# Patient Record
Sex: Female | Born: 1945 | Race: White | Hispanic: No | Smoking: Former smoker
Health system: Southern US, Community
[De-identification: ages and names within clinical notes are randomized; demographics above are authoritative.]

## PROBLEM LIST (undated history)

## (undated) DIAGNOSIS — Z780 Asymptomatic menopausal state: Secondary | ICD-10-CM

## (undated) DIAGNOSIS — R32 Unspecified urinary incontinence: Secondary | ICD-10-CM

## (undated) DIAGNOSIS — E039 Hypothyroidism, unspecified: Secondary | ICD-10-CM

## (undated) DIAGNOSIS — G459 Transient cerebral ischemic attack, unspecified: Secondary | ICD-10-CM

## (undated) DIAGNOSIS — F329 Major depressive disorder, single episode, unspecified: Secondary | ICD-10-CM

## (undated) DIAGNOSIS — I10 Essential (primary) hypertension: Secondary | ICD-10-CM

## (undated) DIAGNOSIS — E871 Hypo-osmolality and hyponatremia: Secondary | ICD-10-CM

## (undated) DIAGNOSIS — F419 Anxiety disorder, unspecified: Secondary | ICD-10-CM

## (undated) DIAGNOSIS — M1990 Unspecified osteoarthritis, unspecified site: Secondary | ICD-10-CM

## (undated) DIAGNOSIS — J189 Pneumonia, unspecified organism: Secondary | ICD-10-CM

## (undated) DIAGNOSIS — I951 Orthostatic hypotension: Secondary | ICD-10-CM

## (undated) DIAGNOSIS — N182 Chronic kidney disease, stage 2 (mild): Secondary | ICD-10-CM

## (undated) DIAGNOSIS — I639 Cerebral infarction, unspecified: Secondary | ICD-10-CM

## (undated) DIAGNOSIS — E538 Deficiency of other specified B group vitamins: Secondary | ICD-10-CM

## (undated) DIAGNOSIS — E785 Hyperlipidemia, unspecified: Secondary | ICD-10-CM

## (undated) DIAGNOSIS — F1020 Alcohol dependence, uncomplicated: Secondary | ICD-10-CM

## (undated) DIAGNOSIS — F32A Depression, unspecified: Secondary | ICD-10-CM

## (undated) DIAGNOSIS — R0602 Shortness of breath: Secondary | ICD-10-CM

## (undated) DIAGNOSIS — J449 Chronic obstructive pulmonary disease, unspecified: Secondary | ICD-10-CM

## (undated) DIAGNOSIS — I6529 Occlusion and stenosis of unspecified carotid artery: Secondary | ICD-10-CM

## (undated) DIAGNOSIS — K56609 Unspecified intestinal obstruction, unspecified as to partial versus complete obstruction: Secondary | ICD-10-CM

## (undated) DIAGNOSIS — R51 Headache: Secondary | ICD-10-CM

## (undated) DIAGNOSIS — D649 Anemia, unspecified: Secondary | ICD-10-CM

## (undated) HISTORY — PX: TONSILLECTOMY: SUR1361

## (undated) HISTORY — DX: Hypothyroidism, unspecified: E03.9

## (undated) HISTORY — DX: Unspecified osteoarthritis, unspecified site: M19.90

## (undated) HISTORY — DX: Essential (primary) hypertension: I10

## (undated) HISTORY — DX: Asymptomatic menopausal state: Z78.0

## (undated) HISTORY — DX: Transient cerebral ischemic attack, unspecified: G45.9

## (undated) HISTORY — PX: APPENDECTOMY: SHX54

## (undated) HISTORY — DX: Hyperlipidemia, unspecified: E78.5

## (undated) HISTORY — DX: Deficiency of other specified B group vitamins: E53.8

## (undated) HISTORY — DX: Cerebral infarction, unspecified: I63.9

---

## 1999-07-19 ENCOUNTER — Other Ambulatory Visit: Admission: RE | Admit: 1999-07-19 | Discharge: 1999-07-19 | Payer: Self-pay

## 2000-07-24 ENCOUNTER — Other Ambulatory Visit: Admission: RE | Admit: 2000-07-24 | Discharge: 2000-07-24 | Payer: Self-pay | Admitting: Family Medicine

## 2001-07-30 ENCOUNTER — Other Ambulatory Visit: Admission: RE | Admit: 2001-07-30 | Discharge: 2001-07-30 | Payer: Self-pay | Admitting: Family Medicine

## 2002-09-23 ENCOUNTER — Other Ambulatory Visit: Admission: RE | Admit: 2002-09-23 | Discharge: 2002-09-23 | Payer: Self-pay | Admitting: Family Medicine

## 2003-09-18 ENCOUNTER — Ambulatory Visit (HOSPITAL_COMMUNITY): Admission: RE | Admit: 2003-09-18 | Discharge: 2003-09-18 | Payer: Self-pay | Admitting: Neurology

## 2003-09-18 ENCOUNTER — Encounter: Payer: Self-pay | Admitting: Neurology

## 2003-09-29 ENCOUNTER — Other Ambulatory Visit: Admission: RE | Admit: 2003-09-29 | Discharge: 2003-09-29 | Payer: Self-pay | Admitting: Family Medicine

## 2004-10-04 ENCOUNTER — Other Ambulatory Visit: Admission: RE | Admit: 2004-10-04 | Discharge: 2004-10-04 | Payer: Self-pay | Admitting: Family Medicine

## 2005-10-17 ENCOUNTER — Other Ambulatory Visit: Admission: RE | Admit: 2005-10-17 | Discharge: 2005-10-17 | Payer: Self-pay | Admitting: Family Medicine

## 2006-12-21 ENCOUNTER — Other Ambulatory Visit: Admission: RE | Admit: 2006-12-21 | Discharge: 2006-12-21 | Payer: Self-pay | Admitting: Family Medicine

## 2007-04-12 ENCOUNTER — Other Ambulatory Visit: Admission: RE | Admit: 2007-04-12 | Discharge: 2007-04-12 | Payer: Self-pay | Admitting: Family Medicine

## 2008-12-11 DIAGNOSIS — I639 Cerebral infarction, unspecified: Secondary | ICD-10-CM

## 2008-12-11 HISTORY — DX: Cerebral infarction, unspecified: I63.9

## 2009-02-02 ENCOUNTER — Ambulatory Visit (HOSPITAL_COMMUNITY): Admission: RE | Admit: 2009-02-02 | Discharge: 2009-02-02 | Payer: Self-pay | Admitting: Family Medicine

## 2009-03-04 ENCOUNTER — Encounter (INDEPENDENT_AMBULATORY_CARE_PROVIDER_SITE_OTHER): Payer: Self-pay | Admitting: Neurology

## 2009-03-04 ENCOUNTER — Ambulatory Visit: Payer: Self-pay

## 2011-04-21 ENCOUNTER — Encounter: Payer: Self-pay | Admitting: Family Medicine

## 2012-01-08 ENCOUNTER — Other Ambulatory Visit: Payer: Self-pay | Admitting: Family Medicine

## 2012-01-08 ENCOUNTER — Inpatient Hospital Stay (HOSPITAL_COMMUNITY)
Admission: EM | Admit: 2012-01-08 | Discharge: 2012-01-17 | DRG: 316 | Disposition: A | Discharge status: Home / Self Care | Payer: BC Managed Care – PPO | Source: Ambulatory Visit | Attending: Internal Medicine | Admitting: Internal Medicine

## 2012-01-08 ENCOUNTER — Encounter (HOSPITAL_COMMUNITY): Payer: Self-pay | Admitting: *Deleted

## 2012-01-08 DIAGNOSIS — I10 Essential (primary) hypertension: Secondary | ICD-10-CM | POA: Diagnosis present

## 2012-01-08 DIAGNOSIS — N179 Acute kidney failure, unspecified: Principal | ICD-10-CM | POA: Diagnosis present

## 2012-01-08 DIAGNOSIS — E872 Acidosis, unspecified: Secondary | ICD-10-CM | POA: Diagnosis present

## 2012-01-08 DIAGNOSIS — G459 Transient cerebral ischemic attack, unspecified: Secondary | ICD-10-CM | POA: Diagnosis not present

## 2012-01-08 DIAGNOSIS — F1010 Alcohol abuse, uncomplicated: Secondary | ICD-10-CM | POA: Diagnosis present

## 2012-01-08 DIAGNOSIS — Z23 Encounter for immunization: Secondary | ICD-10-CM

## 2012-01-08 DIAGNOSIS — Z9089 Acquired absence of other organs: Secondary | ICD-10-CM

## 2012-01-08 DIAGNOSIS — F1020 Alcohol dependence, uncomplicated: Secondary | ICD-10-CM | POA: Diagnosis present

## 2012-01-08 DIAGNOSIS — Z888 Allergy status to other drugs, medicaments and biological substances status: Secondary | ICD-10-CM

## 2012-01-08 DIAGNOSIS — R110 Nausea: Secondary | ICD-10-CM | POA: Diagnosis present

## 2012-01-08 DIAGNOSIS — D649 Anemia, unspecified: Secondary | ICD-10-CM | POA: Diagnosis present

## 2012-01-08 DIAGNOSIS — R911 Solitary pulmonary nodule: Secondary | ICD-10-CM

## 2012-01-08 DIAGNOSIS — E538 Deficiency of other specified B group vitamins: Secondary | ICD-10-CM | POA: Diagnosis present

## 2012-01-08 DIAGNOSIS — E039 Hypothyroidism, unspecified: Secondary | ICD-10-CM | POA: Diagnosis present

## 2012-01-08 DIAGNOSIS — E785 Hyperlipidemia, unspecified: Secondary | ICD-10-CM | POA: Diagnosis present

## 2012-01-08 DIAGNOSIS — N19 Unspecified kidney failure: Secondary | ICD-10-CM

## 2012-01-08 DIAGNOSIS — E861 Hypovolemia: Secondary | ICD-10-CM | POA: Diagnosis present

## 2012-01-08 DIAGNOSIS — Z87891 Personal history of nicotine dependence: Secondary | ICD-10-CM

## 2012-01-08 DIAGNOSIS — J22 Unspecified acute lower respiratory infection: Secondary | ICD-10-CM

## 2012-01-08 DIAGNOSIS — E871 Hypo-osmolality and hyponatremia: Secondary | ICD-10-CM | POA: Diagnosis present

## 2012-01-08 DIAGNOSIS — Z8673 Personal history of transient ischemic attack (TIA), and cerebral infarction without residual deficits: Secondary | ICD-10-CM

## 2012-01-08 HISTORY — DX: Depression, unspecified: F32.A

## 2012-01-08 HISTORY — DX: Anxiety disorder, unspecified: F41.9

## 2012-01-08 HISTORY — DX: Headache: R51

## 2012-01-08 HISTORY — DX: Major depressive disorder, single episode, unspecified: F32.9

## 2012-01-08 HISTORY — DX: Shortness of breath: R06.02

## 2012-01-08 HISTORY — DX: Cerebral infarction, unspecified: I63.9

## 2012-01-08 HISTORY — DX: Pneumonia, unspecified organism: J18.9

## 2012-01-08 HISTORY — DX: Anemia, unspecified: D64.9

## 2012-01-08 NOTE — ED Provider Notes (Signed)
History     CSN: [id number]  Arrival date & time 01/08/12  2234   First MD Initiated Contact with Patient 01/08/12 2330      Chief Complaint  Patient presents with  . Pneumonia    (Consider location/radiation/quality/duration/timing/severity/associated sxs/prior treatment)  Patient is a 66 y.o. female presenting with pneumonia. The history is provided by the patient.  Pneumonia  Patient was diagnosed with pneumonia 1 week ago today and started on Levaquin, Hycodan, Albuterol inhaler, and Symbicort. She states that the cough and wheezing has improved since. She is complaining tonight of generalized body aches all over, headache, and insomnia. She denies any nausea, vomiting, and abdominal pain now. She has had some vomiting and nausea at home earlier today. She is unable to take the Hycodan due to nausea and vomiting. Her son is worried that she could be dehydrated due to poor PO intake. She denies fever, sore throat, urinary symptoms, rash, and shortness of breath. She is a former smoker 25+ pack years. She has never had pneumonia before now. She reports alcohol use of wine and liquor daily. She quit drinking 1 week ago when she was diagnosed with pneumonia mostly because she wasn't able to keep anything down. She reports having 4-8 drinks daily, usually vodka and ginger ale.    Past Medical History  Diagnosis Date  . Hypertension   . Menopause   . Hypothyroid   . Mini stroke   . Hyperlipidemia   . B12 deficiency     Past Surgical History  Procedure Date  . Appendectomy   . Tonsillectomy     History reviewed. No pertinent family history.  History  Substance Use Topics  . Smoking status: Not on file  . Smokeless tobacco: Not on file  . Alcohol Use: Not on file    OB History    Grav Para Term Preterm Abortions TAB SAB Ect Mult Living                  Review of Systems All pertinent positives and negatives reviewed in the history of present illness  Allergies    Norvasc and Procardia  Home Medications   Current Outpatient Rx  Name Route Sig Dispense Refill  . ACETAMINOPHEN 325 MG PO TABS Oral Take 325 mg by mouth every 6 (six) hours as needed. For pain    . ALBUTEROL SULFATE HFA 108 (90 BASE) MCG/ACT IN AERS Inhalation Inhale 2 puffs into the lungs 2 (two) times daily as needed. For wheezing    . BUDESONIDE-FORMOTEROL FUMARATE 80-4.5 MCG/ACT IN AERO Inhalation Inhale 1 puff into the lungs daily.    . DULOXETINE HCL 60 MG PO CPEP Oral Take 60 mg by mouth daily.     Marland Kitchen EST ESTROGENS-METHYLTEST 0.625-1.25 MG PO TABS Oral Take 1 tablet by mouth daily.    Marland Kitchen HYDROCODONE-HOMATROPINE 5-1.5 MG/5ML PO SYRP Oral Take 5 mLs by mouth every 6 (six) hours as needed. For cough    . LEVOFLOXACIN 750 MG PO TABS Oral Take 750 mg by mouth daily.    Marland Kitchen LISINOPRIL 40 MG PO TABS Oral Take 40 mg by mouth daily.     Marland Kitchen PROBIOTIC PO Oral Take 1 capsule by mouth 2 (two) times daily.    Marland Kitchen ROSUVASTATIN CALCIUM 20 MG PO TABS Oral Take 20 mg by mouth daily.      BP 146/87  Pulse 94  Temp(Src) 97.8 F (36.6 C) (Oral)  Resp 20  SpO2 100%  Physical Exam  Constitutional:  She is oriented to person, place, and time. She appears well-developed and well-nourished. No distress.  HENT:  Head: Normocephalic and atraumatic.  Eyes: EOM are normal. Pupils are equal, round, and reactive to light.  Neck: Normal range of motion. Neck supple.  Cardiovascular: Normal rate, regular rhythm, normal heart sounds and intact distal pulses.   Pulmonary/Chest: Effort normal and breath sounds normal. No respiratory distress. She has no wheezes.  Abdominal: Soft. Bowel sounds are normal. There is no tenderness.  Genitourinary: Rectum normal. Rectal exam shows no mass and no tenderness. Guaiac negative stool.  Musculoskeletal: Normal range of motion.  Neurological: She is alert and oriented to person, place, and time. She displays tremor.       Fine tremor present  Skin: Skin is warm and dry. She  is not diaphoretic.  Psychiatric: She has a normal mood and affect. Her behavior is normal.    ED Course  Procedures (including critical care time)    Dg Chest 2 View  01/09/2012  *RADIOLOGY REPORT*  Clinical Data: Cough, body pain, weakness, recent pneumonia  CHEST - 2 VIEW  Comparison: None  Findings: Normal heart size, mediastinal contours, and pulmonary vascularity. Emphysematous minimal bronchitic changes. Poorly defined density at lateral right apex may represent pleural thickening or scarring but nodule is not excluded. Remaining lungs clear. No acute infiltrate, pleural effusion or pneumothorax. Bones appear demineralized.  IMPRESSION: Emphysematous and minimal bronchitic changes. No definite acute infiltrate. Question scarring at right apex though nodule is not completely excluded; if the patient has prior outside chest radiographs, these would be of benefit to establish stability of this finding. In the absence of prior studies, follow-up noncontrast CT chest would be recommended to exclude pulmonary nodule.  Original Report Authenticated By: Lollie Marrow, M.D.    The patient was recently some drinking about one week ago where she was having 4-8 drinks per day.  She states she sits nausea and vomiting since that time along with the pneumonia was diagnosed by her primary care doctor.  The patient is stable here in the emergency department as far as her mental status.  He does have some mild shaking noted on exam.  Patient states she has generalized bodyaches.  She denies hallucinations.  She also denies any syncope.  I will give the patient an antibiotic here along with some Ativan to help control these mild symptoms that she is having at this time.    She will need admission to the hospital for multiple abnormalities on her metabolic panel.  She was given the plan and voices an understanding at this time. MDM  MDM Reviewed: nursing note and vitals Interpretation: labs and x-ray      Date: 01/09/2012  Rate: 90  Rhythm: normal sinus rhythm  QRS Axis: normal  Intervals: normal  ST/T Wave abnormalities: normal  Conduction Disutrbances:none  Narrative Interpretation:   Old EKG Reviewed: unchanged          Carlyle Dolly, PA-C 01/09/12 0153  Carlyle Dolly, PA-C 01/09/12 0205

## 2012-01-08 NOTE — ED Notes (Signed)
The pt was diagnosed with pneumonia Monday.  And since then she has been wheezing coughing and vomiting

## 2012-01-09 ENCOUNTER — Other Ambulatory Visit: Payer: Self-pay

## 2012-01-09 ENCOUNTER — Encounter (HOSPITAL_COMMUNITY): Payer: Self-pay | Admitting: Internal Medicine

## 2012-01-09 ENCOUNTER — Inpatient Hospital Stay (HOSPITAL_COMMUNITY): Payer: BC Managed Care – PPO

## 2012-01-09 ENCOUNTER — Emergency Department (HOSPITAL_COMMUNITY): Payer: BC Managed Care – PPO

## 2012-01-09 DIAGNOSIS — E538 Deficiency of other specified B group vitamins: Secondary | ICD-10-CM | POA: Diagnosis present

## 2012-01-09 DIAGNOSIS — D649 Anemia, unspecified: Secondary | ICD-10-CM | POA: Diagnosis present

## 2012-01-09 DIAGNOSIS — G459 Transient cerebral ischemic attack, unspecified: Secondary | ICD-10-CM | POA: Diagnosis not present

## 2012-01-09 DIAGNOSIS — E872 Acidosis, unspecified: Secondary | ICD-10-CM | POA: Diagnosis present

## 2012-01-09 DIAGNOSIS — E861 Hypovolemia: Secondary | ICD-10-CM | POA: Diagnosis present

## 2012-01-09 DIAGNOSIS — F1020 Alcohol dependence, uncomplicated: Secondary | ICD-10-CM | POA: Diagnosis present

## 2012-01-09 DIAGNOSIS — E785 Hyperlipidemia, unspecified: Secondary | ICD-10-CM | POA: Diagnosis present

## 2012-01-09 DIAGNOSIS — E871 Hypo-osmolality and hyponatremia: Secondary | ICD-10-CM

## 2012-01-09 DIAGNOSIS — I10 Essential (primary) hypertension: Secondary | ICD-10-CM | POA: Diagnosis present

## 2012-01-09 DIAGNOSIS — N179 Acute kidney failure, unspecified: Secondary | ICD-10-CM | POA: Diagnosis present

## 2012-01-09 HISTORY — DX: Hypo-osmolality and hyponatremia: E87.1

## 2012-01-09 HISTORY — DX: Alcohol dependence, uncomplicated: F10.20

## 2012-01-09 LAB — CBC
HCT: 30.5 % — ABNORMAL LOW (ref 36.0–46.0)
HCT: 34.4 % — ABNORMAL LOW (ref 36.0–46.0)
Hemoglobin: 11.2 g/dL — ABNORMAL LOW (ref 12.0–15.0)
Hemoglobin: 12.6 g/dL (ref 12.0–15.0)
MCH: 36.2 pg — ABNORMAL HIGH (ref 26.0–34.0)
MCH: 36.4 pg — ABNORMAL HIGH (ref 26.0–34.0)
MCHC: 36.6 g/dL — ABNORMAL HIGH (ref 30.0–36.0)
MCHC: 36.7 g/dL — ABNORMAL HIGH (ref 30.0–36.0)
MCV: 98.9 fL (ref 78.0–100.0)
MCV: 99 fL (ref 78.0–100.0)
Platelets: 114 10*3/uL — ABNORMAL LOW (ref 150–400)
Platelets: 165 10*3/uL (ref 150–400)
RBC: 3.08 MIL/uL — ABNORMAL LOW (ref 3.87–5.11)
RBC: 3.48 MIL/uL — ABNORMAL LOW (ref 3.87–5.11)
RDW: 15.1 % (ref 11.5–15.5)
RDW: 15.2 % (ref 11.5–15.5)
WBC: 7.1 10*3/uL (ref 4.0–10.5)
WBC: 9.1 10*3/uL (ref 4.0–10.5)

## 2012-01-09 LAB — BASIC METABOLIC PANEL
BUN: 126 mg/dL — ABNORMAL HIGH (ref 6–23)
CO2: 12 mEq/L — ABNORMAL LOW (ref 19–32)
Calcium: 8.8 mg/dL (ref 8.4–10.5)
Chloride: 91 mEq/L — ABNORMAL LOW (ref 96–112)
Creatinine, Ser: 18.07 mg/dL — ABNORMAL HIGH (ref 0.50–1.10)
GFR calc Af Amer: 2 mL/min — ABNORMAL LOW (ref >90)
GFR calc non Af Amer: 2 mL/min — ABNORMAL LOW (ref >90)
Glucose, Bld: 98 mg/dL (ref 70–99)
Potassium: 5.2 mEq/L — ABNORMAL HIGH (ref 3.5–5.1)
Sodium: 128 mEq/L — ABNORMAL LOW (ref 135–145)

## 2012-01-09 LAB — DIFFERENTIAL
Basophils Absolute: 0 10*3/uL (ref 0.0–0.1)
Basophils Absolute: 0 10*3/uL (ref 0.0–0.1)
Basophils Relative: 0 % (ref 0–1)
Basophils Relative: 0 % (ref 0–1)
Eosinophils Absolute: 0 10*3/uL (ref 0.0–0.7)
Eosinophils Absolute: 0.1 10*3/uL (ref 0.0–0.7)
Eosinophils Relative: 1 % (ref 0–5)
Eosinophils Relative: 1 % (ref 0–5)
Lymphocytes Relative: 11 % — ABNORMAL LOW (ref 12–46)
Lymphocytes Relative: 12 % (ref 12–46)
Lymphs Abs: 0.8 10*3/uL (ref 0.7–4.0)
Lymphs Abs: 1 10*3/uL (ref 0.7–4.0)
Monocytes Absolute: 0.4 10*3/uL (ref 0.1–1.0)
Monocytes Absolute: 0.6 10*3/uL (ref 0.1–1.0)
Monocytes Relative: 5 % (ref 3–12)
Monocytes Relative: 7 % (ref 3–12)
Neutro Abs: 5.8 10*3/uL (ref 1.7–7.7)
Neutro Abs: 7.4 10*3/uL (ref 1.7–7.7)
Neutrophils Relative %: 82 % — ABNORMAL HIGH (ref 43–77)
Neutrophils Relative %: 82 % — ABNORMAL HIGH (ref 43–77)

## 2012-01-09 LAB — URINALYSIS, ROUTINE W REFLEX MICROSCOPIC
Bilirubin Urine: NEGATIVE
Glucose, UA: NEGATIVE mg/dL
Ketones, ur: NEGATIVE mg/dL
Leukocytes, UA: NEGATIVE
Nitrite: NEGATIVE
Protein, ur: 30 mg/dL — AB
Specific Gravity, Urine: 1.012 (ref 1.005–1.030)
Urobilinogen, UA: 0.2 mg/dL (ref 0.0–1.0)
pH: 5.5 (ref 5.0–8.0)

## 2012-01-09 LAB — COMPREHENSIVE METABOLIC PANEL
ALT: 11 U/L (ref 0–35)
AST: 19 U/L (ref 0–37)
Albumin: 3.1 g/dL — ABNORMAL LOW (ref 3.5–5.2)
Alkaline Phosphatase: 73 U/L (ref 39–117)
BUN: 122 mg/dL — ABNORMAL HIGH (ref 6–23)
CO2: 13 mEq/L — ABNORMAL LOW (ref 19–32)
Calcium: 7.9 mg/dL — ABNORMAL LOW (ref 8.4–10.5)
Chloride: 97 mEq/L (ref 96–112)
Creatinine, Ser: 17.69 mg/dL — ABNORMAL HIGH (ref 0.50–1.10)
GFR calc Af Amer: 2 mL/min — ABNORMAL LOW (ref >90)
GFR calc non Af Amer: 2 mL/min — ABNORMAL LOW (ref >90)
Glucose, Bld: 91 mg/dL (ref 70–99)
Potassium: 5.2 mEq/L — ABNORMAL HIGH (ref 3.5–5.1)
Sodium: 129 mEq/L — ABNORMAL LOW (ref 135–145)
Total Bilirubin: 0.2 mg/dL — ABNORMAL LOW (ref 0.3–1.2)
Total Protein: 6 g/dL (ref 6.0–8.3)

## 2012-01-09 LAB — POCT I-STAT 3, ART BLOOD GAS (G3+)
Acid-base deficit: 15 mmol/L — ABNORMAL HIGH (ref 0.0–2.0)
Acid-base deficit: 14 mmol/L — ABNORMAL HIGH (ref 0.0–2.0)
Bicarbonate: 10.3 mEq/L — ABNORMAL LOW (ref 20.0–24.0)
Bicarbonate: 11.3 mEq/L — ABNORMAL LOW (ref 20.0–24.0)
O2 Saturation: 98 %
O2 Saturation: 64 %
Patient temperature: 98
Patient temperature: 98
TCO2: 11 mmol/L (ref 0–100)
TCO2: 12 mmol/L (ref 0–100)
pCO2 arterial: 22.4 mmHg — ABNORMAL LOW (ref 35.0–45.0)
pCO2 arterial: 25.9 mmHg — ABNORMAL LOW (ref 35.0–45.0)
pH, Arterial: 7.268 — ABNORMAL LOW (ref 7.350–7.400)
pH, Arterial: 7.247 — ABNORMAL LOW (ref 7.350–7.400)
pO2, Arterial: 107 mmHg — ABNORMAL HIGH (ref 80.0–100.0)
pO2, Arterial: 37 mmHg — CL (ref 80.0–100.0)

## 2012-01-09 LAB — HEPATIC FUNCTION PANEL
ALT: 13 U/L (ref 0–35)
AST: 24 U/L (ref 0–37)
Albumin: 3.8 g/dL (ref 3.5–5.2)
Alkaline Phosphatase: 91 U/L (ref 39–117)
Bilirubin, Direct: 0.1 mg/dL (ref 0.0–0.3)
Indirect Bilirubin: 0.1 mg/dL — ABNORMAL LOW (ref 0.3–0.9)
Total Bilirubin: 0.2 mg/dL — ABNORMAL LOW (ref 0.3–1.2)
Total Protein: 7.4 g/dL (ref 6.0–8.3)

## 2012-01-09 LAB — RENAL FUNCTION PANEL
Albumin: 2.9 g/dL — ABNORMAL LOW (ref 3.5–5.2)
BUN: 115 mg/dL — ABNORMAL HIGH (ref 6–23)
CO2: 19 mEq/L (ref 19–32)
Calcium: 7.5 mg/dL — ABNORMAL LOW (ref 8.4–10.5)
Chloride: 93 mEq/L — ABNORMAL LOW (ref 96–112)
Creatinine, Ser: 16.78 mg/dL — ABNORMAL HIGH (ref 0.50–1.10)
GFR calc Af Amer: 2 mL/min — ABNORMAL LOW (ref >90)
GFR calc non Af Amer: 2 mL/min — ABNORMAL LOW (ref >90)
Glucose, Bld: 100 mg/dL — ABNORMAL HIGH (ref 70–99)
Phosphorus: 7.3 mg/dL — ABNORMAL HIGH (ref 2.3–4.6)
Potassium: 4.7 mEq/L (ref 3.5–5.1)
Sodium: 130 mEq/L — ABNORMAL LOW (ref 135–145)

## 2012-01-09 LAB — CARDIAC PANEL(CRET KIN+CKTOT+MB+TROPI)
CK, MB: 17.7 ng/mL (ref 0.3–4.0)
CK, MB: 16.3 ng/mL (ref 0.3–4.0)
CK, MB: 12.2 ng/mL (ref 0.3–4.0)
Relative Index: 6.5 — ABNORMAL HIGH (ref 0.0–2.5)
Relative Index: 6.2 — ABNORMAL HIGH (ref 0.0–2.5)
Relative Index: 5.4 — ABNORMAL HIGH (ref 0.0–2.5)
Total CK: 274 U/L — ABNORMAL HIGH (ref 7–177)
Total CK: 261 U/L — ABNORMAL HIGH (ref 7–177)
Total CK: 226 U/L — ABNORMAL HIGH (ref 7–177)
Troponin I: <0.3 ng/mL (ref <0.30)
Troponin I: <0.3 ng/mL (ref <0.30)
Troponin I: <0.3 ng/mL (ref <0.30)

## 2012-01-09 LAB — URINE MICROSCOPIC-ADD ON

## 2012-01-09 LAB — SALICYLATE LEVEL: Salicylate Lvl: <2 mg/dL — ABNORMAL LOW (ref 2.8–20.0)

## 2012-01-09 LAB — OSMOLALITY: Osmolality: 311 mOsm/kg — ABNORMAL HIGH (ref 275–300)

## 2012-01-09 LAB — LACTIC ACID, PLASMA: Lactic Acid, Venous: 0.6 mmol/L (ref 0.5–2.2)

## 2012-01-09 LAB — LIPASE, BLOOD: Lipase: 237 U/L — ABNORMAL HIGH (ref 11–59)

## 2012-01-09 LAB — ACETAMINOPHEN LEVEL: Acetaminophen (Tylenol), Serum: <15 ug/mL (ref 10–30)

## 2012-01-09 LAB — MRSA PCR SCREENING: MRSA by PCR: NEGATIVE

## 2012-01-09 LAB — PHOSPHORUS: Phosphorus: 7.1 mg/dL — ABNORMAL HIGH (ref 2.3–4.6)

## 2012-01-09 LAB — TSH: TSH: 68.05 u[IU]/mL — ABNORMAL HIGH (ref 0.350–4.500)

## 2012-01-09 MED ORDER — PNEUMOCOCCAL VAC POLYVALENT 25 MCG/0.5ML IJ INJ
0.5000 mL | INJECTION | INTRAMUSCULAR | Status: AC
  Administered 2012-01-10: 0.5 mL via INTRAMUSCULAR
  Filled 2012-01-09: qty 0.5

## 2012-01-09 MED ORDER — METOPROLOL TARTRATE 25 MG PO TABS
25.0000 mg | ORAL_TABLET | Freq: Two times a day (BID) | ORAL | Status: DC
  Administered 2012-01-09 – 2012-01-17 (×17): 25 mg via ORAL
  Filled 2012-01-09 (×18): qty 1

## 2012-01-09 MED ORDER — THIAMINE HCL 100 MG/ML IJ SOLN
100.0000 mg | Freq: Every day | INTRAMUSCULAR | Status: DC
  Filled 2012-01-09 (×3): qty 1

## 2012-01-09 MED ORDER — SODIUM CHLORIDE 0.9 % IV BOLUS (SEPSIS)
1000.0000 mL | Freq: Once | INTRAVENOUS | Status: AC
  Administered 2012-01-09: 1000 mL via INTRAVENOUS

## 2012-01-09 MED ORDER — SODIUM BICARBONATE 8.4 % IV SOLN
INTRAVENOUS | Status: DC
  Administered 2012-01-09 – 2012-01-11 (×4): via INTRAVENOUS
  Filled 2012-01-09 (×10): qty 150

## 2012-01-09 MED ORDER — SODIUM BICARBONATE 8.4 % IV SOLN: INTRAVENOUS | Status: DC

## 2012-01-09 MED ORDER — MORPHINE SULFATE 4 MG/ML IJ SOLN
4.0000 mg | Freq: Once | INTRAMUSCULAR | Status: AC
  Administered 2012-01-09: 4 mg via INTRAVENOUS
  Filled 2012-01-09: qty 1

## 2012-01-09 MED ORDER — ONDANSETRON HCL 4 MG PO TABS
4.0000 mg | ORAL_TABLET | Freq: Four times a day (QID) | ORAL | Status: DC | PRN
  Administered 2012-01-15: 4 mg via ORAL
  Filled 2012-01-09: qty 1

## 2012-01-09 MED ORDER — LORAZEPAM 2 MG/ML IJ SOLN
0.0000 mg | Freq: Two times a day (BID) | INTRAMUSCULAR | Status: AC
  Administered 2012-01-10: 1 mg via INTRAVENOUS

## 2012-01-09 MED ORDER — LORAZEPAM 1 MG PO TABS
1.0000 mg | ORAL_TABLET | Freq: Four times a day (QID) | ORAL | Status: AC | PRN
  Administered 2012-01-10 – 2012-01-11 (×2): 1 mg via ORAL
  Filled 2012-01-09 (×2): qty 1

## 2012-01-09 MED ORDER — WHITE PETROLATUM GEL
Status: AC
  Administered 2012-01-09: 18:00:00
  Filled 2012-01-09: qty 5

## 2012-01-09 MED ORDER — ADULT MULTIVITAMIN W/MINERALS CH
1.0000 | ORAL_TABLET | Freq: Every day | ORAL | Status: DC
  Administered 2012-01-09 – 2012-01-17 (×9): 1 via ORAL
  Filled 2012-01-09 (×9): qty 1

## 2012-01-09 MED ORDER — ACETAMINOPHEN 325 MG PO TABS
650.0000 mg | ORAL_TABLET | Freq: Four times a day (QID) | ORAL | Status: DC | PRN
  Administered 2012-01-13 – 2012-01-17 (×4): 650 mg via ORAL
  Filled 2012-01-09 (×4): qty 2

## 2012-01-09 MED ORDER — ONDANSETRON HCL 4 MG/2ML IJ SOLN
4.0000 mg | Freq: Once | INTRAMUSCULAR | Status: AC
  Administered 2012-01-09: 4 mg via INTRAVENOUS
  Filled 2012-01-09: qty 2

## 2012-01-09 MED ORDER — ACETAMINOPHEN 650 MG RE SUPP: 650.0000 mg | Freq: Four times a day (QID) | RECTAL | Status: DC | PRN

## 2012-01-09 MED ORDER — LEVOTHYROXINE SODIUM 25 MCG PO TABS
25.0000 ug | ORAL_TABLET | Freq: Every day | ORAL | Status: DC
  Administered 2012-01-09 – 2012-01-11 (×3): 25 ug via ORAL
  Filled 2012-01-09 (×5): qty 1

## 2012-01-09 MED ORDER — VITAMIN B-1 100 MG PO TABS
100.0000 mg | ORAL_TABLET | Freq: Every day | ORAL | Status: DC
  Administered 2012-01-09 – 2012-01-17 (×9): 100 mg via ORAL
  Filled 2012-01-09 (×9): qty 1

## 2012-01-09 MED ORDER — M.V.I. ADULT IV INJ
INJECTION | Freq: Once | INTRAVENOUS | Status: AC
  Administered 2012-01-09: 02:00:00 via INTRAVENOUS
  Filled 2012-01-09: qty 1000

## 2012-01-09 MED ORDER — LORAZEPAM 2 MG/ML IJ SOLN
1.0000 mg | Freq: Four times a day (QID) | INTRAMUSCULAR | Status: AC | PRN
  Administered 2012-01-09: 12:00:00 via INTRAVENOUS
  Filled 2012-01-09 (×3): qty 1

## 2012-01-09 MED ORDER — ONDANSETRON HCL 4 MG/2ML IJ SOLN
4.0000 mg | Freq: Four times a day (QID) | INTRAMUSCULAR | Status: DC | PRN
  Administered 2012-01-10 – 2012-01-14 (×6): 4 mg via INTRAVENOUS
  Filled 2012-01-09 (×8): qty 2

## 2012-01-09 MED ORDER — LORAZEPAM 2 MG/ML IJ SOLN
1.0000 mg | Freq: Once | INTRAMUSCULAR | Status: AC
  Administered 2012-01-09: 1 mg via INTRAVENOUS
  Filled 2012-01-09: qty 1

## 2012-01-09 MED ORDER — LORAZEPAM 2 MG/ML IJ SOLN
0.0000 mg | Freq: Four times a day (QID) | INTRAMUSCULAR | Status: AC
  Administered 2012-01-09: 1 mg via INTRAVENOUS
  Filled 2012-01-09: qty 1

## 2012-01-09 MED ORDER — FOLIC ACID 1 MG PO TABS
1.0000 mg | ORAL_TABLET | Freq: Every day | ORAL | Status: DC
  Administered 2012-01-09 – 2012-01-17 (×9): 1 mg via ORAL
  Filled 2012-01-09 (×10): qty 1

## 2012-01-09 NOTE — H&P (Signed)
[name] is an 66 y.o. female.   PCP - Dr.Donal Gross Constant at [facility]. Chief Complaint: Generalized weakness. HPI: 66 year-old female with history of hypertension, hypothyroidism, TIA and alcoholism was brought to the ER with patient's son as patient was to be increasingly weak not able to walk even small distance. Patient has been having nausea vomiting unable to take in anything over the last for 5 days. Patient does not have any abdominal pain or diarrhea. Patient had cough and fever over a month ago and had gone to her PCP who prescribed her some antitussives and Zithromax. Again last week she went back to PCP because of the similar complaints and was placed on Levaquin. After taking Levaquin for 2 days patient had the intractable nausea and vomiting. Patient's son also had stomach flu. Patient denies any chest pain shortness of breath loss of consciousness. Patient drinks alcohol everyday but not for last 4 days. Patient denies having take any salicylates or any other type of alcohol containing products. In the ER patient was found to be in acute renal failure with creatinine of 18 and bicarbonate of 12 with anion gap acidosis. ABG confirms the acidosis. Patient will be admitted for further management.  Past Medical History  Diagnosis Date  . Hypertension   . Menopause   . Hypothyroid   . Mini stroke   . Hyperlipidemia   . B12 deficiency     Past Surgical History  Procedure Date  . Appendectomy   . Tonsillectomy     History reviewed. No pertinent family history. Social History:  reports that she has quit smoking. She does not have any smokeless tobacco history on file. She reports that she drinks alcohol. She reports that she does not use illicit drugs.  Allergies:  Allergies  Allergen Reactions  . Norvasc (Amlodipine Besylate) Hives  . Procardia (Nifedipine)     Edema      Medications Prior to Admission  Medication Dose Route Frequency Provider Last  Rate Last Dose  . LORazepam (ATIVAN) injection 1 mg  1 mg Intravenous Once Kelsey Orleans Lawyer, PA-C   1 mg at 01/09/12 0129  . morphine 4 MG/ML injection 4 mg  4 mg Intravenous Once Kelsey Orleans Lawyer, PA-C   4 mg at 01/09/12 0104  . ondansetron (ZOFRAN) injection 4 mg  4 mg Intravenous Once Kelsey Orleans Lawyer, PA-C   4 mg at 01/09/12 0129  . sodium chloride 0.9 % 1,000 mL with thiamine 100 mg, folic acid 1 mg, multivitamins adult 10 mL infusion   Intravenous Once Kelsey Orleans Lawyer, PA-C 125 mL/hr at 01/09/12 0130    . sodium chloride 0.9 % bolus 1,000 mL  1,000 mL Intravenous Once Kelsey Orleans Lawyer, PA-C   1,000 mL at 01/09/12 0011   Medications Prior to Admission  Medication Sig Dispense Refill  . DULoxetine (CYMBALTA) 60 MG capsule Take 60 mg by mouth daily.       Marland Kitchen lisinopril (PRINIVIL,ZESTRIL) 40 MG tablet Take 40 mg by mouth daily.         Results for orders placed during the hospital encounter of 01/08/12 (from the past 48 hour(s))  CBC     Status: Abnormal   Collection Time   01/09/12 12:03 AM      Component Value Range Comment   WBC 9.1  4.0 - 10.5 (K/uL)    RBC 3.48 (*) 3.87 - 5.11 (MIL/uL)    Hemoglobin 12.6  12.0 - 15.0 (g/dL)  HCT 34.4 (*) 36.0 - 46.0 (%)    MCV 98.9  78.0 - 100.0 (fL)    MCH 36.2 (*) 26.0 - 34.0 (pg)    MCHC 36.6 (*) 30.0 - 36.0 (g/dL)    RDW 16.1  09.6 - 04.5 (%)    Platelets 165  150 - 400 (K/uL)   DIFFERENTIAL     Status: Abnormal   Collection Time   01/09/12 12:03 AM      Component Value Range Comment   Neutrophils Relative 82 (*) 43 - 77 (%)    Neutro Abs 7.4  1.7 - 7.7 (K/uL)    Lymphocytes Relative 11 (*) 12 - 46 (%)    Lymphs Abs 1.0  0.7 - 4.0 (K/uL)    Monocytes Relative 7  3 - 12 (%)    Monocytes Absolute 0.6  0.1 - 1.0 (K/uL)    Eosinophils Relative 1  0 - 5 (%)    Eosinophils Absolute 0.1  0.0 - 0.7 (K/uL)    Basophils Relative 0  0 - 1 (%)    Basophils Absolute 0.0  0.0 - 0.1 (K/uL)   BASIC METABOLIC PANEL     Status:  Abnormal   Collection Time   01/09/12 12:03 AM      Component Value Range Comment   Sodium 128 (*) 135 - 145 (mEq/L)    Potassium 5.2 (*) 3.5 - 5.1 (mEq/L)    Chloride 91 (*) 96 - 112 (mEq/L)    CO2 12 (*) 19 - 32 (mEq/L)    Glucose, Bld 98  70 - 99 (mg/dL)    BUN 409 (*) 6 - 23 (mg/dL)    Creatinine, Ser 81.19 (*) 0.50 - 1.10 (mg/dL)    Calcium 8.8  8.4 - 10.5 (mg/dL)    GFR calc non Af Amer 2 (*) >90 (mL/min)    GFR calc Af Amer 2 (*) >90 (mL/min)   HEPATIC FUNCTION PANEL     Status: Abnormal   Collection Time   01/09/12 12:03 AM      Component Value Range Comment   Total Protein 7.4  6.0 - 8.3 (g/dL)    Albumin 3.8  3.5 - 5.2 (g/dL)    AST 24  0 - 37 (U/L)    ALT 13  0 - 35 (U/L)    Alkaline Phosphatase 91  39 - 117 (U/L)    Total Bilirubin 0.2 (*) 0.3 - 1.2 (mg/dL)    Bilirubin, Direct 0.1  0.0 - 0.3 (mg/dL)    Indirect Bilirubin 0.1 (*) 0.3 - 0.9 (mg/dL)   URINALYSIS, ROUTINE W REFLEX MICROSCOPIC     Status: Abnormal   Collection Time   01/09/12 12:58 AM      Component Value Range Comment   Color, Urine YELLOW  YELLOW     APPearance CLEAR  CLEAR     Specific Gravity, Urine 1.012  1.005 - 1.030     pH 5.5  5.0 - 8.0     Glucose, UA NEGATIVE  NEGATIVE (mg/dL)    Hgb urine dipstick LARGE (*) NEGATIVE     Bilirubin Urine NEGATIVE  NEGATIVE     Ketones, ur NEGATIVE  NEGATIVE (mg/dL)    Protein, ur 30 (*) NEGATIVE (mg/dL)    Urobilinogen, UA 0.2  0.0 - 1.0 (mg/dL)    Nitrite NEGATIVE  NEGATIVE     Leukocytes, UA NEGATIVE  NEGATIVE    URINE MICROSCOPIC-ADD ON     Status: Abnormal   Collection Time  01/09/12 12:58 AM      Component Value Range Comment   Squamous Epithelial / LPF MANY (*) RARE     WBC, UA 0-2  <3 (WBC/hpf)    RBC / HPF 7-10  <3 (RBC/hpf)    Bacteria, UA FEW (*) RARE     Casts GRANULAR CAST (*) NEGATIVE    POCT I-STAT 3, BLOOD GAS (G3+)     Status: Abnormal   Collection Time   01/09/12  3:18 AM      Component Value Range Comment   pH, Arterial 7.268 (*)  7.350 - 7.400     pCO2 arterial 22.4 (*) 35.0 - 45.0 (mmHg)    pO2, Arterial 107.0 (*) 80.0 - 100.0 (mmHg)    Bicarbonate 10.3 (*) 20.0 - 24.0 (mEq/L)    TCO2 11  0 - 100 (mmol/L)    O2 Saturation 98.0      Acid-base deficit 15.0 (*) 0.0 - 2.0 (mmol/L)    Patient temperature 98.0 F      Collection site RADIAL, ALLEN'S TEST ACCEPTABLE      Drawn by RT      Sample type ARTERIAL      Dg Chest 2 View  01/09/2012  *RADIOLOGY REPORT*  Clinical Data: Cough, body pain, weakness, recent pneumonia  CHEST - 2 VIEW  Comparison: None  Findings: Normal heart size, mediastinal contours, and pulmonary vascularity. Emphysematous minimal bronchitic changes. Poorly defined density at lateral right apex may represent pleural thickening or scarring but nodule is not excluded. Remaining lungs clear. No acute infiltrate, pleural effusion or pneumothorax. Bones appear demineralized.  IMPRESSION: Emphysematous and minimal bronchitic changes. No definite acute infiltrate. Question scarring at right apex though nodule is not completely excluded; if the patient has prior outside chest radiographs, these would be of benefit to establish stability of this finding. In the absence of prior studies, follow-up noncontrast CT chest would be recommended to exclude pulmonary nodule.  Original Report Authenticated By: Lollie Marrow, M.D.    Review of Systems  Constitutional: Positive for malaise/fatigue.  HENT: Negative.   Eyes: Negative.   Respiratory: Negative.   Cardiovascular: Negative.   Gastrointestinal: Positive for nausea and vomiting.  Genitourinary: Negative.   Musculoskeletal: Negative.   Skin: Negative.   Neurological: Positive for weakness.  Endo/Heme/Allergies: Negative.   Psychiatric/Behavioral: Negative.     Blood pressure 152/79, pulse 86, temperature 97.9 F (36.6 C), temperature source Oral, resp. rate 18, SpO2 100.00%. Physical Exam  Constitutional: She is oriented to person, place, and time. She  appears well-developed. No distress.  HENT:  Head: Normocephalic and atraumatic.  Right Ear: External ear normal.  Left Ear: External ear normal.  Nose: Nose normal.  Mouth/Throat: Oropharynx is clear and moist. No oropharyngeal exudate.  Eyes: Conjunctivae and EOM are normal. Pupils are equal, round, and reactive to light. Right eye exhibits no discharge. Left eye exhibits no discharge. No scleral icterus.  Neck: Normal range of motion. Neck supple.  Cardiovascular: Normal rate, regular rhythm and normal heart sounds.   Respiratory: Effort normal and breath sounds normal. No respiratory distress. She has no wheezes. She has no rales.  GI: Soft. Bowel sounds are normal. She exhibits no distension. There is no tenderness. There is no rebound.  Musculoskeletal: Normal range of motion. She exhibits no edema and no tenderness.  Neurological: She is alert and oriented to person, place, and time.       Moves upper and lower extremities. Has fine tremors. Good reflexes.No facial asymmetry.  Skin: Skin is warm and dry. No rash noted. She is not diaphoretic. No erythema.  Psychiatric: Her behavior is normal.     Assessment/Plan #1. Acute renal failure (non-oliguric) - at this time the most likely cause of be pre renal secondary to her persistent nausea vomiting with lisinopril in addition taken. Patient is making urine. Patient is urine does not show any casts. We will closely monitor intake output place patient on Foley. Get renal sonogram. Check urine sodium creatinine, urine eosinophils. Check salicylate and lactic acid levels. Check CPK for rhabdomyolysis. At this time patient be placed on bicarbonate drip and recheck metabolic panel a few hours. I did counsel Dr. Lowell Guitar nephrologist on call. They will be seeing patient in consult. We will hold off her diuretics, lisinopril and statins for now. #2. Anion gap Metabolic acidosis - probably from #1 reason and also her nausea vomiting. We will be  checking salicylate levels and lactic acid. #3. Nausea vomiting - probably from viral causes. #4. Alcoholism - patient will be placed on CIWA protocol. #5. History of hypertension, hyperlipidemia, TIA. #6. Abnormal chest x-ray with history of cigarette smoking will need a repeat chest x-ray or a CT chest with her primary care within a month's time.  CODE STATUS - full code.  Rachel Rison N. 01/09/2012, 3:50 AM

## 2012-01-09 NOTE — Progress Notes (Signed)
[facility]   Subjective: Patient alert and without any specific complaints. Previous nausea has resolved although she still has some mild abdominal cramping. States does not normally have fasciculations of her face and she is currently experiencing.  Objective: Vital signs in last 24 hours: Temp:  [97.3 F (36.3 C)-98.3 F (36.8 C)] 98.1 F (36.7 C) (01/29 1200) Pulse Rate:  [82-94] 94  (01/29 1342) Resp:  [11-20] 13  (01/29 0520) BP: (131-165)/(70-87) 146/87 mmHg (01/29 1342) SpO2:  [97 %-100 %] 100 % (01/29 0520) Weight:  [74.4 kg (164 lb 0.4 oz)] 74.4 kg (164 lb 0.4 oz) (01/29 0445) Weight change:     Intake/Output from previous day: 01/28 0701 - 01/29 0700 In: 500 [I.V.:500] Out: -  Intake/Output this shift: Total I/O In: 360 [P.O.:360] Out: 275 [Urine:275]  General appearance: alert, cooperative, appears stated age and mild distress Resp: clear to auscultation bilaterally, room air oxygen saturations 100% Cardio: regular rate and rhythm, S1, S2 normal, no murmur, click, rub or gallop, bicarbonate drip infusing at 125 cc per hour GI: soft, non-tender; bowel sounds normal; no masses,  no organomegaly Extremities: extremities normal, atraumatic, no cyanosis or edema Neurologic: Grossly normal except for mild fasciculations of the face bilaterally. No obvious extremity tremors or asterixis.  Lab Results:  Basename 01/09/12 0510 01/09/12 0003  WBC 7.1 9.1  HGB 11.2* 12.6  HCT 30.5* 34.4*  PLT 114* 165   BMET  Basename 01/09/12 0510 01/09/12 0003  NA 129* 128*  K 5.2* 5.2*  CL 97 91*  CO2 13* 12*  GLUCOSE 91 98  BUN 122* 126*  CREATININE 17.69* 18.07*  CALCIUM 7.9* 8.8    Studies/Results: Dg Chest 2 View  01/09/2012  *RADIOLOGY REPORT*  Clinical Data: Cough, body pain, weakness, recent pneumonia  CHEST - 2 VIEW  Comparison: None  Findings: Normal heart size, mediastinal contours, and pulmonary vascularity. Emphysematous minimal bronchitic changes.  Poorly defined density at lateral right apex may represent pleural thickening or scarring but nodule is not excluded. Remaining lungs clear. No acute infiltrate, pleural effusion or pneumothorax. Bones appear demineralized.  IMPRESSION: Emphysematous and minimal bronchitic changes. No definite acute infiltrate. Question scarring at right apex though nodule is not completely excluded; if the patient has prior outside chest radiographs, these would be of benefit to establish stability of this finding. In the absence of prior studies, follow-up noncontrast CT chest would be recommended to exclude pulmonary nodule.  Original Report Authenticated By: Lollie Marrow, M.D.   US Renal  01/09/2012  *RADIOLOGY REPORT*  Clinical Data: History of acute renal failure.  RENAL/URINARY TRACT ULTRASOUND COMPLETE  Comparison:  None.  Findings:  Right Kidney:  Right renal length is 12.4 cm.  Left Kidney:  Left renal length is 11.8 cm.  Examination of each kidney shows no evidence of hydronephrosis, solid or cystic mass, calculus, parenchymal loss, or parenchymal textural abnormality.  Bladder:  No urinary bladder abnormality is evident.  IMPRESSION: No renal abnormality is demonstrated by ultrasound.  Original Report Authenticated By: Crawford Givens, M.D.    Medications:  I have reviewed the patient's current medications. Scheduled:   . folic acid  1 mg Oral Daily  . levothyroxine  25 mcg Oral QAC breakfast  . LORazepam  0-4 mg Intravenous Q6H   Followed by  . LORazepam  0-4 mg Intravenous Q12H  . LORazepam  1 mg Intravenous Once  . metoprolol tartrate  25 mg Oral BID  .  morphine injection  4 mg Intravenous  Once  . mulitivitamin with minerals  1 tablet Oral Daily  . ondansetron (ZOFRAN) IV  4 mg Intravenous Once  . pneumococcal 23 valent vaccine  0.5 mL Intramuscular Tomorrow-1000  . banana bag IV fluid 1000 mL   Intravenous Once  . sodium chloride  1,000 mL Intravenous Once  . thiamine  100 mg Oral Daily   Or  .  thiamine  100 mg Intravenous Daily    Assessment/Plan:  Principal Problem:  *Acute renal failure/ Metabolic acidosis/Hyponatremia Appreciate renal assistance. He'll this point the primary etiology his ATN secondary to dehydration from gastrointestinal illness in the setting of ongoing use of diuretic and ACE inhibitor prior to admission. Patient's blood pressure is in the hypertensive range but renal wishes to continue aggressive hydration at this point. Will follow electrolyte panel daily. Urine output is adequate-baseline creatinine unknown. 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 Watch for hypokalemia with utilization of IV bicarbonate infusion.  Active Problems:  Hypovolemia dehydration Still euvolemic based on renal function. See above.    HTN (hypertension) Have started metoprolol 25 mg by mouth twice a day since patient is unable to take her normal medications from home because of acute renal failure.   Anemia/ B12 deficiency Hemoglobin stable. Continue vitamin supplements.   Hyperlipidemia  home statin on hold due to acute renal failure.   History of TIA (transient ischemic attack) Currently no symptoms. Was on Plavix prior to admission. Will need to hold until renal function improves.   Alcoholism Continue CIWA protocol   Disposition Remain in the step down    LOS: 1 day   Junious Silk, ANP pager [phone]  [facility] 8 [email or web address] Password: [email or web address]  01/09/2012, 2:49 PM  I have examined the patient and reviewed the chart. I have modified the above note and agree with it.   Calvert Cantor, MD [phone]

## 2012-01-09 NOTE — Consult Note (Signed)
I have seen and examined this patient and agree with the plan of care. Will continue to monitor anticipate return of renal function. Significant alcohol abuse will need to be addressed.  [name] 01/09/2012, 11:41 AM

## 2012-01-09 NOTE — ED Notes (Signed)
RT called for blood gas

## 2012-01-09 NOTE — ED Notes (Signed)
PT reports he shaking is better

## 2012-01-09 NOTE — Progress Notes (Signed)
SPOKE WITH PT AND FAMILY ABOUT DC NEEDS, AT THIS TIME THEY HAVE NONE.  PT IS SHOWING IN EPIC AS HAVING NO INSURANCE.  FAMILY PROVIDED PROOF OF INSURANCE AND A COPY WAS FAXED TO THE INSURANCE AUTH DEPARTMENT.  WILL F/U.  Willa Rough [phone] OR [phone] 01/09/2012

## 2012-01-09 NOTE — Consult Note (Signed)
Reason for Consult:ARF Referring Physician: Dr. Bettey Costa is an 66 y.o. female with history of HTN, hypothyroidism presenting with weakness, nausea, vomiting for past 5-6 days. This follows treatment for bronchitis and pneumonia over past 2-3 weeks. Started course of levaquin last Monday. Pt unable to tolerate foods and liquids essentially for past 3 days, began having extreme muscle weakness and myalgias 2 days ago. Noticed significant decline in UOP. Only OTC medication has been tylenol. Has continued trying to take home meds including ACEi. Denies any recent chest pain, dyspnea, LE edema, abdominal pain, dysuria, hematuria, diarrhea, rash, hypotension, syncope.   On arrival to [facility], found to have metabolic acidosis with AG 25 and ARF with cr >18, BUN 122. Started on bicarbonate infusion. ACEi has been held. Since admission and initiation of IVF overnight, patient is feeling slightly improved, no emesis noted. Slight improvement in cr already. Has urinated in the commode this morning, normal appearance per patient.     PMH:   Past Medical History  Diagnosis Date  . Hypertension   . Menopause   . Hypothyroid   . Mini stroke   . Hyperlipidemia   . B12 deficiency   . Shortness of breath   . Stroke   . Headache   . Anxiety   . Pneumonia   . Anemia   . Depression     PSH:   Past Surgical History  Procedure Date  . Appendectomy   . Tonsillectomy     Allergies:  Allergies  Allergen Reactions  . Norvasc (Amlodipine Besylate) Hives  . Procardia (Nifedipine)     Edema      Medications:   Prior to Admission medications   Medication Sig Start Date End Date Taking? Authorizing Provider  acetaminophen (TYLENOL) 325 MG tablet Take 325 mg by mouth every 6 (six) hours as needed. For pain   Yes Historical Provider, MD  albuterol (PROVENTIL HFA;VENTOLIN HFA) 108 (90 BASE) MCG/ACT inhaler Inhale 2 puffs into the lungs 2 (two) times daily as needed. For wheezing   Yes  Historical Provider, MD  budesonide-formoterol (SYMBICORT) 80-4.5 MCG/ACT inhaler Inhale 1 puff into the lungs daily.   Yes Historical Provider, MD  DULoxetine (CYMBALTA) 60 MG capsule Take 60 mg by mouth daily.    Yes Historical Provider, MD  estrogen-methylTESTOSTERone (ESTRATEST HS) 0.625-1.25 MG per tablet Take 1 tablet by mouth daily.   Yes Historical Provider, MD  HYDROcodone-homatropine (HYCODAN) 5-1.5 MG/5ML syrup Take 5 mLs by mouth every 6 (six) hours as needed. For cough   Yes Historical Provider, MD  levofloxacin (LEVAQUIN) 750 MG tablet Take 750 mg by mouth daily.   Yes Historical Provider, MD  lisinopril (PRINIVIL,ZESTRIL) 40 MG tablet Take 40 mg by mouth daily.    Yes Historical Provider, MD  Probiotic Product (PROBIOTIC PO) Take 1 capsule by mouth 2 (two) times daily.   Yes Historical Provider, MD  rosuvastatin (CRESTOR) 20 MG tablet Take 20 mg by mouth daily.   Yes Historical Provider, MD    Discontinued Meds:   Medications Discontinued During This Encounter  Medication Reason  . amLODipine (NORVASC) 5 MG tablet   . clopidogrel (PLAVIX) 75 MG tablet   . simvastatin (ZOCOR) 40 MG tablet   . hydrochlorothiazide 25 MG tablet   . levothyroxine (SYNTHROID, LEVOTHROID) 25 MCG tablet   . dextrose 5 % 1,000 mL with sodium bicarbonate 150 mEq infusion Entry Error      Family History:  History reviewed. No pertinent family history.  Social History:  reports that she has quit smoking. She does not have any smokeless tobacco history on file. She reports that she drinks about 14 ounces of alcohol per week. She reports that she does not use illicit drugs.   Blood pressure 161/85, pulse 82, temperature 97.3 F (36.3 C), temperature source Oral, resp. rate 13, height 5\' 9"  (1.753 m), weight 164 lb 0.4 oz (74.4 kg), SpO2 100.00%.  Physical Examination: General appearance - acyanotic, in no respiratory distress and ill-appearing Mental status - alert, oriented to person, place, and  time, normal mood, behavior, speech, dress, motor activity, and thought processes Eyes - pupils equal and reactive, extraocular eye movements intact, sclera anicteric Mouth - mucous membranes moist, pharynx normal without lesions Chest - clear to auscultation, no wheezes, rales or rhonchi, symmetric air entry Heart - normal rate, regular rhythm, normal S1, S2, no murmurs, rubs, clicks or gallops Abdomen - soft, nontender, nondistended, no masses or organomegaly Neurological - alert, oriented, normal speech, mild hand asterixis.  Extremities - peripheral pulses normal, no pedal edema, no clubbing or cyanosis Skin - normal coloration and turgor, no rashes, no suspicious skin lesions noted  Creatinine, Ser  Date/Time Value Range Status  01/09/2012  5:10 AM 17.69* 0.50-1.10 (mg/dL) Final  03/19/8118 14:78 AM 18.07* 0.50-1.10 (mg/dL) Final    Results for orders placed during the hospital encounter of 01/08/12 (from the past 48 hour(s))  CBC     Status: Abnormal   Collection Time   01/09/12 12:03 AM      Component Value Range Comment   WBC 9.1  4.0 - 10.5 (K/uL)    RBC 3.48 (*) 3.87 - 5.11 (MIL/uL)    Hemoglobin 12.6  12.0 - 15.0 (g/dL)    HCT 29.5 (*) 62.1 - 46.0 (%)    MCV 98.9  78.0 - 100.0 (fL)    MCH 36.2 (*) 26.0 - 34.0 (pg)    MCHC 36.6 (*) 30.0 - 36.0 (g/dL)    RDW 30.8  65.7 - 84.6 (%)    Platelets 165  150 - 400 (K/uL)   DIFFERENTIAL     Status: Abnormal   Collection Time   01/09/12 12:03 AM      Component Value Range Comment   Neutrophils Relative 82 (*) 43 - 77 (%)    Neutro Abs 7.4  1.7 - 7.7 (K/uL)    Lymphocytes Relative 11 (*) 12 - 46 (%)    Lymphs Abs 1.0  0.7 - 4.0 (K/uL)    Monocytes Relative 7  3 - 12 (%)    Monocytes Absolute 0.6  0.1 - 1.0 (K/uL)    Eosinophils Relative 1  0 - 5 (%)    Eosinophils Absolute 0.1  0.0 - 0.7 (K/uL)    Basophils Relative 0  0 - 1 (%)    Basophils Absolute 0.0  0.0 - 0.1 (K/uL)   BASIC METABOLIC PANEL     Status: Abnormal    Collection Time   01/09/12 12:03 AM      Component Value Range Comment   Sodium 128 (*) 135 - 145 (mEq/L)    Potassium 5.2 (*) 3.5 - 5.1 (mEq/L)    Chloride 91 (*) 96 - 112 (mEq/L)    CO2 12 (*) 19 - 32 (mEq/L)    Glucose, Bld 98  70 - 99 (mg/dL)    BUN 962 (*) 6 - 23 (mg/dL)    Creatinine, Ser 95.28 (*) 0.50 - 1.10 (mg/dL)    Calcium 8.8  8.4 - 10.5 (mg/dL)    GFR calc non Af Amer 2 (*) >90 (mL/min)    GFR calc Af Amer 2 (*) >90 (mL/min)   HEPATIC FUNCTION PANEL     Status: Abnormal   Collection Time   01/09/12 12:03 AM      Component Value Range Comment   Total Protein 7.4  6.0 - 8.3 (g/dL)    Albumin 3.8  3.5 - 5.2 (g/dL)    AST 24  0 - 37 (U/L)    ALT 13  0 - 35 (U/L)    Alkaline Phosphatase 91  39 - 117 (U/L)    Total Bilirubin 0.2 (*) 0.3 - 1.2 (mg/dL)    Bilirubin, Direct 0.1  0.0 - 0.3 (mg/dL)    Indirect Bilirubin 0.1 (*) 0.3 - 0.9 (mg/dL)   URINALYSIS, ROUTINE W REFLEX MICROSCOPIC     Status: Abnormal   Collection Time   01/09/12 12:58 AM      Component Value Range Comment   Color, Urine YELLOW  YELLOW     APPearance CLEAR  CLEAR     Specific Gravity, Urine 1.012  1.005 - 1.030     pH 5.5  5.0 - 8.0     Glucose, UA NEGATIVE  NEGATIVE (mg/dL)    Hgb urine dipstick LARGE (*) NEGATIVE     Bilirubin Urine NEGATIVE  NEGATIVE     Ketones, ur NEGATIVE  NEGATIVE (mg/dL)    Protein, ur 30 (*) NEGATIVE (mg/dL)    Urobilinogen, UA 0.2  0.0 - 1.0 (mg/dL)    Nitrite NEGATIVE  NEGATIVE     Leukocytes, UA NEGATIVE  NEGATIVE    URINE MICROSCOPIC-ADD ON     Status: Abnormal   Collection Time   01/09/12 12:58 AM      Component Value Range Comment   Squamous Epithelial / LPF MANY (*) RARE     WBC, UA 0-2  <3 (WBC/hpf)    RBC / HPF 7-10  <3 (RBC/hpf)    Bacteria, UA FEW (*) RARE     Casts GRANULAR CAST (*) NEGATIVE    POCT I-STAT 3, BLOOD GAS (G3+)     Status: Abnormal   Collection Time   01/09/12  2:55 AM      Component Value Range Comment   pH, Arterial 7.247 (*) 7.350 -  7.400     pCO2 arterial 25.9 (*) 35.0 - 45.0 (mmHg)    pO2, Arterial 37.0 (*) 80.0 - 100.0 (mmHg)    Bicarbonate 11.3 (*) 20.0 - 24.0 (mEq/L)    TCO2 12  0 - 100 (mmol/L)    O2 Saturation 64.0      Acid-base deficit 14.0 (*) 0.0 - 2.0 (mmol/L)    Patient temperature 98.0 F      Collection site RADIAL, ALLEN'S TEST ACCEPTABLE      Drawn by RT      Sample type ARTERIAL     POCT I-STAT 3, BLOOD GAS (G3+)     Status: Abnormal   Collection Time   01/09/12  3:18 AM   01/09/12  5:05 AM      Component Value Range Comment   MRSA by PCR NEGATIVE  NEGATIVE    CARDIAC PANEL(CRET KIN+CKTOT+MB+TROPI)     Status: Abnormal   Collection Time   01/09/12  5:10 AM      Component Value Range Comment   Total CK 274 (*) 7 - 177 (U/L)    CK, MB 17.7 (*) 0.3 - 4.0 (ng/mL)  Troponin I <0.30  <0.30 (ng/mL)    Relative Index 6.5 (*) 0.0 - 2.5    SALICYLATE LEVEL     Status: Abnormal   Collection Time   01/09/12  5:10 AM      Component Value Range Comment   Salicylate Lvl <2.0 (*) 2.8 - 20.0 (mg/dL)   ACETAMINOPHEN LEVEL     Status: Normal   Collection Time   01/09/12  5:10 AM      Component Value Range Comment   Acetaminophen (Tylenol), Serum <15.0  10 - 30 (ug/mL)   COMPREHENSIVE METABOLIC PANEL     Status: Abnormal   Collection Time   01/09/12  5:10 AM      Component Value Range Comment   Sodium 129 (*) 135 - 145 (mEq/L)    Potassium 5.2 (*) 3.5 - 5.1 (mEq/L)    Chloride 97  96 - 112 (mEq/L)    CO2 13 (*) 19 - 32 (mEq/L)    Glucose, Bld 91  70 - 99 (mg/dL)    BUN 161 (*) 6 - 23 (mg/dL)    Creatinine, Ser 09.60 (*) 0.50 - 1.10 (mg/dL)    Calcium 7.9 (*) 8.4 - 10.5 (mg/dL)    Total Protein 6.0  6.0 - 8.3 (g/dL)    Albumin 3.1 (*) 3.5 - 5.2 (g/dL)    AST 19  0 - 37 (U/L)    ALT 11  0 - 35 (U/L)    Alkaline Phosphatase 73  39 - 117 (U/L)    Total Bilirubin 0.2 (*) 0.3 - 1.2 (mg/dL)    GFR calc non Af Amer 2 (*) >90 (mL/min)    GFR calc Af Amer 2 (*) >90 (mL/min)   PHOSPHORUS     Status: Abnormal    Collection Time   01/09/12  5:10 AM      Component Value Range Comment   Phosphorus 7.1 (*) 2.3 - 4.6 (mg/dL)   CBC     Status: Abnormal   Collection Time   01/09/12  5:10 AM      Component Value Range Comment   WBC 7.1  4.0 - 10.5 (K/uL)    RBC 3.08 (*) 3.87 - 5.11 (MIL/uL)    Hemoglobin 11.2 (*) 12.0 - 15.0 (g/dL)    HCT 45.4 (*) 09.8 - 46.0 (%)    MCV 99.0  78.0 - 100.0 (fL)    MCH 36.4 (*) 26.0 - 34.0 (pg)    MCHC 36.7 (*) 30.0 - 36.0 (g/dL)    RDW 11.9  14.7 - 82.9 (%)    Platelets 114 (*) 150 - 400 (K/uL)   DIFFERENTIAL     Status: Abnormal   Collection Time   01/09/12  5:10 AM      Component Value Range Comment   Neutrophils Relative 82 (*) 43 - 77 (%)    Neutro Abs 5.8  1.7 - 7.7 (K/uL)    Lymphocytes Relative 12  12 - 46 (%)    Lymphs Abs 0.8  0.7 - 4.0 (K/uL)    Monocytes Relative 5  3 - 12 (%)    Monocytes Absolute 0.4  0.1 - 1.0 (K/uL)    Eosinophils Relative 1  0 - 5 (%)    Eosinophils Absolute 0.0  0.0 - 0.7 (K/uL)    Basophils Relative 0  0 - 1 (%)    Basophils Absolute 0.0  0.0 - 0.1 (K/uL)   OSMOLALITY     Status: Abnormal   Collection Time  01/09/12  5:10 AM      Component Value Range Comment   Osmolality 311 (*) 275 - 300 (mOsm/kg)   TSH     Status: Abnormal   Collection Time   01/09/12  5:10 AM      Component Value Range Comment   TSH 68.050 (*) 0.350 - 4.500 (uIU/mL)   LACTIC ACID, PLASMA     Status: Normal   Collection Time   01/09/12  5:10 AM      Component Value Range Comment   Lactic Acid, Venous 0.6  0.5 - 2.2 (mmol/L)   LIPASE, BLOOD     Status: Abnormal   Collection Time   01/09/12  5:10 AM      Component Value Range Comment   Lipase 237 (*) 11 - 59 (U/L)     Dg Chest 2 View  01/09/2012  *RADIOLOGY REPORT*  Clinical Data: Cough, body pain, weakness, recent pneumonia  CHEST - 2 VIEW  Comparison: None  Findings: Normal heart size, mediastinal contours, and pulmonary vascularity. Emphysematous minimal bronchitic changes. Poorly defined  density at lateral right apex may represent pleural thickening or scarring but nodule is not excluded. Remaining lungs clear. No acute infiltrate, pleural effusion or pneumothorax. Bones appear demineralized.  IMPRESSION: Emphysematous and minimal bronchitic changes. No definite acute infiltrate. Question scarring at right apex though nodule is not completely excluded; if the patient has prior outside chest radiographs, these would be of benefit to establish stability of this finding. In the absence of prior studies, follow-up noncontrast CT chest would be recommended to exclude pulmonary nodule.  Original Report Authenticated By: Lollie Marrow, M.D.   US Renal  01/09/2012  *RADIOLOGY REPORT*  Clinical Data: History of acute renal failure.  RENAL/URINARY TRACT ULTRASOUND COMPLETE  Comparison:  None.  Findings:  Right Kidney:  Right renal length is 12.4 cm.  Left Kidney:  Left renal length is 11.8 cm.  Examination of each kidney shows no evidence of hydronephrosis, solid or cystic mass, calculus, parenchymal loss, or parenchymal textural abnormality.  Bladder:  No urinary bladder abnormality is evident.  IMPRESSION: No renal abnormality is demonstrated by ultrasound.  Original Report Authenticated By: Crawford Givens, M.D.     Assessment/Plan: 66 yo female with history of HTN, hypothyroidism presenting with ARF and metabolic acidosis.  1. ARF. Most likely related to severe dehydration due to viral illness and ACEi. Less likely rhabdomyolysis (elevated CK+myalgias) or ingestion. [place] rules out obstructive causes. UA showing granular casts c/w ATN/prerenal, minimal protein, large Hbg, 3-6 RBCs. Will f/u urine studies, trend CK. Continue generous IVF hydration as she has already shown some mild improvement in 5 hrs since arrival. Hold ACEi/NSAIDs indefinitely. BUN is elevated but down trending, no acute needs for HD currently.   2. Metabolic acidosis. Likely secondary to ARF as lactate and salicylates negative.  Continue bicarbonate infusion and trend daily renal function panel. Suspect this will correct slowly.  3. Hypothyroidism. Restart home dose levothyroxine. TSH elevated in acute setting likely due to illness and inability to ingest med at home.   4. N/V. Improved. Antiemetics prn.   5. HTN. Avoid ACEi, diuretics currently. May restart norvasc if SBP> 160.    Mikenna Bunkley PGY-2 01/09/2012, 10:20 AM

## 2012-01-09 NOTE — ED Notes (Signed)
Patient transported to X-ray 

## 2012-01-09 NOTE — ED Provider Notes (Signed)
Medical screening examination/treatment/procedure(s) were conducted as a shared visit with non-physician practitioner(s) and myself.  I personally evaluated the patient during the encounter  H/o ETOH abuse. Patient presents with generalized weakness. Unable to walk. Recently diagnosed with pna. No fever. RRR, no mrg. Strength 4+/5 all extremities. Neuro exam unremarkable. Heme negative per PA. New ARI and uremia noted, denies recent ethanol, last drink several days ago. Discussed admit with [facility]-- further w/u and evaluation, possible dialysis needed. ABG pending.  Forbes Cellar, MD 01/09/12 [phone]

## 2012-01-09 NOTE — Progress Notes (Signed)
CRITICAL VALUE ALERT  Critical value received:  CKMB 17.7  Date of notification:  01/09/12  Time of notification:  0612  Critical value read back:yes  Nurse who received alert:  D. Madelin Rear, RN  MD notified (1st page):  Donnamarie Poag, NP  Time of first page:  0612  MD notified (2nd page): Dr. Butler Denmark  Time of second page: 0715  Responding MD:  Dr. Butler Denmark  Time MD responded:  [phone]  No new orders received.

## 2012-01-09 NOTE — Progress Notes (Signed)
Utilization Review Completed.[name] T1/29/2013   

## 2012-01-10 LAB — RENAL FUNCTION PANEL
Albumin: 3 g/dL — ABNORMAL LOW (ref 3.5–5.2)
BUN: 106 mg/dL — ABNORMAL HIGH (ref 6–23)
CO2: 24 mEq/L (ref 19–32)
Calcium: 7.6 mg/dL — ABNORMAL LOW (ref 8.4–10.5)
Chloride: 91 mEq/L — ABNORMAL LOW (ref 96–112)
Creatinine, Ser: 16.01 mg/dL — ABNORMAL HIGH (ref 0.50–1.10)
GFR calc Af Amer: 2 mL/min — ABNORMAL LOW (ref >90)
GFR calc non Af Amer: 2 mL/min — ABNORMAL LOW (ref >90)
Glucose, Bld: 93 mg/dL (ref 70–99)
Phosphorus: 7 mg/dL — ABNORMAL HIGH (ref 2.3–4.6)
Potassium: 3.9 mEq/L (ref 3.5–5.1)
Sodium: 133 mEq/L — ABNORMAL LOW (ref 135–145)

## 2012-01-10 LAB — T4, FREE: Free T4: 0.28 ng/dL — ABNORMAL LOW (ref 0.80–1.80)

## 2012-01-10 LAB — CREATININE, URINE, RANDOM: Creatinine, Urine: 41.92 mg/dL

## 2012-01-10 LAB — MAGNESIUM: Magnesium: 1.7 mg/dL (ref 1.5–2.5)

## 2012-01-10 LAB — SODIUM, URINE, RANDOM: Sodium, Ur: 71 mEq/L

## 2012-01-10 LAB — T3: T3, Total: 29.7 ng/dl — ABNORMAL LOW (ref 80.0–204.0)

## 2012-01-10 MED ORDER — POTASSIUM CHLORIDE CRYS ER 20 MEQ PO TBCR
20.0000 meq | EXTENDED_RELEASE_TABLET | Freq: Once | ORAL | Status: AC
  Administered 2012-01-10: 20 meq via ORAL
  Filled 2012-01-10: qty 1

## 2012-01-10 NOTE — Progress Notes (Signed)
Pt Transferred to unit per MD order. Pt is alert and oriented, pleasant; oriented to room. No complaints of pain or sign of distress. Burley Saver, RN.

## 2012-01-10 NOTE — Progress Notes (Signed)
Subjective: Interval History: Hypertensive overnight to 170s/80s. UOP 1250/24 hours. Pt feels improved today. Requested [place] O2 last evening due to dry throat, no dyspnea.   Objective:  Vital signs in last 24 hours:  Temp:  [97.3 F (36.3 C)-98.5 F (36.9 C)] 97.9 F (36.6 C) (01/30 0400) Pulse Rate:  [73-94] 73  (01/30 0400) Resp:  [18] 18  (01/30 0400) BP: (146-175)/(82-87) 151/82 mmHg (01/30 0400) SpO2:  [100 %] 100 % (01/30 0400) Weight:  [165 lb 12.6 oz (75.2 kg)] 165 lb 12.6 oz (75.2 kg) (01/30 0500) Weight change: 1 lb 12.2 oz (0.8 kg)  Intake/Output: I/O last 3 completed shifts: In: 1860 [P.O.:360; I.V.:1500] Out: 1250 [Urine:1250]  Intake/Output this shift:     EXAM:  General appearance - acyanotic, in no respiratory distress and ill-appearing  Mouth - mucous membranes moist, pharynx normal without lesions  Chest -Right rales to mid lung.  Heart - normal rate, regular rhythm, normal S1, S2, no murmurs Neurological - alert, oriented, normal speech, mild hand asterixis.  Extremities - peripheral pulses normal, no pedal edema, no clubbing or cyanosis      . folic acid  1 mg Oral Daily  . levothyroxine  25 mcg Oral QAC breakfast  . LORazepam  0-4 mg Intravenous Q6H   Followed by  . LORazepam  0-4 mg Intravenous Q12H  . metoprolol tartrate  25 mg Oral BID  . mulitivitamin with minerals  1 tablet Oral Daily  . pneumococcal 23 valent vaccine  0.5 mL Intramuscular Tomorrow-1000  . thiamine  100 mg Oral Daily   Or  . thiamine  100 mg Intravenous Daily  . white petrolatum         Lab Results:  Basename 01/09/12 0510 01/09/12 0003  WBC 7.1 9.1  HGB 11.2* 12.6  HCT 30.5* 34.4*  PLT 114* 165   BMET  Basename 01/10/12 0420 01/09/12 1833 01/09/12 0510  NA 133* 130* 129*  K 3.9 4.7 5.2*  CL 91* 93* 97  CO2 24 19 13*  GLUCOSE 93 100* 91  BUN 106* 115* 122*  CREATININE 16.01* 16.78* 17.69*  CALCIUM 7.6* 7.5* 7.9*  PHOS 7.0* 7.3* 7.1*   LFT  Basename  01/10/12 0420 01/09/12 0510 01/09/12 0003  PROT -- 6.0 --  ALBUMIN 3.0* -- --  AST -- 19 --  ALT -- 11 --  ALKPHOS -- 73 --  BILITOT -- 0.2* --  BILIDIR -- -- 0.1  IBILI -- -- 0.1*   PT/INR No results found for this basename: LABPROT:2,INR:2 in the last 72 hours Hepatitis Panel No results found for this basename: HEPBSAG,HCVAB,HEPAIGM,HEPBIGM in the last 72 hours PTH: Lab Results  Component Value Date   CALCIUM 7.6* 01/10/2012   PHOS 7.0* 01/10/2012    Studies/Results: Dg Chest 2 View  01/09/2012  *RADIOLOGY REPORT*  Clinical Data: Cough, body pain, weakness, recent pneumonia  CHEST - 2 VIEW  Comparison: None  Findings: Normal heart size, mediastinal contours, and pulmonary vascularity. Emphysematous minimal bronchitic changes. Poorly defined density at lateral right apex may represent pleural thickening or scarring but nodule is not excluded. Remaining lungs clear. No acute infiltrate, pleural effusion or pneumothorax. Bones appear demineralized.  IMPRESSION: Emphysematous and minimal bronchitic changes. No definite acute infiltrate. Question scarring at right apex though nodule is not completely excluded; if the patient has prior outside chest radiographs, these would be of benefit to establish stability of this finding. In the absence of prior studies, follow-up noncontrast CT chest would be recommended to  exclude pulmonary nodule.  Original Report Authenticated By: Lollie Marrow, M.D.   US Renal  01/09/2012  *RADIOLOGY REPORT*  Clinical Data: History of acute renal failure.  RENAL/URINARY TRACT ULTRASOUND COMPLETE  Comparison:  None.  Findings:  Right Kidney:  Right renal length is 12.4 cm.  Left Kidney:  Left renal length is 11.8 cm.  Examination of each kidney shows no evidence of hydronephrosis, solid or cystic mass, calculus, parenchymal loss, or parenchymal textural abnormality.  Bladder:  No urinary bladder abnormality is evident.  IMPRESSION: No renal abnormality is demonstrated by  ultrasound.  Original Report Authenticated By: Crawford Givens, M.D.    Assessment/Plan: 66 yo female with history of HTN, hypothyroidism presenting with ARF and metabolic acidosis.   1. ARF. Most likely related to severe dehydration due to viral illness and ACEi causing ATN. UA showing granular casts c/w ATN, minimal protein, large Hbg, 3-6 RBCs. FeNa >20 c/w ATN. She has already shown some mild improvement in cr with hydration. BUN is elevated but down trending, no acute needs for HD currently. No oliguria, continue strict ins/outs. WIll observe for any physical signs of fluid overload and decrease IVF rate as needed today.   2. Metabolic acidosis. Likely secondary to ARF. Bicarb level has improved with bicarbonate infusion. WIll trend daily renal function panel.    3. Hypothyroidism. Restart home dose levothyroxine. TSH elevated in acute setting likely due to illness and inability to ingest med at home.   4. N/V. Improved. Antiemetics prn.   5. HTN. Avoid ACEi, diuretics currently. Allergy to CCBs. Started on metoprolol yesterday.     LOS: 2 Lloyd Huger, MD [facility] Resident - PGY-2 01/10/2012 7:43 AM

## 2012-01-10 NOTE — Progress Notes (Signed)
[facility]  Subjective: Endorses today previous nausea has nearly resolved and she is requesting diet more than clear liquids. No other complaints verbalized.  Objective: Vital signs in last 24 hours: Temp:  [97.4 F (36.3 C)-98.5 F (36.9 C)] 97.4 F (36.3 C) (01/30 0800) Pulse Rate:  [73-94] 77  (01/30 1036) Resp:  [18] 18  (01/30 0400) BP: (146-175)/(82-87) 156/84 mmHg (01/30 1036) SpO2:  [100 %] 100 % (01/30 0400) Weight:  [75.2 kg (165 lb 12.6 oz)] 75.2 kg (165 lb 12.6 oz) (01/30 0500) Weight change: 0.8 kg (1 lb 12.2 oz)    Intake/Output from previous day: 01/29 0701 - 01/30 0700 In: 1360 [P.O.:360; I.V.:1000] Out: 1250 [Urine:1250] Intake/Output this shift:    General appearance: alert, cooperative, appears stated age and no distress Resp: clear to auscultation bilaterally Cardio: regular rate and rhythm, S1, S2 normal, no murmur, click, rub or gallop GI: soft, non-tender; bowel sounds normal; no masses,  no organomegaly Extremities: extremities normal, atraumatic, no cyanosis or edema Neurologic: Grossly normal except for mild fasciculations of the face bilaterally. No obvious extremity tremors or asterixis.  Lab Results:  Basename 01/09/12 0510 01/09/12 0003  WBC 7.1 9.1  HGB 11.2* 12.6  HCT 30.5* 34.4*  PLT 114* 165   BMET  Basename 01/10/12 0420 01/09/12 1833  NA 133* 130*  K 3.9 4.7  CL 91* 93*  CO2 24 19  GLUCOSE 93 100*  BUN 106* 115*  CREATININE 16.01* 16.78*  CALCIUM 7.6* 7.5*   Medications:  I have reviewed the patient's current medications.  Assessment/Plan:   *Acute renal failure/ Metabolic acidosis/Hyponatremia Appreciate renal assistance. Currently has appropriate urine output and no indications for hemodialysis. Metabolic acidosis has resolved in the setting of bicarbonate infusion and the renal team has decreased the infusion rate from 125 to 75 cc per hour. Creatinine has decreased from 18 to 16. Her potassium has been  steadily drifting downward after initiation of bicarbonate. We'll go ahead and give 1 dose of 20 mEq of potassium orally today and check a renal panel in the morning.  Hypovolemia dehydration RESOLVED  now is euvolemic. See above.   HTN (hypertension) Continue metoprolol 25 mg by mouth twice a day since patient is unable to take her normal medications from home (thiazide diuretic/ACE inhibitor) because of acute renal failure.  Anemia/ B12 deficiency Hemoglobin stable. Continue vitamin supplements.  Hypothyroidism TSH markedly elevated at 68. Suspect given this high reading that this patient has not been taking her medications appropriately. Will check free T4 and T3. Also unclear if hypothyroidism is a relatively new diagnosis; note preadmission dosage very low at 25 mcg and this is normally a starting dosage especially in an older patient. May need to obtain medical records from primary care physician.  Hyperlipidemia Home statin on hold due to acute renal failure.  History of TIA (transient ischemic attack) Currently no symptoms. Was on Plavix prior to admission. Will need to hold until renal function improves.  Alcoholism Continue CIWA protocol  Disposition Transfer to renal floor    LOS: 2 days   Junious Silk, ANP pager [phone]  [facility] 8 [email or web address] Password: [email or web address]  01/10/2012, 11:34 AM  I have personally examined this patient and reviewed the entire database. I have reviewed the above note, made any necessary editorial changes, and agree with its content.  Lonia Blood, MD [facility]   I have examined the patient and reviewed the chart. I have modified the above note and agree with it.  Calvert Cantor, MD [phone]

## 2012-01-10 NOTE — Progress Notes (Signed)
I have seen and examined this patient and agree with the plan of care creatinine a little better but bicarbonate improved will slow rate of infusion. Continue to follow. Ray Glacken W 01/10/2012, 11:14 AM

## 2012-01-10 NOTE — Progress Notes (Signed)
Pt. Being transferred to 6700 via wheelchair. Pt. And son are of the new room number. Phone report called to [facility].

## 2012-01-11 ENCOUNTER — Inpatient Hospital Stay
Admission: RE | Admit: 2012-01-11 | Discharge: 2012-01-11 | Payer: Self-pay | Source: Ambulatory Visit | Attending: Family Medicine | Admitting: Family Medicine

## 2012-01-11 LAB — RENAL FUNCTION PANEL
Albumin: 2.8 g/dL — ABNORMAL LOW (ref 3.5–5.2)
BUN: 100 mg/dL — ABNORMAL HIGH (ref 6–23)
CO2: 28 mEq/L (ref 19–32)
Calcium: 6.7 mg/dL — ABNORMAL LOW (ref 8.4–10.5)
Chloride: 87 mEq/L — ABNORMAL LOW (ref 96–112)
Creatinine, Ser: 15.05 mg/dL — ABNORMAL HIGH (ref 0.50–1.10)
GFR calc Af Amer: 3 mL/min — ABNORMAL LOW (ref >90)
GFR calc non Af Amer: 2 mL/min — ABNORMAL LOW (ref >90)
Glucose, Bld: 116 mg/dL — ABNORMAL HIGH (ref 70–99)
Phosphorus: 6.5 mg/dL — ABNORMAL HIGH (ref 2.3–4.6)
Potassium: 3.6 mEq/L (ref 3.5–5.1)
Sodium: 133 mEq/L — ABNORMAL LOW (ref 135–145)

## 2012-01-11 MED ORDER — FAMOTIDINE 40 MG/5ML PO SUSR
20.0000 mg | Freq: Once | ORAL | Status: AC
  Administered 2012-01-11: 20 mg via ORAL
  Filled 2012-01-11: qty 2.5

## 2012-01-11 MED ORDER — RANITIDINE HCL 150 MG/10ML PO SYRP: 150.0000 mg | ORAL_SOLUTION | Freq: Once | ORAL | Status: DC

## 2012-01-11 MED ORDER — LEVOTHYROXINE SODIUM 50 MCG PO TABS
50.0000 ug | ORAL_TABLET | Freq: Every day | ORAL | Status: DC
  Administered 2012-01-12 – 2012-01-17 (×6): 50 ug via ORAL
  Filled 2012-01-11 (×8): qty 1

## 2012-01-11 MED ORDER — SODIUM CHLORIDE 0.9 % IV SOLN
INTRAVENOUS | Status: DC
  Administered 2012-01-11 (×2): via INTRAVENOUS
  Administered 2012-01-12: 75 mL/h via INTRAVENOUS
  Administered 2012-01-13 – 2012-01-15 (×3): via INTRAVENOUS

## 2012-01-11 NOTE — Progress Notes (Signed)
Subjective: Interval History: Hypertension improved. UOP recorded only as 375cc/24 hours, but patient endorses urinating more frequently than that. Weaned to room air. No complaints today.  Objective:  Vital signs in last 24 hours:  Temp:  [97.4 F (36.3 C)-98.5 F (36.9 C)] 98.1 F (36.7 C) (01/31 0628) Pulse Rate:  [70-77] 77  (01/31 0628) Resp:  [15-18] 16  (01/31 0628) BP: (134-160)/(64-84) 145/70 mmHg (01/31 0628) SpO2:  [96 %-99 %] 96 % (01/31 0628) Weight:  [165 lb 5.5 oz (75 kg)-167 lb 5.3 oz (75.9 kg)] 165 lb 5.5 oz (75 kg) (01/31 0628) Weight change: 1 lb 8.7 oz (0.7 kg)  Intake/Output: I/O last 3 completed shifts: In: 1240 [P.O.:240; I.V.:1000] Out: 650 [Urine:650]  Intake/Output this shift:  Intake/Output Summary (Last 24 hours) at 01/11/12 0726 Last data filed at 01/10/12 1808  Gross per 24 hour  Intake    240 ml  Output    375 ml  Net   -135 ml       EXAM:  General appearance - acyanotic, in no respiratory distress and ill-appearing  Mouth - mucous membranes moist, pharynx normal without lesions  Chest -CTAB. nonlabored resp.  Heart - normal rate, regular rhythm, normal S1, S2, no murmurs Neurological - alert, oriented, normal speech.  Extremities - peripheral pulses normal, no pedal edema, no clubbing or cyanosis      . folic acid  1 mg Oral Daily  . levothyroxine  25 mcg Oral QAC breakfast  . LORazepam  0-4 mg Intravenous Q6H   Followed by  . LORazepam  0-4 mg Intravenous Q12H  . metoprolol tartrate  25 mg Oral BID  . mulitivitamin with minerals  1 tablet Oral Daily  . pneumococcal 23 valent vaccine  0.5 mL Intramuscular Tomorrow-1000  . potassium chloride  20 mEq Oral Once  . thiamine  100 mg Oral Daily   Or  . thiamine  100 mg Intravenous Daily     Lab Results:  Basename 01/09/12 0510 01/09/12 0003  WBC 7.1 9.1  HGB 11.2* 12.6  HCT 30.5* 34.4*  PLT 114* 165   BMET  Basename 01/11/12 0640 01/10/12 0420 01/09/12 1833  NA 133* 133*  130*  K 3.6 3.9 4.7  CL 87* 91* 93*  CO2 28 24 19   GLUCOSE 116* 93 100*  BUN 100* 106* 115*  CREATININE 15.05* 16.01* 16.78*  CALCIUM 6.7* 7.6* 7.5*  PHOS 6.5* 7.0* 7.3*   LFT  Basename 01/10/12 0420 01/09/12 0510 01/09/12 0003  PROT -- 6.0 --  ALBUMIN 3.0* -- --  AST -- 19 --  ALT -- 11 --  ALKPHOS -- 73 --  BILITOT -- 0.2* --  BILIDIR -- -- 0.1  IBILI -- -- 0.1*   PT/INR No results found for this basename: LABPROT:2,INR:2 in the last 72 hours Hepatitis Panel No results found for this basename: HEPBSAG,HCVAB,HEPAIGM,HEPBIGM in the last 72 hours PTH: Lab Results  Component Value Date   CALCIUM 7.6* 01/10/2012   PHOS 7.0* 01/10/2012    Studies/Results: US Renal  01/09/2012  *RADIOLOGY REPORT*  Clinical Data: History of acute renal failure.  RENAL/URINARY TRACT ULTRASOUND COMPLETE  Comparison:  None.  Findings:  Right Kidney:  Right renal length is 12.4 cm.  Left Kidney:  Left renal length is 11.8 cm.  Examination of each kidney shows no evidence of hydronephrosis, solid or cystic mass, calculus, parenchymal loss, or parenchymal textural abnormality.  Bladder:  No urinary bladder abnormality is evident.  IMPRESSION: No renal abnormality is  demonstrated by ultrasound.  Original Report Authenticated By: Crawford Givens, M.D.    Assessment/Plan: 66 yo female with history of HTN, hypothyroidism presenting with ARF and metabolic acidosis.   1. ARF. Most likely related to severe dehydration due to viral illness and ACEi causing ATN. UA showing granular casts c/w ATN, minimal protein, large Hbg, 3-6 RBCs. FeNa >20 c/w ATN. She has shown some mild improvement in cr with hydration. If ins/outs are accurate, this would indicate oliguria but pt endorses higher output than documentation supports. No IVF charted though bicarb is ordered to be given at 75 cc/hr.   2. Metabolic acidosis. Likely secondary to ARF. Has resolved with bicarbonate infusion. Will change IVF to NS and DC bicarb today.  WIll trend daily renal function panel.    3. Hypothyroidism. Restart home dose levothyroxine. TSH elevated in acute setting likely due to illness and inability to ingest med at home. May need increased dose in long term.   4. N/V. Improved. Antiemetics prn.   5. HTN. Improved, possibly worsened by EtOH withdrawal. Avoid ACEi, diuretics currently. Allergy to CCBs. Started on metoprolol yesterday.    LOS: 3 Lloyd Huger, MD [facility] Resident - PGY-2 01/11/2012 7:23 AM

## 2012-01-11 NOTE — Progress Notes (Signed)
I have seen and examined this patient and agree with the plan of careimproving renal function slowly no indications for dialysis  [facility] W 01/11/2012, 10:14 AM

## 2012-01-11 NOTE — Progress Notes (Signed)
PCP: Monica Becton, MD, MD  Brief HPI:  66 year-old female with history of hypertension, hypothyroidism, TIA and alcoholism was brought to the ER with patient's son as patient was to be increasingly weak not able to walk even small distance. Patient has been having nausea and vomiting and unable to take in anything. Patient did not have any abdominal pain or diarrhea. Patient had cough and fever over a month ago and had gone to her PCP who prescribed her some antitussives and Zithromax. Again last week she went back to PCP because of the similar complaints and was placed on Levaquin. After taking Levaquin for 2 days patient had the intractable nausea and vomiting. Patient's son also had stomach flu. Patient denied any chest pain, shortness of breath or loss of consciousness. Patient drinks alcohol everyday but not for the 4 days prior to admission. Patient denies having take any salicylates or any other type of alcohol containing products. In the ER patient was found to be in acute renal failure with creatinine of 18 and bicarbonate of 12 with anion gap acidosis. Patient was initially admitted to SDU and then transferred to the floor.  Past medical history:  Past Medical History   Diagnosis  Date   .  Hypertension    .  Menopause    .  Hypothyroid    .  Mini stroke    .  Hyperlipidemia    .  B12 deficiency      Consultants: Nephrology (Dr. Hyman Hopes)  Procedures: None so far  Subjective: Patient feels much better. Producing urine. No nausea or vomiting. Poor appetite. Says levothyroxine dose was last changed many months ago.  Objective: Vital signs in last 24 hours: Temp:  [97.9 F (36.6 C)-98.5 F (36.9 C)] 98.1 F (36.7 C) (01/31 0628) Pulse Rate:  [70-77] 77  (01/31 0628) Resp:  [15-18] 16  (01/31 0628) BP: (134-151)/(64-73) 145/70 mmHg (01/31 0628) SpO2:  [96 %-99 %] 96 % (01/31 0628) Weight:  [75 kg (165 lb 5.5 oz)-75.9 kg (167 lb 5.3 oz)] 75 kg (165 lb 5.5 oz) (01/31  0628) Weight change: 0.7 kg (1 lb 8.7 oz) Last BM Date: 01/05/12  Intake/Output from previous day: 01/30 0701 - 01/31 0700 In: 240 [P.O.:240] Out: 375 [Urine:375] Intake/Output this shift:    General appearance: alert, cooperative, appears stated age and no distress Head: Normocephalic, without obvious abnormality, atraumatic Throat: lips, mucosa, and tongue normal; teeth and gums normal Neck: no adenopathy, no carotid bruit, no JVD, supple, symmetrical, trachea midline and thyroid not enlarged, symmetric, no tenderness/mass/nodules Back: symmetric, no curvature. ROM normal. No CVA tenderness. Resp: clear to auscultation bilaterally Cardio: regular rate and rhythm, S1, S2 normal, no murmur, click, rub or gallop GI: soft, non-tender; bowel sounds normal; no masses,  no organomegaly Extremities: Left arm is swollen since last night. Has IV on that side. Radial pulse palpable. Pulses: 2+ and symmetric Skin: Skin color, texture, turgor normal. No rashes or lesions Lymph nodes: Cervical, supraclavicular, and axillary nodes normal. Neurologic: Grossly normal  Lab Results:  Basename 01/09/12 0510 01/09/12 0003  WBC 7.1 9.1  HGB 11.2* 12.6  HCT 30.5* 34.4*  PLT 114* 165   BMET  Basename 01/11/12 0640 01/10/12 0420  NA 133* 133*  K 3.6 3.9  CL 87* 91*  CO2 28 24  GLUCOSE 116* 93  BUN 100* 106*  CREATININE 15.05* 16.01*  CALCIUM 6.7* 7.6*    Studies/Results: No results found.  Medications:  Scheduled:   . folic  acid  1 mg Oral Daily  . levothyroxine  25 mcg Oral QAC breakfast  . LORazepam  0-4 mg Intravenous Q6H   Followed by  . LORazepam  0-4 mg Intravenous Q12H  . metoprolol tartrate  25 mg Oral BID  . mulitivitamin with minerals  1 tablet Oral Daily  . potassium chloride  20 mEq Oral Once  . thiamine  100 mg Oral Daily   Or  . thiamine  100 mg Intravenous Daily    Assessment/Plan:  Principal Problem:  *Acute renal failure Active Problems:  HTN  (hypertension)  Hyperlipidemia  TIA (transient ischemic attack)  Hypovolemia dehydration  Metabolic acidosis  Hyponatremia  Anemia  Alcoholism  B12 deficiency    1. Acute renal failure/ Metabolic acidosis/Hyponatremia: Improving slowly. Appreciate renal assistance. No indications for hemodialysis. Metabolic acidosis has resolved.  2. Hypovolemia/dehydration: RESOLVED now is euvolemic. See above.   3. HTN (hypertension): Continue metoprolol. Monitor BP close;y.  4. Anemia/ B12 deficiency: Hemoglobin stable. Continue vitamin supplements.   5. Hypothyroidism: TSH markedly elevated at 68 with low FT4. Will increase dose of synthroid as she has been on current dose for past many months.  6. Hyperlipidemia: Home statin on hold due to acute renal failure.   7. History of TIA (transient ischemic attack): Currently no symptoms. Was on Plavix prior to admission. Will need to hold until renal function improves.   8. Alcoholism: Continue CIWA protocol   Involve PT and OT.  Left arm is swollen probably due to IV infiltration. RN notified.    LOS: 3 days   [facility] Pager [phone] 01/11/2012, 11:55 AM

## 2012-01-12 LAB — CBC
HCT: 27 % — ABNORMAL LOW (ref 36.0–46.0)
Hemoglobin: 9.6 g/dL — ABNORMAL LOW (ref 12.0–15.0)
MCH: 35.8 pg — ABNORMAL HIGH (ref 26.0–34.0)
MCHC: 35.6 g/dL (ref 30.0–36.0)
MCV: 100.7 fL — ABNORMAL HIGH (ref 78.0–100.0)
Platelets: 151 10*3/uL (ref 150–400)
RBC: 2.68 MIL/uL — ABNORMAL LOW (ref 3.87–5.11)
RDW: 15 % (ref 11.5–15.5)
WBC: 9.1 10*3/uL (ref 4.0–10.5)

## 2012-01-12 LAB — RENAL FUNCTION PANEL
Albumin: 2.6 g/dL — ABNORMAL LOW (ref 3.5–5.2)
BUN: 90 mg/dL — ABNORMAL HIGH (ref 6–23)
CO2: 25 mEq/L (ref 19–32)
Calcium: 6.3 mg/dL — CL (ref 8.4–10.5)
Chloride: 91 mEq/L — ABNORMAL LOW (ref 96–112)
Creatinine, Ser: 13.62 mg/dL — ABNORMAL HIGH (ref 0.50–1.10)
GFR calc Af Amer: 3 mL/min — ABNORMAL LOW (ref >90)
GFR calc non Af Amer: 2 mL/min — ABNORMAL LOW (ref >90)
Glucose, Bld: 88 mg/dL (ref 70–99)
Phosphorus: 5.9 mg/dL — ABNORMAL HIGH (ref 2.3–4.6)
Potassium: 3.3 mEq/L — ABNORMAL LOW (ref 3.5–5.1)
Sodium: 130 mEq/L — ABNORMAL LOW (ref 135–145)

## 2012-01-12 MED ORDER — CALCIUM CARBONATE ANTACID 500 MG PO CHEW
1.0000 | CHEWABLE_TABLET | Freq: Three times a day (TID) | ORAL | Status: DC
  Administered 2012-01-12 – 2012-01-17 (×15): 200 mg via ORAL
  Filled 2012-01-12 (×17): qty 1

## 2012-01-12 MED ORDER — POLYETHYLENE GLYCOL 3350 17 G PO PACK
17.0000 g | PACK | Freq: Every day | ORAL | Status: DC
  Administered 2012-01-12: 17 g via ORAL
  Filled 2012-01-12 (×3): qty 1

## 2012-01-12 MED ORDER — DOCUSATE SODIUM 100 MG PO CAPS
100.0000 mg | ORAL_CAPSULE | Freq: Two times a day (BID) | ORAL | Status: DC
  Administered 2012-01-12 – 2012-01-15 (×5): 100 mg via ORAL
  Filled 2012-01-12 (×12): qty 1

## 2012-01-12 NOTE — Progress Notes (Signed)
Subjective: Interval History: nausea Objective: Vital signs in last 24 hours:  Temp:  [97.3 F (36.3 C)-98.2 F (36.8 C)] 97.8 F (36.6 C) (02/01 0525) Pulse Rate:  [71-81] 75  (02/01 0525) Resp:  [16-18] 18  (02/01 0525) BP: (124-148)/(65-75) 124/75 mmHg (02/01 0525) SpO2:  [98 %-100 %] 100 % (02/01 0525) Weight:  [77.5 kg (170 lb 13.7 oz)] 77.5 kg (170 lb 13.7 oz) (01/31 2146)  Weight change: 1.6 kg (3 lb 8.4 oz)  Intake/Output: I/O last 3 completed shifts: In: 720 [P.O.:720] Out: 2000 [Urine:2000]   Intake/Output this shift:     CVS- RRR RS- CTA ABD- BS present soft non-distended EXT- no edema  Lab Results:  [facility] 01/12/12 0525  WBC 9.1  HGB 9.6*  HCT 27.0*  PLT 151   BMET  Basename 01/12/12 0525 01/11/12 0640 01/10/12 0420  NA 130* 133* 133*  K 3.3* 3.6 3.9  CL 91* 87* 91*  CO2 25 28 24   GLUCOSE 88 116* 93  BUN 90* 100* 106*  CREATININE 13.62* 15.05* 16.01*  CALCIUM 6.3* 6.7* 7.6*  PHOS 5.9* 6.5* 7.0*   LFT  Basename 01/12/12 0525  PROT --  ALBUMIN 2.6*  AST --  ALT --  ALKPHOS --  BILITOT --  BILIDIR --  IBILI --   PT/INR No results found for this basename: LABPROT:2,INR:2 in the last 72 hours Hepatitis Panel No results found for this basename: HEPBSAG,HCVAB,HEPAIGM,HEPBIGM in the last 72 hours  Studies/Results: No results found.  I have reviewed the patient's current medications.  Lab Results:  [facility] 01/12/12 0525  WBC 9.1  HGB 9.6*  HCT 27.0*  PLT 151   BMET  Basename 01/12/12 0525 01/11/12 0640 01/10/12 0420  NA 130* 133* 133*  K 3.3* 3.6 3.9  CL 91* 87* 91*  CO2 25 28 24   GLUCOSE 88 116* 93  BUN 90* 100* 106*  CREATININE 13.62* 15.05* 16.01*  CALCIUM 6.3* 6.7* 7.6*  PHOS 5.9* 6.5* 7.0*   LFT  Basename 01/12/12 0525  PROT --  ALBUMIN 2.6*  AST --  ALT --  ALKPHOS --  BILITOT --  BILIDIR --  IBILI --   PT/INR No results found for this basename: LABPROT:2,INR:2 in the last 72 hours Hepatitis  Panel No results found for this basename: HEPBSAG,HCVAB,HEPAIGM,HEPBIGM in the last 72 hours  Studies/Results: No results found.  I have reviewed the patient's current medications.  Assessment/Plan: 1. ARF. Most likely related to severe dehydration due to viral illness and ACEi causing ATN. UA showing granular casts c/Gross ATN, minimal protein, large Hbg, 3-6 RBCs. FeNa >20 c/Gross ATN. She has shown some mild improvement in cr with hydration. If ins/outs are accurate, this would indicate oliguria but pt endorses higher output than documentation supports. No IVF charted though bicarb is ordered to be given at 75 cc/hr.  2. Metabolic acidosis. Likely secondary to ARF. Has resolved with bicarbonate infusion. Will change IVF to NS and DC bicarb today. WIll trend daily renal function panel.  3. Hypothyroidism. Restart home dose levothyroxine. TSH elevated in acute setting likely due to illness and inability to ingest med at home. May need increased dose in long term.  4. N/V. Improved. Antiemetics prn.  5. HTN. Improved, possibly worsened by EtOH withdrawal. Avoid ACEi, diuretics currently. Allergy to CCBs. Started on metoprolol yesterday.   Will sign off over weekend anticipate a slow return to baseline Cr    LOS: 4 [name] @TODAY @8 :43 AM  LOS: 4 [name] @TODAY @8 :43 AM

## 2012-01-12 NOTE — Significant Event (Signed)
CRITICAL VALUE ALERT  Critical value received:  CA-6.3  Date of notification:  01/12/12  Time of notification:  0720  Critical value read back:yes  Nurse who received alert:  Laverda Sorenson  MD notified (1st page):  DR. Rito Ehrlich  Time of first page:    MD notified (2nd page):  Time of second page:  Responding MD:  NO RESPONSE  Time MD responded:    Laverda Sorenson, RN

## 2012-01-12 NOTE — Evaluation (Signed)
Occupational Therapy Evaluation Patient Details Name: [name] MRN: [id number] DOB: 1946-06-02 Today's Date: 01/12/2012 10:16-10:34  Problem List:  Patient Active Problem List  Diagnoses  . Acute renal failure  . HTN (hypertension)  . Hyperlipidemia  . TIA (transient ischemic attack)  . Hypovolemia dehydration  . Metabolic acidosis  . Hyponatremia  . Anemia  . Alcoholism  . B12 deficiency    Past Medical History:  Past Medical History  Diagnosis Date  . Hypertension   . Menopause   . Hypothyroid   . Mini stroke   . Hyperlipidemia   . B12 deficiency   . Shortness of breath   . Stroke   . Headache   . Anxiety   . Pneumonia   . Anemia   . Depression    Past Surgical History:  Past Surgical History  Procedure Date  . Appendectomy   . Tonsillectomy     OT Assessment/Plan/Recommendation OT Assessment Clinical Impression Statement: This 66 yo female admitted generalized weakness and found to have acute renal failure presents to acute OT at an I-S level and she will have S by son at home. No futher OT needs identified, acute OT will sign off. OT Recommendation/Assessment: Patient does not need any further OT services OT Recommendation Follow Up Recommendations: No OT follow up Equipment Recommended: None recommended by OT OT Goals    OT Evaluation Precautions/Restrictions  Precautions Precautions:  (None) Required Braces or Orthoses: No Restrictions Weight Bearing Restrictions: No Prior Functioning Home Living Lives With: Sheran Spine Help From: Family;Other (Comment) (son can be there as much as needed) Type of Home: Apartment Home Layout: One level Home Access: Stairs to enter Entrance Stairs-Rails: Right;Left Entrance Stairs-Number of Steps: 2 Bathroom Shower/Tub: Forensic scientist: Handicapped height Bathroom Accessibility: Yes How Accessible: Accessible via walker Home Adaptive Equipment: Shower chair with back Prior  Function Level of Independence: Independent with basic ADLs;Independent with homemaking with ambulation;Independent with gait;Independent with transfers Driving: Yes 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) ADL ADL Eating/Feeding: Simulated;Independent Where Assessed - Eating/Feeding: Edge of bed;Chair Grooming: Performed;Brushing hair;Set up Where Assessed - Grooming: Sitting, bed Upper Body Bathing: Simulated;Set up Where Assessed - Upper Body Bathing: Unsupported;Sitting, bed Lower Body Bathing: Simulated;Set up Where Assessed - Lower Body Bathing: Sit to stand from bed;Unsupported Upper Body Dressing: Simulated;Set up Where Assessed - Upper Body Dressing: Unsupported;Sitting, chair Lower Body Dressing: Performed;Set up Where Assessed - Lower Body Dressing: Unsupported;Sit to stand from chair Toilet Transfer: Performed;Supervision/safety Toilet Transfer Method: Proofreader: Comfort height toilet;Grab bars Toileting - Clothing Manipulation: Performed;Independent;Other (comment) (2 gowns) Where Assessed - Toileting Clothing Manipulation: Standing Toileting - Hygiene: Simulated;Independent Where Assessed - Toileting Hygiene: Sit on 3-in-1 or toilet Tub/Shower Transfer: Not assessed Tub/Shower Transfer Method: Not assessed Equipment Used:  (pushed IV pole) Ambulation Related to ADLs: S Vision/Perception  Vision - History Baseline Vision: No visual deficits Cognition Cognition Arousal/Alertness: Awake/alert Overall Cognitive Status: Appears within functional limits for tasks assessed Orientation Level: Oriented X4 Sensation/Coordination   Extremity Assessment RUE Assessment RUE Assessment: Within Functional Limits LUE Assessment LUE Assessment: Exceptions to [facility] LUE AROM (degrees) Left Shoulder Flexion  0-170: 140 Degrees (IV infiltrated causing AROM at shoulder) Mobility  Bed Mobility Bed Mobility: Yes Supine to Sit:  7: Independent;HOB elevated (Comment degrees) (20 degrees, she sat straight up) Transfers Transfers: Yes Sit to Stand: 6: Modified independent (Device/Increase time);With upper extremity assist;From bed Stand to Sit: 6: Modified independent (Device/Increase time);With upper extremity assist;With armrests;To  chair/3-in-1 Exercises General Exercises - Upper Extremity Shoulder Flexion: AROM;Left;Supine (educated pt to work on AROM shoulder flex supine) End of Session OT - End of Session Equipment Utilized During Treatment: Other (comment) (pushing IV pole) Activity Tolerance: Patient tolerated treatment well Patient left: in chair;Other (comment) (with PT entering room to work with her) General Behavior During Session: [facility] for tasks performed Cognition: [facility] for tasks performed   Evette Georges [phone] 01/12/2012, 10:49 AM

## 2012-01-12 NOTE — Evaluation (Addendum)
Physical Therapy Evaluation Patient Details Name: [name] MRN: [id number] DOB: [birth year] Today's Date: 01/12/2012  Problem List:  Patient Active Problem List  Diagnoses  . Acute renal failure  . HTN (hypertension)  . Hyperlipidemia  . TIA (transient ischemic attack)  . Hypovolemia dehydration  . Metabolic acidosis  . Hyponatremia  . Anemia  . Alcoholism  . B12 deficiency    Past Medical History:  Past Medical History  Diagnosis Date  . Hypertension   . Menopause   . Hypothyroid   . Mini stroke   . Hyperlipidemia   . B12 deficiency   . Shortness of breath   . Stroke   . Headache   . Anxiety   . Pneumonia   . Anemia   . Depression    Past Surgical History:  Past Surgical History  Procedure Date  . Appendectomy   . Tonsillectomy     PT Assessment/Plan/Recommendation PT Assessment Clinical Impression Statement: Pt is a 66 y/o female who was admitted for nauea and vomitting. Demonstrates mild- to moderate balance defictis. On more than one occasion pt could recover her balance with the assist of he therapist.    PT Recommendation/Assessment: Patient will need skilled PT in the acute care venue PT Problem List: Decreased balance;Decreased mobility;Decreased knowledge of use of DME Barriers to Discharge: None PT Therapy Diagnosis : Difficulty walking PT Plan PT Frequency: Min 2X/week PT Treatment/Interventions: Gait training;Functional mobility training;Balance training;Therapeutic activities;Neuromuscular re-education;Patient/family education PT Recommendation Follow Up Recommendations: Home health PT Equipment Recommended: Rolling walker with 5" wheels;3 in 1 bedside comode PT Goals  Acute Rehab PT Goals PT Goal Formulation: With patient/family Time For Goal Achievement: 7 days Pt will go Supine/Side to Sit: Independently;with HOB 0 degrees PT Goal: Supine/Side to Sit - Progress: Goal set today Pt will go Sit to Supine/Side: Independently;with HOB 0  degrees PT Goal: Sit to Supine/Side - Progress: Goal set today Pt will Transfer Bed to Chair/Chair to Bed: Independently PT Transfer Goal: Bed to Chair/Chair to Bed - Progress: Goal set today Pt will Ambulate: >150 feet;with modified independence;with least restrictive assistive device PT Goal: Ambulate - Progress: Goal set today Additional Goals Additional Goal #1: Pt will perform DGI within safe limits.   PT Goal: Additional Goal #1 - Progress: Goal set today  PT Evaluation Precautions/Restrictions  Precautions Precautions:  (None) Required Braces or Orthoses: No Restrictions Weight Bearing Restrictions: No Prior Functioning  Home Living Lives With: Sheran Spine Help From: Family Type of Home: Apartment Home Layout: One level Home Access: Stairs to enter Entrance Stairs-Rails: Doctor, general practice of Steps: 2 Bathroom Shower/Tub: Forensic scientist: Handicapped height Bathroom Accessibility: Yes How Accessible: Accessible via walker Home Adaptive Equipment: Shower chair with back Prior Function Level of Independence: Independent with basic ADLs;Independent with homemaking with ambulation;Independent with gait;Independent with transfers Able to Take Stairs?: Yes Driving: Yes 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) Cognition Cognition Arousal/Alertness: Awake/alert Overall Cognitive Status: Appears within functional limits for tasks assessed Orientation Level: Oriented X4 Sensation/Coordination   Extremity Assessment RUE Assessment RUE Assessment: Within Functional Limits LUE Assessment LUE Assessment: Not tested LUE AROM (degrees) Left Shoulder Flexion  0-170: 140 Degrees (IV infiltrated causing AROM at shoulder) RLE Assessment RLE Assessment: Within Functional Limits LLE Assessment LLE Assessment: Within Functional Limits Mobility (including Balance) Bed Mobility Bed Mobility: No Supine to Sit: 7:  Independent;HOB elevated (Comment degrees) (20 degrees, she sat straight up) Transfers Transfers: Yes Sit to Stand: 7: Independent;With armrests;From chair/3-in-1  Stand to Sit: 7: Independent Ambulation/Gait Ambulation/Gait: Yes Ambulation/Gait Assistance: 5: Supervision;4: Min assist Ambulation/Gait Assistance Details (indicate cue type and reason): Pt demonstrate mild to moderate instability with gait.  Pt lost her balance multiple times while gait training.  Ambulation Distance (Feet): 150 Feet Assistive device: None Gait Pattern: Scissoring;Lateral trunk lean to right Stairs: Yes Stairs Assistance: 5: Supervision Stair Management Technique: One rail Right Number of Stairs: 2  Height of Stairs: 6  Wheelchair Mobility Wheelchair Mobility: No  Posture/Postural Control Posture/Postural Control: No significant limitations Balance Balance Assessed: Yes Static Sitting Balance Static Sitting - Balance Support: Feet supported;No upper extremity supported Static Sitting - Level of Assistance: 7: Independent Static Sitting - Comment/# of Minutes: several minutes with no LOB Dynamic Gait Index Level Surface: Normal Change in Gait Speed: Mild Impairment Gait with Horizontal Head Turns: Mild Impairment Gait with Vertical Head Turns: Mild Impairment Gait and Pivot Turn: Mild Impairment Step Over Obstacle: Severe Impairment Step Around Obstacles: Moderate Impairment Steps: Mild Impairment Total Score: 14  Exercise  General Exercises - Upper Extremity Shoulder Flexion: AROM;Left;Supine (educated pt to work on AROM shoulder flex supine) End of Session PT - End of Session Equipment Utilized During Treatment: Gait belt Activity Tolerance: Patient tolerated treatment well Patient left: in chair;with call bell in reach;with family/visitor present (son present) Nurse Communication: Mobility status for transfers;Mobility status for ambulation General Behavior During Session: [facility] for tasks  performed Cognition: [facility] for tasks performed  Laketra Bowdish 01/12/2012, 2:13 PM [place] Carol L. Denesha Brouse DPT [phone]

## 2012-01-12 NOTE — Progress Notes (Signed)
PCP: Rudi Heap, MD, MD  Brief HPI:  66 year-old female with history of hypertension, hypothyroidism, TIA and alcoholism was brought to the ER with patient's son as patient was to be increasingly weak not able to walk even small distance. Patient has been having nausea and vomiting and unable to take in anything. Patient did not have any abdominal pain or diarrhea. Patient had cough and fever over a month ago and had gone to her PCP who prescribed her some antitussives and Zithromax. Again last week she went back to PCP because of the similar complaints and was placed on Levaquin. After taking Levaquin for 2 days patient had the intractable nausea and vomiting. Patient's son also had stomach flu. Patient denied any chest pain, shortness of breath or loss of consciousness. Patient drinks alcohol everyday but not for the 4 days prior to admission. Patient denies having take any salicylates or any other type of alcohol containing products. In the ER patient was found to be in acute renal failure with creatinine of 18 and bicarbonate of 12 with anion gap acidosis. Patient was initially admitted to SDU and then transferred to the floor.  Past medical history:  Past Medical History   Diagnosis  Date   .  Hypertension    .  Menopause    .  Hypothyroid    .  Mini stroke    .  Hyperlipidemia    .  B12 deficiency      Consultants: Nephrology (Dr. Hyman Hopes)  Procedures: None so far  Subjective: Patient feels much better. Producing urine. Some nausea this morning but now better. Ambulated without difficulty. Still constipated.  Objective: Vital signs in last 24 hours: Temp:  [97.3 F (36.3 C)-98.3 F (36.8 C)] 98.3 F (36.8 C) (02/01 0900) Pulse Rate:  [70-81] 70  (02/01 0900) Resp:  [16-20] 20  (02/01 0900) BP: (124-152)/(65-77) 152/77 mmHg (02/01 0900) SpO2:  [98 %-100 %] 100 % (02/01 0900) Weight:  [77.5 kg (170 lb 13.7 oz)] 77.5 kg (170 lb 13.7 oz) (01/31 2146) Weight change: 1.6 kg (3 lb  8.4 oz) Last BM Date: 01/05/12  Intake/Output from previous day: 01/31 0701 - 02/01 0700 In: 720 [P.O.:720] Out: 2000 [Urine:2000] Intake/Output this shift: Total I/O In: -  Out: 650 [Urine:650]  General appearance: alert, cooperative, appears stated age and no distress Head: Normocephalic, without obvious abnormality, atraumatic Throat: lips, mucosa, and tongue normal; teeth and gums normal Resp: clear to auscultation bilaterally Cardio: regular rate and rhythm, S1, S2 normal, no murmur, click, rub or gallop GI: soft, non-tender; bowel sounds normal; no masses,  no organomegaly Extremities: Left arm swelling is improved. Pulses: 2+ and symmetric Neurologic: Grossly normal  Lab Results:  Basename 01/12/12 0525  WBC 9.1  HGB 9.6*  HCT 27.0*  PLT 151   BMET  Basename 01/12/12 0525 01/11/12 0640  NA 130* 133*  K 3.3* 3.6  CL 91* 87*  CO2 25 28  GLUCOSE 88 116*  BUN 90* 100*  CREATININE 13.62* 15.05*  CALCIUM 6.3* 6.7*    Studies/Results: No results found.  Medications:  Scheduled:    . famotidine  20 mg Oral Once  . folic acid  1 mg Oral Daily  . levothyroxine  50 mcg Oral QAC breakfast  . LORazepam  0-4 mg Intravenous Q12H  . metoprolol tartrate  25 mg Oral BID  . mulitivitamin with minerals  1 tablet Oral Daily  . thiamine  100 mg Oral Daily  . DISCONTD: levothyroxine  25 mcg Oral QAC breakfast  . DISCONTD: ranitidine  150 mg Oral Once  . DISCONTD: thiamine  100 mg Intravenous Daily    Assessment/Plan:  Principal Problem:  *Acute renal failure Active Problems:  HTN (hypertension)  Hyperlipidemia  TIA (transient ischemic attack)  Hypovolemia dehydration  Metabolic acidosis  Hyponatremia  Anemia  Alcoholism  B12 deficiency    1. Acute renal failure: Improving slowly. Making urine. Appreciate renal assistance. No indications for hemodialysis. Metabolic acidosis has resolved.  2. Hypovolemia/dehydration: Resolved. See above.   3. HTN  (hypertension): Continue metoprolol. Monitor BP close;y.  4. Anemia/ B12 deficiency: Hemoglobin has dropped probably due to dilution. No overt bleeding. Check anemia panel.  5. Hypothyroidism: Dose of synthroid increased. TSH markedly elevated at 68 with low FT4.   6. Hyperlipidemia: Statin on hold due to acute renal failure.   7. History of TIA (transient ischemic attack): Currently no symptoms. Was on Plavix prior to admission. Will need to hold until renal function improves.   8. Alcoholism: Continue CIWA protocol. Counseled to cut down on alcohol use.  PT and OT. Left arm swelling is better. New IV started. Laxatives for constipation. SCD's for DVT prophylaxis. Patient is full code.   LOS: 4 days   [facility] Pager [phone] 01/12/2012, 11:41 AM

## 2012-01-12 NOTE — Plan of Care (Signed)
Problem: Phase II Progression Outcomes Goal: Progress activity as tolerated unless otherwise ordered Outcome: Completed/Met Date Met:  01/12/12 Pt participated in OT/PT evals.  [name],  [phone] 01/12/2012

## 2012-01-13 LAB — CBC
HCT: 26.2 % — ABNORMAL LOW (ref 36.0–46.0)
Hemoglobin: 9.4 g/dL — ABNORMAL LOW (ref 12.0–15.0)
MCH: 35.7 pg — ABNORMAL HIGH (ref 26.0–34.0)
MCHC: 35.9 g/dL (ref 30.0–36.0)
MCV: 99.6 fL (ref 78.0–100.0)
Platelets: 137 10*3/uL — ABNORMAL LOW (ref 150–400)
RBC: 2.63 MIL/uL — ABNORMAL LOW (ref 3.87–5.11)
RDW: 14.8 % (ref 11.5–15.5)
WBC: 9.2 10*3/uL (ref 4.0–10.5)

## 2012-01-13 LAB — IRON AND TIBC
Iron: 45 ug/dL (ref 42–135)
Saturation Ratios: 25 % (ref 20–55)
TIBC: 178 ug/dL — ABNORMAL LOW (ref 250–470)
UIBC: 133 ug/dL (ref 125–400)

## 2012-01-13 LAB — RENAL FUNCTION PANEL
Albumin: 2.6 g/dL — ABNORMAL LOW (ref 3.5–5.2)
BUN: 80 mg/dL — ABNORMAL HIGH (ref 6–23)
CO2: 24 mEq/L (ref 19–32)
Calcium: 6.3 mg/dL — CL (ref 8.4–10.5)
Chloride: 95 mEq/L — ABNORMAL LOW (ref 96–112)
Creatinine, Ser: 12.34 mg/dL — ABNORMAL HIGH (ref 0.50–1.10)
GFR calc Af Amer: 3 mL/min — ABNORMAL LOW (ref >90)
GFR calc non Af Amer: 3 mL/min — ABNORMAL LOW (ref >90)
Glucose, Bld: 89 mg/dL (ref 70–99)
Phosphorus: 5.9 mg/dL — ABNORMAL HIGH (ref 2.3–4.6)
Potassium: 3 mEq/L — ABNORMAL LOW (ref 3.5–5.1)
Sodium: 131 mEq/L — ABNORMAL LOW (ref 135–145)

## 2012-01-13 LAB — RETICULOCYTES
RBC.: 2.63 MIL/uL — ABNORMAL LOW (ref 3.87–5.11)
Retic Count, Absolute: 15.8 10*3/uL — ABNORMAL LOW (ref 19.0–186.0)
Retic Ct Pct: 0.6 % (ref 0.4–3.1)

## 2012-01-13 LAB — FOLATE: Folate: 14.8 ng/mL

## 2012-01-13 LAB — VITAMIN B12: Vitamin B-12: 371 pg/mL (ref 211–911)

## 2012-01-13 LAB — FERRITIN: Ferritin: 308 ng/mL — ABNORMAL HIGH (ref 10–291)

## 2012-01-13 MED ORDER — POTASSIUM CHLORIDE CRYS ER 20 MEQ PO TBCR
40.0000 meq | EXTENDED_RELEASE_TABLET | Freq: Once | ORAL | Status: AC
  Administered 2012-01-13: 40 meq via ORAL
  Filled 2012-01-13: qty 2

## 2012-01-13 MED ORDER — LORAZEPAM 0.5 MG PO TABS
0.5000 mg | ORAL_TABLET | Freq: Four times a day (QID) | ORAL | Status: DC | PRN
  Administered 2012-01-13 – 2012-01-16 (×4): 0.5 mg via ORAL
  Filled 2012-01-13 (×5): qty 1

## 2012-01-13 NOTE — Progress Notes (Signed)
PCP: Rudi Heap, MD, MD  Brief HPI:  66 year-old female with history of hypertension, hypothyroidism, TIA and alcoholism was brought to the ER with patient's son as patient was to be increasingly weak not able to walk even small distance. Patient has been having nausea and vomiting and unable to take in anything. Patient did not have any abdominal pain or diarrhea. Patient had cough and fever over a month ago and had gone to her PCP who prescribed her some antitussives and Zithromax. Again last week she went back to PCP because of the similar complaints and was placed on Levaquin. After taking Levaquin for 2 days patient had the intractable nausea and vomiting. Patient's son also had stomach flu. Patient denied any chest pain, shortness of breath or loss of consciousness. Patient drinks alcohol everyday but not for the 4 days prior to admission. Patient denies having take any salicylates or any other type of alcohol containing products. In the ER patient was found to be in acute renal failure with creatinine of 18 and bicarbonate of 12 with anion gap acidosis. Patient was initially admitted to SDU and then transferred to the floor.  Past medical history:  Past Medical History   Diagnosis  Date   .  Hypertension    .  Menopause    .  Hypothyroid    .  Mini stroke    .  Hyperlipidemia    .  B12 deficiency      Consultants: Nephrology (Dr. Hyman Hopes)  Procedures: None so far  Subjective: Patient continues to feel fine. Had a small BM. Occ nausea but able to tolerate diet.  Objective: Vital signs in last 24 hours: Temp:  [98 F (36.7 C)-99 F (37.2 C)] 98.4 F (36.9 C) (02/02 0506) Pulse Rate:  [65-73] 65  (02/02 0506) Resp:  [18-20] 18  (02/02 0506) BP: (122-155)/(61-85) 122/61 mmHg (02/02 0506) SpO2:  [98 %-100 %] 98 % (02/02 0506) Weight:  [74.3 kg (163 lb 12.8 oz)] 74.3 kg (163 lb 12.8 oz) (02/02 0506) Weight change: -3.2 kg (-7 lb 0.9 oz) Last BM Date: 01/05/12  Intake/Output  from previous day: 02/01 0701 - 02/02 0700 In: 1380 [P.O.:480; I.V.:900] Out: 1250 [Urine:1250] Intake/Output this shift:    General appearance: alert, cooperative, appears stated age and no distress Head: Normocephalic, without obvious abnormality, atraumatic Throat: lips, mucosa, and tongue normal; teeth and gums normal Resp: clear to auscultation bilaterally Cardio: regular rate and rhythm, S1, S2 normal, no murmur, click, rub or gallop GI: soft, non-tender; bowel sounds normal; no masses,  no organomegaly Extremities: Left arm swelling is improved. Pulses: 2+ and symmetric Neurologic: Grossly normal  Lab Results:  Basename 01/13/12 0630 01/12/12 0525  WBC 9.2 9.1  HGB 9.4* 9.6*  HCT 26.2* 27.0*  PLT 137* 151   BMET  Basename 01/13/12 0630 01/12/12 0525  NA 131* 130*  K 3.0* 3.3*  CL 95* 91*  CO2 24 25  GLUCOSE 89 88  BUN 80* 90*  CREATININE 12.34* 13.62*  CALCIUM 6.3* 6.3*    Studies/Results: No results found.  Medications:  Scheduled:    . calcium carbonate  1 tablet Oral TID WC  . docusate sodium  100 mg Oral BID  . folic acid  1 mg Oral Daily  . levothyroxine  50 mcg Oral QAC breakfast  . LORazepam  0-4 mg Intravenous Q12H  . metoprolol tartrate  25 mg Oral BID  . mulitivitamin with minerals  1 tablet Oral Daily  . polyethylene  glycol  17 g Oral Daily  . thiamine  100 mg Oral Daily    Assessment/Plan:  Principal Problem:  *Acute renal failure Active Problems:  HTN (hypertension)  Hyperlipidemia  TIA (transient ischemic attack)  Hypovolemia dehydration  Metabolic acidosis  Hyponatremia  Anemia  Alcoholism  B12 deficiency    1. Acute renal failure: Improving slowly. Making urine. Appreciate renal assistance. No indications for hemodialysis. Metabolic acidosis has resolved. Replete Potassium.  2. Nausea: Probably related to ARF. Monitor   3. HTN (hypertension): Continue metoprolol. Monitor BP close;y.  4. Anemia/ B12 deficiency:  Hemoglobin has dropped probably due to dilution. No overt bleeding. Check anemia panel.  5. Hypothyroidism: Dose of synthroid increased. TSH markedly elevated at 68 with low FT4.   6. Hyperlipidemia: Statin on hold due to acute renal failure.   7. History of TIA (transient ischemic attack): Currently no symptoms. Was on Plavix prior to admission. Will need to hold until renal function improves.   8. Alcoholism: Continue CIWA protocol. Counseled to cut down on alcohol use.  PT and OT. Laxatives for constipation. SCD's for DVT prophylaxis. Patient is full code.  Disposition: Home when stable. Anticipate discharge early next week.   LOS: 5 days   [facility] Pager [phone] 01/13/2012, 8:40 AM

## 2012-01-14 LAB — RENAL FUNCTION PANEL
Albumin: 2.5 g/dL — ABNORMAL LOW (ref 3.5–5.2)
BUN: 72 mg/dL — ABNORMAL HIGH (ref 6–23)
CO2: 21 mEq/L (ref 19–32)
Calcium: 6.3 mg/dL — CL (ref 8.4–10.5)
Chloride: 100 mEq/L (ref 96–112)
Creatinine, Ser: 11.23 mg/dL — ABNORMAL HIGH (ref 0.50–1.10)
GFR calc Af Amer: 4 mL/min — ABNORMAL LOW (ref >90)
GFR calc non Af Amer: 3 mL/min — ABNORMAL LOW (ref >90)
Glucose, Bld: 75 mg/dL (ref 70–99)
Phosphorus: 5.8 mg/dL — ABNORMAL HIGH (ref 2.3–4.6)
Potassium: 3.2 mEq/L — ABNORMAL LOW (ref 3.5–5.1)
Sodium: 136 mEq/L (ref 135–145)

## 2012-01-14 MED ORDER — POTASSIUM CHLORIDE CRYS ER 20 MEQ PO TBCR
40.0000 meq | EXTENDED_RELEASE_TABLET | Freq: Once | ORAL | Status: AC
  Administered 2012-01-14: 40 meq via ORAL
  Filled 2012-01-14: qty 1
  Filled 2012-01-14: qty 2

## 2012-01-14 MED ORDER — POLYETHYLENE GLYCOL 3350 17 G PO PACK
17.0000 g | PACK | Freq: Two times a day (BID) | ORAL | Status: DC
  Administered 2012-01-14 – 2012-01-15 (×2): 17 g via ORAL
  Filled 2012-01-14 (×7): qty 1

## 2012-01-14 NOTE — Progress Notes (Signed)
PCP: Rudi Heap, MD, MD  Brief HPI:  66 year-old female with history of hypertension, hypothyroidism, TIA and alcoholism was brought to the ER with patient's son as patient was to be increasingly weak not able to walk even small distance. Patient has been having nausea and vomiting and unable to take in anything. Patient did not have any abdominal pain or diarrhea. Patient had cough and fever over a month ago and had gone to her PCP who prescribed her some antitussives and Zithromax. Again last week she went back to PCP because of the similar complaints and was placed on Levaquin. After taking Levaquin for 2 days patient had the intractable nausea and vomiting. Patient's son also had stomach flu. Patient denied any chest pain, shortness of breath or loss of consciousness. Patient drinks alcohol everyday but not for the 4 days prior to admission. Patient denies having take any salicylates or any other type of alcohol containing products. In the ER patient was found to be in acute renal failure with creatinine of 18 and bicarbonate of 12 with anion gap acidosis. Patient was initially admitted to SDU and then transferred to the floor.  Past medical history:  Past Medical History   Diagnosis  Date   .  Hypertension    .  Menopause    .  Hypothyroid    .  Mini stroke    .  Hyperlipidemia    .  B12 deficiency      Consultants: Nephrology (Dr. Hyman Hopes)  Procedures: None so far  Subjective: Patient continues to feel fine. No complaints offered.  Objective: Vital signs in last 24 hours: Temp:  [97.8 F (36.6 C)-98.3 F (36.8 C)] 97.8 F (36.6 C) (02/03 0609) Pulse Rate:  [61-67] 61  (02/03 0609) Resp:  [18] 18  (02/03 0609) BP: (119-135)/(66-76) 119/66 mmHg (02/03 0609) SpO2:  [99 %-100 %] 99 % (02/03 0609) Weight:  [74.6 kg (164 lb 7.4 oz)] 74.6 kg (164 lb 7.4 oz) (02/02 2149) Weight change: 0.3 kg (10.6 oz) Last BM Date: 01/13/12  Intake/Output from previous day: 02/02 0701 - 02/03  0700 In: 2565 [P.O.:840; I.V.:1725] Out: 350 [Urine:350] Intake/Output this shift:    General appearance: alert, cooperative, appears stated age and no distress Resp: clear to auscultation bilaterally Cardio: regular rate and rhythm, S1, S2 normal, no murmur, click, rub or gallop GI: soft, non-tender; bowel sounds normal; no masses,  no organomegaly Neurologic: Grossly normal  Lab Results:  Basename 01/13/12 0630 01/12/12 0525  WBC 9.2 9.1  HGB 9.4* 9.6*  HCT 26.2* 27.0*  PLT 137* 151   BMET  Basename 01/14/12 0600 01/13/12 0630  NA 136 131*  K 3.2* 3.0*  CL 100 95*  CO2 21 24  GLUCOSE 75 89  BUN 72* 80*  CREATININE 11.23* 12.34*  CALCIUM 6.3* 6.3*    Studies/Results: No results found.  Medications:  Scheduled:    . calcium carbonate  1 tablet Oral TID WC  . docusate sodium  100 mg Oral BID  . folic acid  1 mg Oral Daily  . levothyroxine  50 mcg Oral QAC breakfast  . metoprolol tartrate  25 mg Oral BID  . mulitivitamin with minerals  1 tablet Oral Daily  . polyethylene glycol  17 g Oral Daily  . potassium chloride  40 mEq Oral Once  . thiamine  100 mg Oral Daily    Assessment/Plan:  Principal Problem:  *Acute renal failure Active Problems:  HTN (hypertension)  Hyperlipidemia  TIA (transient  ischemic attack)  Hypovolemia dehydration  Metabolic acidosis  Hyponatremia  Anemia  Alcoholism  B12 deficiency    1. Acute renal failure: Improving slowly. Making urine (?not charted accurately yesterday). No indications for hemodialysis. Nephrology has signed off. Metabolic acidosis has resolved. Replete Potassium.  2. Nausea: Probably related to ARF. Monitor   3. HTN (hypertension): Continue metoprolol. Monitor BP closely.  4. Anemia/ B12 deficiency: Hemoglobin dropped probably due to dilution. No overt bleeding. Anemia panel reviewed.  5. Hypothyroidism: Dose of synthroid increased. TSH markedly elevated at 68 with low FT4.   6. Hyperlipidemia:  Statin on hold due to acute renal failure.   7. History of TIA (transient ischemic attack): Currently no symptoms. Was on Plavix prior to admission. Will need to hold until renal function improves.   8. Alcoholism: Continue CIWA protocol. Counseled to cut down on alcohol use.  PT and OT. Laxatives for constipation. SCD's for DVT prophylaxis. Patient is full code.  Disposition: Home when creatinine is improved to 4 or less. Anticipate discharge early next week.   LOS: 6 days   [facility] Pager [phone] 01/14/2012, 8:33 AM

## 2012-01-15 LAB — RENAL FUNCTION PANEL
Albumin: 2.5 g/dL — ABNORMAL LOW (ref 3.5–5.2)
BUN: 61 mg/dL — ABNORMAL HIGH (ref 6–23)
CO2: 22 mEq/L (ref 19–32)
Calcium: 6.7 mg/dL — ABNORMAL LOW (ref 8.4–10.5)
Chloride: 103 mEq/L (ref 96–112)
Creatinine, Ser: 9.38 mg/dL — ABNORMAL HIGH (ref 0.50–1.10)
GFR calc Af Amer: 5 mL/min — ABNORMAL LOW (ref >90)
GFR calc non Af Amer: 4 mL/min — ABNORMAL LOW (ref >90)
Glucose, Bld: 79 mg/dL (ref 70–99)
Phosphorus: 5 mg/dL — ABNORMAL HIGH (ref 2.3–4.6)
Potassium: 3.5 mEq/L (ref 3.5–5.1)
Sodium: 135 mEq/L (ref 135–145)

## 2012-01-15 MED ORDER — POTASSIUM CHLORIDE CRYS ER 20 MEQ PO TBCR
40.0000 meq | EXTENDED_RELEASE_TABLET | Freq: Two times a day (BID) | ORAL | Status: AC
  Administered 2012-01-15 (×2): 40 meq via ORAL
  Filled 2012-01-15: qty 2

## 2012-01-15 MED ORDER — POTASSIUM CHLORIDE CRYS ER 20 MEQ PO TBCR
EXTENDED_RELEASE_TABLET | ORAL | Status: AC
  Filled 2012-01-15: qty 2

## 2012-01-15 MED ORDER — FERUMOXYTOL INJECTION 510 MG/17 ML
510.0000 mg | Freq: Once | INTRAVENOUS | Status: DC
  Filled 2012-01-15: qty 17

## 2012-01-15 NOTE — Progress Notes (Signed)
   CARE MANAGEMENT NOTE 01/15/2012  Patient:  [name]   Account Number:  [id number]  Date Initiated:  01/11/2012  Documentation initiated by:  Junius Creamer  Subjective/Objective Assessment:   adm w acute renal failure     Action/Plan:   lives w fam, was picked up today 1/31 by triad team 5   Anticipated DC Date:  01/16/2012   Anticipated DC Plan:  HOME W HOME HEALTH SERVICES      DC Planning Services  CM consult      [facility] Choice  DURABLE MEDICAL EQUIPMENT   Choice offered to / List presented to:  C-1 Patient   DME arranged  Levan Hurst      DME agency  [facility]     HH arranged  HH-2 PT      [facility] agency  [facility]   Status of service:   Medicare Important Message given?   (If response is "NO", the following Medicare IM given date fields will be blank) Date Medicare IM given:   Date Additional Medicare IM given:    Discharge Disposition:    Per UR Regulation:    Comments:  2/4 phy ther rec hhpt-rw and 3n1 bsc. will speak w pt, spoke w pt. she has elevated commode seat and tub seat. she would like rolling walker and would like hhpt if phy ther still rec. shw was seen this am and await phy ther update. no pref to hhc agencies and will use [facility] if needed. debbie Erlean Mealor rn,bsn 1/31 debbie Brenisha Tsui rn,bsn [phone] MEDICARE-CERTIFIED HOME HEALTH AGENCIES [facility]   Agencies that are Medicare-Certified and affiliated with [facility]  Home Health Agency  Telephone Number Address  [facility]  The [facility] has ownership interest in this company; however, you are under no obligation to use this agency. [phone]  [address] [place] [address]    Agencies that are Medicare-Certified and are not affiliated with [facility] Agency Telephone Number Address  Aldine Contes [phone] Fax [phone] [address] [place], [place]   [address]  [facility] [phone] [address] [place], [place] [address]  [facility]  [phone] Fax [phone] [address]  [place], [place]  [address]  [facility] [phone] or [phone] [address] [place], [place] [address]  [facility] [phone] or [phone] [address] [employer] [place], [place]  [address]

## 2012-01-15 NOTE — Progress Notes (Signed)
PCP: Rudi Heap, MD, MD  Brief HPI:  66 year-old female with history of hypertension, hypothyroidism, TIA and alcoholism was brought to the ER with patient's son as patient was to be increasingly weak not able to walk even small distance. Patient has been having nausea and vomiting and unable to take in anything. Patient did not have any abdominal pain or diarrhea. Patient had cough and fever over a month ago and had gone to her PCP who prescribed her some antitussives and Zithromax. Again last week she went back to PCP because of the similar complaints and was placed on Levaquin. After taking Levaquin for 2 days patient had the intractable nausea and vomiting. Patient's son also had stomach flu. Patient denied any chest pain, shortness of breath or loss of consciousness. Patient drinks alcohol everyday but not for the 4 days prior to admission. Patient denies having take any salicylates or any other type of alcohol containing products. In the ER patient was found to be in acute renal failure with creatinine of 18 and bicarbonate of 12 with anion gap acidosis. Patient was initially admitted to SDU and then transferred to the floor.  Past medical history:  Past Medical History   Diagnosis  Date   .  Hypertension    .  Menopause    .  Hypothyroid    .  Mini stroke    .  Hyperlipidemia    .  B12 deficiency      Consultants: Nephrology (Dr. Hyman Hopes)  Procedures: None so far  Subjective: Patient continues to feel fine. No complaints offered. Wants to go home. Has been ambulating in the hallways.  Objective: Vital signs in last 24 hours: Temp:  [97.7 F (36.5 C)-98.5 F (36.9 C)] 97.7 F (36.5 C) (02/04 1191) Pulse Rate:  [66-75] 73  (02/04 0638) Resp:  [18-20] 20  (02/04 0638) BP: (130-155)/(63-81) 141/79 mmHg (02/04 0638) SpO2:  [98 %-100 %] 100 % (02/04 4782) Weight:  [75 kg (165 lb 5.5 oz)] 75 kg (165 lb 5.5 oz) (02/03 2238) Weight change: 0.4 kg (14.1 oz) Last BM Date:  01/13/12  Intake/Output from previous day: 02/03 0701 - 02/04 0700 In: 2565 [P.O.:840; I.V.:1725] Out: -  Intake/Output this shift:    General appearance: alert, cooperative, appears stated age and no distress Resp: clear to auscultation bilaterally Cardio: regular rate and rhythm, S1, S2 normal, no murmur, click, rub or gallop GI: soft, non-tender; bowel sounds normal; no masses,  no organomegaly Neurologic: Grossly normal  Lab Results:  Basename 01/13/12 0630  WBC 9.2  HGB 9.4*  HCT 26.2*  PLT 137*   BMET  Basename 01/14/12 0600 01/13/12 0630  NA 136 131*  K 3.2* 3.0*  CL 100 95*  CO2 21 24  GLUCOSE 75 89  BUN 72* 80*  CREATININE 11.23* 12.34*  CALCIUM 6.3* 6.3*    Studies/Results: No results found.  Medications:  Scheduled:    . calcium carbonate  1 tablet Oral TID WC  . docusate sodium  100 mg Oral BID  . folic acid  1 mg Oral Daily  . levothyroxine  50 mcg Oral QAC breakfast  . metoprolol tartrate  25 mg Oral BID  . mulitivitamin with minerals  1 tablet Oral Daily  . polyethylene glycol  17 g Oral BID  . potassium chloride  40 mEq Oral Once  . thiamine  100 mg Oral Daily  . DISCONTD: polyethylene glycol  17 g Oral Daily    Assessment/Plan:  Principal Problem:  *  Acute renal failure Active Problems:  HTN (hypertension)  Hyperlipidemia  TIA (transient ischemic attack)  Hypovolemia dehydration  Metabolic acidosis  Hyponatremia  Anemia  Alcoholism  B12 deficiency    1. Acute renal failure: Improving slowly. Await labs from today. Making urine per patient. But not being charted. No indications for hemodialysis. Nephrology has signed off. Metabolic acidosis has resolved. Repleted Potassium yesterday.  2. Nausea: Probably related to ARF. Improved.   3. HTN (hypertension): Stable. Continue metoprolol. Monitor BP closely.  4. Anemia/ B12 deficiency: Hemoglobin stable. No overt bleeding. Anemia panel reviewed.  5. Hypothyroidism: Stable. Dose  of synthroid increased. TSH markedly elevated at 68 with low FT4.   6. Hyperlipidemia: Statin on hold due to acute renal failure.   7. History of TIA (transient ischemic attack): Currently no symptoms. Was on Plavix prior to admission. Will need to hold until renal function improves.   8. Alcoholism: Continue CIWA protocol. Counseled to cut down on alcohol use.  PT and OT. Laxatives for constipation. SCD's for DVT prophylaxis. Patient is full code.  Disposition: Home when creatinine is improved to 4 or less. Anticipate discharge sometime this week.   LOS: 7 days   [facility] Pager [phone] 01/15/2012, 8:30 AM

## 2012-01-15 NOTE — Progress Notes (Signed)
Physical Therapy Treatment Patient Details Name: VAIDA KERCHNER MRN: [id number] DOB: 09/07/1946 Today's Date: 01/15/2012  PT Assessment/Plan  PT - Assessment/Plan Comments on Treatment Session: pt presents with renal failure and Etoh.  pt generally unsteady and would benefit from continued balance work.  pt is at risk for falls.   PT Plan: Discharge plan remains appropriate;Frequency needs to be updated PT Frequency: Min 3X/week Follow Up Recommendations: Home health PT;Supervision - Intermittent Equipment Recommended: Rolling walker with 5" wheels;3 in 1 bedside comode PT Goals  Acute Rehab PT Goals PT Goal: Supine/Side to Sit - Progress: Met PT Goal: Sit to Supine/Side - Progress: Progressing toward goal PT Transfer Goal: Bed to Chair/Chair to Bed - Progress: Progressing toward goal PT Goal: Ambulate - Progress: Progressing toward goal Additional Goals PT Goal: Additional Goal #1 - Progress: Progressing toward goal  PT Treatment Precautions/Restrictions  Precautions Precautions: Fall Required Braces or Orthoses: No Restrictions Weight Bearing Restrictions: No Mobility (including Balance) Bed Mobility Bed Mobility: Yes Supine to Sit: 7: Independent;HOB flat Sitting - Scoot to Edge of Bed: 7: Independent Transfers Transfers: Yes Sit to Stand: 7: Independent;From bed;With upper extremity assist Stand to Sit: 7: Independent;With upper extremity assist;To bed Ambulation/Gait Ambulation/Gait: Yes Ambulation/Gait Assistance: 4: Min assist Ambulation/Gait Assistance Details (indicate cue type and reason): pt with scissoring gait and occasional LOB laterally R=L.  pt with increased LOB and requiring A to prevent fall x2 with head turns and most notably looking up.  pt acknowledges LOB, but states she's fine.   Ambulation Distance (Feet): 250 Feet Assistive device: None Gait Pattern: Decreased stride length;Scissoring Stairs: No Wheelchair Mobility Wheelchair Mobility: No    Posture/Postural Control Posture/Postural Control: No significant limitations Exercise    End of Session PT - End of Session Equipment Utilized During Treatment: Gait belt Activity Tolerance: Patient tolerated treatment well Patient left: in bed;with call bell in reach (Sitting EOB) Nurse Communication: Mobility status for transfers;Mobility status for ambulation General Behavior During Session: [facility] for tasks performed Cognition: [facility] for tasks performed  Sunny Schlein, [place] [phone] 01/15/2012, 11:54 AM

## 2012-01-15 NOTE — Progress Notes (Signed)
Subjective: Awake, alert,hungry, wants to go home.  Son at bedside  Objective: Vital signs in last 24 hours: Blood pressure 148/90, pulse 65, temperature 98 F (36.7 C), temperature source Oral, resp. rate 18, height 5\' 9"  (1.753 m), weight 75 kg (165 lb 5.5 oz), SpO2 100.00%.   Intake/Output from previous day: 02/03 0701 - 02/04 0700 In: 2565 [P.O.:840; I.V.:1725] Out: -  Intake/Output this shift: Total I/O In: 480 [P.O.:480] Out: -  Wt Readings from Last 3 Encounters:  01/14/12 75 kg (165 lb 5.5 oz)    PHYSICAL EXAM General--awake, alert Chest--clear  Heart--no rub Abd--nontender Extr--trace edema  Lab Results:   Lab 01/15/12 0800 01/14/12 0600 01/13/12 0630  NA 135 136 131*  K 3.5 3.2* 3.0*  CL 103 100 95*  CO2 22 21 24   BUN 61* 72* 80*  CREATININE 9.38* 11.23* 12.34*  EGFR -- -- --  GLUCOSE 79 -- --  CALCIUM 6.7* 6.3* 6.3*  PHOS 5.0* 5.8* 5.9*      Basename 01/13/12 0630  WBC 9.2  HGB 9.4*  HCT 26.2*  PLT 137*     Basename 01/13/12 0630  IRON 45  TIBC 178*  FERRITIN 308*     No results found for this basename: PTH in the last 72 hours   Scheduled:   . calcium carbonate  1 tablet Oral TID WC  . docusate sodium  100 mg Oral BID  . folic acid  1 mg Oral Daily  . levothyroxine  50 mcg Oral QAC breakfast  . metoprolol tartrate  25 mg Oral BID  . mulitivitamin with minerals  1 tablet Oral Daily  . polyethylene glycol  17 g Oral BID  . thiamine  100 mg Oral Daily   Continuous:   . sodium chloride 75 mL/hr at 01/15/12 0429    Assessment/Plan: 1. ARF.  IV currently 75 cc/hr.  She appears to not be vol depleted any more.  Off Lisinopril.  Will d/c IVF, begin regular diet.  KCl 40 meq x 1 today 2. Metabolic acidosis. Likely secondary to ARF. Should improve as NS is d/cd 3. Hypothyroidism. Restart home dose levothyroxine. TSH elevated in acute setting likely due to illness and inability to ingest med at home. May need increased dose in long  term.  4. N/V. Improved  5. HTN. Improved, possibly worsened by EtOH withdrawal. Avoid ACEi, diuretics currently. Allergy to CCBs.  on metoprolol.  Could increase to 50 BID if BP remains out of goal range (120/70-130/80) 6.  Anemia--Fe/TIBC 25% sat and ferritin 308.  Will Rx feraheme 510 mg IV x 1    LOS: 7 days   [name] F 01/15/2012,10:57 AM

## 2012-01-16 LAB — RENAL FUNCTION PANEL
Albumin: 2.6 g/dL — ABNORMAL LOW (ref 3.5–5.2)
BUN: 52 mg/dL — ABNORMAL HIGH (ref 6–23)
CO2: 17 mEq/L — ABNORMAL LOW (ref 19–32)
Calcium: 7.1 mg/dL — ABNORMAL LOW (ref 8.4–10.5)
Chloride: 102 mEq/L (ref 96–112)
Creatinine, Ser: 7.81 mg/dL — ABNORMAL HIGH (ref 0.50–1.10)
GFR calc Af Amer: 6 mL/min — ABNORMAL LOW (ref >90)
GFR calc non Af Amer: 5 mL/min — ABNORMAL LOW (ref >90)
Glucose, Bld: 75 mg/dL (ref 70–99)
Phosphorus: 4.6 mg/dL (ref 2.3–4.6)
Potassium: 3.8 mEq/L (ref 3.5–5.1)
Sodium: 132 mEq/L — ABNORMAL LOW (ref 135–145)

## 2012-01-16 MED ORDER — DARBEPOETIN ALFA-POLYSORBATE 150 MCG/0.3ML IJ SOLN
150.0000 ug | INTRAMUSCULAR | Status: DC
  Administered 2012-01-16: 150 ug via SUBCUTANEOUS
  Filled 2012-01-16 (×2): qty 0.3

## 2012-01-16 MED ORDER — FERUMOXYTOL INJECTION 510 MG/17 ML: 510.0000 mg | Freq: Once | INTRAVENOUS | Status: DC

## 2012-01-16 NOTE — Progress Notes (Signed)
Subjective: Awake, alert, friendly, son at bedside  Objective: Vital signs in last 24 hours: Blood pressure 145/72, pulse 68, temperature 98.4 F (36.9 C), temperature source Oral, resp. rate 18, height 5\' 9"  (1.753 m), weight 78 kg (171 lb 15.3 oz), SpO2 100.00%.   Intake/Output from previous day: 02/04 0701 - 02/05 0700 In: 960 [P.O.:960] Out: -  Intake/Output this shift: Total I/O In: 1800 [P.O.:1800] Out: -  Wt Readings from Last 3 Encounters:  01/15/12 78 kg (171 lb 15.3 oz)     PHYSICAL EXAM General--awake, alert Chest--clear  Heart--no rub Abd--nontender Extr--no edema  Lab Results:   Lab 01/16/12 0609 01/15/12 0800 01/14/12 0600  NA 132* 135 136  K 3.8 3.5 3.2*  CL 102 103 100  CO2 17* 22 21  BUN 52* 61* 72*  CREATININE 7.81* 9.38* 11.23*  EGFR -- -- --  GLUCOSE 75 -- --  CALCIUM 7.1* 6.7* 6.3*  PHOS 4.6 5.0* 5.8*     No results found for this basename: WBC:2,HGB:2,HCT:2,PLT:2 in the last 72 hours  No results found for this basename: IRON,TIBC,FERRITIN in the last 72 hours   No results found for this basename: PTH in the last 72 hours   Scheduled:   . calcium carbonate  1 tablet Oral TID WC  . darbepoetin (ARANESP) injection - NON-DIALYSIS  150 mcg Subcutaneous Q Tue-1800  . docusate sodium  100 mg Oral BID  . ferumoxytol  510 mg Intravenous Once  . folic acid  1 mg Oral Daily  . levothyroxine  50 mcg Oral QAC breakfast  . metoprolol tartrate  25 mg Oral BID  . mulitivitamin with minerals  1 tablet Oral Daily  . polyethylene glycol  17 g Oral BID  . potassium chloride  40 mEq Oral BID  . thiamine  100 mg Oral Daily  . DISCONTD: ferumoxytol  510 mg Intravenous Once   Continuous:   Assessment/Plan:  1. ARF.   Off IV fluids, Cr continues to decrease each day.  K not low today.  When she's d/c'd she'll need to have lab done at least 3 times/wk at Dr. Kathi Der office in [place] to be sure Cr continues to fall and that K doesn't get too low (in  diuretic phase of ATN) 2. Metabolic acidosis. Likely secondary to ARF. Should improve. 3. Hypothyroidism. Restart home dose levothyroxine. TSH elevated in acute setting likely due to illness and inability to ingest med at home. May need increased dose in long term.  4. N/V. Improved  5. HTN. Improved, possibly worsened by EtOH withdrawal. Avoid ACEi, diuretics currently. Allergy to CCBs. on metoprolol. Could increase to 50 BID if BP remains out of goal range (120/70-130/80) BP OK today 6. Anemia--Fe/TIBC 25% sat and ferritin 308. Will Rx feraheme 510 mg IV x 1.   Begin Aranesp today 150 mcg subq     Got Feraheme 510 IV on 5 Feb and to get another 510 mg on 8 Feb  LOS: 8 days   Was on cymbalta and lisinopril PTA.  I would avoid lisinopril unless BP is difficult to control, and wouldn't use until Cr falls to baseline. 01/16/2012,2:34 PM

## 2012-01-16 NOTE — Progress Notes (Signed)
PCP: Rudi Heap, MD, MD  Brief HPI:  66 year-old female with history of hypertension, hypothyroidism, TIA and alcoholism was brought to the ER with patient's son as patient was to be increasingly weak not able to walk even small distance. Patient has been having nausea and vomiting and unable to take in anything. Patient did not have any abdominal pain or diarrhea. Patient had cough and fever over a month ago and had gone to her PCP who prescribed her some antitussives and Zithromax. Again last week she went back to PCP because of the similar complaints and was placed on Levaquin. After taking Levaquin for 2 days patient had the intractable nausea and vomiting. Patient's son also had stomach flu. Patient denied any chest pain, shortness of breath or loss of consciousness. Patient drinks alcohol everyday but not for the 4 days prior to admission. Patient denies having take any salicylates or any other type of alcohol containing products. In the ER patient was found to be in acute renal failure with creatinine of 18 and bicarbonate of 12 with anion gap acidosis. Patient was initially admitted to SDU and then transferred to the floor.  Past medical history:  Past Medical History   Diagnosis  Date   .  Hypertension    .  Menopause    .  Hypothyroid    .  Mini stroke    .  Hyperlipidemia    .  B12 deficiency      Consultants: Nephrology (Dr. Hyman Hopes)  Procedures: None so far  Subjective: Patient continues to feel fine. Making urine. Tolerating diet. No complaints offered. Wants to go home.  Objective: Vital signs in last 24 hours: Temp:  [97.9 F (36.6 C)-98.5 F (36.9 C)] 98.4 F (36.9 C) (02/05 1300) Pulse Rate:  [64-81] 68  (02/05 1300) Resp:  [18-20] 18  (02/05 1300) BP: (111-145)/(62-87) 145/72 mmHg (02/05 1300) SpO2:  [98 %-100 %] 100 % (02/05 1300) Weight:  [78 kg (171 lb 15.3 oz)] 78 kg (171 lb 15.3 oz) (02/04 2121) Weight change: 3 kg (6 lb 9.8 oz) Last BM Date:  01/16/12  Intake/Output from previous day: 02/04 0701 - 02/05 0700 In: 960 [P.O.:960] Out: -  Intake/Output this shift: Total I/O In: 1800 [P.O.:1800] Out: -   General appearance: alert, cooperative, appears stated age and no distress Resp: clear to auscultation bilaterally Cardio: regular rate and rhythm, S1, S2 normal, no murmur, click, rub or gallop Neurologic: Grossly normal  Lab Results: No results found for this basename: WBC:2,HGB:2,HCT:2,PLT:2 in the last 72 hours BMET  Basename 01/16/12 0609 01/15/12 0800  NA 132* 135  K 3.8 3.5  CL 102 103  CO2 17* 22  GLUCOSE 75 79  BUN 52* 61*  CREATININE 7.81* 9.38*  CALCIUM 7.1* 6.7*    Studies/Results: No results found.  Medications:  Scheduled:    . calcium carbonate  1 tablet Oral TID WC  . docusate sodium  100 mg Oral BID  . folic acid  1 mg Oral Daily  . levothyroxine  50 mcg Oral QAC breakfast  . metoprolol tartrate  25 mg Oral BID  . mulitivitamin with minerals  1 tablet Oral Daily  . polyethylene glycol  17 g Oral BID  . potassium chloride  40 mEq Oral BID  . thiamine  100 mg Oral Daily  . DISCONTD: ferumoxytol  510 mg Intravenous Once    Assessment/Plan:  Principal Problem:  *Acute renal failure Active Problems:  HTN (hypertension)  Hyperlipidemia  TIA (  transient ischemic attack)  Hypovolemia dehydration  Metabolic acidosis  Hyponatremia  Anemia  Alcoholism  B12 deficiency    1. Acute renal failure: Prerenal due to nausea and vomiting. Improving slowly. Making urine per patient. Metabolic acidosis has resolved. Nephrology following. Will need to wait till creatinine is close to 4. Then will need close follow up with her PCP and with a nephrologist (either [facility] or Dr. Kristian Covey in [place]).  2. Nausea: Probably related to ARF. Improved.   3. HTN (hypertension): Stable. Continue metoprolol. Monitor BP closely.  4. Anemia/ B12 deficiency: Hemoglobin stable. No overt bleeding.  Anemia panel reviewed.  5. Hypothyroidism: Stable. Dose of synthroid increased. TSH was markedly elevated at 68 with low FT4.   6. Hyperlipidemia: Statin on hold due to acute renal failure.   7. History of TIA (transient ischemic attack): Currently no symptoms. Was on Plavix prior to admission. Can resume on discharge.   8. Alcoholism: Continue CIWA protocol. Counseled to cut down on alcohol use. Social work to see.  PT and OT. Laxatives for constipation. SCD's for DVT prophylaxis. Patient is full code.  Disposition: Home when creatinine is improved to 4 or less. Anticipate discharge sometime this week.   LOS: 8 days   [facility] Pager [phone] 01/16/2012, 1:52 PM

## 2012-01-16 NOTE — Progress Notes (Signed)
Physical Therapy Treatment Patient Details Name: [name] MRN: [id number] DOB: Oct 13, 1946 Today's Date: 01/16/2012  PT Assessment/Plan  PT - Assessment/Plan Comments on Treatment Session: Pt demonstrates safety and independence with mobility and is appropriate for discharge to home when cleared by MD.  Pt received rolling walker but demonstrates no need for assistive device at this time.  Pt also reports no room in her home to use a walker.  No further acute PT needs at this time.  Acute PT signing off.  Pt and family agree.   PT Plan: Discharge plan remains appropriate;All goals met and education completed, patient dischaged from PT services Follow Up Recommendations: No PT follow up Equipment Recommended: None recommended by PT PT Goals  Acute Rehab PT Goals PT Goal Formulation: With patient/family Time For Goal Achievement: 7 days Pt will go Supine/Side to Sit: Independently;with HOB 0 degrees PT Goal: Supine/Side to Sit - Progress: Met Pt will go Sit to Supine/Side: Independently;with HOB 0 degrees PT Goal: Sit to Supine/Side - Progress: Met Pt will Transfer Bed to Chair/Chair to Bed: Independently PT Transfer Goal: Bed to Chair/Chair to Bed - Progress: Met Pt will Ambulate: >150 feet;with modified independence;with least restrictive assistive device PT Goal: Ambulate - Progress: Met Additional Goals Additional Goal #1: Pt will perform DGI within safe limits.   PT Goal: Additional Goal #1 - Progress: Met  PT Treatment Precautions/Restrictions  Precautions Precautions: Fall Required Braces or Orthoses: No Restrictions Weight Bearing Restrictions: No Mobility (including Balance) Bed Mobility Bed Mobility: No Transfers Transfers: No Ambulation/Gait Ambulation/Gait: Yes Ambulation/Gait Assistance: 7: Independent Ambulation/Gait Assistance Details (indicate cue type and reason): No LOB throughout session. Gait is still unsteady and pt continues to have narrow base of  support when ambulating Ambulation Distance (Feet): 200 Feet Assistive device: None Gait Pattern: Decreased stride length;Scissoring Stairs: Yes Stair Management Technique: No rails Number of Stairs: 1  Wheelchair Mobility Wheelchair Mobility: No  Posture/Postural Control Posture/Postural Control: No significant limitations Balance Balance Assessed: Yes Dynamic Gait Index Level Surface: Normal Change in Gait Speed: Normal Gait with Horizontal Head Turns: Normal Gait with Vertical Head Turns: Normal Gait and Pivot Turn: Normal Step Over Obstacle: Mild Impairment Step Around Obstacles: Normal Steps: Normal Total Score: 23  High Level Balance High Level Balance Activites: Sudden stops;Head turns;Direction changes;Side stepping Exercise    End of Session PT - End of Session Equipment Utilized During Treatment: Gait belt Activity Tolerance: Patient tolerated treatment well Patient left: with call bell in reach;in chair;with family/visitor present Nurse Communication: Mobility status for transfers;Mobility status for ambulation General Behavior During Session: [facility] for tasks performed Cognition: [facility] for tasks performed  Andora Krull 01/16/2012, 4:33 PM Liberty Stead L. Kiaja Shorty DPT [phone]

## 2012-01-17 LAB — RENAL FUNCTION PANEL
Albumin: 2.7 g/dL — ABNORMAL LOW (ref 3.5–5.2)
BUN: 48 mg/dL — ABNORMAL HIGH (ref 6–23)
CO2: 21 mEq/L (ref 19–32)
Calcium: 7.5 mg/dL — ABNORMAL LOW (ref 8.4–10.5)
Chloride: 105 mEq/L (ref 96–112)
Creatinine, Ser: 7.22 mg/dL — ABNORMAL HIGH (ref 0.50–1.10)
GFR calc Af Amer: 6 mL/min — ABNORMAL LOW (ref >90)
GFR calc non Af Amer: 5 mL/min — ABNORMAL LOW (ref >90)
Glucose, Bld: 75 mg/dL (ref 70–99)
Phosphorus: 4.3 mg/dL (ref 2.3–4.6)
Potassium: 3.7 mEq/L (ref 3.5–5.1)
Sodium: 137 mEq/L (ref 135–145)

## 2012-01-17 MED ORDER — ADULT MULTIVITAMIN W/MINERALS CH: 1.0000 | ORAL_TABLET | Freq: Every day | ORAL | Status: DC

## 2012-01-17 MED ORDER — LEVOTHYROXINE SODIUM 50 MCG PO TABS: 50.0000 ug | ORAL_TABLET | Freq: Every day | ORAL | Status: DC

## 2012-01-17 MED ORDER — FOLIC ACID 1 MG PO TABS: 1.0000 mg | ORAL_TABLET | Freq: Every day | ORAL | Status: DC

## 2012-01-17 MED ORDER — CALCIUM CARBONATE ANTACID 500 MG PO CHEW: 1.0000 | CHEWABLE_TABLET | Freq: Three times a day (TID) | ORAL | Status: AC

## 2012-01-17 MED ORDER — THIAMINE HCL 100 MG PO TABS: 100.0000 mg | ORAL_TABLET | Freq: Every day | ORAL | Status: DC

## 2012-01-17 MED ORDER — METOPROLOL TARTRATE 25 MG PO TABS: 25.0000 mg | ORAL_TABLET | Freq: Two times a day (BID) | ORAL | Status: DC

## 2012-01-17 NOTE — Progress Notes (Signed)
Subjective: Ready to go home.  Son in room. Making lots of urine  Objective: Vital signs in last 24 hours: Blood pressure 136/72, pulse 80, temperature 98.5 F (36.9 C), temperature source Oral, resp. rate 18, height 5\' 9"  (1.753 m), weight 76.6 kg (168 lb 14 oz), SpO2 100.00%.   Intake/Output from previous day: 02/05 0701 - 02/06 0700 In: 2175 [P.O.:2175] Out: -  Intake/Output this shift:   Wt Readings from Last 3 Encounters:  01/16/12 76.6 kg (168 lb 14 oz)     PHYSICAL EXAM General--alert, friendly Chest--clear Heart--no rub Abd--nontender Extr--no edema  Lab Results:   Lab 01/17/12 0535 01/16/12 0609 01/15/12 0800  NA 137 132* 135  K 3.7 3.8 3.5  CL 105 102 103  CO2 21 17* 22  BUN 48* 52* 61*  CREATININE 7.22* 7.81* 9.38*  EGFR -- -- --  GLUCOSE 75 -- --  CALCIUM 7.5* 7.1* 6.7*  PHOS 4.3 4.6 5.0*     No results found for this basename: WBC:2,HGB:2,HCT:2,PLT:2 in the last 72 hours  No results found for this basename: IRON,TIBC,FERRITIN in the last 72 hours   No results found for this basename: PTH in the last 72 hours   Scheduled:   . calcium carbonate  1 tablet Oral TID WC  . darbepoetin (ARANESP) injection - NON-DIALYSIS  150 mcg Subcutaneous Q Tue-1800  . docusate sodium  100 mg Oral BID  . ferumoxytol  510 mg Intravenous Once  . folic acid  1 mg Oral Daily  . levothyroxine  50 mcg Oral QAC breakfast  . metoprolol tartrate  25 mg Oral BID  . mulitivitamin with minerals  1 tablet Oral Daily  . polyethylene glycol  17 g Oral BID  . thiamine  100 mg Oral Daily   Continuous:   Assessment/Plan: 1.  ARF--Cr improving--certainly not at baseline.  She'll have lab checked frequently at Dr. Kathi Der office (her primary care physician).  Need to watch for hypokalemia and vol depletion in diuretic phase of ATN 2.  Met acidosis--reslved 3.  Hypothyroid--no new recs 4.  N & V --improved 5.  High BP--136/72 today 6.  Anemia--Got 1 dose Iv ferahema and 1  dose Aranesp 150 mcg.  Hgb will also need to be checked in Dr. Kathi Der office    LOS: 9 days   Yoanna Jurczyk F 01/17/2012,1:42 PM   .labalb

## 2012-01-17 NOTE — Progress Notes (Signed)
Patient discussed at the Long Length of Stay [name] 01/17/2012  

## 2012-01-17 NOTE — Progress Notes (Signed)
Pt. discharged to home after d/c summary reviewed and pt capable of re verbalizing medications and follow up appointments. Pt remains stable. No signs and symptoms of distress. Educated to return to ER in the event of SOB, dizziness, chest pain, or fainting. Roxie Gueye, RN  

## 2012-01-17 NOTE — Discharge Summary (Signed)
[name] MRN: [id number] DOB/AGE: Dec 18, 1945 66 y.o.  Admit date: 01/08/2012 Discharge date: 01/17/2012  Primary Care Physician:  Rudi Heap, MD, MD   Discharge Diagnoses:   Patient Active Problem List  Diagnoses  . Acute renal failure  . HTN (hypertension)  . Hyperlipidemia  . TIA (transient ischemic attack)  . Hypovolemia dehydration  . Metabolic acidosis  . Hyponatremia  . Anemia  . Alcoholism  . B12 deficiency    DISCHARGE MEDICATION: Medication List  As of 01/17/2012 12:14 PM   STOP taking these medications         amLODipine 5 MG tablet      clopidogrel 75 MG tablet      DULoxetine 60 MG capsule      hydrochlorothiazide 25 MG tablet      levofloxacin 750 MG tablet      lisinopril 40 MG tablet      PROBIOTIC PO      simvastatin 40 MG tablet         TAKE these medications         acetaminophen 325 MG tablet   Commonly known as: TYLENOL   Take 325 mg by mouth every 6 (six) hours as needed. For pain      albuterol 108 (90 BASE) MCG/ACT inhaler   Commonly known as: PROVENTIL HFA;VENTOLIN HFA   Inhale 2 puffs into the lungs 2 (two) times daily as needed. For wheezing      budesonide-formoterol 80-4.5 MCG/ACT inhaler   Commonly known as: SYMBICORT   Inhale 1 puff into the lungs daily.      calcium carbonate 500 MG chewable tablet   Commonly known as: TUMS - dosed in mg elemental calcium   Chew 1 tablet (200 mg of elemental calcium total) by mouth 3 (three) times daily with meals.      estrogen-methylTESTOSTERone 0.625-1.25 MG per tablet   Commonly known as: ESTRATEST HS   Take 1 tablet by mouth daily.      folic acid 1 MG tablet   Commonly known as: FOLVITE   Take 1 tablet (1 mg total) by mouth daily.      HYDROcodone-homatropine 5-1.5 MG/5ML syrup   Commonly known as: HYCODAN   Take 5 mLs by mouth every 6 (six) hours as needed. For cough      levothyroxine 50 MCG tablet   Commonly known as: SYNTHROID, LEVOTHROID   Take 1 tablet (50 mcg  total) by mouth daily before breakfast.      metoprolol tartrate 25 MG tablet   Commonly known as: LOPRESSOR   Take 1 tablet (25 mg total) by mouth 2 (two) times daily.      mulitivitamin with minerals Tabs   Take 1 tablet by mouth daily.      rosuvastatin 20 MG tablet   Commonly known as: CRESTOR   Take 20 mg by mouth daily.      thiamine 100 MG tablet   Take 1 tablet (100 mg total) by mouth daily.              Consults: Treatment Team:  Garnetta Buddy, MD   SIGNIFICANT DIAGNOSTIC STUDIES:  Dg Chest 2 View  01/09/2012  *RADIOLOGY REPORT*  Clinical Data: Cough, body pain, weakness, recent pneumonia  CHEST - 2 VIEW  Comparison: None  Findings: Normal heart size, mediastinal contours, and pulmonary vascularity. Emphysematous minimal bronchitic changes. Poorly defined density at lateral right apex may represent pleural thickening or scarring but nodule is not excluded.  Remaining lungs clear. No acute infiltrate, pleural effusion or pneumothorax. Bones appear demineralized.  IMPRESSION: Emphysematous and minimal bronchitic changes. No definite acute infiltrate. Question scarring at right apex though nodule is not completely excluded; if the patient has prior outside chest radiographs, these would be of benefit to establish stability of this finding. In the absence of prior studies, follow-up noncontrast CT chest would be recommended to exclude pulmonary nodule.  Original Report Authenticated By: Lollie Marrow, M.D.   US Renal  01/09/2012  *RADIOLOGY REPORT*  Clinical Data: History of acute renal failure.  RENAL/URINARY TRACT ULTRASOUND COMPLETE  Comparison:  None.  Findings:  Right Kidney:  Right renal length is 12.4 cm.  Left Kidney:  Left renal length is 11.8 cm.  Examination of each kidney shows no evidence of hydronephrosis, solid or cystic mass, calculus, parenchymal loss, or parenchymal textural abnormality.  Bladder:  No urinary bladder abnormality is evident.  IMPRESSION: No renal  abnormality is demonstrated by ultrasound.  Original Report Authenticated By: Crawford Givens, M.D.         Recent Results (from the past 240 hour(s))  MRSA PCR SCREENING     Status: Normal   Collection Time   01/09/12  5:05 AM      Component Value Range Status Comment   MRSA by PCR NEGATIVE  NEGATIVE  Final     BRIEF ADMITTING H & P: 66 year-old female with history of hypertension, hypothyroidism, TIA and alcoholism was brought to the ER with patient's son as patient was to be increasingly weak not able to walk even small distance. Patient has been having nausea vomiting unable to take in anything over the last for 5 days. Patient does not have any abdominal pain or diarrhea. Patient had cough and fever over a month ago and had gone to her PCP who prescribed her some antitussives and Zithromax. Again last week she went back to PCP because of the similar complaints and was placed on Levaquin. After taking Levaquin for 2 days patient had the intractable nausea and vomiting. Patient's son also had stomach flu. Patient denies any chest pain shortness of breath loss of consciousness. Patient drinks alcohol everyday but not for last 4 days. Patient denies having take any salicylates or any other type of alcohol containing products. In the ER patient was found to be in acute renal failure with creatinine of 18 and bicarbonate of 12 with anion gap acidosis. ABG confirms the acidosis. Patient will be admitted for further management.   Hospital Course:  Present on Admission:  .Acute renal failure: The patient presented with profound acute renal failure with a BUN of 126 and creatinine of 18.07. Given the patient's presenting history it was felt that this was all secondary to hypovolemia prerenal state and the patient was initially treated with aggressive volume resuscitation. The patient had only a very mild elevation of potassium at 5.2. She was seen in consultation by nephrology who agreed with hydration and  removal of any offending agents. The patient had been on lisinopril prior to her hospitalization and this was discontinued at this time. The patient has had a down trend in BUN and creatinine. She was taken off of IV fluids more than 24 hours ago and she still continues to have the progressive decrease of her BUN and creatinine. At the time of discharge the patient has a BUN of 48 and a creatinine of 7.22 which is decreased from BUN of 58 and creatinine 7.81 yesterday. Recommendations are that the  patient should have her metabolic profile checked on 3 times a week basis until back to normal. Lisinopril should continue to be withheld until the patient's creatinine is at normal and then reevaluate at that time for restarting. The patient was initially placed on renal diet however in light of the fact that she did not have any hyperkalemia the patient's diet was changed to a heart healthy diet. The patient is being discharged home on a heart healthy diet. I spoken with her primary care physician Dr. Alden Server and conveyed the above information to him.   Marland KitchenHTN (hypertension): The patient is being treated for hypertension prior to hospitalization. She had been on Norvasc and lisinopril. These were both discontinued and metoprolol was substituted for them. This was done due to the fact that the patient had significant acute renal failure which could be contributed to by the lisinopril and also had a hypokalemia. Thus I would avoid calcium channel blockers until the patient's calcium is back to normal. At the time of discharge the patient has a blood pressure of 136/72.   Marland KitchenHypovolemia dehydration: As noted earlier the patient had profound hypovolemic dehydration arrival. It is unclear as to why the patient was still severely dehydrated however this might have been a reaction to the Levaquin which the patient was taken for treatment of a twinge acquired pneumonia diagnosed in the office. The Levaquin was not restarted  here in the hospital as the patient's chest x-ray did not show any evidence of a pneumonia.   .Hyperlipidemia: The patient is on simvastatin 40. The patient was resumed on Norvasc simvastatin 40 mg should not be used if the patient is a contraindication. However at this time the patient is being discharged on Crestor.   .Metabolic acidosis: As noted the patient had a profound metabolic acidosis upon arrival with a bicarbonate of 12. The patient was given a bicarbonate drip in addition to aggressive hydration and at the time of discharge the bicarbonate level is 21 and the blood.   Marland KitchenHyponatremia: The patient was hyponatremic with a sodium of 128 on admission. This was felt to be secondary to a hypovolemic state. With hydration the patient's sodium is now normal in the blood the sodium level of 137 at time of discharge.  .Anemia: The patient was found to have an anemia with a hemoglobin level of 9.4 hematocrit of 26.2  at the time of discharge. The patient had no evidence of bleeding and her iron studies showed no abnormalities. Please note that on arrival the patient's hemoglobin was 12.6 however that was felt to be a hemoconcentrated state, and that the anemia was delutional as result of aggressive hydration.   .Alcoholism: The patient is known to be a habitual drinker. I suspect that this probably led to a prerenal state in addition to her nausea and vomiting. She is being treated with thiamine and folate and has been counseled against further alcohol use. The patient did not have any symptoms of withdrawal however she was treated with Ativan initially for agitation.   .B12 deficiency: Secondary to alcohol use   .Hypocalcemia patient is found to be hypocalcemic on admission showed no significant signs associated with it. Corrected for albumin, the patient is just mildly low. The patient has been taking TUMS her in the hospital and should continue taking that and have her calcium levels checked in about  one week.  Disposition and Follow-up:  The patient is being discharged home on a heart healthy diet. She is to  followup with her primary care physician for an office visit in one week and to have her labs checked in 2 days. I spoken with her primary care physician Dr. Alden Server discussed all the above with him and he expects to the patient in the office on Friday to have her metabolic profile evaluated. He is asked to give the patient a prescription for the labs to be done and this was given to the patient at the time of discharge.   DISCHARGE EXAM:  General: Alert, awake, oriented x3, in no acute distress.  Vital Signs:Blood pressure 136/72, pulse 80, temperature 98.5 F (36.9 C), temperature source Oral, resp. rate 18,  height 5\' 9"  (1.753 m), weight 76.6 kg (168 lb 14 oz), SpO2 100.00%. HEENT: [place]/AT PEERL, EOMI  Neck: Trachea midline, no masses, no thyromegal,y no JVD, no carotid bruit  OROPHARYNX: Moist, No exudate/ erythema/lesions.  Heart: Regular rate and rhythm, without murmurs, rubs, gallops, PMI non-displaced, no heaves or thrills on palpation.  Lungs: Clear to auscultation, no wheezing or rhonchi noted. No increased vocal fremitus resonant to percussion  Abdomen: Soft, nontender, nondistended, positive bowel sounds, no masses no hepatosplenomegaly noted..  Neuro: No focal neurological deficits noted cranial nerves II through XII grossly intact. DTRs 2+ bilaterally upper and lower extremities. Strength 5 out of 5 in bilateral upper and lower extremities.  Musculoskeletal: No warm swelling or erythema around joints, no spinal tenderness noted.  Psychiatric: Patient alert and oriented x3, good insight and cognition, good recent to remote recall.  Lymph node survey: No cervical axillary or inguinal lymphadenopathy noted.    Basename 01/17/12 0535 01/16/12 0609  NA 137 132*  K 3.7 3.8  CL 105 102  CO2 21 17*  GLUCOSE 75 75  BUN 48* 52*  CREATININE 7.22* 7.81*  CALCIUM 7.5* 7.1*   MG -- --  PHOS 4.3 4.6    Basename 01/17/12 0535 01/16/12 0609  AST -- --  ALT -- --  ALKPHOS -- --  BILITOT -- --  PROT -- --  ALBUMIN 2.7* 2.6*   No results found for this basename: LIPASE:2,AMYLASE:2 in the last 72 hours No results found for this basename: WBC:2,NEUTROABS:2,HGB:2,HCT:2,MCV:2,PLT:2 in the last 72 hours  Total time for this discharge process including face-to-face time approximately 45 minutes  Signed: MATTHEWS,MICHELLE A. 01/17/2012, 12:14 PM

## 2012-04-03 ENCOUNTER — Other Ambulatory Visit: Payer: BC Managed Care – PPO

## 2012-04-03 ENCOUNTER — Ambulatory Visit
Admission: RE | Admit: 2012-04-03 | Discharge: 2012-04-03 | Disposition: A | Discharge status: Home / Self Care | Payer: BC Managed Care – PPO | Source: Ambulatory Visit | Attending: Family Medicine | Admitting: Family Medicine

## 2012-04-03 DIAGNOSIS — R911 Solitary pulmonary nodule: Secondary | ICD-10-CM

## 2012-06-14 ENCOUNTER — Encounter (HOSPITAL_COMMUNITY): Payer: Self-pay | Admitting: Pharmacy Technician

## 2012-06-14 NOTE — Patient Instructions (Addendum)
Your procedure is scheduled on: 06/20/2012  Report to [facility] at  730       AM.  Call this number if you have problems the morning of surgery: [phone]   Do not eat food or drink liquids :After Midnight.      Take these medicines the morning of surgery with A SIP OF WATER:hycodan,synthroid,metoprolol, diovan, hydrochlorothiazide, estrogen. Take your albuterol before you come and bring it with you.   Do not wear jewelry, make-up or nail polish.  Do not wear lotions, powders, or perfumes. You may wear deodorant.  Do not shave 48 hours prior to surgery.  Do not bring valuables to the hospital.  Contacts, dentures or bridgework may not be worn into surgery.  Leave suitcase in the car. After surgery it may be brought to your room.  For patients admitted to the hospital, checkout time is 11:00 AM the day of discharge.   Patients discharged the day of surgery will not be allowed to drive home.  :     Please read over the following fact sheets that you were given: Coughing and Deep Breathing, Surgical Site Infection Prevention, Anesthesia Post-op Instructions and Care and Recovery After Surgery    Cataract A cataract is a clouding of the lens of the eye. When a lens becomes cloudy, vision is reduced based on the degree and nature of the clouding. Many cataracts reduce vision to some degree. Some cataracts make people more near-sighted as they develop. Other cataracts increase glare. Cataracts that are ignored and become worse can sometimes look white. The white color can be seen through the pupil. CAUSES   Aging. However, cataracts may occur at any age, even in newborns.   Certain drugs.   Trauma to the eye.   Certain diseases such as diabetes.   Specific eye diseases such as chronic inflammation inside the eye or a sudden attack of a rare form of glaucoma.   Inherited or acquired medical problems.  SYMPTOMS   Gradual, progressive drop in vision in the affected eye.   Severe,  rapid visual loss. This most often happens when trauma is the cause.  DIAGNOSIS  To detect a cataract, an eye doctor examines the lens. Cataracts are best diagnosed with an exam of the eyes with the pupils enlarged (dilated) by drops.  TREATMENT  For an early cataract, vision may improve by using different eyeglasses or stronger lighting. If that does not help your vision, surgery is the only effective treatment. A cataract needs to be surgically removed when vision loss interferes with your everyday activities, such as driving, reading, or watching TV. A cataract may also have to be removed if it prevents examination or treatment of another eye problem. Surgery removes the cloudy lens and usually replaces it with a substitute lens (intraocular lens, IOL).  At a time when both you and your doctor agree, the cataract will be surgically removed. If you have cataracts in both eyes, only one is usually removed at a time. This allows the operated eye to heal and be out of danger from any possible problems after surgery (such as infection or poor wound healing). In rare cases, a cataract may be doing damage to your eye. In these cases, your caregiver may advise surgical removal right away. The vast majority of people who have cataract surgery have better vision afterward. HOME CARE INSTRUCTIONS  If you are not planning surgery, you may be asked to do the following:  Use different eyeglasses.  Use stronger or brighter lighting.   Ask your eye doctor about reducing your medicine dose or changing medicines if it is thought that a medicine caused your cataract. Changing medicines does not make the cataract go away on its own.   Become familiar with your surroundings. Poor vision can lead to injury. Avoid bumping into things on the affected side. You are at a higher risk for tripping or falling.   Exercise extreme care when driving or operating machinery.   Wear sunglasses if you are sensitive to bright  light or experiencing problems with glare.  SEEK IMMEDIATE MEDICAL CARE IF:   You have a worsening or sudden vision loss.   You notice redness, swelling, or increasing pain in the eye.   You have a fever.  Document Released: 11/27/2005 Document Revised: 11/16/2011 Document Reviewed: 07/21/2011 [facility] Patient Information 2012 [place], [place].PATIENT INSTRUCTIONS POST-ANESTHESIA  IMMEDIATELY FOLLOWING SURGERY:  Do not drive or operate machinery for the first twenty four hours after surgery.  Do not make any important decisions for twenty four hours after surgery or while taking narcotic pain medications or sedatives.  If you develop intractable nausea and vomiting or a severe headache please notify your doctor immediately.  FOLLOW-UP:  Please make an appointment with your surgeon as instructed. You do not need to follow up with anesthesia unless specifically instructed to do so.  WOUND CARE INSTRUCTIONS (if applicable):  Keep a dry clean dressing on the anesthesia/puncture wound site if there is drainage.  Once the wound has quit draining you may leave it open to air.  Generally you should leave the bandage intact for twenty four hours unless there is drainage.  If the epidural site drains for more than 36-48 hours please call the anesthesia department.  QUESTIONS?:  Please feel free to call your physician or the hospital operator if you have any questions, and they will be happy to assist you.

## 2012-06-17 ENCOUNTER — Encounter (HOSPITAL_COMMUNITY): Payer: Self-pay

## 2012-06-17 ENCOUNTER — Encounter (HOSPITAL_COMMUNITY)
Admission: RE | Admit: 2012-06-17 | Discharge: 2012-06-17 | Disposition: A | Discharge status: Home / Self Care | Payer: BC Managed Care – PPO | Source: Ambulatory Visit | Attending: Ophthalmology | Admitting: Ophthalmology

## 2012-06-17 LAB — BASIC METABOLIC PANEL
BUN: 15 mg/dL (ref 6–23)
CO2: 22 mEq/L (ref 19–32)
Calcium: 9.7 mg/dL (ref 8.4–10.5)
Chloride: 97 mEq/L (ref 96–112)
Creatinine, Ser: 1.5 mg/dL — ABNORMAL HIGH (ref 0.50–1.10)
GFR calc Af Amer: 41 mL/min — ABNORMAL LOW (ref >90)
GFR calc non Af Amer: 36 mL/min — ABNORMAL LOW (ref >90)
Glucose, Bld: 98 mg/dL (ref 70–99)
Potassium: 3.9 mEq/L (ref 3.5–5.1)
Sodium: 135 mEq/L (ref 135–145)

## 2012-06-17 LAB — HEMOGLOBIN AND HEMATOCRIT, BLOOD
HCT: 31.8 % — ABNORMAL LOW (ref 36.0–46.0)
Hemoglobin: 10.7 g/dL — ABNORMAL LOW (ref 12.0–15.0)

## 2012-06-19 MED ORDER — TETRACAINE HCL 0.5 % OP SOLN
OPHTHALMIC | Status: AC
  Filled 2012-06-19: qty 2

## 2012-06-19 MED ORDER — CYCLOPENTOLATE-PHENYLEPHRINE 0.2-1 % OP SOLN
OPHTHALMIC | Status: AC
  Administered 2012-06-20: 1 [drp] via OPHTHALMIC
  Filled 2012-06-19: qty 2

## 2012-06-19 MED ORDER — PHENYLEPHRINE HCL 2.5 % OP SOLN
OPHTHALMIC | Status: AC
  Administered 2012-06-20: 1 [drp] via OPHTHALMIC
  Filled 2012-06-19: qty 2

## 2012-06-19 MED ORDER — LIDOCAINE HCL 3.5 % OP GEL
OPHTHALMIC | Status: AC
  Filled 2012-06-19: qty 5

## 2012-06-19 MED ORDER — NEOMYCIN-POLYMYXIN-DEXAMETH 3.5-10000-0.1 OP OINT
TOPICAL_OINTMENT | OPHTHALMIC | Status: AC
  Filled 2012-06-19: qty 3.5

## 2012-06-19 MED ORDER — LIDOCAINE HCL (PF) 1 % IJ SOLN
INTRAMUSCULAR | Status: AC
  Filled 2012-06-19: qty 2

## 2012-06-20 ENCOUNTER — Encounter (HOSPITAL_COMMUNITY): Payer: Self-pay | Admitting: Anesthesiology

## 2012-06-20 ENCOUNTER — Ambulatory Visit (HOSPITAL_COMMUNITY): Payer: BC Managed Care – PPO | Admitting: Anesthesiology

## 2012-06-20 ENCOUNTER — Encounter (HOSPITAL_COMMUNITY): Payer: Self-pay | Admitting: *Deleted

## 2012-06-20 ENCOUNTER — Ambulatory Visit (HOSPITAL_COMMUNITY)
Admission: RE | Admit: 2012-06-20 | Discharge: 2012-06-20 | Disposition: A | Discharge status: Home / Self Care | Payer: BC Managed Care – PPO | Source: Ambulatory Visit | Attending: Ophthalmology | Admitting: Ophthalmology

## 2012-06-20 ENCOUNTER — Encounter (HOSPITAL_COMMUNITY)
Admission: RE | Disposition: A | Discharge status: Home / Self Care | Payer: Self-pay | Source: Ambulatory Visit | Attending: Ophthalmology

## 2012-06-20 DIAGNOSIS — H2510 Age-related nuclear cataract, unspecified eye: Secondary | ICD-10-CM | POA: Insufficient documentation

## 2012-06-20 DIAGNOSIS — Z79899 Other long term (current) drug therapy: Secondary | ICD-10-CM | POA: Insufficient documentation

## 2012-06-20 DIAGNOSIS — Z01812 Encounter for preprocedural laboratory examination: Secondary | ICD-10-CM | POA: Insufficient documentation

## 2012-06-20 DIAGNOSIS — I10 Essential (primary) hypertension: Secondary | ICD-10-CM | POA: Insufficient documentation

## 2012-06-20 HISTORY — PX: CATARACT EXTRACTION W/PHACO: SHX586

## 2012-06-20 SURGERY — PHACOEMULSIFICATION, CATARACT, WITH IOL INSERTION
Anesthesia: Monitor Anesthesia Care | Site: Eye | Laterality: Right | Wound class: Clean

## 2012-06-20 MED ORDER — PHENYLEPHRINE HCL 2.5 % OP SOLN
1.0000 [drp] | OPHTHALMIC | Status: AC
  Administered 2012-06-20 (×3): 1 [drp] via OPHTHALMIC

## 2012-06-20 MED ORDER — LACTATED RINGERS IV SOLN
INTRAVENOUS | Status: DC
  Administered 2012-06-20: 1000 mL via INTRAVENOUS

## 2012-06-20 MED ORDER — MIDAZOLAM HCL 2 MG/2ML IJ SOLN
1.0000 mg | INTRAMUSCULAR | Status: DC | PRN
  Administered 2012-06-20: 2 mg via INTRAVENOUS

## 2012-06-20 MED ORDER — TETRACAINE HCL 0.5 % OP SOLN
1.0000 [drp] | OPHTHALMIC | Status: AC
  Administered 2012-06-20 (×3): 1 [drp] via OPHTHALMIC

## 2012-06-20 MED ORDER — NEOMYCIN-POLYMYXIN-DEXAMETH 0.1 % OP OINT
TOPICAL_OINTMENT | OPHTHALMIC | Status: DC | PRN
  Administered 2012-06-20: 1 via OPHTHALMIC

## 2012-06-20 MED ORDER — LIDOCAINE HCL (PF) 1 % IJ SOLN
INTRAMUSCULAR | Status: DC | PRN
  Administered 2012-06-20: .4 mL

## 2012-06-20 MED ORDER — POVIDONE-IODINE 5 % OP SOLN
OPHTHALMIC | Status: DC | PRN
  Administered 2012-06-20: 1 via OPHTHALMIC

## 2012-06-20 MED ORDER — LIDOCAINE HCL 3.5 % OP GEL
1.0000 "application " | Freq: Once | OPHTHALMIC | Status: AC
  Administered 2012-06-20: 1 via OPHTHALMIC

## 2012-06-20 MED ORDER — LIDOCAINE 3.5 % OP GEL OPTIME - NO CHARGE
OPHTHALMIC | Status: DC | PRN
  Administered 2012-06-20: 1 [drp] via OPHTHALMIC

## 2012-06-20 MED ORDER — EPINEPHRINE HCL 1 MG/ML IJ SOLN
INTRAMUSCULAR | Status: AC
  Filled 2012-06-20: qty 1

## 2012-06-20 MED ORDER — MIDAZOLAM HCL 2 MG/2ML IJ SOLN
INTRAMUSCULAR | Status: AC
  Filled 2012-06-20: qty 2

## 2012-06-20 MED ORDER — EPINEPHRINE HCL 1 MG/ML IJ SOLN
INTRAOCULAR | Status: DC | PRN
  Administered 2012-06-20: 09:00:00

## 2012-06-20 MED ORDER — ONDANSETRON HCL 4 MG/2ML IJ SOLN: 4.0000 mg | Freq: Once | INTRAMUSCULAR | Status: DC | PRN

## 2012-06-20 MED ORDER — BSS IO SOLN
INTRAOCULAR | Status: DC | PRN
  Administered 2012-06-20: 15 mL via INTRAOCULAR

## 2012-06-20 MED ORDER — PROVISC 10 MG/ML IO SOLN
INTRAOCULAR | Status: DC | PRN
  Administered 2012-06-20: 8.5 mg via INTRAOCULAR

## 2012-06-20 MED ORDER — FENTANYL CITRATE 0.05 MG/ML IJ SOLN: 25.0000 ug | INTRAMUSCULAR | Status: DC | PRN

## 2012-06-20 MED ORDER — CYCLOPENTOLATE-PHENYLEPHRINE 0.2-1 % OP SOLN
1.0000 [drp] | OPHTHALMIC | Status: AC
  Administered 2012-06-20 (×3): 1 [drp] via OPHTHALMIC

## 2012-06-20 SURGICAL SUPPLY — 31 items
CAPSULAR TENSION RING-AMO (OPHTHALMIC RELATED) IMPLANT
CLOTH BEACON ORANGE TIMEOUT ST (SAFETY) ×1 IMPLANT
EYE SHIELD UNIVERSAL CLEAR (GAUZE/BANDAGES/DRESSINGS) ×1 IMPLANT
GLOVE BIO SURGEON STRL SZ 6.5 (GLOVE) IMPLANT
GLOVE BIOGEL PI IND STRL 6.5 (GLOVE) IMPLANT
GLOVE BIOGEL PI IND STRL 7.0 (GLOVE) IMPLANT
GLOVE BIOGEL PI IND STRL 7.5 (GLOVE) IMPLANT
GLOVE BIOGEL PI INDICATOR 6.5 (GLOVE)
GLOVE BIOGEL PI INDICATOR 7.0 (GLOVE) ×1
GLOVE BIOGEL PI INDICATOR 7.5 (GLOVE)
GLOVE ECLIPSE 6.5 STRL STRAW (GLOVE) IMPLANT
GLOVE ECLIPSE 7.0 STRL STRAW (GLOVE) IMPLANT
GLOVE ECLIPSE 7.5 STRL STRAW (GLOVE) IMPLANT
GLOVE EXAM NITRILE LRG STRL (GLOVE) IMPLANT
GLOVE EXAM NITRILE MD LF STRL (GLOVE) ×2 IMPLANT
GLOVE SKINSENSE NS SZ6.5 (GLOVE)
GLOVE SKINSENSE NS SZ7.0 (GLOVE)
GLOVE SKINSENSE STRL SZ6.5 (GLOVE) IMPLANT
GLOVE SKINSENSE STRL SZ7.0 (GLOVE) IMPLANT
KIT VITRECTOMY (OPHTHALMIC RELATED) IMPLANT
PAD ARMBOARD 7.5X6 YLW CONV (MISCELLANEOUS) ×2 IMPLANT
PROC W NO LENS (INTRAOCULAR LENS)
PROC W SPEC LENS (INTRAOCULAR LENS)
PROCESS W NO LENS (INTRAOCULAR LENS) IMPLANT
PROCESS W SPEC LENS (INTRAOCULAR LENS) IMPLANT
RING MALYGIN (MISCELLANEOUS) IMPLANT
SIGHTPATH CAT PROC W REG LENS (Ophthalmic Related) ×2 IMPLANT
SYR TB 1ML LL NO SAFETY (SYRINGE) ×1 IMPLANT
TAPE CLOTH SOFT 2X10 (GAUZE/BANDAGES/DRESSINGS) ×1 IMPLANT
VISCOELASTIC ADDITIONAL (OPHTHALMIC RELATED) IMPLANT
WATER STERILE IRR 250ML POUR (IV SOLUTION) ×1 IMPLANT

## 2012-06-20 NOTE — Brief Op Note (Signed)
Pre-Op Dx: Cataract OD Post-Op Dx: Cataract OD Surgeon: Ivie Maese Anesthesia: Topical with MAC Surgery: Cataract Extraction with Intraocular lens Implant OD Implant: Lenstec, Model Softec HD Blood Loss: None Specimen: None Complications: None 

## 2012-06-20 NOTE — H&P (Signed)
I have reviewed the H&P, the patient was re-examined, and I have identified no interval changes in medical condition and plan of care since the history and physical of record  

## 2012-06-20 NOTE — Anesthesia Postprocedure Evaluation (Signed)
  Anesthesia Post-op Note  Patient: [name]  Procedure(s) Performed: Procedure(s) (LRB): CATARACT EXTRACTION PHACO AND INTRAOCULAR LENS PLACEMENT (IOC) (Right)  Patient Location: PACU and Short Stay  Anesthesia Type: MAC  Level of Consciousness: awake, alert  and oriented  Airway and Oxygen Therapy: Patient Spontanous Breathing  Post-op Pain: none  Post-op Assessment: Post-op Vital signs reviewed, Patient's Cardiovascular Status Stable, Respiratory Function Stable, Patent Airway and No signs of Nausea or vomiting  Post-op Vital Signs: Reviewed and stable  Complications: No apparent anesthesia complications

## 2012-06-20 NOTE — Transfer of Care (Signed)
Immediate Anesthesia Transfer of Care Note  Patient: [name]  Procedure(s) Performed: Procedure(s) (LRB): CATARACT EXTRACTION PHACO AND INTRAOCULAR LENS PLACEMENT (IOC) (Right)  Patient Location: PACU and Short Stay  Anesthesia Type: MAC  Level of Consciousness: awake  Airway & Oxygen Therapy: Patient Spontanous Breathing  Post-op Assessment: Report given to PACU RN  Post vital signs: Reviewed  Complications: No apparent anesthesia complications

## 2012-06-20 NOTE — Anesthesia Preprocedure Evaluation (Signed)
Anesthesia Evaluation  Patient identified by MRN, date of birth, ID band Patient awake    Reviewed: Allergy & Precautions, H&P , NPO status , Patient's Chart, lab work & pertinent test results  History of Anesthesia Complications Negative for: history of anesthetic complications  Airway Mallampati: II      Dental  (+) Teeth Intact   Pulmonary shortness of breath and with exertion, pneumonia -, resolved,  breath sounds clear to auscultation        Cardiovascular hypertension, Pt. on medications Rhythm:Regular     Neuro/Psych  Headaches, PSYCHIATRIC DISORDERS Anxiety Depression TIACVA, No Residual Symptoms    GI/Hepatic   Endo/Other  Hypothyroidism   Renal/GU      Musculoskeletal   Abdominal   Peds  Hematology   Anesthesia Other Findings   Reproductive/Obstetrics                           Anesthesia Physical Anesthesia Plan  ASA: III  Anesthesia Plan: MAC   Post-op Pain Management:    Induction: Intravenous  Airway Management Planned: Nasal Cannula  Additional Equipment:   Intra-op Plan:   Post-operative Plan:   Informed Consent: I have reviewed the patients History and Physical, chart, labs and discussed the procedure including the risks, benefits and alternatives for the proposed anesthesia with the patient or authorized representative who has indicated his/her understanding and acceptance.     Plan Discussed with:   Anesthesia Plan Comments:         Anesthesia Quick Evaluation  

## 2012-06-21 ENCOUNTER — Encounter (HOSPITAL_COMMUNITY): Payer: Self-pay | Admitting: Ophthalmology

## 2012-06-21 NOTE — Op Note (Signed)
NAMETSURUKO, MURTHA                ACCOUNT NO.:  [id number]  MEDICAL RECORD NO.:  [id number]  LOCATION:  [facility]                          FACILITY:  [facility]  PHYSICIAN:  Susanne Greenhouse, MD       DATE OF BIRTH:  01/25/1946  DATE OF PROCEDURE:  06/20/2012 DATE OF DISCHARGE:  06/20/2012                              OPERATIVE REPORT   PREOPERATIVE DIAGNOSIS:  Nuclear cataract, right eye, diagnosis code 366.16.  POSTOPERATIVE DIAGNOSIS:  Nuclear cataract, right eye, diagnosis code 366.16.  OPERATION PERFORMED:  Phacoemulsification with posterior chamber intraocular lens implantation, right eye.  SURGEON:  Susanne Greenhouse, MD  ANESTHESIA:  Topical with IV sedation.  OPERATIVE SUMMARY:  In the preoperative area, dilating drops were placed into the right eye.  The patient was then brought into the operating room where she was placed under general anesthesia.  The eye was then prepped and draped.  Beginning with a #75 blade, a paracentesis port was made at the surgeon's 2 o'clock position.  The anterior chamber was then filled with a 1% nonpreserved lidocaine solution with epinephrine.  This was followed by Viscoat to deepen the chamber.  A small fornix-based peritomy was performed superiorly.  Next, a single iris hook was placed through the limbus superiorly.  A 2.4-mm keratome blade was then used to make a clear corneal incision over the iris hook.  A bent cystotome needle and Utrata forceps were used to create a continuous tear capsulotomy.  Hydrodissection was performed using balanced salt solution on a fine cannula.  The lens nucleus was then removed using phacoemulsification in a quadrant cracking technique.  The cortical material was then removed with irrigation and aspiration.  The capsular bag and anterior chamber were refilled with Provisc.  The wound was widened to approximately 3 mm and a posterior chamber intraocular lens was placed into the capsular bag without difficulty using an  [employer] lens injecting system.  A single 10-0 nylon suture was then used to close the incision as well as stromal hydration.  The Provisc was removed from the anterior chamber and capsular bag with irrigation and aspiration.  At this point, the wounds were tested for leak, which were negative.  The anterior chamber remained deep and stable.  The patient tolerated the procedure well.  There were no operative complications, and she awoke from general anesthesia without problem.  No surgical specimens.  Prosthetic device used is a Lenstec posterior chamber lens, model Softec HD, power of 6.0, serial number is [id number].          ______________________________ Susanne Greenhouse, MD     KEH/MEDQ  D:  06/20/2012  T:  06/21/2012  Job:  [id number]

## 2012-07-08 ENCOUNTER — Encounter (HOSPITAL_COMMUNITY): Payer: Self-pay | Admitting: Pharmacy Technician

## 2012-07-09 ENCOUNTER — Encounter (HOSPITAL_COMMUNITY): Payer: Self-pay

## 2012-07-09 ENCOUNTER — Encounter (HOSPITAL_COMMUNITY)
Admission: RE | Admit: 2012-07-09 | Discharge: 2012-07-09 | Payer: BC Managed Care – PPO | Source: Ambulatory Visit | Admitting: Ophthalmology

## 2012-07-10 MED ORDER — TETRACAINE HCL 0.5 % OP SOLN
OPHTHALMIC | Status: AC
  Filled 2012-07-10: qty 2

## 2012-07-10 MED ORDER — LIDOCAINE HCL 3.5 % OP GEL
OPHTHALMIC | Status: AC
  Filled 2012-07-10: qty 5

## 2012-07-10 MED ORDER — LIDOCAINE HCL (PF) 1 % IJ SOLN
INTRAMUSCULAR | Status: AC
  Filled 2012-07-10: qty 2

## 2012-07-10 MED ORDER — PHENYLEPHRINE HCL 2.5 % OP SOLN
OPHTHALMIC | Status: AC
  Filled 2012-07-10: qty 2

## 2012-07-10 MED ORDER — NEOMYCIN-POLYMYXIN-DEXAMETH 3.5-10000-0.1 OP OINT
TOPICAL_OINTMENT | OPHTHALMIC | Status: AC
  Filled 2012-07-10: qty 3.5

## 2012-07-10 MED ORDER — CYCLOPENTOLATE-PHENYLEPHRINE 0.2-1 % OP SOLN: 1.0000 [drp] | OPHTHALMIC | Status: DC

## 2012-07-11 ENCOUNTER — Encounter (HOSPITAL_COMMUNITY): Payer: Self-pay | Admitting: *Deleted

## 2012-07-11 ENCOUNTER — Ambulatory Visit (HOSPITAL_COMMUNITY): Payer: BC Managed Care – PPO | Admitting: Anesthesiology

## 2012-07-11 ENCOUNTER — Encounter (HOSPITAL_COMMUNITY): Payer: Self-pay | Admitting: Anesthesiology

## 2012-07-11 ENCOUNTER — Ambulatory Visit (HOSPITAL_COMMUNITY)
Admission: RE | Admit: 2012-07-11 | Discharge: 2012-07-11 | Disposition: A | Discharge status: Home / Self Care | Payer: BC Managed Care – PPO | Source: Ambulatory Visit | Attending: Ophthalmology | Admitting: Ophthalmology

## 2012-07-11 ENCOUNTER — Encounter (HOSPITAL_COMMUNITY)
Admission: RE | Disposition: A | Discharge status: Home / Self Care | Payer: Self-pay | Source: Ambulatory Visit | Attending: Ophthalmology

## 2012-07-11 DIAGNOSIS — I10 Essential (primary) hypertension: Secondary | ICD-10-CM | POA: Insufficient documentation

## 2012-07-11 DIAGNOSIS — Z79899 Other long term (current) drug therapy: Secondary | ICD-10-CM | POA: Insufficient documentation

## 2012-07-11 DIAGNOSIS — H2510 Age-related nuclear cataract, unspecified eye: Secondary | ICD-10-CM | POA: Insufficient documentation

## 2012-07-11 HISTORY — PX: CATARACT EXTRACTION W/PHACO: SHX586

## 2012-07-11 SURGERY — PHACOEMULSIFICATION, CATARACT, WITH IOL INSERTION
Anesthesia: Monitor Anesthesia Care | Site: Eye | Laterality: Left | Wound class: Clean

## 2012-07-11 MED ORDER — LIDOCAINE 3.5 % OP GEL OPTIME - NO CHARGE
OPHTHALMIC | Status: DC | PRN
  Administered 2012-07-11: 2 [drp] via OPHTHALMIC

## 2012-07-11 MED ORDER — BSS IO SOLN
INTRAOCULAR | Status: DC | PRN
  Administered 2012-07-11: 15 mL via INTRAOCULAR

## 2012-07-11 MED ORDER — POVIDONE-IODINE 5 % OP SOLN
OPHTHALMIC | Status: DC | PRN
  Administered 2012-07-11: 1 via OPHTHALMIC

## 2012-07-11 MED ORDER — LIDOCAINE HCL (PF) 1 % IJ SOLN
INTRAMUSCULAR | Status: DC | PRN
  Administered 2012-07-11: .4 mL

## 2012-07-11 MED ORDER — TETRACAINE HCL 0.5 % OP SOLN
1.0000 [drp] | OPHTHALMIC | Status: AC
  Administered 2012-07-11 (×3): 1 [drp] via OPHTHALMIC

## 2012-07-11 MED ORDER — MIDAZOLAM HCL 2 MG/2ML IJ SOLN
1.0000 mg | INTRAMUSCULAR | Status: DC | PRN
  Administered 2012-07-11: 2 mg via INTRAVENOUS

## 2012-07-11 MED ORDER — LIDOCAINE HCL 3.5 % OP GEL: 1.0000 "application " | Freq: Once | OPHTHALMIC | Status: DC

## 2012-07-11 MED ORDER — NEOMYCIN-POLYMYXIN-DEXAMETH 0.1 % OP OINT
TOPICAL_OINTMENT | OPHTHALMIC | Status: DC | PRN
  Administered 2012-07-11: 1 via OPHTHALMIC

## 2012-07-11 MED ORDER — CYCLOPENTOLATE-PHENYLEPHRINE 0.2-1 % OP SOLN
1.0000 [drp] | OPHTHALMIC | Status: AC
  Administered 2012-07-11 (×3): 1 [drp] via OPHTHALMIC

## 2012-07-11 MED ORDER — EPINEPHRINE HCL 1 MG/ML IJ SOLN
INTRAOCULAR | Status: DC | PRN
  Administered 2012-07-11: 09:00:00

## 2012-07-11 MED ORDER — PROVISC 10 MG/ML IO SOLN
INTRAOCULAR | Status: DC | PRN
  Administered 2012-07-11: 8.5 mg via INTRAOCULAR

## 2012-07-11 MED ORDER — LACTATED RINGERS IV SOLN
INTRAVENOUS | Status: DC
  Administered 2012-07-11: 09:00:00 via INTRAVENOUS

## 2012-07-11 MED ORDER — EPINEPHRINE HCL 1 MG/ML IJ SOLN
INTRAMUSCULAR | Status: AC
  Filled 2012-07-11: qty 1

## 2012-07-11 MED ORDER — PHENYLEPHRINE HCL 2.5 % OP SOLN
1.0000 [drp] | OPHTHALMIC | Status: AC
  Administered 2012-07-11 (×3): 1 [drp] via OPHTHALMIC

## 2012-07-11 MED ORDER — MIDAZOLAM HCL 2 MG/2ML IJ SOLN
INTRAMUSCULAR | Status: AC
  Filled 2012-07-11: qty 2

## 2012-07-11 SURGICAL SUPPLY — 32 items
CAPSULAR TENSION RING-AMO (OPHTHALMIC RELATED) IMPLANT
CLOTH BEACON ORANGE TIMEOUT ST (SAFETY) ×2 IMPLANT
EYE SHIELD UNIVERSAL CLEAR (GAUZE/BANDAGES/DRESSINGS) ×1 IMPLANT
GLOVE BIO SURGEON STRL SZ 6.5 (GLOVE) IMPLANT
GLOVE BIOGEL PI IND STRL 6.5 (GLOVE) IMPLANT
GLOVE BIOGEL PI IND STRL 7.0 (GLOVE) IMPLANT
GLOVE BIOGEL PI IND STRL 7.5 (GLOVE) IMPLANT
GLOVE BIOGEL PI INDICATOR 6.5 (GLOVE) ×1
GLOVE BIOGEL PI INDICATOR 7.0 (GLOVE)
GLOVE BIOGEL PI INDICATOR 7.5 (GLOVE)
GLOVE ECLIPSE 6.5 STRL STRAW (GLOVE) IMPLANT
GLOVE ECLIPSE 7.0 STRL STRAW (GLOVE) IMPLANT
GLOVE ECLIPSE 7.5 STRL STRAW (GLOVE) IMPLANT
GLOVE EXAM NITRILE LRG STRL (GLOVE) IMPLANT
GLOVE EXAM NITRILE MD LF STRL (GLOVE) ×1 IMPLANT
GLOVE SKINSENSE NS SZ6.5 (GLOVE)
GLOVE SKINSENSE NS SZ7.0 (GLOVE)
GLOVE SKINSENSE STRL SZ6.5 (GLOVE) IMPLANT
GLOVE SKINSENSE STRL SZ7.0 (GLOVE) IMPLANT
KIT VITRECTOMY (OPHTHALMIC RELATED) IMPLANT
PAD ARMBOARD 7.5X6 YLW CONV (MISCELLANEOUS) ×1 IMPLANT
PROC W NO LENS (INTRAOCULAR LENS)
PROC W SPEC LENS (INTRAOCULAR LENS)
PROCESS W NO LENS (INTRAOCULAR LENS) IMPLANT
PROCESS W SPEC LENS (INTRAOCULAR LENS) IMPLANT
RING MALYGIN (MISCELLANEOUS) IMPLANT
SIGHTPATH CAT PROC W REG LENS (Ophthalmic Related) ×2 IMPLANT
SYR TB 1ML LL NO SAFETY (SYRINGE) ×1 IMPLANT
TAPE SURG TRANSPORE 1 IN (GAUZE/BANDAGES/DRESSINGS) IMPLANT
TAPE SURGICAL TRANSPORE 1 IN (GAUZE/BANDAGES/DRESSINGS) ×1
VISCOELASTIC ADDITIONAL (OPHTHALMIC RELATED) IMPLANT
WATER STERILE IRR 250ML POUR (IV SOLUTION) ×1 IMPLANT

## 2012-07-11 NOTE — Anesthesia Postprocedure Evaluation (Signed)
  Anesthesia Post-op Note  Patient: [name]  Procedure(s) Performed: Procedure(s) (LRB): CATARACT EXTRACTION PHACO AND INTRAOCULAR LENS PLACEMENT (IOC) (Left)  Patient Location: PACU and Short Stay  Anesthesia Type: MAC  Level of Consciousness: awake, alert , oriented and patient cooperative  Airway and Oxygen Therapy: Patient Spontanous Breathing  Post-op Pain: none  Post-op Assessment: Post-op Vital signs reviewed, Patient's Cardiovascular Status Stable, Respiratory Function Stable, Patent Airway, No signs of Nausea or vomiting and Pain level controlled  Post-op Vital Signs: Reviewed and stable  Complications: No apparent anesthesia complications

## 2012-07-11 NOTE — H&P (Signed)
I have reviewed the H&P, the patient was re-examined, and I have identified no interval changes in medical condition and plan of care since the history and physical of record  

## 2012-07-11 NOTE — Brief Op Note (Signed)
Pre-Op Dx: Cataract OS Post-Op Dx: Cataract OS Surgeon: Yadiel Aubry Anesthesia: Topical with MAC Surgery: Cataract Extraction with Intraocular lens Implant OS Implant: Lenstec, Model Softec HD Specimen: None Complications: None 

## 2012-07-11 NOTE — Anesthesia Preprocedure Evaluation (Signed)
Anesthesia Evaluation  Patient identified by MRN, date of birth, ID band Patient awake    Reviewed: Allergy & Precautions, H&P , NPO status , Patient's Chart, lab work & pertinent test results  History of Anesthesia Complications Negative for: history of anesthetic complications  Airway Mallampati: II      Dental  (+) Teeth Intact   Pulmonary shortness of breath and with exertion, pneumonia -, resolved,  breath sounds clear to auscultation        Cardiovascular hypertension, Pt. on medications Rhythm:Regular     Neuro/Psych  Headaches, PSYCHIATRIC DISORDERS Anxiety Depression TIACVA, No Residual Symptoms    GI/Hepatic   Endo/Other  Hypothyroidism   Renal/GU      Musculoskeletal   Abdominal   Peds  Hematology   Anesthesia Other Findings   Reproductive/Obstetrics                           Anesthesia Physical Anesthesia Plan  ASA: III  Anesthesia Plan: MAC   Post-op Pain Management:    Induction: Intravenous  Airway Management Planned: Nasal Cannula  Additional Equipment:   Intra-op Plan:   Post-operative Plan:   Informed Consent: I have reviewed the patients History and Physical, chart, labs and discussed the procedure including the risks, benefits and alternatives for the proposed anesthesia with the patient or authorized representative who has indicated his/her understanding and acceptance.     Plan Discussed with:   Anesthesia Plan Comments:         Anesthesia Quick Evaluation

## 2012-07-11 NOTE — Transfer of Care (Signed)
Immediate Anesthesia Transfer of Care Note  Patient: [name]  Procedure(s) Performed: Procedure(s) (LRB): CATARACT EXTRACTION PHACO AND INTRAOCULAR LENS PLACEMENT (IOC) (Left)  Patient Location: PACU and Short Stay  Anesthesia Type: MAC  Level of Consciousness: awake, alert , oriented and patient cooperative  Airway & Oxygen Therapy: Patient Spontanous Breathing  Post-op Assessment: Report given to PACU RN, Post -op Vital signs reviewed and stable and Patient moving all extremities  Post vital signs: Reviewed and stable  Complications: No apparent anesthesia complications

## 2012-07-12 NOTE — Op Note (Signed)
[name], [name]                ACCOUNT NO.:  [id number]  MEDICAL RECORD NO.:  [id number]  LOCATION:  [facility]                          FACILITY:  [facility]  PHYSICIAN:  Susanne Greenhouse, MD       DATE OF BIRTH:  [birth year]  DATE OF PROCEDURE:  07/11/2012 DATE OF DISCHARGE:  07/11/2012                              OPERATIVE REPORT   PREOPERATIVE DIAGNOSIS:  Nuclear cataract, left eye, diagnosis code 366.16.  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  OPERATION PERFORMED:  Phacoemulsification with posterior chamber intraocular lens implantation, left eye.  SURGEON:  Susanne Greenhouse, MD.  ANESTHESIA:  Topical with IV sedation.  OPERATIVE SUMMARY:  In the preoperative area, dilating drops were placed into the left eye.  The patient was then brought into the operating room where she was placed under general anesthesia.  The eye was then prepped and draped.  Beginning with a 75 blade, a paracentesis port was made at the surgeon's 2 o'clock position.  The anterior chamber was then filled with a 1% nonpreserved lidocaine solution with epinephrine.  This was followed by Viscoat to deepen the chamber.  A small fornix-based peritomy was performed superiorly.  Next, a single iris hook was placed through the limbus superiorly.  A 2.4-mm keratome blade was then used to make a clear corneal incision over the iris hook.  A bent cystotome needle and Utrata forceps were used to create a continuous tear capsulotomy.  Hydrodissection was performed using balanced salt solution on a fine cannula.  The lens nucleus was then removed using phacoemulsification in a quadrant cracking technique.  The cortical material was then removed with irrigation and aspiration.  The capsular bag and anterior chamber were refilled with Provisc.  The wound was widened to approximately 3 mm and a posterior chamber intraocular lens was placed into the capsular bag without difficulty using an  [employer] lens injecting system.  A single 10-0 nylon suture was then used to close the incision as well as stromal hydration.  The Provisc was removed from the anterior chamber and capsular bag with irrigation and aspiration.  At this point, the wounds were tested for leak, which were negative.  The anterior chamber remained deep and stable.  The patient tolerated the procedure well.  There were no operative complications, and she awoke from general anesthesia without problem.  No surgical specimens.  Prosthetic device used is a Lenstec posterior chamber lens, model Softec HD, power of 9.0, serial number is [id number].          ______________________________ Susanne Greenhouse, MD     KEH/MEDQ  D:  07/11/2012  T:  07/12/2012  Job:  [id number]

## 2012-07-15 ENCOUNTER — Encounter (HOSPITAL_COMMUNITY): Payer: Self-pay | Admitting: Ophthalmology

## 2012-10-02 ENCOUNTER — Encounter (HOSPITAL_COMMUNITY): Payer: Self-pay | Admitting: *Deleted

## 2012-10-02 ENCOUNTER — Emergency Department (HOSPITAL_COMMUNITY): Payer: BC Managed Care – PPO

## 2012-10-02 ENCOUNTER — Other Ambulatory Visit: Payer: Self-pay | Admitting: Family Medicine

## 2012-10-02 ENCOUNTER — Observation Stay (HOSPITAL_COMMUNITY)
Admission: EM | Admit: 2012-10-02 | Discharge: 2012-10-03 | Disposition: A | Discharge status: Home / Self Care | Payer: BC Managed Care – PPO | Attending: Internal Medicine | Admitting: Internal Medicine

## 2012-10-02 DIAGNOSIS — G459 Transient cerebral ischemic attack, unspecified: Secondary | ICD-10-CM

## 2012-10-02 DIAGNOSIS — E872 Acidosis: Secondary | ICD-10-CM

## 2012-10-02 DIAGNOSIS — I129 Hypertensive chronic kidney disease with stage 1 through stage 4 chronic kidney disease, or unspecified chronic kidney disease: Secondary | ICD-10-CM | POA: Insufficient documentation

## 2012-10-02 DIAGNOSIS — F1020 Alcohol dependence, uncomplicated: Secondary | ICD-10-CM | POA: Diagnosis present

## 2012-10-02 DIAGNOSIS — R55 Syncope and collapse: Principal | ICD-10-CM | POA: Insufficient documentation

## 2012-10-02 DIAGNOSIS — E876 Hypokalemia: Secondary | ICD-10-CM | POA: Diagnosis present

## 2012-10-02 DIAGNOSIS — N289 Disorder of kidney and ureter, unspecified: Secondary | ICD-10-CM

## 2012-10-02 DIAGNOSIS — N182 Chronic kidney disease, stage 2 (mild): Secondary | ICD-10-CM | POA: Diagnosis present

## 2012-10-02 DIAGNOSIS — N179 Acute kidney failure, unspecified: Secondary | ICD-10-CM | POA: Diagnosis present

## 2012-10-02 DIAGNOSIS — D649 Anemia, unspecified: Secondary | ICD-10-CM | POA: Diagnosis present

## 2012-10-02 DIAGNOSIS — E538 Deficiency of other specified B group vitamins: Secondary | ICD-10-CM | POA: Diagnosis present

## 2012-10-02 DIAGNOSIS — I951 Orthostatic hypotension: Secondary | ICD-10-CM | POA: Diagnosis present

## 2012-10-02 DIAGNOSIS — E861 Hypovolemia: Secondary | ICD-10-CM | POA: Diagnosis present

## 2012-10-02 DIAGNOSIS — R4020 Unspecified coma: Secondary | ICD-10-CM

## 2012-10-02 DIAGNOSIS — E871 Hypo-osmolality and hyponatremia: Secondary | ICD-10-CM | POA: Diagnosis present

## 2012-10-02 DIAGNOSIS — I6529 Occlusion and stenosis of unspecified carotid artery: Secondary | ICD-10-CM | POA: Diagnosis present

## 2012-10-02 DIAGNOSIS — E039 Hypothyroidism, unspecified: Secondary | ICD-10-CM | POA: Diagnosis present

## 2012-10-02 DIAGNOSIS — D539 Nutritional anemia, unspecified: Secondary | ICD-10-CM | POA: Insufficient documentation

## 2012-10-02 DIAGNOSIS — E785 Hyperlipidemia, unspecified: Secondary | ICD-10-CM

## 2012-10-02 DIAGNOSIS — I10 Essential (primary) hypertension: Secondary | ICD-10-CM

## 2012-10-02 HISTORY — DX: Chronic obstructive pulmonary disease, unspecified: J44.9

## 2012-10-02 HISTORY — DX: Chronic kidney disease, stage 2 (mild): N18.2

## 2012-10-02 HISTORY — DX: Occlusion and stenosis of unspecified carotid artery: I65.29

## 2012-10-02 HISTORY — DX: Orthostatic hypotension: I95.1

## 2012-10-02 HISTORY — DX: Alcohol dependence, uncomplicated: F10.20

## 2012-10-02 HISTORY — DX: Hypo-osmolality and hyponatremia: E87.1

## 2012-10-02 LAB — BASIC METABOLIC PANEL
BUN: 14 mg/dL (ref 6–23)
CO2: 22 mEq/L (ref 19–32)
Calcium: 9.1 mg/dL (ref 8.4–10.5)
Chloride: 87 mEq/L — ABNORMAL LOW (ref 96–112)
Creatinine, Ser: 1.68 mg/dL — ABNORMAL HIGH (ref 0.50–1.10)
GFR calc Af Amer: 36 mL/min — ABNORMAL LOW (ref >90)
GFR calc non Af Amer: 31 mL/min — ABNORMAL LOW (ref >90)
Glucose, Bld: 83 mg/dL (ref 70–99)
Potassium: 3.4 mEq/L — ABNORMAL LOW (ref 3.5–5.1)
Sodium: 125 mEq/L — ABNORMAL LOW (ref 135–145)

## 2012-10-02 LAB — CBC WITH DIFFERENTIAL/PLATELET
Basophils Absolute: 0 10*3/uL (ref 0.0–0.1)
Basophils Relative: 0 % (ref 0–1)
Eosinophils Absolute: 0.1 10*3/uL (ref 0.0–0.7)
Eosinophils Relative: 2 % (ref 0–5)
HCT: 31.6 % — ABNORMAL LOW (ref 36.0–46.0)
Hemoglobin: 11.3 g/dL — ABNORMAL LOW (ref 12.0–15.0)
Lymphocytes Relative: 23 % (ref 12–46)
Lymphs Abs: 1.8 10*3/uL (ref 0.7–4.0)
MCH: 36.1 pg — ABNORMAL HIGH (ref 26.0–34.0)
MCHC: 35.8 g/dL (ref 30.0–36.0)
MCV: 101 fL — ABNORMAL HIGH (ref 78.0–100.0)
Monocytes Absolute: 0.7 10*3/uL (ref 0.1–1.0)
Monocytes Relative: 9 % (ref 3–12)
Neutro Abs: 5.4 10*3/uL (ref 1.7–7.7)
Neutrophils Relative %: 67 % (ref 43–77)
Platelets: 246 10*3/uL (ref 150–400)
RBC: 3.13 MIL/uL — ABNORMAL LOW (ref 3.87–5.11)
RDW: 13.4 % (ref 11.5–15.5)
WBC: 8.1 10*3/uL (ref 4.0–10.5)

## 2012-10-02 LAB — TROPONIN I: Troponin I: <0.3 ng/mL (ref <0.30)

## 2012-10-02 LAB — URINALYSIS, ROUTINE W REFLEX MICROSCOPIC
Bilirubin Urine: NEGATIVE
Glucose, UA: NEGATIVE mg/dL
Hgb urine dipstick: NEGATIVE
Ketones, ur: NEGATIVE mg/dL
Nitrite: NEGATIVE
Protein, ur: NEGATIVE mg/dL
Specific Gravity, Urine: 1.01 (ref 1.005–1.030)
Urobilinogen, UA: 0.2 mg/dL (ref 0.0–1.0)
pH: 6 (ref 5.0–8.0)

## 2012-10-02 LAB — URINE MICROSCOPIC-ADD ON

## 2012-10-02 LAB — T4, FREE: Free T4: 1.81 ng/dL — ABNORMAL HIGH (ref 0.80–1.80)

## 2012-10-02 LAB — TSH: TSH: 4.908 u[IU]/mL — ABNORMAL HIGH (ref 0.350–4.500)

## 2012-10-02 LAB — MAGNESIUM: Magnesium: 1.6 mg/dL (ref 1.5–2.5)

## 2012-10-02 LAB — PHOSPHORUS: Phosphorus: 2.6 mg/dL (ref 2.3–4.6)

## 2012-10-02 LAB — D-DIMER, QUANTITATIVE: D-Dimer, Quant: 2.31 ug/mL-FEU — ABNORMAL HIGH (ref 0.00–0.48)

## 2012-10-02 MED ORDER — LEVOTHYROXINE SODIUM 25 MCG PO TABS
12.5000 ug | ORAL_TABLET | Freq: Every day | ORAL | Status: DC
  Administered 2012-10-03: 12.5 ug via ORAL
  Filled 2012-10-02: qty 1

## 2012-10-02 MED ORDER — OXYCODONE HCL 5 MG PO TABS: 5.0000 mg | ORAL_TABLET | ORAL | Status: DC | PRN

## 2012-10-02 MED ORDER — ADULT MULTIVITAMIN W/MINERALS CH
1.0000 | ORAL_TABLET | Freq: Every day | ORAL | Status: DC
  Administered 2012-10-03: 1 via ORAL
  Filled 2012-10-02: qty 1

## 2012-10-02 MED ORDER — LORAZEPAM 1 MG PO TABS
1.0000 mg | ORAL_TABLET | Freq: Four times a day (QID) | ORAL | Status: DC | PRN
  Administered 2012-10-02: 0.5 mg via ORAL
  Filled 2012-10-02: qty 1

## 2012-10-02 MED ORDER — CALCIUM CARBONATE ANTACID 500 MG PO CHEW
1.0000 | CHEWABLE_TABLET | Freq: Three times a day (TID) | ORAL | Status: DC
  Administered 2012-10-03: 200 mg via ORAL
  Filled 2012-10-02: qty 1

## 2012-10-02 MED ORDER — BUDESONIDE-FORMOTEROL FUMARATE 160-4.5 MCG/ACT IN AERO
2.0000 | INHALATION_SPRAY | Freq: Two times a day (BID) | RESPIRATORY_TRACT | Status: DC
  Filled 2012-10-02: qty 6

## 2012-10-02 MED ORDER — ACETAMINOPHEN 325 MG PO TABS: 650.0000 mg | ORAL_TABLET | Freq: Four times a day (QID) | ORAL | Status: DC | PRN

## 2012-10-02 MED ORDER — BUDESONIDE-FORMOTEROL FUMARATE 160-4.5 MCG/ACT IN AERO
INHALATION_SPRAY | RESPIRATORY_TRACT | Status: AC
  Filled 2012-10-02: qty 6

## 2012-10-02 MED ORDER — FOLIC ACID 1 MG PO TABS
1.0000 mg | ORAL_TABLET | Freq: Every day | ORAL | Status: DC
  Administered 2012-10-03: 1 mg via ORAL
  Filled 2012-10-02: qty 1

## 2012-10-02 MED ORDER — ONDANSETRON HCL 4 MG/2ML IJ SOLN: 4.0000 mg | Freq: Four times a day (QID) | INTRAMUSCULAR | Status: DC | PRN

## 2012-10-02 MED ORDER — POTASSIUM CHLORIDE IN NACL 20-0.9 MEQ/L-% IV SOLN: INTRAVENOUS | Status: DC

## 2012-10-02 MED ORDER — ONDANSETRON HCL 4 MG PO TABS: 4.0000 mg | ORAL_TABLET | Freq: Four times a day (QID) | ORAL | Status: DC | PRN

## 2012-10-02 MED ORDER — SODIUM CHLORIDE 0.9 % IV SOLN: INTRAVENOUS | Status: DC

## 2012-10-02 MED ORDER — POTASSIUM CHLORIDE CRYS ER 20 MEQ PO TBCR
30.0000 meq | EXTENDED_RELEASE_TABLET | Freq: Two times a day (BID) | ORAL | Status: AC
  Administered 2012-10-02 – 2012-10-03 (×2): 30 meq via ORAL
  Filled 2012-10-02 (×2): qty 1

## 2012-10-02 MED ORDER — LEVOTHYROXINE SODIUM 100 MCG PO TABS
100.0000 ug | ORAL_TABLET | Freq: Every day | ORAL | Status: DC
Start: 2012-10-03 — End: 2012-10-03
  Administered 2012-10-03: 100 ug via ORAL
  Filled 2012-10-02: qty 1

## 2012-10-02 MED ORDER — VITAMIN B-1 100 MG PO TABS
100.0000 mg | ORAL_TABLET | Freq: Every day | ORAL | Status: DC
  Administered 2012-10-03: 100 mg via ORAL
  Filled 2012-10-02: qty 1

## 2012-10-02 MED ORDER — TECHNETIUM TO 99M ALBUMIN AGGREGATED
3.0000 | Freq: Once | INTRAVENOUS | Status: AC | PRN
  Administered 2012-10-02: 2.9 via INTRAVENOUS

## 2012-10-02 MED ORDER — SODIUM CHLORIDE 0.9 % IV BOLUS (SEPSIS): 500.0000 mL | Freq: Once | INTRAVENOUS | Status: DC

## 2012-10-02 MED ORDER — ALUM & MAG HYDROXIDE-SIMETH 200-200-20 MG/5ML PO SUSP: 30.0000 mL | Freq: Four times a day (QID) | ORAL | Status: DC | PRN

## 2012-10-02 MED ORDER — EST ESTROGENS-METHYLTEST 0.625-1.25 MG PO TABS: 1.0000 | ORAL_TABLET | Freq: Every morning | ORAL | Status: DC

## 2012-10-02 MED ORDER — LORAZEPAM 2 MG/ML IJ SOLN: 1.0000 mg | Freq: Four times a day (QID) | INTRAMUSCULAR | Status: DC | PRN

## 2012-10-02 MED ORDER — ACETAMINOPHEN 650 MG RE SUPP: 650.0000 mg | Freq: Four times a day (QID) | RECTAL | Status: DC | PRN

## 2012-10-02 MED ORDER — THIAMINE HCL 100 MG/ML IJ SOLN: 100.0000 mg | Freq: Every day | INTRAMUSCULAR | Status: DC

## 2012-10-02 MED ORDER — PANTOPRAZOLE SODIUM 40 MG PO TBEC
40.0000 mg | DELAYED_RELEASE_TABLET | Freq: Every day | ORAL | Status: DC
  Administered 2012-10-03: 40 mg via ORAL
  Filled 2012-10-02: qty 1

## 2012-10-02 MED ORDER — ALBUTEROL SULFATE HFA 108 (90 BASE) MCG/ACT IN AERS: 2.0000 | INHALATION_SPRAY | Freq: Four times a day (QID) | RESPIRATORY_TRACT | Status: DC | PRN

## 2012-10-02 NOTE — ED Notes (Signed)
Pt went to Dr [facility] office for exam related to  Kidney function. While there" ?lost consciousness" while standing . Has recently had bp  Med change.  Sent to [facility] for ct scan.  But fell when getting out of car.  Alert, talking,

## 2012-10-02 NOTE — ED Notes (Signed)
Tried to call report, was told that pt was given wrong room per bed control.

## 2012-10-02 NOTE — H&P (Signed)
[facility] History and Physical  [name] WUJ:[id number] DOB: December 24, 1945 DOA: 10/02/2012  Referring physician: Dr. Clarene Duke PCP: Rudi Heap, MD  Specialists:   Chief Complaint: Passed out.  HPI: [name] is a 66 y.o. female with a history significant for hypertension, depression, and alcoholism, who presents to the emergency department today with 2 episodes of presyncope/syncope. The history is being provided by the patient and the patient's son. Accordingly, the patient went to her primary care physician's office today for routine followup. Apparently, she was standing in the waiting room and then became glassy eyed and unresponsive. She never fell. It appeared that she lost consciousness for a few seconds before becoming aware of her surroundings. Her son was called. He brought her to the emergency department. As she was getting out of the car, she became briefly unresponsive when she stood up and fell on the ground. There was no head trauma. After approximately 20-30 seconds, she regained consciousness. Of note, her eyes were never closed, but she was just staring off in space unresponsive for that time period. There was no reported shaking or tonic-clonic activity.  After 20-30 seconds, she became aware of her surroundings. She had no recent nausea, vomiting, or diarrhea. She denies headache. She denies chest pain or shortness of breath. She has chronic bodyaches. She has depression, but denies suicidal ideation. She had recently restarted Cymbalta one week ago. Since restarting it for depression and bodyaches, she has had several dizzy spells described as lightheadedness. She also had a recent change in her blood pressure medications. She had been taking valsartan-HCTZ and metoprolol during the day and HCTZ at bedtime. Last week, the bedtime HCTZ was discontinued. Also, she drinks 2-3 alcoholic beverages with vodka daily. She has not drunk any alcohol so far today.  In the  emergency department, she is noted to be afebrile and hemodynamically stable. However, her systolic blood pressure fell to 91 when standing. Her lab data are significant for a hemoglobin of 11.3, sodium of 125, potassium 3.4, and creatinine of 1.68. CT scan of her head reveals no acute intracranial findings. A d-dimer was ordered by the ED physician. It was elevated. Subsequently, a VQ scan was ordered. It revealed low probability for pulmonary embolism. She is being admitted for further evaluation and management.   Review of Systems: positive for chronic joint and bodyaches and depression, but no suicidal ideation. Otherwise review of systems is negative.   Past Medical History  Diagnosis Date  . Hypertension   . Menopause   . Hypothyroid   . Mini stroke   . Hyperlipidemia   . B12 deficiency   . Shortness of breath   . Headache   . Anxiety   . Pneumonia   . Anemia   . Depression   . Stroke 2010    no deficits  . Alcoholism 01/09/2012  . Hyponatremia 01/09/2012  . Chronic kidney disease (CKD), stage II (mild)   . COPD (chronic obstructive pulmonary disease)   . Hypothyroidism 10/02/2012   Past Surgical History  Procedure Date  . Appendectomy   . Tonsillectomy   . Cataract extraction w/phaco 06/20/2012    Procedure: CATARACT EXTRACTION PHACO AND INTRAOCULAR LENS PLACEMENT (IOC);  Surgeon: Gemma Payor, MD;  Location: [facility];  Service: Ophthalmology;  Laterality: Right;  CDE:10.66  . Cataract extraction w/phaco 07/11/2012    Procedure: CATARACT EXTRACTION PHACO AND INTRAOCULAR LENS PLACEMENT (IOC);  Surgeon: Gemma Payor, MD;  Location: [facility];  Service: Ophthalmology;  Laterality: Left;  CDE: 12.69   Social History:  She is widowed. She has 2 children. She is employed as a Facilities manager. She stopped smoking 20 years ago after smoking 2 packs of cigarettes for approximately 30 years. She denies illicit drug use. She drinks 2-3 alcoholic beverages with vodka daily. She  admits to withdrawal symptoms when she doesn't drink every day, namely tremulousness.   Allergies  Allergen Reactions  . Norvasc (Amlodipine Besylate) Hives  . Procardia (Nifedipine) Swelling    Edema      Family history: Her mother died of a surgical infection. Her father died of viral pneumonia.    Prior to Admission medications   Medication Sig Start Date End Date Taking? Authorizing Provider  acetaminophen (TYLENOL) 500 MG tablet Take 500 mg by mouth every 6 (six) hours as needed. Pain   Yes Historical Provider, MD  albuterol (PROVENTIL HFA;VENTOLIN HFA) 108 (90 BASE) MCG/ACT inhaler Inhale 2 puffs into the lungs every 6 (six) hours as needed. Shortness of Breath   Yes Historical Provider, MD  budesonide-formoterol (SYMBICORT) 160-4.5 MCG/ACT inhaler Inhale 2 puffs into the lungs 2 (two) times daily.   Yes Historical Provider, MD  calcium carbonate (TUMS - DOSED IN MG ELEMENTAL CALCIUM) 500 MG chewable tablet Chew 1 tablet (200 mg of elemental calcium total) by mouth 3 (three) times daily with meals. 01/17/12 01/16/13 Yes Altha Harm, MD  cyanocobalamin (,VITAMIN B-12,) 1000 MCG/ML injection Inject 1,000 mcg into the muscle every 30 (thirty) days.   Yes Historical Provider, MD  estrogen-methylTESTOSTERone (ESTRATEST HS) 0.625-1.25 MG per tablet Take 1 tablet by mouth every morning.    Yes Historical Provider, MD  levothyroxine (SYNTHROID, LEVOTHROID) 100 MCG tablet Take 100 mcg by mouth daily before breakfast. Take with 1/2 tablet of the 25 mcg for a total dose of 112.5 mcg.   Yes Historical Provider, MD  levothyroxine (SYNTHROID, LEVOTHROID) 25 MCG tablet Take 25 mcg by mouth daily before breakfast. Take with 1 tablet of the 100 mcg daily for a total dose of 112.5 mcg   Yes Historical Provider, MD  metoprolol tartrate (LOPRESSOR) 25 MG tablet Take 1 tablet (25 mg total) by mouth 2 (two) times daily. 01/17/12 01/16/13 Yes Altha Harm, MD  valsartan-hydrochlorothiazide  (DIOVAN-HCT) 160-25 MG per tablet Take 1 tablet by mouth daily.   Yes Historical Provider, MD   Physical Exam: Filed Vitals:   10/02/12 1225 10/02/12 1226 10/02/12 1558 10/02/12 1733  BP: 108/56 91/57 149/70 160/74  Pulse:   77 80  Temp:      TempSrc:      Resp:   18   Height:      Weight:      SpO2:   96%      General:  Pleasant alert 66 year old Caucasian woman sitting up in bed, in no acute distress.  Eyes: Pupils are equal, round, and reactive to light. Extraocular movement are intact. Conjunctivae are clear. Sclerae are white.  ENT: Tympanic membranes are clear bilaterally. Nasal mucosa is dry. Oropharynx reveals mildly dry mucous membranes.   Neck: Supple, no adenopathy, no thyromegaly, no bruit.   Cardiovascular: S1, S2, with no murmurs rubs or gallops.   Respiratory: Clear to auscultation bilaterally.   Abdomen:  Mildly obese, positive bowel sounds, soft, nontender, nondistended.  Skin: Good turgor. No rashes.   Musculoskeletal: No acute hot red joints. Pedal pulses are palpable. No pedal edema.   Psychiatric: She is alert and oriented x3. She has a flat affect. Her speech  is clear. No tremulousness.   Neurologic: Cranial nerves II through XII are intact. Strength is globally 5 over 5. Sensation is globally intact. No nystagmus. Cerebellar with finger to nose intact bilaterally.   Labs on Admission:  Basic Metabolic Panel:  Lab 10/02/12 1610  NA 125*  K 3.4*  CL 87*  CO2 22  GLUCOSE 83  BUN 14  CREATININE 1.68*  CALCIUM 9.1  MG --  PHOS --   Liver Function Tests: No results found for this basename: AST:5,ALT:5,ALKPHOS:5,BILITOT:5,PROT:5,ALBUMIN:5 in the last 168 hours No results found for this basename: LIPASE:5,AMYLASE:5 in the last 168 hours No results found for this basename: AMMONIA:5 in the last 168 hours CBC:  Lab 10/02/12 1311  WBC 8.1  NEUTROABS 5.4  HGB 11.3*  HCT 31.6*  MCV 101.0*  PLT 246   Cardiac Enzymes:  Lab 10/02/12 1311    CKTOTAL --  CKMB --  CKMBINDEX --  TROPONINI <0.30    BNP (last 3 results) No results found for this basename: PROBNP:3 in the last 8760 hours CBG: No results found for this basename: GLUCAP:5 in the last 168 hours  Radiological Exams on Admission: Dg Chest 2 View  10/02/2012  *RADIOLOGY REPORT*  Clinical Data: Syncope  CHEST - 2 VIEW  Comparison: 01/09/2012  Findings: Apical pleural and  parenchymal scarring bilaterally is unchanged.  Negative for heart failure or pneumonia.  No mass lesion.  IMPRESSION: No change from the prior study.  Apical scarring bilaterally.  No acute abnormality.   Original Report Authenticated By: Camelia Phenes, M.D.    Ct Head Wo Contrast  10/02/2012  *RADIOLOGY REPORT*  Clinical Data: Loss of consciousness, fell in parking lot, history hypertension  CT HEAD WITHOUT CONTRAST  Technique:  Contiguous axial images were obtained from the base of the skull through the vertex without contrast.  Comparison: None  Findings: Generalized atrophy. Normal ventricular morphology. No midline shift or mass effect. Small vessel chronic ischemic changes of deep cerebral white matter. No intracranial hemorrhage, mass lesion, or acute infarction. Visualized paranasal sinuses and mastoid air cells clear. Bones unremarkable.  IMPRESSION: No acute intracranial abnormalities.   Original Report Authenticated By: Lollie Marrow, M.D.    Nm Pulmonary Perfusion  10/02/2012  *RADIOLOGY REPORT*  Clinical Data: Syncope, hypotension, elevated D-dimer; impaired renal function  NM PULMONARY PERFUSION PARTICULATE  Radiopharmaceutical: 2.9MILLI CURIE MAA TECHNETIUM TO 50M ALBUMIN AGGREGATED  Comparison: Chest radiograph 10/02/2012  Findings: Single tiny subsegmental perfusion defect identified at lateral right mid lung, corresponding to a focus of pleural thickening/scarring on chest radiograph at the lateral margin of the minor fissure. Remaining perfusion is normal. No additional pulmonary  perfusion defects identified. Ventilation exam not performed.  IMPRESSION: Very low probability for pulmonary embolism.   Original Report Authenticated By: Lollie Marrow, M.D.     EKG: normal sinus rhythm with a heart rate of 78 beats per minute. No ST or T wave abnormalities.    Assessment/Plan Active Problems:  Acute renal failure  Hypovolemia dehydration  Hyponatremia  Anemia  Alcoholism  B12 deficiency  Orthostatic syncope  Hypokalemia  Hypothyroidism   This is a 66 year old woman who presents with transient unresponsiveness consistent with syncope. The syncope appears to be orthostatic in nature. However, seizure disorder is also a consideration. She appears to be dehydrated. Her hyponatremia is likely hypovolemic hyponatremia. Her bedtime dose of hydrochlorothiazide was recently discontinued. She was recently restarted on Cymbalta. Both of these medications may have played a part in her symptomatology.  Her alcohol abuse may also be playing a role. She is also noted to have acute renal insufficiency/failure. Her creatinine in July of 2013 was 1.5. She has a history of significant acute renal failure in January 2013 when she had a creatinine of greater than 18. Currently, she is neurologically intact. Because of her alcoholism, she will need to be started on the Ativan alcohol withdrawal protocol.     Plan:   1. Will start IV hydration with normal saline with potassium chloride added. 2. Will supplement/replete her potassium with oral potassium chloride. We'll check a magnesium level to assess for deficiency. 3. We'll start the Ativan alcohol withdrawal protocol with Ativan and vitamin therapy. 4. We'll hold her antihypertensive medications and Cymbalta. 5. We'll start emperic Protonix in the setting of alcoholism and mild anemia.. 6. For further evaluation, we'll order carotid ultrasound, EEG, TSH, etc. we'll recheck orthostatic vital signs in the morning. We'll also consult  neurologist Dr. Gerilyn Pilgrim. 7. We'll consult the clinical social worker to assist with counseling on alcohol abstinence and referral to appropriate outpatient services.    Code Status: Full code Family Communication: Discussed with son. Disposition Plan: Anticipate discharge in 24-48 hours.  Time spent: One hour.  Mattison Stuckey [facility]   If 7PM-7AM, please contact night-coverage [email or web address] Password [facility] 10/02/2012, 5:42 PM

## 2012-10-02 NOTE — ED Provider Notes (Signed)
History     CSN: [id number]  Arrival date & time 10/02/12  1142   First MD Initiated Contact with Patient 10/02/12 1246      Chief Complaint  Patient presents with  . Fall  . Loss of Consciousness     HPI Pt was seen at 1250.  Per pt, c/o sudden onset and resolution of 2 brief episodes of syncope that occurred PTA.  Pt states the first episode occurred while she was standing at her PMD's office this morning; the second when she came to the hospital to get a CT scan.  First episode was described as "staring off," and being unresponsive, but pt did not fall.  Family states she "fell when she got out of the car" during the second syncopal episode.  Pt does not recall events.  Denies prodromal symptoms before syncope, no reported seizure activity, no incont of bowel/bladder, no AMS upon awakening.  Denies CP/palpitations, no SOB/cough, no abd pain, no back pain, no visual changes, no slurred speech, no focal motor weakness, no tingling/numbness in extremities.     Past Medical History  Diagnosis Date  . Hypertension   . Menopause   . Hypothyroid   . Mini stroke   . Hyperlipidemia   . B12 deficiency   . Shortness of breath   . Headache   . Anxiety   . Pneumonia   . Anemia   . Depression   . Stroke 2010    no deficits    Past Surgical History  Procedure Date  . Appendectomy   . Tonsillectomy   . Cataract extraction w/phaco 06/20/2012    Procedure: CATARACT EXTRACTION PHACO AND INTRAOCULAR LENS PLACEMENT (IOC);  Surgeon: Gemma Payor, MD;  Location: [facility];  Service: Ophthalmology;  Laterality: Right;  CDE:10.66  . Cataract extraction w/phaco 07/11/2012    Procedure: CATARACT EXTRACTION PHACO AND INTRAOCULAR LENS PLACEMENT (IOC);  Surgeon: Gemma Payor, MD;  Location: [facility];  Service: Ophthalmology;  Laterality: Left;  CDE: 12.69     History  Substance Use Topics  . Smoking status: Former Smoker -- 2.0 packs/day for 30 years    Types: Cigarettes    Quit date: 06/17/1992  .  Smokeless tobacco: Not on file  . Alcohol Use: 14.0 oz/week    28 drink(s) per week    Review of Systems ROS: Statement: All systems negative except as marked or noted in the HPI; Constitutional: Negative for fever and chills. ; ; Eyes: Negative for eye pain, redness and discharge. ; ; ENMT: Negative for ear pain, hoarseness, nasal congestion, sinus pressure and sore throat. ; ; Cardiovascular: Negative for chest pain, palpitations, diaphoresis, dyspnea and peripheral edema. ; ; Respiratory: Negative for cough, wheezing and stridor. ; ; Gastrointestinal: Negative for nausea, vomiting, diarrhea, abdominal pain, blood in stool, hematemesis, jaundice and rectal bleeding. . ; ; Genitourinary: Negative for dysuria, flank pain and hematuria. ; ; Musculoskeletal: Negative for back pain and neck pain. Negative for swelling and trauma.; ; Skin: Negative for pruritus, rash, abrasions, blisters, bruising and skin lesion.; ; Neuro: Negative for headache, lightheadedness and neck stiffness. Negative for weakness, altered level of consciousness , altered mental status, extremity weakness, paresthesias, involuntary movement, seizure and +syncope.       Allergies  Norvasc and Procardia  Home Medications   Current Outpatient Rx  Name Route Sig Dispense Refill  . ACETAMINOPHEN 500 MG PO TABS Oral Take 500 mg by mouth every 6 (six) hours as needed. Pain    .  ALBUTEROL SULFATE HFA 108 (90 BASE) MCG/ACT IN AERS Inhalation Inhale 2 puffs into the lungs every 6 (six) hours as needed. Shortness of Breath    . BUDESONIDE-FORMOTEROL FUMARATE 160-4.5 MCG/ACT IN AERO Inhalation Inhale 2 puffs into the lungs 2 (two) times daily.    Marland Kitchen CALCIUM CARBONATE ANTACID 500 MG PO CHEW Oral Chew 1 tablet (200 mg of elemental calcium total) by mouth 3 (three) times daily with meals.    . CYANOCOBALAMIN 1000 MCG/ML IJ SOLN Intramuscular Inject 1,000 mcg into the muscle every 30 (thirty) days.    Marland Kitchen EST ESTROGENS-METHYLTEST 0.625-1.25 MG  PO TABS Oral Take 1 tablet by mouth every morning.     Marland Kitchen LEVOTHYROXINE SODIUM 100 MCG PO TABS Oral Take 100 mcg by mouth daily before breakfast. Take with 1/2 tablet of the 25 mcg for a total dose of 112.5 mcg.    Marland Kitchen LEVOTHYROXINE SODIUM 25 MCG PO TABS Oral Take 25 mcg by mouth daily before breakfast. Take with 1 tablet of the 100 mcg daily for a total dose of 112.5 mcg    . METOPROLOL TARTRATE 25 MG PO TABS Oral Take 1 tablet (25 mg total) by mouth 2 (two) times daily. 60 tablet 0    BP 91/57  Pulse 84  Temp 98.3 F (36.8 C) (Oral)  Resp 20  Ht 5\' 9"  (1.753 m)  Wt 165 lb (74.844 kg)  BMI 24.37 kg/m2  SpO2 100%  Physical Exam 1255: Physical examination:  Nursing notes reviewed; Vital signs and O2 SAT reviewed;  Constitutional: Well developed, Well nourished, Well hydrated, In no acute distress; Head:  Normocephalic, atraumatic; Eyes: EOMI, PERRL, No scleral icterus; ENMT: Mouth and pharynx normal, Mucous membranes moist; Neck: Supple, Full range of motion, No lymphadenopathy; Cardiovascular: Regular rate and rhythm, No gallop; Respiratory: Breath sounds clear & equal bilaterally, No rales, rhonchi, wheezes.  Speaking full sentences with ease, Normal respiratory effort/excursion; Chest: Nontender, Movement normal; Abdomen: Soft, Nontender, Nondistended, Normal bowel sounds;; Extremities: Pulses normal, No tenderness, No edema, No calf edema or asymmetry.; Neuro: AA&Ox3, Major CN grossly intact.  Speech clear. No facial droop. Grips equal, strength 5/5 equal bilat UE's and LE's. Normal coordination.  No gross focal motor or sensory deficits in extremities.; Skin: Color normal, Warm, Dry.   ED Course  Procedures    MDM  MDM Reviewed: nursing note, vitals and previous chart Reviewed previous: labs and ECG Interpretation: labs, ECG, x-ray and CT scan    Date: 10/02/2012  Rate: 78  Rhythm: normal sinus rhythm  QRS Axis: normal  Intervals: normal  ST/T Wave abnormalities: normal   Conduction Disutrbances:none  Narrative Interpretation:   Old EKG Reviewed: unchanged; no significant changes from previous EKG dated 01/09/2012.  Results for orders placed during the hospital encounter of 10/02/12  BASIC METABOLIC PANEL      Component Value Range   Sodium 125 (*) 135 - 145 mEq/L   Potassium 3.4 (*) 3.5 - 5.1 mEq/L   Chloride 87 (*) 96 - 112 mEq/L   CO2 22  19 - 32 mEq/L   Glucose, Bld 83  70 - 99 mg/dL   BUN 14  6 - 23 mg/dL   Creatinine, Ser 1.61 (*) 0.50 - 1.10 mg/dL   Calcium 9.1  8.4 - 09.6 mg/dL   GFR calc non Af Amer 31 (*) >90 mL/min   GFR calc Af Amer 36 (*) >90 mL/min  CBC WITH DIFFERENTIAL      Component Value Range   WBC 8.1  4.0 - 10.5 K/uL   RBC 3.13 (*) 3.87 - 5.11 MIL/uL   Hemoglobin 11.3 (*) 12.0 - 15.0 g/dL   HCT 40.9 (*) 81.1 - 91.4 %   MCV 101.0 (*) 78.0 - 100.0 fL   MCH 36.1 (*) 26.0 - 34.0 pg   MCHC 35.8  30.0 - 36.0 g/dL   RDW 78.2  95.6 - 21.3 %   Platelets 246  150 - 400 K/uL   Neutrophils Relative 67  43 - 77 %   Neutro Abs 5.4  1.7 - 7.7 K/uL   Lymphocytes Relative 23  12 - 46 %   Lymphs Abs 1.8  0.7 - 4.0 K/uL   Monocytes Relative 9  3 - 12 %   Monocytes Absolute 0.7  0.1 - 1.0 K/uL   Eosinophils Relative 2  0 - 5 %   Eosinophils Absolute 0.1  0.0 - 0.7 K/uL   Basophils Relative 0  0 - 1 %   Basophils Absolute 0.0  0.0 - 0.1 K/uL  TROPONIN I      Component Value Range   Troponin I <0.30  <0.30 ng/mL  D-DIMER, QUANTITATIVE      Component Value Range   D-Dimer, Quant 2.31 (*) 0.00 - 0.48 ug/mL-FEU  URINALYSIS, ROUTINE W REFLEX MICROSCOPIC      Component Value Range   Color, Urine YELLOW  YELLOW   APPearance CLEAR  CLEAR   Specific Gravity, Urine 1.010  1.005 - 1.030   pH 6.0  5.0 - 8.0   Glucose, UA NEGATIVE  NEGATIVE mg/dL   Hgb urine dipstick NEGATIVE  NEGATIVE   Bilirubin Urine NEGATIVE  NEGATIVE   Ketones, ur NEGATIVE  NEGATIVE mg/dL   Protein, ur NEGATIVE  NEGATIVE mg/dL   Urobilinogen, UA 0.2  0.0 - 1.0 mg/dL    Nitrite NEGATIVE  NEGATIVE   Leukocytes, UA TRACE (*) NEGATIVE  URINE MICROSCOPIC-ADD ON      Component Value Range   Squamous Epithelial / LPF FEW (*) RARE   WBC, UA 3-6  <3 WBC/hpf   RBC / HPF 0-2  <3 RBC/hpf   Bacteria, UA FEW (*) RARE   Dg Chest 2 View 10/02/2012  *RADIOLOGY REPORT*  Clinical Data: Syncope  CHEST - 2 VIEW  Comparison: 01/09/2012  Findings: Apical pleural and  parenchymal scarring bilaterally is unchanged.  Negative for heart failure or pneumonia.  No mass lesion.  IMPRESSION: No change from the prior study.  Apical scarring bilaterally.  No acute abnormality.   Original Report Authenticated By: Camelia Phenes, M.D.    Ct Head Wo Contrast 10/02/2012  *RADIOLOGY REPORT*  Clinical Data: Loss of consciousness, fell in parking lot, history hypertension  CT HEAD WITHOUT CONTRAST  Technique:  Contiguous axial images were obtained from the base of the skull through the vertex without contrast.  Comparison: None  Findings: Generalized atrophy. Normal ventricular morphology. No midline shift or mass effect. Small vessel chronic ischemic changes of deep cerebral white matter. No intracranial hemorrhage, mass lesion, or acute infarction. Visualized paranasal sinuses and mastoid air cells clear. Bones unremarkable.  IMPRESSION: No acute intracranial abnormalities.   Original Report Authenticated By: Lollie Marrow, M.D.    Nm Pulmonary Perfusion 10/02/2012  *RADIOLOGY REPORT*  Clinical Data: Syncope, hypotension, elevated D-dimer; impaired renal function  NM PULMONARY PERFUSION PARTICULATE  Radiopharmaceutical: 2.9MILLI CURIE MAA TECHNETIUM TO 62M ALBUMIN AGGREGATED  Comparison: Chest radiograph 10/02/2012  Findings: Single tiny subsegmental perfusion defect identified at lateral right mid lung, corresponding  to a focus of pleural thickening/scarring on chest radiograph at the lateral margin of the minor fissure. Remaining perfusion is normal. No additional pulmonary perfusion defects  identified. Ventilation exam not performed.  IMPRESSION: Very low probability for pulmonary embolism.   Original Report Authenticated By: Lollie Marrow, M.D.    Results for [name], [name] (MRN [id number]) as of 10/02/2012 16:40  Ref. Range 01/15/2012 08:00 01/16/2012 06:09 01/17/2012 05:35 06/17/2012 09:57 10/02/2012 13:11  Sodium Latest Range: 135-145 mEq/L 135 132 (L) 137 135 125 (L)  BUN Latest Range: 6-23 mg/dL 61 (H) 52 (H) 48 (H) 15 14  Creatinine Latest Range: 0.50-1.10 mg/dL 4.09 (H) 8.11 (H) 9.14 (H) 1.50 (H) 1.68 (H)   Results for [name], [name] (MRN [id number]) as of 10/02/2012 16:40  Ref. Range 01/09/2012 05:10 01/12/2012 05:25 01/13/2012 06:30 06/17/2012 09:57 10/02/2012 13:11  Hemoglobin Latest Range: 12.0-15.0 g/dL 08.6 (L) 9.6 (L) 9.4 (L) 10.7 (L) 11.3 (L)  HCT Latest Range: 36.0-46.0 % 30.5 (L) 27.0 (L) 26.2 (L) 31.8 (L) 31.6 (L)     1630:  +orthostatic.  SBP improved to 140's after IVF.  H/H per baseline.  BUN/Cr slightly higher than previous.  New hyponatremia. Dx and testing d/w pt and family.  Questions answered.  Verb understanding, agreeable to admit.  T/C to [facility] Dr. Sherrie Mustache, case discussed, including:  HPI, pertinent PM/SHx, VS/PE, dx testing, ED course and treatment:  Agreeable to observation admit, requests to obtain tele bed to team 2.          Kelsey Anger, [name] 10/02/12 1912

## 2012-10-02 NOTE — ED Notes (Signed)
Advised that now room 301 is clean and ready for admission.

## 2012-10-02 NOTE — ED Notes (Signed)
Secretary called Bed Control to find out about bed placement.

## 2012-10-03 ENCOUNTER — Observation Stay (HOSPITAL_COMMUNITY)
Admit: 2012-10-03 | Discharge: 2012-10-03 | Disposition: A | Discharge status: Home / Self Care | Payer: BC Managed Care – PPO | Attending: Internal Medicine | Admitting: Internal Medicine

## 2012-10-03 ENCOUNTER — Encounter (HOSPITAL_COMMUNITY): Payer: Self-pay | Admitting: Internal Medicine

## 2012-10-03 ENCOUNTER — Observation Stay (HOSPITAL_COMMUNITY): Payer: BC Managed Care – PPO

## 2012-10-03 DIAGNOSIS — E871 Hypo-osmolality and hyponatremia: Secondary | ICD-10-CM

## 2012-10-03 DIAGNOSIS — I6529 Occlusion and stenosis of unspecified carotid artery: Secondary | ICD-10-CM

## 2012-10-03 DIAGNOSIS — N182 Chronic kidney disease, stage 2 (mild): Secondary | ICD-10-CM | POA: Diagnosis present

## 2012-10-03 HISTORY — DX: Occlusion and stenosis of unspecified carotid artery: I65.29

## 2012-10-03 LAB — BASIC METABOLIC PANEL
BUN: 13 mg/dL (ref 6–23)
CO2: 23 mEq/L (ref 19–32)
Calcium: 7.8 mg/dL — ABNORMAL LOW (ref 8.4–10.5)
Chloride: 98 mEq/L (ref 96–112)
Creatinine, Ser: 1.51 mg/dL — ABNORMAL HIGH (ref 0.50–1.10)
GFR calc Af Amer: 41 mL/min — ABNORMAL LOW (ref >90)
GFR calc non Af Amer: 35 mL/min — ABNORMAL LOW (ref >90)
Glucose, Bld: 79 mg/dL (ref 70–99)
Potassium: 4.2 mEq/L (ref 3.5–5.1)
Sodium: 131 mEq/L — ABNORMAL LOW (ref 135–145)

## 2012-10-03 LAB — URINE CULTURE
Colony Count: NO GROWTH
Culture: NO GROWTH

## 2012-10-03 LAB — CBC
HCT: 29.2 % — ABNORMAL LOW (ref 36.0–46.0)
Hemoglobin: 10.1 g/dL — ABNORMAL LOW (ref 12.0–15.0)
MCH: 35.6 pg — ABNORMAL HIGH (ref 26.0–34.0)
MCHC: 34.6 g/dL (ref 30.0–36.0)
MCV: 102.8 fL — ABNORMAL HIGH (ref 78.0–100.0)
Platelets: 189 10*3/uL (ref 150–400)
RBC: 2.84 MIL/uL — ABNORMAL LOW (ref 3.87–5.11)
RDW: 13.3 % (ref 11.5–15.5)
WBC: 5.4 10*3/uL (ref 4.0–10.5)

## 2012-10-03 MED ORDER — IRBESARTAN 150 MG PO TABS
150.0000 mg | ORAL_TABLET | Freq: Every day | ORAL | Status: DC
  Administered 2012-10-03: 150 mg via ORAL
  Filled 2012-10-03: qty 1

## 2012-10-03 MED ORDER — VALSARTAN 160 MG PO TABS: 160.0000 mg | ORAL_TABLET | Freq: Every day | ORAL | Status: DC

## 2012-10-03 MED ORDER — THIAMINE HCL 100 MG PO TABS: 100.0000 mg | ORAL_TABLET | Freq: Every day | ORAL | Status: DC

## 2012-10-03 MED ORDER — BUDESONIDE-FORMOTEROL FUMARATE 160-4.5 MCG/ACT IN AERO
2.0000 | INHALATION_SPRAY | Freq: Two times a day (BID) | RESPIRATORY_TRACT | Status: DC | PRN
  Filled 2012-10-03: qty 6

## 2012-10-03 MED ORDER — METOPROLOL TARTRATE 25 MG PO TABS
25.0000 mg | ORAL_TABLET | Freq: Two times a day (BID) | ORAL | Status: DC
  Administered 2012-10-03: 25 mg via ORAL
  Filled 2012-10-03: qty 1

## 2012-10-03 MED ORDER — ASPIRIN EC 81 MG PO TBEC
81.0000 mg | DELAYED_RELEASE_TABLET | Freq: Every day | ORAL | Status: DC
  Administered 2012-10-03: 81 mg via ORAL
  Filled 2012-10-03: qty 1

## 2012-10-03 NOTE — Clinical Social Work Psychosocial (Signed)
     Clinical Social Work Department BRIEF PSYCHOSOCIAL ASSESSMENT 10/03/2012  Patient:  [facility]     Account Number:  [id number]     Admit date:  10/02/2012  Clinical Social Worker:  Santa Genera, CLINICAL SOCIAL WORKER  Date/Time:  10/03/2012 12:00 M  Referred by:  Physician  Date Referred:  10/03/2012 Referred for  SNF Placement   Other Referral:   Interview type:  Patient Other interview type:    PSYCHOSOCIAL DATA Living Status:  FAMILY Admitted from facility:   Level of care:   Primary support name:  Colin Rhein Primary support relationship to patient:  CHILD, ADULT Degree of support available:   Significant    CURRENT CONCERNS Current Concerns  Substance Abuse   Other Concerns:    SOCIAL WORK ASSESSMENT / PLAN CSW met w patient for substance use consult.  Completed SBIRT and shared score of 25 w patient, indicating high risk drinking.  Patient wants to keep information "private."  Acknowledges she has issue w overconsumption of alcohol, started drinking after son's birth.  Drank cocktails in evening w husband.  Currently drinks 4 vodka and ginger ales each evening.  Son is concerned about her drinking and is asking her to stop.  Patient has cut down on her own, and says she will seek help w EAP at her employer [employer]) if she determines that this is kept confidential.  Gave patient additional resources for treatment and encouraged her to keep track of current consumption to monitor.  Patient admits that she is not measuring the vodka, so may be consuming more than 1 oz/drink.  Left information for patient to consider and encouraged getting help w abstinence.   Assessment/plan status:  Referral to [facility] Other assessment/ plan:   Information/referral to community resources:   Rethinking Drinking pamphlet  AA meeting list    PATIENTS/FAMILYS RESPONSE TO PLAN OF CARE: Patient willing to take resources

## 2012-10-03 NOTE — Progress Notes (Signed)
Pt stated" she only used Symbicort as needed, had used once last week and months prior to that". Patient refused medication.

## 2012-10-03 NOTE — Progress Notes (Signed)
EEG completed.

## 2012-10-03 NOTE — Discharge Summary (Signed)
Physician Discharge Summary  [name] ZOX:[id number] DOB: 02/27/1946 DOA: 10/02/2012  PCP: Rudi Heap, MD  Admit date: 10/02/2012 Discharge date: 10/03/2012  Time spent: Greater than 30  minutes  Recommendations for Outpatient Follow-up:  1. The patient was discharged to home in improved condition. She will followup with her primary care physician Dr. Christell Constant next week. She was referred to outpatient resources to help with sobriety. Followup TSH/free T4 recommended in one to 2 months.  Discharge Diagnoses:  1. Orthostatic syncope. 2. Dehydration/volume depletion. 3. Hypovolemic hyponatremia. 4. Alcoholism/alcohol abuse. 5. Hypertension. 6. Macrocytic anemia/history of vitamin B12 deficiency. 7. Acute renal failure superimposed on stage II chronic kidney disease. The patient's creatinine was 1.68 on admission and 1.51 at the time of discharge. 8. Hypokalemia. 9. 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 Hypothyroidism. The patient's TSH was slightly elevated at 4.9 and her free T4 was slightly low at 1.81. No changes were made in her Synthroid dosing given recent "normal" outpatient lab tests. Recommend followup TSH/free T4 in one to 2 months.  Discharge Condition: Improved.  Diet recommendation: Regular/heart healthy.  Filed Weights   10/02/12 1151  Weight: 74.844 kg (165 lb)    History of present illness:  The patient is a 66 year old woman with a history significant for hypertension, depression, and alcoholism, who presented to the emergency department on 10/02/2012 with 2 episodes of transient syncope. In the emergency department, she was noted to be afebrile and hemodynamically stable. However, her  blood pressure fell from 149/70 to 91/57 upon standing. Her lab data were significant for a hemoglobin of 11.3, sodium of 125, potassium of 3.4, and creatinine 1.68. CT scan of her head revealed no acute intracranial findings. Her d-dimer was elevated, and  therefore, a VQ scan was ordered. The VQ scan revealed low probability for pulmonary embolism. She was admitted for further evaluation and management.  Hospital Course:  The patient had been taking hydrochlorothiazide twice daily in the form of hydrochlorothiazide and valsartan/HCTZ. Stand-alone hydrochlorothiazide was subsequently discontinued approximately one to 2 weeks ago. Also, the patient had been restarted on Cymbalta. Her history was also significant for her drinking 2-4 alcoholic beverages with vodka daily. On admission, she had not drunk any alcohol for the entire day. She admitted to alcohol abuse/alcoholism, but was trying to cut down. Her antihypertensive medications were held. IV fluid hydration was started with normal saline. Cymbalta was also withheld. The Ativan alcohol withdrawal protocol was started with when necessary Ativan and vitamin therapy. Protonix was started prophylactically. Neuro checks were ordered every 4 hours. The clinical social worker was consulted to assist the patient with alcohol abstinence and referral to appropriate outpatient services. She was supplemented with potassium chloride in the IV fluids and orally. Her magnesium level was assessed and it was within normal limits at 1.6.  For further evaluation, a number of studies were ordered. Her TSH was slightly elevated at 4.9 and her free T4 was borderline low at 1.81. The patient informed me that recent outpatient blood work revealed normal thyroid function tests. Therefore, she was maintained on her current dosing of Synthroid. However, her TSH and free T4 will need to be monitored closely for followup assessment. Her carotid ultrasound revealed 50-69% narrowing on the right and less than 50% diameter stenosis on the left. Aspirin was ordered. It was later discontinued after the risk-benefit was assessed. I discussed this with the patient and her son. The conclusion was that the risk of bleeding while she was drinking  alcohol or while she was orthostatic out weighed the benefit of primary prevention of stroke.  The results of the EEG were pending at the time of discharge.  Neurologist, Dr. Gerilyn Pilgrim was consulted. He believed that the patient's syncope was likely secondary to hyponatremia and orthostatic hypotension, but agreed that an EEG needed to be done for further assessment.  Following IV fluid hydration, the patient's serum sodium improved to 131, not within normal limits, but significantly improved. Her serum potassium improved to 4.2. Her creatinine improved to 1.51 which is about at baseline. Her hemoglobin decreased to 10.1, but this was secondary to the dilutional effects of IV fluids. She has macrocytic anemia secondary to vitamin B12 deficiency. She was still mildly orthostatic but less so compared to orthostatic vital signs taken on admission. She had no signs or symptoms consistent with alcohol withdrawal syndrome. She was strongly advised to stop drinking. She was encouraged to seek help with sobriety. Her son agreed and was supportive.  She was hemodynamic stable and asymptomatic at the time of hospital discharge. She ambulated with the nursing staff with no presyncope or syncopal symptomatology. She was informed that the dictating physician would call her with the results of the EEG when available.  Of note, the patient was restarted on valsartan and metoprolol but not hydrochlorothiazide. She was advised to discontinue valsartan/HCTZ. A prescription was given for valsartan alone. HCTZ was discontinued because of hyponatremia.   Procedures:  EEG. Results pending.  Consultations:  Neurologist, Beryle Beams, M.D.  Discharge Exam: Filed Vitals:   10/03/12 0448 10/03/12 0449 10/03/12 0451 10/03/12 1051  BP: 127/76 123/77 105/70 160/64  Pulse: 80 81 90 88  Temp: 98.2 F (36.8 C)   98.4 F (36.9 C)  TempSrc: Oral   Oral  Resp: 20   18  Height:      Weight:      SpO2: 99%   100%     General: Pleasant 66 year old Caucasian woman sitting up in bed, in no acute distress. Cardiovascular: S1, S2, with no murmurs rubs gallops. Respiratory: Clear to auscultation bilaterally. Neurologic: She is alert and oriented x3. Cranial nerves II through XII are intact. No signs of tremulousness or anxiety.  Discharge Instructions      Discharge Orders    Future Orders Please Complete By Expires   Diet general      Increase activity slowly      Discharge instructions      Comments:   Do not drink alcoholic beverages. Stay hydrated with water, Gatorade, and other non-alcoholic beverages.       Medication List     As of 10/03/2012  3:34 PM    STOP taking these medications         valsartan-hydrochlorothiazide 160-25 MG per tablet   Commonly known as: DIOVAN-HCT      TAKE these medications         acetaminophen 500 MG tablet   Commonly known as: TYLENOL   Take 500 mg by mouth every 6 (six) hours as needed. Pain      albuterol 108 (90 BASE) MCG/ACT inhaler   Commonly known as: PROVENTIL HFA;VENTOLIN HFA   Inhale 2 puffs into the lungs every 6 (six) hours as needed. Shortness of Breath      budesonide-formoterol 160-4.5 MCG/ACT inhaler   Commonly known as: SYMBICORT   Inhale 2 puffs into the lungs 2 (two) times daily.      calcium carbonate 500 MG chewable tablet   Commonly known as:  TUMS - dosed in mg elemental calcium   Chew 1 tablet (200 mg of elemental calcium total) by mouth 3 (three) times daily with meals.      cyanocobalamin 1000 MCG/ML injection   Commonly known as: (VITAMIN B-12)   Inject 1,000 mcg into the muscle every 30 (thirty) days.      estrogen-methylTESTOSTERone 0.625-1.25 MG per tablet   Take 1 tablet by mouth every morning.      levothyroxine 25 MCG tablet   Commonly known as: SYNTHROID, LEVOTHROID   Take 25 mcg by mouth daily before breakfast. Take with 1 tablet of the 100 mcg daily for a total dose of 112.5 mcg      levothyroxine 100  MCG tablet   Commonly known as: SYNTHROID, LEVOTHROID   Take 100 mcg by mouth daily before breakfast. Take with 1/2 tablet of the 25 mcg for a total dose of 112.5 mcg.      metoprolol tartrate 25 MG tablet   Commonly known as: LOPRESSOR   Take 1 tablet (25 mg total) by mouth 2 (two) times daily.      thiamine 100 MG tablet   Take 1 tablet (100 mg total) by mouth daily.      valsartan 160 MG tablet   Commonly known as: DIOVAN   Take 1 tablet (160 mg total) by mouth daily.         Follow-up Information    Follow up with Rudi Heap, MD. In 1 week. (Tuesday Oct. 29   )    Contact information:   [address] [place] [address] [phone]           The results of significant diagnostics from this hospitalization (including imaging, microbiology, ancillary and laboratory) are listed below for reference.    Significant Diagnostic Studies: Dg Chest 2 View  10/02/2012  *RADIOLOGY REPORT*  Clinical Data: Syncope  CHEST - 2 VIEW  Comparison: 01/09/2012  Findings: Apical pleural and  parenchymal scarring bilaterally is unchanged.  Negative for heart failure or pneumonia.  No mass lesion.  IMPRESSION: No change from the prior study.  Apical scarring bilaterally.  No acute abnormality.   Original Report Authenticated By: Camelia Phenes, M.D.    Ct Head Wo Contrast  10/02/2012  *RADIOLOGY REPORT*  Clinical Data: Loss of consciousness, fell in parking lot, history hypertension  CT HEAD WITHOUT CONTRAST  Technique:  Contiguous axial images were obtained from the base of the skull through the vertex without contrast.  Comparison: None  Findings: Generalized atrophy. Normal ventricular morphology. No midline shift or mass effect. Small vessel chronic ischemic changes of deep cerebral white matter. No intracranial hemorrhage, mass lesion, or acute infarction. Visualized paranasal sinuses and mastoid air cells clear. Bones unremarkable.  IMPRESSION: No acute intracranial abnormalities.    Original Report Authenticated By: Lollie Marrow, M.D.    Nm Pulmonary Perfusion  10/02/2012  *RADIOLOGY REPORT*  Clinical Data: Syncope, hypotension, elevated D-dimer; impaired renal function  NM PULMONARY PERFUSION PARTICULATE  Radiopharmaceutical: 2.9MILLI CURIE MAA TECHNETIUM TO 74M ALBUMIN AGGREGATED  Comparison: Chest radiograph 10/02/2012  Findings: Single tiny subsegmental perfusion defect identified at lateral right mid lung, corresponding to a focus of pleural thickening/scarring on chest radiograph at the lateral margin of the minor fissure. Remaining perfusion is normal. No additional pulmonary perfusion defects identified. Ventilation exam not performed.  IMPRESSION: Very low probability for pulmonary embolism.   Original Report Authenticated By: Lollie Marrow, M.D.    US Carotid Duplex Bilateral  10/03/2012  *RADIOLOGY REPORT*  Clinical Data: Syncope, history hypertension, stroke 5 years ago, history smoking  BILATERAL CAROTID DUPLEX ULTRASOUND  Technique: Wallace Cullens scale imaging, color Doppler and duplex ultrasound was performed of bilateral carotid and vertebral arteries in the neck.  Comparison:  None  Criteria:  Quantification of carotid stenosis is based on velocity parameters that correlate the residual internal carotid diameter with NASCET-based stenosis levels, using the diameter of the distal internal carotid lumen as the denominator for stenosis measurement.  The following velocity measurements were obtained:                   PEAK SYSTOLIC/END DIASTOLIC RIGHT ICA:                        173/17cm/sec CCA:                        117/15cm/sec SYSTOLIC ICA/CCA RATIO:     1.48 DIASTOLIC ICA/CCA RATIO:    1.17 ECA:                        170cm/sec  LEFT ICA:                        115/31cm/sec CCA:                        117/17cm/sec SYSTOLIC ICA/CCA RATIO:     0.99 DIASTOLIC ICA/CCA RATIO:    1.84 ECA:                        174cm/sec  Findings:  RIGHT CAROTID ARTERY: Intimal thickening right  CCA.  Scattered calcified noncalcified atherosclerotic plaques right CCA and right carotid bulb into proximal right ICA.  Turbulent blood flow on color Doppler imaging at right ECA and ICA.  Spectral broadening right ICA on waveform analysis with associated increased peak systolic velocity.  RIGHT VERTEBRAL ARTERY:  Patent, antegrade  LEFT CAROTID ARTERY: Intimal thickening left CCA.  Small noncalcified atherosclerotic plaques at left carotid bulb.  Few small calcified nonshadowing plaques at proximal left ICA.  Laminar flow on color Doppler imaging within left ICA with spectral broadening on waveform analysis.  No high velocity jets.  LEFT VERTEBRAL ARTERY:  Patent, antegrade  IMPRESSION:  Mild plaque formation bilaterally the carotid systems. By gray scale imaging the observed plaques appear less than 50% diameter and do not grossly appear significant. Peak systolic velocity within the right ICA however suggest a slightly greater degree of narrowing in the 50-69% diameter range. Less than 50% diameter stenosis left ICA. Follow-up assessment recommended in 1 year.   Original Report Authenticated By: Lollie Marrow, M.D.     Microbiology: No results found for this or any previous visit (from the past 240 hour(s)).   Labs: Basic Metabolic Panel:  Lab 10/03/12 1610 10/02/12 1311  NA 131* 125*  K 4.2 3.4*  CL 98 87*  CO2 23 22  GLUCOSE 79 83  BUN 13 14  CREATININE 1.51* 1.68*  CALCIUM 7.8* 9.1  MG -- 1.6  PHOS -- 2.6   Liver Function Tests: No results found for this basename: AST:5,ALT:5,ALKPHOS:5,BILITOT:5,PROT:5,ALBUMIN:5 in the last 168 hours No results found for this basename: LIPASE:5,AMYLASE:5 in the last 168 hours No results found for this basename: AMMONIA:5 in the last 168 hours CBC:  Lab 10/03/12 0437 10/02/12  1311  WBC 5.4 8.1  NEUTROABS -- 5.4  HGB 10.1* 11.3*  HCT 29.2* 31.6*  MCV 102.8* 101.0*  PLT 189 246   Cardiac Enzymes:  Lab 10/02/12 1311  CKTOTAL --  CKMB --    CKMBINDEX --  TROPONINI <0.30   BNP: BNP (last 3 results) No results found for this basename: PROBNP:3 in the last 8760 hours CBG: No results found for this basename: GLUCAP:5 in the last 168 hours     Signed:  Jode Lippe  [facility] 10/03/2012, 3:34 PM

## 2012-10-03 NOTE — Procedures (Signed)
[facility] Analiese Krupka A. Gerilyn Pilgrim, MD     [email or web address]        FACILITY: [facility]  PHYSICIAN: Nieko Clarin A. Gerilyn Pilgrim, M.D.  DATE OF PROCEDURE: 10/03/2012 EEG INTERPRETATION  INDICATION: This is a 66 year old female who presents with recurrent spells of staring and unresponsiveness. The spells are concerning for possible seizures and therefore it EEG is obtained.  MEDICATIONS:  Scheduled Meds:   . potassium chloride  30 mEq Oral BID  . DISCONTD: aspirin EC  81 mg Oral Daily  . DISCONTD: budesonide-formoterol  2 puff Inhalation BID  . DISCONTD: calcium carbonate  1 tablet Oral TID WC  . DISCONTD: estrogen-methylTESTOSTERone  1 tablet Oral q morning - 10a  . DISCONTD: folic acid  1 mg Oral Daily  . DISCONTD: irbesartan  150 mg Oral Daily  . DISCONTD: levothyroxine  100 mcg Oral QAC breakfast  . DISCONTD: levothyroxine  12.5 mcg Oral QAC breakfast  . DISCONTD: metoprolol tartrate  25 mg Oral BID  . DISCONTD: multivitamin with minerals  1 tablet Oral Daily  . DISCONTD: pantoprazole  40 mg Oral Daily  . DISCONTD: thiamine  100 mg Intravenous Daily  . DISCONTD: thiamine  100 mg Oral Daily   Continuous Infusions:   . DISCONTD: 0.9 % NaCl with KCl 20 mEq / L     PRN Meds:.DISCONTD: acetaminophen, DISCONTD: acetaminophen, DISCONTD: albuterol, DISCONTD: alum & mag hydroxide-simeth, DISCONTD: budesonide-formoterol, DISCONTD: LORazepam, DISCONTD: LORazepam, DISCONTD: ondansetron (ZOFRAN) IV, DISCONTD: ondansetron, DISCONTD: oxyCODONE   ANALYSIS: A 16-channel recording using standard 10/20 measurements is conducted for 22 minutes. There is a well-formed posterior dominant rhythm of 7-1/2-8 Hz which attenuates with eye opening. There is beta activity observed in the frontal areas. Photic stimulation is carried out without abnormal changes in the back and into the fingers. Awake and drowsy activities are recorded. There is no focal or lateralized slowing. There is no epileptiform activity  observed.   IMPRESSION:  This recording shows mild generalized slowing for age. Otherwise, the recording shows no epileptiform activity.  Jerid Catherman A. Gerilyn Pilgrim, M.D. Diplomate, Biomedical engineer of Psychiatry and Neurology ( Neurology).

## 2012-10-03 NOTE — Progress Notes (Signed)
Pt discharged to home with son. Pt. Is alert and oriented. Pt is hemodynamically stable. AVS reviewed with pt. Capable of re verbalizing medication regimen. Discharge plan appropriate and in place. Allena Earing 10/03/2012 3:38 PM

## 2012-10-03 NOTE — Consult Note (Signed)
[facility] Katai Marsico A. Gerilyn Pilgrim, MD     [email or web address]          [name] is an 66 y.o. female.  HPI:  Past Medical History  Diagnosis Date  . Hypertension   . Menopause   . Hypothyroid   . Mini stroke   . Hyperlipidemia   . B12 deficiency   . Shortness of breath   . Headache   . Anxiety   . Pneumonia   . Anemia   . Depression   . Stroke 2010    no deficits  . Alcoholism 01/09/2012  . Hyponatremia 01/09/2012  . Chronic kidney disease (CKD), stage II (mild)   . COPD (chronic obstructive pulmonary disease)     Past Surgical History  Procedure Date  . Appendectomy   . Tonsillectomy   . Cataract extraction w/phaco 06/20/2012    Procedure: CATARACT EXTRACTION PHACO AND INTRAOCULAR LENS PLACEMENT (IOC);  Surgeon: Gemma Payor, MD;  Location: [facility];  Service: Ophthalmology;  Laterality: Right;  CDE:10.66  . Cataract extraction w/phaco 07/11/2012    Procedure: CATARACT EXTRACTION PHACO AND INTRAOCULAR LENS PLACEMENT (IOC);  Surgeon: Gemma Payor, MD;  Location: [facility];  Service: Ophthalmology;  Laterality: Left;  CDE: 12.69    History reviewed. No pertinent family history.  Social History:  reports that she quit smoking about 20 years ago. Her smoking use included Cigarettes. She has a 60 pack-year smoking history. She does not have any smokeless tobacco history on file. She reports that she drinks about one ounce of alcohol per week. She reports that she does not use illicit drugs.  Allergies:  Allergies  Allergen Reactions  . Norvasc (Amlodipine Besylate) Hives  . Procardia (Nifedipine) Swelling    Edema      Medications:  Prior to Admission medications   Medication Sig Start Date End Date Taking? Authorizing Provider  acetaminophen (TYLENOL) 500 MG tablet Take 500 mg by mouth every 6 (six) hours as needed. Pain   Yes Historical Provider, MD  albuterol (PROVENTIL HFA;VENTOLIN HFA) 108 (90 BASE) MCG/ACT inhaler Inhale 2 puffs into the lungs every 6 (six)  hours as needed. Shortness of Breath   Yes Historical Provider, MD  budesonide-formoterol (SYMBICORT) 160-4.5 MCG/ACT inhaler Inhale 2 puffs into the lungs 2 (two) times daily.   Yes Historical Provider, MD  calcium carbonate (TUMS - DOSED IN MG ELEMENTAL CALCIUM) 500 MG chewable tablet Chew 1 tablet (200 mg of elemental calcium total) by mouth 3 (three) times daily with meals. 01/17/12 01/16/13 Yes Altha Harm, MD  cyanocobalamin (,VITAMIN B-12,) 1000 MCG/ML injection Inject 1,000 mcg into the muscle every 30 (thirty) days.   Yes Historical Provider, MD  estrogen-methylTESTOSTERone (ESTRATEST HS) 0.625-1.25 MG per tablet Take 1 tablet by mouth every morning.    Yes Historical Provider, MD  levothyroxine (SYNTHROID, LEVOTHROID) 100 MCG tablet Take 100 mcg by mouth daily before breakfast. Take with 1/2 tablet of the 25 mcg for a total dose of 112.5 mcg.   Yes Historical Provider, MD  levothyroxine (SYNTHROID, LEVOTHROID) 25 MCG tablet Take 25 mcg by mouth daily before breakfast. Take with 1 tablet of the 100 mcg daily for a total dose of 112.5 mcg   Yes Historical Provider, MD  metoprolol tartrate (LOPRESSOR) 25 MG tablet Take 1 tablet (25 mg total) by mouth 2 (two) times daily. 01/17/12 01/16/13 Yes Altha Harm, MD  valsartan-hydrochlorothiazide (DIOVAN-HCT) 160-25 MG per tablet Take 1 tablet by mouth daily.   Yes Historical Provider, MD  Scheduled Meds:   . budesonide-formoterol  2 puff Inhalation BID  . calcium carbonate  1 tablet Oral TID WC  . estrogen-methylTESTOSTERone  1 tablet Oral q morning - 10a  . folic acid  1 mg Oral Daily  . levothyroxine  100 mcg Oral QAC breakfast  . levothyroxine  12.5 mcg Oral QAC breakfast  . multivitamin with minerals  1 tablet Oral Daily  . pantoprazole  40 mg Oral Daily  . potassium chloride  30 mEq Oral BID  . thiamine  100 mg Oral Daily   Or  . thiamine  100 mg Intravenous Daily  . DISCONTD: sodium chloride  500 mL Intravenous Once    Continuous Infusions:   . 0.9 % NaCl with KCl 20 mEq / L    . DISCONTD: sodium chloride     PRN Meds:.acetaminophen, acetaminophen, albuterol, alum & mag hydroxide-simeth, LORazepam, LORazepam, ondansetron (ZOFRAN) IV, ondansetron, oxyCODONE, technetium albumin aggregated   Results for orders placed during the hospital encounter of 10/02/12 (from the past 48 hour(s))  BASIC METABOLIC PANEL     Status: Abnormal   Collection Time   10/02/12  1:11 PM      Component Value Range Comment   Sodium 125 (*) 135 - 145 mEq/L    Potassium 3.4 (*) 3.5 - 5.1 mEq/L    Chloride 87 (*) 96 - 112 mEq/L    CO2 22  19 - 32 mEq/L    Glucose, Bld 83  70 - 99 mg/dL    BUN 14  6 - 23 mg/dL    Creatinine, Ser 1.61 (*) 0.50 - 1.10 mg/dL    Calcium 9.1  8.4 - 09.6 mg/dL    GFR calc non Af Amer 31 (*) >90 mL/min    GFR calc Af Amer 36 (*) >90 mL/min   CBC WITH DIFFERENTIAL     Status: Abnormal   Collection Time   10/02/12  1:11 PM      Component Value Range Comment   WBC 8.1  4.0 - 10.5 K/uL    RBC 3.13 (*) 3.87 - 5.11 MIL/uL    Hemoglobin 11.3 (*) 12.0 - 15.0 g/dL    HCT 04.5 (*) 40.9 - 46.0 %    MCV 101.0 (*) 78.0 - 100.0 fL    MCH 36.1 (*) 26.0 - 34.0 pg    MCHC 35.8  30.0 - 36.0 g/dL    RDW 81.1  91.4 - 78.2 %    Platelets 246  150 - 400 K/uL    Neutrophils Relative 67  43 - 77 %    Neutro Abs 5.4  1.7 - 7.7 K/uL    Lymphocytes Relative 23  12 - 46 %    Lymphs Abs 1.8  0.7 - 4.0 K/uL    Monocytes Relative 9  3 - 12 %    Monocytes Absolute 0.7  0.1 - 1.0 K/uL    Eosinophils Relative 2  0 - 5 %    Eosinophils Absolute 0.1  0.0 - 0.7 K/uL    Basophils Relative 0  0 - 1 %    Basophils Absolute 0.0  0.0 - 0.1 K/uL   TROPONIN I     Status: Normal   Collection Time   10/02/12  1:11 PM      Component Value Range Comment   Troponin I <0.30  <0.30 ng/mL   D-DIMER, QUANTITATIVE     Status: Abnormal   Collection Time   10/02/12  1:11 PM  Component Value Range Comment   D-Dimer, Quant 2.31  (*) 0.00 - 0.48 ug/mL-FEU   MAGNESIUM     Status: Normal   Collection Time   10/02/12  1:11 PM      Component Value Range Comment   Magnesium 1.6  1.5 - 2.5 mg/dL   PHOSPHORUS     Status: Normal   Collection Time   10/02/12  1:11 PM      Component Value Range Comment   Phosphorus 2.6  2.3 - 4.6 mg/dL   TSH     Status: Abnormal   Collection Time   10/02/12  1:11 PM      Component Value Range Comment   TSH 4.908 (*) 0.350 - 4.500 uIU/mL   T4, FREE     Status: Abnormal   Collection Time   10/02/12  1:11 PM      Component Value Range Comment   Free T4 1.81 (*) 0.80 - 1.80 ng/dL   URINALYSIS, ROUTINE W REFLEX MICROSCOPIC     Status: Abnormal   Collection Time   10/02/12  3:05 PM      Component Value Range Comment   Color, Urine YELLOW  YELLOW    APPearance CLEAR  CLEAR    Specific Gravity, Urine 1.010  1.005 - 1.030    pH 6.0  5.0 - 8.0    Glucose, UA NEGATIVE  NEGATIVE mg/dL    Hgb urine dipstick NEGATIVE  NEGATIVE    Bilirubin Urine NEGATIVE  NEGATIVE    Ketones, ur NEGATIVE  NEGATIVE mg/dL    Protein, ur NEGATIVE  NEGATIVE mg/dL    Urobilinogen, UA 0.2  0.0 - 1.0 mg/dL    Nitrite NEGATIVE  NEGATIVE    Leukocytes, UA TRACE (*) NEGATIVE   URINE MICROSCOPIC-ADD ON     Status: Abnormal   Collection Time   10/02/12  3:05 PM      Component Value Range Comment   Squamous Epithelial / LPF FEW (*) RARE    WBC, UA 3-6  <3 WBC/hpf    RBC / HPF 0-2  <3 RBC/hpf    Bacteria, UA FEW (*) RARE   CBC     Status: Abnormal   Collection Time   10/03/12  4:37 AM      Component Value Range Comment   WBC 5.4  4.0 - 10.5 K/uL    RBC 2.84 (*) 3.87 - 5.11 MIL/uL    Hemoglobin 10.1 (*) 12.0 - 15.0 g/dL    HCT 16.1 (*) 09.6 - 46.0 %    MCV 102.8 (*) 78.0 - 100.0 fL    MCH 35.6 (*) 26.0 - 34.0 pg    MCHC 34.6  30.0 - 36.0 g/dL    RDW 04.5  40.9 - 81.1 %    Platelets 189  150 - 400 K/uL DELTA CHECK NOTED  BASIC METABOLIC PANEL     Status: Abnormal   Collection Time   10/03/12  4:37 AM       Component Value Range Comment   Sodium 131 (*) 135 - 145 mEq/L    Potassium 4.2  3.5 - 5.1 mEq/L DELTA CHECK NOTED   Chloride 98  96 - 112 mEq/L    CO2 23  19 - 32 mEq/L    Glucose, Bld 79  70 - 99 mg/dL    BUN 13  6 - 23 mg/dL    Creatinine, Ser 9.14 (*) 0.50 - 1.10 mg/dL    Calcium 7.8 (*) 8.4 - 10.5 mg/dL  GFR calc non Af Amer 35 (*) >90 mL/min    GFR calc Af Amer 41 (*) >90 mL/min     Dg Chest 2 View  10/02/2012  *RADIOLOGY REPORT*  Clinical Data: Syncope  CHEST - 2 VIEW  Comparison: 01/09/2012  Findings: Apical pleural and  parenchymal scarring bilaterally is unchanged.  Negative for heart failure or pneumonia.  No mass lesion.  IMPRESSION: No change from the prior study.  Apical scarring bilaterally.  No acute abnormality.   Original Report Authenticated By: Camelia Phenes, M.D.    Ct Head Wo Contrast  10/02/2012  *RADIOLOGY REPORT*  Clinical Data: Loss of consciousness, fell in parking lot, history hypertension  CT HEAD WITHOUT CONTRAST  Technique:  Contiguous axial images were obtained from the base of the skull through the vertex without contrast.  Comparison: None  Findings: Generalized atrophy. Normal ventricular morphology. No midline shift or mass effect. Small vessel chronic ischemic changes of deep cerebral white matter. No intracranial hemorrhage, mass lesion, or acute infarction. Visualized paranasal sinuses and mastoid air cells clear. Bones unremarkable.  IMPRESSION: No acute intracranial abnormalities.   Original Report Authenticated By: Lollie Marrow, M.D.    Nm Pulmonary Perfusion  10/02/2012  *RADIOLOGY REPORT*  Clinical Data: Syncope, hypotension, elevated D-dimer; impaired renal function  NM PULMONARY PERFUSION PARTICULATE  Radiopharmaceutical: 2.9MILLI CURIE MAA TECHNETIUM TO 55M ALBUMIN AGGREGATED  Comparison: Chest radiograph 10/02/2012  Findings: Single tiny subsegmental perfusion defect identified at lateral right mid lung, corresponding to a focus of pleural  thickening/scarring on chest radiograph at the lateral margin of the minor fissure. Remaining perfusion is normal. No additional pulmonary perfusion defects identified. Ventilation exam not performed.  IMPRESSION: Very low probability for pulmonary embolism.   Original Report Authenticated By: Lollie Marrow, M.D.     Review of Systems  Constitutional: Negative.   HENT: Negative.   Eyes: Negative.   Respiratory: Negative.   Cardiovascular: Negative.   Gastrointestinal: Negative.   Genitourinary: Negative.   Musculoskeletal: Negative.   Skin: Negative.   Endo/Heme/Allergies: Negative.   Psychiatric/Behavioral: Negative.    Blood pressure 105/70, pulse 90, temperature 98.2 F (36.8 C), temperature source Oral, resp. rate 20, height 5\' 9"  (1.753 m), weight 74.844 kg (165 lb), SpO2 99.00%. Physical Exam  Assessment/Plan:   The patient's semiology and spells seems consistent with orthostatic syncope. While the patient does have a history of other cause. The spell of unresponsiveness's and staring seems unusual for alcohol withdrawal seizures. She could have complex partial seizure although this would not necessary fit the clinical scenario this time. The EEG is ordered and will be followed. History of vitamin B12 deficiency and other cause of but no clear evidence of significant neuropathy on physical examination which can be associated with autonomic dysfunction and orthostatic hypotension.    The patient is a 66 year old white female who was at her doctor's office on yesterday when she stood up to go to the reception area. She became unresponsive with staring spells and apparently nearly fell. She was taken to the emergency room for evaluation and the on entering the emergency room she passed out. The patient was found to be quite orthostatic with a 30 point drop in her systolic blood pressure. She in fact continues to have evidence of orthostasis. She did work some mostly for orthostatic  hypotension and orthostatic syncope but because of the staring spells and unresponsiveness a neurological consultation is obtained. She does have a baseline history of alcoholism. She previously had something  to drink about a day before she presented to the hospital. She does have a history of B12 deficiency but is getting monthly injections.  GENERAL: This very pleasant female in no acute distress.  HEENT: Supple. Atraumatic normocephalic. 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  ABDOMEN: soft  EXTREMITIES: No edema   BACK: Normal.  SKIN: Normal by inspection.    MENTAL STATUS: Alert and oriented. Speech, language and cognition are generally intact. Judgment and insight normal.   CRANIAL NERVES: Pupils are equal, round and reactive to light and accomodation; extra ocular movements are full, there is no significant nystagmus; visual fields are full; upper and lower facial muscles are normal in strength and symmetric, there is no flattening of the nasolabial folds; tongue is midline; uvula is midline; shoulder elevation is normal.  MOTOR: Normal tone, bulk and strength; no pronator drift.  COORDINATION: Left finger to nose is normal, right finger to nose is normal, No rest tremor; no intention tremor; no postural tremor; no bradykinesia.  REFLEXES: Deep tendon reflexes are symmetrical and normal. Babinski reflexes are flexor bilaterally.   SENSATION: Normal to light touch, temperature, and pain.   [name] 10/03/2012, 6:49 AM

## 2012-10-03 NOTE — Progress Notes (Signed)
UR Chart Review Completed  

## 2012-10-07 ENCOUNTER — Other Ambulatory Visit: Payer: Self-pay | Admitting: Family Medicine

## 2012-10-07 DIAGNOSIS — R7989 Other specified abnormal findings of blood chemistry: Secondary | ICD-10-CM

## 2012-10-09 ENCOUNTER — Ambulatory Visit (HOSPITAL_COMMUNITY): Payer: BC Managed Care – PPO

## 2012-10-18 ENCOUNTER — Ambulatory Visit (HOSPITAL_COMMUNITY)
Admission: RE | Admit: 2012-10-18 | Discharge: 2012-10-18 | Disposition: A | Discharge status: Home / Self Care | Payer: BC Managed Care – PPO | Source: Ambulatory Visit | Attending: Family Medicine | Admitting: Family Medicine

## 2012-10-18 DIAGNOSIS — R7989 Other specified abnormal findings of blood chemistry: Secondary | ICD-10-CM

## 2013-02-25 ENCOUNTER — Encounter: Payer: Self-pay | Admitting: Nurse Practitioner

## 2013-02-25 ENCOUNTER — Ambulatory Visit (INDEPENDENT_AMBULATORY_CARE_PROVIDER_SITE_OTHER): Payer: BC Managed Care – PPO | Admitting: Nurse Practitioner

## 2013-02-25 VITALS — BP 139/81 | HR 74 | Temp 98.0°F | Wt 171.0 lb

## 2013-02-25 DIAGNOSIS — J441 Chronic obstructive pulmonary disease with (acute) exacerbation: Secondary | ICD-10-CM

## 2013-02-25 MED ORDER — LEVALBUTEROL HCL 1.25 MG/3ML IN NEBU
1.2500 mg | INHALATION_SOLUTION | RESPIRATORY_TRACT | Status: AC
  Administered 2013-02-25: 1.25 mg via RESPIRATORY_TRACT

## 2013-02-25 MED ORDER — HYDROCODONE-HOMATROPINE 5-1.5 MG/5ML PO SYRP: 5.0000 mL | ORAL_SOLUTION | Freq: Four times a day (QID) | ORAL | Status: DC | PRN

## 2013-02-25 MED ORDER — PREDNISONE (PAK) 10 MG PO TABS: 10.0000 mg | ORAL_TABLET | Freq: Every day | ORAL | Status: DC

## 2013-02-25 MED ORDER — ACLIDINIUM BROMIDE 400 MCG/ACT IN AEPB: 1.0000 | INHALATION_SPRAY | Freq: Two times a day (BID) | RESPIRATORY_TRACT | Status: DC

## 2013-02-25 NOTE — Patient Instructions (Signed)
Continue Symbicort as RX Albuterol HFA PRN Spirivia daily Rest RTO if worsens

## 2013-02-25 NOTE — Progress Notes (Signed)
  Subjective:    Patient ID: [name], female    DOB: Jan 31, 1946, 67 y.o.   MRN: [id number]  HPI BP 139/81  Pulse 74  Temp(Src) 98 F (36.7 C) (Oral)  Wt 171 lb (77.565 kg)  BMI 25.24 kg/m2  SpO2 96%  LMP 02/04/2013 Patient in C/O SOB that started 1weeks ago. It is exacerbated by activity, talking or laughing. Seems to be worse upon awaking. Associated symptomscough, shortness of breath and sputum changes. Patient denies hemoptysis, pleuritic pain and stridor. Patient had some left over cough meds with codeine which helped some during the night. Has had to use albuterol inhaler everyday for the last week.      Review of Systems     Objective:   Physical Exam  Constitutional: She is oriented to person, place, and time. She appears well-developed and well-nourished.  HENT:  Head: Normocephalic.  Nose: Nose normal.  Mouth/Throat: Oropharynx is clear and moist.  Eyes: Pupils are equal, round, and reactive to light.  Neck: Normal range of motion. Neck supple.  Cardiovascular: Normal rate, regular rhythm and normal heart sounds.   Pulmonary/Chest: She has wheezes (faint in bilateral lower lobes).  Breath sounds are course with deep dry cough.  Neurological: She is alert and oriented to person, place, and time. She has normal reflexes.  Skin: Skin is warm and dry.  Psychiatric: She has a normal mood and affect. Her behavior is normal. Judgment and thought content normal.          Assessment & Plan:  A: Acute exacerbation of chronic Bronchitis  P: Continue Symbicort as prescribed     Albuterol PRN     Add spirivia- use taught- SE reviewed     Prednisone dose pack as rx     RTO prn

## 2013-02-27 ENCOUNTER — Telehealth: Payer: Self-pay | Admitting: *Deleted

## 2013-02-27 ENCOUNTER — Other Ambulatory Visit: Payer: Self-pay | Admitting: Nurse Practitioner

## 2013-02-27 MED ORDER — TRAMADOL HCL 50 MG PO TABS: 50.0000 mg | ORAL_TABLET | Freq: Three times a day (TID) | ORAL | Status: DC | PRN

## 2013-02-27 NOTE — Telephone Encounter (Signed)
Tell her to take 4 regular strength motrin. OK to extend work note through tomorrow.

## 2013-02-27 NOTE — Telephone Encounter (Signed)
Unable to take motrin due to kidney functions

## 2013-02-27 NOTE — Telephone Encounter (Signed)
NEEDS SOMETHING CALLED IN FOR A TERRIBLE HA. SHE WASN'T ABLE TO GO BACK TO WORK TODAY EITHER WILL NEED NOTE EXTENDED

## 2013-02-27 NOTE — Progress Notes (Signed)
Pt aware meds are at [facility]

## 2013-03-11 ENCOUNTER — Other Ambulatory Visit: Payer: Self-pay | Admitting: Family Medicine

## 2013-03-23 ENCOUNTER — Other Ambulatory Visit: Payer: Self-pay | Admitting: Family Medicine

## 2013-03-24 NOTE — Telephone Encounter (Signed)
You increased to on 12/10/13 labs and said to return in 6 weeks. This request if for . Chart sent back

## 2013-03-24 NOTE — Telephone Encounter (Signed)
Patient suppose to be on Levothyroxine . Rx request was Levothyroxine 1oomcg. What has she been taking

## 2013-03-25 NOTE — Telephone Encounter (Signed)
CALLED IN TO [facility] O RF NEEDS TO BE SEEN

## 2013-05-02 ENCOUNTER — Telehealth: Payer: Self-pay | Admitting: Nurse Practitioner

## 2013-05-06 MED ORDER — LEVOTHYROXINE SODIUM 25 MCG PO TABS: 25.0000 ug | ORAL_TABLET | Freq: Every day | ORAL | Status: DC

## 2013-05-06 MED ORDER — LEVOTHYROXINE SODIUM 100 MCG PO TABS: 100.0000 ug | ORAL_TABLET | Freq: Every day | ORAL | Status: DC

## 2013-05-06 NOTE — Telephone Encounter (Signed)
Make sure patient keeps f/u appointment

## 2013-05-20 ENCOUNTER — Ambulatory Visit (INDEPENDENT_AMBULATORY_CARE_PROVIDER_SITE_OTHER): Payer: BC Managed Care – PPO | Admitting: Family Medicine

## 2013-05-20 ENCOUNTER — Telehealth: Payer: Self-pay | Admitting: Nurse Practitioner

## 2013-05-20 VITALS — BP 149/79 | HR 100 | Temp 97.9°F | Ht 68.0 in | Wt 175.4 lb

## 2013-05-20 DIAGNOSIS — L282 Other prurigo: Secondary | ICD-10-CM

## 2013-05-20 DIAGNOSIS — R1110 Vomiting, unspecified: Secondary | ICD-10-CM

## 2013-05-20 DIAGNOSIS — R509 Fever, unspecified: Secondary | ICD-10-CM

## 2013-05-20 DIAGNOSIS — B9789 Other viral agents as the cause of diseases classified elsewhere: Secondary | ICD-10-CM

## 2013-05-20 DIAGNOSIS — R197 Diarrhea, unspecified: Secondary | ICD-10-CM

## 2013-05-20 DIAGNOSIS — B349 Viral infection, unspecified: Secondary | ICD-10-CM

## 2013-05-20 LAB — POCT CBC
Granulocyte percent: 61.6 %G (ref 37–80)
HCT, POC: 33.5 % — AB (ref 37.7–47.9)
Hemoglobin: 11.3 g/dL — AB (ref 12.2–16.2)
Lymph, poc: 2.3 (ref 0.6–3.4)
MCH, POC: 32 pg — AB (ref 27–31.2)
MCHC: 33.8 g/dL (ref 31.8–35.4)
MCV: 94.8 fL (ref 80–97)
MPV: 7.4 fL (ref 0–99.8)
POC Granulocyte: 4.3 (ref 2–6.9)
POC LYMPH PERCENT: 32.3 %L (ref 10–50)
Platelet Count, POC: 184 10*3/uL (ref 142–424)
RBC: 3.5 M/uL — AB (ref 4.04–5.48)
RDW, POC: 13.1 %
WBC: 7 10*3/uL (ref 4.6–10.2)

## 2013-05-20 NOTE — Addendum Note (Signed)
Addended by: Bearl Mulberry on: 05/20/2013 07:22 PM   Modules accepted: Orders

## 2013-05-20 NOTE — Telephone Encounter (Signed)
APPT MADE

## 2013-05-20 NOTE — Patient Instructions (Signed)
Clear liquids for 24 hours (like 7-Up, ginger ale, Sprite, Jello, frozen pops) Full liquids the second 24-hours (like potato soup, tomato soup, chicken noodle soup) Bland diet the third 24-hours (boiled and baked foods, no fried or greasy foods) Avoid milk, cheese, ice cream and dairy products for 72 hours. Avoid caffeine (cola drinks, coffee, tea, Mountain Dew, Mellow Yellow) Take in small amounts, but frequently. Tylenol and/or Advil as needed for aches pains and fever  

## 2013-05-20 NOTE — Progress Notes (Addendum)
Subjective:    Patient ID: [name], female    DOB: [birth year], 67 y.o.   MRN: [id number]  HPI Patient presents today with her son with a history of nausea vomiting and diarrhea for 1-1/2 weeks. Up until today she was having as many as 6 stools a day. Today she's only had one stool. She's only vomited one time today. She denies fever. She has not been around anyone else specifically that has been sick. She was recently treated by her gynecologist for a urinary tract infection with antibiotics and this was about 4 weeks ago. She does not remember what the name of the antibiotic was. Until all this happened she was having 1 formed bowel movement a day. She also complains of a persistent dermatitis that is itchy and excoriating for several weeks unresponsive to treatment from this office and from seeing a local dermatologist. She still admits that she is drinking some alcohol daily.   Review of Systems  Constitutional: Positive for fatigue. Negative for fever.  Gastrointestinal: Positive for vomiting, abdominal pain (some cramping) and diarrhea.  Neurological: Positive for weakness and headaches. Negative for dizziness.   excoriated dermatitis of her trunk and upper legs.     Objective:   Physical Exam  Nursing note and vitals reviewed. Constitutional: She is oriented to person, place, and time. No distress.  Somewhat sick and fatigued looking  HENT:  Head: Normocephalic and atraumatic.  Eyes: Conjunctivae are normal. Right eye exhibits no discharge. Left eye exhibits no discharge. No scleral icterus.  Neck: Normal range of motion. Neck supple.  Cardiovascular: Normal rate, regular rhythm and normal heart sounds.  Exam reveals no gallop.   No murmur heard. At 96 per minute  Pulmonary/Chest: Effort normal and breath sounds normal. No respiratory distress. She has no wheezes. She has no rales.  Abdominal: Soft. She exhibits no mass. There is tenderness (generalized). There is no rebound  and no guarding.  Increased bowel sounds  Musculoskeletal: She exhibits no edema.  Neurological: She is alert and oriented to person, place, and time.  Skin: Skin is dry. Rash (profuse excoriating rash) noted. She is not diaphoretic.  Psychiatric: She has a normal mood and affect. Her behavior is normal. Judgment and thought content normal.   Results for orders placed in visit on 05/20/13  POCT CBC      Result Value Range   WBC 7.0  4.6 - 10.2 K/uL   Lymph, poc 2.3  0.6 - 3.4   POC LYMPH PERCENT 32.3  10 - 50 %L   POC Granulocyte 4.3  2 - 6.9   Granulocyte percent 61.6  37 - 80 %G   RBC 3.5 (*) 4.04 - 5.48 M/uL   Hemoglobin 11.3 (*) 12.2 - 16.2 g/dL   HCT, POC 78.2 (*) 95.6 - 47.9 %   MCV 94.8  80 - 97 fL   MCH, POC 32.0 (*) 27 - 31.2 pg   MCHC 33.8  31.8 - 35.4 g/dL   RDW, POC 21.3     Platelet Count, POC 184.0  142 - 424 K/uL   MPV 7.4  0 - 99.8 fL          Assessment & Plan:  1. Diarrhea - POCT CBC - BASIC METABOLIC PANEL WITH GFR  2. Vomiting - POCT CBC - BASIC METABOLIC PANEL WITH GFR  3. Fever - POCT CBC - BASIC METABOLIC PANEL WITH GFR  4. Pruritic rash -LFTs  5. Viral syndrome  Patient Instructions  Clear  liquids for 24 hours (like 7-Up, ginger ale, Sprite, Jello, frozen pops) Full liquids the second 24-hours (like potato soup, tomato soup, chicken noodle soup) Bland diet the third 24-hours (boiled and baked foods, no fried or greasy foods) Avoid milk, cheese, ice cream and dairy products for 72 hours. Avoid caffeine (cola drinks, coffee, tea, Mountain Dew, Mellow Yellow) Take in small amounts, but frequently. Tylenol and/or Advil as needed for aches pains and fever    We'll do referral to [facility] dermatology Will consider doing stool culture and sensitivity, stool for C. Difficile Should remain ou of her work at least through next Monday.

## 2013-05-28 NOTE — Progress Notes (Signed)
Pt aware of labs  

## 2013-06-04 ENCOUNTER — Telehealth: Payer: Self-pay | Admitting: Nurse Practitioner

## 2013-06-05 NOTE — Telephone Encounter (Signed)
LMTCB What med is she requesting

## 2013-06-06 ENCOUNTER — Other Ambulatory Visit: Payer: Self-pay | Admitting: *Deleted

## 2013-06-06 MED ORDER — LEVOTHYROXINE SODIUM 100 MCG PO TABS: 100.0000 ug | ORAL_TABLET | Freq: Every day | ORAL | Status: DC

## 2013-06-20 ENCOUNTER — Ambulatory Visit (INDEPENDENT_AMBULATORY_CARE_PROVIDER_SITE_OTHER): Payer: BC Managed Care – PPO | Admitting: Nurse Practitioner

## 2013-06-20 ENCOUNTER — Encounter: Payer: Self-pay | Admitting: Nurse Practitioner

## 2013-06-20 VITALS — BP 153/76 | HR 87 | Temp 97.9°F | Ht 68.0 in | Wt 179.0 lb

## 2013-06-20 DIAGNOSIS — L259 Unspecified contact dermatitis, unspecified cause: Secondary | ICD-10-CM

## 2013-06-20 DIAGNOSIS — E039 Hypothyroidism, unspecified: Secondary | ICD-10-CM

## 2013-06-20 DIAGNOSIS — F329 Major depressive disorder, single episode, unspecified: Secondary | ICD-10-CM

## 2013-06-20 DIAGNOSIS — L309 Dermatitis, unspecified: Secondary | ICD-10-CM

## 2013-06-20 DIAGNOSIS — F32A Depression, unspecified: Secondary | ICD-10-CM

## 2013-06-20 DIAGNOSIS — E538 Deficiency of other specified B group vitamins: Secondary | ICD-10-CM

## 2013-06-20 MED ORDER — CYANOCOBALAMIN 1000 MCG/ML IJ SOLN
1000.0000 ug | INTRAMUSCULAR | Status: DC
  Administered 2013-06-20 – 2013-10-01 (×3): 1000 ug via INTRAMUSCULAR

## 2013-06-20 MED ORDER — DULOXETINE HCL 30 MG PO CPEP: 30.0000 mg | ORAL_CAPSULE | Freq: Every day | ORAL | Status: DC

## 2013-06-20 NOTE — Progress Notes (Signed)
  Subjective:    Patient ID: [name], female    DOB: [birth year], 67 y.o.   MRN: [id number]  HPI1. patient in today for recheck of Hypothyroidism- needs refill of meds- doing ok- no compliants today 2. Rash on bil arms and legs- started out on back of nesck an dhas spread to arms an dlower legs- very itchy. Nothing in web spaces.   Review of Systems  Constitutional: Positive for fatigue.  Skin: Positive for rash (all over).       Objective:   Physical Exam  Constitutional: She is oriented to person, place, and time. She appears well-developed and well-nourished.  Cardiovascular: Normal rate and normal heart sounds.   Pulmonary/Chest: Effort normal and breath sounds normal.  Neurological: She is alert and oriented to person, place, and time.  Skin:  Erythematous maculo-papular rash with scabbed over areas on bil arms and back and lower legs.- patient constantly scratching at area.  Psychiatric: She has a normal mood and affect. Her behavior is normal. Judgment and thought content normal.     BP 153/76  Pulse 87  Temp(Src) 97.9 F (36.6 C) (Oral)  Ht 5\' 8"  (1.727 m)  Wt 179 lb (81.194 kg)  BMI 27.22 kg/m2      Assessment & Plan:   1. Hypothyroidism   2. Depression   3. Dermatitis    Orders Placed This Encounter  Procedures  . Thyroid Panel With TSH   Start back on cymbalta 30 mg QD- will increase to 60mg  Qd in 1 month Avoid scratching rash- Continue meds as rx by Dr. Orvan Falconer RTO in 1 month  Mary-Margaret Daphine Deutscher, FNP

## 2013-06-20 NOTE — Patient Instructions (Signed)

## 2013-06-21 LAB — THYROID PANEL WITH TSH
Free Thyroxine Index: 5.4 — ABNORMAL HIGH (ref 1.0–3.9)
T3 Uptake: 28.1 % (ref 22.5–37.0)
T4, Total: 19.1 ug/dL — ABNORMAL HIGH (ref 5.0–12.5)
TSH: 0.356 u[IU]/mL (ref 0.350–4.500)

## 2013-06-24 NOTE — Progress Notes (Signed)
Pt aware of labs  

## 2013-06-29 ENCOUNTER — Other Ambulatory Visit: Payer: Self-pay | Admitting: Nurse Practitioner

## 2013-07-25 ENCOUNTER — Encounter: Payer: Self-pay | Admitting: Nurse Practitioner

## 2013-07-25 ENCOUNTER — Ambulatory Visit (INDEPENDENT_AMBULATORY_CARE_PROVIDER_SITE_OTHER): Payer: BC Managed Care – PPO | Admitting: Nurse Practitioner

## 2013-07-25 VITALS — BP 172/85 | HR 90 | Temp 97.0°F | Ht 68.0 in | Wt 178.0 lb

## 2013-07-25 DIAGNOSIS — F32A Depression, unspecified: Secondary | ICD-10-CM

## 2013-07-25 DIAGNOSIS — F3289 Other specified depressive episodes: Secondary | ICD-10-CM

## 2013-07-25 DIAGNOSIS — F329 Major depressive disorder, single episode, unspecified: Secondary | ICD-10-CM

## 2013-07-25 DIAGNOSIS — L2089 Other atopic dermatitis: Secondary | ICD-10-CM

## 2013-07-25 DIAGNOSIS — L209 Atopic dermatitis, unspecified: Secondary | ICD-10-CM

## 2013-07-25 DIAGNOSIS — E538 Deficiency of other specified B group vitamins: Secondary | ICD-10-CM

## 2013-07-25 MED ORDER — HYDROXYZINE HCL 25 MG PO TABS: 25.0000 mg | ORAL_TABLET | Freq: Every day | ORAL | Status: DC

## 2013-07-25 MED ORDER — DULOXETINE HCL 60 MG PO CPEP: 60.0000 mg | ORAL_CAPSULE | Freq: Every day | ORAL | Status: DC

## 2013-07-25 MED ORDER — PREDNISONE 20 MG PO TABS: ORAL_TABLET | ORAL | Status: DC

## 2013-07-25 MED ORDER — CLOTRIMAZOLE-BETAMETHASONE 1-0.05 % EX CREA: TOPICAL_CREAM | Freq: Two times a day (BID) | CUTANEOUS | Status: DC

## 2013-07-25 NOTE — Progress Notes (Signed)
  Subjective:    Patient ID: Jakaylah Schlafer, female    DOB: 08-Jun-1946, 67 y.o.   MRN: [id number]  HPI Patient in today for follow up- she was seen 1 month ago with a rash on arms- had seen Dr. Orvan Falconer and was told to just use what he had rx for one month and see if gets better- No better- very itchy. We also put her back on cymbalta 30 at last visit and she is here to discuss increasing. Patient doing much better since starting back on Cymbalta- wants to try increasing dose.    Review of Systems  All other systems reviewed and are negative.       Objective:   Physical Exam  Constitutional: She appears well-developed and well-nourished.  Cardiovascular: Normal rate and normal heart sounds.   Pulmonary/Chest: Effort normal and breath sounds normal.  Skin:  erythemaous open sore covering bil forearms  Psychiatric: She has a normal mood and affect. Her behavior is normal. Judgment and thought content normal.   BP 172/85  Pulse 90  Temp(Src) 97 F (36.1 C) (Oral)  Ht 5\' 8"  (1.727 m)  Wt 178 lb (80.74 kg)  BMI 27.07 kg/m2        Assessment & Plan:  1. Atopic dermatitis Keep appointment with dermatologist - hydrOXYzine (ATARAX/VISTARIL) 25 MG tablet; Take 1 tablet (25 mg total) by mouth at bedtime.  Dispense: 30 tablet; Refill: 0 - predniSONE (DELTASONE) 20 MG tablet; 2 po at same time daily for 5 days  Dispense: 10 tablet; Refill: 0  2. Depression Stress management - DULoxetine (CYMBALTA) 60 MG capsule; Take 1 capsule (60 mg total) by mouth daily.  Dispense: 30 capsule; Refill: 3  Mary-Margaret Daphine Deutscher, FNP

## 2013-07-25 NOTE — Addendum Note (Signed)
Addended by: Bennie Pierini on: 07/25/2013 12:03 PM   Modules accepted: Orders

## 2013-07-25 NOTE — Patient Instructions (Signed)

## 2013-07-29 ENCOUNTER — Other Ambulatory Visit: Payer: Self-pay

## 2013-07-29 MED ORDER — FUROSEMIDE 40 MG PO TABS: 40.0000 mg | ORAL_TABLET | Freq: Every day | ORAL | Status: DC

## 2013-08-26 ENCOUNTER — Telehealth: Payer: Self-pay | Admitting: Nurse Practitioner

## 2013-08-26 NOTE — Telephone Encounter (Signed)
Pt referred to derm Derm rx'd prescription and it is making pt very sleepy and nauseated Referred pt back to presciber for adjustment Pt will call back if she can't get help from derm

## 2013-09-01 ENCOUNTER — Telehealth: Payer: Self-pay | Admitting: Nurse Practitioner

## 2013-09-01 NOTE — Telephone Encounter (Signed)
Should be fine if only taking for a few days

## 2013-09-01 NOTE — Telephone Encounter (Signed)
Patient aware.

## 2013-10-01 ENCOUNTER — Ambulatory Visit (INDEPENDENT_AMBULATORY_CARE_PROVIDER_SITE_OTHER): Payer: BC Managed Care – PPO | Admitting: *Deleted

## 2013-10-01 ENCOUNTER — Ambulatory Visit: Payer: BC Managed Care – PPO | Admitting: *Deleted

## 2013-10-01 DIAGNOSIS — Z23 Encounter for immunization: Secondary | ICD-10-CM

## 2013-10-01 DIAGNOSIS — E538 Deficiency of other specified B group vitamins: Secondary | ICD-10-CM

## 2013-10-01 NOTE — Progress Notes (Signed)
B12 AND FLUARIX GIVEN PT TOLERATED WELL

## 2013-10-10 ENCOUNTER — Encounter: Payer: Self-pay | Admitting: Family Medicine

## 2013-10-10 ENCOUNTER — Ambulatory Visit (INDEPENDENT_AMBULATORY_CARE_PROVIDER_SITE_OTHER): Payer: BC Managed Care – PPO | Admitting: Family Medicine

## 2013-10-10 ENCOUNTER — Telehealth: Payer: Self-pay | Admitting: Nurse Practitioner

## 2013-10-10 VITALS — BP 154/87 | HR 99 | Temp 97.2°F | Wt 184.2 lb

## 2013-10-10 DIAGNOSIS — M109 Gout, unspecified: Secondary | ICD-10-CM

## 2013-10-10 MED ORDER — INDOMETHACIN ER 75 MG PO CPCR: 75.0000 mg | ORAL_CAPSULE | Freq: Two times a day (BID) | ORAL | Status: DC

## 2013-10-10 NOTE — Progress Notes (Signed)
  Subjective:    Patient ID: Noelie Renfrow, female    DOB: Jun 25, 1946, 67 y.o.   MRN: [id number]  HPI This 67 y.o. female presents for evaluation of gout.  She is having Left knee and foot pain.  She was taking Indomethacin and this helped.   Review of Systems C/o arthralgias in left knee and foot   No chest pain, SOB, HA, dizziness, vision change, N/V, diarrhea, constipation, dysuria, urinary urgency or frequencyor rash.  Objective:   Physical Exam  Vital signs noted  Well developed well nourished female.  HEENT - Head atraumatic Normocephalic                Eyes - PERRLA, Conjuctiva - clear Sclera- Clear EOMI                Ears - EAC's Wnl TM's Wnl Gross Hearing WNL                Nose - Nares patent                 Throat - oropharanx wnl Respiratory - Lungs CTA bilateral CCardiac - RRR S1 and S2 without murmur GI - Abdomen soft Nontender and bowel sounds active x 4 Extremities - No edema. Neuro - Grossly intact. MS - TTP left knee and ankle     Assessment & Plan:  Gout - Plan: Uric acid, indomethacin (INDOCIN SR) 75 MG CR capsule Discussed eating a low purine diet and if uric acid is elevated will order allopurinol  Deatra Canter FNP

## 2013-10-10 NOTE — Telephone Encounter (Signed)
Pt wants to be seen for gout appt scheduled

## 2013-10-10 NOTE — Patient Instructions (Signed)

## 2013-10-11 LAB — URIC ACID: Uric Acid: 10.4 mg/dL — ABNORMAL HIGH (ref 2.5–7.1)

## 2013-10-13 ENCOUNTER — Other Ambulatory Visit: Payer: Self-pay | Admitting: Family Medicine

## 2013-10-13 DIAGNOSIS — M109 Gout, unspecified: Secondary | ICD-10-CM

## 2013-10-13 MED ORDER — ALLOPURINOL 100 MG PO TABS: 100.0000 mg | ORAL_TABLET | Freq: Every day | ORAL | Status: DC

## 2013-10-21 ENCOUNTER — Encounter (HOSPITAL_COMMUNITY): Payer: Self-pay | Admitting: Emergency Medicine

## 2013-10-21 ENCOUNTER — Observation Stay (HOSPITAL_COMMUNITY)
Admission: EM | Admit: 2013-10-21 | Discharge: 2013-10-22 | Disposition: A | Discharge status: Home / Self Care | Payer: BC Managed Care – PPO | Attending: Internal Medicine | Admitting: Internal Medicine

## 2013-10-21 ENCOUNTER — Emergency Department (HOSPITAL_COMMUNITY): Payer: BC Managed Care – PPO

## 2013-10-21 ENCOUNTER — Ambulatory Visit (INDEPENDENT_AMBULATORY_CARE_PROVIDER_SITE_OTHER): Payer: BC Managed Care – PPO | Admitting: Family Medicine

## 2013-10-21 ENCOUNTER — Encounter: Payer: Self-pay | Admitting: Family Medicine

## 2013-10-21 VITALS — BP 192/103 | HR 72 | Temp 97.0°F | Ht 68.0 in | Wt 178.0 lb

## 2013-10-21 DIAGNOSIS — R6889 Other general symptoms and signs: Secondary | ICD-10-CM | POA: Diagnosis not present

## 2013-10-21 DIAGNOSIS — R51 Headache: Secondary | ICD-10-CM | POA: Diagnosis not present

## 2013-10-21 DIAGNOSIS — D649 Anemia, unspecified: Secondary | ICD-10-CM | POA: Diagnosis present

## 2013-10-21 DIAGNOSIS — R4189 Other symptoms and signs involving cognitive functions and awareness: Secondary | ICD-10-CM

## 2013-10-21 DIAGNOSIS — E039 Hypothyroidism, unspecified: Secondary | ICD-10-CM | POA: Insufficient documentation

## 2013-10-21 DIAGNOSIS — Z7982 Long term (current) use of aspirin: Secondary | ICD-10-CM | POA: Insufficient documentation

## 2013-10-21 DIAGNOSIS — E876 Hypokalemia: Secondary | ICD-10-CM

## 2013-10-21 DIAGNOSIS — F1020 Alcohol dependence, uncomplicated: Secondary | ICD-10-CM

## 2013-10-21 DIAGNOSIS — I10 Essential (primary) hypertension: Secondary | ICD-10-CM

## 2013-10-21 DIAGNOSIS — R471 Dysarthria and anarthria: Secondary | ICD-10-CM

## 2013-10-21 DIAGNOSIS — E785 Hyperlipidemia, unspecified: Secondary | ICD-10-CM | POA: Insufficient documentation

## 2013-10-21 DIAGNOSIS — E861 Hypovolemia: Secondary | ICD-10-CM

## 2013-10-21 DIAGNOSIS — E871 Hypo-osmolality and hyponatremia: Secondary | ICD-10-CM

## 2013-10-21 DIAGNOSIS — F1010 Alcohol abuse, uncomplicated: Secondary | ICD-10-CM

## 2013-10-21 DIAGNOSIS — N179 Acute kidney failure, unspecified: Secondary | ICD-10-CM

## 2013-10-21 DIAGNOSIS — J4489 Other specified chronic obstructive pulmonary disease: Secondary | ICD-10-CM | POA: Insufficient documentation

## 2013-10-21 DIAGNOSIS — Z8673 Personal history of transient ischemic attack (TIA), and cerebral infarction without residual deficits: Secondary | ICD-10-CM

## 2013-10-21 DIAGNOSIS — J449 Chronic obstructive pulmonary disease, unspecified: Secondary | ICD-10-CM | POA: Insufficient documentation

## 2013-10-21 DIAGNOSIS — E872 Acidosis: Secondary | ICD-10-CM

## 2013-10-21 DIAGNOSIS — F09 Unspecified mental disorder due to known physiological condition: Secondary | ICD-10-CM

## 2013-10-21 DIAGNOSIS — I951 Orthostatic hypotension: Secondary | ICD-10-CM

## 2013-10-21 DIAGNOSIS — N182 Chronic kidney disease, stage 2 (mild): Secondary | ICD-10-CM | POA: Insufficient documentation

## 2013-10-21 DIAGNOSIS — E538 Deficiency of other specified B group vitamins: Secondary | ICD-10-CM | POA: Insufficient documentation

## 2013-10-21 DIAGNOSIS — I129 Hypertensive chronic kidney disease with stage 1 through stage 4 chronic kidney disease, or unspecified chronic kidney disease: Secondary | ICD-10-CM | POA: Insufficient documentation

## 2013-10-21 DIAGNOSIS — G459 Transient cerebral ischemic attack, unspecified: Principal | ICD-10-CM | POA: Insufficient documentation

## 2013-10-21 LAB — CBC WITH DIFFERENTIAL/PLATELET
Basophils Absolute: 0 10*3/uL (ref 0.0–0.1)
Basophils Relative: 0 % (ref 0–1)
Eosinophils Absolute: 0.3 10*3/uL (ref 0.0–0.7)
Eosinophils Relative: 4 % (ref 0–5)
HCT: 35.2 % — ABNORMAL LOW (ref 36.0–46.0)
Hemoglobin: 12.5 g/dL (ref 12.0–15.0)
Lymphocytes Relative: 23 % (ref 12–46)
Lymphs Abs: 1.7 10*3/uL (ref 0.7–4.0)
MCH: 32.6 pg (ref 26.0–34.0)
MCHC: 35.5 g/dL (ref 30.0–36.0)
MCV: 91.9 fL (ref 78.0–100.0)
Monocytes Absolute: 0.5 10*3/uL (ref 0.1–1.0)
Monocytes Relative: 7 % (ref 3–12)
Neutro Abs: 5.1 10*3/uL (ref 1.7–7.7)
Neutrophils Relative %: 67 % (ref 43–77)
Platelets: 205 10*3/uL (ref 150–400)
RBC: 3.83 MIL/uL — ABNORMAL LOW (ref 3.87–5.11)
RDW: 12.1 % (ref 11.5–15.5)
WBC: 7.7 10*3/uL (ref 4.0–10.5)

## 2013-10-21 LAB — BASIC METABOLIC PANEL
BUN: 15 mg/dL (ref 6–23)
CO2: 26 mEq/L (ref 19–32)
Calcium: 8.7 mg/dL (ref 8.4–10.5)
Chloride: 99 mEq/L (ref 96–112)
Creatinine, Ser: 1.12 mg/dL — ABNORMAL HIGH (ref 0.50–1.10)
GFR calc Af Amer: 58 mL/min — ABNORMAL LOW (ref >90)
GFR calc non Af Amer: 50 mL/min — ABNORMAL LOW (ref >90)
Glucose, Bld: 97 mg/dL (ref 70–99)
Potassium: 3.9 mEq/L (ref 3.5–5.1)
Sodium: 134 mEq/L — ABNORMAL LOW (ref 135–145)

## 2013-10-21 MED ORDER — ASPIRIN 325 MG PO TABS
325.0000 mg | ORAL_TABLET | Freq: Every day | ORAL | Status: DC
  Administered 2013-10-21 – 2013-10-22 (×2): 325 mg via ORAL
  Filled 2013-10-21 (×2): qty 1

## 2013-10-21 MED ORDER — FUROSEMIDE 40 MG PO TABS
40.0000 mg | ORAL_TABLET | Freq: Every day | ORAL | Status: DC
  Administered 2013-10-22: 40 mg via ORAL
  Filled 2013-10-21: qty 1

## 2013-10-21 MED ORDER — TIOTROPIUM BROMIDE MONOHYDRATE 18 MCG IN CAPS
18.0000 ug | ORAL_CAPSULE | Freq: Every day | RESPIRATORY_TRACT | Status: DC
  Filled 2013-10-21: qty 5

## 2013-10-21 MED ORDER — HYDRALAZINE HCL 25 MG PO TABS
25.0000 mg | ORAL_TABLET | Freq: Four times a day (QID) | ORAL | Status: DC | PRN
  Administered 2013-10-22: 25 mg via ORAL
  Filled 2013-10-21 (×2): qty 1

## 2013-10-21 MED ORDER — ALLOPURINOL 100 MG PO TABS
200.0000 mg | ORAL_TABLET | Freq: Every day | ORAL | Status: DC
  Administered 2013-10-22: 200 mg via ORAL
  Filled 2013-10-21: qty 2

## 2013-10-21 MED ORDER — ASPIRIN 300 MG RE SUPP
300.0000 mg | Freq: Every day | RECTAL | Status: DC
  Filled 2013-10-21 (×2): qty 1

## 2013-10-21 MED ORDER — MEDROXYPROGESTERONE ACETATE 2.5 MG PO TABS
2.5000 mg | ORAL_TABLET | Freq: Every day | ORAL | Status: DC
  Administered 2013-10-22: 2.5 mg via ORAL
  Filled 2013-10-21: qty 1

## 2013-10-21 MED ORDER — HYDROXYZINE HCL 25 MG PO TABS
25.0000 mg | ORAL_TABLET | Freq: Every day | ORAL | Status: DC
  Administered 2013-10-21: 25 mg via ORAL
  Filled 2013-10-21 (×3): qty 1

## 2013-10-21 MED ORDER — ESTROGENS CONJUGATED 1.25 MG PO TABS
1.2500 mg | ORAL_TABLET | Freq: Every day | ORAL | Status: DC
  Administered 2013-10-22: 1.25 mg via ORAL
  Filled 2013-10-21: qty 1

## 2013-10-21 MED ORDER — BUDESONIDE-FORMOTEROL FUMARATE 160-4.5 MCG/ACT IN AERO
2.0000 | INHALATION_SPRAY | Freq: Two times a day (BID) | RESPIRATORY_TRACT | Status: DC
  Filled 2013-10-21: qty 6

## 2013-10-21 MED ORDER — ENOXAPARIN SODIUM 40 MG/0.4ML ~~LOC~~ SOLN
40.0000 mg | SUBCUTANEOUS | Status: DC
  Administered 2013-10-21: 40 mg via SUBCUTANEOUS
  Filled 2013-10-21 (×2): qty 0.4

## 2013-10-21 MED ORDER — ACLIDINIUM BROMIDE 400 MCG/ACT IN AEPB: 1.0000 | INHALATION_SPRAY | Freq: Two times a day (BID) | RESPIRATORY_TRACT | Status: DC

## 2013-10-21 MED ORDER — LEVOTHYROXINE SODIUM 125 MCG PO TABS
125.0000 ug | ORAL_TABLET | Freq: Every day | ORAL | Status: DC
  Administered 2013-10-22: 125 ug via ORAL
  Filled 2013-10-21 (×2): qty 1

## 2013-10-21 MED ORDER — IRBESARTAN 150 MG PO TABS
150.0000 mg | ORAL_TABLET | Freq: Every day | ORAL | Status: DC
  Administered 2013-10-22: 150 mg via ORAL
  Filled 2013-10-21: qty 1

## 2013-10-21 MED ORDER — TRAMADOL HCL 50 MG PO TABS: 50.0000 mg | ORAL_TABLET | Freq: Three times a day (TID) | ORAL | Status: DC | PRN

## 2013-10-21 MED ORDER — LORATADINE 10 MG PO TABS
10.0000 mg | ORAL_TABLET | Freq: Every day | ORAL | Status: DC
  Administered 2013-10-21 – 2013-10-22 (×2): 10 mg via ORAL
  Filled 2013-10-21 (×2): qty 1

## 2013-10-21 MED ORDER — CYANOCOBALAMIN 1000 MCG/ML IJ SOLN: 1000.0000 ug | INTRAMUSCULAR | Status: DC

## 2013-10-21 MED ORDER — METOPROLOL TARTRATE 25 MG PO TABS
25.0000 mg | ORAL_TABLET | Freq: Every day | ORAL | Status: DC
  Administered 2013-10-22: 25 mg via ORAL
  Filled 2013-10-21: qty 1

## 2013-10-21 MED ORDER — DULOXETINE HCL 60 MG PO CPEP
60.0000 mg | ORAL_CAPSULE | Freq: Every day | ORAL | Status: DC
  Administered 2013-10-22: 60 mg via ORAL
  Filled 2013-10-21: qty 1

## 2013-10-21 NOTE — ED Notes (Signed)
Pt brought to ED by EMS with speech difficulty for few minutes at work.As per EMS pt said she was talking  Gibrish(word salad).No other symptoms reported.Pt went to the doctors office after the event and was referred from doctors office to [place].

## 2013-10-21 NOTE — ED Notes (Signed)
Pt in MRI.

## 2013-10-21 NOTE — Patient Instructions (Addendum)
             Stroke Prevention Some medical conditions and behaviors are associated with an increased chance of having a stroke. You may prevent a stroke by making healthy choices and managing medical conditions. Reduce your risk of having a stroke by:  Staying physically active. Get at least 30 minutes of activity on most or all days.  Not smoking. It may also be helpful to avoid exposure to secondhand smoke.  Limiting alcohol use. Moderate alcohol use is considered to be:  No more than 2 drinks per day for men.  No more than 1 drink per day for nonpregnant women.  Eating healthy foods.  Include 5 or more servings of fruits and vegetables a day.  Certain diets may be prescribed to address high blood pressure, high cholesterol, diabetes, or obesity.  Managing your cholesterol levels.  A low-saturated fat, low-trans fat, low-cholesterol, and high-fiber diet may control cholesterol levels.  Take any prescribed medicines to control cholesterol as directed by your caregiver.  Managing your diabetes.  A controlled-carbohydrate, controlled-sugar diet is recommended to manage diabetes.  Take any prescribed medicines to control diabetes as directed by your caregiver.  Controlling your high blood pressure (hypertension).  A low-salt (sodium), low-saturated fat, low-trans fat, and low-cholesterol diet is recommended to manage high blood pressure.  Take any prescribed medicines to control hypertension as directed by your caregiver.  Maintaining a healthy weight.  A reduced-calorie, low-sodium, low-saturated fat, low-trans fat, low-cholesterol diet is recommended to manage weight.  Stopping drug abuse.  Avoiding birth control pills.  Talk to your caregiver about the risks of taking birth control pills if you are over 58 years old, smoke, get migraines, or have ever had a blood clot.  Getting evaluated for sleep disorders (sleep apnea).  Talk to your caregiver about  getting a sleep evaluation if you snore a lot or have excessive sleepiness.  Taking medicines as directed by your caregiver.  For some people, aspirin or blood thinners (anticoagulants) are helpful in reducing the risk of forming abnormal blood clots that can lead to stroke. If you have the irregular heart rhythm of atrial fibrillation, you should be on a blood thinner unless there is a good reason you cannot take them.  Understand all your medicine instructions. SEEK IMMEDIATE MEDICAL CARE IF:   You have sudden weakness or numbness of the face, arm, or leg, especially on one side of the body.  You have sudden confusion.  You have trouble speaking (aphasia) or understanding.  You have sudden trouble seeing in one or both eyes.  You have sudden trouble walking.  You have dizziness.  You have a loss of balance or coordination.  You have a sudden, severe headache with no known cause.  You have new chest pain or an irregular heartbeat. Any of these symptoms may represent a serious problem that is an emergency. Do not wait to see if the symptoms will go away. Get medical help right away. Call your local emergency services (911 in [place] Do not drive yourself to the hospital. Document Released: 01/04/2005 Document Revised: 02/19/2012 Document Reviewed: 05/30/2013 [facility] Patient Information 2014 [place], [place].   Patient understands she is being transported to the hospital for further evaluation with scans and seeing a specialist to make sure that she's not having a stroke The cardiology nurse at the hospital was called and notified of the patient's imminent transfer by EMS

## 2013-10-21 NOTE — Progress Notes (Signed)
Subjective:    Patient ID: [name], female    DOB: [birth year], 67 y.o.   MRN: [id number]  HPI Patient here today with complaint numbness from waist up and problems speaking for approximately 15 minutes today. Patient is accompanied today by her son. The son brought his mom here from her workplace. He indicates he she has been taking her medications regularly. He indicates that she is still drinking alcohol. He says in her home blood pressures have been running in the 170s over the 70s.   Patient Active Problem List   Diagnosis Date Noted  . Chronic kidney disease (CKD), stage II (mild) 10/03/2012  . Carotid artery narrowing 10/03/2012  . Orthostatic syncope 10/02/2012  . Hypokalemia 10/02/2012  . Hypothyroidism 10/02/2012  . Acute renal failure 01/09/2012  . HTN (hypertension) 01/09/2012  . Hyperlipidemia 01/09/2012  . TIA (transient ischemic attack) 01/09/2012  . Hypovolemia dehydration 01/09/2012  . Metabolic acidosis 01/09/2012  . Hyponatremia 01/09/2012  . Anemia 01/09/2012  . Alcoholism 01/09/2012  . B12 deficiency 01/09/2012    Current Outpatient Prescriptions on File Prior to Visit  Medication Sig Dispense Refill  . acetaminophen (TYLENOL) 500 MG tablet Take 500 mg by mouth every 6 (six) hours as needed. Pain      . Aclidinium Bromide (TUDORZA PRESSAIR) 400 MCG/ACT AEPB Inhale 1 puff into the lungs 2 (two) times daily.  1 each  3  . allopurinol (ZYLOPRIM) 100 MG tablet Take 1 tablet (100 mg total) by mouth daily.  30 tablet  6  . BEPREVE 1.5 % SOLN       . budesonide-formoterol (SYMBICORT) 160-4.5 MCG/ACT inhaler Inhale 2 puffs into the lungs 2 (two) times daily.      . cetirizine (ZYRTEC) 10 MG tablet Take 10 mg by mouth daily.      . clotrimazole-betamethasone (LOTRISONE) cream Apply topically 2 (two) times daily.  45 g  1  . cyanocobalamin (,VITAMIN B-12,) 1000 MCG/ML injection Inject 1,000 mcg into the muscle every 30 (thirty) days.      . DULoxetine (CYMBALTA)  60 MG capsule Take 1 capsule (60 mg total) by mouth daily.  30 capsule  3  . furosemide (LASIX) 40 MG tablet Take 1 tablet (40 mg total) by mouth daily.  30 tablet  5  . hydrOXYzine (ATARAX/VISTARIL) 25 MG tablet Take 1 tablet (25 mg total) by mouth at bedtime.  30 tablet  0  . indomethacin (INDOCIN SR) 75 MG CR capsule Take 1 capsule (75 mg total) by mouth 2 (two) times daily with a meal.  30 capsule  3  . levothyroxine (SYNTHROID, LEVOTHROID) 125 MCG tablet TAKE ONE TABLET BY MOUTH ONCE DAILY. NEEDS TO BE SEEN  30 tablet  11  . medroxyPROGESTERone (PROVERA) 2.5 MG tablet Take 2.5 mg by mouth daily.       . metoprolol tartrate (LOPRESSOR) 25 MG tablet Take 25 mg by mouth daily.       . predniSONE (DELTASONE) 20 MG tablet 2 po at same time daily for 5 days  10 tablet  0  . PREMARIN 1.25 MG tablet       . traMADol (ULTRAM) 50 MG tablet Take 50 mg by mouth every 8 (eight) hours as needed for pain.      Marland Kitchen triamcinolone cream (KENALOG) 0.1 % Apply topically 2 (two) times daily.      . valsartan (DIOVAN) 160 MG tablet Take 1 tablet (160 mg total) by mouth daily.  30 tablet  2  Current Facility-Administered Medications on File Prior to Visit  Medication Dose Route Frequency Provider Last Rate Last Dose  . cyanocobalamin ((VITAMIN B-12)) injection 1,000 mcg  1,000 mcg Intramuscular Q30 days Mary-Margaret Daphine Deutscher, FNP   1,000 mcg at 10/01/13 1004    Review of Systems  Constitutional: Negative.   Eyes: Negative.   Respiratory: Negative for shortness of breath.   Cardiovascular: Negative.  Negative for chest pain.  Gastrointestinal: Negative.   Endocrine: Negative.   Genitourinary: Negative.   Musculoskeletal: Negative.        Numbness from stomach area upward and unable to speak.  Skin: Negative.   Allergic/Immunologic: Negative.   Neurological: Positive for headaches (right temporal HA). Negative for dizziness.  Hematological: Negative.   Psychiatric/Behavioral: Negative.         Objective:   Physical Exam  Nursing note and vitals reviewed. Constitutional: She is oriented to person, place, and time. She appears well-developed and well-nourished. She appears distressed (somewhat distressed and flat affect).  HENT:  Head: Normocephalic and atraumatic.  Right Ear: External ear normal.  Left Ear: External ear normal.  Nose: Nose normal.  Mouth/Throat: Oropharynx is clear and moist.  Eyes: Conjunctivae and EOM are normal. Pupils are equal, round, and reactive to light. Right eye exhibits no discharge. Left eye exhibits no discharge. No scleral icterus.  Neck: Normal range of motion. Neck supple. No JVD present. No thyromegaly present.  Cardiovascular: Normal rate, regular rhythm and normal heart sounds.  Exam reveals no gallop and no friction rub.   No murmur heard. At 84 per minute  Pulmonary/Chest: Effort normal and breath sounds normal. No respiratory distress. She has no wheezes. She has no rales.  Abdominal: Soft. Bowel sounds are normal. She exhibits no mass. There is no tenderness. There is no rebound and no guarding.  Musculoskeletal: Normal range of motion. She exhibits no edema and no tenderness.  Lymphadenopathy:    She has no cervical adenopathy.  Neurological: She is alert and oriented to person, place, and time. She has normal reflexes. No cranial nerve deficit.  Skin: Skin is warm and dry.  Psychiatric: She has a normal mood and affect. Her behavior is normal. Judgment and thought content normal.   BP 188/98  Pulse 86  Temp(Src) 97 F (36.1 C) (Oral)  Ht 5\' 8"  (1.727 m)  Wt 178 lb (80.74 kg)  BMI 27.07 kg/m2  LMP 08/21/2013  An IV was started of normal saline by EMS Her pulse ox before the O2 was 100%      Assessment & Plan:   1. Cognitive deficits   2. Dysarthria   3. History of TIA (transient ischemic attack)   4. Hypertension   5. Alcohol abuse    O2 was ordered for patient and an IV of normal saline was started without  problem The hospital ER triage nurse was called and notified that the patient would be transferred by EMS She left the office in stable condition and appears to be back in a normal cognitive state  Patient Instructions              Stroke Prevention Some medical conditions and behaviors are associated with an increased chance of having a stroke. You may prevent a stroke by making healthy choices and managing medical conditions. Reduce your risk of having a stroke by:  Staying physically active. Get at least 30 minutes of activity on most or all days.  Not smoking. It may also be helpful to avoid exposure to  secondhand smoke.  Limiting alcohol use. Moderate alcohol use is considered to be:  No more than 2 drinks per day for men.  No more than 1 drink per day for nonpregnant women.  Eating healthy foods.  Include 5 or more servings of fruits and vegetables a day.  Certain diets may be prescribed to address high blood pressure, high cholesterol, diabetes, or obesity.  Managing your cholesterol levels.  A low-saturated fat, low-trans fat, low-cholesterol, and high-fiber diet may control cholesterol levels.  Take any prescribed medicines to control cholesterol as directed by your caregiver.  Managing your diabetes.  A controlled-carbohydrate, controlled-sugar diet is recommended to manage diabetes.  Take any prescribed medicines to control diabetes as directed by your caregiver.  Controlling your high blood pressure (hypertension).  A low-salt (sodium), low-saturated fat, low-trans fat, and low-cholesterol diet is recommended to manage high blood pressure.  Take any prescribed medicines to control hypertension as directed by your caregiver.  Maintaining a healthy weight.  A reduced-calorie, low-sodium, low-saturated fat, low-trans fat, low-cholesterol diet is recommended to manage weight.  Stopping drug abuse.  Avoiding birth control pills.  Talk to your  caregiver about the risks of taking birth control pills if you are over 88 years old, smoke, get migraines, or have ever had a blood clot.  Getting evaluated for sleep disorders (sleep apnea).  Talk to your caregiver about getting a sleep evaluation if you snore a lot or have excessive sleepiness.  Taking medicines as directed by your caregiver.  For some people, aspirin or blood thinners (anticoagulants) are helpful in reducing the risk of forming abnormal blood clots that can lead to stroke. If you have the irregular heart rhythm of atrial fibrillation, you should be on a blood thinner unless there is a good reason you cannot take them.  Understand all your medicine instructions. SEEK IMMEDIATE MEDICAL CARE IF:   You have sudden weakness or numbness of the face, arm, or leg, especially on one side of the body.  You have sudden confusion.  You have trouble speaking (aphasia) or understanding.  You have sudden trouble seeing in one or both eyes.  You have sudden trouble walking.  You have dizziness.  You have a loss of balance or coordination.  You have a sudden, severe headache with no known cause.  You have new chest pain or an irregular heartbeat. Any of these symptoms may represent a serious problem that is an emergency. Do not wait to see if the symptoms will go away. Get medical help right away. Call your local emergency services (911 in [place] Do not drive yourself to the hospital. Document Released: 01/04/2005 Document Revised: 02/19/2012 Document Reviewed: 05/30/2013 [facility] Patient Information 2014 [place], [place].   Patient understands she is being transported to the hospital for further evaluation with scans and seeing a specialist to make sure that she's not having a stroke The cardiology nurse at the hospital was called and notified of the patient's imminent transfer by EMS   Nyra Capes MD

## 2013-10-21 NOTE — ED Provider Notes (Signed)
CSN: [id number]     Arrival date & time 10/21/13  1411 History   First MD Initiated Contact with Patient 10/21/13 1425     Chief Complaint  Patient presents with  . Speech Problem   (Consider location/radiation/quality/duration/timing/severity/associated sxs/prior Treatment) HPI Patient with hx TIA 3 years ago presents with difficulty speaking that began approximately 11:00am and lasted 10-15 minutes.  States she could think of words but her speech came out "like Corporate investment banker."  States she had these exact symptoms 3 years ago.  Also had numbness down the front of her torso.  States this has all since resolved.  Denies other focal neurological deficits.  Denies CP, SOB.  States she is completely back to normal now.    Past Medical History  Diagnosis Date  . Hypertension   . Menopause   . Hypothyroid   . Mini stroke   . Hyperlipidemia   . B12 deficiency   . Shortness of breath   . Headache(784.0)   . Anxiety   . Pneumonia   . Anemia   . Depression   . Stroke 2010    no deficits  . Alcoholism 01/09/2012  . Hyponatremia 01/09/2012  . Chronic kidney disease (CKD), stage II (mild)   . COPD (chronic obstructive pulmonary disease)   . Orthostatic syncope 10/02/2012  . Carotid artery narrowing 10/03/2012    On the right.   Past Surgical History  Procedure Laterality Date  . Appendectomy    . Tonsillectomy    . Cataract extraction w/phaco  06/20/2012    Procedure: CATARACT EXTRACTION PHACO AND INTRAOCULAR LENS PLACEMENT (IOC);  Surgeon: Gemma Payor, MD;  Location: [facility];  Service: Ophthalmology;  Laterality: Right;  CDE:10.66  . Cataract extraction w/phaco  07/11/2012    Procedure: CATARACT EXTRACTION PHACO AND INTRAOCULAR LENS PLACEMENT (IOC);  Surgeon: Gemma Payor, MD;  Location: [facility];  Service: Ophthalmology;  Laterality: Left;  CDE: 12.69   No family history on file. History  Substance Use Topics  . Smoking status: Former Smoker -- 2.00 packs/day for 30 years   Types: Cigarettes    Quit date: 06/17/1992  . Smokeless tobacco: Not on file  . Alcohol Use: 1.0 oz/week    2 drink(s) per week   OB History   Grav Para Term Preterm Abortions TAB SAB Ect Mult Living                 Review of Systems  Constitutional: Negative for fever.  Respiratory: Negative for cough and shortness of breath.   Cardiovascular: Negative for chest pain.  Gastrointestinal: Negative for nausea, vomiting, abdominal pain and diarrhea.  Genitourinary: Negative for dysuria, urgency and frequency.  Neurological: Positive for speech difficulty and numbness. Negative for facial asymmetry and weakness.  All other systems reviewed and are negative.    Allergies  Norvasc and Procardia  Home Medications   Current Outpatient Rx  Name  Route  Sig  Dispense  Refill  . acetaminophen (TYLENOL) 500 MG tablet   Oral   Take 500 mg by mouth every 6 (six) hours as needed. Pain         . Aclidinium Bromide (TUDORZA PRESSAIR) 400 MCG/ACT AEPB   Inhalation   Inhale 1 puff into the lungs 2 (two) times daily.   1 each   3   . allopurinol (ZYLOPRIM) 100 MG tablet   Oral   Take 1 tablet (100 mg total) by mouth daily.   30 tablet   6   .  BEPREVE 1.5 % SOLN               . budesonide-formoterol (SYMBICORT) 160-4.5 MCG/ACT inhaler   Inhalation   Inhale 2 puffs into the lungs 2 (two) times daily.         . cetirizine (ZYRTEC) 10 MG tablet   Oral   Take 10 mg by mouth daily.         . clotrimazole-betamethasone (LOTRISONE) cream   Topical   Apply topically 2 (two) times daily.   45 g   1   . cyanocobalamin (,VITAMIN B-12,) 1000 MCG/ML injection   Intramuscular   Inject 1,000 mcg into the muscle every 30 (thirty) days.         . DULoxetine (CYMBALTA) 60 MG capsule   Oral   Take 1 capsule (60 mg total) by mouth daily.   30 capsule   3   . furosemide (LASIX) 40 MG tablet   Oral   Take 1 tablet (40 mg total) by mouth daily.   30 tablet   5   .  hydrOXYzine (ATARAX/VISTARIL) 25 MG tablet   Oral   Take 1 tablet (25 mg total) by mouth at bedtime.   30 tablet   0   . indomethacin (INDOCIN SR) 75 MG CR capsule   Oral   Take 1 capsule (75 mg total) by mouth 2 (two) times daily with a meal.   30 capsule   3   . levothyroxine (SYNTHROID, LEVOTHROID) 125 MCG tablet      TAKE ONE TABLET BY MOUTH ONCE DAILY. NEEDS TO BE SEEN   30 tablet   11   . medroxyPROGESTERone (PROVERA) 2.5 MG tablet   Oral   Take 2.5 mg by mouth daily.          . metoprolol tartrate (LOPRESSOR) 25 MG tablet   Oral   Take 25 mg by mouth daily.          . predniSONE (DELTASONE) 20 MG tablet      2 po at same time daily for 5 days   10 tablet   0   . PREMARIN 1.25 MG tablet               . traMADol (ULTRAM) 50 MG tablet   Oral   Take 50 mg by mouth every 8 (eight) hours as needed for pain.         Marland Kitchen triamcinolone cream (KENALOG) 0.1 %   Topical   Apply topically 2 (two) times daily.         . valsartan (DIOVAN) 160 MG tablet   Oral   Take 1 tablet (160 mg total) by mouth daily.   30 tablet   2    BP 197/105  Pulse 95  Temp(Src) 98.3 F (36.8 C) (Oral)  Ht 5\' 9"  (1.753 m)  Wt 173 lb (78.472 kg)  BMI 25.54 kg/m2  SpO2 100%  LMP 08/21/2013 Physical Exam  Nursing note and vitals reviewed. Constitutional: She appears well-developed and well-nourished. No distress.  HENT:  Head: Normocephalic and atraumatic.  Neck: Neck supple.  Cardiovascular: Normal rate and regular rhythm.   Pulmonary/Chest: Effort normal and breath sounds normal. No respiratory distress. She has no wheezes. She has no rales.  Abdominal: Soft. She exhibits no distension. There is no tenderness. There is no rebound and no guarding.  Neurological: She is alert. She has normal strength. No cranial nerve deficit or sensory deficit. She exhibits normal muscle tone. GCS  eye subscore is 4. GCS verbal subscore is 5. GCS motor subscore is 6.  CN II-XII intact,  EOMs intact, no pronator drift, grip strengths equal bilaterally; strength 5/5 in all extremities, sensation intact in all extremities; finger to nose, heel to shin, rapid alternating movements normal; gait is normal.     Skin: She is not diaphoretic.    ED Course  Procedures (including critical care time) Labs Review Labs Reviewed  CBC WITH DIFFERENTIAL - Abnormal; Notable for the following:    RBC 3.83 (*)    HCT 35.2 (*)    All other components within normal limits  BASIC METABOLIC PANEL - Abnormal; Notable for the following:    Sodium 134 (*)    Creatinine, Ser 1.12 (*)    GFR calc non Af Amer 50 (*)    GFR calc Af Amer 58 (*)    All other components within normal limits   Imaging Review Ct Head Wo Contrast  10/21/2013   CLINICAL DATA:  Difficulty with speech.  EXAM: CT HEAD WITHOUT CONTRAST  TECHNIQUE: Contiguous axial images were obtained from the base of the skull through the vertex without intravenous contrast.  COMPARISON:  Head CT scan 10/02/2012.  FINDINGS: Hypoattenuation in the subcortical and periventricular deep white matter is compatible with chronic microvascular ischemic change. There is no evidence of acute intracranial abnormality including infarction, hemorrhage, mass lesion, mass effect, midline shift or abnormal extra-axial fluid collection. No hydrocephalus or pneumocephalus. The calvarium is intact. Imaged paranasal sinuses and mastoid air cells are clear.  IMPRESSION: No acute finding. Chronic microvascular ischemic change.   Electronically Signed   By: Drusilla Kanner M.D.   On: 10/21/2013 15:00   Mr Brain Wo Contrast  10/21/2013   CLINICAL DATA:  Speech difficulties. No focal defects. Stroke risk factors include hypertension, and chronic renal disease.  EXAM: MRI HEAD WITHOUT CONTRAST  TECHNIQUE: Multiplanar, multiecho pulse sequences of the brain and surrounding structures were obtained without intravenous contrast.  COMPARISON:  CT head 10/21/2013.  FINDINGS:  No evidence for acute infarction, hemorrhage, mass lesion, hydrocephalus, or extra-axial fluid. There is mild premature for age cerebral and cerebellar atrophy. Moderate subcortical and periventricular T2 and FLAIR hyperintensities, likely chronic microvascular ischemic change. Pituitary, pineal, and cerebellar tonsils unremarkable. No upper cervical lesions. Visualized calvarium, skull base, and upper cervical osseous structures unremarkable. Scalp and extracranial soft tissues, orbits, sinuses, and mastoids show no acute process.  IMPRESSION: Mild atrophy. Moderate chronic microvascular ischemic change. No acute intracranial findings. Specifically no evidence for acute cerebral infarction or visible MR hemorrhage.   Electronically Signed   By: Davonna Belling M.D.   On: 10/21/2013 17:36    EKG Interpretation   None      Date: 10/21/2013  Rate: 89  Rhythm: normal sinus rhythm and premature ventricular contractions (PVC)  QRS Axis: normal  Intervals: normal  ST/T Wave abnormalities: nonspecific ST changes  Conduction Disutrbances:none  Narrative Interpretation:   Old EKG Reviewed: unchanged    MDM   1. TIA (transient ischemic attack)    Pt with 10-15 minutes of dysarthria that completely resolved.  No neuro deficits on exam.  TIA with same symptoms 3 years ago.  Not on ASA or any blood thinners.  Has hx HTN and hyperlipidemia.  Carotid artery study done 1 year ago with instructions for f/u one year later.  No echocardiogram found in EPIC search.  CT scan negative. Admitted to hospitalist for TIA workup.      Trixie Dredge,  PA-C 10/21/13 1759

## 2013-10-21 NOTE — H&P (Signed)
[facility] History and Physical  [name] HYQ:[id number] DOB: Jun 12, 1946 DOA: 10/21/2013  Referring physician: ED  PCP: Rudi Heap, MD   Chief Complaint: Slurred speech since one day  HPI:  67 year old female with history of hypertension, vitamin B12 deficiency, hypothyroidism, history of TIA a few years back who was at work today when she noticed having word finding difficulty with slurred speech for almost 2-3 minutes. Patient also had some associated right frontal headache. She denies any dizziness, tinnitus, loss of consciousness, unsteadiness, chest pain, palpitations, shortness of breath, nausea, vomiting, diaphoresis, abdominal pain, bowel or unit his symptoms. Denies any weakness of her extremities. Denies any tingling or numbness. She reports having similar symptoms when she had a TIA a few years back. She reports having stress at her work today. She does report drinking a few times a week but denies using alcohol yesterday. She denies any fever, chills, change in her weight or appetite. Denies any recent travel or change in her weight. She went to her PCP office and who then send her to the ED.  Course in the ED Head CT and MRI done in the ED was unremarkable  Hospital is called for TIA workup and observation  Review of Systems:  Constitutional: Denies fever, chills, diaphoresis, appetite change and fatigue.  HEENT: Denies photophobia, eye pain, redness, hearing loss, ear pain, congestion, sore throat, rhinorrhea, sneezing, mouth sores, trouble swallowing, neck pain, neck stiffness and tinnitus.   Respiratory: Denies SOB, DOE, cough, chest tightness,  and wheezing.   Cardiovascular: Denies chest pain, palpitations and leg swelling.  Gastrointestinal: Denies nausea, vomiting, abdominal pain, diarrhea, constipation, blood in stool and abdominal distention.  Genitourinary: Denies dysuria, urgency, frequency, hematuria, flank pain and difficulty urinating.  Endocrine:  Denies polyuria, polydipsia. Musculoskeletal: Denies myalgias, back pain, joint swelling, arthralgias and gait problem.  Skin: Denies pallor, rash and wound.  Neurological: Denies dizziness, seizures, syncope, weakness, light-headedness, numbness and headaches. slurred speech   Past Medical History  Diagnosis Date  . Hypertension   . Menopause   . Hypothyroid   . Mini stroke   . Hyperlipidemia   . B12 deficiency   . Shortness of breath   . Headache(784.0)   . Anxiety   . Pneumonia   . Anemia   . Depression   . Stroke 2010    no deficits  . Alcoholism 01/09/2012  . Hyponatremia 01/09/2012  . Chronic kidney disease (CKD), stage II (mild)   . COPD (chronic obstructive pulmonary disease)   . Orthostatic syncope 10/02/2012  . Carotid artery narrowing 10/03/2012    On the right.   Past Surgical History  Procedure Laterality Date  . Appendectomy    . Tonsillectomy    . Cataract extraction w/phaco  06/20/2012    Procedure: CATARACT EXTRACTION PHACO AND INTRAOCULAR LENS PLACEMENT (IOC);  Surgeon: Gemma Payor, MD;  Location: [facility];  Service: Ophthalmology;  Laterality: Right;  CDE:10.66  . Cataract extraction w/phaco  07/11/2012    Procedure: CATARACT EXTRACTION PHACO AND INTRAOCULAR LENS PLACEMENT (IOC);  Surgeon: Gemma Payor, MD;  Location: [facility];  Service: Ophthalmology;  Laterality: Left;  CDE: 12.69   Social History:  reports that she quit smoking about 21 years ago. Her smoking use included Cigarettes. She has a 60 pack-year smoking history. She does not have any smokeless tobacco history on file. She reports that she drinks about 1.0 ounces of alcohol per week. She reports that she does not use illicit drugs.  Allergies  Allergen Reactions  .  Norvasc [Amlodipine Besylate] Hives  . Procardia [Nifedipine] Swelling    Edema      No family history on file.  Prior to Admission medications   Medication Sig Start Date End Date Taking? Authorizing Provider  acetaminophen  (TYLENOL) 500 MG tablet Take 500 mg by mouth every 6 (six) hours as needed. Pain   Yes Historical Provider, MD  Aclidinium Bromide (TUDORZA PRESSAIR) 400 MCG/ACT AEPB Inhale 1 puff into the lungs 2 (two) times daily. 02/25/13  Yes Mary-Margaret Daphine Deutscher, FNP  allopurinol (ZYLOPRIM) 100 MG tablet Take 200 mg by mouth daily. 10/13/13  Yes Deatra Canter, FNP  budesonide-formoterol (SYMBICORT) 160-4.5 MCG/ACT inhaler Inhale 2 puffs into the lungs 2 (two) times daily.   Yes Historical Provider, MD  cetirizine (ZYRTEC) 10 MG tablet Take 10 mg by mouth daily.   Yes Historical Provider, MD  cyanocobalamin (,VITAMIN B-12,) 1000 MCG/ML injection Inject 1,000 mcg into the muscle every 30 (thirty) days.   Yes Historical Provider, MD  DULoxetine (CYMBALTA) 60 MG capsule Take 1 capsule (60 mg total) by mouth daily. 07/25/13  Yes Mary-Margaret Daphine Deutscher, FNP  furosemide (LASIX) 40 MG tablet Take 1 tablet (40 mg total) by mouth daily. 07/29/13  Yes Ernestina Penna, MD  hydrOXYzine (ATARAX/VISTARIL) 25 MG tablet Take 1 tablet (25 mg total) by mouth at bedtime. 07/25/13  Yes Mary-Margaret Daphine Deutscher, FNP  indomethacin (INDOCIN SR) 75 MG CR capsule Take 1 capsule (75 mg total) by mouth 2 (two) times daily with a meal. 10/10/13  Yes Deatra Canter, FNP  levothyroxine (SYNTHROID, LEVOTHROID) 125 MCG tablet Take 125 mcg by mouth daily before breakfast.   Yes Historical Provider, MD  medroxyPROGESTERone (PROVERA) 2.5 MG tablet Take 2.5 mg by mouth daily.  06/11/13  Yes Historical Provider, MD  metoprolol tartrate (LOPRESSOR) 25 MG tablet Take 25 mg by mouth daily.  03/11/13  Yes Ernestina Penna, MD  predniSONE (DELTASONE) 20 MG tablet 2 po at same time daily for 5 days 07/25/13  Yes Mary-Margaret Daphine Deutscher, FNP  PREMARIN 1.25 MG tablet Take 1.25 mg by mouth daily.  06/11/13  Yes Historical Provider, MD  traMADol (ULTRAM) 50 MG tablet Take 50 mg by mouth every 8 (eight) hours as needed for pain.   Yes Historical Provider, MD  triamcinolone  cream (KENALOG) 0.1 % Apply topically 2 (two) times daily.   Yes Historical Provider, MD  valsartan (DIOVAN) 160 MG tablet Take 1 tablet (160 mg total) by mouth daily. 10/03/12  Yes Elliot Cousin, MD    Physical Exam:  Filed Vitals:   10/21/13 1417 10/21/13 1500 10/21/13 1515 10/21/13 1739  BP: 197/105 164/101 169/96 198/86  Pulse: 95 89 85 90  Temp: 98.3 F (36.8 C)     TempSrc: Oral     Resp:  13 21 18   Height: 5\' 9"  (1.753 m)     Weight: 78.472 kg (173 lb)     SpO2: 100% 100% 100% 100%    Constitutional: Vital signs reviewed.  Elderly female lying in bed in no acute distress HEENT: No pallor, no icterus, moist oral mucosa , no carotid bruit Cardiovascular: RRR, S1 normal, S2 normal, no MRG, Pulmonary/Chest: CTAB, no wheezes, rales, or rhonchi Abdominal: Soft. Non-tender, non-distended, bowel sounds are normal,  Extremities: Warm, no edema CNS: AAO x3, nose 2-12 intact, normal motor tone , Power and reflex, normal sensation   Labs on Admission:  Basic Metabolic Panel:  Recent Labs Lab 10/21/13 1512  NA 134*  K 3.9  CL  99  CO2 26  GLUCOSE 97  BUN 15  CREATININE 1.12*  CALCIUM 8.7   Liver Function Tests: No results found for this basename: AST, ALT, ALKPHOS, BILITOT, PROT, ALBUMIN,  in the last 168 hours No results found for this basename: LIPASE, AMYLASE,  in the last 168 hours No results found for this basename: AMMONIA,  in the last 168 hours CBC:  Recent Labs Lab 10/21/13 1512  WBC 7.7  NEUTROABS 5.1  HGB 12.5  HCT 35.2*  MCV 91.9  PLT 205   Cardiac Enzymes: No results found for this basename: CKTOTAL, CKMB, CKMBINDEX, TROPONINI,  in the last 168 hours BNP: No components found with this basename: POCBNP,  CBG: No results found for this basename: GLUCAP,  in the last 168 hours  Radiological Exams on Admission: Ct Head Wo Contrast  10/21/2013   CLINICAL DATA:  Difficulty with speech.  EXAM: CT HEAD WITHOUT CONTRAST  TECHNIQUE: Contiguous axial  images were obtained from the base of the skull through the vertex without intravenous contrast.  COMPARISON:  Head CT scan 10/02/2012.  FINDINGS: Hypoattenuation in the subcortical and periventricular deep white matter is compatible with chronic microvascular ischemic change. There is no evidence of acute intracranial abnormality including infarction, hemorrhage, mass lesion, mass effect, midline shift or abnormal extra-axial fluid collection. No hydrocephalus or pneumocephalus. The calvarium is intact. Imaged paranasal sinuses and mastoid air cells are clear.  IMPRESSION: No acute finding. Chronic microvascular ischemic change.   Electronically Signed   By: Drusilla Kanner M.D.   On: 10/21/2013 15:00   Mr Brain Wo Contrast  10/21/2013   CLINICAL DATA:  Speech difficulties. No focal defects. Stroke risk factors include hypertension, and chronic renal disease.  EXAM: MRI HEAD WITHOUT CONTRAST  TECHNIQUE: Multiplanar, multiecho pulse sequences of the brain and surrounding structures were obtained without intravenous contrast.  COMPARISON:  CT head 10/21/2013.  FINDINGS: No evidence for acute infarction, hemorrhage, mass lesion, hydrocephalus, or extra-axial fluid. There is mild premature for age cerebral and cerebellar atrophy. Moderate subcortical and periventricular T2 and FLAIR hyperintensities, likely chronic microvascular ischemic change. Pituitary, pineal, and cerebellar tonsils unremarkable. No upper cervical lesions. Visualized calvarium, skull base, and upper cervical osseous structures unremarkable. Scalp and extracranial soft tissues, orbits, sinuses, and mastoids show no acute process.  IMPRESSION: Mild atrophy. Moderate chronic microvascular ischemic change. No acute intracranial findings. Specifically no evidence for acute cerebral infarction or visible MR hemorrhage.   Electronically Signed   By: Davonna Belling M.D.   On: 10/21/2013 17:36    EAV:[id number]  Assessment/Plan Principal Problem:    TIA (transient ischemic attack) Admit to telemetry under observation Neurochecks. Head CT and MRI negative for any stroke. Symptoms resolved in a few minutes. Patient is not on any antiplatelet for reason unclear. I have started her on full dose aspirin. Check lipid panel and hemoglobin A1c in the morning. Check 2-D echo. Check carotid Dopplers.  Last checked one year back showing about 50-69% rt ICA stenosis. -And also noted to have elevated blood pressure that could contribute to underlying symptoms  Active Problems:   HTN (hypertension) Uncontrolled. Patient is on  Metoprolol and Lasix which I have continued. Add when necessary hydralazine.    Hyperlipidemia Not on any medications. Check lipid panel     B12 deficiency Continue cyanocobalamin    Hypothyroidism Continue Synthroid. Check TSH    Chronic kidney disease (CKD), stage II (mild) appears at baseline. Hold NSAIDs   DVT prophylaxis: Subcutaneous Lovenox  Diet:  Cardiac     Code Status: Full code Family Communication: None at bedside Disposition Plan: Home once workup completed  Eddie North [facility] Pager [phone]  If 7PM-7AM, please contact night-coverage [email or web address] Password [email or web address] 10/21/2013, 6:05 PM    Total time spent: 65  minutes

## 2013-10-22 DIAGNOSIS — E538 Deficiency of other specified B group vitamins: Secondary | ICD-10-CM

## 2013-10-22 DIAGNOSIS — D649 Anemia, unspecified: Secondary | ICD-10-CM

## 2013-10-22 DIAGNOSIS — E039 Hypothyroidism, unspecified: Secondary | ICD-10-CM

## 2013-10-22 DIAGNOSIS — E785 Hyperlipidemia, unspecified: Secondary | ICD-10-CM

## 2013-10-22 DIAGNOSIS — N179 Acute kidney failure, unspecified: Secondary | ICD-10-CM

## 2013-10-22 DIAGNOSIS — G459 Transient cerebral ischemic attack, unspecified: Secondary | ICD-10-CM

## 2013-10-22 DIAGNOSIS — E876 Hypokalemia: Secondary | ICD-10-CM

## 2013-10-22 DIAGNOSIS — I519 Heart disease, unspecified: Secondary | ICD-10-CM

## 2013-10-22 LAB — HEMOGLOBIN A1C
Hgb A1c MFr Bld: 5 % (ref <5.7)
Mean Plasma Glucose: 97 mg/dL (ref <117)

## 2013-10-22 LAB — LIPID PANEL
Cholesterol: 177 mg/dL (ref 0–200)
HDL: 39 mg/dL — ABNORMAL LOW (ref >39)
LDL Cholesterol: 80 mg/dL (ref 0–99)
Total CHOL/HDL Ratio: 4.5 RATIO
Triglycerides: 289 mg/dL — ABNORMAL HIGH (ref <150)
VLDL: 58 mg/dL — ABNORMAL HIGH (ref 0–40)

## 2013-10-22 LAB — TSH: TSH: 0.379 u[IU]/mL (ref 0.350–4.500)

## 2013-10-22 MED ORDER — ASPIRIN 325 MG PO TABS: 325.0000 mg | ORAL_TABLET | Freq: Every day | ORAL | Status: DC

## 2013-10-22 MED ORDER — METOPROLOL TARTRATE 50 MG PO TABS: 50.0000 mg | ORAL_TABLET | Freq: Every day | ORAL | Status: DC

## 2013-10-22 NOTE — Evaluation (Signed)
Occupational Therapy Evaluation Patient Details Name: [name] MRN: [id number] DOB: 1946-11-30 Today's Date: 10/22/2013 Time: 4098-1191 OT Time Calculation (min): 17 min  OT Assessment / Plan / Recommendation History of present illness 67 yo female admiited with difficulty speaking and front of body numbness. Pt with right frontal headache. Pt reports symptoms have resolved. All workup is negative CT and MRI   Clinical Impression   Patient evaluated by Occupational Therapy with no further acute OT needs identified. All education has been completed and the patient has no further questions. See below for any follow-up Occupational Therapy or equipment needs. OT to sign off. Thank you for referral.      OT Assessment  Patient does not need any further OT services    Follow Up Recommendations  No OT follow up    Barriers to Discharge      Equipment Recommendations  None recommended by OT    Recommendations for Other Services    Frequency       Precautions / Restrictions Precautions Precautions: None Restrictions Weight Bearing Restrictions: No   Pertinent Vitals/Pain none    ADL  Eating/Feeding: Independent Where Assessed - Eating/Feeding: Bed level Grooming: Wash/dry hands;Wash/dry face;Teeth care;Independent Where Assessed - Grooming: Unsupported standing Toilet Transfer: Community education officer Method: Sit to Barista: Regular height toilet Tub/Shower Transfer: Designer, industrial/product Method: Science writer: Shower seat without back Equipment Used: Gait belt Transfers/Ambulation Related to ADLs: pt ambulating with a slight favor to teh right due to gout in LT LE. pt able to ambulate without difficulty ADL Comments: Pt with normal sensation in bil UE and reports returning to "normal" Pt is eager to return to work tomorrow and take care of her two cats. Pt opening containers without difficulty.    OT  Diagnosis:    OT Problem List:   OT Treatment Interventions:     OT Goals(Current goals can be found in the care plan section)    Visit Information  Last OT Received On: 10/22/13 Assistance Needed: +1 History of Present Illness: 67 yo female admiited with difficulty speaking and front of body numbness. Pt with right frontal headache. Pt reports symptoms have resolved. All workup is negative CT and MRI       Prior Functioning     Home Living Family/patient expects to be discharged to:: Private residence Living Arrangements: Children Available Help at Discharge: Family Type of Home: House Home Access: Stairs to enter Secretary/administrator of Steps: 3 Home Layout: One level Home Equipment: Cane - single point;Shower seat Prior Function Level of Independence: Independent Communication Communication: No difficulties Dominant Hand: Left         Vision/Perception Vision - History Baseline Vision: Wears contacts Patient Visual Report: No change from baseline   Cognition  Cognition Arousal/Alertness: Awake/alert Behavior During Therapy: WFL for tasks assessed/performed Overall Cognitive Status: Within Functional Limits for tasks assessed    Extremity/Trunk Assessment Upper Extremity Assessment Upper Extremity Assessment: Overall WFL for tasks assessed Lower Extremity Assessment Lower Extremity Assessment: Defer to PT evaluation Cervical / Trunk Assessment Cervical / Trunk Assessment: Normal     Mobility Bed Mobility Bed Mobility: Supine to Sit;Sitting - Scoot to Edge of Bed Supine to Sit: 7: Independent Sitting - Scoot to Edge of Bed: 7: Independent Transfers Transfers: Sit to Stand;Stand to Sit Sit to Stand: 7: Independent;With upper extremity assist;From bed Stand to Sit: 7: Independent;To chair/3-in-1     Exercise     Balance Balance Balance Assessed:  Yes Standardized Balance Assessment High Level Balance High Level Balance Activites: Side  stepping;Backward walking;Direction changes;Turns;Sudden stops;Head turns High Level Balance Comments: steady with all balance activities   End of Session OT - End of Session Activity Tolerance: Patient tolerated treatment well Patient left: with call bell/phone within reach;in chair  GO Functional Assessment Tool Used: clinical judgement Functional Limitation: Self care Self Care Current Status (Z6109): 0 percent impaired, limited or restricted Self Care Goal Status (U0454): 0 percent impaired, limited or restricted Self Care Discharge Status [phone]): 0 percent impaired, limited or restricted   Boone Master B 10/22/2013, 8:46 AM Pager: [phone]

## 2013-10-22 NOTE — Evaluation (Signed)
Physical Therapy Evaluation Patient Details Name: [name] MRN: [id number] DOB: Jun 07, 1946 Today's Date: 10/22/2013 Time: 0822-0849 PT Time Calculation (min): 27 min  PT Assessment / Plan / Recommendation History of Present Illness  67 yo female admiited with difficulty speaking and front of body numbness. Pt with right frontal headache. Pt reports symptoms have resolved. All workup is negative CT and MRI  Clinical Impression  Patient independent for all aspects of mobility, safe for d/c home. No acute PT needs. Will sign off.   Patient educated on BE FAST stroke signs   PT Assessment  Patent does not need any further PT services    Follow Up Recommendations  No PT follow up          Equipment Recommendations  None recommended by PT          Precautions / Restrictions Precautions Precautions: None Restrictions Weight Bearing Restrictions: No   Pertinent Vitals/Pain 1/10 pain in right foot 2/2 gout      Mobility  Bed Mobility Bed Mobility: Supine to Sit;Sitting - Scoot to Edge of Bed Supine to Sit: 7: Independent Sitting - Scoot to Edge of Bed: 7: Independent Transfers Transfers: Sit to Stand;Stand to Sit Sit to Stand: 7: Independent;With upper extremity assist;From bed Stand to Sit: 7: Independent;To chair/3-in-1 Ambulation/Gait Ambulation/Gait Assistance: 7: Independent Ambulation Distance (Feet): 620 Feet Assistive device: None Ambulation/Gait Assistance Details: steady with ambulation Gait Pattern: Within Functional Limits Stairs: Yes Stairs Assistance: 6: Modified independent (Device/Increase time) Stair Management Technique: Two rails;Alternating pattern Number of Stairs: 5 Modified Rankin (Stroke Patients Only) Pre-Morbid Rankin Score: No significant disability Modified Rankin: No significant disability        PT Goals(Current goals can be found in the care plan section) Acute Rehab PT Goals Patient Stated Goal: to go home PT Goal  Formulation: No goals set, d/c therapy  Visit Information  Last PT Received On: 10/22/13 Assistance Needed: +1 History of Present Illness: 67 yo female admiited with difficulty speaking and front of body numbness. Pt with right frontal headache. Pt reports symptoms have resolved. All workup is negative CT and MRI       Prior Functioning  Home Living Family/patient expects to be discharged to:: Private residence Living Arrangements: Children Available Help at Discharge: Family Type of Home: House Home Access: Stairs to enter Secretary/administrator of Steps: 3 Home Layout: One level Home Equipment: Cane - single point;Shower seat Prior Function Level of Independence: Independent Communication Communication: No difficulties Dominant Hand: Left    Cognition  Cognition Arousal/Alertness: Awake/alert Behavior During Therapy: WFL for tasks assessed/performed Overall Cognitive Status: Within Functional Limits for tasks assessed    Extremity/Trunk Assessment Upper Extremity Assessment Upper Extremity Assessment: Overall WFL for tasks assessed Lower Extremity Assessment Lower Extremity Assessment: Defer to PT evaluation Cervical / Trunk Assessment Cervical / Trunk Assessment: Normal   Balance Balance Balance Assessed: Yes Standardized Balance Assessment Standardized Balance Assessment: Dynamic Gait Index Dynamic Gait Index Level Surface: Normal Change in Gait Speed: Normal Gait with Horizontal Head Turns: Normal Gait with Vertical Head Turns: Normal Gait and Pivot Turn: Normal Step Over Obstacle: Normal Step Around Obstacles: Normal Steps: Normal Total Score: 24 High Level Balance High Level Balance Activites: Side stepping;Backward walking;Direction changes;Turns;Sudden stops;Head turns High Level Balance Comments: steady with all balance activities  End of Session PT - End of Session Equipment Utilized During Treatment: Gait belt Activity Tolerance: Patient tolerated  treatment well Patient left: in chair;with call bell/phone within reach Nurse Communication: Mobility status  GP     Fabio Asa 10/22/2013, 9:25 AM Charlotte Crumb, PT DPT  [phone]

## 2013-10-22 NOTE — Progress Notes (Signed)
*  PRELIMINARY RESULTS* Vascular Ultrasound Carotid Duplex (Doppler) has been completed.  Findings suggest 1-39% internal carotid artery stenosis bilaterally. Vertebral arteries are partent with antegrade flow.  10/22/2013 2:58 PM Gertie Fey, RVT, RDCS, RDMS

## 2013-10-22 NOTE — Progress Notes (Signed)
UR COMPLETED  

## 2013-10-22 NOTE — Progress Notes (Signed)
Discharge instructions given. Pt verbalized understanding and all questions were answered.  

## 2013-10-22 NOTE — Progress Notes (Signed)
  Echocardiogram 2D Echocardiogram has been performed.  Cathie Beams 10/22/2013, 12:39 PM

## 2013-10-22 NOTE — Discharge Summary (Addendum)
Physician Discharge Summary  [name] ZOX:[id number] DOB: 1946/06/06 DOA: 10/21/2013  PCP: Rudi Heap, MD  Admit date: 10/21/2013 Discharge date: 10/22/2013  Time spent: > 35 minutes  Recommendations for Outpatient Follow-up:  1. Please be sure to follow up with her primary care physician and have your blood pressure recheck. 2. Will add aspirin as patient is at increased risk of developing stroke when compared to the general population given her recent hospitalization  Discharge Diagnoses:  Principal Problem:   TIA (transient ischemic attack) Active Problems:   HTN (hypertension)   Hyperlipidemia   Anemia   B12 deficiency   Hypothyroidism   Chronic kidney disease (CKD), stage II (mild)   Alcohol abuse   Discharge Condition: Stable  Diet recommendation: Low-sodium/heart healthy  Filed Weights   10/21/13 1417  Weight: 78.472 kg (173 lb)    History of present illness:  The patient is a 67 year old Caucasian female with a history of hypertension, vitamin B12 deficiency, hypothyroidism, and history of TIA a few years ago. She presented with transient chest numbness as well as slurred speech which lasted less than 10 minutes.  Hospital Course:  1. TIA - MRI brain/CT head were negative for acute stroke - Physical therapy recommended no followup - Echocardiogram performed official reports are pending. Patient does not wish to stay until official report available. She reports that technician told her that there were no clots. -Patient is at increased risk for developing strokes within the next week or 2, as such I will plan on placing the patient on aspirin. I have discussed that alcohol intake can increase the risks of developing strokes and as such have recommended cessation versus limiting drinks to one drink per day and appropriate definitions of one drink a day were discussed. - Patient will need improved blood pressure control - Addendum: Preliminary carotid doppler  negative  2. Hypertension -We'll continue home medications but will increase metoprolol to 50 mg daily.  3. hypothyroidism -We'll plan on having the patient continue home regimen Synthroid  4. B12 deficiency Kline-stable we'll continue home regimen  5. Chronic kidney disease -Stable, last serum creatinine 1.12   Procedures:  MRI of brain  Echocardiogram  CT of head  Consultations:  None  Discharge Exam: Filed Vitals:   10/22/13 1406  BP: 162/80  Pulse: 82  Temp: 98.1 F (36.7 C)  Resp: 18    General: Pt in NAD, Alert and Awake Cardiovascular: RRR, no MRG Respiratory: CTA BL, no wheezes.  Discharge Instructions  Discharge Orders   Future Orders Complete By Expires   Call MD for:  persistant dizziness or light-headedness  As directed    Call MD for:  temperature >100.4  As directed    Diet - low sodium heart healthy  As directed    Discharge instructions  As directed    Comments:     Please follow up with your pcp in 1-2 weeks or sooner should any new concerns arise.   Increase activity slowly  As directed        Medication List         acetaminophen 500 MG tablet  Commonly known as:  TYLENOL  Take 500 mg by mouth every 6 (six) hours as needed. Pain     Aclidinium Bromide 400 MCG/ACT Aepb  Commonly known as:  TUDORZA PRESSAIR  Inhale 1 puff into the lungs 2 (two) times daily.     allopurinol 100 MG tablet  Commonly known as:  ZYLOPRIM  Take 200 mg  by mouth daily.     aspirin 325 MG tablet  Take 1 tablet (325 mg total) by mouth daily.     budesonide-formoterol 160-4.5 MCG/ACT inhaler  Commonly known as:  SYMBICORT  Inhale 2 puffs into the lungs 2 (two) times daily.     cetirizine 10 MG tablet  Commonly known as:  ZYRTEC  Take 10 mg by mouth daily.     cyanocobalamin 1000 MCG/ML injection  Commonly known as:  (VITAMIN B-12)  Inject 1,000 mcg into the muscle every 30 (thirty) days.     DULoxetine 60 MG capsule  Commonly known as:   CYMBALTA  Take 1 capsule (60 mg total) by mouth daily.     furosemide 40 MG tablet  Commonly known as:  LASIX  Take 1 tablet (40 mg total) by mouth daily.     hydrOXYzine 25 MG tablet  Commonly known as:  ATARAX/VISTARIL  Take 1 tablet (25 mg total) by mouth at bedtime.     indomethacin 75 MG CR capsule  Commonly known as:  INDOCIN SR  Take 1 capsule (75 mg total) by mouth 2 (two) times daily with a meal.     levothyroxine 125 MCG tablet  Commonly known as:  SYNTHROID, LEVOTHROID  Take 125 mcg by mouth daily before breakfast.     medroxyPROGESTERone 2.5 MG tablet  Commonly known as:  PROVERA  Take 2.5 mg by mouth daily.     metoprolol 50 MG tablet  Commonly known as:  LOPRESSOR  Take 1 tablet (50 mg total) by mouth daily.     predniSONE 20 MG tablet  Commonly known as:  DELTASONE  2 po at same time daily for 5 days     PREMARIN 1.25 MG tablet  Generic drug:  estrogens (conjugated)  Take 1.25 mg by mouth daily.     traMADol 50 MG tablet  Commonly known as:  ULTRAM  Take 50 mg by mouth every 8 (eight) hours as needed for pain.     triamcinolone cream 0.1 %  Commonly known as:  KENALOG  Apply topically 2 (two) times daily.     valsartan 160 MG tablet  Commonly known as:  DIOVAN  Take 1 tablet (160 mg total) by mouth daily.       Allergies  Allergen Reactions  . Norvasc [Amlodipine Besylate] Hives  . Procardia [Nifedipine] Swelling    Edema        The results of significant diagnostics from this hospitalization (including imaging, microbiology, ancillary and laboratory) are listed below for reference.    Significant Diagnostic Studies: Ct Head Wo Contrast  10/21/2013   CLINICAL DATA:  Difficulty with speech.  EXAM: CT HEAD WITHOUT CONTRAST  TECHNIQUE: Contiguous axial images were obtained from the base of the skull through the vertex without intravenous contrast.  COMPARISON:  Head CT scan 10/02/2012.  FINDINGS: Hypoattenuation in the subcortical and  periventricular deep white matter is compatible with chronic microvascular ischemic change. There is no evidence of acute intracranial abnormality including infarction, hemorrhage, mass lesion, mass effect, midline shift or abnormal extra-axial fluid collection. No hydrocephalus or pneumocephalus. The calvarium is intact. Imaged paranasal sinuses and mastoid air cells are clear.  IMPRESSION: No acute finding. Chronic microvascular ischemic change.   Electronically Signed   By: Drusilla Kanner M.D.   On: 10/21/2013 15:00   Mr Brain Wo Contrast  10/21/2013   CLINICAL DATA:  Speech difficulties. No focal defects. Stroke risk factors include hypertension, and chronic renal disease.  EXAM: MRI HEAD WITHOUT CONTRAST  TECHNIQUE: Multiplanar, multiecho pulse sequences of the brain and surrounding structures were obtained without intravenous contrast.  COMPARISON:  CT head 10/21/2013.  FINDINGS: No evidence for acute infarction, hemorrhage, mass lesion, hydrocephalus, or extra-axial fluid. There is mild premature for age cerebral and cerebellar atrophy. Moderate subcortical and periventricular T2 and FLAIR hyperintensities, likely chronic microvascular ischemic change. Pituitary, pineal, and cerebellar tonsils unremarkable. No upper cervical lesions. Visualized calvarium, skull base, and upper cervical osseous structures unremarkable. Scalp and extracranial soft tissues, orbits, sinuses, and mastoids show no acute process.  IMPRESSION: Mild atrophy. Moderate chronic microvascular ischemic change. No acute intracranial findings. Specifically no evidence for acute cerebral infarction or visible MR hemorrhage.   Electronically Signed   By: Davonna Belling M.D.   On: 10/21/2013 17:36    Microbiology: No results found for this or any previous visit (from the past 240 hour(s)).   Labs: Basic Metabolic Panel:  Recent Labs Lab 10/21/13 1512  NA 134*  K 3.9  CL 99  CO2 26  GLUCOSE 97  BUN 15  CREATININE 1.12*   CALCIUM 8.7   Liver Function Tests: No results found for this basename: AST, ALT, ALKPHOS, BILITOT, PROT, ALBUMIN,  in the last 168 hours No results found for this basename: LIPASE, AMYLASE,  in the last 168 hours No results found for this basename: AMMONIA,  in the last 168 hours CBC:  Recent Labs Lab 10/21/13 1512  WBC 7.7  NEUTROABS 5.1  HGB 12.5  HCT 35.2*  MCV 91.9  PLT 205   Cardiac Enzymes: No results found for this basename: CKTOTAL, CKMB, CKMBINDEX, TROPONINI,  in the last 168 hours BNP: BNP (last 3 results) No results found for this basename: PROBNP,  in the last 8760 hours CBG: No results found for this basename: GLUCAP,  in the last 168 hours     Signed:  Penny Pia  [facility] 10/22/2013, 2:31 PM

## 2013-10-22 NOTE — Progress Notes (Signed)
Patient refusing all inhalers. She states"I no longer use any inhalers, I only used them when I had PNA months ago."

## 2013-10-26 NOTE — ED Provider Notes (Signed)
Medical screening examination/treatment/procedure(s) were performed by non-physician practitioner and as supervising physician I was immediately available for consultation/collaboration.  EKG Interpretation     Ventricular Rate:  89 PR Interval:  142 QRS Duration: 93 QT Interval:  350 QTC Calculation: 426 R Axis:   53 Text Interpretation:  Sinus rhythm Ventricular premature complex Probable left atrial enlargement ED PHYSICIAN INTERPRETATION AVAILABLE IN [place] Tacey Ruiz, MD 10/26/13 1000

## 2013-10-28 ENCOUNTER — Ambulatory Visit (INDEPENDENT_AMBULATORY_CARE_PROVIDER_SITE_OTHER): Payer: BC Managed Care – PPO | Admitting: Family Medicine

## 2013-10-28 ENCOUNTER — Encounter: Payer: Self-pay | Admitting: Family Medicine

## 2013-10-28 VITALS — BP 200/97 | HR 77 | Temp 97.3°F | Ht 68.0 in | Wt 187.7 lb

## 2013-10-28 DIAGNOSIS — M109 Gout, unspecified: Secondary | ICD-10-CM

## 2013-10-28 DIAGNOSIS — G459 Transient cerebral ischemic attack, unspecified: Secondary | ICD-10-CM

## 2013-10-28 DIAGNOSIS — R7309 Other abnormal glucose: Secondary | ICD-10-CM

## 2013-10-28 DIAGNOSIS — F1020 Alcohol dependence, uncomplicated: Secondary | ICD-10-CM

## 2013-10-28 DIAGNOSIS — R739 Hyperglycemia, unspecified: Secondary | ICD-10-CM

## 2013-10-28 DIAGNOSIS — I10 Essential (primary) hypertension: Secondary | ICD-10-CM

## 2013-10-28 DIAGNOSIS — T148XXA Other injury of unspecified body region, initial encounter: Secondary | ICD-10-CM

## 2013-10-28 LAB — POCT INR: INR: 0.9

## 2013-10-28 MED ORDER — VITAMIN B-1 100 MG PO TABS: 100.0000 mg | ORAL_TABLET | Freq: Every day | ORAL | Status: DC

## 2013-10-28 MED ORDER — FOLIC ACID 1 MG PO TABS: 1.0000 mg | ORAL_TABLET | Freq: Every day | ORAL | Status: DC

## 2013-10-28 NOTE — Patient Instructions (Signed)
Stroke Prevention Some medical conditions and behaviors are associated with an increased chance of having a stroke. You may prevent a stroke by making healthy choices and managing medical conditions. Reduce your risk of having a stroke by:  Staying physically active. Get at least 30 minutes of activity on most or all days.  Not smoking. It may also be helpful to avoid exposure to secondhand smoke.  Limiting alcohol use. Moderate alcohol use is considered to be:  No more than 2 drinks per day for men.  No more than 1 drink per day for nonpregnant women.  Eating healthy foods.  Include 5 or more servings of fruits and vegetables a day.  Certain diets may be prescribed to address high blood pressure, high cholesterol, diabetes, or obesity.  Managing your cholesterol levels.  A low-saturated fat, low-trans fat, low-cholesterol, and high-fiber diet may control cholesterol levels.  Take any prescribed medicines to control cholesterol as directed by your caregiver.  Managing your diabetes.  A controlled-carbohydrate, controlled-sugar diet is recommended to manage diabetes.  Take any prescribed medicines to control diabetes as directed by your caregiver.  Controlling your high blood pressure (hypertension).  A low-salt (sodium), low-saturated fat, low-trans fat, and low-cholesterol diet is recommended to manage high blood pressure.  Take any prescribed medicines to control hypertension as directed by your caregiver.  Maintaining a healthy weight.  A reduced-calorie, low-sodium, low-saturated fat, low-trans fat, low-cholesterol diet is recommended to manage weight.  Stopping drug abuse.  Avoiding birth control pills.  Talk to your caregiver about the risks of taking birth control pills if you are over 35 years old, smoke, get migraines, or have ever had a blood clot.  Getting evaluated for sleep disorders (sleep apnea).  Talk to your caregiver about getting a sleep evaluation  if you snore a lot or have excessive sleepiness.  Taking medicines as directed by your caregiver.  For some people, aspirin or blood thinners (anticoagulants) are helpful in reducing the risk of forming abnormal blood clots that can lead to stroke. If you have the irregular heart rhythm of atrial fibrillation, you should be on a blood thinner unless there is a good reason you cannot take them.  Understand all your medicine instructions. SEEK IMMEDIATE MEDICAL CARE IF:   You have sudden weakness or numbness of the face, arm, or leg, especially on one side of the body.  You have sudden confusion.  You have trouble speaking (aphasia) or understanding.  You have sudden trouble seeing in one or both eyes.  You have sudden trouble walking.  You have dizziness.  You have a loss of balance or coordination.  You have a sudden, severe headache with no known cause.  You have new chest pain or an irregular heartbeat. Any of these symptoms may represent a serious problem that is an emergency. Do not wait to see if the symptoms will go away. Get medical help right away. Call your local emergency services (911 in [place] Do not drive yourself to the hospital. Document Released: 01/04/2005 Document Revised: 02/19/2012 Document Reviewed: 05/30/2013 [employer] Patient Information 2014 [employer].  

## 2013-10-28 NOTE — Progress Notes (Signed)
  Subjective:    Patient ID: [name], female    DOB: 29-Oct-1946, 67 y.o.   MRN: [id number]  HPI Pt is here for hospital follow-up. Pt was admitted to hospital for a TIA and hypertension on 10/21/13. Pt was discharged on 10/22/13. Pt denies any weakness or problems with speech or gait. Pt currently taking lopressor, diovan, and lasix which pt states she takes everyday. Pt complains of a headache "on and off" since being discharged from the hospital.  She has hx of alcoholism and she has been Having difficulty with control of her blood pressure.  She states she has cut back to 8 ounces of wine A day.  She states she has been bruising easy.  She has been seen for gout and has recently started Allopurinol for hyperuricemia.  She has not had recent gout attacks.  She feels cloudy and has difficulty With memory.  She c/o bruising easily.   Review of Systems  Respiratory: Negative.   Cardiovascular: Negative.   Musculoskeletal: Negative.   Neurological: Positive for headaches.  All other systems reviewed and are negative.       Objective:   Physical Exam  Vitals reviewed. Constitutional: She is oriented to person, place, and time. She appears well-developed and well-nourished.  Eyes: Pupils are equal, round, and reactive to light.  Cardiovascular: Normal rate, regular rhythm, normal heart sounds and intact distal pulses.   No murmur heard. Pulmonary/Chest: Effort normal and breath sounds normal. No respiratory distress. She has no wheezes.  Abdominal: Soft. Bowel sounds are normal. She exhibits no distension. There is no tenderness.  Musculoskeletal: Normal range of motion. She exhibits no edema and no tenderness.  Neurological: She is alert and oriented to person, place, and time. No cranial nerve deficit.  Skin: Skin is warm and dry. No erythema.  Psychiatric: She has a normal mood and affect. Her behavior is normal. Judgment and thought content normal.    BP 200/97  Pulse 77   Temp(Src) 97.3 F (36.3 C) (Oral)  Ht 5\' 8"  (1.727 m)  Wt 187 lb 11.2 oz (85.14 kg)  BMI 28.55 kg/m2  LMP 08/21/2013   Results for orders placed in visit on 10/28/13  POCT INR      Result Value Range   INR 0.9        Assessment & Plan:  Bruising - Plan: BMP8+EGFR, POCT INR, CANCELED: INR  Unspecified essential hypertension - Plan: BMP8+EGFR.  Increase lasix 40mg  Po bid for 2 days and then back down to qd and follow up next week for re-evaluation.  Gout - Plan: Uric acid level and continue allopurinol 100mg  poqd.  Accelerated hypertension - Plan: BMP8+EGFR.  Increase lasix 40mg  po bid x 2days then Decrease to qd and then discussed she decrease and stop ETOH so her bp will be better Controlled.  TIA (transient ischemic attack) - Plan ASA 325mg  po qd.  Discussed the need to get Her bp under control.   Alcoholism - Plan: folic acid (FOLVITE) 1 MG tablet, thiamine (VITAMIN B-1) 100 MG tablet, CANCELED: INR.  Need to quit ETOH and Dr. Christell Constant comes in room and talks with patient And asks her to DC ETOH.     Note to be out of work until 11/04/13.  Deatra Canter FNP

## 2013-10-29 LAB — URIC ACID: Uric Acid: 9.1 mg/dL — ABNORMAL HIGH (ref 2.5–7.1)

## 2013-10-29 LAB — BMP8+EGFR
BUN/Creatinine Ratio: 13 (ref 11–26)
BUN: 17 mg/dL (ref 8–27)
CO2: 24 mmol/L (ref 18–29)
Calcium: 9.5 mg/dL (ref 8.6–10.2)
Chloride: 97 mmol/L (ref 97–108)
Creatinine, Ser: 1.31 mg/dL — ABNORMAL HIGH (ref 0.57–1.00)
GFR calc Af Amer: 49 mL/min/{1.73_m2} — ABNORMAL LOW (ref >59)
GFR calc non Af Amer: 42 mL/min/{1.73_m2} — ABNORMAL LOW (ref >59)
Glucose: 121 mg/dL — ABNORMAL HIGH (ref 65–99)
Potassium: 5.8 mmol/L — ABNORMAL HIGH (ref 3.5–5.2)
Sodium: 137 mmol/L (ref 134–144)

## 2013-10-29 LAB — AMMONIA: Ammonia: 39 ug/dL (ref 19–87)

## 2013-10-30 ENCOUNTER — Other Ambulatory Visit: Payer: Self-pay | Admitting: Nurse Practitioner

## 2013-11-03 ENCOUNTER — Ambulatory Visit (INDEPENDENT_AMBULATORY_CARE_PROVIDER_SITE_OTHER): Payer: BC Managed Care – PPO | Admitting: Family Medicine

## 2013-11-03 ENCOUNTER — Encounter: Payer: Self-pay | Admitting: Family Medicine

## 2013-11-03 ENCOUNTER — Other Ambulatory Visit: Payer: Self-pay | Admitting: Family Medicine

## 2013-11-03 VITALS — BP 132/78 | HR 87 | Temp 96.5°F | Ht 68.0 in | Wt 182.4 lb

## 2013-11-03 DIAGNOSIS — G459 Transient cerebral ischemic attack, unspecified: Secondary | ICD-10-CM

## 2013-11-03 DIAGNOSIS — M109 Gout, unspecified: Secondary | ICD-10-CM

## 2013-11-03 DIAGNOSIS — I10 Essential (primary) hypertension: Secondary | ICD-10-CM

## 2013-11-03 DIAGNOSIS — F1020 Alcohol dependence, uncomplicated: Secondary | ICD-10-CM

## 2013-11-03 MED ORDER — ALLOPURINOL 300 MG PO TABS: 300.0000 mg | ORAL_TABLET | Freq: Every day | ORAL | Status: DC

## 2013-11-03 NOTE — Progress Notes (Signed)
  Subjective:    Patient ID: [name], female    DOB: 1946/01/07, 67 y.o.   MRN: [id number]  HPI This 67 y.o. female presents for evaluation of follow up.  She was hospitalized recently for Hypertensive emergency and TIA.  She has had TIA work up and she is still feeling confused And foggy.    She has hx of alcoholism and she has still been drinking.  She has been told To cut back to 8 oz of wine a day.  She has been having uncontrolled hypertension.  She Has been taking lasix 40mg  bid for 2 days and then 40mg  po qd.  She has been feeling Better. She has hx of hyperuricemia and her uric acid was elevated. She has been taking Allopurinol 100mg  po qd.   Review of Systems C/o feeling foggy. No chest pain, SOB, HA, dizziness, vision change, N/V, diarrhea, constipation, dysuria, urinary urgency or frequency, myalgias, arthralgias or rash.     Objective:   Physical Exam Vital signs noted  Well developed well nourished female.  HEENT - Head atraumatic Normocephalic                Eyes - PERRLA, Conjuctiva - clear Sclera- Clear EOMI                Ears - EAC's Wnl TM's Wnl Gross Hearing WNL                Throat - oropharanx wnl Respiratory - Lungs CTA bilateral Cardiac - RRR S1 and S2 without murmur GI - Abdomen soft Nontender and bowel sounds active x 4 Extremities - No edema. Neuro - Grossly intact.       Assessment & Plan:  Essential hypertension, benign - Continue lasix 40mg  po qd.  Check BMP today and follow up in 2 weeks.  Discussed she stop drinking due to difficulty to control bp.  Gout - Increase allopurinol to 200mg  po qd and then in a month increase to 300mg  po qd.  Alcoholism - Discussed she needs not to drink anymore and to follow the advice she states she  Was given in the hospital to drink 8 oz of wine a day.  TIA (transient ischemic attack) - ASA 325mg  po qd, stop drinking, and follow up in 2 weeks and see How she feels and given 2 weeks out of work due to  feeling foggy.  Deatra Canter FNP

## 2013-11-03 NOTE — Patient Instructions (Signed)

## 2013-11-04 LAB — BMP8+EGFR
BUN/Creatinine Ratio: 13 (ref 11–26)
BUN: 17 mg/dL (ref 8–27)
CO2: 21 mmol/L (ref 18–29)
Calcium: 9.5 mg/dL (ref 8.6–10.2)
Chloride: 94 mmol/L — ABNORMAL LOW (ref 97–108)
Creatinine, Ser: 1.33 mg/dL — ABNORMAL HIGH (ref 0.57–1.00)
GFR calc Af Amer: 48 mL/min/{1.73_m2} — ABNORMAL LOW (ref >59)
GFR calc non Af Amer: 42 mL/min/{1.73_m2} — ABNORMAL LOW (ref >59)
Glucose: 100 mg/dL — ABNORMAL HIGH (ref 65–99)
Potassium: 4.2 mmol/L (ref 3.5–5.2)
Sodium: 134 mmol/L (ref 134–144)

## 2013-11-12 ENCOUNTER — Telehealth: Payer: Self-pay | Admitting: *Deleted

## 2013-11-12 NOTE — Telephone Encounter (Signed)
Lm to call back

## 2013-11-12 NOTE — Telephone Encounter (Signed)
Message copied by Baltazar Apo on Wed Nov 12, 2013 11:35 AM ------      Message from: Deatra Canter      Created: Tue Nov 04, 2013 10:07 AM       Potassium is normal ------

## 2013-11-12 NOTE — Telephone Encounter (Signed)
Pt.notified

## 2013-11-12 NOTE — Telephone Encounter (Signed)
Message copied by Baltazar Apo on Wed Nov 12, 2013  8:59 AM ------      Message from: Deatra Canter      Created: Mon Nov 03, 2013  8:14 AM       Uric acid is elevated and need to increase allopurinol 100mg  to two po qd.  Potassium is elevated and need to come in and repeat potassium.  Bleeding time INR is normal.  Glucose is elevated and need to add on a hgbaic.  Ammonia level is elevated. ------

## 2013-11-13 LAB — POCT GLYCOSYLATED HEMOGLOBIN (HGB A1C): Hemoglobin A1C: 4.7

## 2013-11-13 NOTE — Addendum Note (Signed)
Addended by: Prescott Gum on: 11/13/2013 10:12 AM   Modules accepted: Orders

## 2013-11-17 ENCOUNTER — Ambulatory Visit (INDEPENDENT_AMBULATORY_CARE_PROVIDER_SITE_OTHER): Payer: BC Managed Care – PPO | Admitting: Family Medicine

## 2013-11-17 ENCOUNTER — Encounter: Payer: Self-pay | Admitting: Family Medicine

## 2013-11-17 ENCOUNTER — Telehealth: Payer: Self-pay | Admitting: *Deleted

## 2013-11-17 VITALS — BP 161/80 | HR 83 | Temp 97.0°F | Ht 68.0 in | Wt 185.6 lb

## 2013-11-17 DIAGNOSIS — I10 Essential (primary) hypertension: Secondary | ICD-10-CM

## 2013-11-17 DIAGNOSIS — G459 Transient cerebral ischemic attack, unspecified: Secondary | ICD-10-CM

## 2013-11-17 MED ORDER — VALSARTAN 320 MG PO TABS: 320.0000 mg | ORAL_TABLET | Freq: Every day | ORAL | Status: DC

## 2013-11-17 NOTE — Telephone Encounter (Signed)
Pt notified of all lab results Verbalizes understanding

## 2013-11-17 NOTE — Telephone Encounter (Signed)
Message copied by Bearl Mulberry on Mon Nov 17, 2013 11:48 AM ------      Message from: Deatra Canter      Created: Mon Nov 03, 2013  8:20 AM       Allopurinol was at 200mg  and now has been increased to 300mg  and rx was sent in and recommend doing add on hgbaic, get repeat potassium ------

## 2013-11-17 NOTE — Progress Notes (Signed)
Pt notified at appt 

## 2013-11-17 NOTE — Progress Notes (Signed)
   Subjective:    Patient ID: [name], female    DOB: 03-20-1946, 67 y.o.   MRN: [id number]  HPI This 67 y.o. female presents for evaluation of follow up on TIA and hypertension. She was hospitalized for this on 10/21/13.  She has been having difficulty with uncontrolled Hypertension.  She has hx of alcoholism and this has complicated her bp control.  She Was advised to cut back and quit drinking.  She is having problems feeling out of sorts and having Confusion.  She has difficulty with dizziness.  She states she feels like she cannot go back to Work because she is to "fuzzy" and is not back to her old self.     Review of Systems No chest pain, SOB, HA, dizziness, vision change, N/V, diarrhea, constipation, dysuria, urinary urgency or frequency, myalgias, arthralgias or rash.     Objective:   Physical Exam Vital signs noted  Well developed well nourished female.  HEENT - Head atraumatic Normocephalic                Eyes - PERRLA, Conjuctiva - clear Sclera- Clear EOMI                Ears - EAC's Wnl TM's Wnl Gross Hearing WNL                Nose - Nares patent                 Throat - oropharanx wnl Respiratory - Lungs CTA bilateral Cardiac - RRR S1 and S2 without murmur GI - Abdomen soft Nontender and bowel sounds active x 4 Extremities - No edema. Neuro - Grossly intact.       Assessment & Plan:  Essential hypertension, benign - Plan: valsartan (DIOVAN) 320 MG tablet, Ambulatory referral to Neurology.  BP Med diovan increased to 320mg  po qd  And follow up in one month.    TIA (transient ischemic attack) - Plan: Ambulatory referral to Neurology  Deatra Canter FNP

## 2013-11-17 NOTE — Patient Instructions (Signed)

## 2013-11-27 ENCOUNTER — Telehealth: Payer: Self-pay | Admitting: Family Medicine

## 2013-11-27 DIAGNOSIS — Z029 Encounter for administrative examinations, unspecified: Secondary | ICD-10-CM

## 2013-12-16 ENCOUNTER — Other Ambulatory Visit: Payer: Self-pay

## 2013-12-16 DIAGNOSIS — F32A Depression, unspecified: Secondary | ICD-10-CM

## 2013-12-16 DIAGNOSIS — F329 Major depressive disorder, single episode, unspecified: Secondary | ICD-10-CM

## 2013-12-16 MED ORDER — DULOXETINE HCL 60 MG PO CPEP: 60.0000 mg | ORAL_CAPSULE | Freq: Every day | ORAL | Status: DC

## 2013-12-18 ENCOUNTER — Ambulatory Visit (INDEPENDENT_AMBULATORY_CARE_PROVIDER_SITE_OTHER): Payer: BC Managed Care – PPO | Admitting: Family Medicine

## 2013-12-18 ENCOUNTER — Encounter (INDEPENDENT_AMBULATORY_CARE_PROVIDER_SITE_OTHER): Payer: Self-pay

## 2013-12-18 ENCOUNTER — Encounter: Payer: Self-pay | Admitting: Family Medicine

## 2013-12-18 VITALS — BP 171/89 | HR 89 | Temp 97.5°F | Ht 68.0 in | Wt 187.0 lb

## 2013-12-18 DIAGNOSIS — G459 Transient cerebral ischemic attack, unspecified: Secondary | ICD-10-CM

## 2013-12-18 DIAGNOSIS — E538 Deficiency of other specified B group vitamins: Secondary | ICD-10-CM

## 2013-12-18 DIAGNOSIS — W19XXXA Unspecified fall, initial encounter: Secondary | ICD-10-CM

## 2013-12-18 DIAGNOSIS — I10 Essential (primary) hypertension: Secondary | ICD-10-CM

## 2013-12-18 MED ORDER — CYANOCOBALAMIN 1000 MCG/ML IJ SOLN
1000.0000 ug | Freq: Once | INTRAMUSCULAR | Status: AC
  Administered 2013-12-18: 1000 ug via INTRAMUSCULAR

## 2013-12-18 MED ORDER — METOPROLOL TARTRATE 50 MG PO TABS: 50.0000 mg | ORAL_TABLET | Freq: Every day | ORAL | Status: DC

## 2013-12-18 NOTE — Patient Instructions (Signed)
Fall Prevention and Home Safety °Falls cause injuries and can affect all age groups. It is possible to prevent falls.  °HOW TO PREVENT FALLS °· Wear shoes with rubber soles that do not have an opening for your toes. °· Keep the inside and outside of your house well lit. °· Use night lights throughout your home. °· Remove clutter from floors. °· Clean up floor spills. °· Remove throw rugs or fasten them to the floor with carpet tape. °· Do not place electrical cords across pathways. °· Put grab bars by your tub, shower, and toilet. Do not use towel bars as grab bars. °· Put handrails on both sides of the stairway. Fix loose handrails. °· Do not climb on stools or stepladders, if possible. °· Do not wax your floors. °· Repair uneven or unsafe sidewalks, walkways, or stairs. °· Keep items you use a lot within reach. °· Be aware of pets. °· Keep emergency numbers next to the telephone. °· Put smoke detectors in your home and near bedrooms. °Ask your doctor what other things you can do to prevent falls. °Document Released: 09/23/2009 Document Revised: 05/28/2012 Document Reviewed: 02/27/2012 °Exit[employer]® Patient Information ©2014 [employer]. ° °

## 2013-12-18 NOTE — Progress Notes (Signed)
   Subjective:    Patient ID: [name], female    DOB: 1946/10/12, 68 y.o.   MRN: [id number]  HPI This 68 y.o. female presents for evaluation of confusion, TIA, and problems with  Confusion.  She has been scheduled for neurology referral and is awaiting an Appointment.  She has fallen a few days ago getting wood and putting it into the [employer] and she stumbled and fell.  She has problems with balance.  She has Hypertension and this is difficult to control due to her alcoholism which she states She has cut back extremely to one or two drinks a day and she states yesterday she Did not drink any alcohol.  She has not taken her bp medicine metoprolol and needs a  Refill.  She is accompanied by her son who takes her to her appointments.  She is  Due for b12 injection.   Review of Systems C/o confusion, memory problems, falls, and balance problems No chest pain, SOB, HA, dizziness, vision change, N/V, diarrhea, constipation, dysuria, urinary urgency or frequency, myalgias, arthralgias or rash.     Objective:   Physical Exam Vital signs noted  Well developed well nourished female.  HEENT - Head atraumatic Normocephalic                Eyes - PERRLA, Conjuctiva - clear Sclera- Clear EOMI                Ears - EAC's Wnl TM's Wnl Gross Hearing WNL                Throat - oropharanx wnl Respiratory - Lungs CTA bilateral Cardiac - RRR S1 and S2 without murmur GI - Abdomen soft Nontender and bowel sounds active x 4 Extremities - No edema. Neuro - Grossly intact.       Assessment & Plan:  Essential hypertension, benign - Plan: metoprolol (LOPRESSOR) 50 MG tablet  B12 deficiency - Plan: cyanocobalamin ((VITAMIN B-12)) injection 1,000 mcg  TIA (transient ischemic attack) - Follow up with Neurology.  Falls, initial encounter - RX for walker written.  Lysbeth Penner FNP

## 2013-12-18 NOTE — Progress Notes (Signed)
Patient tolerated B-12 injection well.

## 2013-12-25 ENCOUNTER — Other Ambulatory Visit: Payer: Self-pay | Admitting: Family Medicine

## 2013-12-25 ENCOUNTER — Telehealth: Payer: Self-pay | Admitting: Family Medicine

## 2013-12-25 NOTE — Telephone Encounter (Signed)
Gout can move from leg to leg and take the indocin

## 2013-12-26 NOTE — Telephone Encounter (Signed)
Patient aware.

## 2014-01-06 ENCOUNTER — Encounter: Payer: Self-pay | Admitting: Neurology

## 2014-01-06 ENCOUNTER — Ambulatory Visit (INDEPENDENT_AMBULATORY_CARE_PROVIDER_SITE_OTHER): Payer: BC Managed Care – PPO | Admitting: Neurology

## 2014-01-06 VITALS — BP 140/80 | HR 86 | Temp 98.2°F | Ht 69.0 in | Wt 185.0 lb

## 2014-01-06 DIAGNOSIS — R4701 Aphasia: Secondary | ICD-10-CM

## 2014-01-06 DIAGNOSIS — G459 Transient cerebral ischemic attack, unspecified: Secondary | ICD-10-CM

## 2014-01-06 NOTE — Progress Notes (Signed)
NEUROLOGY CONSULTATION NOTE  [name] MRN: [id number] DOB: 1945/12/20  Referring provider: Dr. Laurance Flatten Primary care provider: Dr. Laurance Flatten  Reason for consult:  Slurred speech, confusion  HISTORY OF PRESENT ILLNESS: [name] is a 68 year old left-handed woman with history of depression, hypertension, hypothyroidism, chronic kidney disease, B12 deficiency, alcoholism, gout and remote history of TIA who presents for evaluation of TIA.  She is accompanied by her son.  Records and images were personally reviewed where available.    On 10/21/13, she was at work and developed sudden slurred speech with difficulty getting words out.  There was no word-finding difficulty.  Symptoms quickly improved in 30 minutes and completely resolved in a couple of hours.  She presented to the ED and was admitted for testing and observation.  MRI of the brain was negative for acute infarct.  2D Echo revealed LVEF of 50-55% with no source of embolus.  Carotid duplex did not reveal hemodynamically significant stenosis.  Hgb A1c 5.0, LDL 80, TSH 0.379, ammonia 39, BUN/Cr 17/1.31, uric acid 9.1.  She was discharged on full-strength aspirin.  Almost immediately after discharge, she began to have balance problems.  She reports having had falls, which she describes just tipping over.  No associated numbness in feet, dizziness, or leg weakness.  Initially, she exhibited symptoms of apraxia.  Initially, she was putting her coat on backwards.  Another time, she was having trouble adjusting the settings on her stove.  She continues to have speech difficulty with fluctuates in severity.  She is able to understand others without difficulty.  She is able to understand what she reads.  She reports some difficulty writing in regards to penmanship, but not ability to write out the words. There is no actual confusion.  She has some vision problems related to cataracts.  No focal weakness or numbness, however her son said that one time  her right eyelid "dragged" a bit.  She has a history of depression and alcoholism.  At the time of this incident, she was under greater amount of stress at work.  She works as a Agricultural engineer.  She was drinking 7-8 mixed drinks a day.  She has had difficulty optimizing blood pressure control and alcohol use has contributed to this.  She has been off of work until evaluation by neurology.  She has since cut her drinking down to 2 mixed drinks a day.  She is taking thiamine and folic acid. Her son has been helping her out.  She reports a similar episode of slurred speech several years ago.  PAST MEDICAL HISTORY: Past Medical History  Diagnosis Date  . Hypertension   . Menopause   . Hypothyroid   . Mini stroke   . Hyperlipidemia   . B12 deficiency   . Shortness of breath   . Headache(784.0)   . Anxiety   . Pneumonia   . Anemia   . Depression   . Stroke 2010    no deficits  . Alcoholism 01/09/2012  . Hyponatremia 01/09/2012  . Chronic kidney disease (CKD), stage II (mild)   . COPD (chronic obstructive pulmonary disease)   . Orthostatic syncope 10/02/2012  . Carotid artery narrowing 10/03/2012    On the right.    PAST SURGICAL HISTORY: Past Surgical History  Procedure Laterality Date  . Appendectomy    . Tonsillectomy    . Cataract extraction w/phaco  06/20/2012    Procedure: CATARACT EXTRACTION PHACO AND INTRAOCULAR LENS PLACEMENT (IOC);  Surgeon:  Tonny Branch, MD;  Location: [facility];  Service: Ophthalmology;  Laterality: Right;  CDE:10.66  . Cataract extraction w/phaco  07/11/2012    Procedure: CATARACT EXTRACTION PHACO AND INTRAOCULAR LENS PLACEMENT (IOC);  Surgeon: Tonny Branch, MD;  Location: [facility];  Service: Ophthalmology;  Laterality: Left;  CDE: 12.69    MEDICATIONS: Current Outpatient Prescriptions on File Prior to Visit  Medication Sig Dispense Refill  . acetaminophen (TYLENOL) 500 MG tablet Take 500 mg by mouth every 6 (six) hours as needed. Pain      .  allopurinol (ZYLOPRIM) 300 MG tablet Take 1 tablet (300 mg total) by mouth daily.  30 tablet  6  . aspirin 325 MG tablet Take 1 tablet (325 mg total) by mouth daily.  30 tablet  0  . budesonide-formoterol (SYMBICORT) 160-4.5 MCG/ACT inhaler Inhale 2 puffs into the lungs 2 (two) times daily.      . cetirizine (ZYRTEC) 10 MG tablet Take 10 mg by mouth daily.      . clotrimazole-betamethasone (LOTRISONE) cream       . cyanocobalamin (,VITAMIN B-12,) 1000 MCG/ML injection Inject 1,000 mcg into the muscle every 30 (thirty) days.      . DULoxetine (CYMBALTA) 60 MG capsule Take 1 capsule (60 mg total) by mouth daily.  30 capsule  2  . folic acid (FOLVITE) 1 MG tablet Take 1 tablet (1 mg total) by mouth daily.  30 tablet  11  . furosemide (LASIX) 40 MG tablet Take 1 tablet (40 mg total) by mouth daily.  30 tablet  5  . hydrOXYzine (ATARAX/VISTARIL) 25 MG tablet Take 1 tablet (25 mg total) by mouth at bedtime.  30 tablet  0  . indomethacin (INDOCIN SR) 75 MG CR capsule Take 1 capsule (75 mg total) by mouth 2 (two) times daily with a meal.  30 capsule  3  . levothyroxine (SYNTHROID, LEVOTHROID) 125 MCG tablet Take 125 mcg by mouth daily before breakfast.      . medroxyPROGESTERone (PROVERA) 2.5 MG tablet Take 2.5 mg by mouth daily.       . metoprolol (LOPRESSOR) 50 MG tablet Take 1 tablet (50 mg total) by mouth daily.  30 tablet  11  . mycophenolate (CELLCEPT) 500 MG tablet       . PREMARIN 1.25 MG tablet Take 1.25 mg by mouth daily.       Marland Kitchen thiamine (VITAMIN B-1) 100 MG tablet Take 1 tablet (100 mg total) by mouth daily.  30 tablet  11  . traMADol (ULTRAM) 50 MG tablet Take 50 mg by mouth every 8 (eight) hours as needed for pain.      Marland Kitchen triamcinolone cream (KENALOG) 0.1 % Apply topically 2 (two) times daily.      . valsartan (DIOVAN) 320 MG tablet Take 1 tablet (320 mg total) by mouth daily.  30 tablet  11   No current facility-administered medications on file prior to visit.    ALLERGIES: Allergies    Allergen Reactions  . Norvasc [Amlodipine Besylate] Hives  . Procardia [Nifedipine] Swelling    Edema      FAMILY HISTORY: Family History  Problem Relation Age of Onset  . GER disease Mother   . Hypertension Maternal Grandmother     SOCIAL HISTORY: History   Social History  . Marital Status: Widowed    Spouse Name: N/A    Number of Children: N/A  . Years of Education: N/A   Occupational History  . Not on file.   Social  History Main Topics  . Smoking status: Former Smoker -- 2.00 packs/day for 30 years    Types: Cigarettes    Quit date: 06/17/1992  . Smokeless tobacco: Not on file  . Alcohol Use: 7.0 oz/week    14 drink(s) per week  . Drug Use: No  . Sexual Activity: Not on file   Other Topics Concern  . Not on file   Social History Narrative  . No narrative on file    REVIEW OF SYSTEMS: Constitutional: No fevers, chills, or sweats, no generalized fatigue, change in appetite Eyes: No visual changes, double vision, eye pain Ear, nose and throat: No hearing loss, ear pain, nasal congestion, sore throat Cardiovascular: No chest pain, palpitations Respiratory:  No shortness of breath at rest or with exertion, wheezes GastrointestinaI: No nausea, vomiting, diarrhea, abdominal pain, fecal incontinence Genitourinary:  No dysuria, urinary retention or frequency Musculoskeletal:  No neck pain, back pain Integumentary: No rash, pruritus, skin lesions Neurological: as above Psychiatric: No depression, insomnia, anxiety Endocrine: No palpitations, fatigue, diaphoresis, mood swings, change in appetite, change in weight, increased thirst Hematologic/Lymphatic:  No anemia, purpura, petechiae. Allergic/Immunologic: no itchy/runny eyes, nasal congestion, recent allergic reactions, rashes  PHYSICAL EXAM: Filed Vitals:   01/06/14 1251  BP: 140/80  Pulse: 86  Temp: 98.2 F (36.8 C)   General: Anxious, tremulous Head:  Normocephalic/atraumatic Neck: supple, no  paraspinal tenderness, full range of motion Back: No paraspinal tenderness Heart: regular rate and rhythm Lungs: Clear to auscultation bilaterally. Vascular: No carotid bruits. Neurological Exam: Mental status: alert and oriented to person, place, and time, speech fluent and not dysarthric, able to name, write, repeat, read and follow 3 step commands across midline.  Unable to spell WORLD backwards.  Did not quite copy intersecting pentagons correctly.  Did not place the numbers correctly when drawing a clock.  Recalled 1 of 3 words after a couple of minutes.  MMSE 23/30 Cranial nerves: CN I: not tested CN II: pupils equal, round and reactive to light, visual fields intact, fundi unremarkable. CN III, IV, VI:  full range of motion, no nystagmus, no ptosis CN V: facial sensation intact CN VII: upper and lower face symmetric CN VIII: hearing intact CN IX, X: gag intact, uvula midline CN XI: sternocleidomastoid and trapezius muscles intact CN XII: tongue midline Bulk & Tone: normal, no fasciculations. Motor: tremulous, 5/5 throughout Sensation: temperature and vibration intact Deep Tendon Reflexes: 2+ throughout, toes down Finger to nose testing: tremulous.  No dysmetria. Heel to shin: normal Gait: short strides, mildly wide-based, no ataxia.  Difficulty with tandem. Romberg negative.  IMPRESSION: Expressive aphasia.  Not appreciated on exam.  Also describes episodes of apraxia.  Does not really fit TIA.  PLAN: 1.  Will refer for neuropsychological testing to sort out whether this is psychogenic versus organic etiology 2.  Given that these episodes of either intermittent or fluctuates, will get EEG. 3.  Continue B12 injections, thiamine and folic acid  60 minutes spent with patient, over 50% spent counseling and coordinating care.  Thank you for allowing me to take part in the care of this patient.  Metta Clines, DO  CC:  Redge Gainer, MD

## 2014-01-06 NOTE — Patient Instructions (Addendum)
1. I would like to set you up for an EEG to look at your brain waves. February 3@ 10:30 arrive 15 min prior to imaging. [facility] -[phone] 2.  I would like you to get formal neuropsychological testing 3.  If you need help with the alcohol, ask your doctor about any programs to help stop safely 4.  Follow up soon after above tests.

## 2014-01-09 ENCOUNTER — Encounter: Payer: Self-pay | Admitting: Family Medicine

## 2014-01-09 ENCOUNTER — Ambulatory Visit (INDEPENDENT_AMBULATORY_CARE_PROVIDER_SITE_OTHER): Payer: BC Managed Care – PPO | Admitting: Family Medicine

## 2014-01-09 VITALS — BP 171/92 | HR 80 | Temp 97.9°F | Ht 69.0 in | Wt 185.0 lb

## 2014-01-09 DIAGNOSIS — M109 Gout, unspecified: Secondary | ICD-10-CM

## 2014-01-09 DIAGNOSIS — I10 Essential (primary) hypertension: Secondary | ICD-10-CM

## 2014-01-09 DIAGNOSIS — E039 Hypothyroidism, unspecified: Secondary | ICD-10-CM

## 2014-01-09 LAB — POCT CBC
Granulocyte percent: 77.5 %G (ref 37–80)
HCT, POC: 36.9 % — AB (ref 37.7–47.9)
Hemoglobin: 11.5 g/dL — AB (ref 12.2–16.2)
Lymph, poc: 1.7 (ref 0.6–3.4)
MCH, POC: 28.7 pg (ref 27–31.2)
MCHC: 31.2 g/dL — AB (ref 31.8–35.4)
MCV: 92.1 fL (ref 80–97)
MPV: 6.9 fL (ref 0–99.8)
POC Granulocyte: 7.5 — AB (ref 2–6.9)
POC LYMPH PERCENT: 17.5 %L (ref 10–50)
Platelet Count, POC: 395 10*3/uL (ref 142–424)
RBC: 4 M/uL — AB (ref 4.04–5.48)
RDW, POC: 12.5 %
WBC: 9.7 10*3/uL (ref 4.6–10.2)

## 2014-01-09 MED ORDER — METOPROLOL TARTRATE 50 MG PO TABS: 50.0000 mg | ORAL_TABLET | Freq: Two times a day (BID) | ORAL | Status: DC

## 2014-01-09 NOTE — Patient Instructions (Signed)

## 2014-01-09 NOTE — Progress Notes (Signed)
   Subjective:    Patient ID: [name], female    DOB: [birth year], 68 y.o.   MRN: [id number]  HPI This 68 y.o. female presents for evaluation of routine follow up.  She has been having Difficulty with bp control.  She has been seeing neurology for CVA/ TIA sx's and she is awaiting Neuropsychiatric testing.  She has been having some expressive aphasia.  She is accompanied by her son.  Her gout flare has subsided and she is feeling Better.   Review of Systems No chest pain, SOB, HA, dizziness, vision change, N/V, diarrhea, constipation, dysuria, urinary urgency or frequency, myalgias, arthralgias or rash.     Objective:   Physical Exam  Vital signs noted  Well developed well nourished female.  HEENT - Head atraumatic Normocephalic                Eyes - PERRLA, Conjuctiva - clear Sclera- Clear EOMI                Ears - EAC's Wnl TM's Wnl Gross Hearing WNL                Nose - Nares patent                 Throat - oropharanx wnl Respiratory - Lungs CTA bilateral Cardiac - RRR S1 and S2 without murmur GI - Abdomen soft Nontender and bowel sounds active x 4 Extremities - No edema. Neuro - Grossly intact.      Assessment & Plan:  Essential hypertension, benign - Plan: metoprolol (LOPRESSOR) 50 MG tablet  Gout - Plan: POCT CBC, CMP14+EGFR, Uric acid  Unspecified hypothyroidism - Plan: TSH  Follow up in 3 months  Lysbeth Penner FNP

## 2014-01-10 LAB — TSH: TSH: 38.74 u[IU]/mL — ABNORMAL HIGH (ref 0.450–4.500)

## 2014-01-10 LAB — CMP14+EGFR
ALT: 5 IU/L (ref 0–32)
AST: 6 IU/L (ref 0–40)
Albumin/Globulin Ratio: 1.6 (ref 1.1–2.5)
Albumin: 4 g/dL (ref 3.6–4.8)
Alkaline Phosphatase: 86 IU/L (ref 39–117)
BUN/Creatinine Ratio: 12 (ref 11–26)
BUN: 17 mg/dL (ref 8–27)
CO2: 22 mmol/L (ref 18–29)
Calcium: 8.9 mg/dL (ref 8.7–10.3)
Chloride: 94 mmol/L — ABNORMAL LOW (ref 97–108)
Creatinine, Ser: 1.47 mg/dL — ABNORMAL HIGH (ref 0.57–1.00)
GFR calc Af Amer: 43 mL/min/{1.73_m2} — ABNORMAL LOW (ref >59)
GFR calc non Af Amer: 37 mL/min/{1.73_m2} — ABNORMAL LOW (ref >59)
Globulin, Total: 2.5 g/dL (ref 1.5–4.5)
Glucose: 97 mg/dL (ref 65–99)
Potassium: 4.1 mmol/L (ref 3.5–5.2)
Sodium: 134 mmol/L (ref 134–144)
Total Bilirubin: 0.4 mg/dL (ref 0.0–1.2)
Total Protein: 6.5 g/dL (ref 6.0–8.5)

## 2014-01-10 LAB — URIC ACID: Uric Acid: 4.7 mg/dL (ref 2.5–7.1)

## 2014-01-13 ENCOUNTER — Telehealth: Payer: Self-pay | Admitting: *Deleted

## 2014-01-13 ENCOUNTER — Ambulatory Visit (HOSPITAL_COMMUNITY)
Admission: RE | Admit: 2014-01-13 | Discharge: 2014-01-13 | Disposition: A | Discharge status: Home / Self Care | Payer: BC Managed Care – PPO | Source: Ambulatory Visit | Attending: Neurology | Admitting: Neurology

## 2014-01-13 DIAGNOSIS — R488 Other symbolic dysfunctions: Secondary | ICD-10-CM | POA: Insufficient documentation

## 2014-01-13 DIAGNOSIS — F3289 Other specified depressive episodes: Secondary | ICD-10-CM | POA: Insufficient documentation

## 2014-01-13 DIAGNOSIS — E039 Hypothyroidism, unspecified: Secondary | ICD-10-CM | POA: Insufficient documentation

## 2014-01-13 DIAGNOSIS — G459 Transient cerebral ischemic attack, unspecified: Secondary | ICD-10-CM

## 2014-01-13 DIAGNOSIS — R9401 Abnormal electroencephalogram [EEG]: Secondary | ICD-10-CM | POA: Insufficient documentation

## 2014-01-13 DIAGNOSIS — R4789 Other speech disturbances: Secondary | ICD-10-CM | POA: Insufficient documentation

## 2014-01-13 DIAGNOSIS — E538 Deficiency of other specified B group vitamins: Secondary | ICD-10-CM | POA: Insufficient documentation

## 2014-01-13 DIAGNOSIS — N189 Chronic kidney disease, unspecified: Secondary | ICD-10-CM | POA: Insufficient documentation

## 2014-01-13 DIAGNOSIS — F329 Major depressive disorder, single episode, unspecified: Secondary | ICD-10-CM | POA: Insufficient documentation

## 2014-01-13 NOTE — Progress Notes (Signed)
EEG Completed; Results Pending  

## 2014-01-13 NOTE — Telephone Encounter (Signed)
rx called into [facility] for Metoprolol

## 2014-01-14 NOTE — Procedures (Signed)
ELECTROENCEPHALOGRAM REPORT  Date of Study: 01/13/2014  Patient's Name: [name] MRN: [id number] Date of Birth: [birth year]  Indication: 68 year old woman with history of depression, hypothyroidism, chronic kidney disease, B12 deficiency, and alcoholism who presents with transient episodes of slurred speech and apraxia  Medications: Vitamin B12, duloxetine, hydroxyzine, levothyroxine, thiamine, tramadol, indomethacin, Premarin, metoprolol, mycophenolate  Technical Summary: This is a multichannel digital EEG recording, using the international 10-20 placement system.  Spike detection software was employed.  Description: The EEG background is symmetric, with a posterior dominant rhythm of 7 to 8 Hz, which is reactive to eye opening and closing.  Diffuse background slowing of theta activity is seen.  No focal or generalized epileptiform discharges are seen.  Stage II sleep is not seen.  Hyperventilation was not performed.  Photic stimulation was performed, and produced no abnormalities.  ECG revealed normal cardiac rate and rhythm.  Impression: This is a mildly abnormal routine EEG of the awake and drowsy states, with activating procedures, due to generalized background slowing.  This is a non-specific finding and may be secondary to a metabolic, pharmacologic or diffuse physiologic abnormality.  Clinical correlation is advised.  Adam R. Tomi Likens, DO

## 2014-01-16 ENCOUNTER — Encounter: Payer: Self-pay | Admitting: *Deleted

## 2014-01-23 ENCOUNTER — Other Ambulatory Visit: Payer: Self-pay | Admitting: Family Medicine

## 2014-01-30 ENCOUNTER — Telehealth: Payer: Self-pay | Admitting: Neurology

## 2014-01-30 ENCOUNTER — Other Ambulatory Visit: Payer: Self-pay | Admitting: *Deleted

## 2014-01-30 DIAGNOSIS — R4701 Aphasia: Secondary | ICD-10-CM

## 2014-01-30 NOTE — Telephone Encounter (Signed)
Pt called, she says at last appt w/ Dr. Tomi Likens it was mentioned that he would refer her to another provider. She believes it was a provider in [place] but he was going to try to find someone locally. She has not heard back. Please call [phone] to follow up . / Sherri S.

## 2014-01-30 NOTE — Telephone Encounter (Signed)
Patient has been referred to Dr Valentina Shaggy  She is aware that it may be a month before appt is made she is ok with that

## 2014-02-02 ENCOUNTER — Other Ambulatory Visit: Payer: Self-pay | Admitting: *Deleted

## 2014-02-02 DIAGNOSIS — G459 Transient cerebral ischemic attack, unspecified: Secondary | ICD-10-CM

## 2014-02-02 NOTE — Telephone Encounter (Signed)
It is with Dr Valentina Shaggy

## 2014-02-02 NOTE — Addendum Note (Signed)
Addended by: Ella Jubilee on: 02/02/2014 10:47 AM   Modules accepted: Orders

## 2014-02-16 ENCOUNTER — Telehealth: Payer: Self-pay | Admitting: Family Medicine

## 2014-02-17 ENCOUNTER — Ambulatory Visit: Payer: BC Managed Care – PPO | Attending: Neurology | Admitting: Speech Pathology

## 2014-02-17 ENCOUNTER — Other Ambulatory Visit: Payer: Self-pay | Admitting: Family Medicine

## 2014-02-17 NOTE — Telephone Encounter (Signed)
She needs labs so would be appropriate for her to make a gout follow up

## 2014-02-18 ENCOUNTER — Ambulatory Visit: Payer: BC Managed Care – PPO | Admitting: Speech Pathology

## 2014-02-18 NOTE — Telephone Encounter (Signed)
Left message to call and schedule a follow up appt.

## 2014-02-19 ENCOUNTER — Encounter: Payer: Self-pay | Admitting: Family Medicine

## 2014-02-19 ENCOUNTER — Ambulatory Visit (INDEPENDENT_AMBULATORY_CARE_PROVIDER_SITE_OTHER): Payer: BC Managed Care – PPO | Admitting: Family Medicine

## 2014-02-19 VITALS — BP 165/83 | HR 74 | Temp 97.3°F | Ht 69.0 in | Wt 190.6 lb

## 2014-02-19 DIAGNOSIS — E559 Vitamin D deficiency, unspecified: Secondary | ICD-10-CM

## 2014-02-19 DIAGNOSIS — E538 Deficiency of other specified B group vitamins: Secondary | ICD-10-CM

## 2014-02-19 DIAGNOSIS — M109 Gout, unspecified: Secondary | ICD-10-CM

## 2014-02-19 DIAGNOSIS — E039 Hypothyroidism, unspecified: Secondary | ICD-10-CM

## 2014-02-19 DIAGNOSIS — I10 Essential (primary) hypertension: Secondary | ICD-10-CM

## 2014-02-19 LAB — POCT CBC
Granulocyte percent: 73 %G (ref 37–80)
HCT, POC: 33.1 % — AB (ref 37.7–47.9)
Hemoglobin: 10.3 g/dL — AB (ref 12.2–16.2)
Lymph, poc: 1.1 (ref 0.6–3.4)
MCH, POC: 29.5 pg (ref 27–31.2)
MCHC: 31.3 g/dL — AB (ref 31.8–35.4)
MCV: 94.3 fL (ref 80–97)
MPV: 6.8 fL (ref 0–99.8)
POC Granulocyte: 4.1 (ref 2–6.9)
POC LYMPH PERCENT: 19.6 %L (ref 10–50)
Platelet Count, POC: 293 10*3/uL (ref 142–424)
RBC: 3.5 M/uL — AB (ref 4.04–5.48)
RDW, POC: 13.1 %
WBC: 5.6 10*3/uL (ref 4.6–10.2)

## 2014-02-19 MED ORDER — INDOMETHACIN ER 75 MG PO CPCR: 75.0000 mg | ORAL_CAPSULE | Freq: Two times a day (BID) | ORAL | Status: DC

## 2014-02-19 NOTE — Progress Notes (Signed)
   Subjective:    Patient ID: [name], female    DOB: Mar 25, 1946, 68 y.o.   MRN: [id number]  HPI This 69 y.o. female presents for evaluation of gout.  She is due for labs.  She takes indocin  And allopurinol.  She has hypertension and hypothyroidism.  She has hx of vitamin B12 deficiency.   Review of Systems No chest pain, SOB, HA, dizziness, vision change, N/V, diarrhea, constipation, dysuria, urinary urgency or frequency, myalgias, arthralgias or rash.     Objective:   Physical Exam  Vital signs noted  Well developed well nourished female.  HEENT - Head atraumatic Normocephalic                Eyes - PERRLA, Conjuctiva - clear Sclera- Clear EOMI                Ears - EAC's Wnl TM's Wnl Gross Hearing WNL                Nose - Nares patent                 Throat - oropharanx wnl Respiratory - Lungs CTA bilateral Cardiac - RRR S1 and S2 without murmur GI - Abdomen soft Nontender and bowel sounds active x 4 Extremities - No edema. Neuro - Grossly intact.      Assessment & Plan:  Gout - Plan: indomethacin (INDOCIN SR) 75 MG CR capsule, Uric acid  Essential hypertension, benign - Plan: POCT CBC, CMP14+EGFR  B12 deficiency - Plan: Vitamin B12  Unspecified vitamin D deficiency - Plan: Vit D  25 hydroxy (rtn osteoporosis monitoring)  Unspecified hypothyroidism - Plan: Thyroid Panel With TSH  Lysbeth Penner FNP

## 2014-02-20 LAB — CMP14+EGFR
ALT: 3 IU/L (ref 0–32)
AST: 5 IU/L (ref 0–40)
Albumin/Globulin Ratio: 1.3 (ref 1.1–2.5)
Albumin: 3.5 g/dL — ABNORMAL LOW (ref 3.6–4.8)
Alkaline Phosphatase: 70 IU/L (ref 39–117)
BUN/Creatinine Ratio: 14 (ref 11–26)
BUN: 15 mg/dL (ref 8–27)
CO2: 22 mmol/L (ref 18–29)
Calcium: 8.4 mg/dL — ABNORMAL LOW (ref 8.7–10.3)
Chloride: 103 mmol/L (ref 97–108)
Creatinine, Ser: 1.07 mg/dL — ABNORMAL HIGH (ref 0.57–1.00)
GFR calc Af Amer: 63 mL/min/{1.73_m2} (ref >59)
GFR calc non Af Amer: 54 mL/min/{1.73_m2} — ABNORMAL LOW (ref >59)
Globulin, Total: 2.6 g/dL (ref 1.5–4.5)
Glucose: 87 mg/dL (ref 65–99)
Potassium: 3.9 mmol/L (ref 3.5–5.2)
Sodium: 139 mmol/L (ref 134–144)
Total Bilirubin: 0.2 mg/dL (ref 0.0–1.2)
Total Protein: 6.1 g/dL (ref 6.0–8.5)

## 2014-02-20 LAB — THYROID PANEL WITH TSH
Free Thyroxine Index: 1.8 (ref 1.2–4.9)
T3 Uptake Ratio: 18 % — ABNORMAL LOW (ref 24–39)
T4, Total: 10.2 ug/dL (ref 4.5–12.0)
TSH: 39.24 u[IU]/mL — ABNORMAL HIGH (ref 0.450–4.500)

## 2014-02-20 LAB — URIC ACID: Uric Acid: 4.1 mg/dL (ref 2.5–7.1)

## 2014-02-20 LAB — VITAMIN D 25 HYDROXY (VIT D DEFICIENCY, FRACTURES): Vit D, 25-Hydroxy: 11.3 ng/mL — ABNORMAL LOW (ref 30.0–100.0)

## 2014-02-20 LAB — VITAMIN B12: Vitamin B-12: 336 pg/mL (ref 211–946)

## 2014-02-23 ENCOUNTER — Other Ambulatory Visit: Payer: Self-pay | Admitting: Family Medicine

## 2014-02-23 DIAGNOSIS — E039 Hypothyroidism, unspecified: Secondary | ICD-10-CM

## 2014-02-23 DIAGNOSIS — E559 Vitamin D deficiency, unspecified: Secondary | ICD-10-CM

## 2014-02-23 MED ORDER — VITAMIN D (ERGOCALCIFEROL) 1.25 MG (50000 UNIT) PO CAPS: 50000.0000 [IU] | ORAL_CAPSULE | ORAL | Status: DC

## 2014-02-26 DIAGNOSIS — G459 Transient cerebral ischemic attack, unspecified: Secondary | ICD-10-CM

## 2014-02-26 DIAGNOSIS — R41841 Cognitive communication deficit: Secondary | ICD-10-CM

## 2014-02-27 ENCOUNTER — Other Ambulatory Visit: Payer: Self-pay | Admitting: Family Medicine

## 2014-03-03 DIAGNOSIS — G459 Transient cerebral ischemic attack, unspecified: Secondary | ICD-10-CM

## 2014-03-03 DIAGNOSIS — R413 Other amnesia: Secondary | ICD-10-CM

## 2014-03-03 DIAGNOSIS — R41841 Cognitive communication deficit: Secondary | ICD-10-CM

## 2014-03-16 ENCOUNTER — Other Ambulatory Visit: Payer: Self-pay | Admitting: Family Medicine

## 2014-04-08 ENCOUNTER — Ambulatory Visit: Payer: BC Managed Care – PPO | Admitting: Neurology

## 2014-04-08 ENCOUNTER — Telehealth: Payer: Self-pay | Admitting: *Deleted

## 2014-04-08 NOTE — Telephone Encounter (Signed)
We made a follow up appt for this patient after testing at her request today 04/08/14 however she was a no show and this was the day that she requested for the appt.

## 2014-04-09 ENCOUNTER — Ambulatory Visit: Payer: BC Managed Care – PPO | Admitting: Family Medicine

## 2014-05-07 ENCOUNTER — Telehealth: Payer: Self-pay | Admitting: Family Medicine

## 2014-05-07 NOTE — Telephone Encounter (Signed)
Med list up front patient aware

## 2014-07-07 ENCOUNTER — Other Ambulatory Visit: Payer: Self-pay | Admitting: *Deleted

## 2014-07-07 MED ORDER — LEVOTHYROXINE SODIUM 125 MCG PO TABS: 125.0000 ug | ORAL_TABLET | Freq: Every day | ORAL | Status: DC

## 2014-07-07 NOTE — Telephone Encounter (Signed)
Please review last TSH and advise on refill. Looks like she was to be referred

## 2014-07-08 ENCOUNTER — Telehealth: Payer: Self-pay | Admitting: Family Medicine

## 2014-07-09 ENCOUNTER — Other Ambulatory Visit: Payer: Self-pay | Admitting: Family Medicine

## 2014-07-09 MED ORDER — LEVOTHYROXINE SODIUM 125 MCG PO TABS: 125.0000 ug | ORAL_TABLET | Freq: Every day | ORAL | Status: DC

## 2014-07-09 NOTE — Telephone Encounter (Signed)
Patient notified

## 2014-07-09 NOTE — Telephone Encounter (Signed)
Call and tell patient rx was sent in

## 2014-09-11 ENCOUNTER — Encounter: Payer: Self-pay | Admitting: Family Medicine

## 2014-09-11 ENCOUNTER — Ambulatory Visit (INDEPENDENT_AMBULATORY_CARE_PROVIDER_SITE_OTHER): Payer: Medicare Other | Admitting: Family Medicine

## 2014-09-11 VITALS — BP 164/87 | HR 102 | Temp 98.5°F | Ht 69.0 in | Wt 180.0 lb

## 2014-09-11 DIAGNOSIS — M10052 Idiopathic gout, left hip: Secondary | ICD-10-CM | POA: Diagnosis not present

## 2014-09-11 DIAGNOSIS — I10 Essential (primary) hypertension: Secondary | ICD-10-CM | POA: Diagnosis not present

## 2014-09-11 DIAGNOSIS — E038 Other specified hypothyroidism: Secondary | ICD-10-CM

## 2014-09-11 DIAGNOSIS — N390 Urinary tract infection, site not specified: Secondary | ICD-10-CM | POA: Diagnosis not present

## 2014-09-11 DIAGNOSIS — J209 Acute bronchitis, unspecified: Secondary | ICD-10-CM

## 2014-09-11 DIAGNOSIS — M109 Gout, unspecified: Secondary | ICD-10-CM

## 2014-09-11 DIAGNOSIS — R300 Dysuria: Secondary | ICD-10-CM | POA: Diagnosis not present

## 2014-09-11 DIAGNOSIS — R319 Hematuria, unspecified: Secondary | ICD-10-CM

## 2014-09-11 DIAGNOSIS — J441 Chronic obstructive pulmonary disease with (acute) exacerbation: Secondary | ICD-10-CM

## 2014-09-11 DIAGNOSIS — J440 Chronic obstructive pulmonary disease with acute lower respiratory infection: Secondary | ICD-10-CM

## 2014-09-11 LAB — POCT UA - MICROSCOPIC ONLY
Casts, Ur, LPF, POC: NEGATIVE
Crystals, Ur, HPF, POC: NEGATIVE
Mucus, UA: NEGATIVE

## 2014-09-11 LAB — POCT URINALYSIS DIPSTICK
Bilirubin, UA: NEGATIVE
Glucose, UA: NEGATIVE
Ketones, UA: NEGATIVE
Nitrite, UA: POSITIVE
Spec Grav, UA: 1.015
Urobilinogen, UA: NEGATIVE
pH, UA: 5

## 2014-09-11 MED ORDER — VALSARTAN 320 MG PO TABS: 320.0000 mg | ORAL_TABLET | Freq: Every day | ORAL | Status: DC

## 2014-09-11 MED ORDER — ALLOPURINOL 300 MG PO TABS
300.0000 mg | ORAL_TABLET | Freq: Every day | ORAL | Status: DC
Start: 2014-09-11 — End: 2016-01-27

## 2014-09-11 MED ORDER — INDOMETHACIN ER 75 MG PO CPCR: 75.0000 mg | ORAL_CAPSULE | Freq: Two times a day (BID) | ORAL | Status: DC

## 2014-09-11 MED ORDER — BUDESONIDE-FORMOTEROL FUMARATE 160-4.5 MCG/ACT IN AERO: 2.0000 | INHALATION_SPRAY | Freq: Two times a day (BID) | RESPIRATORY_TRACT | Status: DC

## 2014-09-11 MED ORDER — LEVOTHYROXINE SODIUM 125 MCG PO TABS: 125.0000 ug | ORAL_TABLET | Freq: Every day | ORAL | Status: DC

## 2014-09-11 MED ORDER — CIPROFLOXACIN HCL 500 MG PO TABS: 500.0000 mg | ORAL_TABLET | Freq: Two times a day (BID) | ORAL | Status: DC

## 2014-09-11 NOTE — Progress Notes (Signed)
   Subjective:    Patient ID: [name], female    DOB: 21-Aug-1946, 68 y.o.   MRN: [id number]  HPI C/o follow up appointment.  She has hx of htn, CKD, ETOH abuse, hypothyroidism, hyperlipidemia, TIA's, and CKD.  She is out of meds and needs refills.  She has been off her meds.   Review of Systems    No chest pain, SOB, HA, dizziness, vision change, N/V, diarrhea, constipation, dysuria, urinary urgency or frequency, myalgias, arthralgias or rash.  Objective:   Physical Exam Vital signs noted  Well developed well nourished female.  HEENT - Head atraumatic Normocephalic                Eyes - PERRLA, Conjuctiva - clear Sclera- Clear EOMI                Ears - EAC's Wnl TM's Wnl Gross Hearing WNL                Nose - Nares patent                 Throat - oropharanx wnl Respiratory - Lungs CTA bilateral Cardiac - RRR S1 and S2 without murmur GI - Abdomen soft Nontender and bowel sounds active x 4 Extremities - No edema. Neuro - Grossly intact.      Assessment & Plan:  Dysuria - Plan: POCT UA - Microscopic Only, POCT urinalysis dipstick, ciprofloxacin (CIPRO) 500 MG tablet  Acute gout of left hip, unspecified cause - Plan: allopurinol (ZYLOPRIM) 300 MG tablet, indomethacin (INDOCIN SR) 75 MG CR capsule  Essential hypertension, benign - Plan: valsartan (DIOVAN) 320 MG tablet  Urinary tract infection with hematuria, site unspecified  Other specified hypothyroidism - Plan: levothyroxine (SYNTHROID, LEVOTHROID) 125 MCG tablet  COPD (chronic obstructive pulmonary disease) with acute bronchitis - Plan: budesonide-formoterol (SYMBICORT) 160-4.5 MCG/ACT inhaler  Follow up in 2 months   Lysbeth Penner FNP

## 2014-10-20 ENCOUNTER — Telehealth: Payer: Self-pay | Admitting: Family Medicine

## 2014-10-20 ENCOUNTER — Other Ambulatory Visit: Payer: Self-pay | Admitting: Family Medicine

## 2014-10-20 MED ORDER — DULOXETINE HCL 60 MG PO CPEP
60.0000 mg | ORAL_CAPSULE | Freq: Every day | ORAL | Status: DC
Start: 2014-10-20 — End: 2014-11-12

## 2014-10-20 NOTE — Telephone Encounter (Signed)
Patient has appointment coming up and wants to know if you could put her back on the cymbalta

## 2014-11-12 ENCOUNTER — Ambulatory Visit (INDEPENDENT_AMBULATORY_CARE_PROVIDER_SITE_OTHER): Payer: Medicare Other | Admitting: Family Medicine

## 2014-11-12 ENCOUNTER — Encounter: Payer: Self-pay | Admitting: Family Medicine

## 2014-11-12 VITALS — BP 146/89 | HR 107 | Temp 98.6°F | Ht 69.0 in | Wt 170.4 lb

## 2014-11-12 DIAGNOSIS — F329 Major depressive disorder, single episode, unspecified: Secondary | ICD-10-CM

## 2014-11-12 DIAGNOSIS — F32A Depression, unspecified: Secondary | ICD-10-CM

## 2014-11-12 DIAGNOSIS — W19XXXA Unspecified fall, initial encounter: Secondary | ICD-10-CM | POA: Diagnosis not present

## 2014-11-12 MED ORDER — DULOXETINE HCL 60 MG PO CPEP: 60.0000 mg | ORAL_CAPSULE | Freq: Every day | ORAL | Status: DC

## 2014-11-12 MED ORDER — DULOXETINE HCL 30 MG PO CPEP: 30.0000 mg | ORAL_CAPSULE | Freq: Every day | ORAL | Status: DC

## 2014-11-12 NOTE — Progress Notes (Signed)
Subjective:    Patient ID: [name], female    DOB: 04/11/1946, 68 y.o.   MRN: [id number]  HPI Patient is here for follow up.  She has hx of COPD and hypertension. She has been having problems with falls.  She has hx of tia sx's and depression.  She has back pain and arthritis pain.  She is accompanied by her son who states she is drinking more.  Review of Systems  Constitutional: Negative for fever.  HENT: Negative for ear pain.   Eyes: Negative for discharge.  Respiratory: Negative for cough.   Cardiovascular: Negative for chest pain.  Gastrointestinal: Negative for abdominal distention.  Endocrine: Negative for polyuria.  Genitourinary: Negative for difficulty urinating.  Musculoskeletal: Negative for gait problem and neck pain.  Skin: Negative for color change and rash.  Neurological: Negative for speech difficulty and headaches.  Psychiatric/Behavioral: Negative for agitation.       Objective:    BP 146/89 mmHg  Pulse 107  Temp(Src) 98.6 F (37 C) (Oral)  Ht 5' 9" (1.753 m)  Wt 170 lb 6.4 oz (77.293 kg)  BMI 25.15 kg/m2  LMP 08/21/2013 Physical Exam  Constitutional: She is oriented to person, place, and time. She appears well-developed and well-nourished.  HENT:  Head: Normocephalic and atraumatic.  Mouth/Throat: Oropharynx is clear and moist.  Eyes: Pupils are equal, round, and reactive to light.  Neck: Normal range of motion. Neck supple.  Cardiovascular: Normal rate and regular rhythm.   No murmur heard. Pulmonary/Chest: Effort normal and breath sounds normal.  Abdominal: Soft. Bowel sounds are normal. There is no tenderness.  Neurological: She is alert and oriented to person, place, and time.  Skin: Skin is warm and dry.  Psychiatric: She has a normal mood and affect.          Assessment & Plan:     ICD-9-CM ICD-10-CM   1. Depression 311 F32.9 DULoxetine (CYMBALTA) 60 MG capsule     DULoxetine (CYMBALTA) 30 MG capsule     CMP14+EGFR   Thyroid Panel With TSH     Vitamin B12     CBC With differential/Platelet     CANCELED: POCT CBC  2. Falls, initial encounter E888.9 W19.XXXA Ambulatory referral to Physical Therapy     CMP14+EGFR     Thyroid Panel With TSH     Vitamin B12     CBC With differential/Platelet     CANCELED: POCT CBC   Discussed with patient needs to quit drinking.    No Follow-up on file.   J  FNP  

## 2014-11-13 LAB — CBC WITH DIFFERENTIAL
Basophils Absolute: 0.1 10*3/uL (ref 0.0–0.2)
Basos: 1 %
Eos: 3 %
Eosinophils Absolute: 0.2 10*3/uL (ref 0.0–0.4)
HCT: 33.3 % — ABNORMAL LOW (ref 34.0–46.6)
Hemoglobin: 11 g/dL — ABNORMAL LOW (ref 11.1–15.9)
Immature Grans (Abs): 0 10*3/uL (ref 0.0–0.1)
Immature Granulocytes: 0 %
Lymphocytes Absolute: 1.9 10*3/uL (ref 0.7–3.1)
Lymphs: 29 %
MCH: 30.3 pg (ref 26.6–33.0)
MCHC: 33 g/dL (ref 31.5–35.7)
MCV: 92 fL (ref 79–97)
Monocytes Absolute: 0.6 10*3/uL (ref 0.1–0.9)
Monocytes: 9 %
Neutrophils Absolute: 3.8 10*3/uL (ref 1.4–7.0)
Neutrophils Relative %: 58 %
Platelets: 267 10*3/uL (ref 150–379)
RBC: 3.63 x10E6/uL — ABNORMAL LOW (ref 3.77–5.28)
RDW: 17 % — ABNORMAL HIGH (ref 12.3–15.4)
WBC: 6.5 10*3/uL (ref 3.4–10.8)

## 2014-11-13 LAB — CMP14+EGFR
ALT: 13 IU/L (ref 0–32)
AST: 26 IU/L (ref 0–40)
Albumin/Globulin Ratio: 1.4 (ref 1.1–2.5)
Albumin: 4.2 g/dL (ref 3.6–4.8)
Alkaline Phosphatase: 112 IU/L (ref 39–117)
BUN/Creatinine Ratio: 13 (ref 11–26)
BUN: 19 mg/dL (ref 8–27)
CO2: 19 mmol/L (ref 18–29)
Calcium: 9.4 mg/dL (ref 8.7–10.3)
Chloride: 91 mmol/L — ABNORMAL LOW (ref 97–108)
Creatinine, Ser: 1.43 mg/dL — ABNORMAL HIGH (ref 0.57–1.00)
GFR calc Af Amer: 44 mL/min/{1.73_m2} — ABNORMAL LOW (ref >59)
GFR calc non Af Amer: 38 mL/min/{1.73_m2} — ABNORMAL LOW (ref >59)
Globulin, Total: 3.1 g/dL (ref 1.5–4.5)
Glucose: 87 mg/dL (ref 65–99)
Potassium: 3.9 mmol/L (ref 3.5–5.2)
Sodium: 135 mmol/L (ref 134–144)
Total Bilirubin: 0.5 mg/dL (ref 0.0–1.2)
Total Protein: 7.3 g/dL (ref 6.0–8.5)

## 2014-11-13 LAB — THYROID PANEL WITH TSH
Free Thyroxine Index: 2.2 (ref 1.2–4.9)
T3 Uptake Ratio: 33 % (ref 24–39)
T4, Total: 6.8 ug/dL (ref 4.5–12.0)
TSH: 30.68 u[IU]/mL — ABNORMAL HIGH (ref 0.450–4.500)

## 2014-11-13 LAB — VITAMIN B12: Vitamin B-12: 307 pg/mL (ref 211–946)

## 2014-11-14 ENCOUNTER — Other Ambulatory Visit: Payer: Self-pay | Admitting: Family Medicine

## 2014-11-14 MED ORDER — LEVOTHYROXINE SODIUM 150 MCG PO TABS: 150.0000 ug | ORAL_TABLET | Freq: Every day | ORAL | Status: DC

## 2014-11-17 NOTE — Telephone Encounter (Signed)
Call us to discuss lab results. You will have to go up on your thyroid medication(script sent in) and recheck labs  in 6-8 weeks.

## 2014-11-19 ENCOUNTER — Ambulatory Visit: Payer: Medicare Other | Attending: Family Medicine | Admitting: Physical Therapy

## 2014-11-19 DIAGNOSIS — M6281 Muscle weakness (generalized): Secondary | ICD-10-CM | POA: Insufficient documentation

## 2014-11-19 DIAGNOSIS — R269 Unspecified abnormalities of gait and mobility: Secondary | ICD-10-CM | POA: Insufficient documentation

## 2014-11-24 ENCOUNTER — Ambulatory Visit: Payer: Medicare Other | Admitting: Physical Therapy

## 2014-11-24 ENCOUNTER — Encounter: Payer: Self-pay | Admitting: *Deleted

## 2014-11-24 DIAGNOSIS — M6281 Muscle weakness (generalized): Secondary | ICD-10-CM | POA: Diagnosis not present

## 2014-11-24 DIAGNOSIS — R269 Unspecified abnormalities of gait and mobility: Secondary | ICD-10-CM | POA: Diagnosis not present

## 2014-11-26 ENCOUNTER — Ambulatory Visit: Payer: Medicare Other | Admitting: Physical Therapy

## 2014-11-26 DIAGNOSIS — M6281 Muscle weakness (generalized): Secondary | ICD-10-CM | POA: Diagnosis not present

## 2014-11-26 DIAGNOSIS — R269 Unspecified abnormalities of gait and mobility: Secondary | ICD-10-CM | POA: Diagnosis not present

## 2014-11-30 ENCOUNTER — Telehealth: Payer: Self-pay | Admitting: *Deleted

## 2014-11-30 NOTE — Telephone Encounter (Signed)
Please schedule physical therapy at home due to her inability to do outpatient physical therapy

## 2014-11-30 NOTE — Telephone Encounter (Signed)
PT next door feels she is not stable of safe enough for outpatient therapy. They are dismissing her, do you want PT in home?

## 2014-12-01 NOTE — Telephone Encounter (Signed)
Sent PT referral to [facility]

## 2014-12-07 DIAGNOSIS — J449 Chronic obstructive pulmonary disease, unspecified: Secondary | ICD-10-CM | POA: Diagnosis not present

## 2014-12-07 DIAGNOSIS — Z8673 Personal history of transient ischemic attack (TIA), and cerebral infarction without residual deficits: Secondary | ICD-10-CM | POA: Diagnosis not present

## 2014-12-07 DIAGNOSIS — Z9181 History of falling: Secondary | ICD-10-CM | POA: Diagnosis not present

## 2014-12-07 DIAGNOSIS — I10 Essential (primary) hypertension: Secondary | ICD-10-CM | POA: Diagnosis not present

## 2014-12-07 DIAGNOSIS — F329 Major depressive disorder, single episode, unspecified: Secondary | ICD-10-CM | POA: Diagnosis not present

## 2014-12-07 DIAGNOSIS — M150 Primary generalized (osteo)arthritis: Secondary | ICD-10-CM | POA: Diagnosis not present

## 2014-12-10 DIAGNOSIS — J449 Chronic obstructive pulmonary disease, unspecified: Secondary | ICD-10-CM | POA: Diagnosis not present

## 2014-12-10 DIAGNOSIS — M150 Primary generalized (osteo)arthritis: Secondary | ICD-10-CM | POA: Diagnosis not present

## 2014-12-10 DIAGNOSIS — Z8673 Personal history of transient ischemic attack (TIA), and cerebral infarction without residual deficits: Secondary | ICD-10-CM | POA: Diagnosis not present

## 2014-12-10 DIAGNOSIS — F329 Major depressive disorder, single episode, unspecified: Secondary | ICD-10-CM | POA: Diagnosis not present

## 2014-12-10 DIAGNOSIS — I10 Essential (primary) hypertension: Secondary | ICD-10-CM | POA: Diagnosis not present

## 2014-12-10 DIAGNOSIS — Z9181 History of falling: Secondary | ICD-10-CM | POA: Diagnosis not present

## 2014-12-14 DIAGNOSIS — M150 Primary generalized (osteo)arthritis: Secondary | ICD-10-CM | POA: Diagnosis not present

## 2014-12-14 DIAGNOSIS — F329 Major depressive disorder, single episode, unspecified: Secondary | ICD-10-CM | POA: Diagnosis not present

## 2014-12-14 DIAGNOSIS — I10 Essential (primary) hypertension: Secondary | ICD-10-CM | POA: Diagnosis not present

## 2014-12-14 DIAGNOSIS — Z8673 Personal history of transient ischemic attack (TIA), and cerebral infarction without residual deficits: Secondary | ICD-10-CM | POA: Diagnosis not present

## 2014-12-14 DIAGNOSIS — Z9181 History of falling: Secondary | ICD-10-CM | POA: Diagnosis not present

## 2014-12-14 DIAGNOSIS — J449 Chronic obstructive pulmonary disease, unspecified: Secondary | ICD-10-CM | POA: Diagnosis not present

## 2014-12-16 ENCOUNTER — Encounter: Payer: Self-pay | Admitting: Family Medicine

## 2014-12-16 ENCOUNTER — Ambulatory Visit (INDEPENDENT_AMBULATORY_CARE_PROVIDER_SITE_OTHER): Payer: Medicare Other | Admitting: Family Medicine

## 2014-12-16 VITALS — BP 137/65 | HR 129 | Temp 97.0°F | Ht 69.0 in | Wt 165.0 lb

## 2014-12-16 DIAGNOSIS — Z8673 Personal history of transient ischemic attack (TIA), and cerebral infarction without residual deficits: Secondary | ICD-10-CM | POA: Diagnosis not present

## 2014-12-16 DIAGNOSIS — J206 Acute bronchitis due to rhinovirus: Secondary | ICD-10-CM | POA: Diagnosis not present

## 2014-12-16 DIAGNOSIS — F329 Major depressive disorder, single episode, unspecified: Secondary | ICD-10-CM | POA: Diagnosis not present

## 2014-12-16 DIAGNOSIS — I10 Essential (primary) hypertension: Secondary | ICD-10-CM | POA: Diagnosis not present

## 2014-12-16 DIAGNOSIS — J449 Chronic obstructive pulmonary disease, unspecified: Secondary | ICD-10-CM | POA: Diagnosis not present

## 2014-12-16 DIAGNOSIS — Z9181 History of falling: Secondary | ICD-10-CM | POA: Diagnosis not present

## 2014-12-16 DIAGNOSIS — M150 Primary generalized (osteo)arthritis: Secondary | ICD-10-CM | POA: Diagnosis not present

## 2014-12-16 MED ORDER — METHYLPREDNISOLONE (PAK) 4 MG PO TABS: ORAL_TABLET | ORAL | Status: DC

## 2014-12-16 MED ORDER — HYDROCODONE-HOMATROPINE 5-1.5 MG/5ML PO SYRP: 5.0000 mL | ORAL_SOLUTION | Freq: Three times a day (TID) | ORAL | Status: DC | PRN

## 2014-12-16 MED ORDER — AZITHROMYCIN 250 MG PO TABS
ORAL_TABLET | ORAL | Status: DC
Start: 2014-12-16 — End: 2015-04-29

## 2014-12-16 NOTE — Progress Notes (Signed)
   Subjective:    Patient ID: [name], female    DOB: Jun 26, 1946, 69 y.o.   MRN: [id number]  HPI Patient c/o cough and uri sx's  Review of Systems  Constitutional: Negative for fever.  HENT: Negative for ear pain.   Eyes: Negative for discharge.  Respiratory: Negative for cough.   Cardiovascular: Negative for chest pain.  Gastrointestinal: Negative for abdominal distention.  Endocrine: Negative for polyuria.  Genitourinary: Negative for difficulty urinating.  Musculoskeletal: Negative for gait problem and neck pain.  Skin: Negative for color change and rash.  Neurological: Negative for speech difficulty and headaches.  Psychiatric/Behavioral: Negative for agitation.       Objective:    BP 137/65 mmHg  Pulse 129  Temp(Src) 97 F (36.1 C) (Oral)  Ht 5\' 9"  (1.753 m)  Wt 165 lb (74.844 kg)  BMI 24.36 kg/m2  LMP 08/21/2013 Physical Exam  Constitutional: She is oriented to person, place, and time. She appears well-developed and well-nourished.  HENT:  Head: Normocephalic and atraumatic.  Mouth/Throat: Oropharynx is clear and moist.  Eyes: Pupils are equal, round, and reactive to light.  Neck: Normal range of motion. Neck supple.  Cardiovascular: Normal rate and regular rhythm.   No murmur heard. Pulmonary/Chest: Effort normal and breath sounds normal.  Abdominal: Soft. Bowel sounds are normal. There is no tenderness.  Neurological: She is alert and oriented to person, place, and time.  Skin: Skin is warm and dry.  Psychiatric: She has a normal mood and affect.          Assessment & Plan:     ICD-9-CM ICD-10-CM   1. Acute bronchitis due to Rhinovirus 466.0 J20.6 azithromycin (ZITHROMAX) 250 MG tablet   079.3  methylPREDNIsolone (MEDROL DOSPACK) 4 MG tablet     HYDROcodone-homatropine (HYCODAN) 5-1.5 MG/5ML syrup   Push po fluids, rest, tylenol and motrin otc prn as directed for fever, arthralgias, and myalgias.  Follow up prn if sx's continue or  persist.  Return if symptoms worsen or fail to improve.  Lysbeth Penner FNP

## 2014-12-18 ENCOUNTER — Telehealth: Payer: Self-pay | Admitting: Family Medicine

## 2014-12-18 DIAGNOSIS — J449 Chronic obstructive pulmonary disease, unspecified: Secondary | ICD-10-CM | POA: Diagnosis not present

## 2014-12-18 DIAGNOSIS — I10 Essential (primary) hypertension: Secondary | ICD-10-CM | POA: Diagnosis not present

## 2014-12-18 DIAGNOSIS — F329 Major depressive disorder, single episode, unspecified: Secondary | ICD-10-CM | POA: Diagnosis not present

## 2014-12-18 DIAGNOSIS — Z8673 Personal history of transient ischemic attack (TIA), and cerebral infarction without residual deficits: Secondary | ICD-10-CM | POA: Diagnosis not present

## 2014-12-18 DIAGNOSIS — Z9181 History of falling: Secondary | ICD-10-CM | POA: Diagnosis not present

## 2014-12-18 DIAGNOSIS — M150 Primary generalized (osteo)arthritis: Secondary | ICD-10-CM | POA: Diagnosis not present

## 2014-12-18 NOTE — Telephone Encounter (Signed)
Patient advised that she is on valsartan, lopressor, and furosemide. Patient aware to monitor her BP and if it continues to run elevated that we may need to re evaluate her.

## 2014-12-22 DIAGNOSIS — J449 Chronic obstructive pulmonary disease, unspecified: Secondary | ICD-10-CM | POA: Diagnosis not present

## 2014-12-22 DIAGNOSIS — I10 Essential (primary) hypertension: Secondary | ICD-10-CM | POA: Diagnosis not present

## 2014-12-22 DIAGNOSIS — Z8673 Personal history of transient ischemic attack (TIA), and cerebral infarction without residual deficits: Secondary | ICD-10-CM | POA: Diagnosis not present

## 2014-12-22 DIAGNOSIS — M150 Primary generalized (osteo)arthritis: Secondary | ICD-10-CM | POA: Diagnosis not present

## 2014-12-22 DIAGNOSIS — Z9181 History of falling: Secondary | ICD-10-CM | POA: Diagnosis not present

## 2014-12-22 DIAGNOSIS — F329 Major depressive disorder, single episode, unspecified: Secondary | ICD-10-CM | POA: Diagnosis not present

## 2014-12-24 DIAGNOSIS — J449 Chronic obstructive pulmonary disease, unspecified: Secondary | ICD-10-CM | POA: Diagnosis not present

## 2014-12-24 DIAGNOSIS — Z8673 Personal history of transient ischemic attack (TIA), and cerebral infarction without residual deficits: Secondary | ICD-10-CM | POA: Diagnosis not present

## 2014-12-24 DIAGNOSIS — Z9181 History of falling: Secondary | ICD-10-CM | POA: Diagnosis not present

## 2014-12-24 DIAGNOSIS — M150 Primary generalized (osteo)arthritis: Secondary | ICD-10-CM | POA: Diagnosis not present

## 2014-12-24 DIAGNOSIS — I10 Essential (primary) hypertension: Secondary | ICD-10-CM | POA: Diagnosis not present

## 2014-12-24 DIAGNOSIS — F329 Major depressive disorder, single episode, unspecified: Secondary | ICD-10-CM | POA: Diagnosis not present

## 2014-12-29 DIAGNOSIS — Z9181 History of falling: Secondary | ICD-10-CM | POA: Diagnosis not present

## 2014-12-29 DIAGNOSIS — I10 Essential (primary) hypertension: Secondary | ICD-10-CM | POA: Diagnosis not present

## 2014-12-29 DIAGNOSIS — M150 Primary generalized (osteo)arthritis: Secondary | ICD-10-CM | POA: Diagnosis not present

## 2014-12-29 DIAGNOSIS — F329 Major depressive disorder, single episode, unspecified: Secondary | ICD-10-CM | POA: Diagnosis not present

## 2014-12-29 DIAGNOSIS — Z8673 Personal history of transient ischemic attack (TIA), and cerebral infarction without residual deficits: Secondary | ICD-10-CM | POA: Diagnosis not present

## 2014-12-29 DIAGNOSIS — J449 Chronic obstructive pulmonary disease, unspecified: Secondary | ICD-10-CM | POA: Diagnosis not present

## 2015-01-08 ENCOUNTER — Ambulatory Visit: Payer: Medicare Other | Admitting: Family Medicine

## 2015-01-08 DIAGNOSIS — Z8673 Personal history of transient ischemic attack (TIA), and cerebral infarction without residual deficits: Secondary | ICD-10-CM | POA: Diagnosis not present

## 2015-01-08 DIAGNOSIS — I10 Essential (primary) hypertension: Secondary | ICD-10-CM | POA: Diagnosis not present

## 2015-01-08 DIAGNOSIS — J449 Chronic obstructive pulmonary disease, unspecified: Secondary | ICD-10-CM | POA: Diagnosis not present

## 2015-01-08 DIAGNOSIS — F329 Major depressive disorder, single episode, unspecified: Secondary | ICD-10-CM | POA: Diagnosis not present

## 2015-04-22 ENCOUNTER — Encounter (HOSPITAL_COMMUNITY): Payer: Self-pay | Admitting: *Deleted

## 2015-04-22 ENCOUNTER — Emergency Department (HOSPITAL_COMMUNITY): Payer: Medicare Other

## 2015-04-22 ENCOUNTER — Inpatient Hospital Stay (HOSPITAL_COMMUNITY)
Admission: EM | Admit: 2015-04-22 | Discharge: 2015-04-29 | DRG: 091 | Disposition: A | Discharge status: Skilled Nursing Facility | Payer: Medicare Other | Attending: Internal Medicine | Admitting: Internal Medicine

## 2015-04-22 DIAGNOSIS — I674 Hypertensive encephalopathy: Secondary | ICD-10-CM | POA: Diagnosis present

## 2015-04-22 DIAGNOSIS — E0591 Thyrotoxicosis, unspecified with thyrotoxic crisis or storm: Secondary | ICD-10-CM | POA: Diagnosis present

## 2015-04-22 DIAGNOSIS — M109 Gout, unspecified: Secondary | ICD-10-CM

## 2015-04-22 DIAGNOSIS — F0390 Unspecified dementia without behavioral disturbance: Secondary | ICD-10-CM | POA: Diagnosis present

## 2015-04-22 DIAGNOSIS — F329 Major depressive disorder, single episode, unspecified: Secondary | ICD-10-CM | POA: Diagnosis present

## 2015-04-22 DIAGNOSIS — R4182 Altered mental status, unspecified: Secondary | ICD-10-CM | POA: Diagnosis not present

## 2015-04-22 DIAGNOSIS — G9340 Encephalopathy, unspecified: Secondary | ICD-10-CM | POA: Diagnosis present

## 2015-04-22 DIAGNOSIS — D509 Iron deficiency anemia, unspecified: Secondary | ICD-10-CM | POA: Diagnosis present

## 2015-04-22 DIAGNOSIS — F10931 Alcohol use, unspecified with withdrawal delirium: Secondary | ICD-10-CM | POA: Diagnosis present

## 2015-04-22 DIAGNOSIS — I129 Hypertensive chronic kidney disease with stage 1 through stage 4 chronic kidney disease, or unspecified chronic kidney disease: Secondary | ICD-10-CM | POA: Diagnosis present

## 2015-04-22 DIAGNOSIS — G92 Toxic encephalopathy: Principal | ICD-10-CM | POA: Diagnosis present

## 2015-04-22 DIAGNOSIS — Z9841 Cataract extraction status, right eye: Secondary | ICD-10-CM

## 2015-04-22 DIAGNOSIS — Z888 Allergy status to other drugs, medicaments and biological substances status: Secondary | ICD-10-CM

## 2015-04-22 DIAGNOSIS — E860 Dehydration: Secondary | ICD-10-CM | POA: Diagnosis present

## 2015-04-22 DIAGNOSIS — J449 Chronic obstructive pulmonary disease, unspecified: Secondary | ICD-10-CM | POA: Diagnosis present

## 2015-04-22 DIAGNOSIS — R4781 Slurred speech: Secondary | ICD-10-CM | POA: Diagnosis present

## 2015-04-22 DIAGNOSIS — Z9842 Cataract extraction status, left eye: Secondary | ICD-10-CM

## 2015-04-22 DIAGNOSIS — E876 Hypokalemia: Secondary | ICD-10-CM | POA: Diagnosis present

## 2015-04-22 DIAGNOSIS — N182 Chronic kidney disease, stage 2 (mild): Secondary | ICD-10-CM | POA: Diagnosis present

## 2015-04-22 DIAGNOSIS — I161 Hypertensive emergency: Secondary | ICD-10-CM | POA: Diagnosis present

## 2015-04-22 DIAGNOSIS — F32A Depression, unspecified: Secondary | ICD-10-CM

## 2015-04-22 DIAGNOSIS — R40241 Glasgow coma scale score 13-15: Secondary | ICD-10-CM | POA: Diagnosis not present

## 2015-04-22 DIAGNOSIS — F419 Anxiety disorder, unspecified: Secondary | ICD-10-CM | POA: Diagnosis present

## 2015-04-22 DIAGNOSIS — E039 Hypothyroidism, unspecified: Secondary | ICD-10-CM | POA: Diagnosis present

## 2015-04-22 DIAGNOSIS — Z7982 Long term (current) use of aspirin: Secondary | ICD-10-CM

## 2015-04-22 DIAGNOSIS — Z8673 Personal history of transient ischemic attack (TIA), and cerebral infarction without residual deficits: Secondary | ICD-10-CM

## 2015-04-22 DIAGNOSIS — Z961 Presence of intraocular lens: Secondary | ICD-10-CM | POA: Diagnosis present

## 2015-04-22 DIAGNOSIS — I160 Hypertensive urgency: Secondary | ICD-10-CM | POA: Insufficient documentation

## 2015-04-22 DIAGNOSIS — I5032 Chronic diastolic (congestive) heart failure: Secondary | ICD-10-CM | POA: Diagnosis present

## 2015-04-22 DIAGNOSIS — E785 Hyperlipidemia, unspecified: Secondary | ICD-10-CM | POA: Diagnosis present

## 2015-04-22 DIAGNOSIS — Z87891 Personal history of nicotine dependence: Secondary | ICD-10-CM

## 2015-04-22 DIAGNOSIS — F10231 Alcohol dependence with withdrawal delirium: Secondary | ICD-10-CM | POA: Diagnosis present

## 2015-04-22 DIAGNOSIS — E538 Deficiency of other specified B group vitamins: Secondary | ICD-10-CM | POA: Diagnosis present

## 2015-04-22 DIAGNOSIS — N390 Urinary tract infection, site not specified: Secondary | ICD-10-CM | POA: Diagnosis present

## 2015-04-22 LAB — COMPREHENSIVE METABOLIC PANEL
ALT: 11 U/L — ABNORMAL LOW (ref 14–54)
AST: 15 U/L (ref 15–41)
Albumin: 4 g/dL (ref 3.5–5.0)
Alkaline Phosphatase: 123 U/L (ref 38–126)
Anion gap: 14 (ref 5–15)
BUN: 11 mg/dL (ref 6–20)
CO2: 22 mmol/L (ref 22–32)
Calcium: 9.5 mg/dL (ref 8.9–10.3)
Chloride: 98 mmol/L — ABNORMAL LOW (ref 101–111)
Creatinine, Ser: 0.96 mg/dL (ref 0.44–1.00)
GFR calc Af Amer: >60 mL/min (ref >60)
GFR calc non Af Amer: 60 mL/min — ABNORMAL LOW (ref >60)
Glucose, Bld: 128 mg/dL — ABNORMAL HIGH (ref 65–99)
Potassium: 3.3 mmol/L — ABNORMAL LOW (ref 3.5–5.1)
Sodium: 134 mmol/L — ABNORMAL LOW (ref 135–145)
Total Bilirubin: 1.1 mg/dL (ref 0.3–1.2)
Total Protein: 7.2 g/dL (ref 6.5–8.1)

## 2015-04-22 LAB — CBC
HCT: 36.6 % (ref 36.0–46.0)
Hemoglobin: 11.5 g/dL — ABNORMAL LOW (ref 12.0–15.0)
MCH: 25.1 pg — ABNORMAL LOW (ref 26.0–34.0)
MCHC: 31.4 g/dL (ref 30.0–36.0)
MCV: 79.9 fL (ref 78.0–100.0)
Platelets: 318 10*3/uL (ref 150–400)
RBC: 4.58 MIL/uL (ref 3.87–5.11)
RDW: 15 % (ref 11.5–15.5)
WBC: 8.5 10*3/uL (ref 4.0–10.5)

## 2015-04-22 LAB — T4, FREE: Free T4: 3.59 ng/dL — ABNORMAL HIGH (ref 0.61–1.12)

## 2015-04-22 LAB — TSH: TSH: 0.074 u[IU]/mL — ABNORMAL LOW (ref 0.350–4.500)

## 2015-04-22 LAB — CBG MONITORING, ED: Glucose-Capillary: 119 mg/dL — ABNORMAL HIGH (ref 65–99)

## 2015-04-22 MED ORDER — LORAZEPAM 2 MG/ML IJ SOLN
0.0000 mg | Freq: Two times a day (BID) | INTRAMUSCULAR | Status: DC
  Administered 2015-04-22: 1 mg via INTRAVENOUS
  Filled 2015-04-22: qty 1

## 2015-04-22 MED ORDER — LORAZEPAM 1 MG PO TABS
0.0000 mg | ORAL_TABLET | Freq: Four times a day (QID) | ORAL | Status: DC
  Filled 2015-04-22: qty 1

## 2015-04-22 MED ORDER — LORAZEPAM 2 MG/ML IJ SOLN
0.0000 mg | Freq: Four times a day (QID) | INTRAMUSCULAR | Status: DC
  Administered 2015-04-23: 2 mg via INTRAVENOUS
  Filled 2015-04-22: qty 1

## 2015-04-22 MED ORDER — LABETALOL HCL 5 MG/ML IV SOLN
5.0000 mg | Freq: Once | INTRAVENOUS | Status: AC
  Administered 2015-04-22: 5 mg via INTRAVENOUS
  Filled 2015-04-22: qty 4

## 2015-04-22 MED ORDER — THIAMINE HCL 100 MG/ML IJ SOLN
100.0000 mg | Freq: Once | INTRAMUSCULAR | Status: AC
  Administered 2015-04-22: 100 mg via INTRAVENOUS
  Filled 2015-04-22: qty 2

## 2015-04-22 MED ORDER — LORAZEPAM 1 MG PO TABS: 0.0000 mg | ORAL_TABLET | Freq: Two times a day (BID) | ORAL | Status: DC

## 2015-04-22 NOTE — ED Notes (Signed)
Pt arrives from home via [facility]. Pt has altered mental status with onset of slurred speech that has resolved. Pt has a hx of TIA's with a hx of stroke 10/22/13. Equal extremity grips bilaterally. No facial drooping. CBG120 BP226/125 HR low 100's. Last known well 1915.

## 2015-04-22 NOTE — ED Provider Notes (Signed)
CSN: [id number]     Arrival date & time 04/22/15  2042 History   First MD Initiated Contact with Patient 04/22/15 2051     Chief Complaint  Patient presents with  . Altered Mental Status     (Consider location/radiation/quality/duration/timing/severity/associated sxs/prior Treatment) HPI Comments: 69 year old female brought in for altered mental status. Per son's report bedside she's been waxing and waning with her mental status for several days. Tonight she was found in the kitchen very confused, more so than usual. EMS initially reported that she had some slurred speech associated with this however sign bedside states this was not case. Per son's report she does have a history of dementia although not documented.  Patient is a 69 y.o. female presenting with altered mental status.  Altered Mental Status Presenting symptoms: behavior changes   Severity:  Moderate Most recent episode: Several days. Timing:  Intermittent Progression:  Waxing and waning Chronicity:  New Context: dementia (Per son has history of dementia)     Past Medical History  Diagnosis Date  . Hypertension   . Menopause   . Hypothyroid   . Mini stroke   . Hyperlipidemia   . B12 deficiency   . Shortness of breath   . Headache(784.0)   . Anxiety   . Pneumonia   . Anemia   . Depression   . Stroke 2010    no deficits  . Alcoholism 01/09/2012  . Hyponatremia 01/09/2012  . Chronic kidney disease (CKD), stage II (mild)   . COPD (chronic obstructive pulmonary disease)   . Orthostatic syncope 10/02/2012  . Carotid artery narrowing 10/03/2012    On the right.   Past Surgical History  Procedure Laterality Date  . Appendectomy    . Tonsillectomy    . Cataract extraction w/phaco  06/20/2012    Procedure: CATARACT EXTRACTION PHACO AND INTRAOCULAR LENS PLACEMENT (IOC);  Surgeon: Tonny Branch, MD;  Location: [facility];  Service: Ophthalmology;  Laterality: Right;  CDE:10.66  . Cataract extraction w/phaco  07/11/2012     Procedure: CATARACT EXTRACTION PHACO AND INTRAOCULAR LENS PLACEMENT (IOC);  Surgeon: Tonny Branch, MD;  Location: [facility];  Service: Ophthalmology;  Laterality: Left;  CDE: 12.69   Family History  Problem Relation Age of Onset  . GER disease Mother   . Hypertension Maternal Grandmother    History  Substance Use Topics  . Smoking status: Former Smoker -- 2.00 packs/day for 30 years    Types: Cigarettes    Quit date: 06/17/1992  . Smokeless tobacco: Not on file  . Alcohol Use: 7.0 oz/week    14 drink(s) per week   OB History    No data available     Review of Systems  Unable to perform ROS: Mental status change      Allergies  Norvasc and Procardia  Home Medications   Prior to Admission medications   Medication Sig Start Date End Date Taking? Authorizing Provider  acetaminophen (TYLENOL) 500 MG tablet Take 500 mg by mouth every 6 (six) hours as needed for moderate pain. Pain   Yes Historical Provider, MD  allopurinol (ZYLOPRIM) 300 MG tablet Take 1 tablet (300 mg total) by mouth daily. 09/11/14  Yes Lysbeth Penner, FNP  aspirin 325 MG tablet Take 1 tablet (325 mg total) by mouth daily. 10/22/13  Yes Velvet Bathe, MD  DULoxetine (CYMBALTA) 60 MG capsule Take 1 capsule (60 mg total) by mouth daily. 11/12/14  Yes Lysbeth Penner, FNP  folic acid (FOLVITE) 1 MG tablet  Take 1 tablet (1 mg total) by mouth daily. 10/28/13  Yes Lysbeth Penner, FNP  furosemide (LASIX) 40 MG tablet Take 1 tablet (40 mg total) by mouth daily. 07/29/13  Yes Chipper Herb, MD  indomethacin (INDOCIN SR) 75 MG CR capsule Take 1 capsule (75 mg total) by mouth 2 (two) times daily with a meal. Patient taking differently: Take 75 mg by mouth as needed (for gout).  09/11/14  Yes Lysbeth Penner, FNP  levothyroxine (SYNTHROID, LEVOTHROID) 125 MCG tablet Take 125 mcg by mouth daily before breakfast.   Yes Historical Provider, MD  medroxyPROGESTERone (PROVERA) 2.5 MG tablet Take 2.5 mg by mouth daily.  06/11/13  Yes  Historical Provider, MD  metoprolol (LOPRESSOR) 50 MG tablet Take 1 tablet (50 mg total) by mouth 2 (two) times daily. 01/09/14  Yes Lysbeth Penner, FNP  PREMARIN 1.25 MG tablet Take 1.25 mg by mouth daily.  06/11/13  Yes Historical Provider, MD  valsartan (DIOVAN) 320 MG tablet Take 1 tablet (320 mg total) by mouth daily. 09/11/14  Yes Lysbeth Penner, FNP  azithromycin (ZITHROMAX) 250 MG tablet Take 2 po first day and then one po qd x 4 days 12/16/14   Lysbeth Penner, FNP  budesonide-formoterol [facility]) 160-4.5 MCG/ACT inhaler Inhale 2 puffs into the lungs 2 (two) times daily. 09/11/14   Lysbeth Penner, FNP  DULoxetine (CYMBALTA) 30 MG capsule Take 1 capsule (30 mg total) by mouth daily. 11/12/14   Lysbeth Penner, FNP  thiamine (VITAMIN B-1) 100 MG tablet Take 1 tablet (100 mg total) by mouth daily. 10/28/13   Lysbeth Penner, FNP   BP 217/103 mmHg  Pulse 101  Temp(Src) 98.9 F (37.2 C) (Oral)  Ht 5\' 10"  (1.778 m)  Wt 168 lb (76.204 kg)  BMI 24.11 kg/m2  SpO2 100%  LMP 08/21/2013 Physical Exam  Constitutional: She appears well-developed.  HENT:  Head: Normocephalic and atraumatic.  Eyes: EOM are normal.  Neck: Normal range of motion.  Cardiovascular: Intact distal pulses.   Normal rate and rhythm on my exam  Pulmonary/Chest: No respiratory distress.  Sat 100% room air  Abdominal: Soft. She exhibits no distension. There is no tenderness.  Musculoskeletal: She exhibits no tenderness.  Neurological:  During exam mental status waxing and waning GCS 13-15. At times is alert and was oriented also had moments where she was disoriented and not following directions. No gross cranial nerve deficits. No abnormal tone. No motor or sensory dysfunction apparent  Skin: Skin is warm. No rash noted.  Psychiatric:  Unable to assess  Vitals reviewed.   ED Course  Procedures (including critical care time) Labs Review Labs Reviewed  CBC - Abnormal; Notable for the following:     Hemoglobin 11.5 (*)    MCH 25.1 (*)    All other components within normal limits  COMPREHENSIVE METABOLIC PANEL - Abnormal; Notable for the following:    Sodium 134 (*)    Potassium 3.3 (*)    Chloride 98 (*)    Glucose, Bld 128 (*)    ALT 11 (*)    GFR calc non Af Amer 60 (*)    All other components within normal limits  URINALYSIS, ROUTINE W REFLEX MICROSCOPIC - Abnormal; Notable for the following:    APPearance TURBID (*)    Ketones, ur 15 (*)    Nitrite POSITIVE (*)    Leukocytes, UA SMALL (*)    All other components within normal limits  TSH - Abnormal;  Notable for the following:    TSH 0.074 (*)    All other components within normal limits  T4, FREE - Abnormal; Notable for the following:    Free T4 3.59 (*)    All other components within normal limits  URINE MICROSCOPIC-ADD ON - Abnormal; Notable for the following:    Bacteria, UA FEW (*)    All other components within normal limits  CBG MONITORING, ED - Abnormal; Notable for the following:    Glucose-Capillary 119 (*)    All other components within normal limits  GRAM STAIN  ETHANOL  URINE RAPID DRUG SCREEN (HOSP PERFORMED)  URINALYSIS, ROUTINE W REFLEX MICROSCOPIC    Imaging Review Ct Head Wo Contrast  04/22/2015   CLINICAL DATA:  Altered mental status.  Slurred speech, resolved.  EXAM: CT HEAD WITHOUT CONTRAST  TECHNIQUE: Contiguous axial images were obtained from the base of the skull through the vertex without intravenous contrast.  COMPARISON:  10/21/2013, 10/02/2012  FINDINGS: There is no intracranial hemorrhage, mass or evidence of acute infarction. There is severe generalized atrophy. There is severe chronic microvascular ischemic change. There is no significant extra-axial fluid collection. Brainstem and posterior fossa appear unremarkable.  No acute intracranial findings are evident.  IMPRESSION: Severe atrophy and chronic microvascular changes. No acute intracranial findings.   Electronically Signed   By: Andreas Newport M.D.   On: 04/22/2015 22:20     EKG Interpretation   Date/Time:  Thursday Apr 22 2015 20:55:52 EDT Ventricular Rate:  102 PR Interval:  183 QRS Duration: 90 QT Interval:  374 QTC Calculation: 487 R Axis:   45 Text Interpretation:  Sinus tachycardia Abnormal R-wave progression, early  transition Borderline prolonged QT interval When compared with ECG of  10/21/2013, QT has lengthened Premature ventricular complexes are no  longer Present ABNORMAL R WAVE PROGRESSION is now Present - possibly  related to lead placement Confirmed by [facility]  MD, DAVID ([id number]) on  04/22/2015 8:59:59 PM      MDM   69 year old female history of alcohol abuse comes in with intermittent confusion and waxing and waning mental status. Patient's neuro exam is nonfocal without any focal deficits. Initially there was some concern for TIA versus CVA however there are no localizing or CVA/TIA like symptoms. Patient appears to be altered for other reasons. On arrival patient is hypertensive however afebrile. Initially treated with labetalol. Hydralazine added. Concern for infectious, hypertensive encephalopathy, thyroid storm, Wernicke's encephalopathy. With concern for possible Wernicke's encephalopathy was given thymine. Although clinically does not have all features of this. Was placed on [place] as she is a heavy drinker. UA did show nitrites and small excised. Given Rocephin. Patient also with noted depressed TSH and elevated T4 and setting taking Synthroid. Likely iatrogenic. Head CT within normal limits. This point patient is altered with multiple possible etiologies. We'll admit to hospitalist for continued evaluation.  Final diagnoses:  Altered mental status, unspecified altered mental status type        Robynn Pane, MD 02/58/52 [id number]  Delora Fuel, MD 42/35/36 1443

## 2015-04-23 DIAGNOSIS — Z9841 Cataract extraction status, right eye: Secondary | ICD-10-CM | POA: Diagnosis not present

## 2015-04-23 DIAGNOSIS — E0591 Thyrotoxicosis, unspecified with thyrotoxic crisis or storm: Secondary | ICD-10-CM | POA: Diagnosis present

## 2015-04-23 DIAGNOSIS — G92 Toxic encephalopathy: Secondary | ICD-10-CM | POA: Diagnosis present

## 2015-04-23 DIAGNOSIS — Z8673 Personal history of transient ischemic attack (TIA), and cerebral infarction without residual deficits: Secondary | ICD-10-CM | POA: Diagnosis not present

## 2015-04-23 DIAGNOSIS — I674 Hypertensive encephalopathy: Secondary | ICD-10-CM | POA: Diagnosis present

## 2015-04-23 DIAGNOSIS — N390 Urinary tract infection, site not specified: Secondary | ICD-10-CM | POA: Diagnosis present

## 2015-04-23 DIAGNOSIS — F10231 Alcohol dependence with withdrawal delirium: Secondary | ICD-10-CM | POA: Diagnosis present

## 2015-04-23 DIAGNOSIS — N182 Chronic kidney disease, stage 2 (mild): Secondary | ICD-10-CM | POA: Diagnosis present

## 2015-04-23 DIAGNOSIS — Z888 Allergy status to other drugs, medicaments and biological substances status: Secondary | ICD-10-CM | POA: Diagnosis not present

## 2015-04-23 DIAGNOSIS — E876 Hypokalemia: Secondary | ICD-10-CM | POA: Diagnosis present

## 2015-04-23 DIAGNOSIS — R4781 Slurred speech: Secondary | ICD-10-CM | POA: Diagnosis present

## 2015-04-23 DIAGNOSIS — E785 Hyperlipidemia, unspecified: Secondary | ICD-10-CM | POA: Diagnosis present

## 2015-04-23 DIAGNOSIS — Z961 Presence of intraocular lens: Secondary | ICD-10-CM | POA: Diagnosis present

## 2015-04-23 DIAGNOSIS — G9340 Encephalopathy, unspecified: Secondary | ICD-10-CM | POA: Diagnosis present

## 2015-04-23 DIAGNOSIS — F0390 Unspecified dementia without behavioral disturbance: Secondary | ICD-10-CM | POA: Diagnosis present

## 2015-04-23 DIAGNOSIS — Z87891 Personal history of nicotine dependence: Secondary | ICD-10-CM | POA: Diagnosis not present

## 2015-04-23 DIAGNOSIS — D509 Iron deficiency anemia, unspecified: Secondary | ICD-10-CM | POA: Diagnosis present

## 2015-04-23 DIAGNOSIS — F329 Major depressive disorder, single episode, unspecified: Secondary | ICD-10-CM | POA: Diagnosis present

## 2015-04-23 DIAGNOSIS — Z7982 Long term (current) use of aspirin: Secondary | ICD-10-CM | POA: Diagnosis not present

## 2015-04-23 DIAGNOSIS — E860 Dehydration: Secondary | ICD-10-CM | POA: Diagnosis present

## 2015-04-23 DIAGNOSIS — I10 Essential (primary) hypertension: Secondary | ICD-10-CM | POA: Diagnosis not present

## 2015-04-23 DIAGNOSIS — E039 Hypothyroidism, unspecified: Secondary | ICD-10-CM | POA: Diagnosis present

## 2015-04-23 DIAGNOSIS — I161 Hypertensive emergency: Secondary | ICD-10-CM | POA: Diagnosis present

## 2015-04-23 DIAGNOSIS — F419 Anxiety disorder, unspecified: Secondary | ICD-10-CM | POA: Diagnosis present

## 2015-04-23 DIAGNOSIS — I5032 Chronic diastolic (congestive) heart failure: Secondary | ICD-10-CM | POA: Diagnosis present

## 2015-04-23 DIAGNOSIS — E538 Deficiency of other specified B group vitamins: Secondary | ICD-10-CM | POA: Diagnosis present

## 2015-04-23 DIAGNOSIS — F10931 Alcohol use, unspecified with withdrawal delirium: Secondary | ICD-10-CM | POA: Diagnosis present

## 2015-04-23 DIAGNOSIS — R4182 Altered mental status, unspecified: Secondary | ICD-10-CM | POA: Insufficient documentation

## 2015-04-23 DIAGNOSIS — Z9842 Cataract extraction status, left eye: Secondary | ICD-10-CM | POA: Diagnosis not present

## 2015-04-23 DIAGNOSIS — I6782 Cerebral ischemia: Secondary | ICD-10-CM | POA: Diagnosis not present

## 2015-04-23 DIAGNOSIS — J449 Chronic obstructive pulmonary disease, unspecified: Secondary | ICD-10-CM | POA: Diagnosis present

## 2015-04-23 DIAGNOSIS — I129 Hypertensive chronic kidney disease with stage 1 through stage 4 chronic kidney disease, or unspecified chronic kidney disease: Secondary | ICD-10-CM | POA: Diagnosis present

## 2015-04-23 LAB — FOLATE: Folate: 16.6 ng/mL (ref >5.9)

## 2015-04-23 LAB — URINALYSIS, ROUTINE W REFLEX MICROSCOPIC
Bilirubin Urine: NEGATIVE
Bilirubin Urine: NEGATIVE
Glucose, UA: NEGATIVE mg/dL
Glucose, UA: NEGATIVE mg/dL
Hgb urine dipstick: NEGATIVE
Hgb urine dipstick: NEGATIVE
Ketones, ur: 15 mg/dL — AB
Ketones, ur: 15 mg/dL — AB
Nitrite: POSITIVE — AB
Nitrite: POSITIVE — AB
Protein, ur: NEGATIVE mg/dL
Protein, ur: NEGATIVE mg/dL
Specific Gravity, Urine: 1.014 (ref 1.005–1.030)
Specific Gravity, Urine: 1.014 (ref 1.005–1.030)
Urobilinogen, UA: 1 mg/dL (ref 0.0–1.0)
Urobilinogen, UA: 1 mg/dL (ref 0.0–1.0)
pH: 8 (ref 5.0–8.0)
pH: 8 (ref 5.0–8.0)

## 2015-04-23 LAB — TROPONIN I: Troponin I: 0.03 ng/mL (ref <0.031)

## 2015-04-23 LAB — COMPREHENSIVE METABOLIC PANEL
ALT: 12 U/L — ABNORMAL LOW (ref 14–54)
AST: 25 U/L (ref 15–41)
Albumin: 3.9 g/dL (ref 3.5–5.0)
Alkaline Phosphatase: 125 U/L (ref 38–126)
Anion gap: 14 (ref 5–15)
BUN: 8 mg/dL (ref 6–20)
CO2: 22 mmol/L (ref 22–32)
Calcium: 9.4 mg/dL (ref 8.9–10.3)
Chloride: 99 mmol/L — ABNORMAL LOW (ref 101–111)
Creatinine, Ser: 0.87 mg/dL (ref 0.44–1.00)
GFR calc Af Amer: >60 mL/min (ref >60)
GFR calc non Af Amer: >60 mL/min (ref >60)
Glucose, Bld: 143 mg/dL — ABNORMAL HIGH (ref 65–99)
Potassium: 3.8 mmol/L (ref 3.5–5.1)
Sodium: 135 mmol/L (ref 135–145)
Total Bilirubin: 1.6 mg/dL — ABNORMAL HIGH (ref 0.3–1.2)
Total Protein: 7.8 g/dL (ref 6.5–8.1)

## 2015-04-23 LAB — PROTIME-INR
INR: 1.04 (ref 0.00–1.49)
Prothrombin Time: 13.8 seconds (ref 11.6–15.2)

## 2015-04-23 LAB — LACTATE DEHYDROGENASE: LDH: 292 U/L — ABNORMAL HIGH (ref 98–192)

## 2015-04-23 LAB — CBC WITH DIFFERENTIAL/PLATELET
Basophils Absolute: 0 10*3/uL (ref 0.0–0.1)
Basophils Relative: 0 % (ref 0–1)
Eosinophils Absolute: 0 10*3/uL (ref 0.0–0.7)
Eosinophils Relative: 0 % (ref 0–5)
HCT: 36.2 % (ref 36.0–46.0)
Hemoglobin: 11.5 g/dL — ABNORMAL LOW (ref 12.0–15.0)
Lymphocytes Relative: 17 % (ref 12–46)
Lymphs Abs: 1.5 10*3/uL (ref 0.7–4.0)
MCH: 25.3 pg — ABNORMAL LOW (ref 26.0–34.0)
MCHC: 31.8 g/dL (ref 30.0–36.0)
MCV: 79.6 fL (ref 78.0–100.0)
Monocytes Absolute: 0.8 10*3/uL (ref 0.1–1.0)
Monocytes Relative: 9 % (ref 3–12)
Neutro Abs: 6.6 10*3/uL (ref 1.7–7.7)
Neutrophils Relative %: 73 % (ref 43–77)
Platelets: 325 10*3/uL (ref 150–400)
RBC: 4.55 MIL/uL (ref 3.87–5.11)
RDW: 14.9 % (ref 11.5–15.5)
WBC: 8.9 10*3/uL (ref 4.0–10.5)

## 2015-04-23 LAB — GRAM STAIN

## 2015-04-23 LAB — RAPID URINE DRUG SCREEN, HOSP PERFORMED
Amphetamines: NOT DETECTED
Barbiturates: NOT DETECTED
Benzodiazepines: NOT DETECTED
Cocaine: NOT DETECTED
Opiates: NOT DETECTED
Tetrahydrocannabinol: NOT DETECTED

## 2015-04-23 LAB — MRSA PCR SCREENING: MRSA by PCR: NEGATIVE

## 2015-04-23 LAB — VITAMIN B12: Vitamin B-12: 193 pg/mL (ref 180–914)

## 2015-04-23 LAB — ETHANOL: Alcohol, Ethyl (B): <5 mg/dL (ref <5)

## 2015-04-23 LAB — URINE MICROSCOPIC-ADD ON

## 2015-04-23 LAB — LACTIC ACID, PLASMA
Lactic Acid, Venous: 2.8 mmol/L (ref 0.5–2.0)
Lactic Acid, Venous: 1.9 mmol/L (ref 0.5–2.0)

## 2015-04-23 LAB — AMMONIA: Ammonia: 45 umol/L — ABNORMAL HIGH (ref 9–35)

## 2015-04-23 MED ORDER — SODIUM CHLORIDE 0.9 % IJ SOLN
3.0000 mL | Freq: Two times a day (BID) | INTRAMUSCULAR | Status: DC
  Administered 2015-04-23 – 2015-04-29 (×10): 3 mL via INTRAVENOUS

## 2015-04-23 MED ORDER — CYANOCOBALAMIN 1000 MCG/ML IJ SOLN
1000.0000 ug | Freq: Once | INTRAMUSCULAR | Status: AC
  Administered 2015-04-23: 1000 ug via SUBCUTANEOUS
  Filled 2015-04-23: qty 1

## 2015-04-23 MED ORDER — PROPRANOLOL HCL 1 MG/ML IV SOLN
0.5000 mg | Freq: Once | INTRAVENOUS | Status: AC
  Administered 2015-04-23: 0.5 mg via INTRAVENOUS
  Filled 2015-04-23: qty 0.5

## 2015-04-23 MED ORDER — HEPARIN SODIUM (PORCINE) 5000 UNIT/ML IJ SOLN
5000.0000 [IU] | Freq: Three times a day (TID) | INTRAMUSCULAR | Status: DC
  Administered 2015-04-23 – 2015-04-29 (×18): 5000 [IU] via SUBCUTANEOUS
  Filled 2015-04-23 (×23): qty 1

## 2015-04-23 MED ORDER — ACETAMINOPHEN 650 MG RE SUPP: 650.0000 mg | Freq: Four times a day (QID) | RECTAL | Status: DC | PRN

## 2015-04-23 MED ORDER — THIAMINE HCL 100 MG/ML IJ SOLN
100.0000 mg | Freq: Every day | INTRAMUSCULAR | Status: DC
  Administered 2015-04-23 – 2015-04-29 (×7): 100 mg via INTRAVENOUS
  Filled 2015-04-23 (×7): qty 1

## 2015-04-23 MED ORDER — LABETALOL HCL 5 MG/ML IV SOLN
10.0000 mg | INTRAVENOUS | Status: DC | PRN
  Administered 2015-04-23 (×4): 10 mg via INTRAVENOUS
  Filled 2015-04-23 (×3): qty 4

## 2015-04-23 MED ORDER — PROPRANOLOL HCL 1 MG/ML IV SOLN
1.0000 mg | INTRAVENOUS | Status: DC
  Administered 2015-04-23 – 2015-04-24 (×8): 1 mg via INTRAVENOUS
  Filled 2015-04-23 (×16): qty 1

## 2015-04-23 MED ORDER — ACETAMINOPHEN 325 MG PO TABS
650.0000 mg | ORAL_TABLET | Freq: Four times a day (QID) | ORAL | Status: DC | PRN
  Administered 2015-04-25: 650 mg via ORAL
  Filled 2015-04-23: qty 2

## 2015-04-23 MED ORDER — DEXTROSE 5 % IV SOLN
1.0000 g | Freq: Every day | INTRAVENOUS | Status: DC
  Administered 2015-04-23 – 2015-04-26 (×4): 1 g via INTRAVENOUS
  Filled 2015-04-23 (×6): qty 10

## 2015-04-23 MED ORDER — HYDRALAZINE HCL 20 MG/ML IJ SOLN
5.0000 mg | Freq: Once | INTRAMUSCULAR | Status: AC
  Administered 2015-04-23: 5 mg via INTRAVENOUS
  Filled 2015-04-23: qty 1

## 2015-04-23 MED ORDER — DEXTROSE 5 % IV SOLN
1.0000 g | Freq: Once | INTRAVENOUS | Status: AC
  Administered 2015-04-23: 1 g via INTRAVENOUS
  Filled 2015-04-23: qty 10

## 2015-04-23 MED ORDER — ONDANSETRON HCL 4 MG/2ML IJ SOLN: 4.0000 mg | Freq: Four times a day (QID) | INTRAMUSCULAR | Status: DC | PRN

## 2015-04-23 MED ORDER — LORAZEPAM 2 MG/ML IJ SOLN
2.0000 mg | INTRAMUSCULAR | Status: DC | PRN
  Administered 2015-04-23 – 2015-04-26 (×9): 2 mg via INTRAVENOUS
  Filled 2015-04-23 (×11): qty 1

## 2015-04-23 MED ORDER — CLONIDINE HCL 0.1 MG/24HR TD PTWK
0.1000 mg | MEDICATED_PATCH | TRANSDERMAL | Status: DC
  Administered 2015-04-23 – 2015-04-29 (×2): 0.1 mg via TRANSDERMAL
  Filled 2015-04-23 (×2): qty 1

## 2015-04-23 MED ORDER — ONDANSETRON HCL 4 MG PO TABS: 4.0000 mg | ORAL_TABLET | Freq: Four times a day (QID) | ORAL | Status: DC | PRN

## 2015-04-23 MED ORDER — SODIUM CHLORIDE 0.9 % IV SOLN
INTRAVENOUS | Status: DC
  Administered 2015-04-23 – 2015-04-26 (×4): via INTRAVENOUS

## 2015-04-23 MED ORDER — THIAMINE HCL 100 MG/ML IJ SOLN
Freq: Once | INTRAVENOUS | Status: AC
  Administered 2015-04-23: 03:00:00 via INTRAVENOUS
  Filled 2015-04-23: qty 1000

## 2015-04-23 NOTE — Progress Notes (Signed)
[facility] PROGRESS NOTE   [name] HEN:[id number] DOB: [birth year] DOA: 04/22/2015 PCP: Redge Gainer, MD  HPI/Subjective: Seen with ICU RN at bedside, patient is lethargic, she got Ativan about 10 minutes before I seen her.  Assessment/Plan: Principal Problem:   Acute encephalopathy Active Problems:   Hypothyroidism   Thyrotoxic crisis   Hypertensive emergency   Alcohol withdrawal delirium   This is a no charge note, patient seen earlier today by my colleague Dr. Posey Pronto. Patient admitted for acute encephalopathy, confusion and probable alcohol withdrawal. Patient has hypertensive emergency, obtain MRI of the brain to rule out PRES. She has a UTI and started on Rocephin.  Code Status: Full Code Family Communication: Plan discussed with the patient. Disposition Plan: Remains inpatient Diet: Diet NPO time specified Except for: Sips with Meds  Consultants:  None  Procedures:  None  Antibiotics:  None   Objective: Filed Vitals:   04/23/15 1200  BP: 150/130  Pulse: 102  Temp:   Resp: 20    Intake/Output Summary (Last 24 hours) at 04/23/15 1340 Last data filed at 04/23/15 1243  Gross per 24 hour  Intake    975 ml  Output      0 ml  Net    975 ml   Filed Weights   04/22/15 2058  Weight: 76.204 kg (168 lb)    Exam: General: Alert and awake, oriented x3, not in any acute distress. HEENT: anicteric sclera, pupils reactive to light and accommodation, EOMI CVS: S1-S2 clear, no murmur rubs or gallops Chest: clear to auscultation bilaterally, no wheezing, rales or rhonchi Abdomen: soft nontender, nondistended, normal bowel sounds, no organomegaly Extremities: no cyanosis, clubbing or edema noted bilaterally Neuro: Cranial nerves II-XII intact, no focal neurological deficits  Data Reviewed: Basic Metabolic Panel:  Recent Labs Lab 04/22/15 2048 04/23/15 0529  NA 134* 135  K 3.3* 3.8  CL 98* 99*  CO2 22 22  GLUCOSE 128* 143*  BUN 11 8    CREATININE 0.96 0.87  CALCIUM 9.5 9.4   Liver Function Tests:  Recent Labs Lab 04/22/15 2048 04/23/15 0529  AST 15 25  ALT 11* 12*  ALKPHOS 123 125  BILITOT 1.1 1.6*  PROT 7.2 7.8  ALBUMIN 4.0 3.9   No results for input(s): LIPASE, AMYLASE in the last 168 hours.  Recent Labs Lab 04/23/15 0529  AMMONIA 45*   CBC:  Recent Labs Lab 04/22/15 2048 04/23/15 0529  WBC 8.5 8.9  NEUTROABS  --  6.6  HGB 11.5* 11.5*  HCT 36.6 36.2  MCV 79.9 79.6  PLT 318 325   Cardiac Enzymes:  Recent Labs Lab 04/23/15 0529  TROPONINI 0.03   BNP (last 3 results) No results for input(s): BNP in the last 8760 hours.  ProBNP (last 3 results) No results for input(s): PROBNP in the last 8760 hours.  CBG:  Recent Labs Lab 04/22/15 2102  GLUCAP 119*    Micro Recent Results (from the past 240 hour(s))  Gram stain     Status: None   Collection Time: 04/22/15 11:39 PM  Result Value Ref Range Status   Specimen Description URINE, CATHETERIZED  Final   Special Requests NONE  Final   Gram Stain   Final    CYTOSPIN SLIDE WBC PRESENT, PREDOMINANTLY PMN Multiple bacterial morphotypes present, none predominant. Suggest appropriate recollection if clinically indicated. RESULT CALLED TO, READ BACK BY AND VERIFIED WITH: NOTIFIED J WATTS,RN 04/23/15 0030 BY RHOLMES    Report Status 04/23/2015 FINAL  Final  MRSA PCR Screening     Status: None   Collection Time: 04/23/15  6:47 AM  Result Value Ref Range Status   MRSA by PCR NEGATIVE NEGATIVE Final    Comment:        The GeneXpert MRSA Assay (FDA approved for NASAL specimens only), is one component of a comprehensive MRSA colonization surveillance program. It is not intended to diagnose MRSA infection nor to guide or monitor treatment for MRSA infections.      Studies: Ct Head Wo Contrast  04/22/2015   CLINICAL DATA:  Altered mental status.  Slurred speech, resolved.  EXAM: CT HEAD WITHOUT CONTRAST  TECHNIQUE: Contiguous axial  images were obtained from the base of the skull through the vertex without intravenous contrast.  COMPARISON:  10/21/2013, 10/02/2012  FINDINGS: There is no intracranial hemorrhage, mass or evidence of acute infarction. There is severe generalized atrophy. There is severe chronic microvascular ischemic change. There is no significant extra-axial fluid collection. Brainstem and posterior fossa appear unremarkable.  No acute intracranial findings are evident.  IMPRESSION: Severe atrophy and chronic microvascular changes. No acute intracranial findings.   Electronically Signed   By: Andreas Newport M.D.   On: 04/22/2015 22:20    Scheduled Meds: . cefTRIAXone (ROCEPHIN)  IV  1 g Intravenous QHS  . cloNIDine  0.1 mg Transdermal Q Thu  . heparin  5,000 Units Subcutaneous 3 times per day  . propranolol  1 mg Intravenous 6 times per day  . sodium chloride  3 mL Intravenous Q12H  . thiamine  100 mg Intravenous Daily   Continuous Infusions: . sodium chloride 100 mL/hr at 04/23/15 1243       Time spent: 35 minutes    [facility] A  [facility] Pager [phone] If 7PM-7AM, please contact night-coverage at [email or web address], password [email or web address] 04/23/2015, 1:40 PM  LOS: 0 days

## 2015-04-23 NOTE — H&P (Signed)
[facility] History and Physical  Patient: [name]  MRN: [id number]  DOB: 1946-09-25  DOS: the patient was seen and examined on 04/23/2015 PCP: Redge Gainer, MD  Referring physician: Dr. Roxanne Mins Chief Complaint: Confusion  HPI: [name] is a 69 y.o. female with Past medical history of hypertension, alcohol abuse, B-12 deficiency, hypothyroidism, COPD. The patient is presenting with complaints of confusion. History is obtained from ED documentation as the patient is unable to provide any history due to confusion. Reportedly the patient's son who was at bedside initially mentions that the patient today has been having on and off confusion which is progressively worsening. There is also history of on and off worsening of confusion since last several days. Patient drinks 7-9 shots of vodka on a daily basis with last drink being on 04/21/2015. There was no fall no trauma no injury reported. There was no vomiting or diarrhea reported. The patient started having some slurred speech today and therefore she was brought here. Reportedly the slurred speech disorder of the time of her arrival to the ER. There is no history of suicidal ideation. There is no history of increased ingestion of medications.  The patient is coming from home. And at her baseline independent for most of her ADL.  Review of Systems: as mentioned in the history of present illness.  A comprehensive review of the other systems is negative.  Past Medical History  Diagnosis Date  . Hypertension   . Menopause   . Hypothyroid   . Mini stroke   . Hyperlipidemia   . B12 deficiency   . Shortness of breath   . Headache(784.0)   . Anxiety   . Pneumonia   . Anemia   . Depression   . Stroke 2010    no deficits  . Alcoholism 01/09/2012  . Hyponatremia 01/09/2012  . Chronic kidney disease (CKD), stage II (mild)   . COPD (chronic obstructive pulmonary disease)   . Orthostatic syncope 10/02/2012  . Carotid  artery narrowing 10/03/2012    On the right.   Past Surgical History  Procedure Laterality Date  . Appendectomy    . Tonsillectomy    . Cataract extraction w/phaco  06/20/2012    Procedure: CATARACT EXTRACTION PHACO AND INTRAOCULAR LENS PLACEMENT (IOC);  Surgeon: Tonny Branch, MD;  Location: [facility];  Service: Ophthalmology;  Laterality: Right;  CDE:10.66  . Cataract extraction w/phaco  07/11/2012    Procedure: CATARACT EXTRACTION PHACO AND INTRAOCULAR LENS PLACEMENT (IOC);  Surgeon: Tonny Branch, MD;  Location: [facility];  Service: Ophthalmology;  Laterality: Left;  CDE: 12.69   Social History:  reports that she quit smoking about 22 years ago. Her smoking use included Cigarettes. She has a 60 pack-year smoking history. She does not have any smokeless tobacco history on file. She reports that she drinks about 7.0 oz of alcohol per week. She reports that she does not use illicit drugs.  Allergies  Allergen Reactions  . Norvasc [Amlodipine Besylate] Hives  . Procardia [Nifedipine] Swelling    Edema      Family History  Problem Relation Age of Onset  . GER disease Mother   . Hypertension Maternal Grandmother     Prior to Admission medications   Medication Sig Start Date End Date Taking? Authorizing Provider  acetaminophen (TYLENOL) 500 MG tablet Take 500 mg by mouth every 6 (six) hours as needed for moderate pain. Pain   Yes Historical Provider, MD  allopurinol (ZYLOPRIM) 300 MG tablet Take 1  tablet (300 mg total) by mouth daily. 09/11/14  Yes Lysbeth Penner, FNP  aspirin 325 MG tablet Take 1 tablet (325 mg total) by mouth daily. 10/22/13  Yes Velvet Bathe, MD  DULoxetine (CYMBALTA) 60 MG capsule Take 1 capsule (60 mg total) by mouth daily. 11/12/14  Yes Lysbeth Penner, FNP  folic acid (FOLVITE) 1 MG tablet Take 1 tablet (1 mg total) by mouth daily. 10/28/13  Yes Lysbeth Penner, FNP  furosemide (LASIX) 40 MG tablet Take 1 tablet (40 mg total) by mouth daily. 07/29/13  Yes Chipper Herb, MD    indomethacin (INDOCIN SR) 75 MG CR capsule Take 1 capsule (75 mg total) by mouth 2 (two) times daily with a meal. Patient taking differently: Take 75 mg by mouth as needed (for gout).  09/11/14  Yes Lysbeth Penner, FNP  levothyroxine (SYNTHROID, LEVOTHROID) 125 MCG tablet Take 125 mcg by mouth daily before breakfast.   Yes Historical Provider, MD  medroxyPROGESTERone (PROVERA) 2.5 MG tablet Take 2.5 mg by mouth daily.  06/11/13  Yes Historical Provider, MD  metoprolol (LOPRESSOR) 50 MG tablet Take 1 tablet (50 mg total) by mouth 2 (two) times daily. 01/09/14  Yes Lysbeth Penner, FNP  PREMARIN 1.25 MG tablet Take 1.25 mg by mouth daily.  06/11/13  Yes Historical Provider, MD  valsartan (DIOVAN) 320 MG tablet Take 1 tablet (320 mg total) by mouth daily. 09/11/14  Yes Lysbeth Penner, FNP  azithromycin (ZITHROMAX) 250 MG tablet Take 2 po first day and then one po qd x 4 days 12/16/14   Lysbeth Penner, FNP  budesonide-formoterol [facility]) 160-4.5 MCG/ACT inhaler Inhale 2 puffs into the lungs 2 (two) times daily. 09/11/14   Lysbeth Penner, FNP  DULoxetine (CYMBALTA) 30 MG capsule Take 1 capsule (30 mg total) by mouth daily. 11/12/14   Lysbeth Penner, FNP  thiamine (VITAMIN B-1) 100 MG tablet Take 1 tablet (100 mg total) by mouth daily. 10/28/13   Lysbeth Penner, FNP    Physical Exam: Filed Vitals:   04/22/15 2130 04/22/15 2316 04/22/15 2330 04/23/15 0050  BP: 221/105 217/103 204/91 204/119  Pulse: 97 101 98 98  Temp:      TempSrc:      Resp: 14  18   Height:      Weight:      SpO2: 100%  100%     General: Delirious and and disoriented Appear in mild distress Eyes: PERRL ENT: Oral Mucosa clear dry. Neck: no JVD Cardiovascular: S1 and S2 Present, no Murmur, Peripheral Pulses Present Respiratory: Bilateral Air entry equal and Decreased,  Clear to Auscultation, no Crackles, no wheezes Abdomen: Bowel Sound present, Soft and non tender Skin: no Rash Extremities: no Pedal edema, no calf  tenderness Neurologic: Mental status delirious and disoriented unable to follow, arms. Cranial Nerves PERRL, EOM normal and present, Motor strength bilateral spontaneous movement of all extremities Sensation withdraws to painful stimuli bilaterally reflexes difficult to elicit knee and biceps, babinski equivocal  Labs on Admission:  CBC:  Recent Labs Lab 04/22/15 2048  WBC 8.5  HGB 11.5*  HCT 36.6  MCV 79.9  PLT 318    CMP     Component Value Date/Time   NA 134* 04/22/2015 2048   NA 135 11/12/2014 1239   K 3.3* 04/22/2015 2048   CL 98* 04/22/2015 2048   CO2 22 04/22/2015 2048   GLUCOSE 128* 04/22/2015 2048   GLUCOSE 87 11/12/2014 1239   BUN 11  04/22/2015 2048   BUN 19 11/12/2014 1239   CREATININE 0.96 04/22/2015 2048   CALCIUM 9.5 04/22/2015 2048   PROT 7.2 04/22/2015 2048   PROT 7.3 11/12/2014 1239   ALBUMIN 4.0 04/22/2015 2048   AST 15 04/22/2015 2048   ALT 11* 04/22/2015 2048   ALKPHOS 123 04/22/2015 2048   BILITOT 1.1 04/22/2015 2048   GFRNONAA 60* 04/22/2015 2048   GFRAA >60 04/22/2015 2048    No results for input(s): LIPASE, AMYLASE in the last 168 hours.  No results for input(s): CKTOTAL, CKMB, CKMBINDEX, TROPONINI in the last 168 hours. BNP (last 3 results) No results for input(s): BNP in the last 8760 hours.  ProBNP (last 3 results) No results for input(s): PROBNP in the last 8760 hours.   Radiological Exams on Admission: Ct Head Wo Contrast  04/22/2015   CLINICAL DATA:  Altered mental status.  Slurred speech, resolved.  EXAM: CT HEAD WITHOUT CONTRAST  TECHNIQUE: Contiguous axial images were obtained from the base of the skull through the vertex without intravenous contrast.  COMPARISON:  10/21/2013, 10/02/2012  FINDINGS: There is no intracranial hemorrhage, mass or evidence of acute infarction. There is severe generalized atrophy. There is severe chronic microvascular ischemic change. There is no significant extra-axial fluid collection. Brainstem  and posterior fossa appear unremarkable.  No acute intracranial findings are evident.  IMPRESSION: Severe atrophy and chronic microvascular changes. No acute intracranial findings.   Electronically Signed   By: Andreas Newport M.D.   On: 04/22/2015 22:20   EKG: Independently reviewed. sinus tachycardia.  Assessment/Plan Principal Problem:   Acute encephalopathy Active Problems:   Hypothyroidism   Thyrotoxic crisis   Hypertensive emergency   Alcohol withdrawal delirium   1. Acute encephalopathy The patient is presenting with waxing and waning mental status for last several days with sudden worsening today. It is unclear whether she has been compliant with her medications. She is found to be having significantly elevated blood pressure as well as free T4. CT of the head is unremarkable. She also has significant history of alcohol abuse and her alcohol level is negative. With this her current presentation could be multifactorial with combination of hypertensive encephalopathy, thyrotoxic crisis, as well as alcohol withdrawal delirium. She will be admitted in the step down unit. I would aggressively control her blood pressure with labetalol. She will be given IV fluids. Scheduled propranolol. IV thiamine, subcutaneous B-12 replacement. IV banana bag. CIWA protocol.  2. Slurred speech. If the patient's condition does not improve we'll consult neurology for further workup.  3. Possible UTI. Continue ceftriaxone.  4. History of diastolic dysfunction. The patient is on Lasix. At present holding. Dizziness and when needed  Advance goals of care discussion: Full code presumed   DVT Prophylaxis: subcutaneous Heparin Nutrition: Nothing by mouth  Disposition: Admitted as inpatient, step-down unit.  Author: Berle Mull, MD [facility] Pager: [phone] 04/23/2015  If 7PM-7AM, please contact night-coverage [email or web address] Password [email or web address]

## 2015-04-23 NOTE — Progress Notes (Signed)
CRITICAL VALUE ALERT  Critical value received:  Lactic Acid: 2.8  Date of notification:  04/23/2015  Time of notification:  0705  Critical value read back:Yes.    Nurse who received alert:  Eliane Decree, RN   MD notified (1st page):  Dr. Coralyn Pear  Time of first page:  0715  MD notified (2nd page):  Time of second page:  Responding MD:    Time MD responded:   Notified via text page. Morning nurse made aware.

## 2015-04-23 NOTE — ED Notes (Signed)
Pt placed in blue paper scrubs; pt would not keep gown on. Seizure pads applied to rails due to patient placing legs through railing. Safety sitter at bedside.

## 2015-04-23 NOTE — Progress Notes (Signed)
UR completed.  Await progression of medical treatment and determination of deficits to make d/c plans.    Sandi Mariscal, RN BSN [place] CCM Trauma/Neuro ICU Case Manager [phone]

## 2015-04-24 ENCOUNTER — Inpatient Hospital Stay (HOSPITAL_COMMUNITY): Payer: Medicare Other

## 2015-04-24 LAB — BASIC METABOLIC PANEL
Anion gap: 11 (ref 5–15)
BUN: 7 mg/dL (ref 6–20)
CO2: 23 mmol/L (ref 22–32)
Calcium: 8.9 mg/dL (ref 8.9–10.3)
Chloride: 105 mmol/L (ref 101–111)
Creatinine, Ser: 0.81 mg/dL (ref 0.44–1.00)
GFR calc Af Amer: >60 mL/min (ref >60)
GFR calc non Af Amer: >60 mL/min (ref >60)
Glucose, Bld: 103 mg/dL — ABNORMAL HIGH (ref 65–99)
Potassium: 3.4 mmol/L — ABNORMAL LOW (ref 3.5–5.1)
Sodium: 139 mmol/L (ref 135–145)

## 2015-04-24 LAB — CBC
HCT: 34.9 % — ABNORMAL LOW (ref 36.0–46.0)
Hemoglobin: 10.8 g/dL — ABNORMAL LOW (ref 12.0–15.0)
MCH: 25.2 pg — ABNORMAL LOW (ref 26.0–34.0)
MCHC: 30.9 g/dL (ref 30.0–36.0)
MCV: 81.5 fL (ref 78.0–100.0)
Platelets: 268 10*3/uL (ref 150–400)
RBC: 4.28 MIL/uL (ref 3.87–5.11)
RDW: 15.2 % (ref 11.5–15.5)
WBC: 5.6 10*3/uL (ref 4.0–10.5)

## 2015-04-24 MED ORDER — LABETALOL HCL 5 MG/ML IV SOLN
10.0000 mg | INTRAVENOUS | Status: DC | PRN
  Administered 2015-04-25 – 2015-04-28 (×5): 10 mg via INTRAVENOUS
  Filled 2015-04-24 (×8): qty 4

## 2015-04-24 MED ORDER — NITROGLYCERIN 2 % TD OINT
1.0000 [in_us] | TOPICAL_OINTMENT | Freq: Once | TRANSDERMAL | Status: AC
  Administered 2015-04-24: 1 [in_us] via TOPICAL
  Filled 2015-04-24: qty 30

## 2015-04-24 MED ORDER — METOPROLOL TARTRATE 50 MG PO TABS
50.0000 mg | ORAL_TABLET | Freq: Two times a day (BID) | ORAL | Status: DC
  Administered 2015-04-24 – 2015-04-26 (×5): 50 mg via ORAL
  Filled 2015-04-24 (×8): qty 1

## 2015-04-24 MED ORDER — IRBESARTAN 150 MG PO TABS
150.0000 mg | ORAL_TABLET | Freq: Every day | ORAL | Status: DC
  Administered 2015-04-25 – 2015-04-29 (×5): 150 mg via ORAL
  Filled 2015-04-24 (×6): qty 1

## 2015-04-24 MED ORDER — HYDRALAZINE HCL 20 MG/ML IJ SOLN
20.0000 mg | INTRAMUSCULAR | Status: DC | PRN
  Administered 2015-04-24 – 2015-04-29 (×5): 20 mg via INTRAVENOUS
  Filled 2015-04-24 (×5): qty 1

## 2015-04-24 MED ORDER — PROPRANOLOL HCL 1 MG/ML IV SOLN
1.0000 mg | INTRAVENOUS | Status: DC | PRN
  Administered 2015-04-24: 1 mg via INTRAVENOUS
  Filled 2015-04-24 (×2): qty 1

## 2015-04-24 MED ORDER — METOPROLOL TARTRATE 1 MG/ML IV SOLN
5.0000 mg | Freq: Once | INTRAVENOUS | Status: AC
  Administered 2015-04-24: 5 mg via INTRAVENOUS
  Filled 2015-04-24: qty 5

## 2015-04-24 NOTE — Progress Notes (Signed)
2C RN/Charge RN asked if we could keep the pt a little bit longer until the RN that would be taking the pt arrived.

## 2015-04-24 NOTE — Progress Notes (Signed)
Notified Md again about pt's HTN despite use of newly ordered PRN anti-hypertensives. New orders received. Will continue to monitor.

## 2015-04-24 NOTE — Progress Notes (Signed)
Notified Md about pt's increase in BP despite use of PRN anti-hypertensives. New orders received. Will continue to monitor.

## 2015-04-24 NOTE — Progress Notes (Signed)
[name] ION:[id number] DOB: 1946-09-27 DOA: 04/22/2015 PCP: Redge Gainer, MD  Brief narrative: 69 y/o ? TIA 10/2013, poorly controlled HTN, B12 deficiency, depression, hypothyroid, chronic kidney disease stage I to 2, significant ethanol abuse admitted with waxing and waning mentation/confusion/toxic metabolic encephalopathy 2/44/01-UUVOZ drinks 7-9 shots vodka She was seen by [place] neurologist Dr. Loretta Plume and was referred for neuropsychological testing to determine whether there was some psychogenic versus organic pathology An EEG was performed Admitted to step down unit for close monitoring because of waxing and waning mentation, hypertensive encephalopathy? Thyrotoxic crisis and potential EtOH withdrawal delirium-her CIWA on admission was around 11 and has ranged in the 6-7 range On admission she had some slurred speech and CT of the head showed severe microvascular changes but no acute issue   it was noted her urinalysis was positive and hence she was started on Rocephin. MRI of the brain pending   Past medical history-As per Problem list Chart reviewed as below- reviewed  Consultants:  None so far  Procedures:  CT head  Antibiotics:  Rocephin   Subjective   More alert and only minimally oriented-cannot tell us the date or year No spontaneous complaints but still sleepy Cannot tell me how she got her but does state that she's been having some dysuria Overall her review of systems is not clear given her sleepiness She has last received Ativan yesterday Nursing reports patient passed a relatively quiet night but was in restraints none none  None Objective    Interim History: none  Telemetry: none   Objective: Filed Vitals:   04/24/15 0500 04/24/15 0600 04/24/15 0700 04/24/15 0754  BP: 155/76 159/73 158/76   Pulse: 80 75 76   Temp:    97.6 F (36.4 C)  TempSrc:    Axillary  Resp: 16 17 12    Height:      Weight:      SpO2: 100% 100% 99%      Intake/Output Summary (Last 24 hours) at 04/24/15 0825 Last data filed at 04/24/15 0700  Gross per 24 hour  Intake 2803.33 ml  Output      0 ml  Net 2803.33 ml    Exam:  EOMI NCAT Chest clinically clear S1-S2 no murmur rub or gallop Purposeful movements noted however sleepy and lethargic and a little confused--cannot tell me or the nurse day date or year Power however seems 5/5 Smile is midline and symmetric bilaterally Tongue not examined External ocular movements patient cannot coordinate with as she is sleepy Past sensory deferred Brachial radialis on the right brisk as is knee jerk on right Rest of exam deferred as patient moving around in bed    Data Reviewed: Basic Metabolic Panel:  Recent Labs Lab 04/22/15 2048 04/23/15 0529 04/24/15 0235  NA 134* 135 139  K 3.3* 3.8 3.4*  CL 98* 99* 105  CO2 22 22 23   GLUCOSE 128* 143* 103*  BUN 11 8 7   CREATININE 0.96 0.87 0.81  CALCIUM 9.5 9.4 8.9   Liver Function Tests:  Recent Labs Lab 04/22/15 2048 04/23/15 0529  AST 15 25  ALT 11* 12*  ALKPHOS 123 125  BILITOT 1.1 1.6*  PROT 7.2 7.8  ALBUMIN 4.0 3.9   No results for input(s): LIPASE, AMYLASE in the last 168 hours.  Recent Labs Lab 04/23/15 0529  AMMONIA 45*   CBC:  Recent Labs Lab 04/22/15 2048 04/23/15 0529 04/24/15 0235  WBC 8.5 8.9 5.6  NEUTROABS  --  6.6  --  HGB 11.5* 11.5* 10.8*  HCT 36.6 36.2 34.9*  MCV 79.9 79.6 81.5  PLT 318 325 268   Cardiac Enzymes:  Recent Labs Lab 04/23/15 0529  TROPONINI 0.03   BNP: Invalid input(s): POCBNP CBG:  Recent Labs Lab 04/22/15 2102  GLUCAP 119*    Recent Results (from the past 240 hour(s))  Gram stain     Status: None   Collection Time: 04/22/15 11:39 PM  Result Value Ref Range Status   Specimen Description URINE, CATHETERIZED  Final   Special Requests NONE  Final   Gram Stain   Final    CYTOSPIN SLIDE WBC PRESENT, PREDOMINANTLY PMN Multiple bacterial morphotypes present,  none predominant. Suggest appropriate recollection if clinically indicated. RESULT CALLED TO, READ BACK BY AND VERIFIED WITH: NOTIFIED J WATTS,RN 04/23/15 0030 BY RHOLMES    Report Status 04/23/2015 FINAL  Final  MRSA PCR Screening     Status: None   Collection Time: 04/23/15  6:47 AM  Result Value Ref Range Status   MRSA by PCR NEGATIVE NEGATIVE Final    Comment:        The GeneXpert MRSA Assay (FDA approved for NASAL specimens only), is one component of a comprehensive MRSA colonization surveillance program. It is not intended to diagnose MRSA infection nor to guide or monitor treatment for MRSA infections.      Studies:              All Imaging reviewed and is as per above notation   Scheduled Meds: . cefTRIAXone (ROCEPHIN)  IV  1 g Intravenous QHS  . cloNIDine  0.1 mg Transdermal Q Thu  . heparin  5,000 Units Subcutaneous 3 times per day  . propranolol  1 mg Intravenous 6 times per day  . sodium chloride  3 mL Intravenous Q12H  . thiamine  100 mg Intravenous Daily   Continuous Infusions: . sodium chloride 100 mL/hr at 04/23/15 2324     Assessment/Plan: Toxic metabolic encephalopathy-most likely secondary to ethanol use. Patient home meds include Cymbalta 60--CT head noncontributory but microvascular insults suggestive of small TIAs MRI brain pending. Likely pyelonephritis-unfortunately urine culture was not performed-she had positive nitrites plus ketones and small leukocytes with many bacteria--we can treat with empiric Rocephin for the next 2-3 days. Hopefully mentation will clear note that she has had no fever and no white count Hypertensive emergency? Press syndrome-continue empiric propanolol 1 mg IV 6 times a day and clonidine transdermal 0.1 mg in addition we will start back her home dose of metoprolol 50 twice a day as well as ARB Avapro 150 daily if she is able to tolerate by mouth. I will discontinue her labetalol given it is short acting. ETOHism-I will need to  get some collateral from family regarding her actual ethanol use and what happened however she will need to be on the Cipro protocol. Her numbers are not terrible and I feel is by midday she continues to be stable she can be transferred out of step down. Reported B12 deficiency likely secondary to ethanolism-monitor as an outpatient Prior history chronic kidney disease-improved  Full full  Code Status: Full Family Communication: none bedside-will call within 24 hrs Disposition Plan: tx to tele later if not floridly disoritented    Verneita Griffes, MD  [facility] Pager [phone] 04/24/2015, 8:25 AM    LOS: 1 day

## 2015-04-24 NOTE — Progress Notes (Signed)
Pt's BP has returned to an acceptable level and report was attempted to be given. 2C stated they will call me back.

## 2015-04-24 NOTE — Progress Notes (Signed)
Initial Nutrition Assessment  DOCUMENTATION CODES:  Not applicable  INTERVENTION:   (Advance diet as medically appropriate, RD to add supplements accordingly)  NUTRITION DIAGNOSIS:  Inadequate oral intake related to inability to eat as evidenced by NPO status   GOAL:  Patient will meet greater than or equal to 90% of their needs  MONITOR:  Diet advancement, PO intake, Labs, Weight trends, I & O's  REASON FOR ASSESSMENT:  Malnutrition Screening Tool  ASSESSMENT: 69 y.o. Female with PMH of TIA, HTN, depression, CKD, ethanol abuse; admitted with waxing and waning mentation/confusion/toxic metabolic encephalopathy.  RD unable to obtain nutrition hx.  Pt encephalopathic; trying to get out of bed upon visit.  Per Malnutrition Screening Tool Report, pt has been eating poorly due to a poor appetite.  Currently NPO.  Per weight readings below pt has had severe weight loss of 11% since January 2016.  RD unable to complete Nutrition Focused Physical Exam at this time.  RD suspects malnutrition, however, unable to identify at this time.  Height:  Ht Readings from Last 1 Encounters:  04/22/15 5\' 10"  (1.778 m)    Weight:  Wt Readings from Last 1 Encounters:  04/24/15 146 lb 2.6 oz (66.3 kg)    Ideal Body Weight:  68 kg  Wt Readings from Last 10 Encounters:  04/24/15 146 lb 2.6 oz (66.3 kg)  12/16/14 165 lb (74.844 kg)  11/12/14 170 lb 6.4 oz (77.293 kg)  09/11/14 180 lb (81.647 kg)  02/19/14 190 lb 9.6 oz (86.456 kg)  01/09/14 185 lb (83.915 kg)  01/06/14 185 lb (83.915 kg)  12/18/13 187 lb (84.823 kg)  11/17/13 185 lb 9.6 oz (84.188 kg)  11/03/13 182 lb 6.4 oz (82.736 kg)    BMI:  Body mass index is 20.97 kg/(m^2).  Estimated Nutritional Needs:  Kcal:  1700-1900  Protein:  80-90 gm  Fluid:  1.7-1.9 L  Skin:  Reviewed, no issues  Diet Order:  Diet NPO time specified Except for: Sips with Meds  EDUCATION NEEDS:  No education needs identified at this  time   Intake/Output Summary (Last 24 hours) at 04/24/15 1213 Last data filed at 04/24/15 1040  Gross per 24 hour  Intake   2203 ml  Output      0 ml  Net   2203 ml    Last BM:  unknown   Arthur Holms, RD, LDN Pager #: [phone] After-Hours Pager #: [phone]

## 2015-04-24 NOTE — Progress Notes (Signed)
Pt received a bed on 2C. However at this time her BP is still elevated and am working with the Md to get it down to an acceptable level before we tx the pt. Md stated to hold the pt on 42M until BP was controlled. 2C RN was notified and appreciated our effort to control the pt's BP before tx.

## 2015-04-25 ENCOUNTER — Inpatient Hospital Stay (HOSPITAL_COMMUNITY): Payer: Medicare Other

## 2015-04-25 MED ORDER — ALLOPURINOL 300 MG PO TABS
300.0000 mg | ORAL_TABLET | Freq: Every day | ORAL | Status: DC
  Administered 2015-04-25 – 2015-04-29 (×5): 300 mg via ORAL
  Filled 2015-04-25 (×5): qty 1

## 2015-04-25 MED ORDER — LEVOTHYROXINE SODIUM 125 MCG PO TABS
125.0000 ug | ORAL_TABLET | Freq: Every day | ORAL | Status: DC
  Administered 2015-04-26 – 2015-04-27 (×2): 125 ug via ORAL
  Filled 2015-04-25 (×3): qty 1

## 2015-04-25 MED ORDER — ASPIRIN 325 MG PO TABS
325.0000 mg | ORAL_TABLET | Freq: Every day | ORAL | Status: DC
  Administered 2015-04-25 – 2015-04-29 (×5): 325 mg via ORAL
  Filled 2015-04-25 (×5): qty 1

## 2015-04-25 MED ORDER — PNEUMOCOCCAL VAC POLYVALENT 25 MCG/0.5ML IJ INJ
0.5000 mL | INJECTION | INTRAMUSCULAR | Status: AC
  Administered 2015-04-27: 0.5 mL via INTRAMUSCULAR
  Filled 2015-04-25: qty 0.5

## 2015-04-25 MED ORDER — LEVOTHYROXINE SODIUM 125 MCG PO TABS
125.0000 ug | ORAL_TABLET | Freq: Every day | ORAL | Status: DC
  Filled 2015-04-25: qty 1

## 2015-04-25 MED ORDER — BUDESONIDE-FORMOTEROL FUMARATE 160-4.5 MCG/ACT IN AERO
2.0000 | INHALATION_SPRAY | Freq: Two times a day (BID) | RESPIRATORY_TRACT | Status: DC
  Administered 2015-04-25 – 2015-04-29 (×7): 2 via RESPIRATORY_TRACT
  Filled 2015-04-25: qty 6

## 2015-04-25 MED ORDER — ISOSORBIDE MONONITRATE ER 30 MG PO TB24
30.0000 mg | ORAL_TABLET | Freq: Every day | ORAL | Status: DC
  Administered 2015-04-25 – 2015-04-28 (×4): 30 mg via ORAL
  Filled 2015-04-25 (×4): qty 1

## 2015-04-25 MED ORDER — DULOXETINE HCL 60 MG PO CPEP
60.0000 mg | ORAL_CAPSULE | Freq: Every day | ORAL | Status: DC
  Administered 2015-04-25 – 2015-04-29 (×5): 60 mg via ORAL
  Filled 2015-04-25 (×5): qty 1

## 2015-04-25 NOTE — Evaluation (Signed)
Physical Therapy Evaluation Patient Details Name: [name] MRN: [id number] DOB: 02-Feb-1946 Today's Date: 04/25/2015   History of Present Illness  HPI: [name] is a 69 y.o. female with Past medical history of hypertension, alcohol abuse, B-12 deficiency, hypothyroidism, COPD. Admitted with confusion  Clinical Impression   Pt admitted with above diagnosis. Pt currently with functional limitations due to the deficits listed below (see PT Problem List).  Pt will benefit from skilled PT to increase their independence and safety with mobility to allow discharge to the venue listed below.       Follow Up Recommendations Other (comment) (Post-acute rehab)    Equipment Recommendations  Rolling walker with 5" wheels;3in1 (PT)    Recommendations for Other Services OT consult     Precautions / Restrictions Precautions Precautions: Fall      Mobility  Bed Mobility Overal bed mobility: Needs Assistance Bed Mobility: Supine to Sit     Supine to sit: Min assist     General bed mobility comments: Min assist with verbal and tactile cueing for inititation and to complete the task  Transfers Overall transfer level: Needs assistance Equipment used: Rolling walker (2 wheeled) Transfers: Sit to/from Stand Sit to Stand: Mod assist         General transfer comment: light mod assist to power-up; Cues for hand placement and safety; noted slow to follow commands; multiple cues to reach back for armrests to sit  Ambulation/Gait Ambulation/Gait assistance: Min assist;+2 safety/equipment Ambulation Distance (Feet): 5 Feet Assistive device: Rolling walker (2 wheeled) Gait Pattern/deviations: Decreased step length - right;Decreased step length - left     General Gait Details: Cues to self-monitor for activity tolerance  Stairs            Wheelchair Mobility    Modified Rankin (Stroke Patients Only)       Balance Overall balance assessment: Needs  assistance Sitting-balance support: Single extremity supported Sitting balance-Leahy Scale: Fair     Standing balance support: Bilateral upper extremity supported Standing balance-Leahy Scale: Poor                               Pertinent Vitals/Pain Pain Assessment: No/denies pain    Home Living Family/patient expects to be discharged to:: Private residence Living Arrangements: Alone;Children Available Help at Discharge: Family;Available PRN/intermittently (son works) Type of Home: House Home Access: Stairs to enter Entrance Stairs-Rails: Psychiatric nurse of Steps: 3 Home Layout: One level Home Equipment: [place] - single point;Shower seat      Prior Function Level of Independence: Independent               Hand Dominance   Dominant Hand: Left    Extremity/Trunk Assessment   Upper Extremity Assessment: Overall WFL for tasks assessed           Lower Extremity Assessment: Generalized weakness         Communication   Communication: No difficulties  Cognition Arousal/Alertness: Awake/alert Behavior During Therapy: WFL for tasks assessed/performed Overall Cognitive Status: No family/caregiver present to determine baseline cognitive functioning                      General Comments      Exercises        Assessment/Plan    PT Assessment Patient needs continued PT services  PT Diagnosis Difficulty walking   PT Problem List Decreased strength;Decreased range of motion;Decreased activity tolerance;Decreased balance;Decreased  mobility;Decreased coordination;Decreased cognition;Decreased knowledge of use of DME;Decreased safety awareness  PT Treatment Interventions DME instruction;Gait training;Stair training;Functional mobility training;Therapeutic activities;Therapeutic exercise;Balance training;Cognitive remediation;Patient/family education   PT Goals (Current goals can be found in the Care Plan section) Acute Rehab PT  Goals Patient Stated Goal: did not stae goal, but agreeable to getting OOB PT Goal Formulation: Patient unable to participate in goal setting Time For Goal Achievement: 05/09/15 Potential to Achieve Goals: Good    Frequency Min 3X/week   Barriers to discharge Decreased caregiver support Must be indpendent to dc hoem    Co-evaluation               End of Session Equipment Utilized During Treatment: Gait belt Activity Tolerance: Patient tolerated treatment well Patient left: in chair;with call bell/phone within reach;with chair alarm set Nurse Communication: Mobility status         Time: 5701-7793 PT Time Calculation (min) (ACUTE ONLY): 17 min   Charges:   PT Evaluation $Initial PT Evaluation Tier I: 1 Procedure     PT G CodesQuin Hoop 04/25/2015, 4:58 PM  Roney Marion, [place] Pager [phone] Office [phone]

## 2015-04-25 NOTE — Progress Notes (Signed)
Pt given 20 mg of hydralazine for BP 207/102.  RN will continue to monitor.

## 2015-04-25 NOTE — Progress Notes (Signed)
Pt received labetalol 10 mg at 0525 for BP 181/85. RN will continue to monitor.

## 2015-04-25 NOTE — Progress Notes (Signed)
Pt has not had CIWA scales sufficient for ativan however pt needs to go down for MRI and yesterday was unsuccessful due to agitation and inability to lay still.  RN administered 2mg  of ativan prophylactically for MRI.

## 2015-04-25 NOTE — Progress Notes (Signed)
Pt's MRI attempted a second time with Ativan prior to attempt. Pt very groggy/sleepy got in scanner ran localizer pt started pushing her self out of scanner while exclaiming she couldn't tolerate exam she was claustrophobic. I asked pt what I could do to get  Her to comfortable to tolerate exam she didn't answer me at first. I asked if she has had MRI before and she said yes; I asked what got her through her exams at that time and she said drugs.... We offered to stay in room w pt also she didn't respond to that. Called spoke w RN prior to returning pt to her room. Pt is so groggy RN doesn't fell it is advisable to further sedate pt and I agree. Please let us know if pt will comply with exam or if we can be of further help to this pt. Ext [phone]

## 2015-04-25 NOTE — Progress Notes (Signed)
[name] DQQ:[id number] DOB: 1945/12/15 DOA: 04/22/2015 PCP: Redge Gainer, MD  Brief narrative: 69 y/o ? 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 Loretta Plume and was referred for neuropsychological testing to determine whether there was some psychogenic versus organic pathology An EEG was performed Admitted to step down unit for close monitoring because of waxing and waning mentation, hypertensive encephalopathy? Thyrotoxic crisis and potential EtOH withdrawal delirium-her CIWA on admission was around 11 and has ranged in the 6-7 range On admission she had some slurred speech and CT of the head showed severe microvascular changes but no acute issue   it was noted her urinalysis was positive and hence she was started on Rocephin. MRI of the brain could not be performed and as blood pressure trended down and patient was not confused this was canceled.   Past medical history-As per Problem list Chart reviewed as below- reviewed  Consultants:  None so far  Procedures:  CT head  Antibiotics:  Rocephin   Subjective   More alert oriented in no apparent distress Wants to eat No chest pain Can tell me the year can tell me the president Still slightly confused as to the place and the person however can tell me the name of her son No rash no fever no chills   None Objective    Interim History: none  Telemetry: none   Objective: Filed Vitals:   04/25/15 0430 04/25/15 0509 04/25/15 0555 04/25/15 0720  BP: 172/78 181/85 168/67 179/77  Pulse: 96 90 93 90  Temp:      TempSrc:      Resp: 21  16 17   Height:      Weight:      SpO2:    96%    Intake/Output Summary (Last 24 hours) at 04/25/15 0755 Last data filed at  04/25/15 0500  Gross per 24 hour  Intake   2203 ml  Output    900 ml  Net   1303 ml    Exam:  EOMI NCAT Chest clinically clear S1-S2 no murmur rub or gallop Alert x 4 Power however seems 5/5 Moving all 4 limbs equally, smile symmetrical, extraocular movements intact, reflexes 2/3 in lower extremities, plantar dorsiflexion is normal    Data Reviewed: Basic Metabolic Panel:  Recent Labs Lab 04/22/15 2048 04/23/15 0529 04/24/15 0235  NA 134* 135 139  K 3.3* 3.8 3.4*  CL 98* 99* 105  CO2 22 22 23   GLUCOSE 128* 143* 103*  BUN 11 8 7   CREATININE 0.96 0.87 0.81  CALCIUM 9.5 9.4 8.9   Liver Function Tests:  Recent Labs Lab 04/22/15 2048 04/23/15 0529  AST 15 25  ALT 11* 12*  ALKPHOS 123 125  BILITOT 1.1 1.6*  PROT 7.2 7.8  ALBUMIN 4.0 3.9   No results for input(s): LIPASE, AMYLASE in the last 168 hours.  Recent Labs Lab 04/23/15 0529  AMMONIA 45*   CBC:  Recent Labs Lab 04/22/15 2048 04/23/15 0529 04/24/15 0235  WBC 8.5 8.9 5.6  NEUTROABS  --  6.6  --   HGB 11.5* 11.5* 10.8*  HCT 36.6 36.2 34.9*  MCV 79.9 79.6 81.5  PLT 318 325 268   Cardiac Enzymes:  Recent Labs Lab 04/23/15 0529  TROPONINI 0.03   BNP: Invalid input(s): POCBNP CBG:  Recent  Labs Lab 04/22/15 2102  GLUCAP 119*    Recent Results (from the past 240 hour(s))  Gram stain     Status: None   Collection Time: 04/22/15 11:39 PM  Result Value Ref Range Status   Specimen Description URINE, CATHETERIZED  Final   Special Requests NONE  Final   Gram Stain   Final    CYTOSPIN SLIDE WBC PRESENT, PREDOMINANTLY PMN Multiple bacterial morphotypes present, none predominant. Suggest appropriate recollection if clinically indicated. RESULT CALLED TO, READ BACK BY AND VERIFIED WITH: NOTIFIED J WATTS,RN 04/23/15 0030 BY RHOLMES    Report Status 04/23/2015 FINAL  Final  MRSA PCR Screening     Status: None   Collection Time: 04/23/15  6:47 AM  Result Value Ref Range Status   MRSA by  PCR NEGATIVE NEGATIVE Final    Comment:        The GeneXpert MRSA Assay (FDA approved for NASAL specimens only), is one component of a comprehensive MRSA colonization surveillance program. It is not intended to diagnose MRSA infection nor to guide or monitor treatment for MRSA infections.      Studies:              All Imaging reviewed and is as per above notation   Scheduled Meds: . cefTRIAXone (ROCEPHIN)  IV  1 g Intravenous QHS  . cloNIDine  0.1 mg Transdermal Q Thu  . heparin  5,000 Units Subcutaneous 3 times per day  . irbesartan  150 mg Oral Daily  . metoprolol  50 mg Oral BID  . sodium chloride  3 mL Intravenous Q12H  . thiamine  100 mg Intravenous Daily   Continuous Infusions: . sodium chloride 100 mL/hr at 04/25/15 9518     Assessment/Plan:   Toxic metabolic encephalopathy 2/2 ethanol use VS pyelonephritis. Patient home meds include Cymbalta 60--CT head noncontributory but microvascular insults suggestive of small TIAs.   MRI brain could not be performed-clinically not indicated as no longer confused blood pressure better and no clinical signs and symptoms of stroke--because her ammonia was elevated we could consider starting lactulose however as she is mentally cleared I suspect this is more withdrawn than hepatic encephalopathy-observe for clinical improvement  Likely pyelonephritis-unfortunately urine culture was not performed-she had positive nitrites plus ketones and small leukocytes with many bacteria--we can treat with empiric Rocephin for the next 2-3 days.   Hypertensive emergency? Press as more awake continue home dose of metoprolol 50 twice a day as well as ARB Avapro 150 daily if she is able to tolerate an uptitrate as needed-she does have when necessary hydralazine 20 mg every 4 when necessary.  Added Imdur 30 l and see the response and discontinue Nitropaste. If she remains stable we may transfer to telemetry later today  ETosm-d/w son Saralyn Pilar on  telephone-was acting odd-hd urinated all over the floor, was trying to sit on trash can "like a toilet"-son couldn't get any communication-was just sayin "okay"-EMS was called.  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

## 2015-04-26 MED ORDER — ENSURE ENLIVE PO LIQD
237.0000 mL | Freq: Two times a day (BID) | ORAL | Status: DC
  Administered 2015-04-27 – 2015-04-29 (×5): 237 mL via ORAL

## 2015-04-26 MED ORDER — HALOPERIDOL LACTATE 5 MG/ML IJ SOLN
5.0000 mg | Freq: Once | INTRAMUSCULAR | Status: AC
  Administered 2015-04-26: 5 mg via INTRAMUSCULAR
  Filled 2015-04-26: qty 1

## 2015-04-26 MED ORDER — HALOPERIDOL LACTATE 5 MG/ML IJ SOLN
2.5000 mg | Freq: Once | INTRAMUSCULAR | Status: AC
  Administered 2015-04-26: 2.5 mg via INTRAMUSCULAR

## 2015-04-26 MED ORDER — HALOPERIDOL LACTATE 5 MG/ML IJ SOLN
INTRAMUSCULAR | Status: AC
  Filled 2015-04-26: qty 1

## 2015-04-26 NOTE — Progress Notes (Signed)
Patient still extremely irritable attempting to get out of bed and yelling at staff, Dr. Baltazar Najjar notified and aware. 2.5 mg IM haldol ordered and administered, Posey belt restraint order placed and initiated, patient also ordered for safety sitter, bed alarm on, frequent checks maintained, will report to oncoming shift and continue to monitor closely.

## 2015-04-26 NOTE — Progress Notes (Signed)
Patient becoming more restless during night attempting to get out of bed and very disoriented to person, time, and place, CIWA score rising and 2 mg ativan administered X 2 with little effect, patient irritability seemed to increase with ativan administration, BP 176/94 on left and 192/91 on right, IV pulled out by patient, Dr. Baltazar Najjar notified and aware, 5 mg IM haldol ordered and administered, awaiting new IV at this time for administration of IV labetalol for BP, will continue to monitor closely.

## 2015-04-26 NOTE — Progress Notes (Signed)
Physical Therapy Treatment Patient Details Name: [name] MRN: [id number] DOB: [birth year] Today's Date: 04/26/2015    History of Present Illness HPI: [name] is a 69 y.o. female with Past medical history of hypertension, alcohol abuse, B-12 deficiency, hypothyroidism, COPD. Admitted with confusion    PT Comments    Pt not progressing with mobility today but likely due to lethargy induced by meds for agitation. Pt transferred bed to chair with +2 max A. Will continue to progress mobility as pt tolerates.   Follow Up Recommendations  SNF     Equipment Recommendations  Rolling walker with 5" wheels;3in1 (PT)    Recommendations for Other Services OT consult     Precautions / Restrictions Precautions Precautions: Fall Precaution Comments: pt very fearful of falling Restrictions Weight Bearing Restrictions: No    Mobility  Bed Mobility Overal bed mobility: Needs Assistance Bed Mobility: Supine to Sit     Supine to sit: Max assist     General bed mobility comments: pt resisting rolling to left to sit up and then with strong posterior lean during transition.   Transfers Overall transfer level: Needs assistance Equipment used: 2 person hand held assist Transfers: Sit to/from [facility] Sit to Stand: Max assist Stand pivot transfers: Max assist;+2 physical assistance       General transfer comment: practiced sit to stand with therapist in front of pt, pt achieved partial stand and intiated standing well but then was very fearful and reaching hands back towards bed so max A to maintain standing. Required +2 max A for SPT to chair and pt fearful of falling. Able to take small pivot steps with feet during transfer  Ambulation/Gait             General Gait Details: unable today   Stairs            Wheelchair Mobility    Modified Rankin (Stroke Patients Only)       Balance Overall balance assessment: Needs  assistance Sitting-balance support: Bilateral upper extremity supported;Feet supported Sitting balance-Leahy Scale: Poor Sitting balance - Comments: required mod A for initial sitting due to posterior lean but once to EOB and adjusted, could sit with min-guard A Postural control: Posterior lean Standing balance support: Bilateral upper extremity supported;During functional activity Standing balance-Leahy Scale: Zero Standing balance comment: unable to maintain standing today without max A +2, likely due to lethargy                    Cognition Arousal/Alertness: Lethargic;Suspect due to medications Behavior During Therapy: Flat affect Overall Cognitive Status: No family/caregiver present to determine baseline cognitive functioning Area of Impairment: Orientation;Attention;Memory;Following commands;Safety/judgement Orientation Level: Situation;Time;Place Current Attention Level: Focused Memory: Decreased short-term memory;Decreased recall of precautions Following Commands: Follows one step commands inconsistently;Follows one step commands with increased time Safety/Judgement: Decreased awareness of deficits     General Comments: pt verbally agreeable to mobility but physically resistant due to fear    Exercises      General Comments        Pertinent Vitals/Pain Pain Assessment: No/denies pain    Home Living                      Prior Function            PT Goals (current goals can now be found in the care plan section) Acute Rehab PT Goals Patient Stated Goal: none stated PT Goal Formulation: Patient unable to  participate in goal setting Time For Goal Achievement: 05/09/15 Potential to Achieve Goals: Good Progress towards PT goals: Not progressing toward goals - comment (lethargic today, on Haldol)    Frequency  Min 3X/week    PT Plan Discharge plan needs to be updated    Co-evaluation             End of Session   Activity Tolerance:  Patient limited by lethargy Patient left: in chair;with call bell/phone within reach;with nursing/sitter in room     Time: 1150-1204 PT Time Calculation (min) (ACUTE ONLY): 14 min  Charges:  $Therapeutic Activity: 8-22 mins                    G Codes:     Leighton Roach, PT  Acute Rehab Services  [phone]  Leighton Roach 04/26/2015, 12:31 PM

## 2015-04-26 NOTE — Progress Notes (Signed)
[name] XBW:[id number] DOB: 1946/05/12 DOA: 04/22/2015 PCP: Redge Gainer, MD  Brief narrative: 69 y/o ? 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 Loretta Plume and was referred for neuropsychological testing to determine whether there was some psychogenic versus organic pathology An EEG was performed Admitted to step down unit for close monitoring because of waxing and waning mentation, hypertensive encephalopathy? Thyrotoxic crisis and potential EtOH withdrawal delirium-her CIWA on admission was around 11 and has ranged in the 6-7 range On admission she had some slurred speech and CT of the head showed severe microvascular changes but no acute issue   it was noted her urinalysis was positive and hence she was started on Rocephin. MRI of the brain could not be performed and as blood pressure trended down and patient was not confused this was canceled.   Past medical history-As per Problem list Chart reviewed as below- reviewed  Consultants:  None so far  Procedures:  CT head  Antibiotics:  Rocephin   Subjective   Sleepy this am On my re-visit this pm more awake but still confused Needs sitter events from last pm noted   None Objective    Interim History: none  Telemetry: none   Objective: Filed Vitals:   04/26/15 0600 04/26/15 1032 04/26/15 1330 04/26/15 1737  BP: 161/76 201/90 138/70 158/87  Pulse:  94 93   Temp:  97.8 F (36.6 C) 97.8 F (36.6 C)   TempSrc:  Axillary Oral   Resp:  16    Height:      Weight:      SpO2:  100% 97%     Intake/Output Summary (Last 24 hours) at 04/26/15 1834 Last data filed at 04/26/15 1541  Gross per 24 hour  Intake    500 ml  Output   1325 ml  Net   -825 ml     Exam:  EOMI NCAT-sleepy Chest clinically clear S1-S2 no murmur rub or gallop Alert x 4 Power however seems 5/5     Data Reviewed: Basic Metabolic Panel:  Recent Labs Lab 04/22/15 2048 04/23/15 0529 04/24/15 0235  NA 134* 135 139  K 3.3* 3.8 3.4*  CL 98* 99* 105  CO2 22 22 23   GLUCOSE 128* 143* 103*  BUN 11 8 7   CREATININE 0.96 0.87 0.81  CALCIUM 9.5 9.4 8.9   Liver Function Tests:  Recent Labs Lab 04/22/15 2048 04/23/15 0529  AST 15 25  ALT 11* 12*  ALKPHOS 123 125  BILITOT 1.1 1.6*  PROT 7.2 7.8  ALBUMIN 4.0 3.9   No results for input(s): LIPASE, AMYLASE in the last 168 hours.  Recent Labs Lab 04/23/15 0529  AMMONIA 45*   CBC:  Recent Labs Lab 04/22/15 2048 04/23/15 0529 04/24/15 0235  WBC 8.5 8.9 5.6  NEUTROABS  --  6.6  --   HGB 11.5* 11.5* 10.8*  HCT 36.6 36.2 34.9*  MCV 79.9 79.6 81.5  PLT 318 325 268   Cardiac Enzymes:  Recent Labs Lab 04/23/15 0529  TROPONINI 0.03   BNP: Invalid input(s): POCBNP CBG:  Recent Labs Lab 04/22/15 2102  GLUCAP 119*    Recent Results (from the past 240 hour(s))  Gram stain     Status: None   Collection Time: 04/22/15 11:39 PM  Result Value Ref Range Status  Specimen Description URINE, CATHETERIZED  Final   Special Requests NONE  Final   Gram Stain   Final    CYTOSPIN SLIDE WBC PRESENT, PREDOMINANTLY PMN Multiple bacterial morphotypes present, none predominant. Suggest appropriate recollection if clinically indicated. RESULT CALLED TO, READ BACK BY AND VERIFIED WITH: NOTIFIED J WATTS,RN 04/23/15 0030 BY RHOLMES    Report Status 04/23/2015 FINAL  Final  MRSA PCR Screening     Status: None   Collection Time: 04/23/15  6:47 AM  Result Value Ref Range Status   MRSA by PCR NEGATIVE NEGATIVE Final    Comment:        The GeneXpert MRSA Assay (FDA approved for NASAL specimens only), is one component of a comprehensive MRSA colonization surveillance program. It is not intended to  diagnose MRSA infection nor to guide or monitor treatment for MRSA infections.      Studies:              All Imaging reviewed and is as per above notation   Scheduled Meds: . allopurinol  300 mg Oral Daily  . aspirin  325 mg Oral Daily  . budesonide-formoterol  2 puff Inhalation BID  . cefTRIAXone (ROCEPHIN)  IV  1 g Intravenous QHS  . cloNIDine  0.1 mg Transdermal Q Thu  . DULoxetine  60 mg Oral Daily  . feeding supplement (ENSURE ENLIVE)  237 mL Oral BID BM  . haloperidol lactate      . heparin  5,000 Units Subcutaneous 3 times per day  . irbesartan  150 mg Oral Daily  . isosorbide mononitrate  30 mg Oral Daily  . levothyroxine  125 mcg Oral QAC breakfast  . metoprolol  50 mg Oral BID  . pneumococcal 23 valent vaccine  0.5 mL Intramuscular Tomorrow-1000  . sodium chloride  3 mL Intravenous Q12H  . thiamine  100 mg Intravenous Daily   Continuous Infusions: . sodium chloride 50 mL/hr (04/25/15 1200)     Assessment/Plan:   Toxic metabolic encephalopathy 2/2 ethanol use VS pyelonephritis. Patient home meds include Cymbalta 60--CT head noncontributory but microvascular insults suggestive of small TIAs.   MRI brain could not be performed-clinically not indicated as no longer confused blood pressure better and no clinical signs and symptoms of stroke- I suspect this is more withdrawn than hepatic encephalopathy-observe for clinical improvement   Likely pyelonephritis-unfortunately urine culture was not performed-she had positive nitrites plus ketones and small leukocytes with many bacteria--we can treat with empiric Rocephin till 5/17   Hypertensive emergency? Press as more awake continue home dose of metoprolol 50 twice a day as well as ARB Avapro 150 daily if she is able to tolerate an uptitrate as needed, continue clonidine patch as well-she does have when necessary hydralazine 20 mg every 4 when necessary.  Added Imdur 30 -imrpoved  ETosm-d/w son Saralyn Pilar on telephone on  5/15-Nursing recommended to kindly continue Ativan protocol with [facility] for withdrawal --if more agitated order palced for Night coverage to give librium loading dose 50 mg  Reported B12 deficiency likely secondary to ethanolism-monitor as an outpatient  Prior history chronic kidney disease-improved    Code Status: Full Family Communication: called son-5/15 Disposition Plan: continue on tele   Verneita Griffes, MD  [facility] Pager [phone] 04/26/2015, 6:34 PM    LOS: 3 days

## 2015-04-26 NOTE — Progress Notes (Signed)
Brief Nutrition Follow-Up Note  Initial Nutrition Assessment completed on 04/24/15- please see note for further details.   Pt remains lethargic and irritable. She has been advanced to a heart healthy diet on 04/25/15,as she was requesting to eat. Pt was asleep at time of visit; noted 25% of lunch meal completed. RD will add Ensure Enlive po BID, each supplement provides 350 kcal and 20 grams of protein, for additional nutrition support.  RD will continue to follow.   Mabrey Howland A. Jimmye Norman, RD, LDN, CDE Pager: [phone] After hours Pager: [phone]

## 2015-04-26 NOTE — Progress Notes (Signed)
Utilization Review completed. Magda Muise RN BSN CM 

## 2015-04-27 DIAGNOSIS — G92 Toxic encephalopathy: Secondary | ICD-10-CM | POA: Diagnosis not present

## 2015-04-27 LAB — BASIC METABOLIC PANEL
Anion gap: 9 (ref 5–15)
BUN: <5 mg/dL — ABNORMAL LOW (ref 6–20)
CO2: 22 mmol/L (ref 22–32)
Calcium: 8.7 mg/dL — ABNORMAL LOW (ref 8.9–10.3)
Chloride: 107 mmol/L (ref 101–111)
Creatinine, Ser: 0.79 mg/dL (ref 0.44–1.00)
GFR calc Af Amer: >60 mL/min (ref >60)
GFR calc non Af Amer: >60 mL/min (ref >60)
Glucose, Bld: 93 mg/dL (ref 65–99)
Potassium: 2.8 mmol/L — ABNORMAL LOW (ref 3.5–5.1)
Sodium: 138 mmol/L (ref 135–145)

## 2015-04-27 LAB — CBC WITH DIFFERENTIAL/PLATELET
Basophils Absolute: 0 10*3/uL (ref 0.0–0.1)
Basophils Relative: 0 % (ref 0–1)
Eosinophils Absolute: 0.1 10*3/uL (ref 0.0–0.7)
Eosinophils Relative: 2 % (ref 0–5)
HCT: 29.5 % — ABNORMAL LOW (ref 36.0–46.0)
Hemoglobin: 9.1 g/dL — ABNORMAL LOW (ref 12.0–15.0)
Lymphocytes Relative: 27 % (ref 12–46)
Lymphs Abs: 1.1 10*3/uL (ref 0.7–4.0)
MCH: 24.9 pg — ABNORMAL LOW (ref 26.0–34.0)
MCHC: 30.8 g/dL (ref 30.0–36.0)
MCV: 80.6 fL (ref 78.0–100.0)
Monocytes Absolute: 0.4 10*3/uL (ref 0.1–1.0)
Monocytes Relative: 10 % (ref 3–12)
Neutro Abs: 2.6 10*3/uL (ref 1.7–7.7)
Neutrophils Relative %: 61 % (ref 43–77)
Platelets: 193 10*3/uL (ref 150–400)
RBC: 3.66 MIL/uL — ABNORMAL LOW (ref 3.87–5.11)
RDW: 15.6 % — ABNORMAL HIGH (ref 11.5–15.5)
WBC: 4.2 10*3/uL (ref 4.0–10.5)

## 2015-04-27 LAB — AMMONIA: Ammonia: 9 umol/L (ref 9–35)

## 2015-04-27 MED ORDER — METOPROLOL TARTRATE 100 MG PO TABS
100.0000 mg | ORAL_TABLET | Freq: Two times a day (BID) | ORAL | Status: DC
  Administered 2015-04-27 – 2015-04-29 (×5): 100 mg via ORAL
  Filled 2015-04-27 (×6): qty 1

## 2015-04-27 MED ORDER — LEVOTHYROXINE SODIUM 75 MCG PO TABS
75.0000 ug | ORAL_TABLET | Freq: Every day | ORAL | Status: DC
  Administered 2015-04-28 – 2015-04-29 (×2): 75 ug via ORAL
  Filled 2015-04-27 (×3): qty 1

## 2015-04-27 MED ORDER — POTASSIUM CHLORIDE CRYS ER 20 MEQ PO TBCR
40.0000 meq | EXTENDED_RELEASE_TABLET | Freq: Two times a day (BID) | ORAL | Status: DC
  Administered 2015-04-27 – 2015-04-28 (×3): 40 meq via ORAL
  Filled 2015-04-27 (×4): qty 2

## 2015-04-27 MED ORDER — QUETIAPINE FUMARATE 25 MG PO TABS
25.0000 mg | ORAL_TABLET | Freq: Every day | ORAL | Status: DC
  Filled 2015-04-27 (×2): qty 1

## 2015-04-27 NOTE — Consult Note (Signed)
[facility] Consult   Reason for Consult:  Capacity evaluation, AMS vs Alcohol withdrawal delirium Referring Physician:  Dr. Verlon Au Patient Identification: ANSHIKA PETHTEL MRN:  [id number] Principal Diagnosis: Alcohol withdrawal delirium Diagnosis:   Patient Active Problem List   Diagnosis Date Noted  . Altered mental status [R41.82] 04/23/2015  . Thyrotoxic crisis [E05.91] 04/23/2015  . Acute encephalopathy [G93.40] 04/23/2015  . Hypertensive emergency [I10] 04/23/2015  . Alcohol withdrawal delirium [F10.231] 04/23/2015  . Alcohol abuse [F10.10] 10/21/2013  . 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 Carotid artery narrowing [I65.29] 10/03/2012  . Orthostatic syncope [I95.1] 10/02/2012  . Hypokalemia [E87.6] 10/02/2012  . Hypothyroidism [E03.9] 10/02/2012  . Acute renal failure [N17.9] 01/09/2012  . HTN (hypertension) [I10] 01/09/2012  . Hyperlipidemia [E78.5] 01/09/2012  . TIA (transient ischemic attack) [G45.9] 01/09/2012  . Hypovolemia dehydration [E86.1] 01/09/2012  . Metabolic acidosis [G88.1] 01/09/2012  . Hyponatremia [E87.1] 01/09/2012  . Anemia [D64.9] 01/09/2012  . Alcoholism [F10.20] 01/09/2012  . B12 deficiency [E53.8] 01/09/2012    Total Time spent with patient: 45 minutes  Subjective:   [name] is a 69 y.o. female patient admitted with AMS.  HPI: [name] is a 69 years old female admitted to [facility] with altered mental status, waxing and waning mental status and poor compliance with her medication management. Patient appeared sitting in a chair and her food tray was in front of her but not trying to eat. Patient appeared staring at the TV and has significant psychomotor retardation, delayed verbal responses and sometimes not able to respond or complete sentence. Patient is a poor historian and information obtained from the medical records, face-to-face evaluation and discussion with the Dr.  Verlon Au about her ongoing alcohol abuse versus dependence, lost job, unable to care for herself and current capacity to to make medical decisions and living arrangements. Reportedly patient has been drinking vodka  about 7-9 shots daily morning but minimizes saying that she drinks only 1 shot a day. Patient son worried about mother and won't  be able to stop drinking herself without help. Patient has poor cognitive functions including orientation, concentration, immediate and delayed memory and language functions. Patient denies symptoms of mania, psychosis, suicidal and homicidal ideation, intention or plans. Spoke with patient son who is concern about her safety and in agreement with treatment and plan of caring her out of home if possible. Reportedly she has been out of work from November 2014 due to TIA.  Past psychiatric history: Patient denies history of alcohol detox treatment and rehabilitation treatment and inpatient psychiatric hospitalization.   Social history: Patient reportedly worked for [employer] in [place] and lost her job because she was not able to function. Patient son concerned about her uncontrollable drinking problem and unable to care for herself. No family history of mental illness  Medical history: Patient is a  69 y/o ? 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 She was seen by [place] neurologist Dr. Loretta Plume and was referred for neuropsychological testing to determine whether there was some psychogenic versus organic pathology. An EEG was performed Admitted to step down unit for close monitoring because of waxing and waning mentation, hypertensive encephalopathy? Thyrotoxic crisis and potential EtOH withdrawal delirium-her CIWA on admission was around 11 and has ranged  in the 6-7 range. On admission she  had some slurred speech and CT of the head showed severe microvascular changes but no acute issue it was noted her urinalysis was positive and hence she was started on Rocephin. MRI of the brain could not be performed and as blood pressure trended down We cancelled It. Because of persisting confusion despite low CIWA, we involved psychiatry in her care 5/17   HPI Elements:  Location:  Delirium. Quality:  Poor. Severity:  Unable to care for herself. Timing:  Alcohol abuse versus dependence. Duration:  Few days. Context:  psychosocial stressors, loss of job and alcohol abuse Versus dependence.  Past Medical History:  Past Medical History  Diagnosis Date  . Hypertension   . Menopause   . Hypothyroid   . Mini stroke   . Hyperlipidemia   . B12 deficiency   . Shortness of breath   . Headache(784.0)   . Anxiety   . Pneumonia   . Anemia   . Depression   . Stroke 2010    no deficits  . Alcoholism 01/09/2012  . Hyponatremia 01/09/2012  . Chronic kidney disease (CKD), stage II (mild)   . COPD (chronic obstructive pulmonary disease)   . Orthostatic syncope 10/02/2012  . Carotid artery narrowing 10/03/2012    On the right.    Past Surgical History  Procedure Laterality Date  . Appendectomy    . Tonsillectomy    . Cataract extraction w/phaco  06/20/2012    Procedure: CATARACT EXTRACTION PHACO AND INTRAOCULAR LENS PLACEMENT (IOC);  Surgeon: Tonny Branch, MD;  Location: [facility];  Service: Ophthalmology;  Laterality: Right;  CDE:10.66  . Cataract extraction w/phaco  07/11/2012    Procedure: CATARACT EXTRACTION PHACO AND INTRAOCULAR LENS PLACEMENT (IOC);  Surgeon: Tonny Branch, MD;  Location: [facility];  Service: Ophthalmology;  Laterality: Left;  CDE: 12.69   Family History:  Family History  Problem Relation Age of Onset  . GER disease Mother   . Hypertension Maternal Grandmother    Social History:  History  Alcohol Use  . 7.0 oz/week  . 14 drink(s) per  week     History  Drug Use No    History   Social History  . Marital Status: Widowed    Spouse Name: N/A  . Number of Children: N/A  . Years of Education: N/A   Social History Main Topics  . Smoking status: Former Smoker -- 2.00 packs/day for 30 years    Types: Cigarettes    Quit date: 06/17/1992  . Smokeless tobacco: Not on file  . Alcohol Use: 7.0 oz/week    14 drink(s) per week  . Drug Use: No  . Sexual Activity: Not on file   Other Topics Concern  . None   Social History Narrative   Additional Social History:                          Allergies:   Allergies  Allergen Reactions  . Norvasc [Amlodipine Besylate] Hives  . Procardia [Nifedipine] Swelling    Edema      Labs:  Results for orders placed or performed during the hospital encounter of 04/22/15 (from the past 48 hour(s))  Basic metabolic panel     Status: Abnormal   Collection Time: 04/27/15  8:20 AM  Result Value Ref Range   Sodium 138 135 - 145 mmol/L   Potassium 2.8 (L) 3.5 - 5.1 mmol/L   Chloride 107 101 - 111 mmol/L   CO2 22 22 - 32  mmol/L   Glucose, Bld 93 65 - 99 mg/dL   BUN <5 (L) 6 - 20 mg/dL   Creatinine, Ser 0.79 0.44 - 1.00 mg/dL   Calcium 8.7 (L) 8.9 - 10.3 mg/dL   GFR calc non Af Amer >60 >60 mL/min   GFR calc Af Amer >60 >60 mL/min    Comment: (NOTE) The eGFR has been calculated using the CKD EPI equation. This calculation has not been validated in all clinical situations. eGFR's persistently <60 mL/min signify possible Chronic Kidney Disease.    Anion gap 9 5 - 15  CBC with Differential/Platelet     Status: Abnormal   Collection Time: 04/27/15  8:20 AM  Result Value Ref Range   WBC 4.2 4.0 - 10.5 K/uL   RBC 3.66 (L) 3.87 - 5.11 MIL/uL   Hemoglobin 9.1 (L) 12.0 - 15.0 g/dL   HCT 29.5 (L) 36.0 - 46.0 %   MCV 80.6 78.0 - 100.0 fL   MCH 24.9 (L) 26.0 - 34.0 pg   MCHC 30.8 30.0 - 36.0 g/dL   RDW 15.6 (H) 11.5 - 15.5 %   Platelets 193 150 - 400 K/uL   Neutrophils  Relative % 61 43 - 77 %   Neutro Abs 2.6 1.7 - 7.7 K/uL   Lymphocytes Relative 27 12 - 46 %   Lymphs Abs 1.1 0.7 - 4.0 K/uL   Monocytes Relative 10 3 - 12 %   Monocytes Absolute 0.4 0.1 - 1.0 K/uL   Eosinophils Relative 2 0 - 5 %   Eosinophils Absolute 0.1 0.0 - 0.7 K/uL   Basophils Relative 0 0 - 1 %   Basophils Absolute 0.0 0.0 - 0.1 K/uL    Vitals: Blood pressure 176/83, pulse 83, temperature 98.3 F (36.8 C), temperature source Oral, resp. rate 18, height _0  (1.778 m), weight 71.4 kg (157 lb 6.5 oz), last menstrual period 08/21/2013, SpO2 97 %.  Risk to Self: Is patient at risk for suicide?: No Risk to Others:   Prior Inpatient Therapy:   Prior Outpatient Therapy:    Current Facility-Administered Medications  Medication Dose Route Frequency Provider Last Rate Last Dose  . allopurinol (ZYLOPRIM) tablet 300 mg  300 mg Oral Daily Nita Sells, MD   300 mg at 04/27/15 1002  . aspirin tablet 325 mg  325 mg Oral Daily Nita Sells, MD   325 mg at 04/27/15 1002  . budesonide-formoterol (SYMBICORT) 160-4.5 MCG/ACT inhaler 2 puff  2 puff Inhalation BID Nita Sells, MD   2 puff at 04/27/15 0844  . cloNIDine (CATAPRES - Dosed in mg/24 hr) patch 0.1 mg  0.1 mg Transdermal Q Thu Lavina Hamman, MD   0.1 mg at 04/23/15 0528  . DULoxetine (CYMBALTA) DR capsule 60 mg  60 mg Oral Daily Nita Sells, MD   60 mg at 04/27/15 1003  . feeding supplement (ENSURE ENLIVE) (ENSURE ENLIVE) liquid 237 mL  237 mL Oral BID BM Jenifer A Williams, RD   237 mL at 04/27/15 1005  . heparin injection 5,000 Units  5,000 Units Subcutaneous 3 times per day Lavina Hamman, MD   5,000 Units at 04/27/15 [phone]  . hydrALAZINE (APRESOLINE) injection 20 mg  20 mg Intravenous Q4H PRN Nita Sells, MD   20 mg at 04/26/15 1108  . irbesartan (AVAPRO) tablet 150 mg  150 mg Oral Daily Nita Sells, MD   150 mg at 04/27/15 1002  . isosorbide mononitrate (IMDUR) 24 hr tablet 30 mg  30 mg  Oral Daily Nita Sells, MD   30 mg at 04/27/15 1003  . labetalol (NORMODYNE,TRANDATE) injection 10 mg  10 mg Intravenous Q2H PRN Nita Sells, MD   10 mg at 04/27/15 1003  . [START ON 04/28/2015] levothyroxine (SYNTHROID, LEVOTHROID) tablet 75 mcg  75 mcg Oral QAC breakfast Nita Sells, MD      . LORazepam (ATIVAN) injection 2-3 mg  2-3 mg Intravenous Q1H PRN Lavina Hamman, MD   2 mg at 04/26/15 0112  . metoprolol (LOPRESSOR) tablet 100 mg  100 mg Oral BID Nita Sells, MD   100 mg at 04/27/15 1002  . ondansetron (ZOFRAN) tablet 4 mg  4 mg Oral Q6H PRN Lavina Hamman, MD       Or  . ondansetron [facility]) injection 4 mg  4 mg Intravenous Q6H PRN Lavina Hamman, MD      . potassium chloride SA (K-DUR,KLOR-CON) CR tablet 40 mEq  40 mEq Oral BID Nita Sells, MD   40 mEq at 04/27/15 1025  . sodium chloride 0.9 % injection 3 mL  3 mL Intravenous Q12H Lavina Hamman, MD   3 mL at 04/27/15 1005  . thiamine (B-1) injection 100 mg  100 mg Intravenous Daily Lavina Hamman, MD   100 mg at 04/27/15 1004    Musculoskeletal: Strength & Muscle Tone: decreased Gait & Station: unable to stand Patient leans: N/A  Psychiatric Specialty Exam: Physical [place] per history and physical   ROS unable to perform secondary to current mental status   Blood pressure 176/83, pulse 83, temperature 98.3 F (36.8 C), temperature source Oral, resp. rate 18, height _0  (1.778 m), weight 71.4 kg (157 lb 6.5 oz), last menstrual period 08/21/2013, SpO2 97 %.Body mass index is 22.59 kg/(m^2).  General Appearance: Guarded  Eye Contact::  Minimal  Speech:  Blocked and Slow  Volume:  Decreased  Mood:  Depressed and Worthless  Affect:  Blunt and Depressed  Thought Process:  Disorganized, Irrelevant and Loose  Orientation:  Negative  Thought Content:  Rumination  Suicidal Thoughts:  No  Homicidal Thoughts:  No  Memory:  Immediate;   Poor Recent;   Poor  Judgement:  Impaired  Insight:   Lacking  Psychomotor Activity:  Psychomotor Retardation  Concentration:  Poor  Recall:  [place] of Knowledge:Fair  Language: Fair  Akathisia:  Negative  Handed:  Right  AIMS (if indicated):     Assets:  Desire for Improvement Financial Resources/Insurance Leisure Time Physical Health Resilience Social Support  ADL's:  Impaired  Cognition: Impaired,  Moderate  Sleep:      Medical Decision Making: New problem, with additional work up planned, Review of Psycho-Social Stressors (1), Review or order clinical lab tests (1), Discuss test with performing physician (1), Established Problem, Worsening (2), Review or order medicine tests (1), Review of Medication Regimen & Side Effects (2) and Review of New Medication or Change in Dosage (2)  Treatment Plan Summary: Daily contact with patient to assess and evaluate symptoms and progress in treatment and Medication management  Plan:  Capacity evaluation: Patient does not meet criteria for capacity make her own medical decisions or living arrangements  Refer to the psychiatric social service regarding possible medical care power of attorney.  Alcohol withdrawal delirium: Provide supportive therapy including thiamine injections 100 mg daily Monitor for CIWA  Depression:  Continue Cymbalta 60 mg daily  Hypothyroidism:  Levothyroxine supplementation and monitoring of the TSH and free T3  Insomnia:  Seroquel  25 mg at bed time  Patient does not meet criteria for psychiatric inpatient admission. Supportive therapy provided about ongoing stressors.   Appreciate psychiatric consultation and follow up as clinically required Please contact [phone] or [phone] if needs further assistance  Disposition: Patient benefit from out-of-home placement at a skilled nursing facility when medically stable and improved mental status  Cliff Damiani,JANARDHAHA R. 04/27/2015 12:26 PM

## 2015-04-27 NOTE — Clinical Social Work Placement (Signed)
   CLINICAL SOCIAL WORK PLACEMENT  NOTE  Date:  04/27/2015  Patient Details  Name: [name] MRN: [id number] Date of Birth: 1946/02/05  Clinical Social Work is seeking post-discharge placement for this patient at the [place] level of care (*CSW will initial, date and re-position this form in  chart as items are completed):  Yes   Patient/family provided with [place] Work Department's list of facilities offering this level of care within the geographic area requested by the patient (or if unable, by the patient's family).  Yes   Patient/family informed of their freedom to choose among providers that offer the needed level of care, that participate in Medicare, Medicaid or managed care program needed by the patient, have an available bed and are willing to accept the patient.  Yes   Patient/family informed of [facility]'s ownership interest in [facility] and [facility], as well as of the fact that they are under no obligation to receive care at these facilities.  PASRR submitted to EDS on 04/27/15     PASRR number received on       Existing PASRR number confirmed on       FL2 transmitted to all facilities in geographic area requested by pt/family on 04/27/15     FL2 transmitted to all facilities within larger geographic area on       Patient informed that his/her managed care company has contracts with or will negotiate with certain facilities, including the following:            Patient/family informed of bed offers received.  Patient chooses bed at       Physician recommends and patient chooses bed at      Patient to be transferred to   on  .  Patient to be transferred to facility by       Patient family notified on   of transfer.  Name of family member notified:        PHYSICIAN       Additional Comment:    _______________________________________________ Rigoberto Noel, LCSW 04/27/2015, 5:03 PM

## 2015-04-27 NOTE — Progress Notes (Signed)
[name]:[id number] DOB: [birth year] DOA: 04/22/2015 PCP: Kelsey Gainer, MD  Brief narrative: 69 y/o ? 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 Loretta Gross and was referred for neuropsychological testing to determine whether there was some psychogenic versus organic pathology An EEG was performed Admitted to step down unit for close monitoring because of waxing and waning mentation, hypertensive encephalopathy? Thyrotoxic crisis and potential EtOH withdrawal delirium-her CIWA on admission was around 11 and has ranged in the 6-7 range On admission she had some slurred speech and CT of the head showed severe microvascular changes but no acute issue   it was noted her urinalysis was positive and hence she was started on Rocephin. MRI of the brain could not be performed and as blood pressure trended down  We cancelled It Because of persisting confusion despite low CIWA, we involved psychiatry in her care 5/17   Past medical history-As per Problem list Chart reviewed as below- reviewed  Consultants:  None so far  Procedures:  CT head  Antibiotics:  Rocephin   Subjective   Confused Not hearing voices No sleep at all last night Wasn't combative however Not eating Cannot orient to where she is  None Objective    Interim History: none  Telemetry: none   Objective: Filed Vitals:   04/27/15 0332 04/27/15 0400 04/27/15 0555 04/27/15 0845  BP: 169/91  175/90   Pulse: 87 87 87   Temp: 98.7 F (37.1 C)  98.3 F (36.8 C)   TempSrc: Oral  Oral   Resp: 17  17   Height:      Weight:   71.4 kg (157 lb 6.5 oz)   SpO2: 97%  97% 98%    Intake/Output Summary (Last 24 hours) at 04/27/15 0954 Last data filed at  04/27/15 0916  Gross per 24 hour  Intake   1060 ml  Output   1050 ml  Net     10 ml    Exam:  EOMI NCAT-confused-cannot answer orientation q's Chest clinically clear S1-S2 no murmur rub or gallop Alert x 4 Power however seems 5/5     Data Reviewed: Basic Metabolic Panel:  Recent Labs Lab 04/22/15 2048 04/23/15 0529 04/24/15 0235 04/27/15 0820  NA 134* 135 139 138  K 3.3* 3.8 3.4* 2.8*  CL 98* 99* 105 107  CO2 22 22 23 22   GLUCOSE 128* 143* 103* 93  BUN 11 8 7  <5*  CREATININE 0.96 0.87 0.81 0.79  CALCIUM 9.5 9.4 8.9 8.7*   Liver Function Tests:  Recent Labs Lab 04/22/15 2048 04/23/15 0529  AST 15 25  ALT 11* 12*  ALKPHOS 123 125  BILITOT 1.1 1.6*  PROT 7.2 7.8  ALBUMIN 4.0 3.9   No results for input(s): LIPASE, AMYLASE in the last 168 hours.  Recent Labs Lab 04/23/15 0529  AMMONIA 45*   CBC:  Recent Labs Lab 04/22/15 2048 04/23/15 0529 04/24/15 0235 04/27/15 0820  WBC 8.5 8.9 5.6 4.2  NEUTROABS  --  6.6  --  2.6  HGB 11.5* 11.5* 10.8* 9.1*  HCT 36.6 36.2 34.9* 29.5*  MCV 79.9 79.6 81.5 80.6  PLT 318 325 268 193   Cardiac Enzymes:  Recent Labs Lab 04/23/15 0529  TROPONINI 0.03   BNP: Invalid input(s): POCBNP CBG:  Recent Labs Lab 04/22/15  2102  GLUCAP 119*    Recent Results (from the past 240 hour(s))  Gram stain     Status: None   Collection Time: 04/22/15 11:39 PM  Result Value Ref Range Status   Specimen Description URINE, CATHETERIZED  Final   Special Requests NONE  Final   Gram Stain   Final    CYTOSPIN SLIDE WBC PRESENT, PREDOMINANTLY PMN Multiple bacterial morphotypes present, none predominant. Suggest appropriate recollection if clinically indicated. RESULT CALLED TO, READ BACK BY AND VERIFIED WITH: NOTIFIED J WATTS,RN 04/23/15 0030 BY RHOLMES    Report Status 04/23/2015 FINAL  Final  MRSA PCR Screening     Status: None   Collection Time: 04/23/15  6:47 AM  Result Value Ref Range Status   MRSA by PCR NEGATIVE  NEGATIVE Final    Comment:        The GeneXpert MRSA Assay (FDA approved for NASAL specimens only), is one component of a comprehensive MRSA colonization surveillance program. It is not intended to diagnose MRSA infection nor to guide or monitor treatment for MRSA infections.      Studies:              All Imaging reviewed and is as per above notation   Scheduled Meds: . allopurinol  300 mg Oral Daily  . aspirin  325 mg Oral Daily  . budesonide-formoterol  2 puff Inhalation BID  . cloNIDine  0.1 mg Transdermal Q Thu  . DULoxetine  60 mg Oral Daily  . feeding supplement (ENSURE ENLIVE)  237 mL Oral BID BM  . heparin  5,000 Units Subcutaneous 3 times per day  . irbesartan  150 mg Oral Daily  . isosorbide mononitrate  30 mg Oral Daily  . [START ON 04/28/2015] levothyroxine  75 mcg Oral QAC breakfast  . metoprolol  100 mg Oral BID  . potassium chloride  40 mEq Oral BID  . sodium chloride  3 mL Intravenous Q12H  . thiamine  100 mg Intravenous Daily   Continuous Infusions: . sodium chloride 100 mL/hr at 04/26/15 2206     Assessment/Plan:   Toxic metabolic encephalopathy 2/2 ethanol use VS pyelonephritis. Patient home meds include Cymbalta 60--CT head noncontributory but microvascular insults suggestive of small TIAs.   MRI brain could not be performed-clinically not indicated as no longer confused blood pressure better and no clinical signs and symptoms of stroke, also unlikely PRES-we will obtain a ammonia level and start empiric lactulose with hold instructions-this could also be psychogenic and we have enlisted psychiatry to see her.  Treated empirically for pyelonephritis-unfortunately urine culture was not performed-she had positive nitrites plus ketones and small leukocytes with many bacteria-- Rocephin till 5/17-CBc + Diff in am-monitor fever curve  Hypertensive emergency,? Pres syndrome- as more awake continue home dose of metoprolol 50 twice a day-->100 bid 5/17.   continue ARB Avapro 150 dailycontinue clonidine patch as well-she does have when necessary hydralazine 20 mg every 4 when necessary.  Added Imdur 30 -still elevated-changed meds on 5/17--monitor  Hypokalemia related to chronic ETOH use adn withdrawal-check am magensium-replace aggressively c Po Kdur 40 bid [5/17]  ETosm-d/w son Saralyn Pilar on telephone on 5/17  Reported B12 deficiency likely secondary to ethanolism-monitor as an outpatient  Prior history chronic kidney disease-improved Hyperthryroid-iatrogenic on synthroid 125 mcg-TSH ?, t4 ?--cut back synthroid 125-->50 on 5/176 Microcytic anemai with dilutional compenent as was on NS 100 cc/hr-monitor trend and nursing to inform if dark stools    Code Status: Full Family Communication:  d/w son patrick 5/17-understands Disposition Plan: continue on tele as tachy   Verneita Griffes, MD  [facility] Pager [phone] 04/27/2015, 9:54 AM    LOS: 4 days

## 2015-04-27 NOTE — Clinical Social Work Note (Signed)
Clinical Social Work Assessment  Patient Details  Name: [name] MRN: [id number] Date of Birth: 11/18/1946  Date of referral:  04/27/15               Reason for consult:  Substance Use/ETOH Abuse, Discharge Planning                Permission sought to share information with:  Family Supports, Chartered certified accountant granted to share information::  Yes, Verbal Permission Granted  Name::        Agency::     Relationship::     Contact Information:     Housing/Transportation Living arrangements for the past 2 months:  Single Family Home Source of Information:  Patient Patient Interpreter Needed:  None Criminal Activity/Legal Involvement Pertinent to Current Situation/Hospitalization:  No - Comment as needed Significant Relationships:  Adult Children Lives with:  Adult Children Do you feel safe going back to the place where you live?  Yes Need for family participation in patient care:  Yes (Comment)  Care giving concerns:  Patient does feel that she would benefit from a short term rehab stay.    Social Worker assessment / plan:  CSW met with patient at bedside to complete assessment. Patient appeared appropriate and was able to answer CSW's questions appropriately. The patient was admitted from home with her and states that she will be in agreement with short term SNF placement at discharge. Patient states that it is ok to involve the patient's son in discharge planning. CSW has left messages for the patient's son. CSW explained SNF search/placement process and answered patient's questions. CSW will follow up with available bed offers.   Employment status:  Retired Forensic scientist:  Medicare PT Recommendations:  [place] / Referral to community resources:  [place]  Patient/Family's Response to care:  Patient still seems to be slightly confused.  Patient/Family's Understanding of and Emotional Response to  Diagnosis, Current Treatment, and Prognosis:  Patient seems to have limited insight into reason for her admission.   Emotional Assessment Appearance:  Appears stated age Attitude/Demeanor/Rapport:  Other (Appropriate) Affect (typically observed):  Accepting, Appropriate, Calm, Flat Orientation:  Oriented to Self, Oriented to Place Alcohol / Substance use:  Alcohol Use, Tobacco Use (HX of tobacco use, current ETOH use.) Psych involvement (Current and /or in the community):  Yes (Comment)  Discharge Needs  Concerns to be addressed:  Discharge Planning Concerns, Substance Abuse Concerns Readmission within the last 30 days:  No Current discharge risk:  Physical Impairment, Cognitively Impaired Barriers to Discharge:  Continued Medical Work up   [place], LCSW 04/27/2015, 4:20 PM

## 2015-04-27 NOTE — Progress Notes (Signed)
Patient's son Saralyn Pilar at bedside and states that he would like to speak with attending MD before patient takes seroquel 25 mg PO dose. Family refused tonight's dose.

## 2015-04-28 ENCOUNTER — Inpatient Hospital Stay (HOSPITAL_COMMUNITY): Payer: Medicare Other

## 2015-04-28 DIAGNOSIS — F10231 Alcohol dependence with withdrawal delirium: Secondary | ICD-10-CM

## 2015-04-28 DIAGNOSIS — N390 Urinary tract infection, site not specified: Secondary | ICD-10-CM | POA: Diagnosis present

## 2015-04-28 DIAGNOSIS — G9340 Encephalopathy, unspecified: Secondary | ICD-10-CM

## 2015-04-28 DIAGNOSIS — I10 Essential (primary) hypertension: Secondary | ICD-10-CM

## 2015-04-28 DIAGNOSIS — R4182 Altered mental status, unspecified: Secondary | ICD-10-CM

## 2015-04-28 DIAGNOSIS — I160 Hypertensive urgency: Secondary | ICD-10-CM | POA: Insufficient documentation

## 2015-04-28 LAB — PROTIME-INR
INR: 1.07 (ref 0.00–1.49)
Prothrombin Time: 14.1 seconds (ref 11.6–15.2)

## 2015-04-28 LAB — COMPREHENSIVE METABOLIC PANEL
ALT: 12 U/L — ABNORMAL LOW (ref 14–54)
AST: 18 U/L (ref 15–41)
Albumin: 3.2 g/dL — ABNORMAL LOW (ref 3.5–5.0)
Alkaline Phosphatase: 81 U/L (ref 38–126)
Anion gap: 8 (ref 5–15)
BUN: 8 mg/dL (ref 6–20)
CO2: 21 mmol/L — ABNORMAL LOW (ref 22–32)
Calcium: 9 mg/dL (ref 8.9–10.3)
Chloride: 108 mmol/L (ref 101–111)
Creatinine, Ser: 0.83 mg/dL (ref 0.44–1.00)
GFR calc Af Amer: >60 mL/min (ref >60)
GFR calc non Af Amer: >60 mL/min (ref >60)
Glucose, Bld: 85 mg/dL (ref 65–99)
Potassium: 3.5 mmol/L (ref 3.5–5.1)
Sodium: 137 mmol/L (ref 135–145)
Total Bilirubin: 0.9 mg/dL (ref 0.3–1.2)
Total Protein: 6 g/dL — ABNORMAL LOW (ref 6.5–8.1)

## 2015-04-28 LAB — CBC WITH DIFFERENTIAL/PLATELET
Basophils Absolute: 0 10*3/uL (ref 0.0–0.1)
Basophils Relative: 0 % (ref 0–1)
Eosinophils Absolute: 0.1 10*3/uL (ref 0.0–0.7)
Eosinophils Relative: 2 % (ref 0–5)
HCT: 29.6 % — ABNORMAL LOW (ref 36.0–46.0)
Hemoglobin: 9.2 g/dL — ABNORMAL LOW (ref 12.0–15.0)
Lymphocytes Relative: 33 % (ref 12–46)
Lymphs Abs: 1.4 10*3/uL (ref 0.7–4.0)
MCH: 25.3 pg — ABNORMAL LOW (ref 26.0–34.0)
MCHC: 31.1 g/dL (ref 30.0–36.0)
MCV: 81.5 fL (ref 78.0–100.0)
Monocytes Absolute: 0.5 10*3/uL (ref 0.1–1.0)
Monocytes Relative: 12 % (ref 3–12)
Neutro Abs: 2.2 10*3/uL (ref 1.7–7.7)
Neutrophils Relative %: 53 % (ref 43–77)
Platelets: 171 10*3/uL (ref 150–400)
RBC: 3.63 MIL/uL — ABNORMAL LOW (ref 3.87–5.11)
RDW: 15.8 % — ABNORMAL HIGH (ref 11.5–15.5)
WBC: 4.2 10*3/uL (ref 4.0–10.5)

## 2015-04-28 LAB — URINALYSIS, ROUTINE W REFLEX MICROSCOPIC
Glucose, UA: NEGATIVE mg/dL
Hgb urine dipstick: NEGATIVE
Ketones, ur: 15 mg/dL — AB
Leukocytes, UA: NEGATIVE
Nitrite: NEGATIVE
Protein, ur: NEGATIVE mg/dL
Specific Gravity, Urine: 1.019 (ref 1.005–1.030)
Urobilinogen, UA: 1 mg/dL (ref 0.0–1.0)
pH: 5.5 (ref 5.0–8.0)

## 2015-04-28 MED ORDER — ISOSORBIDE MONONITRATE ER 60 MG PO TB24
60.0000 mg | ORAL_TABLET | Freq: Every day | ORAL | Status: DC
  Administered 2015-04-29: 60 mg via ORAL
  Filled 2015-04-28: qty 1

## 2015-04-28 MED ORDER — HALOPERIDOL LACTATE 5 MG/ML IJ SOLN
1.0000 mg | Freq: Once | INTRAMUSCULAR | Status: AC
  Administered 2015-04-28: 1 mg via INTRAMUSCULAR
  Filled 2015-04-28: qty 1

## 2015-04-28 MED ORDER — HALOPERIDOL LACTATE 5 MG/ML IJ SOLN: 1.0000 mg | Freq: Once | INTRAMUSCULAR | Status: DC

## 2015-04-28 MED ORDER — POTASSIUM CHLORIDE CRYS ER 20 MEQ PO TBCR
40.0000 meq | EXTENDED_RELEASE_TABLET | Freq: Every day | ORAL | Status: DC
  Administered 2015-04-29: 40 meq via ORAL
  Filled 2015-04-28: qty 2

## 2015-04-28 NOTE — Consult Note (Signed)
Psychiatry Consult follow up  Reason for Consult:  Capacity evaluation, AMS vs Alcohol withdrawal delirium Referring Physician:  Dr. Verlon Au Patient Identification: [name] MRN:  [id number] Principal Diagnosis: Alcohol withdrawal delirium Diagnosis:   Patient Active Problem List   Diagnosis Date Noted  . Altered mental status [R41.82] 04/23/2015  . Thyrotoxic crisis [E05.91] 04/23/2015  . Acute encephalopathy [G93.40] 04/23/2015  . Hypertensive emergency [I10] 04/23/2015  . Alcohol withdrawal delirium [F10.231] 04/23/2015  . Alcohol abuse [F10.10] 10/21/2013  . 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 Carotid artery narrowing [I65.29] 10/03/2012  . Orthostatic syncope [I95.1] 10/02/2012  . Hypokalemia [E87.6] 10/02/2012  . Hypothyroidism [E03.9] 10/02/2012  . Acute renal failure [N17.9] 01/09/2012  . HTN (hypertension) [I10] 01/09/2012  . Hyperlipidemia [E78.5] 01/09/2012  . TIA (transient ischemic attack) [G45.9] 01/09/2012  . Hypovolemia dehydration [E86.1] 01/09/2012  . Metabolic acidosis [K08.8] 01/09/2012  . Hyponatremia [E87.1] 01/09/2012  . Anemia [D64.9] 01/09/2012  . Alcoholism [F10.20] 01/09/2012  . B12 deficiency [E53.8] 01/09/2012    Total Time spent with patient: 30 minutes  Subjective:   [name] is a 69 y.o. female patient admitted with AMS.  HPI: [name] is a 69 years old female admitted to [facility] with altered mental status, waxing and waning mental status and poor compliance with her medication management. Patient appeared sitting in a chair and her food tray was in front of her but not trying to eat. Patient appeared staring at the TV and has significant psychomotor retardation, delayed verbal responses and sometimes not able to respond or complete sentence. Patient is a poor historian and information obtained from the medical records, face-to-face evaluation and discussion with the Dr. Verlon Au  about her ongoing alcohol abuse versus dependence, lost job, unable to care for herself and current capacity to to make medical decisions and living arrangements. Reportedly patient has been drinking vodka  about 7-9 shots daily morning but minimizes saying that she drinks only 1 shot a day. Patient son worried about mother and won't  be able to stop drinking herself without help. Patient has poor cognitive functions including orientation, concentration, immediate and delayed memory and language functions. Patient denies symptoms of mania, psychosis, suicidal and homicidal ideation, intention or plans. Spoke with patient son who is concern about her safety and in agreement with treatment and plan of caring her out of home if possible. Reportedly she has been out of work from November 2014 due to TIA.  Past psychiatric history: Patient denies history of alcohol detox treatment and rehabilitation treatment and inpatient psychiatric hospitalization. Social history: Patient reportedly worked for [employer] in [place], [place] lost her job because she was not able to function. Patient son concerned about her uncontrollable drinking problem and unable to care for herself. No family history of mental illness  Interval history: Patient seen today for the psych consultation follow up. Patient appeared lying down on her bed and continue to be confused and has significant cognitive deficits. Patient daughter at bed side stated that her mother has been more lucid prior to taking a nap this morning. It seems like she is having cognitive fluctuation from time to time. She is a poor historian this morning due to memory problems. She could not call her daughter name and unable to tell what she ate this mornng. Reportedly patient has been walking with walker at home for the past two years and wondering how she is accessing her vodka. She had multiple  TIA but denied complete CVA. Patient slept well without seroquel last night and family  does not want to give seroquel due to concern about side effects and drug to drug interactions.    Past Medical History:  Past Medical History  Diagnosis Date  . Hypertension   . Menopause   . Hypothyroid   . Mini stroke   . Hyperlipidemia   . B12 deficiency   . Shortness of breath   . Headache(784.0)   . Anxiety   . Pneumonia   . Anemia   . Depression   . Stroke 2010    no deficits  . Alcoholism 01/09/2012  . Hyponatremia 01/09/2012  . Chronic kidney disease (CKD), stage II (mild)   . COPD (chronic obstructive pulmonary disease)   . Orthostatic syncope 10/02/2012  . Carotid artery narrowing 10/03/2012    On the right.    Past Surgical History  Procedure Laterality Date  . Appendectomy    . Tonsillectomy    . Cataract extraction w/phaco  06/20/2012    Procedure: CATARACT EXTRACTION PHACO AND INTRAOCULAR LENS PLACEMENT (IOC);  Surgeon: Tonny Branch, MD;  Location: [facility];  Service: Ophthalmology;  Laterality: Right;  CDE:10.66  . Cataract extraction w/phaco  07/11/2012    Procedure: CATARACT EXTRACTION PHACO AND INTRAOCULAR LENS PLACEMENT (IOC);  Surgeon: Tonny Branch, MD;  Location: [facility];  Service: Ophthalmology;  Laterality: Left;  CDE: 12.69   Family History:  Family History  Problem Relation Age of Onset  . GER disease Mother   . Hypertension Maternal Grandmother    Social History:  History  Alcohol Use  . 7.0 oz/week  . 14 drink(s) per week     History  Drug Use No    History   Social History  . Marital Status: Widowed    Spouse Name: N/A  . Number of Children: N/A  . Years of Education: N/A   Social History Main Topics  . Smoking status: Former Smoker -- 2.00 packs/day for 30 years    Types: Cigarettes    Quit date: 06/17/1992  . Smokeless tobacco: Not on file  . Alcohol Use: 7.0 oz/week    14 drink(s) per week  . Drug Use: No  . Sexual Activity: Not on file   Other Topics Concern  . None   Social History Narrative   Additional Social  History:                          Allergies:   Allergies  Allergen Reactions  . Norvasc [Amlodipine Besylate] Hives  . Procardia [Nifedipine] Swelling    Edema      Labs:  Results for orders placed or performed during the hospital encounter of 04/22/15 (from the past 48 hour(s))  Basic metabolic panel     Status: Abnormal   Collection Time: 04/27/15  8:20 AM  Result Value Ref Range   Sodium 138 135 - 145 mmol/L   Potassium 2.8 (L) 3.5 - 5.1 mmol/L   Chloride 107 101 - 111 mmol/L   CO2 22 22 - 32 mmol/L   Glucose, Bld 93 65 - 99 mg/dL   BUN <5 (L) 6 - 20 mg/dL   Creatinine, Ser 0.79 0.44 - 1.00 mg/dL   Calcium 8.7 (L) 8.9 - 10.3 mg/dL   GFR calc non Af Amer >60 >60 mL/min   GFR calc Af Amer >60 >60 mL/min    Comment: (NOTE) The eGFR has been calculated using the  CKD EPI equation. This calculation has not been validated in all clinical situations. eGFR's persistently <60 mL/min signify possible Chronic Kidney Disease.    Anion gap 9 5 - 15  CBC with Differential/Platelet     Status: Abnormal   Collection Time: 04/27/15  8:20 AM  Result Value Ref Range   WBC 4.2 4.0 - 10.5 K/uL   RBC 3.66 (L) 3.87 - 5.11 MIL/uL   Hemoglobin 9.1 (L) 12.0 - 15.0 g/dL   HCT 29.5 (L) 36.0 - 46.0 %   MCV 80.6 78.0 - 100.0 fL   MCH 24.9 (L) 26.0 - 34.0 pg   MCHC 30.8 30.0 - 36.0 g/dL   RDW 15.6 (H) 11.5 - 15.5 %   Platelets 193 150 - 400 K/uL   Neutrophils Relative % 61 43 - 77 %   Neutro Abs 2.6 1.7 - 7.7 K/uL   Lymphocytes Relative 27 12 - 46 %   Lymphs Abs 1.1 0.7 - 4.0 K/uL   Monocytes Relative 10 3 - 12 %   Monocytes Absolute 0.4 0.1 - 1.0 K/uL   Eosinophils Relative 2 0 - 5 %   Eosinophils Absolute 0.1 0.0 - 0.7 K/uL   Basophils Relative 0 0 - 1 %   Basophils Absolute 0.0 0.0 - 0.1 K/uL  Ammonia     Status: None   Collection Time: 04/27/15 12:40 PM  Result Value Ref Range   Ammonia 9 9 - 35 umol/L  CBC with Differential/Platelet     Status: Abnormal   Collection  Time: 04/28/15  4:51 AM  Result Value Ref Range   WBC 4.2 4.0 - 10.5 K/uL   RBC 3.63 (L) 3.87 - 5.11 MIL/uL   Hemoglobin 9.2 (L) 12.0 - 15.0 g/dL   HCT 29.6 (L) 36.0 - 46.0 %   MCV 81.5 78.0 - 100.0 fL   MCH 25.3 (L) 26.0 - 34.0 pg   MCHC 31.1 30.0 - 36.0 g/dL   RDW 15.8 (H) 11.5 - 15.5 %   Platelets 171 150 - 400 K/uL   Neutrophils Relative % 53 43 - 77 %   Neutro Abs 2.2 1.7 - 7.7 K/uL   Lymphocytes Relative 33 12 - 46 %   Lymphs Abs 1.4 0.7 - 4.0 K/uL   Monocytes Relative 12 3 - 12 %   Monocytes Absolute 0.5 0.1 - 1.0 K/uL   Eosinophils Relative 2 0 - 5 %   Eosinophils Absolute 0.1 0.0 - 0.7 K/uL   Basophils Relative 0 0 - 1 %   Basophils Absolute 0.0 0.0 - 0.1 K/uL  Comprehensive metabolic panel     Status: Abnormal   Collection Time: 04/28/15  4:51 AM  Result Value Ref Range   Sodium 137 135 - 145 mmol/L   Potassium 3.5 3.5 - 5.1 mmol/L    Comment: DELTA CHECK NOTED   Chloride 108 101 - 111 mmol/L   CO2 21 (L) 22 - 32 mmol/L   Glucose, Bld 85 65 - 99 mg/dL   BUN 8 6 - 20 mg/dL   Creatinine, Ser 0.83 0.44 - 1.00 mg/dL   Calcium 9.0 8.9 - 10.3 mg/dL   Total Protein 6.0 (L) 6.5 - 8.1 g/dL   Albumin 3.2 (L) 3.5 - 5.0 g/dL   AST 18 15 - 41 U/L   ALT 12 (L) 14 - 54 U/L   Alkaline Phosphatase 81 38 - 126 U/L   Total Bilirubin 0.9 0.3 - 1.2 mg/dL   GFR calc non Af Amer >  60 >60 mL/min   GFR calc Af Amer >60 >60 mL/min    Comment: (NOTE) The eGFR has been calculated using the CKD EPI equation. This calculation has not been validated in all clinical situations. eGFR's persistently <60 mL/min signify possible Chronic Kidney Disease.    Anion gap 8 5 - 15  Protime-INR     Status: None   Collection Time: 04/28/15  4:51 AM  Result Value Ref Range   Prothrombin Time 14.1 11.6 - 15.2 seconds   INR 1.07 0.00 - 1.49    Vitals: Blood pressure 162/82, pulse 76, temperature 97.9 F (36.6 C), temperature source Oral, resp. rate 20, height 5' 10"  (1.778 m), weight 71.169 kg (156  lb 14.4 oz), last menstrual period 08/21/2013, SpO2 98 %.  Risk to Self: Is patient at risk for suicide?: No Risk to Others:   Prior Inpatient Therapy:   Prior Outpatient Therapy:    Current Facility-Administered Medications  Medication Dose Route Frequency Provider Last Rate Last Dose  . allopurinol (ZYLOPRIM) tablet 300 mg  300 mg Oral Daily Nita Sells, MD   300 mg at 04/27/15 1002  . aspirin tablet 325 mg  325 mg Oral Daily Nita Sells, MD   325 mg at 04/27/15 1002  . budesonide-formoterol (SYMBICORT) 160-4.5 MCG/ACT inhaler 2 puff  2 puff Inhalation BID Nita Sells, MD   2 puff at 04/28/15 0839  . cloNIDine (CATAPRES - Dosed in mg/24 hr) patch 0.1 mg  0.1 mg Transdermal Q Thu Lavina Hamman, MD   0.1 mg at 04/23/15 0528  . DULoxetine (CYMBALTA) DR capsule 60 mg  60 mg Oral Daily Nita Sells, MD   60 mg at 04/27/15 1003  . feeding supplement (ENSURE ENLIVE) (ENSURE ENLIVE) liquid 237 mL  237 mL Oral BID BM Jenifer A Williams, RD   237 mL at 04/27/15 1409  . heparin injection 5,000 Units  5,000 Units Subcutaneous 3 times per day Lavina Hamman, MD   5,000 Units at 04/28/15 0549  . hydrALAZINE (APRESOLINE) injection 20 mg  20 mg Intravenous Q4H PRN Nita Sells, MD   20 mg at 04/26/15 1108  . irbesartan (AVAPRO) tablet 150 mg  150 mg Oral Daily Nita Sells, MD   150 mg at 04/27/15 1002  . isosorbide mononitrate (IMDUR) 24 hr tablet 30 mg  30 mg Oral Daily Nita Sells, MD   30 mg at 04/27/15 1003  . labetalol (NORMODYNE,TRANDATE) injection 10 mg  10 mg Intravenous Q2H PRN Nita Sells, MD   10 mg at 04/28/15 0549  . levothyroxine (SYNTHROID, LEVOTHROID) tablet 75 mcg  75 mcg Oral QAC breakfast Nita Sells, MD   75 mcg at 04/28/15 0839  . LORazepam (ATIVAN) injection 2-3 mg  2-3 mg Intravenous Q1H PRN Lavina Hamman, MD   2 mg at 04/26/15 0112  . metoprolol (LOPRESSOR) tablet 100 mg  100 mg Oral BID Nita Sells, MD    100 mg at 04/27/15 2257  . ondansetron (ZOFRAN) tablet 4 mg  4 mg Oral Q6H PRN Lavina Hamman, MD       Or  . ondansetron [facility]) injection 4 mg  4 mg Intravenous Q6H PRN Lavina Hamman, MD      . potassium chloride SA (K-DUR,KLOR-CON) CR tablet 40 mEq  40 mEq Oral BID Nita Sells, MD   40 mEq at 04/27/15 2300  . sodium chloride 0.9 % injection 3 mL  3 mL Intravenous Q12H Lavina Hamman, MD   3 mL  at 04/28/15 0552  . thiamine (B-1) injection 100 mg  100 mg Intravenous Daily Lavina Hamman, MD   100 mg at 04/27/15 1004    Musculoskeletal: Strength & Muscle Tone: decreased Gait & Station: unable to stand Patient leans: N/A  Psychiatric Specialty Exam: Physical Exam   ROS   Blood pressure 162/82, pulse 76, temperature 97.9 F (36.6 C), temperature source Oral, resp. rate 20, height 5' 10"  (1.778 m), weight 71.169 kg (156 lb 14.4 oz), last menstrual period 08/21/2013, SpO2 98 %.Body mass index is 22.51 kg/(m^2).  General Appearance: Guarded  Eye Contact::  Minimal  Speech:  Blocked and Slow  Volume:  Decreased  Mood:  Depressed and Worthless  Affect:  Blunt and Depressed  Thought Process:  Disorganized, Irrelevant and Loose  Orientation:  Negative  Thought Content:  Rumination  Suicidal Thoughts:  No  Homicidal Thoughts:  No  Memory:  Immediate;   Poor Recent;   Poor  Judgement:  Impaired  Insight:  Lacking  Psychomotor Activity:  Psychomotor Retardation  Concentration:  Poor  Recall:  [place] of Knowledge:Fair  Language: Fair  Akathisia:  Negative  Handed:  Right  AIMS (if indicated):     Assets:  Desire for Improvement Financial Resources/Insurance Leisure Time Physical Health Resilience Social Support  ADL's:  Impaired  Cognition: Impaired,  Moderate  Sleep:      Medical Decision Making: New problem, with additional work up planned, Review of Psycho-Social Stressors (1), Review or order clinical lab tests (1), Discuss test with performing physician (1),  Established Problem, Worsening (2), Review or order medicine tests (1), Review of Medication Regimen & Side Effects (2) and Review of New Medication or Change in Dosage (2)  Treatment Plan Summary: Daily contact with patient to assess and evaluate symptoms and progress in treatment and Medication management  Plan:  Capacity evaluation: Patient does not meet criteria for capacity make her own medical decisions or living arrangements  Refer to the psychiatric social service regarding possible medical care power of attorney.  Alcohol withdrawal delirium: Provide supportive therapy including thiamine injections 100 mg daily Monitor for CIWA  Depression:  Continue Cymbalta 60 mg daily  Hypothyroidism:  Levothyroxine 75 mcg and monitoring of the TSH and free T3  Insomnia:  Seroquel 25 mg at bed time - discontinue as family concern about side effects  Patient does not meet criteria for psychiatric inpatient admission. Supportive therapy provided about ongoing stressors.   Appreciate psychiatric consultation and follow up as clinically required Please contact [phone] or [phone] if needs further assistance  Disposition: Patient benefit from out-of-home placement at a skilled nursing facility when medically stable and improved mental status  Chloe Bluett,JANARDHAHA R. 04/28/2015 9:19 AM

## 2015-04-28 NOTE — Progress Notes (Addendum)
Physical Therapy Treatment Patient Details Name: [name] MRN: [id number] DOB: 10-Jan-1946 Today's Date: 04/28/2015    History of Present Illness HPI: [name] is a 69 y.o. female with Past medical history of hypertension, alcohol abuse, B-12 deficiency, hypothyroidism, COPD. Admitted with confusion    PT Comments    Patient progressing today able to ambulate to hallway, but fatigued with attempts to work on standing balance and chair provided to prevent fall in hallway.  Continue to recommend SNF level rehab at d/c.  Follow Up Recommendations  SNF     Equipment Recommendations  Rolling walker with 5" wheels    Recommendations for Other Services       Precautions / Restrictions Precautions Precautions: Fall    Mobility  Bed Mobility Overal bed mobility: Needs Assistance       Supine to sit: Mod assist     General bed mobility comments: assist to initiate and complete task due to highly distractible despite multple cues and increased time  Transfers Overall transfer level: Needs assistance Equipment used: 2 person hand held assist;Rolling walker (2 wheeled) Transfers: Sit to/from Stand Sit to Stand: Mod assist;+2 safety/equipment         General transfer comment: Paient used walker to assist to stand, then unable to sequence and come forward for ambulation so switched to bilateral HHA  Ambulation/Gait Ambulation/Gait assistance: Mod assist Ambulation Distance (Feet): 20 Feet Assistive device: 2 person hand held assist Gait Pattern/deviations: Step-to pattern;Narrow base of support;Festinating;Leaning posteriorly     General Gait Details: posterior throughout despite cues and techniques to bring COG over [facility]   Stairs            Wheelchair Mobility    Modified Rankin (Stroke Patients Only)       Balance Overall balance assessment: Needs assistance       Postural control: Posterior lean   Standing balance-Leahy Scale:  Poor Standing balance comment: min/mod assist for balance instanding holding rail in front.               High Level Balance Comments: work to come forward with COG over BOS and noted pt c/o stretching in legs and having more difficulty when fatigued    Cognition Arousal/Alertness: Awake/alert Behavior During Therapy: Flat affect Overall Cognitive Status: Impaired/Different from baseline Area of Impairment: Following commands;Attention;Problem solving   Current Attention Level: Focused Memory: Decreased short-term memory Following Commands: Follows one step commands inconsistently;Follows one step commands with increased time     Problem Solving: Decreased initiation;Slow processing;Requires verbal cues      Exercises      General Comments General comments (skin integrity, edema, etc.): daughter in room throughout session; patient stated she was her grandaugthtter.        Pertinent Vitals/Pain Pain Assessment: No/denies pain    Home Living                      Prior Function            PT Goals (current goals can now be found in the care plan section) Progress towards PT goals: Progressing toward goals    Frequency  Min 3X/week    PT Plan Current plan remains appropriate    Co-evaluation             End of Session Equipment Utilized During Treatment: Other (comment) Activity Tolerance: Patient tolerated treatment well Patient left: with call bell/phone within reach;with family/visitor present;with chair alarm set  Time: 1133-1202 PT Time Calculation (min) (ACUTE ONLY): 29 min  Charges:  $Gait Training: 8-22 mins $Therapeutic Activity: 8-22 mins                    G Codes:      Shenna Brissette,CYNDI May 03, 2015, 2:50 PM  Echo, [place] May 03, 2015

## 2015-04-28 NOTE — Progress Notes (Signed)
[facility]                                                                              Patient Demographics  [name], is a 69 y.o. female, DOB - 1946-05-30, LSL:[id number]  Admit date - 04/22/2015   Admitting Physician Lavina Hamman, MD  Outpatient Primary MD for the patient is Redge Gainer, MD  LOS - 5   Chief Complaint  Patient presents with  . Altered Mental Status       Brief HPI  Patient is a 69 year old female with hypertension, alcohol abuse, B12 deficiency, hypothyroidism, COPD presented with confusion. Patient was unable to provide any history due to her mental status. Patient's son at the bedside had mentioned that patient was having intermittent confusion which was progressively worsening in the last several days. Patient also drank 7-9 shots of vodka on daily basis, last drink on 5/11. Patient was admitted for further workup of acute encephalopathy.  Patient was initially admitted to the stepdown unit for closer monitoring because of waxing and waning mentation, hypertensive encephalopathy, alcohol withdrawal delirium. On admission patient had slurred speech and CT head showed severe microvascular changes but no acute CVA. UA was positive, patient was started on Rocephin, received 2 doses.   Assessment & Plan    Principal Problem:   Acute encephalopathy: Improving somewhat, oriented 2 - Patient's daughter reported that her mental status has improved some however still continues to have waxing and waning mental status, had a sitter in the room - will repeat UA and urine culture, patient's family requested for MRI to be obtained to rule out acute CVA/PRES - Ammonia level was 45, was placed on lactulose  - PT/OT eval and increase mobility - Appreciate psychiatry recommendations, patient's family declined Seroquel  Active Problems:   Hypothyroidism - TSH very low 0.074, T4 3.5, noted that Synthroid has been decreased on 5/17 (125->66mcg)   Hypertensive emergency -stable now, but elevated in the morning, increased Imdur to 60 mg daily,  continue avapro, metoprolol, clonidine    Alcohol withdrawal delirium -  CIWA 4, continue CIWA protocol with Ativan    UTI (urinary tract infection) - Obtain UA and culture, if patient has UTI, will restart Rocephin. Unfortunately urine culture was not obtained at the time of admission   Code Status:  FC  Family Communication: Discussed in detail with the patient, all imaging results, lab results explained to the patient's daughter   Disposition Plan: SNF when ready  Time Spent in minutes  30 minutes  Procedures  CT head  Consults   psych  DVT Prophylaxis   heparin  Medications  Scheduled Meds: . allopurinol  300 mg Oral Daily  . aspirin  325 mg Oral Daily  . budesonide-formoterol  2 puff Inhalation BID  . cloNIDine  0.1 mg Transdermal Q Thu  . DULoxetine  60 mg Oral Daily  . feeding supplement (ENSURE ENLIVE)  237 mL Oral BID BM  . heparin  5,000 Units Subcutaneous 3 times per day  . irbesartan  150 mg Oral Daily  . isosorbide mononitrate  30 mg Oral Daily  . levothyroxine  75  mcg Oral QAC breakfast  . metoprolol  100 mg Oral BID  . potassium chloride  40 mEq Oral BID  . sodium chloride  3 mL Intravenous Q12H  . thiamine  100 mg Intravenous Daily   Continuous Infusions:  PRN Meds:.hydrALAZINE, labetalol, LORazepam, ondansetron **OR** ondansetron (ZOFRAN) IV   Antibiotics   Anti-infectives    Start     Dose/Rate Route Frequency Ordered Stop   04/23/15 2200  cefTRIAXone (ROCEPHIN) 1 g in dextrose 5 % 50 mL IVPB  Status:  Discontinued     1 g 100 mL/hr over 30 Minutes Intravenous Daily at bedtime 04/23/15 0502 04/27/15 0958   04/23/15 0045  cefTRIAXone (ROCEPHIN) 1 g in dextrose 5 % 50 mL IVPB     1 g 100 mL/hr over 30 Minutes Intravenous  Once 04/23/15 0039 04/23/15 0205        Subjective:   [name] was seen and examined today.  Patient seen and  examined, alert and oriented 2, sitter in the room, daughter at the bedside, denies any specific complaints. Per daughter, pt still has intermittent confusion.  Patient denies dizziness, chest pain, shortness of breath, abdominal pain, N/V, new weakness. No acute events overnight.    Objective:   Blood pressure 133/73, pulse 71, temperature 98.5 F (36.9 C), temperature source Axillary, resp. rate 18, height 5\' 10"  (1.778 m), weight 71.169 kg (156 lb 14.4 oz), last menstrual period 08/21/2013, SpO2 99 %.  Wt Readings from Last 3 Encounters:  04/28/15 71.169 kg (156 lb 14.4 oz)  12/16/14 74.844 kg (165 lb)  11/12/14 77.293 kg (170 lb 6.4 oz)     Intake/Output Summary (Last 24 hours) at 04/28/15 1422 Last data filed at 04/28/15 1353  Gross per 24 hour  Intake   1219 ml  Output      0 ml  Net   1219 ml    Exam  General: Alert and oriented x2, NAD  HEENT:  PERRLA, EOMI, Anicteic Sclera, mucous membranes moist.   Neck: Supple, no JVD, no masses  CVS: S1 S2 auscultated, no rubs, murmurs or gallops. Regular rate and rhythm.  Respiratory: Clear to auscultation bilaterally, no wheezing, rales or rhonchi  Abdomen: Soft, nontender, nondistended, + bowel sounds  Ext: no cyanosis clubbing or edema  Neuro: Cr N's II- XII. Strength 5/5 upper and lower extremities bilaterally  Skin: No rashes  Psych: Normal affect and demeanor, alert and oriented x2   Data Review   Micro Results Recent Results (from the past 240 hour(s))  Gram stain     Status: None   Collection Time: 04/22/15 11:39 PM  Result Value Ref Range Status   Specimen Description URINE, CATHETERIZED  Final   Special Requests NONE  Final   Gram Stain   Final    CYTOSPIN SLIDE WBC PRESENT, PREDOMINANTLY PMN Multiple bacterial morphotypes present, none predominant. Suggest appropriate recollection if clinically indicated. RESULT CALLED TO, READ BACK BY AND VERIFIED WITH: NOTIFIED J WATTS,RN 04/23/15 0030 BY RHOLMES      Report Status 04/23/2015 FINAL  Final  MRSA PCR Screening     Status: None   Collection Time: 04/23/15  6:47 AM  Result Value Ref Range Status   MRSA by PCR NEGATIVE NEGATIVE Final    Comment:        The GeneXpert MRSA Assay (FDA approved for NASAL specimens only), is one component of a comprehensive MRSA colonization surveillance program. It is not intended to diagnose MRSA infection nor to guide or  monitor treatment for MRSA infections.     Radiology Reports Ct Head Wo Contrast  04/22/2015   CLINICAL DATA:  Altered mental status.  Slurred speech, resolved.  EXAM: CT HEAD WITHOUT CONTRAST  TECHNIQUE: Contiguous axial images were obtained from the base of the skull through the vertex without intravenous contrast.  COMPARISON:  10/21/2013, 10/02/2012  FINDINGS: There is no intracranial hemorrhage, mass or evidence of acute infarction. There is severe generalized atrophy. There is severe chronic microvascular ischemic change. There is no significant extra-axial fluid collection. Brainstem and posterior fossa appear unremarkable.  No acute intracranial findings are evident.  IMPRESSION: Severe atrophy and chronic microvascular changes. No acute intracranial findings.   Electronically Signed   By: Andreas Newport M.D.   On: 04/22/2015 22:20    CBC  Recent Labs Lab 04/22/15 2048 04/23/15 0529 04/24/15 0235 04/27/15 0820 04/28/15 0451  WBC 8.5 8.9 5.6 4.2 4.2  HGB 11.5* 11.5* 10.8* 9.1* 9.2*  HCT 36.6 36.2 34.9* 29.5* 29.6*  PLT 318 325 268 193 171  MCV 79.9 79.6 81.5 80.6 81.5  MCH 25.1* 25.3* 25.2* 24.9* 25.3*  MCHC 31.4 31.8 30.9 30.8 31.1  RDW 15.0 14.9 15.2 15.6* 15.8*  LYMPHSABS  --  1.5  --  1.1 1.4  MONOABS  --  0.8  --  0.4 0.5  EOSABS  --  0.0  --  0.1 0.1  BASOSABS  --  0.0  --  0.0 0.0    Chemistries   Recent Labs Lab 04/22/15 2048 04/23/15 0529 04/24/15 0235 04/27/15 0820 04/28/15 0451  NA 134* 135 139 138 137  K 3.3* 3.8 3.4* 2.8* 3.5  CL 98* 99*  105 107 108  CO2 22 22 23 22  21*  GLUCOSE 128* 143* 103* 93 85  BUN 11 8 7  <5* 8  CREATININE 0.96 0.87 0.81 0.79 0.83  CALCIUM 9.5 9.4 8.9 8.7* 9.0  AST 15 25  --   --  18  ALT 11* 12*  --   --  12*  ALKPHOS 123 125  --   --  81  BILITOT 1.1 1.6*  --   --  0.9   ------------------------------------------------------------------------------------------------------------------ estimated creatinine clearance is 71.1 mL/min (by C-G formula based on Cr of 0.83). ------------------------------------------------------------------------------------------------------------------ No results for input(s): HGBA1C in the last 72 hours. ------------------------------------------------------------------------------------------------------------------ No results for input(s): CHOL, HDL, LDLCALC, TRIG, CHOLHDL, LDLDIRECT in the last 72 hours. ------------------------------------------------------------------------------------------------------------------ No results for input(s): TSH, T4TOTAL, T3FREE, THYROIDAB in the last 72 hours.  Invalid input(s): FREET3 ------------------------------------------------------------------------------------------------------------------ No results for input(s): VITAMINB12, FOLATE, FERRITIN, TIBC, IRON, RETICCTPCT in the last 72 hours.  Coagulation profile  Recent Labs Lab 04/23/15 0529 04/28/15 0451  INR 1.04 1.07    No results for input(s): DDIMER in the last 72 hours.  Cardiac Enzymes  Recent Labs Lab 04/23/15 0529  TROPONINI 0.03   ------------------------------------------------------------------------------------------------------------------ Invalid input(s): POCBNP  No results for input(s): GLUCAP in the last 72 hours.   RAI,RIPUDEEP M.D. [facility] 04/28/2015, 2:22 PM  Pager: [phone]   Between 7am to 7pm - call Pager - [phone]  After 7pm go to [email or web address] - password [email or web address]  Call night coverage person covering after  7pm

## 2015-04-28 NOTE — Progress Notes (Signed)
Nutrition Follow-up  DOCUMENTATION CODES:  Not applicable  INTERVENTION:  Ensure Enlive (each supplement provides 350kcal and 20 grams of protein)  NUTRITION DIAGNOSIS:  Inadequate oral intake related to lethargy/confusion as evidenced by meal completion < 50%.  Ongoing  GOAL:  Patient will meet greater than or equal to 90% of their needs  Progressing  MONITOR:  PO intake, Supplement acceptance, Labs, Weight trends, Skin, I & O's  REASON FOR ASSESSMENT:  Malnutrition Screening Tool    ASSESSMENT: 69 y.o. Female with PMH of TIA, HTN, depression, CKD, ethanol abuse; admitted with waxing and waning mentation/confusion/toxic metabolic encephalopathy.  Attempted to examine pt x2, however, pt was in with nursing and working with therapy at time of visit.   Staff reports that pt's mental status has improved. Meal intake remains poor, but improving (PO: 10-40%). RD added Ensure Enlive supplement on 04/26/15, when pt diet was advanced. Pt is accepting supplements- RD will continue supplement order to promote nutritional adequacy.  Psychiatry following; pt does not meet criteria for psychiatric admission. CSW reports pt has a bed at [facility] when medically stable.    Height:  Ht Readings from Last 1 Encounters:  04/22/15 5\' 10"  (1.778 m)    Weight:  Wt Readings from Last 1 Encounters:  04/28/15 156 lb 14.4 oz (71.169 kg)    Ideal Body Weight:  68 kg  Wt Readings from Last 10 Encounters:  04/28/15 156 lb 14.4 oz (71.169 kg)  12/16/14 165 lb (74.844 kg)  11/12/14 170 lb 6.4 oz (77.293 kg)  09/11/14 180 lb (81.647 kg)  02/19/14 190 lb 9.6 oz (86.456 kg)  01/09/14 185 lb (83.915 kg)  01/06/14 185 lb (83.915 kg)  12/18/13 187 lb (84.823 kg)  11/17/13 185 lb 9.6 oz (84.188 kg)  11/03/13 182 lb 6.4 oz (82.736 kg)    BMI:  Body mass index is 22.51 kg/(m^2).  Estimated Nutritional Needs:  Kcal:  1700-1900  Protein:  80-90 gm  Fluid:  1.7-1.9 L  Skin:   Reviewed, no issues  Diet Order:  Diet Heart Room service appropriate?: Yes; Fluid consistency:: Thin  EDUCATION NEEDS:  Education needs no appropriate at this time   Intake/Output Summary (Last 24 hours) at 04/28/15 1555 Last data filed at 04/28/15 1353  Gross per 24 hour  Intake   1219 ml  Output      0 ml  Net   1219 ml    Last BM:  04/28/15  Bradan Congrove A. Jimmye Norman, RD, LDN, CDE Pager: [phone] After hours Pager: [phone]

## 2015-04-28 NOTE — Clinical Social Work Note (Signed)
Patient's family has chosen [facility] for placement. They would like to transport the patient to the facility themselves. Patient's PASRR number received this afternoon, [id number], valid through 05/28/2015.   Liz Beach MSW, [place], [place], [phone]

## 2015-04-29 DIAGNOSIS — E039 Hypothyroidism, unspecified: Secondary | ICD-10-CM

## 2015-04-29 LAB — URINE CULTURE
Colony Count: NO GROWTH
Culture: NO GROWTH

## 2015-04-29 MED ORDER — POTASSIUM CHLORIDE CRYS ER 20 MEQ PO TBCR
20.0000 meq | EXTENDED_RELEASE_TABLET | Freq: Every day | ORAL | Status: DC
Start: 2015-04-29 — End: 2015-07-12

## 2015-04-29 MED ORDER — LEVOTHYROXINE SODIUM 75 MCG PO TABS: 75.0000 ug | ORAL_TABLET | Freq: Every day | ORAL | Status: DC

## 2015-04-29 MED ORDER — ENSURE ENLIVE PO LIQD: 237.0000 mL | Freq: Two times a day (BID) | ORAL | Status: DC

## 2015-04-29 MED ORDER — INDOMETHACIN ER 75 MG PO CPCR: 75.0000 mg | ORAL_CAPSULE | Freq: Every day | ORAL | Status: DC | PRN

## 2015-04-29 MED ORDER — METOPROLOL TARTRATE 100 MG PO TABS: 100.0000 mg | ORAL_TABLET | Freq: Two times a day (BID) | ORAL | Status: DC

## 2015-04-29 MED ORDER — ISOSORBIDE MONONITRATE ER 60 MG PO TB24: 60.0000 mg | ORAL_TABLET | Freq: Every day | ORAL | Status: DC

## 2015-04-29 MED ORDER — CLONIDINE HCL 0.1 MG/24HR TD PTWK: 0.1000 mg | MEDICATED_PATCH | TRANSDERMAL | Status: DC

## 2015-04-29 MED ORDER — DULOXETINE HCL 60 MG PO CPEP: 60.0000 mg | ORAL_CAPSULE | Freq: Every day | ORAL | Status: DC

## 2015-04-29 NOTE — Progress Notes (Signed)
Patient was discharged to nursing home [facility]) by MD order; discharged instructions  review and sent to facility with the patient's family with  care notes; report was given to the nurse who is going to receive the patient Sharyn Lull); IV DIC; skin intact; patient will be transported to facility by her family.

## 2015-04-29 NOTE — Progress Notes (Signed)
Utilization Review completed. Merikay Lesniewski RN BSN CM 

## 2015-04-29 NOTE — Discharge Summary (Signed)
Physician Discharge Summary   Patient ID: [name] MRN: [id number] DOB/AGE: 05/30/1946 69 y.o.  Admit date: 04/22/2015 Discharge date: 04/29/2015  Primary Care Physician:  Redge Gainer, MD  Discharge Diagnoses:     . Acute encephalopathy . Hypertensive emergency . Alcohol withdrawal delirium . Hypothyroidism . UTI (urinary tract infection)  Consults: Psychiatry   Recommendations for Outpatient Follow-up:  Please note TSH was low 0.074, T4 3 0.5, Synthroid was decreased to 75 MCG  Please follow TSH, free T4 in 4 weeks  TESTS THAT NEED FOLLOW-UP CBC, BMET next week   DIET: Heart healthy diet  Allergies:   Allergies  Allergen Reactions  . Norvasc [Amlodipine Besylate] Hives  . Procardia [Nifedipine] Swelling    Edema       Discharge Medications:   Medication List    STOP taking these medications        azithromycin 250 MG tablet  Commonly known as:  ZITHROMAX     furosemide 40 MG tablet  Commonly known as:  LASIX      TAKE these medications        acetaminophen 500 MG tablet  Commonly known as:  TYLENOL  Take 500 mg by mouth every 6 (six) hours as needed for moderate pain. Pain     allopurinol 300 MG tablet  Commonly known as:  ZYLOPRIM  Take 1 tablet (300 mg total) by mouth daily.     aspirin 325 MG tablet  Take 1 tablet (325 mg total) by mouth daily.     budesonide-formoterol 160-4.5 MCG/ACT inhaler  Commonly known as:  SYMBICORT  Inhale 2 puffs into the lungs 2 (two) times daily.     cloNIDine 0.1 mg/24hr patch  Commonly known as:  CATAPRES - Dosed in mg/24 hr  Place 1 patch (0.1 mg total) onto the skin every Thursday.     DULoxetine 60 MG capsule  Commonly known as:  CYMBALTA  Take 1 capsule (60 mg total) by mouth daily.     feeding supplement (ENSURE ENLIVE) Liqd  Take 237 mLs by mouth 2 (two) times daily between meals.     folic acid 1 MG tablet  Commonly known as:  FOLVITE  Take 1 tablet (1 mg total) by mouth daily.      indomethacin 75 MG CR capsule  Commonly known as:  INDOCIN SR  Take 1 capsule (75 mg total) by mouth daily as needed (for gout).     isosorbide mononitrate 60 MG 24 hr tablet  Commonly known as:  IMDUR  Take 1 tablet (60 mg total) by mouth daily.     levothyroxine 75 MCG tablet  Commonly known as:  SYNTHROID, LEVOTHROID  Take 1 tablet (75 mcg total) by mouth daily before breakfast.     medroxyPROGESTERone 2.5 MG tablet  Commonly known as:  PROVERA  Take 2.5 mg by mouth daily.     metoprolol 100 MG tablet  Commonly known as:  LOPRESSOR  Take 1 tablet (100 mg total) by mouth 2 (two) times daily.     potassium chloride SA 20 MEQ tablet  Commonly known as:  K-DUR,KLOR-CON  Take 1 tablet (20 mEq total) by mouth daily.     PREMARIN 1.25 MG tablet  Generic drug:  estrogens (conjugated)  Take 1.25 mg by mouth daily.     thiamine 100 MG tablet  Commonly known as:  VITAMIN B-1  Take 1 tablet (100 mg total) by mouth daily.     valsartan 320 MG tablet  Commonly known as:  DIOVAN  Take 1 tablet (320 mg total) by mouth daily.         Brief H and P: For complete details please refer to admission H and P, but in brief Patient is a 69 year old female with hypertension, alcohol abuse, B12 deficiency, hypothyroidism, COPD presented with confusion. Patient was unable to provide any history due to her mental status. Patient's son at the bedside had mentioned that patient was having intermittent confusion which was progressively worsening in the last several days. Patient also drank 7-9 shots of vodka on daily basis, last drink on 5/11. Patient was admitted for further workup of acute encephalopathy.   Hospital Course:  Acute encephalopathy: Significantly improved, alert and oriented, thought to be from toxic metabolic encephalopathy, alcohol withdrawal delirium versus UTI Patient has been seen by neurology outpatient and was referred for neuropsychological testing to determine whether  there was some psychogenic versus organic pathology. Patient was initially admitted to the stepdown unit for closer monitoring because of waxing and waning mentation, hypertensive encephalopathy, alcohol withdrawal delirium. On admission patient had slurred speech and CT head showed severe microvascular changes but no acute CVA. UA was positive, patient was started on Rocephin, received 2 doses. Patient's mental status has improved at the time of discharge. Repeat urine test was done which did not show UTI. MRI of the brain was performed which did not show any acute CVA. At the time of admission ammonia level was 45, patient was briefly placed on lactulose. Psychiatry was consulted and patient was seen by Dr Octavio Graves, who recommended Seroquel for agitation. However patient's family declined Seroquel and hence it was discontinued. PT OT evaluation was done and recommended skilled nursing facility to continue rehabilitation.    Hypothyroidism - TSH very low 0.074, T4 3.5, noted that Synthroid has been decreased on 5/17 (125->42mcg)   Hypertensive emergency -stable now, continue Imdur, avapro, metoprolol, clonidine please note that Lasix was placed on hold, as patient had ketones on the UA and dehydration. Please evaluate to see if Lasix can be restarted outpatient   Alcohol withdrawal delirium Significantly improved, patient is alert and oriented, confirmed by the daughter at the bedside.   UTI (urinary tract infection) -  repeat UA showed no UTI, patient had received 2 doses of IV Rocephin during hospitalization.  Unfortunately urine culture was not obtained at the time of admission    Day of Discharge BP 143/66 mmHg  Pulse 80  Temp(Src) 98.3 F (36.8 C) (Oral)  Resp 20  Ht 5\' 10"  (1.778 m)  Wt 72 kg (158 lb 11.7 oz)  BMI 22.78 kg/m2  SpO2 97%  LMP 08/21/2013  Physical Exam: General: Alert and awake oriented x3 not in any acute distress. HEENT: anicteric sclera, pupils reactive  to light and accommodation CVS: S1-S2 clear no murmur rubs or gallops Chest: clear to auscultation bilaterally, no wheezing rales or rhonchi Abdomen: soft nontender, nondistended, normal bowel sounds Extremities: no cyanosis, clubbing or edema noted bilaterally Neuro: Cranial nerves II-XII intact, no focal neurological deficits   The results of significant diagnostics from this hospitalization (including imaging, microbiology, ancillary and laboratory) are listed below for reference.    LAB RESULTS: Basic Metabolic Panel:  Recent Labs Lab 04/27/15 0820 04/28/15 0451  NA 138 137  K 2.8* 3.5  CL 107 108  CO2 22 21*  GLUCOSE 93 85  BUN <5* 8  CREATININE 0.79 0.83  CALCIUM 8.7* 9.0   Liver Function Tests:  Recent Labs Lab 04/23/15  6789 04/28/15 0451  AST 25 18  ALT 12* 12*  ALKPHOS 125 81  BILITOT 1.6* 0.9  PROT 7.8 6.0*  ALBUMIN 3.9 3.2*   No results for input(s): LIPASE, AMYLASE in the last 168 hours.  Recent Labs Lab 04/23/15 0529 04/27/15 1240  AMMONIA 45* 9   CBC:  Recent Labs Lab 04/27/15 0820 04/28/15 0451  WBC 4.2 4.2  NEUTROABS 2.6 2.2  HGB 9.1* 9.2*  HCT 29.5* 29.6*  MCV 80.6 81.5  PLT 193 171   Cardiac Enzymes:  Recent Labs Lab 04/23/15 0529  TROPONINI 0.03   BNP: Invalid input(s): POCBNP CBG:  Recent Labs Lab 04/22/15 2102  GLUCAP 119*    Significant Diagnostic Studies:  Ct Head Wo Contrast  04/22/2015   CLINICAL DATA:  Altered mental status.  Slurred speech, resolved.  EXAM: CT HEAD WITHOUT CONTRAST  TECHNIQUE: Contiguous axial images were obtained from the base of the skull through the vertex without intravenous contrast.  COMPARISON:  10/21/2013, 10/02/2012  FINDINGS: There is no intracranial hemorrhage, mass or evidence of acute infarction. There is severe generalized atrophy. There is severe chronic microvascular ischemic change. There is no significant extra-axial fluid collection. Brainstem and posterior fossa appear  unremarkable.  No acute intracranial findings are evident.  IMPRESSION: Severe atrophy and chronic microvascular changes. No acute intracranial findings.   Electronically Signed   By: Andreas Newport M.D.   On: 04/22/2015 22:20    2D ECHO:   Disposition and Follow-up: Discharge Instructions    Diet - low sodium heart healthy    Complete by:  As directed      Increase activity slowly    Complete by:  As directed             [facility] Follow-up Information    Follow up with Redge Gainer, MD On 05/11/2015.   Specialty:  Family Medicine   Why:  for hospital follow-up/ Appoitment with Dr. Laurance Flatten is on 05/11/15 at [facility] information:   [place] [id number] [phone]        Time spent on Discharge: 35 minutes  Signed:   Lawana Hartzell M.D. [facility] 04/29/2015, 11:31 AM Pager: [phone]

## 2015-04-29 NOTE — Consult Note (Signed)
Psychiatry Consult follow up  Reason for Consult:  Capacity evaluation, AMS vs Alcohol withdrawal delirium Referring Physician:  Dr. 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 UTI (urinary tract infection) [N39.0] 04/28/2015  . Hypertensive urgency [I10]   . Altered mental status [R41.82] 04/23/2015  . Thyrotoxic crisis [E05.91] 04/23/2015  . Acute encephalopathy [G93.40] 04/23/2015  . Hypertensive emergency [I10] 04/23/2015  . Alcohol withdrawal delirium [F10.231] 04/23/2015  . Alcohol abuse [F10.10] 10/21/2013  . 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 Carotid artery narrowing [I65.29] 10/03/2012  . Orthostatic syncope [I95.1] 10/02/2012  . Hypokalemia [E87.6] 10/02/2012  . Hypothyroidism [E03.9] 10/02/2012  . Acute renal failure [N17.9] 01/09/2012  . HTN (hypertension) [I10] 01/09/2012  . Hyperlipidemia [E78.5] 01/09/2012  . TIA (transient ischemic attack) [G45.9] 01/09/2012  . Hypovolemia dehydration [E86.1] 01/09/2012  . Metabolic acidosis [I95.1] 01/09/2012  . Hyponatremia [E87.1] 01/09/2012  . Anemia [D64.9] 01/09/2012  . Alcoholism [F10.20] 01/09/2012  . B12 deficiency [E53.8] 01/09/2012    Total Time spent with patient: 30 minutes  Subjective:   [name] is a 69 y.o. female patient admitted with AMS.  HPI: [name] is a 69 years old female admitted to [facility] with altered mental status, waxing and waning mental status and poor compliance with her medication management. Patient appeared sitting in a chair and her food tray was in front of her but not trying to eat. Patient appeared staring at the TV and has significant psychomotor retardation, delayed verbal responses and sometimes not able to respond or complete sentence. Patient is a poor historian and  information obtained from the medical records, face-to-face evaluation and discussion with the Dr. Verlon Au about her ongoing alcohol abuse versus dependence, lost job, unable to care for herself and current capacity to to make medical decisions and living arrangements. Reportedly patient has been drinking vodka  about 7-9 shots daily morning but minimizes saying that she drinks only 1 shot a day. Patient son worried about mother and won't  be able to stop drinking herself without help. Patient has poor cognitive functions including orientation, concentration, immediate and delayed memory and language functions. Patient denies symptoms of mania, psychosis, suicidal and homicidal ideation, intention or plans. Spoke with patient son who is concern about her safety and in agreement with treatment and plan of caring her out of home if possible. Reportedly she has been out of work from November 2014 due to TIA.  Past psychiatric history: Patient denies history of alcohol detox treatment and rehabilitation treatment and inpatient psychiatric hospitalization. Social history: Patient reportedly worked for [employer] in [place], [place] lost her job because she was not able to function. Patient son concerned about her uncontrollable drinking problem and unable to care for herself. No family history of mental illness  Interval history: Patient seen today for the psych consultation follow up. Patient appeared lying down on her bed and her daughter was at bedside. Patient is more awake, alert to herself and her daughter. Patient continued to have significant cognitive deficits especially orientation, concentration, immediate and delayed memory. Patient stated she has been happy to have her family support and happy that she will be discharged to skilled nursing facility. Case manager informed her that she was accepted. Patient has limited insight into her current mental status. Patient's verbally responding and making complete  sentences  but mostly vague and empty sentences. She is having hard time to give the facts about herself and her family members. Patient was not able to describe her day from this morning. Today she was able to recall her daughter's name which is a significant improvement from yesterday. She has quick verbal responses then yesterday. Patient received the MRI scan of the brain and patient daughter reported she has struggling to cooperate because of claustrophobia and side effect of the medication given to her which is Ativan and Haldol. Patient was not able to talk about her day yesterday, some of the reason is medication and other reason is she continue poor poor historian since he was admitted to hospital.    Past Medical History:  Past Medical History  Diagnosis Date  . Hypertension   . Menopause   . Hypothyroid   . Mini stroke   . Hyperlipidemia   . B12 deficiency   . Shortness of breath   . Headache(784.0)   . Anxiety   . Pneumonia   . Anemia   . Depression   . Stroke 2010    no deficits  . Alcoholism 01/09/2012  . Hyponatremia 01/09/2012  . Chronic kidney disease (CKD), stage II (mild)   . COPD (chronic obstructive pulmonary disease)   . Orthostatic syncope 10/02/2012  . Carotid artery narrowing 10/03/2012    On the right.    Past Surgical History  Procedure Laterality Date  . Appendectomy    . Tonsillectomy    . Cataract extraction w/phaco  06/20/2012    Procedure: CATARACT EXTRACTION PHACO AND INTRAOCULAR LENS PLACEMENT (IOC);  Surgeon: Tonny Branch, MD;  Location: [facility];  Service: Ophthalmology;  Laterality: Right;  CDE:10.66  . Cataract extraction w/phaco  07/11/2012    Procedure: CATARACT EXTRACTION PHACO AND INTRAOCULAR LENS PLACEMENT (IOC);  Surgeon: Tonny Branch, MD;  Location: [facility];  Service: Ophthalmology;  Laterality: Left;  CDE: 12.69   Family History:  Family History  Problem Relation Age of Onset  . GER disease Mother   . Hypertension Maternal Grandmother     Social History:  History  Alcohol Use  . 7.0 oz/week  . 14 drink(s) per week     History  Drug Use No    History   Social History  . Marital Status: Widowed    Spouse Name: N/A  . Number of Children: N/A  . Years of Education: N/A   Social History Main Topics  . Smoking status: Former Smoker -- 2.00 packs/day for 30 years    Types: Cigarettes    Quit date: 06/17/1992  . Smokeless tobacco: Not on file  . Alcohol Use: 7.0 oz/week    14 drink(s) per week  . Drug Use: No  . Sexual Activity: Not on file   Other Topics Concern  . None   Social History Narrative   Additional Social History:                          Allergies:   Allergies  Allergen Reactions  . Norvasc [Amlodipine Besylate] Hives  . Procardia [Nifedipine] Swelling    Edema      Labs:  Results for orders placed or performed during the hospital encounter of 04/22/15 (from the past 48 hour(s))  Ammonia     Status: None   Collection Time: 04/27/15 12:40 PM  Result Value Ref Range   Ammonia 9 9 - 35 umol/L  CBC with Differential/Platelet  Status: Abnormal   Collection Time: 04/28/15  4:51 AM  Result Value Ref Range   WBC 4.2 4.0 - 10.5 K/uL   RBC 3.63 (L) 3.87 - 5.11 MIL/uL   Hemoglobin 9.2 (L) 12.0 - 15.0 g/dL   HCT 29.6 (L) 36.0 - 46.0 %   MCV 81.5 78.0 - 100.0 fL   MCH 25.3 (L) 26.0 - 34.0 pg   MCHC 31.1 30.0 - 36.0 g/dL   RDW 15.8 (H) 11.5 - 15.5 %   Platelets 171 150 - 400 K/uL   Neutrophils Relative % 53 43 - 77 %   Neutro Abs 2.2 1.7 - 7.7 K/uL   Lymphocytes Relative 33 12 - 46 %   Lymphs Abs 1.4 0.7 - 4.0 K/uL   Monocytes Relative 12 3 - 12 %   Monocytes Absolute 0.5 0.1 - 1.0 K/uL   Eosinophils Relative 2 0 - 5 %   Eosinophils Absolute 0.1 0.0 - 0.7 K/uL   Basophils Relative 0 0 - 1 %   Basophils Absolute 0.0 0.0 - 0.1 K/uL  Comprehensive metabolic panel     Status: Abnormal   Collection Time: 04/28/15  4:51 AM  Result Value Ref Range   Sodium 137 135 - 145  mmol/L   Potassium 3.5 3.5 - 5.1 mmol/L    Comment: DELTA CHECK NOTED   Chloride 108 101 - 111 mmol/L   CO2 21 (L) 22 - 32 mmol/L   Glucose, Bld 85 65 - 99 mg/dL   BUN 8 6 - 20 mg/dL   Creatinine, Ser 0.83 0.44 - 1.00 mg/dL   Calcium 9.0 8.9 - 10.3 mg/dL   Total Protein 6.0 (L) 6.5 - 8.1 g/dL   Albumin 3.2 (L) 3.5 - 5.0 g/dL   AST 18 15 - 41 U/L   ALT 12 (L) 14 - 54 U/L   Alkaline Phosphatase 81 38 - 126 U/L   Total Bilirubin 0.9 0.3 - 1.2 mg/dL   GFR calc non Af Amer >60 >60 mL/min   GFR calc Af Amer >60 >60 mL/min    Comment: (NOTE) The eGFR has been calculated using the CKD EPI equation. This calculation has not been validated in all clinical situations. eGFR's persistently <60 mL/min signify possible Chronic Kidney Disease.    Anion gap 8 5 - 15  Protime-INR     Status: None   Collection Time: 04/28/15  4:51 AM  Result Value Ref Range   Prothrombin Time 14.1 11.6 - 15.2 seconds   INR 1.07 0.00 - 1.49  Urinalysis, Routine w reflex microscopic     Status: Abnormal   Collection Time: 04/28/15  3:00 PM  Result Value Ref Range   Color, Urine AMBER (A) YELLOW    Comment: BIOCHEMICALS MAY BE AFFECTED BY COLOR   APPearance CLEAR CLEAR   Specific Gravity, Urine 1.019 1.005 - 1.030   pH 5.5 5.0 - 8.0   Glucose, UA NEGATIVE NEGATIVE mg/dL   Hgb urine dipstick NEGATIVE NEGATIVE   Bilirubin Urine SMALL (A) NEGATIVE   Ketones, ur 15 (A) NEGATIVE mg/dL   Protein, ur NEGATIVE NEGATIVE mg/dL   Urobilinogen, UA 1.0 0.0 - 1.0 mg/dL   Nitrite NEGATIVE NEGATIVE   Leukocytes, UA NEGATIVE NEGATIVE    Comment: MICROSCOPIC NOT DONE ON URINES WITH NEGATIVE PROTEIN, BLOOD, LEUKOCYTES, NITRITE, OR GLUCOSE <1000 mg/dL.    Vitals: Blood pressure 143/66, pulse 80, temperature 98.3 F (36.8 C), temperature source Oral, resp. rate 20, height $RemoveBe'5\' 10"'mRVSccrWy$  (1.778 m), weight 72 kg (  158 lb 11.7 oz), last menstrual period 08/21/2013, SpO2 97 %.  Risk to Self: Is patient at risk for suicide?: No Risk to  Others:   Prior Inpatient Therapy:   Prior Outpatient Therapy:    Current Facility-Administered Medications  Medication Dose Route Frequency Provider Last Rate Last Dose  . allopurinol (ZYLOPRIM) tablet 300 mg  300 mg Oral Daily Nita Sells, MD   300 mg at 04/28/15 1046  . aspirin tablet 325 mg  325 mg Oral Daily Nita Sells, MD   325 mg at 04/28/15 1046  . budesonide-formoterol (SYMBICORT) 160-4.5 MCG/ACT inhaler 2 puff  2 puff Inhalation BID Nita Sells, MD   2 puff at 04/29/15 0902  . cloNIDine (CATAPRES - Dosed in mg/24 hr) patch 0.1 mg  0.1 mg Transdermal Q Thu Lavina Hamman, MD   0.1 mg at 04/23/15 0528  . DULoxetine (CYMBALTA) DR capsule 60 mg  60 mg Oral Daily Nita Sells, MD   60 mg at 04/28/15 1046  . feeding supplement (ENSURE ENLIVE) (ENSURE ENLIVE) liquid 237 mL  237 mL Oral BID BM Jenifer A Williams, RD   237 mL at 04/28/15 1350  . heparin injection 5,000 Units  5,000 Units Subcutaneous 3 times per day Lavina Hamman, MD   5,000 Units at 04/29/15 0523  . hydrALAZINE (APRESOLINE) injection 20 mg  20 mg Intravenous Q4H PRN Nita Sells, MD   20 mg at 04/29/15 0609  . irbesartan (AVAPRO) tablet 150 mg  150 mg Oral Daily Nita Sells, MD   150 mg at 04/28/15 1046  . isosorbide mononitrate (IMDUR) 24 hr tablet 60 mg  60 mg Oral Daily Ripudeep K Rai, MD      . labetalol (NORMODYNE,TRANDATE) injection 10 mg  10 mg Intravenous Q2H PRN Nita Sells, MD   10 mg at 04/28/15 0549  . levothyroxine (SYNTHROID, LEVOTHROID) tablet 75 mcg  75 mcg Oral QAC breakfast Nita Sells, MD   75 mcg at 04/29/15 0742  . LORazepam (ATIVAN) injection 2-3 mg  2-3 mg Intravenous Q1H PRN Lavina Hamman, MD   2 mg at 04/26/15 0112  . metoprolol (LOPRESSOR) tablet 100 mg  100 mg Oral BID Nita Sells, MD   100 mg at 04/28/15 2121  . ondansetron (ZOFRAN) tablet 4 mg  4 mg Oral Q6H PRN Lavina Hamman, MD       Or  . ondansetron [facility]) injection 4  mg  4 mg Intravenous Q6H PRN Lavina Hamman, MD      . potassium chloride SA (K-DUR,KLOR-CON) CR tablet 40 mEq  40 mEq Oral Daily Ripudeep K Rai, MD      . sodium chloride 0.9 % injection 3 mL  3 mL Intravenous Q12H Lavina Hamman, MD   3 mL at 04/28/15 2121  . thiamine (B-1) injection 100 mg  100 mg Intravenous Daily Lavina Hamman, MD   100 mg at 04/28/15 1046    Musculoskeletal: Strength & Muscle Tone: decreased Gait & Station: unable to stand Patient leans: N/A  Psychiatric Specialty Exam: Physical Exam   ROS   Blood pressure 143/66, pulse 80, temperature 98.3 F (36.8 C), temperature source Oral, resp. rate 20, height 5' 10"  (1.778 m), weight 72 kg (158 lb 11.7 oz), last menstrual period 08/21/2013, SpO2 97 %.Body mass index is 22.78 kg/(m^2).  General Appearance: Disheveled  Eye Sport and exercise psychologist::  Fair  Speech:  Clear and Coherent and Slow  Volume:  Decreased  Mood:  Depressed  Affect:  Blunt  and Depressed  Thought Process:  Coherent, Irrelevant and Loose  Orientation:  Other:  Oriented to place person but not to the time and situation  Thought Content:  Rumination  Suicidal Thoughts:  No  Homicidal Thoughts:  No  Memory:  Immediate;   Poor Recent;   Poor  Judgement:  Impaired  Insight:  Lacking  Psychomotor Activity:  Psychomotor Retardation  Concentration:  Poor  Recall:  [place] of Knowledge:Fair  Language: Fair  Akathisia:  Negative  Handed:  Right  AIMS (if indicated):     Assets:  Desire for Improvement Financial Resources/Insurance Leisure Time Physical Health Resilience Social Support  ADL's:  Impaired  Cognition: Impaired,  Moderate  Sleep:      Medical Decision Making: New problem, with additional work up planned, Review of Psycho-Social Stressors (1), Review or order clinical lab tests (1), Discuss test with performing physician (1), Established Problem, Worsening (2), Review or order medicine tests (1), Review of Medication Regimen & Side Effects (2) and  Review of New Medication or Change in Dosage (2)  Treatment Plan Summary: Patient has been suffering with significant cognitive deficits especially immediate and delayed memory and concentration. Patient has a limited insight and judgment. Patient seems to have alcohol withdrawal delirium versus vascular dementia. She is also diagnosed with depression and hypothyroidism.  Daily contact with patient to assess and evaluate symptoms and progress in treatment and Medication management  Plan: Capacity evaluation: Patient does not meet criteria for capacity make her own medical decisions or living arrangements. Refer to the psychiatric social service regarding possible medical care power of attorney and case management regarding appropriate out-of-home placement.  Alcohol withdrawal delirium: supportive therapy including thiamine  Patient has no signs of alcohol withdrawal at this time   Depression: Continue Cymbalta 60 mg daily  Hypothyroidism: Levothyroxine 75 mcg and monitoring of the TSH and free T3  Patient does not meet criteria for psychiatric inpatient admission. Supportive therapy provided about ongoing stressors.   Appreciate psychiatric consultation and follow up as clinically required Please contact [phone] or [phone] if needs further assistance  Disposition: Patient benefit from out-of-home placement at a skilled nursing facility when medically stable and improved mental status  [name],JANARDHAHA R. 04/29/2015 9:48 AM

## 2015-04-29 NOTE — Clinical Social Work Placement (Signed)
   CLINICAL SOCIAL WORK PLACEMENT  NOTE  Date:  04/29/2015  Patient Details  Name: [name] MRN: [id number] Date of Birth: [birth year]  Clinical Social Work is seeking post-discharge placement for this patient at the [place] level of care (*CSW will initial, date and re-position this form in  chart as items are completed):  Yes   Patient/family provided with [facility] Work Department's list of facilities offering this level of care within the geographic area requested by the patient (or if unable, by the patient's family).  Yes   Patient/family informed of their freedom to choose among providers that offer the needed level of care, that participate in Medicare, Medicaid or managed care program needed by the patient, have an available bed and are willing to accept the patient.  Yes   Patient/family informed of [facility]'s ownership interest in [facility] and [facility], as well as of the fact that they are under no obligation to receive care at these facilities.  PASRR submitted to EDS on 04/27/15     PASRR number received on       Existing PASRR number confirmed on 04/28/15     FL2 transmitted to all facilities in geographic area requested by pt/family on 04/27/15     FL2 transmitted to all facilities within larger geographic area on 04/28/15     Patient informed that his/her managed care company has contracts with or will negotiate with certain facilities, including the following:        Yes   Patient/family informed of bed offers received.  Patient [facility] bed [facility]     Physician recommends and patient chooses bed at      Patient to be transferred to [facility] on 04/29/15.  Patient to be transferred to facility by Patient's family     Patient family notified on 04/29/15 of transfer.  Name of family member notified:  Saralyn Pilar and [place]     PHYSICIAN       Additional Comment:  Per MD patient ready for  DC to [facility]. RN, patient, patient's family, and facility notified of DC. RN given number for report. DC packet on chart. Ambulance transport requested for patient. CSW signing off.  _______________________________________________ Rigoberto Noel, LCSW 04/29/2015, 2:28 PM

## 2015-04-30 ENCOUNTER — Emergency Department (HOSPITAL_COMMUNITY): Payer: Medicare Other

## 2015-04-30 ENCOUNTER — Emergency Department (HOSPITAL_COMMUNITY)
Admission: EM | Admit: 2015-04-30 | Discharge: 2015-04-30 | Disposition: A | Discharge status: Home / Self Care | Payer: Medicare Other | Attending: Emergency Medicine | Admitting: Emergency Medicine

## 2015-04-30 ENCOUNTER — Encounter (HOSPITAL_COMMUNITY): Payer: Self-pay | Admitting: *Deleted

## 2015-04-30 DIAGNOSIS — S0990XA Unspecified injury of head, initial encounter: Secondary | ICD-10-CM | POA: Diagnosis not present

## 2015-04-30 DIAGNOSIS — Z743 Need for continuous supervision: Secondary | ICD-10-CM | POA: Diagnosis not present

## 2015-04-30 DIAGNOSIS — Z8742 Personal history of other diseases of the female genital tract: Secondary | ICD-10-CM | POA: Insufficient documentation

## 2015-04-30 DIAGNOSIS — Y998 Other external cause status: Secondary | ICD-10-CM | POA: Diagnosis not present

## 2015-04-30 DIAGNOSIS — E039 Hypothyroidism, unspecified: Secondary | ICD-10-CM | POA: Diagnosis not present

## 2015-04-30 DIAGNOSIS — Z7982 Long term (current) use of aspirin: Secondary | ICD-10-CM | POA: Insufficient documentation

## 2015-04-30 DIAGNOSIS — D649 Anemia, unspecified: Secondary | ICD-10-CM | POA: Diagnosis not present

## 2015-04-30 DIAGNOSIS — S0003XA Contusion of scalp, initial encounter: Secondary | ICD-10-CM | POA: Diagnosis not present

## 2015-04-30 DIAGNOSIS — I129 Hypertensive chronic kidney disease with stage 1 through stage 4 chronic kidney disease, or unspecified chronic kidney disease: Secondary | ICD-10-CM | POA: Diagnosis not present

## 2015-04-30 DIAGNOSIS — F329 Major depressive disorder, single episode, unspecified: Secondary | ICD-10-CM | POA: Diagnosis not present

## 2015-04-30 DIAGNOSIS — F419 Anxiety disorder, unspecified: Secondary | ICD-10-CM | POA: Diagnosis not present

## 2015-04-30 DIAGNOSIS — J129 Viral pneumonia, unspecified: Secondary | ICD-10-CM | POA: Diagnosis not present

## 2015-04-30 DIAGNOSIS — N182 Chronic kidney disease, stage 2 (mild): Secondary | ICD-10-CM | POA: Insufficient documentation

## 2015-04-30 DIAGNOSIS — Y9289 Other specified places as the place of occurrence of the external cause: Secondary | ICD-10-CM | POA: Insufficient documentation

## 2015-04-30 DIAGNOSIS — J449 Chronic obstructive pulmonary disease, unspecified: Secondary | ICD-10-CM | POA: Diagnosis not present

## 2015-04-30 DIAGNOSIS — Z8701 Personal history of pneumonia (recurrent): Secondary | ICD-10-CM | POA: Diagnosis not present

## 2015-04-30 DIAGNOSIS — R51 Headache: Secondary | ICD-10-CM | POA: Diagnosis not present

## 2015-04-30 DIAGNOSIS — W052XXA Fall from non-moving motorized mobility scooter, initial encounter: Secondary | ICD-10-CM | POA: Diagnosis not present

## 2015-04-30 DIAGNOSIS — Z87891 Personal history of nicotine dependence: Secondary | ICD-10-CM | POA: Insufficient documentation

## 2015-04-30 DIAGNOSIS — Z8673 Personal history of transient ischemic attack (TIA), and cerebral infarction without residual deficits: Secondary | ICD-10-CM | POA: Insufficient documentation

## 2015-04-30 DIAGNOSIS — Z79899 Other long term (current) drug therapy: Secondary | ICD-10-CM | POA: Diagnosis not present

## 2015-04-30 DIAGNOSIS — Y9389 Activity, other specified: Secondary | ICD-10-CM | POA: Diagnosis not present

## 2015-04-30 DIAGNOSIS — R279 Unspecified lack of coordination: Secondary | ICD-10-CM | POA: Diagnosis not present

## 2015-04-30 DIAGNOSIS — S199XXA Unspecified injury of neck, initial encounter: Secondary | ICD-10-CM | POA: Diagnosis not present

## 2015-04-30 DIAGNOSIS — W19XXXA Unspecified fall, initial encounter: Secondary | ICD-10-CM

## 2015-04-30 LAB — URINALYSIS, ROUTINE W REFLEX MICROSCOPIC
Bilirubin Urine: NEGATIVE
Glucose, UA: NEGATIVE mg/dL
Ketones, ur: NEGATIVE mg/dL
Leukocytes, UA: NEGATIVE
Nitrite: NEGATIVE
Protein, ur: NEGATIVE mg/dL
Specific Gravity, Urine: 1.015 (ref 1.005–1.030)
Urobilinogen, UA: 0.2 mg/dL (ref 0.0–1.0)
pH: 7 (ref 5.0–8.0)

## 2015-04-30 LAB — URINE MICROSCOPIC-ADD ON

## 2015-04-30 NOTE — Discharge Instructions (Signed)
The patient was seen today for a fall. She has a hematoma to her for head. 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 This is unchanged when compared to prior. Otherwise patient's workup is reassuring. She should follow-up with her primary physician as scheduled on May 31.  Hematoma A hematoma is a collection of blood under the skin, in an organ, in a body space, in a joint space, or in other tissue. The blood can clot to form a lump that you can see and feel. The lump is often firm and may sometimes become sore and tender. Most hematomas get better in a few days to weeks. However, some hematomas may be serious and require medical care. Hematomas can range in size from very small to very large. CAUSES  A hematoma can be caused by a blunt or penetrating injury. It can also be caused by spontaneous leakage from a blood vessel under the skin. Spontaneous leakage from a blood vessel is more likely to occur in older people, especially those taking blood thinners. Sometimes, a hematoma can develop after certain medical procedures. SIGNS AND SYMPTOMS   A firm lump on the body.  Possible pain and tenderness in the area.  Bruising.Blue, dark blue, purple-red, or yellowish skin may appear at the site of the hematoma if the hematoma is close to the surface of the skin. For hematomas in deeper tissues or body spaces, the signs and symptoms may be subtle. For example, an intra-abdominal hematoma may cause abdominal pain, weakness, fainting, and shortness of breath. An intracranial hematoma may cause a headache or symptoms such as weakness, trouble speaking, or a change in consciousness. DIAGNOSIS  A hematoma can usually be diagnosed based on your medical history and a physical exam. Imaging tests may be needed if your health care provider suspects a hematoma in deeper tissues or body spaces, such as the abdomen, head, or chest. These tests may  include ultrasonography or a CT scan.  TREATMENT  Hematomas usually go away on their own over time. Rarely does the blood need to be drained out of the body. Large hematomas or those that may affect vital organs will sometimes need surgical drainage or monitoring. HOME CARE INSTRUCTIONS   Apply ice to the injured area:   Put ice in a plastic bag.   Place a towel between your skin and the bag.   Leave the ice on for 20 minutes, 2-3 times a day for the first 1 to 2 days.   After the first 2 days, switch to using warm compresses on the hematoma.   Elevate the injured area to help decrease pain and swelling. Wrapping the area with an elastic bandage may also be helpful. Compression helps to reduce swelling and promotes shrinking of the hematoma. Make sure the bandage is not wrapped too tight.   If your hematoma is on a lower extremity and is painful, crutches may be helpful for a couple days.   Only take over-the-counter or prescription medicines as directed by your health care provider. SEEK IMMEDIATE MEDICAL CARE IF:   You have increasing pain, or your pain is not controlled with medicine.   You have a fever.   You have worsening swelling or discoloration.   Your skin over the hematoma breaks or starts bleeding.   Your hematoma is in your chest or abdomen and you have weakness, shortness of breath, or a change in consciousness.  Your hematoma is on your scalp (  caused by a fall or injury) and you have a worsening headache or a change in alertness or consciousness. MAKE SURE YOU:   Understand these instructions.  Will watch your condition.  Will get help right away if you are not doing well or get worse. Document Released: 07/11/2004 Document Revised: 07/30/2013 Document Reviewed: 05/07/2013 [facility] Patient Information 2015 [place], [place]. This information is not intended to replace advice given to you by your health care provider. Make sure you discuss any questions  you have with your health care provider.

## 2015-04-30 NOTE — ED Provider Notes (Signed)
CSN: [id number]     Arrival date & time 04/30/15  0251 History   First MD Initiated Contact with Patient 04/30/15 0253     Chief Complaint  Patient presents with  . Fall     (Consider location/radiation/quality/duration/timing/severity/associated sxs/prior Treatment) HPI  This is a 69 year old female with a history of hypertension, hypothyroidism, hyperlipidemia, B-12 deficiency, alcoholism, chronic kidney disease who presents following a fall. She presents from her living facility after being found beside her wheelchair. The fall was unwitnessed. She was noted to have a hematoma to her right for head. Patient was recently admitted and just discharged from the hospital yesterday following an episode of acute encephalopathy. She was discharged to [facility]. Patient is uncooperative with history taking. She is oriented 2 to self and place. When asking the patient questions she keeps saying "what he wanted that for?"  When asked if she fell out of her chair, she states "I guess so."  Denies any pain.  Level V caveat  Past Medical History  Diagnosis Date  . Hypertension   . Menopause   . Hypothyroid   . Mini stroke   . Hyperlipidemia   . B12 deficiency   . Shortness of breath   . Headache(784.0)   . Anxiety   . Pneumonia   . Anemia   . Depression   . Stroke 2010    no deficits  . Alcoholism 01/09/2012  . Hyponatremia 01/09/2012  . Chronic kidney disease (CKD), stage II (mild)   . COPD (chronic obstructive pulmonary disease)   . Orthostatic syncope 10/02/2012  . Carotid artery narrowing 10/03/2012    On the right.   Past Surgical History  Procedure Laterality Date  . Appendectomy    . Tonsillectomy    . Cataract extraction w/phaco  06/20/2012    Procedure: CATARACT EXTRACTION PHACO AND INTRAOCULAR LENS PLACEMENT (IOC);  Surgeon: Tonny Branch, MD;  Location: [facility];  Service: Ophthalmology;  Laterality: Right;  CDE:10.66  . Cataract extraction w/phaco  07/11/2012    Procedure:  CATARACT EXTRACTION PHACO AND INTRAOCULAR LENS PLACEMENT (IOC);  Surgeon: Tonny Branch, MD;  Location: [facility];  Service: Ophthalmology;  Laterality: Left;  CDE: 12.69   Family History  Problem Relation Age of Onset  . GER disease Mother   . Hypertension Maternal Grandmother    History  Substance Use Topics  . Smoking status: Former Smoker -- 2.00 packs/day for 30 years    Types: Cigarettes    Quit date: 06/17/1992  . Smokeless tobacco: Not on file  . Alcohol Use: 7.0 oz/week    14 drink(s) per week   OB History    No data available     Review of Systems  Constitutional: Negative for fever.  Respiratory: Negative for chest tightness and shortness of breath.   Cardiovascular: Negative for chest pain.  Gastrointestinal: Negative for nausea, vomiting and abdominal pain.  Genitourinary: Negative for dysuria.  Musculoskeletal: Negative for back pain.  Skin: Positive for wound.  Neurological: Negative for headaches.  Psychiatric/Behavioral: Positive for behavioral problems.  All other systems reviewed and are negative.     Allergies  Norvasc and Procardia  Home Medications   Prior to Admission medications   Medication Sig Start Date End Date Taking? Authorizing Provider  acetaminophen (TYLENOL) 500 MG tablet Take 500 mg by mouth every 6 (six) hours as needed for moderate pain. Pain   Yes Historical Provider, MD  allopurinol (ZYLOPRIM) 300 MG tablet Take 1 tablet (300 mg total) by mouth  daily. 09/11/14  Yes Lysbeth Penner, FNP  aspirin 325 MG tablet Take 1 tablet (325 mg total) by mouth daily. 10/22/13  Yes Velvet Bathe, MD  budesonide-formoterol [facility]) 160-4.5 MCG/ACT inhaler Inhale 2 puffs into the lungs 2 (two) times daily. 09/11/14  Yes Lysbeth Penner, FNP  cloNIDine (CATAPRES - DOSED IN MG/24 HR) 0.1 mg/24hr patch Place 1 patch (0.1 mg total) onto the skin every Thursday. 04/29/15  Yes Ripudeep Krystal Eaton, MD  DULoxetine (CYMBALTA) 60 MG capsule Take 1 capsule (60 mg total)  by mouth daily. 04/29/15  Yes Ripudeep Krystal Eaton, MD  feeding supplement, ENSURE ENLIVE, (ENSURE ENLIVE) LIQD Take 237 mLs by mouth 2 (two) times daily between meals. 04/29/15  Yes Ripudeep Krystal Eaton, MD  folic acid (FOLVITE) 1 MG tablet Take 1 tablet (1 mg total) by mouth daily. 10/28/13  Yes Lysbeth Penner, FNP  indomethacin (INDOCIN SR) 75 MG CR capsule Take 1 capsule (75 mg total) by mouth daily as needed (for gout). 04/29/15  Yes Ripudeep Krystal Eaton, MD  isosorbide mononitrate (IMDUR) 60 MG 24 hr tablet Take 1 tablet (60 mg total) by mouth daily. 04/29/15  Yes Ripudeep Krystal Eaton, MD  levothyroxine (SYNTHROID, LEVOTHROID) 75 MCG tablet Take 1 tablet (75 mcg total) by mouth daily before breakfast. 04/29/15  Yes Ripudeep K Rai, MD  medroxyPROGESTERone (PROVERA) 2.5 MG tablet Take 2.5 mg by mouth daily.  06/11/13  Yes Historical Provider, MD  metoprolol (LOPRESSOR) 100 MG tablet Take 1 tablet (100 mg total) by mouth 2 (two) times daily. 04/29/15  Yes Ripudeep Krystal Eaton, MD  potassium chloride SA (K-DUR,KLOR-CON) 20 MEQ tablet Take 1 tablet (20 mEq total) by mouth daily. 04/29/15  Yes Ripudeep Krystal Eaton, MD  PREMARIN 1.25 MG tablet Take 1.25 mg by mouth daily.  06/11/13  Yes Historical Provider, MD  thiamine (VITAMIN B-1) 100 MG tablet Take 1 tablet (100 mg total) by mouth daily. 10/28/13  Yes Lysbeth Penner, FNP  valsartan (DIOVAN) 320 MG tablet Take 1 tablet (320 mg total) by mouth daily. 09/11/14  Yes Lysbeth Penner, FNP   BP 184/91 mmHg  Pulse 86  Temp(Src) 97.9 F (36.6 C)  Resp 18  Wt 158 lb (71.668 kg)  SpO2 99%  LMP 08/21/2013 Physical Exam  Constitutional: No distress.  Chronically ill-appearing, no acute distress  HENT:  Head: Normocephalic.  Right Ear: External ear normal.  Left Ear: External ear normal.  Mouth/Throat: Oropharynx is clear and moist.  3 cm hematoma over the right forehead, no abrasions or lacerations  Eyes: EOM are normal. Pupils are equal, round, and reactive to light.  Neck: Normal range  of motion. Neck supple.  Cardiovascular: Normal rate, regular rhythm and normal heart sounds.   No murmur heard. Pulmonary/Chest: Effort normal and breath sounds normal. No respiratory distress. She has no wheezes.  Abdominal: Soft. There is no tenderness.  Musculoskeletal:  Normal range of motion of the bilateral hips and knees, no obvious deformities or foreshortening  Neurological: She is alert.  Oriented 2, moves all 4 extremities spontaneously  Skin: Skin is warm and dry.  Bruising noted over the bilateral upper extremities  Psychiatric: She has a normal mood and affect.  Nursing note and vitals reviewed.   ED Course  Procedures (including critical care time) Labs Review Labs Reviewed  URINALYSIS, ROUTINE W REFLEX MICROSCOPIC - Abnormal; Notable for the following:    Color, Urine ORANGE (*)    Hgb urine dipstick LARGE (*)  All other components within normal limits  URINE MICROSCOPIC-ADD ON - Abnormal; Notable for the following:    Squamous Epithelial / LPF FEW (*)    Bacteria, UA MANY (*)    All other components within normal limits    Imaging Review Ct Head Wo Contrast  04/30/2015   CLINICAL DATA:  Slipped out of wheelchair and hit head. Altered level of consciousness. History of hypertension, stroke, alcoholism, headache.  EXAM: CT HEAD WITHOUT CONTRAST  CT CERVICAL SPINE WITHOUT CONTRAST  TECHNIQUE: Multidetector CT imaging of the head and cervical spine was performed following the standard protocol without intravenous contrast. Multiplanar CT image reconstructions of the cervical spine were also generated.  COMPARISON:  MRI of the head Apr 28, 2015  FINDINGS: CT HEAD FINDINGS  Mildly motion degraded examination.  Moderate to severe ventriculomegaly, predominately on the basis of global parenchymal brain volume loss though there is mild sulcal effacement at the convexities. Confluent supratentorial white matter hypodensities corresponding to chronic small vessel ischemic  disease better seen on prior MRI. No midline shift, mass effect, acute large vascular territory infarct nor intraparenchymal hemorrhage.  No abnormal extra-axial fluid collections. Basal cisterns are patent. Mild calcific atherosclerosis the carotid siphons.  Bilateral ocular lens implants. 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 Visualized paranasal sinuses and mastoid air cells are well aerated. Moderate RIGHT frontal scalp hematoma without subcutaneous gas or radiopaque foreign bodies. No skull fracture.  CT CERVICAL SPINE FINDINGS  Cervical vertebral bodies and posterior elements are intact. Straightened cervical lordosis. RIGHT C3-4 facets are fused on degenerative basis. Grade 1 C7-T1 anterolisthesis without spondylolysis. Severe C5-6, moderate to severe C6-7 and C7-T1 disc height loss, endplate sclerosis of marginal spurring consistent with degenerative disc. Moderate to severe upper cervical facet arthropathy. Moderate to severe RIGHT C3-4, moderate to severe LEFT C5-6 neural foraminal narrowing. No destructive bony lesions. Included prevertebral and paraspinal soft tissues are nonsuspicious.  IMPRESSION: CT HEAD: No acute intracranial process. Moderate RIGHT frontal scalp hematoma. No skull fracture.  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 Moderate to severe white matter changes compatible chronic small vessel ischemic disease.  CT CERVICAL SPINE: Straightened cervical lordosis without acute fracture. Grade 1 C7-T1 anterolisthesis on degenerative basis.   Electronically Signed   By: Elon Alas   On: 04/30/2015 04:40   Ct Cervical Spine Wo Contrast  04/30/2015   CLINICAL DATA:  Slipped out of wheelchair and hit head. Altered level of consciousness. History of hypertension, stroke, alcoholism, headache.  EXAM: CT HEAD WITHOUT CONTRAST  CT CERVICAL SPINE WITHOUT CONTRAST  TECHNIQUE: Multidetector CT imaging of the head and  cervical spine was performed following the standard protocol without intravenous contrast. Multiplanar CT image reconstructions of the cervical spine were also generated.  COMPARISON:  MRI of the head Apr 28, 2015  FINDINGS: CT HEAD FINDINGS  Mildly motion degraded examination.  Moderate to severe ventriculomegaly, predominately on the basis of global parenchymal brain volume loss though there is mild sulcal effacement at the convexities. Confluent supratentorial white matter hypodensities corresponding to chronic small vessel ischemic disease better seen on prior MRI. No midline shift, mass effect, acute large vascular territory infarct nor intraparenchymal hemorrhage.  No abnormal extra-axial fluid collections. Basal cisterns are patent. Mild calcific atherosclerosis the carotid siphons.  Bilateral ocular lens implants. 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 Visualized paranasal sinuses and mastoid air cells are well aerated. Moderate RIGHT frontal scalp hematoma without subcutaneous gas or radiopaque foreign bodies. No  skull fracture.  CT CERVICAL SPINE FINDINGS  Cervical vertebral bodies and posterior elements are intact. Straightened cervical lordosis. RIGHT C3-4 facets are fused on degenerative basis. Grade 1 C7-T1 anterolisthesis without spondylolysis. Severe C5-6, moderate to severe C6-7 and C7-T1 disc height loss, endplate sclerosis of marginal spurring consistent with degenerative disc. Moderate to severe upper cervical facet arthropathy. Moderate to severe RIGHT C3-4, moderate to severe LEFT C5-6 neural foraminal narrowing. No destructive bony lesions. Included prevertebral and paraspinal soft tissues are nonsuspicious.  IMPRESSION: CT HEAD: No acute intracranial process. Moderate RIGHT frontal scalp hematoma. No skull fracture.  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 Moderate to severe white matter changes compatible chronic  small vessel ischemic disease.  CT CERVICAL SPINE: Straightened cervical lordosis without acute fracture. Grade 1 C7-T1 anterolisthesis on degenerative basis.   Electronically Signed   By: Elon Alas   On: 04/30/2015 04:40   Mr Brain Wo Contrast  04/28/2015   CLINICAL DATA:  Acute encephalopathy. Intermittent confusion which has progressively worsened in the last several days.  EXAM: MRI HEAD WITHOUT CONTRAST  TECHNIQUE: Multiplanar, multiecho pulse sequences of the brain and surrounding structures were obtained without intravenous contrast.  COMPARISON:  Head CT 04/22/2015 and MRI 10/21/2013  FINDINGS: The examination had to be discontinued prior to completion due to patient's inability to tolerate being in the scanner despite receiving medication and requesting to terminate the examination. All routine noncontrast brain MRI sequences were obtained except for axial T1 and coronal T2 weighted images.  Images are mildly degraded by motion artifact. There is no evidence of acute infarct, intracranial hemorrhage, mass, midline shift, or extra-axial fluid collection. Moderate cerebral atrophy is similar to the prior MRI. Periventricular white matter T2 hyperintensities have mildly progressed and are nonspecific but compatible with moderate chronic small vessel ischemic disease.  Prior bilateral cataract extraction is noted. Paranasal sinuses and mastoid air cells are clear. Major intracranial vascular flow voids are preserved.  IMPRESSION: 1. No acute intracranial abnormality. 2. Moderate chronic small vessel ischemic disease and cerebral atrophy.   Electronically Signed   By: Logan Bores   On: 04/28/2015 18:22     EKG Interpretation   Date/Time:  Friday Apr 30 2015 03:24:24 EDT Ventricular Rate:  83 PR Interval:  163 QRS Duration: 98 QT Interval:  385 QTC Calculation: 452 R Axis:   48 Text Interpretation:  Sinus rhythm Confirmed by Laniece Hornbaker  MD, Loma Sousa  ([id number]) on 04/30/2015 4:11:59 AM       MDM   Final diagnoses:  Fall, initial encounter  Hematoma of frontal scalp, initial encounter    Patient presents following a fall at her living facility. Details of the fall are unclear but patient endorses mechanical nature fall. She is otherwise uncooperative. ABCs intact. Vital signs notable for blood pressure of 184/91.  She has a hematoma to the forehead.  Only on aspirin. Unknown baseline. EKG reassuring urinalysis without evidence of urinary tract infection. CT head and neck obtained and shows unchanged volume loss and ischemic small vessel changes. No other signs of acute injury. Regarding blood pressure, blood pressure reviewed while in the hospital and range from 150s to 180s/80s to 90s.  No intervention taken at this time. Will discharge patient back to living facility.  After history, exam, and medical workup I feel the patient has been appropriately medically screened and is safe for discharge home. Pertinent diagnoses were discussed with the patient. Patient was given return precautions.  Merryl Hacker, MD 04/30/15 [phone]

## 2015-04-30 NOTE — ED Notes (Signed)
Pt slipped out of her wheelchair and hit her head.

## 2015-05-11 ENCOUNTER — Ambulatory Visit: Payer: Medicare Other | Admitting: Nurse Practitioner

## 2015-05-17 DIAGNOSIS — R4182 Altered mental status, unspecified: Secondary | ICD-10-CM | POA: Diagnosis not present

## 2015-05-17 DIAGNOSIS — E860 Dehydration: Secondary | ICD-10-CM | POA: Diagnosis not present

## 2015-05-17 DIAGNOSIS — N390 Urinary tract infection, site not specified: Secondary | ICD-10-CM | POA: Diagnosis not present

## 2015-05-17 DIAGNOSIS — E039 Hypothyroidism, unspecified: Secondary | ICD-10-CM | POA: Diagnosis not present

## 2015-05-17 DIAGNOSIS — F17201 Nicotine dependence, unspecified, in remission: Secondary | ICD-10-CM | POA: Diagnosis not present

## 2015-05-17 DIAGNOSIS — R531 Weakness: Secondary | ICD-10-CM | POA: Diagnosis not present

## 2015-05-17 DIAGNOSIS — D649 Anemia, unspecified: Secondary | ICD-10-CM | POA: Diagnosis not present

## 2015-05-17 DIAGNOSIS — S0990XA Unspecified injury of head, initial encounter: Secondary | ICD-10-CM | POA: Diagnosis not present

## 2015-05-17 DIAGNOSIS — K219 Gastro-esophageal reflux disease without esophagitis: Secondary | ICD-10-CM | POA: Diagnosis not present

## 2015-05-17 DIAGNOSIS — I959 Hypotension, unspecified: Secondary | ICD-10-CM | POA: Diagnosis not present

## 2015-05-17 DIAGNOSIS — R200 Anesthesia of skin: Secondary | ICD-10-CM | POA: Diagnosis not present

## 2015-05-17 DIAGNOSIS — D638 Anemia in other chronic diseases classified elsewhere: Secondary | ICD-10-CM | POA: Diagnosis not present

## 2015-05-17 DIAGNOSIS — F0390 Unspecified dementia without behavioral disturbance: Secondary | ICD-10-CM | POA: Diagnosis not present

## 2015-05-17 DIAGNOSIS — J449 Chronic obstructive pulmonary disease, unspecified: Secondary | ICD-10-CM | POA: Diagnosis not present

## 2015-05-17 DIAGNOSIS — Z888 Allergy status to other drugs, medicaments and biological substances status: Secondary | ICD-10-CM | POA: Diagnosis not present

## 2015-05-18 DIAGNOSIS — I959 Hypotension, unspecified: Secondary | ICD-10-CM | POA: Diagnosis not present

## 2015-05-18 DIAGNOSIS — K219 Gastro-esophageal reflux disease without esophagitis: Secondary | ICD-10-CM | POA: Diagnosis not present

## 2015-05-18 DIAGNOSIS — D638 Anemia in other chronic diseases classified elsewhere: Secondary | ICD-10-CM | POA: Diagnosis not present

## 2015-05-18 DIAGNOSIS — E860 Dehydration: Secondary | ICD-10-CM | POA: Diagnosis not present

## 2015-05-18 DIAGNOSIS — J449 Chronic obstructive pulmonary disease, unspecified: Secondary | ICD-10-CM | POA: Diagnosis not present

## 2015-05-18 DIAGNOSIS — F0390 Unspecified dementia without behavioral disturbance: Secondary | ICD-10-CM | POA: Diagnosis not present

## 2015-06-08 ENCOUNTER — Ambulatory Visit: Payer: Medicare Other | Admitting: Neurology

## 2015-06-08 ENCOUNTER — Telehealth: Payer: Self-pay | Admitting: *Deleted

## 2015-06-08 NOTE — Telephone Encounter (Signed)
Arbie Cookey called in to confirm the appt with Dr. Felecia Shelling today at 120 pm. I looked and noted that the appt was cancelled. She asked why it was cancelled and I told her that there was no way for me to look at that, it was automated. She told me that she would call back and reschedule if the pt needed it in the future. She thanked me

## 2015-06-08 NOTE — Telephone Encounter (Signed)
noted/fim 

## 2015-06-15 DIAGNOSIS — M25562 Pain in left knee: Secondary | ICD-10-CM | POA: Diagnosis not present

## 2015-07-03 DIAGNOSIS — Z7982 Long term (current) use of aspirin: Secondary | ICD-10-CM | POA: Diagnosis not present

## 2015-07-03 DIAGNOSIS — E039 Hypothyroidism, unspecified: Secondary | ICD-10-CM | POA: Diagnosis not present

## 2015-07-03 DIAGNOSIS — N189 Chronic kidney disease, unspecified: Secondary | ICD-10-CM | POA: Diagnosis not present

## 2015-07-03 DIAGNOSIS — K219 Gastro-esophageal reflux disease without esophagitis: Secondary | ICD-10-CM | POA: Diagnosis not present

## 2015-07-03 DIAGNOSIS — I69354 Hemiplegia and hemiparesis following cerebral infarction affecting left non-dominant side: Secondary | ICD-10-CM | POA: Diagnosis not present

## 2015-07-03 DIAGNOSIS — F329 Major depressive disorder, single episode, unspecified: Secondary | ICD-10-CM | POA: Diagnosis not present

## 2015-07-03 DIAGNOSIS — G9340 Encephalopathy, unspecified: Secondary | ICD-10-CM | POA: Diagnosis not present

## 2015-07-03 DIAGNOSIS — J449 Chronic obstructive pulmonary disease, unspecified: Secondary | ICD-10-CM | POA: Diagnosis not present

## 2015-07-03 DIAGNOSIS — Z87891 Personal history of nicotine dependence: Secondary | ICD-10-CM | POA: Diagnosis not present

## 2015-07-03 DIAGNOSIS — F0390 Unspecified dementia without behavioral disturbance: Secondary | ICD-10-CM | POA: Diagnosis not present

## 2015-07-03 DIAGNOSIS — F419 Anxiety disorder, unspecified: Secondary | ICD-10-CM | POA: Diagnosis not present

## 2015-07-03 DIAGNOSIS — I129 Hypertensive chronic kidney disease with stage 1 through stage 4 chronic kidney disease, or unspecified chronic kidney disease: Secondary | ICD-10-CM | POA: Diagnosis not present

## 2015-07-06 DIAGNOSIS — J449 Chronic obstructive pulmonary disease, unspecified: Secondary | ICD-10-CM | POA: Diagnosis not present

## 2015-07-06 DIAGNOSIS — G9340 Encephalopathy, unspecified: Secondary | ICD-10-CM | POA: Diagnosis not present

## 2015-07-06 DIAGNOSIS — F329 Major depressive disorder, single episode, unspecified: Secondary | ICD-10-CM | POA: Diagnosis not present

## 2015-07-06 DIAGNOSIS — I129 Hypertensive chronic kidney disease with stage 1 through stage 4 chronic kidney disease, or unspecified chronic kidney disease: Secondary | ICD-10-CM | POA: Diagnosis not present

## 2015-07-06 DIAGNOSIS — F0390 Unspecified dementia without behavioral disturbance: Secondary | ICD-10-CM | POA: Diagnosis not present

## 2015-07-06 DIAGNOSIS — N189 Chronic kidney disease, unspecified: Secondary | ICD-10-CM | POA: Diagnosis not present

## 2015-07-08 DIAGNOSIS — I129 Hypertensive chronic kidney disease with stage 1 through stage 4 chronic kidney disease, or unspecified chronic kidney disease: Secondary | ICD-10-CM | POA: Diagnosis not present

## 2015-07-08 DIAGNOSIS — G9340 Encephalopathy, unspecified: Secondary | ICD-10-CM | POA: Diagnosis not present

## 2015-07-08 DIAGNOSIS — N189 Chronic kidney disease, unspecified: Secondary | ICD-10-CM | POA: Diagnosis not present

## 2015-07-08 DIAGNOSIS — F0390 Unspecified dementia without behavioral disturbance: Secondary | ICD-10-CM | POA: Diagnosis not present

## 2015-07-08 DIAGNOSIS — F329 Major depressive disorder, single episode, unspecified: Secondary | ICD-10-CM | POA: Diagnosis not present

## 2015-07-08 DIAGNOSIS — J449 Chronic obstructive pulmonary disease, unspecified: Secondary | ICD-10-CM | POA: Diagnosis not present

## 2015-07-11 NOTE — Progress Notes (Signed)
Subjective:  Patient ID: [name], female    DOB: 1946/09/22  Age: 69 y.o. MRN: [id number]  CC: Medication Refill; Urinary Tract Infection; and nursinghome discharge   HPI [name] presents for first time to see Dr. Livia Snellen for To be seen for DC from nursing home. Recently hospitalized for Alcohol withdrawal and acute encephalopathy States she is done with drinking. Will never have alcohol again. She finds AA depressing though.  Also follow-up of hypertension. Patient has no history of headache chest pain or shortness of breath or recent cough. Patient also denies symptoms of TIA such as numbness weakness lateralizing. Patient checks  blood pressure at home and has not had any elevated readings recently. Patient denies side effects from his medication. States taking it regularly. Patient also presents for follow-up on  thyroid. She has a history of hypothyroidism for many years. It has been stable recently. Pt. denies any change in  voice, loss of hair, heat or cold intolerance. Energy level has been adequate to good. She denies constipation and diarrhea. No myxedema. Medication is as noted below. Verified that pt is taking it daily on an empty stomach. Well tolerated. Patient in for follow-up of elevated cholesterol. Although off med for now she has done well without complaints on statin. Denies side effects of statin including myalgia and arthralgia and nausea. Also in today for liver function testing. Currently no chest pain, shortness of breath or other cardiovascular related symptoms noted. Will resume med based on test results  History Preston has a past medical history of Hypertension; Menopause; Hypothyroid; Mini stroke; Hyperlipidemia; B12 deficiency; Shortness of breath; Headache(784.0); Anxiety; Pneumonia; Anemia; Depression; Stroke (2010); Alcoholism (01/09/2012); Hyponatremia (01/09/2012); Chronic kidney disease (CKD), stage II (mild); COPD (chronic obstructive pulmonary  disease); Orthostatic syncope (10/02/2012); and Carotid artery narrowing (10/03/2012).   first visit with Dr. Livia Snellen She has past surgical history that includes Appendectomy; Tonsillectomy; Cataract extraction w/PHACO (06/20/2012); and Cataract extraction w/PHACO (07/11/2012).   Her family history includes GER disease in her mother; Hypertension in her maternal grandmother.She reports that she quit smoking about 23 years ago. Her smoking use included Cigarettes. She has a 60 pack-year smoking history. She does not have any smokeless tobacco history on file. She reports that she drinks about 7.0 oz of alcohol per week. She reports that she does not use illicit drugs.  Outpatient Prescriptions Prior to Visit  Medication Sig Dispense Refill  . allopurinol (ZYLOPRIM) 300 MG tablet Take 1 tablet (300 mg total) by mouth daily. 30 tablet 11  . budesonide-formoterol (SYMBICORT) 160-4.5 MCG/ACT inhaler Inhale 2 puffs into the lungs 2 (two) times daily. 1 Inhaler 11  . acetaminophen (TYLENOL) 500 MG tablet Take 500 mg by mouth every 6 (six) hours as needed for moderate pain. Pain    . DULoxetine (CYMBALTA) 60 MG capsule Take 1 capsule (60 mg total) by mouth daily. 30 capsule 0  . folic acid (FOLVITE) 1 MG tablet Take 1 tablet (1 mg total) by mouth daily. 30 tablet 11  . thiamine (VITAMIN B-1) 100 MG tablet Take 1 tablet (100 mg total) by mouth daily. 30 tablet 11  . indomethacin (INDOCIN SR) 75 MG CR capsule Take 1 capsule (75 mg total) by mouth daily as needed (for gout). (Patient not taking: Reported on 07/12/2015) 60 capsule 5  . aspirin 325 MG tablet Take 1 tablet (325 mg total) by mouth daily. (Patient not taking: Reported on 07/12/2015) 30 tablet 0  . cloNIDine (CATAPRES - DOSED IN  MG/24 HR) 0.1 mg/24hr patch Place 1 patch (0.1 mg total) onto the skin every Thursday. (Patient not taking: Reported on 07/12/2015) 4 patch 3  . feeding supplement, ENSURE ENLIVE, (ENSURE ENLIVE) LIQD Take 237 mLs by mouth 2 (two)  times daily between meals. (Patient not taking: Reported on 07/12/2015) 237 mL 12  . isosorbide mononitrate (IMDUR) 60 MG 24 hr tablet Take 1 tablet (60 mg total) by mouth daily. (Patient not taking: Reported on 07/12/2015) 30 tablet 0  . levothyroxine (SYNTHROID, LEVOTHROID) 75 MCG tablet Take 1 tablet (75 mcg total) by mouth daily before breakfast. (Patient not taking: Reported on 07/12/2015)    . medroxyPROGESTERone (PROVERA) 2.5 MG tablet Take 2.5 mg by mouth daily.     . metoprolol (LOPRESSOR) 100 MG tablet Take 1 tablet (100 mg total) by mouth 2 (two) times daily. (Patient not taking: Reported on 07/12/2015)    . potassium chloride SA (K-DUR,KLOR-CON) 20 MEQ tablet Take 1 tablet (20 mEq total) by mouth daily. (Patient not taking: Reported on 07/12/2015)    . PREMARIN 1.25 MG tablet Take 1.25 mg by mouth daily.     . valsartan (DIOVAN) 320 MG tablet Take 1 tablet (320 mg total) by mouth daily. (Patient not taking: Reported on 07/12/2015) 30 tablet 11   No facility-administered medications prior to visit.    ROS Review of Systems  Constitutional: Negative for fever, chills, diaphoresis, appetite change, fatigue and unexpected weight change.  HENT: Negative for congestion, ear pain, hearing loss, postnasal drip, rhinorrhea, sneezing, sore throat and trouble swallowing.   Eyes: Negative for pain.  Respiratory: Negative for cough, chest tightness and shortness of breath.   Cardiovascular: Negative for chest pain and palpitations.  Gastrointestinal: Negative for nausea, vomiting, abdominal pain, diarrhea and constipation.  Genitourinary: Negative for dysuria, frequency and menstrual problem.  Musculoskeletal: Negative for joint swelling and arthralgias.  Skin: Negative for rash.  Neurological: Negative for dizziness, weakness, numbness and headaches.  Psychiatric/Behavioral: Negative for dysphoric mood and agitation.    Objective:  BP 158/85 mmHg  Pulse 90  Temp(Src) 97.4 F (36.3 C) (Oral)  Ht  5\' 9"  (1.753 m)  Wt 163 lb 9.6 oz (74.208 kg)  BMI 24.15 kg/m2  LMP 08/21/2013  BP Readings from Last 3 Encounters:  07/12/15 158/85  04/30/15 196/103  04/29/15 118/58    Wt Readings from Last 3 Encounters:  07/12/15 163 lb 9.6 oz (74.208 kg)  04/30/15 158 lb (71.668 kg)  04/29/15 158 lb 11.7 oz (72 kg)     Physical Exam  Constitutional: She is oriented to person, place, and time. She appears well-developed and well-nourished. No distress.  HENT:  Head: Normocephalic and atraumatic.  Right Ear: External ear normal.  Left Ear: External ear normal.  Nose: Nose normal.  Mouth/Throat: Oropharynx is clear and moist.  Eyes: Conjunctivae and EOM are normal. Pupils are equal, round, and reactive to light.  Neck: Normal range of motion. Neck supple. No thyromegaly present.  Cardiovascular: Normal rate, regular rhythm and normal heart sounds.   No murmur heard. Pulmonary/Chest: Effort normal and breath sounds normal. No respiratory distress. She has no wheezes. She has no rales.  Abdominal: Soft. Bowel sounds are normal. She exhibits no distension. There is no tenderness.  Lymphadenopathy:    She has no cervical adenopathy.  Neurological: She is alert and oriented to person, place, and time. She has normal reflexes.  Skin: Skin is warm and dry.  Psychiatric: She has a normal mood and affect. Her behavior  is normal. Judgment and thought content normal.    Lab Results  Component Value Date   HGBA1C 4.7 11/13/2013   HGBA1C 5.0 10/22/2013    Lab Results  Component Value Date   WBC 4.2 04/28/2015   HGB 9.2* 04/28/2015   HCT 29.6* 04/28/2015   PLT 171 04/28/2015   GLUCOSE 85 04/28/2015   CHOL 177 10/22/2013   TRIG 289* 10/22/2013   HDL 39* 10/22/2013   LDLCALC 80 10/22/2013   ALT 12* 04/28/2015   AST 18 04/28/2015   NA 137 04/28/2015   K 3.5 04/28/2015   CL 108 04/28/2015   CREATININE 0.83 04/28/2015   BUN 8 04/28/2015   CO2 21* 04/28/2015   TSH 0.074* 04/22/2015    INR 1.07 04/28/2015   HGBA1C 4.7 11/13/2013    Ct Head Wo Contrast  04/30/2015   CLINICAL DATA:  Slipped out of wheelchair and hit head. Altered level of consciousness. History of hypertension, stroke, alcoholism, headache.  EXAM: CT HEAD WITHOUT CONTRAST  CT CERVICAL SPINE WITHOUT CONTRAST  TECHNIQUE: Multidetector CT imaging of the head and cervical spine was performed following the standard protocol without intravenous contrast. Multiplanar CT image reconstructions of the cervical spine were also generated.  COMPARISON:  MRI of the head Apr 28, 2015  FINDINGS: CT HEAD FINDINGS  Mildly motion degraded examination.  Moderate to severe ventriculomegaly, predominately on the basis of global parenchymal brain volume loss though there is mild sulcal effacement at the convexities. Confluent supratentorial white matter hypodensities corresponding to chronic small vessel ischemic disease better seen on prior MRI. No midline shift, mass effect, acute large vascular territory infarct nor intraparenchymal hemorrhage.  No abnormal extra-axial fluid collections. Basal cisterns are patent. Mild calcific atherosclerosis the carotid siphons.  Bilateral ocular lens implants. 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 Visualized paranasal sinuses and mastoid air cells are well aerated. Moderate RIGHT frontal scalp hematoma without subcutaneous gas or radiopaque foreign bodies. No skull fracture.  CT CERVICAL SPINE FINDINGS  Cervical vertebral bodies and posterior elements are intact. Straightened cervical lordosis. RIGHT C3-4 facets are fused on degenerative basis. Grade 1 C7-T1 anterolisthesis without spondylolysis. Severe C5-6, moderate to severe C6-7 and C7-T1 disc height loss, endplate sclerosis of marginal spurring consistent with degenerative disc. Moderate to severe upper cervical facet arthropathy. Moderate to severe RIGHT C3-4, moderate to severe LEFT C5-6 neural foraminal narrowing. No destructive bony  lesions. Included prevertebral and paraspinal soft tissues are nonsuspicious.  IMPRESSION: CT HEAD: No acute intracranial process. Moderate RIGHT frontal scalp hematoma. No skull fracture.  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 Moderate to severe white matter changes compatible chronic small vessel ischemic disease.  CT CERVICAL SPINE: Straightened cervical lordosis without acute fracture. Grade 1 C7-T1 anterolisthesis on degenerative basis.   Electronically Signed   By: Elon Alas   On: 04/30/2015 04:40   Ct Cervical Spine Wo Contrast  04/30/2015   CLINICAL DATA:  Slipped out of wheelchair and hit head. Altered level of consciousness. History of hypertension, stroke, alcoholism, headache.  EXAM: CT HEAD WITHOUT CONTRAST  CT CERVICAL SPINE WITHOUT CONTRAST  TECHNIQUE: Multidetector CT imaging of the head and cervical spine was performed following the standard protocol without intravenous contrast. Multiplanar CT image reconstructions of the cervical spine were also generated.  COMPARISON:  MRI of the head Apr 28, 2015  FINDINGS: CT HEAD FINDINGS  Mildly motion degraded examination.  Moderate to severe ventriculomegaly, predominately on the basis of global  parenchymal brain volume loss though there is mild sulcal effacement at the convexities. Confluent supratentorial white matter hypodensities corresponding to chronic small vessel ischemic disease better seen on prior MRI. No midline shift, mass effect, acute large vascular territory infarct nor intraparenchymal hemorrhage.  No abnormal extra-axial fluid collections. Basal cisterns are patent. Mild calcific atherosclerosis the carotid siphons.  Bilateral ocular lens implants. 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 Visualized paranasal sinuses and mastoid air cells are well aerated. Moderate RIGHT frontal scalp hematoma without subcutaneous gas or radiopaque foreign bodies. No  skull fracture.  CT CERVICAL SPINE FINDINGS  Cervical vertebral bodies and posterior elements are intact. Straightened cervical lordosis. RIGHT C3-4 facets are fused on degenerative basis. Grade 1 C7-T1 anterolisthesis without spondylolysis. Severe C5-6, moderate to severe C6-7 and C7-T1 disc height loss, endplate sclerosis of marginal spurring consistent with degenerative disc. Moderate to severe upper cervical facet arthropathy. Moderate to severe RIGHT C3-4, moderate to severe LEFT C5-6 neural foraminal narrowing. No destructive bony lesions. Included prevertebral and paraspinal soft tissues are nonsuspicious.  IMPRESSION: CT HEAD: No acute intracranial process. Moderate RIGHT frontal scalp hematoma. No skull fracture.  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 Moderate to severe white matter changes compatible chronic small vessel ischemic disease.  CT CERVICAL SPINE: Straightened cervical lordosis without acute fracture. Grade 1 C7-T1 anterolisthesis on degenerative basis.   Electronically Signed   By: Elon Alas   On: 04/30/2015 04:40    Assessment & Plan:   H[place]ah was seen today for medication refill, urinary tract infection and nursinghome discharge.  Diagnoses and all orders for this visit:  Essential hypertension  Hyperlipidemia  Other specified hypothyroidism Orders: -     T4, Free -     TSH  Dysuria Orders: -     POCT urinalysis dipstick -     POCT UA - Microscopic Only -     Urine culture  Depression Orders: -     DULoxetine (CYMBALTA) 60 MG capsule; Take 1 capsule (60 mg total) by mouth daily.  Alcoholism Orders: -     folic acid (FOLVITE) 1 MG tablet; Take 1 tablet (1 mg total) by mouth daily. -     thiamine (VITAMIN B-1) 100 MG tablet; Take 1 tablet (100 mg total) by mouth daily.  Other orders -     acetaminophen (TYLENOL) 500 MG tablet; Take 1 tablet (500 mg total) by mouth every 6 (six) hours as  needed for moderate pain. Pain -     levothyroxine (SYNTHROID, LEVOTHROID) 50 MCG tablet; Take 1 tablet (50 mcg total) by mouth daily. -     ciprofloxacin (CIPRO) 500 MG tablet; Take 1 tablet (500 mg total) by mouth 2 (two) times daily. -     valsartan (DIOVAN) 160 MG tablet; Take 1 tablet (160 mg total) by mouth daily. 1 tablet daily x 1 wk Then 2 tablets daily   I have discontinued Ms. Price's medroxyPROGESTERone, PREMARIN, aspirin, valsartan, cloNIDine, feeding supplement (ENSURE ENLIVE), levothyroxine, metoprolol, potassium chloride SA, and isosorbide mononitrate. I have also changed her acetaminophen and levothyroxine. Additionally, I am having her start on ciprofloxacin and valsartan. Lastly, I am having her maintain her allopurinol, budesonide-formoterol, indomethacin, aspirin EC, DULoxetine, folic acid, and thiamine.  Meds ordered this encounter  Medications  . DISCONTD: levothyroxine (SYNTHROID, LEVOTHROID) 50 MCG tablet    Sig: Take 1 tablet by mouth daily.  Marland Kitchen aspirin EC 81 MG tablet    Sig: Take 81  mg by mouth daily.  Marland Kitchen acetaminophen (TYLENOL) 500 MG tablet    Sig: Take 1 tablet (500 mg total) by mouth every 6 (six) hours as needed for moderate pain. Pain    Dispense:  90 tablet    Refill:  4  . DULoxetine (CYMBALTA) 60 MG capsule    Sig: Take 1 capsule (60 mg total) by mouth daily.    Dispense:  90 capsule    Refill:  3  . folic acid (FOLVITE) 1 MG tablet    Sig: Take 1 tablet (1 mg total) by mouth daily.    Dispense:  90 tablet    Refill:  3  . levothyroxine (SYNTHROID, LEVOTHROID) 50 MCG tablet    Sig: Take 1 tablet (50 mcg total) by mouth daily.    Dispense:  90 tablet    Refill:  3  . thiamine (VITAMIN B-1) 100 MG tablet    Sig: Take 1 tablet (100 mg total) by mouth daily.    Dispense:  90 tablet    Refill:  3  . ciprofloxacin (CIPRO) 500 MG tablet    Sig: Take 1 tablet (500 mg total) by mouth 2 (two) times daily.    Dispense:  10 tablet    Refill:  0  .  valsartan (DIOVAN) 160 MG tablet    Sig: Take 1 tablet (160 mg total) by mouth daily. 1 tablet daily x 1 wk Then 2 tablets daily    Dispense:  40 tablet    Refill:  0   Discussed the critical need for support group to succesfully transition away from alcohol. Encouraged her to contact AA. If th local meeting is not acceptable, there are others that are available close by. Additionally, Family support is critical, but does not replace AA.   Cholesterol elevated in past, but after the lifestyle change involved with stopping alcohol, she would like to see test results before resuming statin.  She was encouraged to follow a low salt and low-fat diet.-Handout for DASH diet given.  Based on the stability of her thyroid symptoms currently she should continue her current dose but a change may be necessary based on her TSH and free T4.  Recent hospitalization and subsequent rehabilitation stay documentation was reviewed in detail showing the presence of alcoholic encephalopathy, acute renal insufficiency with associated elevated electrolyte disturbances including hypokalemia and hyponatremia, hypertensive urgency and multiple complications related to those.  Follow-up: Return in about 1 month (around 08/12/2015).  Claretta Fraise, M.D.

## 2015-07-12 ENCOUNTER — Ambulatory Visit (INDEPENDENT_AMBULATORY_CARE_PROVIDER_SITE_OTHER): Payer: Medicare Other | Admitting: Family Medicine

## 2015-07-12 VITALS — BP 158/85 | HR 90 | Temp 97.4°F | Ht 69.0 in | Wt 163.6 lb

## 2015-07-12 DIAGNOSIS — F1020 Alcohol dependence, uncomplicated: Secondary | ICD-10-CM | POA: Diagnosis not present

## 2015-07-12 DIAGNOSIS — R300 Dysuria: Secondary | ICD-10-CM | POA: Diagnosis not present

## 2015-07-12 DIAGNOSIS — E785 Hyperlipidemia, unspecified: Secondary | ICD-10-CM

## 2015-07-12 DIAGNOSIS — F329 Major depressive disorder, single episode, unspecified: Secondary | ICD-10-CM

## 2015-07-12 DIAGNOSIS — F32A Depression, unspecified: Secondary | ICD-10-CM

## 2015-07-12 DIAGNOSIS — E038 Other specified hypothyroidism: Secondary | ICD-10-CM | POA: Diagnosis not present

## 2015-07-12 DIAGNOSIS — I10 Essential (primary) hypertension: Secondary | ICD-10-CM | POA: Diagnosis not present

## 2015-07-12 LAB — POCT URINALYSIS DIPSTICK
Bilirubin, UA: NEGATIVE
Glucose, UA: NEGATIVE
Ketones, UA: NEGATIVE
Nitrite, UA: POSITIVE
Protein, UA: NEGATIVE
Spec Grav, UA: 1.01
Urobilinogen, UA: NEGATIVE
pH, UA: 6.5

## 2015-07-12 LAB — POCT UA - MICROSCOPIC ONLY
Casts, Ur, LPF, POC: NEGATIVE
Crystals, Ur, HPF, POC: NEGATIVE
Mucus, UA: NEGATIVE
Yeast, UA: NEGATIVE

## 2015-07-12 MED ORDER — VITAMIN B-1 100 MG PO TABS: 100.0000 mg | ORAL_TABLET | Freq: Every day | ORAL | Status: DC

## 2015-07-12 MED ORDER — ACETAMINOPHEN 500 MG PO TABS: 500.0000 mg | ORAL_TABLET | Freq: Four times a day (QID) | ORAL | Status: DC | PRN

## 2015-07-12 MED ORDER — FOLIC ACID 1 MG PO TABS: 1.0000 mg | ORAL_TABLET | Freq: Every day | ORAL | Status: DC

## 2015-07-12 MED ORDER — VALSARTAN 160 MG PO TABS: 160.0000 mg | ORAL_TABLET | Freq: Every day | ORAL | Status: DC

## 2015-07-12 MED ORDER — LEVOTHYROXINE SODIUM 50 MCG PO TABS: 50.0000 ug | ORAL_TABLET | Freq: Every day | ORAL | Status: DC

## 2015-07-12 MED ORDER — DULOXETINE HCL 60 MG PO CPEP: 60.0000 mg | ORAL_CAPSULE | Freq: Every day | ORAL | Status: DC

## 2015-07-12 MED ORDER — CIPROFLOXACIN HCL 500 MG PO TABS: 500.0000 mg | ORAL_TABLET | Freq: Two times a day (BID) | ORAL | Status: DC

## 2015-07-12 NOTE — Addendum Note (Signed)
Addended by: Claretta Fraise on: 07/12/2015 07:18 PM   Modules accepted: Miquel Dunn

## 2015-07-12 NOTE — Patient Instructions (Signed)
DASH Eating Plan °DASH stands for "Dietary Approaches to Stop Hypertension." The DASH eating plan is a healthy eating plan that has been shown to reduce high blood pressure (hypertension). Additional health benefits may include reducing the risk of type 2 diabetes mellitus, heart disease, and stroke. The DASH eating plan may also help with weight loss. °WHAT DO I NEED TO KNOW ABOUT THE DASH EATING PLAN? °For the DASH eating plan, you will follow these general guidelines: °· Choose foods with a percent daily value for sodium of less than 5% (as listed on the food label). °· Use salt-free seasonings or herbs instead of table salt or sea salt. °· Check with your health care provider or pharmacist before using salt substitutes. °· Eat lower-sodium products, often labeled as "lower sodium" or "no salt added." °· Eat fresh foods. °· Eat more vegetables, fruits, and low-fat dairy products. °· Choose whole grains. Look for the word "whole" as the first word in the ingredient list. °· Choose fish and skinless chicken or turkey more often than red meat. Limit fish, poultry, and meat to 6 oz (170 g) each day. °· Limit sweets, desserts, sugars, and sugary drinks. °· Choose heart-healthy fats. °· Limit cheese to 1 oz (28 g) per day. °· Eat more home-cooked food and less restaurant, buffet, and fast food. °· Limit fried foods. °· Cook foods using methods other than frying. °· Limit canned vegetables. If you do use them, rinse them well to decrease the sodium. °· When eating at a restaurant, ask that your food be prepared with less salt, or no salt if possible. °WHAT FOODS CAN I EAT? °Seek help from a dietitian for individual calorie needs. °Grains °Whole grain or whole wheat bread. Brown rice. Whole grain or whole wheat pasta. Quinoa, bulgur, and whole grain cereals. Low-sodium cereals. Corn or whole wheat flour tortillas. Whole grain cornbread. Whole grain crackers. Low-sodium crackers. °Vegetables °Fresh or frozen vegetables  (raw, steamed, roasted, or grilled). Low-sodium or reduced-sodium tomato and vegetable juices. Low-sodium or reduced-sodium tomato sauce and paste. Low-sodium or reduced-sodium canned vegetables.  °Fruits °All fresh, canned (in natural juice), or frozen fruits. °Meat and Other Protein Products °Ground beef (85% or leaner), grass-fed beef, or beef trimmed of fat. Skinless chicken or turkey. Ground chicken or turkey. Pork trimmed of fat. All fish and seafood. Eggs. Dried beans, peas, or lentils. Unsalted nuts and seeds. Unsalted canned beans. °Dairy °Low-fat dairy products, such as skim or 1% milk, 2% or reduced-fat cheeses, low-fat ricotta or cottage cheese, or plain low-fat yogurt. Low-sodium or reduced-sodium cheeses. °Fats and Oils °Tub margarines without trans fats. Light or reduced-fat mayonnaise and salad dressings (reduced sodium). Avocado. Safflower, olive, or canola oils. Natural peanut or almond butter. °Other °Unsalted popcorn and pretzels. °The items listed above may not be a complete list of recommended foods or beverages. Contact your dietitian for more options. °WHAT FOODS ARE NOT RECOMMENDED? °Grains °White bread. White pasta. White rice. Refined cornbread. Bagels and croissants. Crackers that contain trans fat. °Vegetables °Creamed or fried vegetables. Vegetables in a cheese sauce. Regular canned vegetables. Regular canned tomato sauce and paste. Regular tomato and vegetable juices. °Fruits °Dried fruits. Canned fruit in light or heavy syrup. Fruit juice. °Meat and Other Protein Products °Fatty cuts of meat. Ribs, chicken wings, bacon, sausage, bologna, salami, chitterlings, fatback, hot dogs, bratwurst, and packaged luncheon meats. Salted nuts and seeds. Canned beans with salt. °Dairy °Whole or 2% milk, cream, half-and-half, and cream cheese. Whole-fat or sweetened yogurt. Full-fat   cheeses or blue cheese. Nondairy creamers and whipped toppings. Processed cheese, cheese spreads, or cheese  curds. °Condiments °Onion and garlic salt, seasoned salt, table salt, and sea salt. Canned and packaged gravies. Worcestershire sauce. Tartar sauce. Barbecue sauce. Teriyaki sauce. Soy sauce, including reduced sodium. Steak sauce. Fish sauce. Oyster sauce. Cocktail sauce. Horseradish. Ketchup and mustard. Meat flavorings and tenderizers. Bouillon cubes. Hot sauce. Tabasco sauce. Marinades. Taco seasonings. Relishes. °Fats and Oils °Butter, stick margarine, lard, shortening, ghee, and bacon fat. Coconut, palm kernel, or palm oils. Regular salad dressings. °Other °Pickles and olives. Salted popcorn and pretzels. °The items listed above may not be a complete list of foods and beverages to avoid. Contact your dietitian for more information. °WHERE CAN I FIND MORE INFORMATION? °National [employer], [employer], [employer]: www.nhlbi.nih.gov/health/health-topics/topics/dash/ °Document Released: 11/16/2011 Document Revised: 04/13/2014 Document Reviewed: 10/01/2013 °Exit[employer]® Patient Information ©2015 [employer]. This information is not intended to replace advice given to you by your health care provider. Make sure you discuss any questions you have with your health care provider. ° °

## 2015-07-13 DIAGNOSIS — I129 Hypertensive chronic kidney disease with stage 1 through stage 4 chronic kidney disease, or unspecified chronic kidney disease: Secondary | ICD-10-CM | POA: Diagnosis not present

## 2015-07-13 DIAGNOSIS — N189 Chronic kidney disease, unspecified: Secondary | ICD-10-CM | POA: Diagnosis not present

## 2015-07-13 DIAGNOSIS — F329 Major depressive disorder, single episode, unspecified: Secondary | ICD-10-CM | POA: Diagnosis not present

## 2015-07-13 DIAGNOSIS — J449 Chronic obstructive pulmonary disease, unspecified: Secondary | ICD-10-CM | POA: Diagnosis not present

## 2015-07-13 DIAGNOSIS — F0390 Unspecified dementia without behavioral disturbance: Secondary | ICD-10-CM | POA: Diagnosis not present

## 2015-07-13 DIAGNOSIS — G9340 Encephalopathy, unspecified: Secondary | ICD-10-CM | POA: Diagnosis not present

## 2015-07-13 LAB — TSH: TSH: 74.01 u[IU]/mL — ABNORMAL HIGH (ref 0.450–4.500)

## 2015-07-13 LAB — T4, FREE: Free T4: 0.83 ng/dL (ref 0.82–1.77)

## 2015-07-14 DIAGNOSIS — J449 Chronic obstructive pulmonary disease, unspecified: Secondary | ICD-10-CM | POA: Diagnosis not present

## 2015-07-14 DIAGNOSIS — F0390 Unspecified dementia without behavioral disturbance: Secondary | ICD-10-CM | POA: Diagnosis not present

## 2015-07-14 DIAGNOSIS — N189 Chronic kidney disease, unspecified: Secondary | ICD-10-CM | POA: Diagnosis not present

## 2015-07-14 DIAGNOSIS — I129 Hypertensive chronic kidney disease with stage 1 through stage 4 chronic kidney disease, or unspecified chronic kidney disease: Secondary | ICD-10-CM | POA: Diagnosis not present

## 2015-07-14 DIAGNOSIS — G9340 Encephalopathy, unspecified: Secondary | ICD-10-CM | POA: Diagnosis not present

## 2015-07-14 DIAGNOSIS — F329 Major depressive disorder, single episode, unspecified: Secondary | ICD-10-CM | POA: Diagnosis not present

## 2015-07-14 LAB — URINE CULTURE

## 2015-07-16 ENCOUNTER — Telehealth: Payer: Self-pay | Admitting: Family Medicine

## 2015-07-16 ENCOUNTER — Other Ambulatory Visit: Payer: Self-pay | Admitting: Family Medicine

## 2015-07-16 ENCOUNTER — Ambulatory Visit: Payer: Medicare Other | Admitting: Family Medicine

## 2015-07-16 DIAGNOSIS — I129 Hypertensive chronic kidney disease with stage 1 through stage 4 chronic kidney disease, or unspecified chronic kidney disease: Secondary | ICD-10-CM | POA: Diagnosis not present

## 2015-07-16 DIAGNOSIS — J449 Chronic obstructive pulmonary disease, unspecified: Secondary | ICD-10-CM | POA: Diagnosis not present

## 2015-07-16 DIAGNOSIS — F329 Major depressive disorder, single episode, unspecified: Secondary | ICD-10-CM | POA: Diagnosis not present

## 2015-07-16 DIAGNOSIS — N189 Chronic kidney disease, unspecified: Secondary | ICD-10-CM | POA: Diagnosis not present

## 2015-07-16 DIAGNOSIS — F0390 Unspecified dementia without behavioral disturbance: Secondary | ICD-10-CM | POA: Diagnosis not present

## 2015-07-16 DIAGNOSIS — G9340 Encephalopathy, unspecified: Secondary | ICD-10-CM | POA: Diagnosis not present

## 2015-07-16 NOTE — Telephone Encounter (Signed)
Left message for pt that RX for bp med was called into [facility]

## 2015-07-16 NOTE — Telephone Encounter (Signed)
Patient aware that diovan 160 was sent to pharmacy.

## 2015-07-19 ENCOUNTER — Telehealth: Payer: Self-pay

## 2015-07-19 NOTE — Telephone Encounter (Signed)
Message given to patient. States she understands.

## 2015-07-19 NOTE — Telephone Encounter (Signed)
-----   Message from Claretta Fraise, MD sent at 07/16/2015  6:30 PM EDT ----- Go back to 75

## 2015-07-20 DIAGNOSIS — G9340 Encephalopathy, unspecified: Secondary | ICD-10-CM | POA: Diagnosis not present

## 2015-07-20 DIAGNOSIS — F0390 Unspecified dementia without behavioral disturbance: Secondary | ICD-10-CM | POA: Diagnosis not present

## 2015-07-20 DIAGNOSIS — N189 Chronic kidney disease, unspecified: Secondary | ICD-10-CM | POA: Diagnosis not present

## 2015-07-20 DIAGNOSIS — J449 Chronic obstructive pulmonary disease, unspecified: Secondary | ICD-10-CM | POA: Diagnosis not present

## 2015-07-20 DIAGNOSIS — F329 Major depressive disorder, single episode, unspecified: Secondary | ICD-10-CM | POA: Diagnosis not present

## 2015-07-20 DIAGNOSIS — I129 Hypertensive chronic kidney disease with stage 1 through stage 4 chronic kidney disease, or unspecified chronic kidney disease: Secondary | ICD-10-CM | POA: Diagnosis not present

## 2015-07-21 DIAGNOSIS — N189 Chronic kidney disease, unspecified: Secondary | ICD-10-CM | POA: Diagnosis not present

## 2015-07-21 DIAGNOSIS — G9340 Encephalopathy, unspecified: Secondary | ICD-10-CM | POA: Diagnosis not present

## 2015-07-21 DIAGNOSIS — I129 Hypertensive chronic kidney disease with stage 1 through stage 4 chronic kidney disease, or unspecified chronic kidney disease: Secondary | ICD-10-CM | POA: Diagnosis not present

## 2015-07-21 DIAGNOSIS — F0390 Unspecified dementia without behavioral disturbance: Secondary | ICD-10-CM | POA: Diagnosis not present

## 2015-07-21 DIAGNOSIS — F329 Major depressive disorder, single episode, unspecified: Secondary | ICD-10-CM | POA: Diagnosis not present

## 2015-07-21 DIAGNOSIS — J449 Chronic obstructive pulmonary disease, unspecified: Secondary | ICD-10-CM | POA: Diagnosis not present

## 2015-07-22 DIAGNOSIS — I129 Hypertensive chronic kidney disease with stage 1 through stage 4 chronic kidney disease, or unspecified chronic kidney disease: Secondary | ICD-10-CM | POA: Diagnosis not present

## 2015-07-22 DIAGNOSIS — F329 Major depressive disorder, single episode, unspecified: Secondary | ICD-10-CM | POA: Diagnosis not present

## 2015-07-22 DIAGNOSIS — G9340 Encephalopathy, unspecified: Secondary | ICD-10-CM | POA: Diagnosis not present

## 2015-07-22 DIAGNOSIS — F0390 Unspecified dementia without behavioral disturbance: Secondary | ICD-10-CM | POA: Diagnosis not present

## 2015-07-22 DIAGNOSIS — J449 Chronic obstructive pulmonary disease, unspecified: Secondary | ICD-10-CM | POA: Diagnosis not present

## 2015-07-22 DIAGNOSIS — N189 Chronic kidney disease, unspecified: Secondary | ICD-10-CM | POA: Diagnosis not present

## 2015-07-27 DIAGNOSIS — G9340 Encephalopathy, unspecified: Secondary | ICD-10-CM | POA: Diagnosis not present

## 2015-07-27 DIAGNOSIS — F0390 Unspecified dementia without behavioral disturbance: Secondary | ICD-10-CM | POA: Diagnosis not present

## 2015-07-27 DIAGNOSIS — I129 Hypertensive chronic kidney disease with stage 1 through stage 4 chronic kidney disease, or unspecified chronic kidney disease: Secondary | ICD-10-CM | POA: Diagnosis not present

## 2015-07-27 DIAGNOSIS — F329 Major depressive disorder, single episode, unspecified: Secondary | ICD-10-CM | POA: Diagnosis not present

## 2015-07-27 DIAGNOSIS — N189 Chronic kidney disease, unspecified: Secondary | ICD-10-CM | POA: Diagnosis not present

## 2015-07-27 DIAGNOSIS — J449 Chronic obstructive pulmonary disease, unspecified: Secondary | ICD-10-CM | POA: Diagnosis not present

## 2015-08-05 ENCOUNTER — Other Ambulatory Visit (INDEPENDENT_AMBULATORY_CARE_PROVIDER_SITE_OTHER): Payer: Medicare Other

## 2015-08-05 DIAGNOSIS — N182 Chronic kidney disease, stage 2 (mild): Secondary | ICD-10-CM | POA: Diagnosis not present

## 2015-08-05 DIAGNOSIS — I10 Essential (primary) hypertension: Secondary | ICD-10-CM

## 2015-08-05 DIAGNOSIS — F0390 Unspecified dementia without behavioral disturbance: Secondary | ICD-10-CM | POA: Diagnosis not present

## 2015-08-05 DIAGNOSIS — I129 Hypertensive chronic kidney disease with stage 1 through stage 4 chronic kidney disease, or unspecified chronic kidney disease: Secondary | ICD-10-CM | POA: Diagnosis not present

## 2015-08-05 DIAGNOSIS — F329 Major depressive disorder, single episode, unspecified: Secondary | ICD-10-CM | POA: Diagnosis not present

## 2015-08-05 DIAGNOSIS — G9340 Encephalopathy, unspecified: Secondary | ICD-10-CM | POA: Diagnosis not present

## 2015-08-05 DIAGNOSIS — J449 Chronic obstructive pulmonary disease, unspecified: Secondary | ICD-10-CM | POA: Diagnosis not present

## 2015-08-05 DIAGNOSIS — E538 Deficiency of other specified B group vitamins: Secondary | ICD-10-CM

## 2015-08-05 DIAGNOSIS — E785 Hyperlipidemia, unspecified: Secondary | ICD-10-CM | POA: Diagnosis not present

## 2015-08-05 DIAGNOSIS — N189 Chronic kidney disease, unspecified: Secondary | ICD-10-CM | POA: Diagnosis not present

## 2015-08-05 DIAGNOSIS — D649 Anemia, unspecified: Secondary | ICD-10-CM

## 2015-08-06 DIAGNOSIS — F0390 Unspecified dementia without behavioral disturbance: Secondary | ICD-10-CM | POA: Diagnosis not present

## 2015-08-06 DIAGNOSIS — J449 Chronic obstructive pulmonary disease, unspecified: Secondary | ICD-10-CM | POA: Diagnosis not present

## 2015-08-06 DIAGNOSIS — N189 Chronic kidney disease, unspecified: Secondary | ICD-10-CM | POA: Diagnosis not present

## 2015-08-06 DIAGNOSIS — I129 Hypertensive chronic kidney disease with stage 1 through stage 4 chronic kidney disease, or unspecified chronic kidney disease: Secondary | ICD-10-CM | POA: Diagnosis not present

## 2015-08-06 DIAGNOSIS — F329 Major depressive disorder, single episode, unspecified: Secondary | ICD-10-CM | POA: Diagnosis not present

## 2015-08-06 DIAGNOSIS — G9340 Encephalopathy, unspecified: Secondary | ICD-10-CM | POA: Diagnosis not present

## 2015-08-06 LAB — CMP14+EGFR
ALT: 7 IU/L (ref 0–32)
AST: 9 IU/L (ref 0–40)
Albumin/Globulin Ratio: 1.9 (ref 1.1–2.5)
Albumin: 4.4 g/dL (ref 3.6–4.8)
Alkaline Phosphatase: 112 IU/L (ref 39–117)
BUN/Creatinine Ratio: 20 (ref 11–26)
BUN: 26 mg/dL (ref 8–27)
Bilirubin Total: <0.2 mg/dL (ref 0.0–1.2)
CO2: 23 mmol/L (ref 18–29)
Calcium: 9.2 mg/dL (ref 8.7–10.3)
Chloride: 101 mmol/L (ref 97–108)
Creatinine, Ser: 1.29 mg/dL — ABNORMAL HIGH (ref 0.57–1.00)
GFR calc Af Amer: 50 mL/min/{1.73_m2} — ABNORMAL LOW (ref >59)
GFR calc non Af Amer: 43 mL/min/{1.73_m2} — ABNORMAL LOW (ref >59)
Globulin, Total: 2.3 g/dL (ref 1.5–4.5)
Glucose: 92 mg/dL (ref 65–99)
Potassium: 4.6 mmol/L (ref 3.5–5.2)
Sodium: 141 mmol/L (ref 134–144)
Total Protein: 6.7 g/dL (ref 6.0–8.5)

## 2015-08-06 LAB — LIPID PANEL
Chol/HDL Ratio: 4.4 ratio units (ref 0.0–4.4)
Cholesterol, Total: 210 mg/dL — ABNORMAL HIGH (ref 100–199)
HDL: 48 mg/dL (ref >39)
LDL Calculated: 146 mg/dL — ABNORMAL HIGH (ref 0–99)
Triglycerides: 81 mg/dL (ref 0–149)
VLDL Cholesterol Cal: 16 mg/dL (ref 5–40)

## 2015-08-09 DIAGNOSIS — I129 Hypertensive chronic kidney disease with stage 1 through stage 4 chronic kidney disease, or unspecified chronic kidney disease: Secondary | ICD-10-CM | POA: Diagnosis not present

## 2015-08-09 DIAGNOSIS — J449 Chronic obstructive pulmonary disease, unspecified: Secondary | ICD-10-CM | POA: Diagnosis not present

## 2015-08-09 DIAGNOSIS — F0390 Unspecified dementia without behavioral disturbance: Secondary | ICD-10-CM | POA: Diagnosis not present

## 2015-08-09 DIAGNOSIS — F329 Major depressive disorder, single episode, unspecified: Secondary | ICD-10-CM | POA: Diagnosis not present

## 2015-08-09 DIAGNOSIS — G9340 Encephalopathy, unspecified: Secondary | ICD-10-CM | POA: Diagnosis not present

## 2015-08-09 DIAGNOSIS — N189 Chronic kidney disease, unspecified: Secondary | ICD-10-CM | POA: Diagnosis not present

## 2015-08-10 ENCOUNTER — Ambulatory Visit (INDEPENDENT_AMBULATORY_CARE_PROVIDER_SITE_OTHER): Payer: Medicare Other

## 2015-08-10 ENCOUNTER — Telehealth: Payer: Self-pay | Admitting: Family Medicine

## 2015-08-10 ENCOUNTER — Ambulatory Visit (INDEPENDENT_AMBULATORY_CARE_PROVIDER_SITE_OTHER): Payer: Medicare Other | Admitting: Family Medicine

## 2015-08-10 ENCOUNTER — Encounter: Payer: Self-pay | Admitting: Family Medicine

## 2015-08-10 VITALS — BP 126/77 | HR 112 | Temp 97.5°F | Ht 68.0 in | Wt 163.0 lb

## 2015-08-10 DIAGNOSIS — M25562 Pain in left knee: Secondary | ICD-10-CM

## 2015-08-10 DIAGNOSIS — G8929 Other chronic pain: Secondary | ICD-10-CM

## 2015-08-10 DIAGNOSIS — B373 Candidiasis of vulva and vagina: Secondary | ICD-10-CM | POA: Diagnosis not present

## 2015-08-10 DIAGNOSIS — I10 Essential (primary) hypertension: Secondary | ICD-10-CM

## 2015-08-10 DIAGNOSIS — N898 Other specified noninflammatory disorders of vagina: Secondary | ICD-10-CM

## 2015-08-10 DIAGNOSIS — E785 Hyperlipidemia, unspecified: Secondary | ICD-10-CM | POA: Diagnosis not present

## 2015-08-10 DIAGNOSIS — B3731 Acute candidiasis of vulva and vagina: Secondary | ICD-10-CM

## 2015-08-10 DIAGNOSIS — Z780 Asymptomatic menopausal state: Secondary | ICD-10-CM | POA: Diagnosis not present

## 2015-08-10 DIAGNOSIS — E039 Hypothyroidism, unspecified: Secondary | ICD-10-CM | POA: Diagnosis not present

## 2015-08-10 DIAGNOSIS — F1020 Alcohol dependence, uncomplicated: Secondary | ICD-10-CM

## 2015-08-10 DIAGNOSIS — F1021 Alcohol dependence, in remission: Secondary | ICD-10-CM

## 2015-08-10 LAB — POCT WET PREP WITH KOH
KOH Prep POC: POSITIVE
Trichomonas, UA: NEGATIVE

## 2015-08-10 LAB — POCT UA - MICROSCOPIC ONLY
Casts, Ur, LPF, POC: NEGATIVE
Crystals, Ur, HPF, POC: NEGATIVE
Mucus, UA: NEGATIVE
RBC, urine, microscopic: NEGATIVE

## 2015-08-10 LAB — POCT URINALYSIS DIPSTICK
Bilirubin, UA: NEGATIVE
Blood, UA: NEGATIVE
Glucose, UA: NEGATIVE
Ketones, UA: NEGATIVE
Nitrite, UA: NEGATIVE
Protein, UA: NEGATIVE
Spec Grav, UA: 1.01
Urobilinogen, UA: NEGATIVE
pH, UA: 6

## 2015-08-10 MED ORDER — VALSARTAN 160 MG PO TABS: 160.0000 mg | ORAL_TABLET | Freq: Every day | ORAL | Status: DC

## 2015-08-10 MED ORDER — DICLOFENAC SODIUM 75 MG PO TBEC: 75.0000 mg | DELAYED_RELEASE_TABLET | Freq: Two times a day (BID) | ORAL | Status: DC

## 2015-08-10 MED ORDER — FLUCONAZOLE 150 MG PO TABS: 150.0000 mg | ORAL_TABLET | Freq: Once | ORAL | Status: DC

## 2015-08-10 NOTE — Patient Instructions (Signed)
Fat and Cholesterol Control Diet Fat and cholesterol levels in your blood and organs are influenced by your diet. High levels of fat and cholesterol may lead to diseases of the heart, small and large blood vessels, gallbladder, liver, and pancreas. CONTROLLING FAT AND CHOLESTEROL WITH DIET Although exercise and lifestyle factors are important, your diet is key. That is because certain foods are known to raise cholesterol and others to lower it. The goal is to balance foods for their effect on cholesterol and more importantly, to replace saturated and trans fat with other types of fat, such as monounsaturated fat, polyunsaturated fat, and omega-3 fatty acids. On average, a person should consume no more than 15 to 17 g of saturated fat daily. Saturated and trans fats are considered "bad" fats, and they will raise LDL cholesterol. Saturated fats are primarily found in animal products such as meats, butter, and cream. However, that does not mean you need to give up all your favorite foods. Today, there are good tasting, low-fat, low-cholesterol substitutes for most of the things you like to eat. Choose low-fat or nonfat alternatives. Choose round or loin cuts of red meat. These types of cuts are lowest in fat and cholesterol. Chicken (without the skin), fish, veal, and ground turkey breast are great choices. Eliminate fatty meats, such as hot dogs and salami. Even shellfish have little or no saturated fat. Have a 3 oz (85 g) portion when you eat lean meat, poultry, or fish. Trans fats are also called "partially hydrogenated oils." They are oils that have been scientifically manipulated so that they are solid at room temperature resulting in a longer shelf life and improved taste and texture of foods in which they are added. Trans fats are found in stick margarine, some tub margarines, cookies, crackers, and baked goods.  When baking and cooking, oils are a great substitute for butter. The monounsaturated oils are  especially beneficial since it is believed they lower LDL and raise HDL. The oils you should avoid entirely are saturated tropical oils, such as coconut and palm.  Remember to eat a lot from food groups that are naturally free of saturated and trans fat, including fish, fruit, vegetables, beans, grains (barley, rice, couscous, bulgur wheat), and pasta (without cream sauces).  IDENTIFYING FOODS THAT LOWER FAT AND CHOLESTEROL  Soluble fiber may lower your cholesterol. This type of fiber is found in fruits such as apples, vegetables such as broccoli, potatoes, and carrots, legumes such as beans, peas, and lentils, and grains such as barley. Foods fortified with plant sterols (phytosterol) may also lower cholesterol. You should eat at least 2 g per day of these foods for a cholesterol lowering effect.  Read package labels to identify low-saturated fats, trans fat free, and low-fat foods at the supermarket. Select cheeses that have only 2 to 3 g saturated fat per ounce. Use a heart-healthy tub margarine that is free of trans fats or partially hydrogenated oil. When buying baked goods (cookies, crackers), avoid partially hydrogenated oils. Breads and muffins should be made from whole grains (whole-wheat or whole oat flour, instead of "flour" or "enriched flour"). Buy non-creamy canned soups with reduced salt and no added fats.  FOOD PREPARATION TECHNIQUES  Never deep-fry. If you must fry, either stir-fry, which uses very little fat, or use non-stick cooking sprays. When possible, broil, bake, or roast meats, and steam vegetables. Instead of putting butter or margarine on vegetables, use lemon and herbs, applesauce, and cinnamon (for squash and sweet potatoes). Use nonfat   yogurt, salsa, and low-fat dressings for salads.  LOW-SATURATED FAT / LOW-FAT FOOD SUBSTITUTES Meats / Saturated Fat (g)  Avoid: Steak, marbled (3 oz/85 g) / 11 g  Choose: Steak, lean (3 oz/85 g) / 4 g  Avoid: Hamburger (3 oz/85 g) / 7  g  Choose: Hamburger, lean (3 oz/85 g) / 5 g  Avoid: Ham (3 oz/85 g) / 6 g  Choose: Ham, lean cut (3 oz/85 g) / 2.4 g  Avoid: Chicken, with skin, dark meat (3 oz/85 g) / 4 g  Choose: Chicken, skin removed, dark meat (3 oz/85 g) / 2 g  Avoid: Chicken, with skin, light meat (3 oz/85 g) / 2.5 g  Choose: Chicken, skin removed, light meat (3 oz/85 g) / 1 g Dairy / Saturated Fat (g)  Avoid: Whole milk (1 cup) / 5 g  Choose: Low-fat milk, 2% (1 cup) / 3 g  Choose: Low-fat milk, 1% (1 cup) / 1.5 g  Choose: Skim milk (1 cup) / 0.3 g  Avoid: Hard cheese (1 oz/28 g) / 6 g  Choose: Skim milk cheese (1 oz/28 g) / 2 to 3 g  Avoid: Cottage cheese, 4% fat (1 cup) / 6.5 g  Choose: Low-fat cottage cheese, 1% fat (1 cup) / 1.5 g  Avoid: Ice cream (1 cup) / 9 g  Choose: Sherbet (1 cup) / 2.5 g  Choose: Nonfat frozen yogurt (1 cup) / 0.3 g  Choose: Frozen fruit bar / trace  Avoid: Whipped cream (1 tbs) / 3.5 g  Choose: Nondairy whipped topping (1 tbs) / 1 g Condiments / Saturated Fat (g)  Avoid: Mayonnaise (1 tbs) / 2 g  Choose: Low-fat mayonnaise (1 tbs) / 1 g  Avoid: Butter (1 tbs) / 7 g  Choose: Extra light margarine (1 tbs) / 1 g  Avoid: Coconut oil (1 tbs) / 11.8 g  Choose: Olive oil (1 tbs) / 1.8 g  Choose: Corn oil (1 tbs) / 1.7 g  Choose: Safflower oil (1 tbs) / 1.2 g  Choose: Sunflower oil (1 tbs) / 1.4 g  Choose: Soybean oil (1 tbs) / 2.4 g  Choose: Canola oil (1 tbs) / 1 g Document Released: 11/27/2005 Document Revised: 03/24/2013 Document Reviewed: 02/25/2014 [employer] Patient Information 2015 [employer]. This information is not intended to replace advice given to you by your health care provider. Make sure you discuss any questions you have with your health care provider.  

## 2015-08-10 NOTE — Progress Notes (Signed)
Subjective:  Patient ID: [name], female    DOB: [birth year]  Age: 69 y.o. MRN: [id number]  CC: Hypertension; Hyperlipidemia; Hypothyroidism; Urinary Tract Infection; and Knee Pain   HPI [name] presents for  follow-up of hypertension. Patient has no history of headache chest pain or shortness of breath or recent cough. Patient also denies symptoms of TIA such as numbness weakness lateralizing. Patient checks  blood pressure at home and has not had any elevated readings recently. Patient denies side effects from his medication. States taking it regularly.  Patient also  in for follow-up of elevated cholesterol. Doing well without complaints on current medication. Denies side effects of statin including myalgia and arthralgia and nausea. Also in today for liver function testing. Currently no chest pain, shortness of breath or other cardiovascular related symptoms noted.  Patient presents for follow-up on  thyroid. She has a history of hypothyroidism for many years. It has been undertreated recently. Pt. denies any change in  voice, loss of hair, heat or cold intolerance. Energy level has been adequate to good. She denies constipation and diarrhea. No myxedema. Medication is as noted below. Verified that pt is taking it daily on an empty stomach. Well tolerated.  Left knee pain increasing for several weeks. She received physical therapy at [facility] while recuperating. It helped some but pain continues. It is moderate in severity. She does use a cane. She has asked for a handicap placard.  She has not had a drink since her illness began 4 months ago. 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  History [name] has a past medical history of Hypertension; Menopause; Hypothyroid; Mini stroke; Hyperlipidemia; B12 deficiency; Shortness of breath; Headache(784.0); Anxiety; Pneumonia;  Anemia; Depression; Stroke (2010); Alcoholism (01/09/2012); Hyponatremia (01/09/2012); Chronic kidney disease (CKD), stage II (mild); COPD (chronic obstructive pulmonary disease); Orthostatic syncope (10/02/2012); and Carotid artery narrowing (10/03/2012).   She has past surgical history that includes Appendectomy; Tonsillectomy; Cataract extraction w/PHACO (06/20/2012); and Cataract extraction w/PHACO (07/11/2012).   Her family history includes GER disease in her mother; Hypertension in her maternal grandmother.She reports that she quit smoking about 23 years ago. Her smoking use included Cigarettes. She has a 60 pack-year smoking history. She does not have any smokeless tobacco history on file. She reports that she drinks about 7.0 oz of alcohol per week. She reports that she does not use illicit drugs.  Current Outpatient Prescriptions on File Prior to Visit  Medication Sig Dispense Refill  . acetaminophen (TYLENOL) 500 MG tablet Take 1 tablet (500 mg total) by mouth every 6 (six) hours as needed for moderate pain. Pain 90 tablet 4  . allopurinol (ZYLOPRIM) 300 MG tablet Take 1 tablet (300 mg total) by mouth daily. 30 tablet 11  . aspirin EC 81 MG tablet Take 81 mg by mouth daily.    . DULoxetine (CYMBALTA) 60 MG capsule Take 1 capsule (60 mg total) by mouth daily. 90 capsule 3  . folic acid (FOLVITE) 1 MG tablet Take 1 tablet (1 mg total) by mouth daily. 90 tablet 3  . thiamine (VITAMIN B-1) 100 MG tablet Take 1 tablet (100 mg total) by mouth daily. 90 tablet 3  . indomethacin (INDOCIN SR) 75 MG CR capsule Take 1 capsule (75 mg total) by mouth daily as needed (for gout). (Patient not taking: Reported on 07/12/2015) 60 capsule 5   No  current facility-administered medications on file prior to visit.    ROS Review of Systems  Constitutional: Negative for fever, chills, diaphoresis, appetite change, fatigue and unexpected weight change.  HENT: Negative for congestion, ear pain, hearing loss, postnasal  drip, rhinorrhea, sneezing, sore throat and trouble swallowing.   Eyes: Negative for pain.  Respiratory: Negative for cough, chest tightness and shortness of breath.   Cardiovascular: Negative for chest pain and palpitations.  Gastrointestinal: Negative for nausea, vomiting, abdominal pain, diarrhea and constipation.  Genitourinary: Positive for dysuria and vaginal discharge (with itching. She recently finished an antibiotic). Negative for frequency and menstrual problem.  Musculoskeletal: Negative for joint swelling and arthralgias.  Skin: Negative for rash.  Neurological: Negative for dizziness, weakness, numbness and headaches.  Psychiatric/Behavioral: Negative for behavioral problems, confusion, dysphoric mood and agitation. The patient is not nervous/anxious and is not hyperactive.        Recovering alcoholism    Objective:  BP 126/77 mmHg  Pulse 112  Temp(Src) 97.5 F (36.4 C) (Oral)  Ht 5\' 8"  (1.727 m)  Wt 163 lb (73.936 kg)  BMI 24.79 kg/m2  LMP 08/21/2013  BP Readings from Last 3 Encounters:  08/10/15 126/77  07/12/15 158/85  04/30/15 196/103    Wt Readings from Last 3 Encounters:  08/10/15 163 lb (73.936 kg)  07/12/15 163 lb 9.6 oz (74.208 kg)  04/30/15 158 lb (71.668 kg)     Physical Exam  Lab Results  Component Value Date   HGBA1C 4.7 11/13/2013   HGBA1C 5.0 10/22/2013    Lab Results  Component Value Date   WBC 4.2 04/28/2015   HGB 9.2* 04/28/2015   HCT 29.6* 04/28/2015   PLT 171 04/28/2015   GLUCOSE 92 08/05/2015   CHOL 210* 08/05/2015   TRIG 81 08/05/2015   HDL 48 08/05/2015   LDLCALC 146* 08/05/2015   ALT 7 08/05/2015   AST 9 08/05/2015   NA 141 08/05/2015   K 4.6 08/05/2015   CL 101 08/05/2015   CREATININE 1.29* 08/05/2015   BUN 26 08/05/2015   CO2 23 08/05/2015   TSH 74.010* 07/12/2015   INR 1.07 04/28/2015   HGBA1C 4.7 11/13/2013   Results for orders placed or performed in visit on 08/10/15  POCT urinalysis dipstick  Result Value  Ref Range   Color, UA yellow    Clarity, UA cloudy    Glucose, UA neg    Bilirubin, UA neg    Ketones, UA neg    Spec Grav, UA 1.010    Blood, UA neg    pH, UA 6.0    Protein, UA neg    Urobilinogen, UA negative    Nitrite, UA neg    Leukocytes, UA moderate (2+) (A) Negative  POCT UA - Microscopic Only  Result Value Ref Range   WBC, Ur, HPF, POC 5-10    RBC, urine, microscopic neg    Bacteria, U Microscopic rare    Mucus, UA neg    Epithelial cells, urine per micros occ    Crystals, Ur, HPF, POC neg    Casts, Ur, LPF, POC neg    Yeast, UA occ   POCT Wet Prep with KOH  Result Value Ref Range   Trichomonas, UA Negative    Clue Cells Wet Prep HPF POC occ    Epithelial Wet Prep HPF POC Many Few, Moderate, Many   Yeast Wet Prep HPF POC mod    Bacteria Wet Prep HPF POC Moderate (A) None, Few   RBC Wet  Prep HPF POC 1-5    WBC Wet Prep HPF POC 20-25    KOH Prep POC Positive      Ct Head Wo Contrast  04/30/2015   CLINICAL DATA:  Slipped out of wheelchair and hit head. Altered level of consciousness. History of hypertension, stroke, alcoholism, headache.  EXAM: CT HEAD WITHOUT CONTRAST  CT CERVICAL SPINE WITHOUT CONTRAST  TECHNIQUE: Multidetector CT imaging of the head and cervical spine was performed following the standard protocol without intravenous contrast. Multiplanar CT image reconstructions of the cervical spine were also generated.  COMPARISON:  MRI of the head Apr 28, 2015  FINDINGS: CT HEAD FINDINGS  Mildly motion degraded examination.  Moderate to severe ventriculomegaly, predominately on the basis of global parenchymal brain volume loss though there is mild sulcal effacement at the convexities. Confluent supratentorial white matter hypodensities corresponding to chronic small vessel ischemic disease better seen on prior MRI. No midline shift, mass effect, acute large vascular territory infarct nor intraparenchymal hemorrhage.  No abnormal extra-axial fluid collections. Basal  cisterns are patent. Mild calcific atherosclerosis the carotid siphons.  Bilateral ocular lens implants. 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 Visualized paranasal sinuses and mastoid air cells are well aerated. Moderate RIGHT frontal scalp hematoma without subcutaneous gas or radiopaque foreign bodies. No skull fracture.  CT CERVICAL SPINE FINDINGS  Cervical vertebral bodies and posterior elements are intact. Straightened cervical lordosis. RIGHT C3-4 facets are fused on degenerative basis. Grade 1 C7-T1 anterolisthesis without spondylolysis. Severe C5-6, moderate to severe C6-7 and C7-T1 disc height loss, endplate sclerosis of marginal spurring consistent with degenerative disc. Moderate to severe upper cervical facet arthropathy. Moderate to severe RIGHT C3-4, moderate to severe LEFT C5-6 neural foraminal narrowing. No destructive bony lesions. Included prevertebral and paraspinal soft tissues are nonsuspicious.  IMPRESSION: CT HEAD: No acute intracranial process. Moderate RIGHT frontal scalp hematoma. No skull fracture.  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 Moderate to severe white matter changes compatible chronic small vessel ischemic disease.  CT CERVICAL SPINE: Straightened cervical lordosis without acute fracture. Grade 1 C7-T1 anterolisthesis on degenerative basis.   Electronically Signed   By: Elon Alas   On: 04/30/2015 04:40   Ct Cervical Spine Wo Contrast  04/30/2015   CLINICAL DATA:  Slipped out of wheelchair and hit head. Altered level of consciousness. History of hypertension, stroke, alcoholism, headache.  EXAM: CT HEAD WITHOUT CONTRAST  CT CERVICAL SPINE WITHOUT CONTRAST  TECHNIQUE: Multidetector CT imaging of the head and cervical spine was performed following the standard protocol without intravenous contrast. Multiplanar CT image reconstructions of the cervical spine were also generated.  COMPARISON:   MRI of the head Apr 28, 2015  FINDINGS: CT HEAD FINDINGS  Mildly motion degraded examination.  Moderate to severe ventriculomegaly, predominately on the basis of global parenchymal brain volume loss though there is mild sulcal effacement at the convexities. Confluent supratentorial white matter hypodensities corresponding to chronic small vessel ischemic disease better seen on prior MRI. No midline shift, mass effect, acute large vascular territory infarct nor intraparenchymal hemorrhage.  No abnormal extra-axial fluid collections. Basal cisterns are patent. Mild calcific atherosclerosis the carotid siphons.  Bilateral ocular lens implants. 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 Visualized paranasal sinuses and mastoid air cells are well aerated. Moderate RIGHT frontal scalp hematoma without subcutaneous gas or radiopaque foreign bodies. No skull fracture.  CT CERVICAL SPINE FINDINGS  Cervical vertebral bodies and posterior elements are intact. Straightened cervical lordosis. RIGHT C3-4  facets are fused on degenerative basis. Grade 1 C7-T1 anterolisthesis without spondylolysis. Severe C5-6, moderate to severe C6-7 and C7-T1 disc height loss, endplate sclerosis of marginal spurring consistent with degenerative disc. Moderate to severe upper cervical facet arthropathy. Moderate to severe RIGHT C3-4, moderate to severe LEFT C5-6 neural foraminal narrowing. No destructive bony lesions. Included prevertebral and paraspinal soft tissues are nonsuspicious.  IMPRESSION: CT HEAD: No acute intracranial process. Moderate RIGHT frontal scalp hematoma. No skull fracture.  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 Moderate to severe white matter changes compatible chronic small vessel ischemic disease.  CT CERVICAL SPINE: Straightened cervical lordosis without acute fracture. Grade 1 C7-T1 anterolisthesis on degenerative basis.   Electronically Signed    By: Elon Alas   On: 04/30/2015 04:40    Assessment & Plan:   Janyla was seen today for hypertension, hyperlipidemia, hypothyroidism, urinary tract infection and knee pain.  Diagnoses and all orders for this visit:  Knee pain, chronic, left -     diclofenac (VOLTAREN) 75 MG EC tablet; Take 1 tablet (75 mg total) by mouth 2 (two) times daily. -     DG Knee 1-2 Views Left; Future -     MR Knee Left  Wo Contrast; Future  Postmenopausal -     DG Bone Density -     Urine culture  Vaginal discharge  Alcoholism in recovery  Essential hypertension -     valsartan (DIOVAN) 160 MG tablet; Take 1 tablet (160 mg total) by mouth daily. 1 tablet daily x 1 wk Then 2 tablets daily  Hyperlipidemia  Hypothyroidism, unspecified hypothyroidism type  Vaginitis due to Candida -     POCT urinalysis dipstick -     POCT UA - Microscopic Only -     POCT Wet Prep with KOH -     Urine culture -     fluconazole (DIFLUCAN) 150 MG tablet; Take 1 tablet (150 mg total) by mouth once.  Other orders -     Discontinue: valsartan (DIOVAN) 160 MG tablet; Take 1 tablet (160 mg total) by mouth daily. 1 tablet daily x 1 wk Then 2 tablets daily   I have discontinued Ms. Price's budesonide-formoterol, levothyroxine, and ciprofloxacin. I am also having her start on diclofenac and fluconazole. Additionally, I am having her maintain her allopurinol, indomethacin, aspirin EC, acetaminophen, DULoxetine, folic acid, thiamine, and valsartan.  Meds ordered this encounter  Medications  . DISCONTD: valsartan (DIOVAN) 160 MG tablet    Sig: Take 1 tablet (160 mg total) by mouth daily. 1 tablet daily x 1 wk Then 2 tablets daily    Dispense:  60 tablet    Refill:  5  . diclofenac (VOLTAREN) 75 MG EC tablet    Sig: Take 1 tablet (75 mg total) by mouth 2 (two) times daily.    Dispense:  60 tablet    Refill:  2  . fluconazole (DIFLUCAN) 150 MG tablet    Sig: Take 1 tablet (150 mg total) by mouth once.     Dispense:  1 tablet    Refill:  0  . valsartan (DIOVAN) 160 MG tablet    Sig: Take 1 tablet (160 mg total) by mouth daily. 1 tablet daily x 1 wk Then 2 tablets daily    Dispense:  60 tablet    Refill:  5   Patient was encouraged to continue with her alcohol recovery. She needs to be attending AA meetings daily.  I congratulated her on moving forward with volunteering. We discussed low-salt diet. Low-fat diet handout given.  Knee x-ray shows evidence for arthritis.  Wet prep confirms yeast vaginitis.  Labs reviewed regarding her thyroid showing recent dramatically elevated TSH. Patient's dose was increased at that time but she just recently started the new dose. Her energy is already better. It is too soon to recheck a TSH  Follow-up: Return in about 1 month (around 09/10/2015).  Claretta Fraise, M.D.

## 2015-08-11 ENCOUNTER — Ambulatory Visit (INDEPENDENT_AMBULATORY_CARE_PROVIDER_SITE_OTHER): Payer: Medicare Other | Admitting: Family Medicine

## 2015-08-11 DIAGNOSIS — I129 Hypertensive chronic kidney disease with stage 1 through stage 4 chronic kidney disease, or unspecified chronic kidney disease: Secondary | ICD-10-CM

## 2015-08-11 DIAGNOSIS — N189 Chronic kidney disease, unspecified: Secondary | ICD-10-CM | POA: Diagnosis not present

## 2015-08-11 DIAGNOSIS — J449 Chronic obstructive pulmonary disease, unspecified: Secondary | ICD-10-CM

## 2015-08-11 DIAGNOSIS — G9340 Encephalopathy, unspecified: Secondary | ICD-10-CM

## 2015-08-11 DIAGNOSIS — F0390 Unspecified dementia without behavioral disturbance: Secondary | ICD-10-CM | POA: Diagnosis not present

## 2015-08-11 DIAGNOSIS — F329 Major depressive disorder, single episode, unspecified: Secondary | ICD-10-CM | POA: Diagnosis not present

## 2015-08-12 LAB — URINE CULTURE

## 2015-08-24 ENCOUNTER — Ambulatory Visit (HOSPITAL_COMMUNITY)
Admission: RE | Admit: 2015-08-24 | Discharge: 2015-08-24 | Disposition: A | Discharge status: Home / Self Care | Payer: Medicare Other | Source: Ambulatory Visit | Attending: Family Medicine | Admitting: Family Medicine

## 2015-08-24 DIAGNOSIS — G8929 Other chronic pain: Secondary | ICD-10-CM | POA: Diagnosis not present

## 2015-08-24 DIAGNOSIS — M25562 Pain in left knee: Secondary | ICD-10-CM | POA: Insufficient documentation

## 2015-08-25 ENCOUNTER — Other Ambulatory Visit: Payer: Self-pay | Admitting: *Deleted

## 2015-08-25 DIAGNOSIS — S83207A Unspecified tear of unspecified meniscus, current injury, left knee, initial encounter: Secondary | ICD-10-CM

## 2015-09-02 DIAGNOSIS — M25562 Pain in left knee: Secondary | ICD-10-CM | POA: Diagnosis not present

## 2015-09-02 DIAGNOSIS — G8929 Other chronic pain: Secondary | ICD-10-CM | POA: Diagnosis not present

## 2015-09-08 ENCOUNTER — Other Ambulatory Visit (INDEPENDENT_AMBULATORY_CARE_PROVIDER_SITE_OTHER): Payer: Medicare Other

## 2015-09-08 DIAGNOSIS — R799 Abnormal finding of blood chemistry, unspecified: Secondary | ICD-10-CM

## 2015-09-08 DIAGNOSIS — R5383 Other fatigue: Secondary | ICD-10-CM | POA: Diagnosis not present

## 2015-09-09 LAB — THYROID PANEL WITH TSH
Free Thyroxine Index: 1.8 (ref 1.2–4.9)
T3 Uptake Ratio: 25 % (ref 24–39)
T4, Total: 7 ug/dL (ref 4.5–12.0)
TSH: 28.85 u[IU]/mL — ABNORMAL HIGH (ref 0.450–4.500)

## 2015-09-09 NOTE — Progress Notes (Signed)
Subjective:  Patient ID: [name], female    DOB: Oct 31, 1946  Age: 69 y.o. MRN: [id number]  CC: Hypertension; Hyperlipidemia; Chronic Kidney Disease; Urinary Tract Infection; and Hypothyroidism   HPI [name] presents for  follow-up of hypertension. Patient has no history of headache chest pain or shortness of breath or recent cough. Patient also denies symptoms of TIA such as numbness weakness lateralizing. Patient checks  blood pressure at home and has not had any elevated readings recently. Patient denies side effects from his medication. States taking it regularly.  Patient recently submitted specimen for evaluation for cholesterol. Those results are attached. Elevation of cholesterol noted to be mild. Moderate elevation of LDL.  Patient presents for evaluation of thyroid. \. Pt. denies any change in  voice, loss of hair, heat or cold intolerance. Energy level has been adequate to good. She denies constipation and diarrhea. No myxedema.   Having some burning with urination and vaginal DC. ONgoing occasionally for >1 month.  History [name] has a past medical history of Hypertension; Menopause; Hypothyroid; Mini stroke; Hyperlipidemia; B12 deficiency; Shortness of breath; Headache(784.0); Anxiety; Pneumonia; Anemia; Depression; Stroke (2010); Alcoholism (01/09/2012); Hyponatremia (01/09/2012); Chronic kidney disease (CKD), stage II (mild); COPD (chronic obstructive pulmonary disease); Orthostatic syncope (10/02/2012); and Carotid artery narrowing (10/03/2012).   She has past surgical history that includes Appendectomy; Tonsillectomy; Cataract extraction w/PHACO (06/20/2012); and Cataract extraction w/PHACO (07/11/2012).   Her family history includes GER disease in her mother; Hypertension in her maternal grandmother.She reports that she quit smoking about 23 years ago. Her smoking use included Cigarettes. She has a 60 pack-year smoking history. She does not have any smokeless tobacco  history on file. She reports that she drinks about 7.0 oz of alcohol per week. She reports that she does not use illicit drugs.  Current Outpatient Prescriptions on File Prior to Visit  Medication Sig Dispense Refill  . acetaminophen (TYLENOL) 500 MG tablet Take 1 tablet (500 mg total) by mouth every 6 (six) hours as needed for moderate pain. Pain 90 tablet 4  . allopurinol (ZYLOPRIM) 300 MG tablet Take 1 tablet (300 mg total) by mouth daily. 30 tablet 11  . aspirin EC 81 MG tablet Take 81 mg by mouth daily.    . diclofenac (VOLTAREN) 75 MG EC tablet Take 1 tablet (75 mg total) by mouth 2 (two) times daily. 60 tablet 2  . DULoxetine (CYMBALTA) 60 MG capsule Take 1 capsule (60 mg total) by mouth daily. 90 capsule 3  . folic acid (FOLVITE) 1 MG tablet Take 1 tablet (1 mg total) by mouth daily. 90 tablet 3  . indomethacin (INDOCIN SR) 75 MG CR capsule Take 1 capsule (75 mg total) by mouth daily as needed (for gout). 60 capsule 5  . thiamine (VITAMIN B-1) 100 MG tablet Take 1 tablet (100 mg total) by mouth daily. 90 tablet 3  . valsartan (DIOVAN) 160 MG tablet Take 1 tablet (160 mg total) by mouth daily. 1 tablet daily x 1 wk Then 2 tablets daily 60 tablet 5   No current facility-administered medications on file prior to visit.    ROS Review of Systems  Constitutional: Negative for fever, chills, diaphoresis, appetite change, fatigue and unexpected weight change.  HENT: Negative for congestion, ear pain, hearing loss, postnasal drip, rhinorrhea, sneezing, sore throat and trouble swallowing.   Eyes: Negative for pain.  Respiratory: Negative for cough, chest tightness and shortness of breath.   Cardiovascular: Negative for chest pain and palpitations.  Gastrointestinal: Positive for diarrhea (constant, watery for 2 days earlier this week.). Negative for nausea, vomiting, abdominal pain and constipation.  Genitourinary: Negative for dysuria, frequency and menstrual problem.  Musculoskeletal:  Negative for joint swelling and arthralgias.  Skin: Negative for rash.  Neurological: Negative for dizziness, weakness, numbness and headaches.  Psychiatric/Behavioral: Negative for dysphoric mood and agitation.    Objective:  BP 108/65 mmHg  Pulse 92  Temp(Src) 97.8 F (36.6 C) (Oral)  Ht 5\' 8"  (1.727 m)  Wt 160 lb 12.8 oz (72.938 kg)  BMI 24.46 kg/m2  LMP 08/21/2013  BP Readings from Last 3 Encounters:  09/10/15 108/65  08/10/15 126/77  07/12/15 158/85    Wt Readings from Last 3 Encounters:  09/10/15 160 lb 12.8 oz (72.938 kg)  08/10/15 163 lb (73.936 kg)  07/12/15 163 lb 9.6 oz (74.208 kg)     Physical Exam  Constitutional: She is oriented to person, place, and time. She appears well-developed and well-nourished. No distress.  HENT:  Head: Normocephalic and atraumatic.  Right Ear: External ear normal.  Left Ear: External ear normal.  Nose: Nose normal.  Mouth/Throat: Oropharynx is clear and moist.  Eyes: Conjunctivae and EOM are normal. Pupils are equal, round, and reactive to light.  Neck: Normal range of motion. Neck supple. No thyromegaly present.  Cardiovascular: Normal rate, regular rhythm and normal heart sounds.   No murmur heard. Pulmonary/Chest: Effort normal and breath sounds normal. No respiratory distress. She has no wheezes. She has no rales.  Abdominal: Soft. Bowel sounds are normal. She exhibits no distension. There is no tenderness.  Lymphadenopathy:    She has no cervical adenopathy.  Neurological: She is alert and oriented to person, place, and time. She has normal reflexes.  Skin: Skin is warm and dry.  Psychiatric: She has a normal mood and affect. Her behavior is normal. Judgment and thought content normal.    Lab Results  Component Value Date   HGBA1C 4.7 11/13/2013   HGBA1C 5.0 10/22/2013    Lab Results  Component Value Date   WBC 4.2 04/28/2015   HGB 9.2* 04/28/2015   HCT 29.6* 04/28/2015   PLT 171 04/28/2015   GLUCOSE 92  08/05/2015   CHOL 210* 08/05/2015   TRIG 81 08/05/2015   HDL 48 08/05/2015   LDLCALC 146* 08/05/2015   ALT 7 08/05/2015   AST 9 08/05/2015   NA 141 08/05/2015   K 4.6 08/05/2015   CL 101 08/05/2015   CREATININE 1.29* 08/05/2015   BUN 26 08/05/2015   CO2 23 08/05/2015   TSH 28.850* 09/08/2015   INR 1.07 04/28/2015   HGBA1C 4.7 11/13/2013    Mr Knee Left  Wo Contrast  08/24/2015   CLINICAL DATA:  Chronic left knee pain.  EXAM: MRI OF THE LEFT KNEE WITHOUT CONTRAST  TECHNIQUE: Multiplanar, multisequence MR imaging of the knee was performed. No intravenous contrast was administered.  COMPARISON:  Radiographs dated 08/10/2015  FINDINGS: MENISCI  Medial meniscus:  Normal.  Lateral meniscus: There is a complex tear of the undersurface of the subluxed midbody. The meniscus is subluxed into the inferior gutter. There is diffuse degeneration of the anterior horn without a discrete tear of the anterior horn.  LIGAMENTS  Cruciates:  Normal.  Collaterals: Chronic degenerative changes of the origin of the LCL. Normal MCL.  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  Medial: Slight irregularity of the articular cartilage of the central portion of the femoral condyle.  Lateral:  Extensive full-thickness cartilage loss of the tibial plateau and femoral condyle with grade 4 changes of the tibial plateau. There is multifocal grade 4 chondromalacia of the posterior aspect of the femoral condyle.  Joint:  Small joint effusion.  Popliteal Fossa:  Normal.  Extensor Mechanism:  Normal.  Bones: Tricompartmental marginal osteophytes, most prominent in the lateral compartment with sclerosis of the condyle and tibial plateau with marked medial joint space narrowing.  IMPRESSION: 1. 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 2. Extensive chondromalacia of the patella.   Electronically Signed   By: Lorriane Shire M.D.   On:  08/24/2015 13:09    Assessment & Plan:   Nkechi was seen today for hypertension, hyperlipidemia, chronic kidney disease, urinary tract infection and hypothyroidism.  Diagnoses and all orders for this visit:  Hyperlipidemia -     NMR, lipoprofile  Essential hypertension  Chronic kidney disease (CKD), stage II (mild)  Iron deficiency anemia -     CBC with Differential/Platelet  Hypothyroidism, unspecified hypothyroidism type  Dysuria -     POCT UA - Microscopic Only -     POCT urinalysis dipstick  Other orders -     levothyroxine (SYNTHROID, LEVOTHROID) 100 MCG tablet; Take 1 tablet (100 mcg total) by mouth daily. -     ciprofloxacin (CIPRO) 500 MG tablet; Take 1 tablet (500 mg total) by mouth 2 (two) times daily. -     fluconazole (DIFLUCAN) 150 MG tablet; Take 1 tablet (150 mg total) by mouth once. Repeat in one week   I have discontinued Ms. Price's fluconazole. I am also having her start on levothyroxine, ciprofloxacin, and fluconazole. Additionally, I am having her maintain her allopurinol, indomethacin, aspirin EC, acetaminophen, DULoxetine, folic acid, thiamine, diclofenac, and valsartan.  Meds ordered this encounter  Medications  . levothyroxine (SYNTHROID, LEVOTHROID) 100 MCG tablet    Sig: Take 1 tablet (100 mcg total) by mouth daily.    Dispense:  90 tablet    Refill:  3  . ciprofloxacin (CIPRO) 500 MG tablet    Sig: Take 1 tablet (500 mg total) by mouth 2 (two) times daily.    Dispense:  14 tablet    Refill:  0  . fluconazole (DIFLUCAN) 150 MG tablet    Sig: Take 1 tablet (150 mg total) by mouth once. Repeat in one week    Dispense:  2 tablet    Refill:  0     Follow-up: No Follow-up on file.  Claretta Fraise, M.D.

## 2015-09-10 ENCOUNTER — Ambulatory Visit (INDEPENDENT_AMBULATORY_CARE_PROVIDER_SITE_OTHER): Payer: Medicare Other | Admitting: Family Medicine

## 2015-09-10 ENCOUNTER — Encounter: Payer: Self-pay | Admitting: Family Medicine

## 2015-09-10 VITALS — BP 108/65 | HR 92 | Temp 97.8°F | Ht 68.0 in | Wt 160.8 lb

## 2015-09-10 DIAGNOSIS — E039 Hypothyroidism, unspecified: Secondary | ICD-10-CM

## 2015-09-10 DIAGNOSIS — Z23 Encounter for immunization: Secondary | ICD-10-CM

## 2015-09-10 DIAGNOSIS — E785 Hyperlipidemia, unspecified: Secondary | ICD-10-CM

## 2015-09-10 DIAGNOSIS — D509 Iron deficiency anemia, unspecified: Secondary | ICD-10-CM

## 2015-09-10 DIAGNOSIS — I10 Essential (primary) hypertension: Secondary | ICD-10-CM

## 2015-09-10 DIAGNOSIS — R300 Dysuria: Secondary | ICD-10-CM | POA: Diagnosis not present

## 2015-09-10 DIAGNOSIS — N182 Chronic kidney disease, stage 2 (mild): Secondary | ICD-10-CM | POA: Diagnosis not present

## 2015-09-10 LAB — POCT URINALYSIS DIPSTICK
Bilirubin, UA: NEGATIVE
Blood, UA: NEGATIVE
Glucose, UA: NEGATIVE
Ketones, UA: NEGATIVE
Leukocytes, UA: NEGATIVE
Nitrite, UA: NEGATIVE
Protein, UA: NEGATIVE
Spec Grav, UA: <=1.005
Urobilinogen, UA: NEGATIVE
pH, UA: 6

## 2015-09-10 LAB — POCT UA - MICROSCOPIC ONLY
Bacteria, U Microscopic: NEGATIVE
Casts, Ur, LPF, POC: NEGATIVE
Crystals, Ur, HPF, POC: NEGATIVE
Mucus, UA: NEGATIVE
RBC, urine, microscopic: NEGATIVE
Yeast, UA: NEGATIVE

## 2015-09-10 MED ORDER — LEVOTHYROXINE SODIUM 100 MCG PO TABS: 100.0000 ug | ORAL_TABLET | Freq: Every day | ORAL | Status: DC

## 2015-09-10 MED ORDER — CIPROFLOXACIN HCL 500 MG PO TABS: 500.0000 mg | ORAL_TABLET | Freq: Two times a day (BID) | ORAL | Status: DC

## 2015-09-10 MED ORDER — FLUCONAZOLE 150 MG PO TABS: 150.0000 mg | ORAL_TABLET | Freq: Once | ORAL | Status: DC

## 2015-09-10 NOTE — Patient Instructions (Signed)
Take either diclofenac or indomethacin for pain. Do not take them within 12 hours of each other   Fat and Cholesterol Control Diet Fat and cholesterol levels in your blood and organs are influenced by your diet. High levels of fat and cholesterol may lead to diseases of the heart, small and large blood vessels, gallbladder, liver, and pancreas. CONTROLLING FAT AND CHOLESTEROL WITH DIET Although exercise and lifestyle factors are important, your diet is key. That is because certain foods are known to raise cholesterol and others to lower it. The goal is to balance foods for their effect on cholesterol and more importantly, to replace saturated and trans fat with other types of fat, such as monounsaturated fat, polyunsaturated fat, and omega-3 fatty acids. On average, a person should consume no more than 15 to 17 g of saturated fat daily. Saturated and trans fats are considered "bad" fats, and they will raise LDL cholesterol. Saturated fats are primarily found in animal products such as meats, butter, and cream. However, that does not mean you need to give up all your favorite foods. Today, there are good tasting, low-fat, low-cholesterol substitutes for most of the things you like to eat. Choose low-fat or nonfat alternatives. Choose round or loin cuts of red meat. These types of cuts are lowest in fat and cholesterol. Chicken (without the skin), fish, veal, and ground [place] breast are great choices. Eliminate fatty meats, such as hot dogs and salami. Even shellfish have little or no saturated fat. Have a 3 oz (85 g) portion when you eat lean meat, poultry, or fish. Trans fats are also called "partially hydrogenated oils." They are oils that have been scientifically manipulated so that they are solid at room temperature resulting in a longer shelf life and improved taste and texture of foods in which they are added. Trans fats are found in stick margarine, some tub margarines, cookies, crackers, and baked  goods.  When baking and cooking, oils are a great substitute for butter. The monounsaturated oils are especially beneficial since it is believed they lower LDL and raise HDL. The oils you should avoid entirely are saturated tropical oils, such as coconut and palm.  Remember to eat a lot from food groups that are naturally free of saturated and trans fat, including fish, fruit, vegetables, beans, grains (barley, rice, couscous, bulgur wheat), and pasta (without cream sauces).  IDENTIFYING FOODS THAT LOWER FAT AND CHOLESTEROL  Soluble fiber may lower your cholesterol. This type of fiber is found in fruits such as apples, vegetables such as broccoli, potatoes, and carrots, legumes such as beans, peas, and lentils, and grains such as barley. Foods fortified with plant sterols (phytosterol) may also lower cholesterol. You should eat at least 2 g per day of these foods for a cholesterol lowering effect.  Read package labels to identify low-saturated fats, trans fat free, and low-fat foods at the supermarket. Select cheeses that have only 2 to 3 g saturated fat per ounce. Use a heart-healthy tub margarine that is free of trans fats or partially hydrogenated oil. When buying baked goods (cookies, crackers), avoid partially hydrogenated oils. Breads and muffins should be made from whole grains (whole-wheat or whole oat flour, instead of "flour" or "enriched flour"). Buy non-creamy canned soups with reduced salt and no added fats.  FOOD PREPARATION TECHNIQUES  Never deep-fry. If you must fry, either stir-fry, which uses very little fat, or use non-stick cooking sprays. When possible, broil, bake, or roast meats, and steam vegetables. Instead of putting  butter or margarine on vegetables, use lemon and herbs, applesauce, and cinnamon (for squash and sweet potatoes). Use nonfat yogurt, salsa, and low-fat dressings for salads.  LOW-SATURATED FAT / LOW-FAT FOOD SUBSTITUTES Meats / Saturated Fat (g)  Avoid: Steak,  marbled (3 oz/85 g) / 11 g  Choose: Steak, lean (3 oz/85 g) / 4 g  Avoid: Hamburger (3 oz/85 g) / 7 g  Choose: Hamburger, lean (3 oz/85 g) / 5 g  Avoid: Ham (3 oz/85 g) / 6 g  Choose: Ham, lean cut (3 oz/85 g) / 2.4 g  Avoid: Chicken, with skin, dark meat (3 oz/85 g) / 4 g  Choose: Chicken, skin removed, dark meat (3 oz/85 g) / 2 g  Avoid: Chicken, with skin, light meat (3 oz/85 g) / 2.5 g  Choose: Chicken, skin removed, light meat (3 oz/85 g) / 1 g Dairy / Saturated Fat (g)  Avoid: Whole milk (1 cup) / 5 g  Choose: Low-fat milk, 2% (1 cup) / 3 g  Choose: Low-fat milk, 1% (1 cup) / 1.5 g  Choose: Skim milk (1 cup) / 0.3 g  Avoid: Hard cheese (1 oz/28 g) / 6 g  Choose: Skim milk cheese (1 oz/28 g) / 2 to 3 g  Avoid: Cottage cheese, 4% fat (1 cup) / 6.5 g  Choose: Low-fat cottage cheese, 1% fat (1 cup) / 1.5 g  Avoid: Ice cream (1 cup) / 9 g  Choose: Sherbet (1 cup) / 2.5 g  Choose: Nonfat frozen yogurt (1 cup) / 0.3 g  Choose: Frozen fruit bar / trace  Avoid: Whipped cream (1 tbs) / 3.5 g  Choose: Nondairy whipped topping (1 tbs) / 1 g Condiments / Saturated Fat (g)  Avoid: Mayonnaise (1 tbs) / 2 g  Choose: Low-fat mayonnaise (1 tbs) / 1 g  Avoid: Butter (1 tbs) / 7 g  Choose: Extra light margarine (1 tbs) / 1 g  Avoid: Coconut oil (1 tbs) / 11.8 g  Choose: Olive oil (1 tbs) / 1.8 g  Choose: Corn oil (1 tbs) / 1.7 g  Choose: Safflower oil (1 tbs) / 1.2 g  Choose: Sunflower oil (1 tbs) / 1.4 g  Choose: Soybean oil (1 tbs) / 2.4 g  Choose: Canola oil (1 tbs) / 1 g Document Released: 11/27/2005 Document Revised: 03/24/2013 Document Reviewed: 02/25/2014 [employer] Patient Information 2015 [place], [place]. This information is not intended to replace advice given to you by your health care provider. Make sure you discuss any questions you have with your health care provider.

## 2015-10-11 DIAGNOSIS — Z1231 Encounter for screening mammogram for malignant neoplasm of breast: Secondary | ICD-10-CM | POA: Diagnosis not present

## 2015-10-11 LAB — HM MAMMOGRAPHY: HM Mammogram: NEGATIVE

## 2015-10-18 ENCOUNTER — Encounter: Payer: Self-pay | Admitting: *Deleted

## 2015-11-07 ENCOUNTER — Other Ambulatory Visit: Payer: Self-pay | Admitting: Family Medicine

## 2015-11-14 ENCOUNTER — Other Ambulatory Visit: Payer: Self-pay | Admitting: Family Medicine

## 2015-12-06 ENCOUNTER — Other Ambulatory Visit: Payer: Self-pay | Admitting: Family Medicine

## 2015-12-09 ENCOUNTER — Telehealth: Payer: Self-pay | Admitting: Family Medicine

## 2015-12-09 NOTE — Telephone Encounter (Signed)
Pt called about wanting to know if she needed to get labs done before her apt. I looked and there was no future labs put in for her to come in so patient states she would wait to get labs at visit Tuesday.

## 2015-12-14 ENCOUNTER — Ambulatory Visit (INDEPENDENT_AMBULATORY_CARE_PROVIDER_SITE_OTHER): Payer: Medicare Other | Admitting: Family Medicine

## 2015-12-14 ENCOUNTER — Ambulatory Visit (INDEPENDENT_AMBULATORY_CARE_PROVIDER_SITE_OTHER): Payer: Medicare Other

## 2015-12-14 ENCOUNTER — Encounter: Payer: Self-pay | Admitting: Family Medicine

## 2015-12-14 VITALS — BP 104/54 | HR 99 | Temp 97.1°F | Ht 68.0 in | Wt 158.4 lb

## 2015-12-14 DIAGNOSIS — N182 Chronic kidney disease, stage 2 (mild): Secondary | ICD-10-CM

## 2015-12-14 DIAGNOSIS — M545 Low back pain, unspecified: Secondary | ICD-10-CM

## 2015-12-14 DIAGNOSIS — Z23 Encounter for immunization: Secondary | ICD-10-CM

## 2015-12-14 DIAGNOSIS — I10 Essential (primary) hypertension: Secondary | ICD-10-CM | POA: Diagnosis not present

## 2015-12-14 DIAGNOSIS — D649 Anemia, unspecified: Secondary | ICD-10-CM | POA: Diagnosis not present

## 2015-12-14 DIAGNOSIS — E039 Hypothyroidism, unspecified: Secondary | ICD-10-CM

## 2015-12-14 MED ORDER — CYCLOBENZAPRINE HCL 10 MG PO TABS: 10.0000 mg | ORAL_TABLET | Freq: Three times a day (TID) | ORAL | Status: DC | PRN

## 2015-12-14 NOTE — Patient Instructions (Signed)

## 2015-12-14 NOTE — Progress Notes (Signed)
Quick Note:  Please contact the patient regarding: Osteoporosis apparently has caused some mild wedge compression of her lumbar vertebrae 12 and 3. I want to be sure that she is taking her osteoporosis medication. I would also like her to have a bone scan of her back to check for acute fracture. ______

## 2015-12-14 NOTE — Progress Notes (Signed)
Subjective:  Patient ID: Karenann Cai, female    DOB: 08/14/1946  Age: 70 y.o. MRN: [id number]  CC: 3 month follow up   HPI OLUWATOSIN BRACY presents for back pain. Chronic recurring. Currently can't stand for over 30 min, has to lay down to relieve pain. Severe sharp with pressure. Getsnauseated. Points to right lumbar area. Can't run vacuum. Can't lift. ONset 2-3 months ago. 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 10/10 after standing.   Patient presents for follow-up on  thyroid. She has a history of hypothyroidism recently has had to be adjusted. Most recent TSH was 28. Currently, Pt. denies any change in  voice, loss of hair, heat or cold intolerance. Energy level has been adequate to good. She has intermittent constipation. Denies diarrhea. No myxedema. Medication is as noted below. Verified that pt is taking it daily on an empty stomach. Well tolerated.  History Deanndra has a past medical history of Hypertension; Menopause; Hypothyroid; Mini stroke ([place]); Hyperlipidemia; B12 deficiency; Shortness of breath; Headache(784.0); Anxiety; Pneumonia; Anemia; Depression; Stroke [facility]) (2010); Alcoholism ([place]) (01/09/2012); Hyponatremia (01/09/2012); Chronic kidney disease (CKD), stage II (mild); COPD (chronic obstructive pulmonary disease) ([place]); Orthostatic syncope (10/02/2012); and Carotid artery narrowing (10/03/2012).   She has past surgical history that includes Appendectomy; Tonsillectomy; Cataract extraction w/PHACO (06/20/2012); and Cataract extraction w/PHACO (07/11/2012).   Her family history includes GER disease in her mother; Hypertension in her maternal grandmother.She reports that she quit smoking about 23 years ago. Her smoking use included Cigarettes. She has a 60 pack-year smoking history. She does not have any smokeless tobacco history on file. She reports that she drinks about 7.0 oz of alcohol per week. She reports that she does not use illicit  drugs.  Outpatient Prescriptions Prior to Visit  Medication Sig Dispense Refill  . acetaminophen (TYLENOL) 500 MG tablet Take 1 tablet (500 mg total) by mouth every 6 (six) hours as needed for moderate pain. Pain 90 tablet 4  . allopurinol (ZYLOPRIM) 300 MG tablet Take 1 tablet (300 mg total) by mouth daily. 30 tablet 11  . aspirin EC 81 MG tablet Take 81 mg by mouth daily.    . diclofenac (VOLTAREN) 75 MG EC tablet TAKE ONE TABLET BY MOUTH TWICE DAILY 60 tablet 0  . DULoxetine (CYMBALTA) 60 MG capsule Take 1 capsule (60 mg total) by mouth daily. 90 capsule 3  . DULoxetine (CYMBALTA) 60 MG capsule TAKE ONE CAPSULE BY MOUTH ONCE DAILY 30 capsule 2  . folic acid (FOLVITE) 1 MG tablet Take 1 tablet (1 mg total) by mouth daily. 90 tablet 3  . indomethacin (INDOCIN SR) 75 MG CR capsule Take 1 capsule (75 mg total) by mouth daily as needed (for gout). 60 capsule 5  . levothyroxine (SYNTHROID, LEVOTHROID) 100 MCG tablet Take 1 tablet (100 mcg total) by mouth daily. 90 tablet 3  . thiamine (VITAMIN B-1) 100 MG tablet Take 1 tablet (100 mg total) by mouth daily. 90 tablet 3  . valsartan (DIOVAN) 160 MG tablet Take 1 tablet (160 mg total) by mouth daily. 1 tablet daily x 1 wk Then 2 tablets daily 60 tablet 5  . ciprofloxacin (CIPRO) 500 MG tablet Take 1 tablet (500 mg total) by mouth 2 (two) times daily. 14 tablet 0  . fluconazole (DIFLUCAN) 150 MG tablet Take 1 tablet (150 mg total) by mouth once. Repeat in one week 2 tablet 0   No facility-administered medications prior to visit.    ROS Review of  Systems  Constitutional: Negative for fever, activity change and appetite change.  HENT: Negative for congestion, rhinorrhea and sore throat.   Eyes: Negative for visual disturbance.  Respiratory: Negative for cough and shortness of breath.   Cardiovascular: Negative for chest pain and palpitations.  Gastrointestinal: Negative for nausea, abdominal pain and diarrhea.  Genitourinary: Negative for  dysuria.  Musculoskeletal: Negative for myalgias and arthralgias.    Objective:  BP 104/54 mmHg  Pulse 99  Temp(Src) 97.1 F (36.2 C) (Oral)  Ht _0  (1.727 m)  Wt 158 lb 6 oz (71.838 kg)  BMI 24.09 kg/m2  LMP 08/21/2013  BP Readings from Last 3 Encounters:  12/14/15 104/54  09/10/15 108/65  08/10/15 126/77    Wt Readings from Last 3 Encounters:  12/14/15 158 lb 6 oz (71.838 kg)  09/10/15 160 lb 12.8 oz (72.938 kg)  08/10/15 163 lb (73.936 kg)     Physical Exam  Constitutional: She is oriented to person, place, and time. She appears well-developed and well-nourished. No distress.  HENT:  Head: Normocephalic and atraumatic.  Right Ear: External ear normal.  Left Ear: External ear normal.  Nose: Nose normal.  Mouth/Throat: Oropharynx is clear and moist.  Eyes: Conjunctivae and EOM are normal. Pupils are equal, round, and reactive to light.  Neck: Normal range of motion. Neck supple. No thyromegaly present.  Cardiovascular: Normal rate, regular rhythm and normal heart sounds.   No murmur heard. Pulmonary/Chest: Effort normal and breath sounds normal. No respiratory distress. She has no wheezes. She has no rales.  Abdominal: Soft. Bowel sounds are normal. She exhibits no distension. There is no tenderness.  Musculoskeletal: She exhibits tenderness (:Noted at right SI and L5 musculature at the right paraspinous region.).  Lymphadenopathy:    She has no cervical adenopathy.  Neurological: She is alert and oriented to person, place, and time. She has normal reflexes.  Skin: Skin is warm and dry.  Psychiatric: She has a normal mood and affect. Her behavior is normal. Judgment and thought content normal.     Lab Results  Component Value Date   WBC 4.2 04/28/2015   HGB 9.2* 04/28/2015   HCT 29.6* 04/28/2015   PLT 171 04/28/2015   GLUCOSE 92 08/05/2015   CHOL 210* 08/05/2015   TRIG 81 08/05/2015   HDL 48 08/05/2015   LDLCALC 146* 08/05/2015   ALT 7 08/05/2015   AST  9 08/05/2015   NA 141 08/05/2015   K 4.6 08/05/2015   CL 101 08/05/2015   CREATININE 1.29* 08/05/2015   BUN 26 08/05/2015   CO2 23 08/05/2015   TSH 28.850* 09/08/2015   INR 1.07 04/28/2015   HGBA1C 4.7 11/13/2013    Mr Knee Left  Wo Contrast  08/24/2015  CLINICAL DATA:  Chronic left knee pain. EXAM: MRI OF THE LEFT KNEE WITHOUT CONTRAST TECHNIQUE: Multiplanar, multisequence MR imaging of the knee was performed. No intravenous contrast was administered. COMPARISON:  Radiographs dated 08/10/2015 FINDINGS: MENISCI Medial meniscus:  Normal. Lateral meniscus: There is a complex tear of the undersurface of the subluxed midbody. The meniscus is subluxed into the inferior gutter. There is diffuse degeneration of the anterior horn without a discrete tear of the anterior horn. LIGAMENTS Cruciates:  Normal. Collaterals: Chronic degenerative changes of the origin of the LCL. Normal MCL. 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 Medial: Slight irregularity of the articular cartilage of the central portion of the femoral condyle. Lateral: Extensive full-thickness cartilage loss of the tibial plateau  and femoral condyle with grade 4 changes of the tibial plateau. There is multifocal grade 4 chondromalacia of the posterior aspect of the femoral condyle. Joint:  Small joint effusion. Popliteal Fossa:  Normal. Extensor Mechanism:  Normal. Bones: Tricompartmental marginal osteophytes, most prominent in the lateral compartment with sclerosis of the condyle and tibial plateau with marked medial joint space narrowing. IMPRESSION: 1. 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 2. Extensive chondromalacia of the patella. Electronically Signed   By: Lorriane Shire M.D.   On: 08/24/2015 13:09    Assessment & Plan:   Ambrea was seen today for 3 month follow up.  Diagnoses and all orders for this visit:  Right-sided low back  pain without sciatica -     CBC with Differential/Platelet -     CMP14+EGFR -     DG Lumbar Spine 2-3 Views; Future -     Ambulatory referral to Physical Therapy  Essential hypertension -     CBC with Differential/Platelet -     CMP14+EGFR -     POCT urinalysis dipstick  Hypothyroidism, unspecified hypothyroidism type -     CBC with Differential/Platelet -     CMP14+EGFR -     TSH + free T4  Chronic kidney disease (CKD), stage II (mild) -     CBC with Differential/Platelet -     CMP14+EGFR -     POCT urinalysis dipstick  Other orders -     cyclobenzaprine (FLEXERIL) 10 MG tablet; Take 1 tablet (10 mg total) by mouth 3 (three) times daily as needed for muscle spasms.  I have discontinued Ms. Price's ciprofloxacin and fluconazole. I am also having her start on cyclobenzaprine. Additionally, I am having her maintain her allopurinol, indomethacin, aspirin EC, acetaminophen, DULoxetine, folic acid, thiamine, valsartan, levothyroxine, DULoxetine, and diclofenac.  Meds ordered this encounter  Medications  . cyclobenzaprine (FLEXERIL) 10 MG tablet    Sig: Take 1 tablet (10 mg total) by mouth 3 (three) times daily as needed for muscle spasms.    Dispense:  90 tablet    Refill:  1     Follow-up: Return in about 6 months (around 06/12/2016), or if symptoms worsen or fail to improve.  Claretta Fraise, M.D.

## 2015-12-15 ENCOUNTER — Observation Stay (HOSPITAL_COMMUNITY)
Admission: EM | Admit: 2015-12-15 | Discharge: 2015-12-17 | Disposition: A | Discharge status: Home / Self Care | Payer: Medicare Other | Attending: Family Medicine | Admitting: Family Medicine

## 2015-12-15 ENCOUNTER — Encounter: Payer: Self-pay | Admitting: *Deleted

## 2015-12-15 ENCOUNTER — Encounter (HOSPITAL_COMMUNITY): Payer: Self-pay | Admitting: *Deleted

## 2015-12-15 DIAGNOSIS — K259 Gastric ulcer, unspecified as acute or chronic, without hemorrhage or perforation: Secondary | ICD-10-CM | POA: Insufficient documentation

## 2015-12-15 DIAGNOSIS — I10 Essential (primary) hypertension: Secondary | ICD-10-CM | POA: Diagnosis not present

## 2015-12-15 DIAGNOSIS — E785 Hyperlipidemia, unspecified: Secondary | ICD-10-CM | POA: Insufficient documentation

## 2015-12-15 DIAGNOSIS — I129 Hypertensive chronic kidney disease with stage 1 through stage 4 chronic kidney disease, or unspecified chronic kidney disease: Secondary | ICD-10-CM | POA: Insufficient documentation

## 2015-12-15 DIAGNOSIS — D500 Iron deficiency anemia secondary to blood loss (chronic): Secondary | ICD-10-CM

## 2015-12-15 DIAGNOSIS — Z79899 Other long term (current) drug therapy: Secondary | ICD-10-CM | POA: Diagnosis not present

## 2015-12-15 DIAGNOSIS — Z888 Allergy status to other drugs, medicaments and biological substances status: Secondary | ICD-10-CM | POA: Diagnosis not present

## 2015-12-15 DIAGNOSIS — N182 Chronic kidney disease, stage 2 (mild): Secondary | ICD-10-CM | POA: Diagnosis not present

## 2015-12-15 DIAGNOSIS — K922 Gastrointestinal hemorrhage, unspecified: Secondary | ICD-10-CM | POA: Insufficient documentation

## 2015-12-15 DIAGNOSIS — R531 Weakness: Secondary | ICD-10-CM | POA: Diagnosis not present

## 2015-12-15 DIAGNOSIS — K317 Polyp of stomach and duodenum: Secondary | ICD-10-CM | POA: Diagnosis not present

## 2015-12-15 DIAGNOSIS — Z8673 Personal history of transient ischemic attack (TIA), and cerebral infarction without residual deficits: Secondary | ICD-10-CM | POA: Diagnosis not present

## 2015-12-15 DIAGNOSIS — J449 Chronic obstructive pulmonary disease, unspecified: Secondary | ICD-10-CM | POA: Diagnosis not present

## 2015-12-15 DIAGNOSIS — R195 Other fecal abnormalities: Secondary | ICD-10-CM | POA: Insufficient documentation

## 2015-12-15 DIAGNOSIS — E039 Hypothyroidism, unspecified: Secondary | ICD-10-CM | POA: Diagnosis present

## 2015-12-15 DIAGNOSIS — K5730 Diverticulosis of large intestine without perforation or abscess without bleeding: Secondary | ICD-10-CM | POA: Diagnosis not present

## 2015-12-15 DIAGNOSIS — D62 Acute posthemorrhagic anemia: Secondary | ICD-10-CM | POA: Diagnosis not present

## 2015-12-15 DIAGNOSIS — D649 Anemia, unspecified: Secondary | ICD-10-CM | POA: Diagnosis present

## 2015-12-15 DIAGNOSIS — F329 Major depressive disorder, single episode, unspecified: Secondary | ICD-10-CM | POA: Insufficient documentation

## 2015-12-15 DIAGNOSIS — E538 Deficiency of other specified B group vitamins: Secondary | ICD-10-CM | POA: Diagnosis present

## 2015-12-15 DIAGNOSIS — F419 Anxiety disorder, unspecified: Secondary | ICD-10-CM | POA: Insufficient documentation

## 2015-12-15 DIAGNOSIS — K633 Ulcer of intestine: Secondary | ICD-10-CM | POA: Insufficient documentation

## 2015-12-15 DIAGNOSIS — D509 Iron deficiency anemia, unspecified: Secondary | ICD-10-CM | POA: Diagnosis not present

## 2015-12-15 LAB — CBC WITH DIFFERENTIAL/PLATELET
Basophils Absolute: 0 10*3/uL (ref 0.0–0.2)
Basophils Absolute: 0 10*3/uL (ref 0.0–0.1)
Basophils Relative: 0 %
Basos: 0 %
EOS (ABSOLUTE): 0.3 10*3/uL (ref 0.0–0.4)
Eos: 2 %
Eosinophils Absolute: 0.6 10*3/uL (ref 0.0–0.7)
Eosinophils Relative: 4 %
HCT: 21.1 % — ABNORMAL LOW (ref 36.0–46.0)
Hematocrit: 20.4 % — ABNORMAL LOW (ref 34.0–46.6)
Hemoglobin: 5.9 g/dL — CL (ref 11.1–15.9)
Hemoglobin: 6 g/dL — CL (ref 12.0–15.0)
Immature Grans (Abs): 0.1 10*3/uL (ref 0.0–0.1)
Immature Granulocytes: 1 %
Lymphocytes Absolute: 1.5 10*3/uL (ref 0.7–3.1)
Lymphocytes Relative: 17 %
Lymphs Abs: 2.3 10*3/uL (ref 0.7–4.0)
Lymphs: 10 %
MCH: 19.8 pg — ABNORMAL LOW (ref 26.6–33.0)
MCH: 20.8 pg — ABNORMAL LOW (ref 26.0–34.0)
MCHC: 28.9 g/dL — ABNORMAL LOW (ref 31.5–35.7)
MCHC: 28.4 g/dL — ABNORMAL LOW (ref 30.0–36.0)
MCV: 69 fL — ABNORMAL LOW (ref 79–97)
MCV: 73 fL — ABNORMAL LOW (ref 78.0–100.0)
Monocytes Absolute: 0.7 10*3/uL (ref 0.1–0.9)
Monocytes Absolute: 0.9 10*3/uL (ref 0.1–1.0)
Monocytes Relative: 7 %
Monocytes: 4 %
Neutro Abs: 9.8 10*3/uL — ABNORMAL HIGH (ref 1.7–7.7)
Neutrophils Absolute: 12.3 10*3/uL — ABNORMAL HIGH (ref 1.4–7.0)
Neutrophils Relative %: 72 %
Neutrophils: 83 %
Platelets: 581 10*3/uL — ABNORMAL HIGH (ref 150–379)
Platelets: 604 10*3/uL — ABNORMAL HIGH (ref 150–400)
RBC: 2.98 x10E6/uL — ABNORMAL LOW (ref 3.77–5.28)
RBC: 2.89 MIL/uL — ABNORMAL LOW (ref 3.87–5.11)
RDW: 15.9 % — ABNORMAL HIGH (ref 12.3–15.4)
RDW: 15.8 % — ABNORMAL HIGH (ref 11.5–15.5)
WBC: 14.9 10*3/uL — ABNORMAL HIGH (ref 3.4–10.8)
WBC: 13.6 10*3/uL — ABNORMAL HIGH (ref 4.0–10.5)

## 2015-12-15 LAB — CMP14+EGFR
ALT: 9 IU/L (ref 0–32)
AST: 7 IU/L (ref 0–40)
Albumin/Globulin Ratio: 1.7 (ref 1.1–2.5)
Albumin: 4 g/dL (ref 3.6–4.8)
Alkaline Phosphatase: 119 IU/L — ABNORMAL HIGH (ref 39–117)
BUN/Creatinine Ratio: 17 (ref 11–26)
BUN: 23 mg/dL (ref 8–27)
Bilirubin Total: 0.2 mg/dL (ref 0.0–1.2)
CO2: 17 mmol/L — ABNORMAL LOW (ref 18–29)
Calcium: 9 mg/dL (ref 8.7–10.3)
Chloride: 96 mmol/L (ref 96–106)
Creatinine, Ser: 1.38 mg/dL — ABNORMAL HIGH (ref 0.57–1.00)
GFR calc Af Amer: 45 mL/min/{1.73_m2} — ABNORMAL LOW (ref >59)
GFR calc non Af Amer: 39 mL/min/{1.73_m2} — ABNORMAL LOW (ref >59)
Globulin, Total: 2.3 g/dL (ref 1.5–4.5)
Glucose: 91 mg/dL (ref 65–99)
Potassium: 4.7 mmol/L (ref 3.5–5.2)
Sodium: 135 mmol/L (ref 134–144)
Total Protein: 6.3 g/dL (ref 6.0–8.5)

## 2015-12-15 LAB — ABO/RH: ABO/RH(D): O POS

## 2015-12-15 LAB — POC OCCULT BLOOD, ED: Fecal Occult Bld: POSITIVE — AB

## 2015-12-15 LAB — BASIC METABOLIC PANEL
Anion gap: 12 (ref 5–15)
BUN: 24 mg/dL — ABNORMAL HIGH (ref 6–20)
CO2: 20 mmol/L — ABNORMAL LOW (ref 22–32)
Calcium: 8.7 mg/dL — ABNORMAL LOW (ref 8.9–10.3)
Chloride: 105 mmol/L (ref 101–111)
Creatinine, Ser: 1.51 mg/dL — ABNORMAL HIGH (ref 0.44–1.00)
GFR calc Af Amer: 40 mL/min — ABNORMAL LOW (ref >60)
GFR calc non Af Amer: 34 mL/min — ABNORMAL LOW (ref >60)
Glucose, Bld: 96 mg/dL (ref 65–99)
Potassium: 4.3 mmol/L (ref 3.5–5.1)
Sodium: 137 mmol/L (ref 135–145)

## 2015-12-15 LAB — PREPARE RBC (CROSSMATCH)

## 2015-12-15 LAB — RETICULOCYTES
RBC.: 2.92 MIL/uL — ABNORMAL LOW (ref 3.87–5.11)
Retic Count, Absolute: 73 10*3/uL (ref 19.0–186.0)
Retic Ct Pct: 2.5 % (ref 0.4–3.1)

## 2015-12-15 LAB — TSH+FREE T4
Free T4: 1.4 ng/dL (ref 0.82–1.77)
TSH: 9.2 u[IU]/mL — ABNORMAL HIGH (ref 0.450–4.500)

## 2015-12-15 MED ORDER — IRBESARTAN 150 MG PO TABS
150.0000 mg | ORAL_TABLET | Freq: Every day | ORAL | Status: DC
  Administered 2015-12-16: 150 mg via ORAL
  Filled 2015-12-15: qty 1

## 2015-12-15 MED ORDER — SODIUM CHLORIDE 0.9 % IV SOLN
INTRAVENOUS | Status: DC
  Administered 2015-12-16 – 2015-12-17 (×3): via INTRAVENOUS

## 2015-12-15 MED ORDER — ONDANSETRON HCL 4 MG/2ML IJ SOLN: 4.0000 mg | Freq: Four times a day (QID) | INTRAMUSCULAR | Status: DC | PRN

## 2015-12-15 MED ORDER — SODIUM CHLORIDE 0.9 % IV SOLN
Freq: Once | INTRAVENOUS | Status: AC
  Administered 2015-12-15: 21:00:00 via INTRAVENOUS

## 2015-12-15 MED ORDER — FOLIC ACID 1 MG PO TABS
1.0000 mg | ORAL_TABLET | Freq: Every day | ORAL | Status: DC
  Administered 2015-12-16 (×2): 1 mg via ORAL
  Filled 2015-12-15 (×2): qty 1

## 2015-12-15 MED ORDER — SODIUM CHLORIDE 0.9 % IV BOLUS (SEPSIS)
500.0000 mL | Freq: Once | INTRAVENOUS | Status: AC
  Administered 2015-12-15: 500 mL via INTRAVENOUS

## 2015-12-15 MED ORDER — DULOXETINE HCL 60 MG PO CPEP
60.0000 mg | ORAL_CAPSULE | Freq: Every day | ORAL | Status: DC
  Administered 2015-12-16: 60 mg via ORAL
  Filled 2015-12-15: qty 1

## 2015-12-15 MED ORDER — LEVOTHYROXINE SODIUM 100 MCG PO TABS
100.0000 ug | ORAL_TABLET | Freq: Every day | ORAL | Status: DC
  Administered 2015-12-16: 100 ug via ORAL
  Filled 2015-12-15: qty 1

## 2015-12-15 MED ORDER — LEVOTHYROXINE SODIUM 100 MCG PO TABS: 100.0000 ug | ORAL_TABLET | Freq: Every day | ORAL | Status: DC

## 2015-12-15 MED ORDER — CYCLOBENZAPRINE HCL 10 MG PO TABS: 10.0000 mg | ORAL_TABLET | Freq: Three times a day (TID) | ORAL | Status: DC | PRN

## 2015-12-15 MED ORDER — DULOXETINE HCL 60 MG PO CPEP: 60.0000 mg | ORAL_CAPSULE | Freq: Every day | ORAL | Status: DC

## 2015-12-15 MED ORDER — ACETAMINOPHEN 500 MG PO TABS
500.0000 mg | ORAL_TABLET | Freq: Four times a day (QID) | ORAL | Status: DC | PRN
  Administered 2015-12-16: 500 mg via ORAL
  Filled 2015-12-15: qty 1

## 2015-12-15 MED ORDER — ALLOPURINOL 300 MG PO TABS: 300.0000 mg | ORAL_TABLET | Freq: Every day | ORAL | Status: DC

## 2015-12-15 MED ORDER — VITAMIN B-1 100 MG PO TABS
100.0000 mg | ORAL_TABLET | Freq: Every day | ORAL | Status: DC
Start: 2015-12-15 — End: 2015-12-17
  Administered 2015-12-16 (×2): 100 mg via ORAL
  Filled 2015-12-15 (×2): qty 1

## 2015-12-15 MED ORDER — ONDANSETRON HCL 4 MG PO TABS: 4.0000 mg | ORAL_TABLET | Freq: Four times a day (QID) | ORAL | Status: DC | PRN

## 2015-12-15 MED ORDER — ALLOPURINOL 300 MG PO TABS: 300.0000 mg | ORAL_TABLET | Freq: Every day | ORAL | Status: DC | PRN

## 2015-12-15 NOTE — H&P (Signed)
[facility] History and Physical  [name] [id number] DOB: 04/16/1946 DOA: 12/15/2015  Referring physician: ER PCP: Redge Gainer, MD   Chief Complaint: Weakness  HPI: [name] is a 70 y.o. female  This is a 70 year old  lady who was sent to the emergency room from the primary care physician's office. She had gone for follow-up of her hypothyroidism and blood work was done. The blood work showed a hemoglobin of 5.9. The patient denies any hematemesis, melena, rectal bleeding. She does have some constipation and abdominal pain. She denies any weight loss. She apparently has daily B-12 and thiamine supplementation for long-standing anemia. It has been recorded that she has alcohol abuse but she denied drinking any alcohol present time. She is now being admitted for further investigation of her severe microcytic anemia.   Review of Systems:  Apart from symptoms above, all other systems are negative.   Past Medical History  Diagnosis Date  . Hypertension   . Menopause   . Hypothyroid   . Mini stroke ([place])   . Hyperlipidemia   . B12 deficiency   . Shortness of breath   . Headache(784.0)   . Anxiety   . Pneumonia   . Anemia   . Depression   . Stroke [facility]) 2010    no deficits  . Alcoholism ([place]) 01/09/2012  . Hyponatremia 01/09/2012  . Chronic kidney disease (CKD), stage II (mild)   . COPD (chronic obstructive pulmonary disease) ([place])   . Orthostatic syncope 10/02/2012  . Carotid artery narrowing 10/03/2012    On the right.   Past Surgical History  Procedure Laterality Date  . Appendectomy    . Tonsillectomy    . Cataract extraction w/phaco  06/20/2012    Procedure: CATARACT EXTRACTION PHACO AND INTRAOCULAR LENS PLACEMENT (IOC);  Surgeon: Tonny Branch, MD;  Location: [facility];  Service: Ophthalmology;  Laterality: Right;  CDE:10.66  . Cataract extraction w/phaco  07/11/2012    Procedure: CATARACT EXTRACTION PHACO AND INTRAOCULAR LENS PLACEMENT (IOC);  Surgeon:  Tonny Branch, MD;  Location: [facility];  Service: Ophthalmology;  Laterality: Left;  CDE: 12.69   Social History:  reports that she quit smoking about 23 years ago. Her smoking use included Cigarettes. She has a 60 pack-year smoking history. She does not have any smokeless tobacco history on file. She reports that she drinks about 7.0 oz of alcohol per week. She reports that she does not use illicit drugs.  Allergies  Allergen Reactions  . Norvasc [Amlodipine Besylate] Hives  . Procardia [Nifedipine] Swelling    Edema      Family History  Problem Relation Age of Onset  . GER disease Mother   . Hypertension Maternal Grandmother     Prior to Admission medications   Medication Sig Start Date End Date Taking? Authorizing Provider  acetaminophen (TYLENOL) 500 MG tablet Take 1 tablet (500 mg total) by mouth every 6 (six) hours as needed for moderate pain. Pain 07/12/15  Yes Claretta Fraise, MD  allopurinol (ZYLOPRIM) 300 MG tablet Take 1 tablet (300 mg total) by mouth daily. Patient taking differently: Take 300 mg by mouth daily as needed (for gout flare).  09/11/14  Yes Lysbeth Penner, FNP  aspirin EC 81 MG tablet Take 81 mg by mouth daily.   Yes Historical Provider, MD  diclofenac (VOLTAREN) 75 MG EC tablet TAKE ONE TABLET BY MOUTH TWICE DAILY 12/07/15  Yes Claretta Fraise, MD  DULoxetine (CYMBALTA) 60 MG capsule Take 1 capsule (60 mg  total) by mouth daily. 07/12/15  Yes Claretta Fraise, MD  folic acid (FOLVITE) 1 MG tablet Take 1 tablet (1 mg total) by mouth daily. 07/12/15  Yes Claretta Fraise, MD  indomethacin (INDOCIN SR) 75 MG CR capsule Take 1 capsule (75 mg total) by mouth daily as needed (for gout). 04/29/15  Yes Ripudeep Krystal Eaton, MD  levothyroxine (SYNTHROID, LEVOTHROID) 100 MCG tablet Take 1 tablet (100 mcg total) by mouth daily. 09/10/15  Yes Claretta Fraise, MD  thiamine (VITAMIN B-1) 100 MG tablet Take 1 tablet (100 mg total) by mouth daily. 07/12/15  Yes Claretta Fraise, MD  valsartan (DIOVAN) 160 MG  tablet Take 1 tablet (160 mg total) by mouth daily. 1 tablet daily x 1 wk Then 2 tablets daily Patient taking differently: Take 320 mg by mouth daily.  08/10/15  Yes Claretta Fraise, MD  cyclobenzaprine (FLEXERIL) 10 MG tablet Take 1 tablet (10 mg total) by mouth 3 (three) times daily as needed for muscle spasms. 12/14/15   Claretta Fraise, MD   Physical Exam: Filed Vitals:   12/15/15 1849 12/15/15 1942 12/15/15 2020  BP: 143/64 137/72 139/71  Pulse: 107 91 91  Temp: 98.7 F (37.1 C)    TempSrc: Oral    Resp: 16 16 16   Height: 5\' 8"  (1.727 m)    Weight: 70.761 kg (156 lb)    SpO2: 100% 100% 100%    Wt Readings from Last 3 Encounters:  12/15/15 70.761 kg (156 lb)  12/14/15 71.838 kg (158 lb 6 oz)  09/10/15 72.938 kg (160 lb 12.8 oz)    General:  Appears pale. She is hemodynamically stable. Eyes: PERRL, normal lids, irises & conjunctiva ENT: grossly normal hearing, lips & tongue Neck: no LAD, masses or thyromegaly Cardiovascular: RRR, no m/r/g. No LE edema. Telemetry: SR, no arrhythmias  Respiratory: CTA bilaterally, no w/r/r. Normal respiratory effort. Abdomen: soft, ntnd. No masses felt. Rectal examination done by the emergency room physician shows that she is Hemoccult positive. Skin: no rash or induration seen on limited exam Musculoskeletal: grossly normal tone BUE/BLE Psychiatric: grossly normal mood and affect, speech fluent and appropriate Neurologic: grossly non-focal.          Labs on Admission:  Basic Metabolic Panel:  Recent Labs Lab 12/14/15 1207 12/15/15 1910  NA 135 137  K 4.7 4.3  CL 96 105  CO2 17* 20*  GLUCOSE 91 96  BUN 23 24*  CREATININE 1.38* 1.51*  CALCIUM 9.0 8.7*   Liver Function Tests:  Recent Labs Lab 12/14/15 1207  AST 7  ALT 9  ALKPHOS 119*  BILITOT 0.2  PROT 6.3  ALBUMIN 4.0   No results for input(s): LIPASE, AMYLASE in the last 168 hours. No results for input(s): AMMONIA in the last 168 hours. CBC:  Recent Labs Lab  12/14/15 1207 12/15/15 1910  WBC 14.9* 13.6*  NEUTROABS 12.3* 9.8*  HGB  --  6.0*  HCT 20.4* 21.1*  MCV  --  73.0*  PLT  --  604*   Cardiac Enzymes: No results for input(s): CKTOTAL, CKMB, CKMBINDEX, TROPONINI in the last 168 hours.  BNP (last 3 results) No results for input(s): BNP in the last 8760 hours.  ProBNP (last 3 results) No results for input(s): PROBNP in the last 8760 hours.  CBG: No results for input(s): GLUCAP in the last 168 hours.  Radiological Exams on Admission: Dg Lumbar Spine 2-3 Views  12/14/2015  CLINICAL DATA:  Low back pain with standing and walking, difficulty standing for  prolonged periods of time EXAM: LUMBAR SPINE - 2-3 VIEW COMPARISON:  None in PACs FINDINGS: There is anterior wedge compression of L1, L2 and L3. These compressions are of uncertain age. The loss of height anteriorly at L1 is approximately 20%. Of L2 of the anterior height loss is approximately 10%. At L3 the anterior height loss is approximately 15%. There is mild multilevel degenerative disc space narrowing of the lumbar spine. There is mild antro listhesis of L4 with respect L3 amounting to approximately 3 mm. There is facet joint hypertrophy at L5-S1. The pedicles and transverse processes are intact. IMPRESSION: There are mild wedge compressions of L1 through L3 as described. The age of these is unclear. There is multilevel degenerative disc space narrowing. Mild grade 1 anterolisthesis of L4 with respect L3 is present and likely secondary to degenerative disc and facet joint change. Electronically Signed   By: David  Martinique M.D.   On: 12/14/2015 12:25     Assessment/Plan   1. Microcytic anemia. Anemia panel has been sent. She will be given 2 units blood transfusion. This likely represents a chronic GI bleed and she will need endoscopy, EGD and/or colonoscopy. Consult gastroenterology. Clear fluid diet. 2. Hypothyroidism. Her most recent TSH was 9.2, elevated. This can be followed up as an  outpatient. 3. Chronic kidney disease. Her creatinine is slightly elevated. Monitor closely after IV fluids. 4. Hypertension. Continue home medications.  She'll be admitted to the medical floor. Further recommendations will depend on patient's hospital progress.  Code Status: Full code  DVT Prophylaxis: SCDs.  Family Communication: I discussed the plan with the patient at the bedside.   Disposition Plan: Home when medically stable.   Time spent: 45 minutes.  Doree Albee [facility] Pager [phone].

## 2015-12-15 NOTE — ED Provider Notes (Signed)
CSN: [id number]     Arrival date & time 12/15/15  1846 History   First MD Initiated Contact with Patient 12/15/15 1859     Chief Complaint  Patient presents with  . Low Hemoglobin      (Consider location/radiation/quality/duration/timing/severity/associated sxs/prior Treatment) Patient is a 70 y.o. female presenting with weakness. The history is provided by the patient (Patient complains of weakness for a number days. She was seen by her doctor yesterday and her hemoglobin was 5.9).  Weakness This is a new problem. The current episode started more than 2 days ago. The problem occurs constantly. The problem has not changed since onset.Pertinent negatives include no chest pain, no abdominal pain and no headaches. Nothing aggravates the symptoms. Nothing relieves the symptoms.    Past Medical History  Diagnosis Date  . Hypertension   . Menopause   . Hypothyroid   . Mini stroke ([place])   . Hyperlipidemia   . B12 deficiency   . Shortness of breath   . Headache(784.0)   . Anxiety   . Pneumonia   . Anemia   . Depression   . Stroke [facility]) 2010    no deficits  . Alcoholism ([employer]) 01/09/2012  . Hyponatremia 01/09/2012  . Chronic kidney disease (CKD), stage II (mild)   . COPD (chronic obstructive pulmonary disease) ([place])   . Orthostatic syncope 10/02/2012  . Carotid artery narrowing 10/03/2012    On the right.   Past Surgical History  Procedure Laterality Date  . Appendectomy    . Tonsillectomy    . Cataract extraction w/phaco  06/20/2012    Procedure: CATARACT EXTRACTION PHACO AND INTRAOCULAR LENS PLACEMENT (IOC);  Surgeon: Tonny Branch, MD;  Location: [facility];  Service: Ophthalmology;  Laterality: Right;  CDE:10.66  . Cataract extraction w/phaco  07/11/2012    Procedure: CATARACT EXTRACTION PHACO AND INTRAOCULAR LENS PLACEMENT (IOC);  Surgeon: Tonny Branch, MD;  Location: [facility];  Service: Ophthalmology;  Laterality: Left;  CDE: 12.69   Family History  Problem Relation Age of Onset  . GER  disease Mother   . Hypertension Maternal Grandmother    Social History  Substance Use Topics  . Smoking status: Former Smoker -- 2.00 packs/day for 30 years    Types: Cigarettes    Quit date: 06/17/1992  . Smokeless tobacco: None  . Alcohol Use: 7.0 oz/week    14 drink(s) per week   OB History    No data available     Review of Systems  Constitutional: Negative for appetite change and fatigue.  HENT: Negative for congestion, ear discharge and sinus pressure.   Eyes: Negative for discharge.  Respiratory: Negative for cough.   Cardiovascular: Negative for chest pain.  Gastrointestinal: Negative for abdominal pain and diarrhea.  Genitourinary: Negative for frequency and hematuria.  Musculoskeletal: Negative for back pain.  Skin: Negative for rash.  Neurological: Positive for weakness. Negative for seizures and headaches.  Psychiatric/Behavioral: Negative for hallucinations.      Allergies  Norvasc and Procardia  Home Medications   Prior to Admission medications   Medication Sig Start Date End Date Taking? Authorizing Provider  acetaminophen (TYLENOL) 500 MG tablet Take 1 tablet (500 mg total) by mouth every 6 (six) hours as needed for moderate pain. Pain 07/12/15   Claretta Fraise, MD  allopurinol (ZYLOPRIM) 300 MG tablet Take 1 tablet (300 mg total) by mouth daily. 09/11/14   Lysbeth Penner, FNP  aspirin EC 81 MG tablet Take 81 mg by mouth daily.  Historical Provider, MD  cyclobenzaprine (FLEXERIL) 10 MG tablet Take 1 tablet (10 mg total) by mouth 3 (three) times daily as needed for muscle spasms. 12/14/15   Claretta Fraise, MD  diclofenac (VOLTAREN) 75 MG EC tablet TAKE ONE TABLET BY MOUTH TWICE DAILY 12/07/15   Claretta Fraise, MD  DULoxetine (CYMBALTA) 60 MG capsule Take 1 capsule (60 mg total) by mouth daily. 07/12/15   Claretta Fraise, MD  DULoxetine (CYMBALTA) 60 MG capsule TAKE ONE CAPSULE BY MOUTH ONCE DAILY 11/15/15   Claretta Fraise, MD  folic acid (FOLVITE) 1 MG tablet Take 1  tablet (1 mg total) by mouth daily. 07/12/15   Claretta Fraise, MD  indomethacin (INDOCIN SR) 75 MG CR capsule Take 1 capsule (75 mg total) by mouth daily as needed (for gout). 04/29/15   Ripudeep Krystal Eaton, MD  levothyroxine (SYNTHROID, LEVOTHROID) 100 MCG tablet Take 1 tablet (100 mcg total) by mouth daily. 09/10/15   Claretta Fraise, MD  thiamine (VITAMIN B-1) 100 MG tablet Take 1 tablet (100 mg total) by mouth daily. 07/12/15   Claretta Fraise, MD  valsartan (DIOVAN) 160 MG tablet Take 1 tablet (160 mg total) by mouth daily. 1 tablet daily x 1 wk Then 2 tablets daily 08/10/15   Claretta Fraise, MD   BP 139/71 mmHg  Pulse 91  Temp(Src) 98.7 F (37.1 C) (Oral)  Resp 16  Ht 5\' 8"  (1.727 m)  Wt 156 lb (70.761 kg)  BMI 23.73 kg/m2  SpO2 100%  LMP 08/21/2013 Physical Exam  Constitutional: She is oriented to person, place, and time. She appears well-developed.  HENT:  Head: Normocephalic.  Eyes: Conjunctivae and EOM are normal. No scleral icterus.  Neck: Neck supple. No thyromegaly present.  Cardiovascular: Normal rate and regular rhythm.  Exam reveals no gallop and no friction rub.   No murmur heard. Pulmonary/Chest: No stridor. She has no wheezes. She has no rales. She exhibits no tenderness.  Abdominal: She exhibits no distension. There is no tenderness. There is no rebound.  Genitourinary:  Rectal Brown stool heme positive and otherwise normal exam  Musculoskeletal: Normal range of motion. She exhibits no edema.  Lymphadenopathy:    She has no cervical adenopathy.  Neurological: She is oriented to person, place, and time. She exhibits normal muscle tone. Coordination normal.  Skin: No rash noted. No erythema.  Psychiatric: She has a normal mood and affect. Her behavior is normal.    ED Course  Procedures (including critical care time) Labs Review Labs Reviewed  CBC WITH DIFFERENTIAL/PLATELET - Abnormal; Notable for the following:    WBC 13.6 (*)    RBC 2.89 (*)    Hemoglobin 6.0 (*)    HCT  21.1 (*)    MCV 73.0 (*)    MCH 20.8 (*)    MCHC 28.4 (*)    RDW 15.8 (*)    Platelets 604 (*)    Neutro Abs 9.8 (*)    All other components within normal limits  BASIC METABOLIC PANEL - Abnormal; Notable for the following:    CO2 20 (*)    BUN 24 (*)    Creatinine, Ser 1.51 (*)    Calcium 8.7 (*)    GFR calc non Af Amer 34 (*)    GFR calc Af Amer 40 (*)    All other components within normal limits  RETICULOCYTES - Abnormal; Notable for the following:    RBC. 2.92 (*)    All other components within normal limits  POC OCCULT BLOOD,  ED - Abnormal; Notable for the following:    Fecal Occult Bld POSITIVE (*)    All other components within normal limits  VITAMIN B12  FOLATE  IRON AND TIBC  FERRITIN  TYPE AND SCREEN  PREPARE RBC (CROSSMATCH)    Imaging Review Dg Lumbar Spine 2-3 Views  12/14/2015  CLINICAL DATA:  Low back pain with standing and walking, difficulty standing for prolonged periods of time EXAM: LUMBAR SPINE - 2-3 VIEW COMPARISON:  None in PACs FINDINGS: There is anterior wedge compression of L1, L2 and L3. These compressions are of uncertain age. The loss of height anteriorly at L1 is approximately 20%. Of L2 of the anterior height loss is approximately 10%. At L3 the anterior height loss is approximately 15%. There is mild multilevel degenerative disc space narrowing of the lumbar spine. There is mild antro listhesis of L4 with respect L3 amounting to approximately 3 mm. There is facet joint hypertrophy at L5-S1. The pedicles and transverse processes are intact. IMPRESSION: There are mild wedge compressions of L1 through L3 as described. The age of these is unclear. There is multilevel degenerative disc space narrowing. Mild grade 1 anterolisthesis of L4 with respect L3 is present and likely secondary to degenerative disc and facet joint change. Electronically Signed   By: David  Martinique M.D.   On: 12/14/2015 12:25   I have personally reviewed and evaluated these images and  lab results as part of my medical decision-making.   EKG Interpretation None      MDM   Final diagnoses:  Iron deficiency anemia due to chronic blood loss    Severe anemia patient will be admitted and will be transfused blood    Milton Ferguson, MD 12/15/15 2034

## 2015-12-15 NOTE — Progress Notes (Signed)
Quick Note:  Please contact the patient regarding:Severe anemia. Needs transfusion. Also add Ferritin TIBC and iron levels. ______

## 2015-12-15 NOTE — ED Notes (Signed)
CRITICAL VALUE ALERT  Critical value received:  Hemoglobin 6.0  Date of notification:  12/15/15  Time of notification:  2000 hrs  Critical value read back:Yes.    Nurse who received alert:  Y. Ilona Colley, RN  MD notified (1st page):  Dr. Roderic Palau  Time of first page:  2000 hrs  Responding MD:  Dr. Roderic Palau  Time MD responded:  2001 hrs

## 2015-12-15 NOTE — ED Notes (Signed)
Pt was sent by PCP for Hemoglobin of 5.9, blood work was done yesterday. Pt states she feels tired and dizzy; denies any pain. Denies any blood in stools

## 2015-12-16 DIAGNOSIS — I10 Essential (primary) hypertension: Secondary | ICD-10-CM

## 2015-12-16 DIAGNOSIS — D500 Iron deficiency anemia secondary to blood loss (chronic): Secondary | ICD-10-CM

## 2015-12-16 DIAGNOSIS — N183 Chronic kidney disease, stage 3 (moderate): Secondary | ICD-10-CM

## 2015-12-16 DIAGNOSIS — D509 Iron deficiency anemia, unspecified: Secondary | ICD-10-CM

## 2015-12-16 DIAGNOSIS — N182 Chronic kidney disease, stage 2 (mild): Secondary | ICD-10-CM | POA: Diagnosis not present

## 2015-12-16 DIAGNOSIS — E039 Hypothyroidism, unspecified: Secondary | ICD-10-CM

## 2015-12-16 LAB — PROTIME-INR
INR: 1.14 (ref 0.00–1.49)
Prothrombin Time: 14.8 seconds (ref 11.6–15.2)

## 2015-12-16 LAB — IRON AND TIBC
Iron Saturation: 3 % — CL (ref 15–55)
Iron: 11 ug/dL — ABNORMAL LOW (ref 27–139)
Iron: 10 ug/dL — ABNORMAL LOW (ref 28–170)
Saturation Ratios: 2 % — ABNORMAL LOW (ref 10.4–31.8)
TIBC: 402 ug/dL (ref 250–450)
Total Iron Binding Capacity: 383 ug/dL (ref 250–450)
UIBC: 372 ug/dL — ABNORMAL HIGH (ref 118–369)
UIBC: 392 ug/dL

## 2015-12-16 LAB — TYPE AND SCREEN
ABO/RH(D): O POS
Antibody Screen: NEGATIVE
Unit division: 0
Unit division: 0

## 2015-12-16 LAB — COMPREHENSIVE METABOLIC PANEL
ALT: 9 U/L — ABNORMAL LOW (ref 14–54)
AST: 9 U/L — ABNORMAL LOW (ref 15–41)
Albumin: 3.1 g/dL — ABNORMAL LOW (ref 3.5–5.0)
Alkaline Phosphatase: 87 U/L (ref 38–126)
Anion gap: 8 (ref 5–15)
BUN: 21 mg/dL — ABNORMAL HIGH (ref 6–20)
CO2: 21 mmol/L — ABNORMAL LOW (ref 22–32)
Calcium: 8.7 mg/dL — ABNORMAL LOW (ref 8.9–10.3)
Chloride: 109 mmol/L (ref 101–111)
Creatinine, Ser: 1.32 mg/dL — ABNORMAL HIGH (ref 0.44–1.00)
GFR calc Af Amer: 47 mL/min — ABNORMAL LOW (ref >60)
GFR calc non Af Amer: 40 mL/min — ABNORMAL LOW (ref >60)
Glucose, Bld: 94 mg/dL (ref 65–99)
Potassium: 4.2 mmol/L (ref 3.5–5.1)
Sodium: 138 mmol/L (ref 135–145)
Total Bilirubin: 0.8 mg/dL (ref 0.3–1.2)
Total Protein: 6.1 g/dL — ABNORMAL LOW (ref 6.5–8.1)

## 2015-12-16 LAB — VITAMIN B12: Vitamin B-12: 300 pg/mL (ref 180–914)

## 2015-12-16 LAB — CBC
HCT: 27.4 % — ABNORMAL LOW (ref 36.0–46.0)
Hemoglobin: 8.4 g/dL — ABNORMAL LOW (ref 12.0–15.0)
MCH: 23 pg — ABNORMAL LOW (ref 26.0–34.0)
MCHC: 30.7 g/dL (ref 30.0–36.0)
MCV: 74.9 fL — ABNORMAL LOW (ref 78.0–100.0)
Platelets: 446 10*3/uL — ABNORMAL HIGH (ref 150–400)
RBC: 3.66 MIL/uL — ABNORMAL LOW (ref 3.87–5.11)
RDW: 16.3 % — ABNORMAL HIGH (ref 11.5–15.5)
WBC: 9.4 10*3/uL (ref 4.0–10.5)

## 2015-12-16 LAB — SPECIMEN STATUS REPORT

## 2015-12-16 LAB — FERRITIN
Ferritin: 5 ng/mL — ABNORMAL LOW (ref 15–150)
Ferritin: 3 ng/mL — ABNORMAL LOW (ref 11–307)

## 2015-12-16 LAB — FOLATE: Folate: 44.2 ng/mL (ref >5.9)

## 2015-12-16 MED ORDER — PEG 3350-KCL-NA BICARB-NACL 420 G PO SOLR
2000.0000 mL | Freq: Once | ORAL | Status: AC
  Administered 2015-12-16: 2000 mL via ORAL
  Filled 2015-12-16: qty 4000

## 2015-12-16 MED ORDER — PEG 3350-KCL-NA BICARB-NACL 420 G PO SOLR
2000.0000 mL | Freq: Once | ORAL | Status: AC
  Administered 2015-12-17: 2000 mL via ORAL
  Filled 2015-12-16: qty 4000

## 2015-12-16 MED ORDER — SODIUM CHLORIDE 0.9 % IV SOLN
510.0000 mg | Freq: Once | INTRAVENOUS | Status: AC
  Administered 2015-12-16: 510 mg via INTRAVENOUS
  Filled 2015-12-16: qty 17

## 2015-12-16 MED ORDER — PANTOPRAZOLE SODIUM 40 MG IV SOLR
40.0000 mg | INTRAVENOUS | Status: DC
  Administered 2015-12-16 – 2015-12-17 (×2): 40 mg via INTRAVENOUS
  Filled 2015-12-16 (×2): qty 40

## 2015-12-16 NOTE — Progress Notes (Signed)
[facility] PROGRESS NOTE  [name] [id number] DOB: 01-02-1946 DOA: 12/15/2015 PCP: Redge Gainer, MD  Assessment/Plan: 1. Severe iron deficiency anemia. Etiology is not entirely clear, although she does have heme positive stools. She does admit to using daily naproxen over the past month for her back pain. Gastroenterology has been consulted to further evaluate for any GI cause of anemia. She was transfused 2 units of PRBC with improvement of hemoglobin. Degenerative low iron, we will infuse one dose of IV iron. She will likely need to be discharged on oral iron replacement therapy. Repeat CBC in a.m. We'll keep the patient nothing by mouth after midnight in case any procedures are scheduled for tomorrow. Start the patient on PPI. 2. Hypothyroidism. Most recent TSH is noted to be elevated at 9.2. This has been trending down from previous levels of 28 and 74 in September and August respectively. We'll continue current therapy and have her follow-up with her primary care physician. 3. CKD stage III. Creatinine is currently at baseline. Continue to monitor. 4. Hypertension. Blood pressure currently stable. Continue to monitor.  Code Status: full code Family Communication: discussed with patient Disposition Plan: discharge home, possibly tomorrow   Consultants:  Gastroenterology  Procedures:    Antibiotics:    HPI/Subjective: Patient denies any chest pain or shortness of breath. She reports having a bowel movement earlier today which was reportedly normal color and contained no visible blood.  Objective: Filed Vitals:   12/16/15 0552 12/16/15 1341  BP: 167/78 146/70  Pulse: 90 99  Temp: 99.3 F (37.4 C) 99.3 F (37.4 C)  Resp: 16 20    Intake/Output Summary (Last 24 hours) at 12/16/15 1344 Last data filed at 12/16/15 1300  Gross per 24 hour  Intake   1140 ml  Output      0 ml  Net   1140 ml   Filed Weights   12/15/15 1849 12/15/15 2243  Weight: 70.761 kg  (156 lb) 71.3 kg (157 lb 3 oz)    Exam:   General:  NAD  Cardiovascular: s1, S2 RRR  Respiratory: CTA B  Abdomen: soft, nt, nd, bs+  Musculoskeletal:  No edema b/l   Data Reviewed: Basic Metabolic Panel:  Recent Labs Lab 12/14/15 1207 12/15/15 1910 12/16/15 0548  NA 135 137 138  K 4.7 4.3 4.2  CL 96 105 109  CO2 17* 20* 21*  GLUCOSE 91 96 94  BUN 23 24* 21*  CREATININE 1.38* 1.51* 1.32*  CALCIUM 9.0 8.7* 8.7*   Liver Function Tests:  Recent Labs Lab 12/14/15 1207 12/16/15 0548  AST 7 9*  ALT 9 9*  ALKPHOS 119* 87  BILITOT 0.2 0.8  PROT 6.3 6.1*  ALBUMIN 4.0 3.1*   No results for input(s): LIPASE, AMYLASE in the last 168 hours. No results for input(s): AMMONIA in the last 168 hours. CBC:  Recent Labs Lab 12/14/15 1207 12/15/15 1910 12/16/15 0548  WBC 14.9* 13.6* 9.4  NEUTROABS 12.3* 9.8*  --   HGB  --  6.0* 8.4*  HCT 20.4* 21.1* 27.4*  MCV  --  73.0* 74.9*  PLT  --  604* 446*   Cardiac Enzymes: No results for input(s): CKTOTAL, CKMB, CKMBINDEX, TROPONINI in the last 168 hours. BNP (last 3 results) No results for input(s): BNP in the last 8760 hours.  ProBNP (last 3 results) No results for input(s): PROBNP in the last 8760 hours.  CBG: No results for input(s): GLUCAP in the last 168 hours.  No results  found for this or any previous visit (from the past 240 hour(s)).   Studies: No results found.  Scheduled Meds: . DULoxetine  60 mg Oral Daily  . ferumoxytol  510 mg Intravenous Once  . folic acid  1 mg Oral Daily  . irbesartan  150 mg Oral Daily  . levothyroxine  100 mcg Oral QAC breakfast  . thiamine  100 mg Oral Daily   Continuous Infusions: . sodium chloride 75 mL/hr at 12/16/15 0003    Active Problems:   HTN (hypertension)   Anemia   B12 deficiency   Hypothyroidism   Chronic kidney disease (CKD), stage II (mild)    Time spent: 15mins    MEMON,JEHANZEB  [facility] Pager [phone]. If 7PM-7AM, please contact  night-coverage at [email or web address], password [facility] 12/16/2015, 1:44 PM  LOS: 1 day

## 2015-12-16 NOTE — Consult Note (Signed)
Referring Provider: No ref. provider found Primary Care Physician:  Redge Gainer, MD Primary Gastroenterologist:  Dr. Gala Romney  Date of Admission: 12/15/15 Date of Consultation: 12/16/15  Reason for Consultation:  Severe anemia  HPI:  70 year old female with a PMH of hypertension, B12 deficiency, Anemia, [place], Alcoholism,CKD, COPD presented to the ER 12/15/15 on referral from PCP office for anemia. She was seeing PCP for routine follow-up on hypothyroidism and labs done showed Hgb 5.9. At the time she denied any hematemesis, melena, rectal bleeding. Admitted abdominal pain and constipation, is on daily B12 and Thiamine supplementation for chronic anemia. Per records known chronic drinking with 7-9 shots of vodka daily but denied recent ETOH use on admission. Has recently been on indomethacin, ASA, and diclofenac. In the ER she did have heme+ stool. CBC in ER showed H/H 6.0/21.1, WBC 13.6, MCV 73. Ferritin 3, Iron 10, TIBC 402, Iron sat 2. BUn/Cr 24/1.51 which is increased from her baseline of 8/0.8,  INR 1.14. She received 2 units PRBC, recheck CBC this morning showed appropriate increase in H/H to 8.4/27.4. CMP this morning with slight improvement in renal function (BUN/Cr 21/1.32), low albumin 3.1, low AST/ALT (9/9). No stools documented on chart since admission.  Today she states when she thinks back she was having some dyspnea and dizzienss, but she attributed this to her COPD and was taking her inhaler (which she states wasn't helping.) Dizziness and dyspnea have resolved since receiving blood. Denies seeing any melena or hematochezia. States she had a large bowel movement this mornign and said it wasn't dark or red at all. She admits for back pain she has been taking Naproxen daily as well as diclofenac. Has not taken Indomethacin in several months. Has also been having lower abdominal pain, which she attirbutes to constipation having not had a bowel movement in 4 days. When she did have a bowel  movement this morning her abdominal pain resolved. Denies fever, chills, N/V, unintentional weight loss. Denies chest pain, syncope, near syncope. Denies any other upper or lower GI symptoms. Denies GERD or dyspepsia symptoms. She has never had a colonoscopy before. She has not had any alcohol in about 1 year "It made me a nasty person and I had no idea, so I quit drinking all together."  Past Medical History  Diagnosis Date  . Hypertension   . Menopause   . Hypothyroid   . Mini stroke ([place])   . Hyperlipidemia   . B12 deficiency   . Shortness of breath   . Headache(784.0)   . Anxiety   . Pneumonia   . Anemia   . Depression   . Stroke [facility]) 2010    no deficits  . Alcoholism ([place]) 01/09/2012  . Hyponatremia 01/09/2012  . Chronic kidney disease (CKD), stage II (mild)   . COPD (chronic obstructive pulmonary disease) ([place])   . Orthostatic syncope 10/02/2012  . Carotid artery narrowing 10/03/2012    On the right.    Past Surgical History  Procedure Laterality Date  . Appendectomy    . Tonsillectomy    . Cataract extraction w/phaco  06/20/2012    Procedure: CATARACT EXTRACTION PHACO AND INTRAOCULAR LENS PLACEMENT (IOC);  Surgeon: Tonny Branch, MD;  Location: [facility];  Service: Ophthalmology;  Laterality: Right;  CDE:10.66  . Cataract extraction w/phaco  07/11/2012    Procedure: CATARACT EXTRACTION PHACO AND INTRAOCULAR LENS PLACEMENT (IOC);  Surgeon: Tonny Branch, MD;  Location: [facility];  Service: Ophthalmology;  Laterality: Left;  CDE: 12.69  Prior to Admission medications   Medication Sig Start Date End Date Taking? Authorizing Provider  acetaminophen (TYLENOL) 500 MG tablet Take 1 tablet (500 mg total) by mouth every 6 (six) hours as needed for moderate pain. Pain 07/12/15  Yes Claretta Fraise, MD  allopurinol (ZYLOPRIM) 300 MG tablet Take 1 tablet (300 mg total) by mouth daily. Patient taking differently: Take 300 mg by mouth daily as needed (for gout flare).  09/11/14  Yes Lysbeth Penner,  FNP  aspirin EC 81 MG tablet Take 81 mg by mouth daily.   Yes Historical Provider, MD  diclofenac (VOLTAREN) 75 MG EC tablet TAKE ONE TABLET BY MOUTH TWICE DAILY 12/07/15  Yes Claretta Fraise, MD  DULoxetine (CYMBALTA) 60 MG capsule Take 1 capsule (60 mg total) by mouth daily. 07/12/15  Yes Claretta Fraise, MD  folic acid (FOLVITE) 1 MG tablet Take 1 tablet (1 mg total) by mouth daily. 07/12/15  Yes Claretta Fraise, MD  indomethacin (INDOCIN SR) 75 MG CR capsule Take 1 capsule (75 mg total) by mouth daily as needed (for gout). 04/29/15  Yes Ripudeep Krystal Eaton, MD  levothyroxine (SYNTHROID, LEVOTHROID) 100 MCG tablet Take 1 tablet (100 mcg total) by mouth daily. 09/10/15  Yes Claretta Fraise, MD  thiamine (VITAMIN B-1) 100 MG tablet Take 1 tablet (100 mg total) by mouth daily. 07/12/15  Yes Claretta Fraise, MD  valsartan (DIOVAN) 160 MG tablet Take 1 tablet (160 mg total) by mouth daily. 1 tablet daily x 1 wk Then 2 tablets daily Patient taking differently: Take 320 mg by mouth daily.  08/10/15  Yes Claretta Fraise, MD  cyclobenzaprine (FLEXERIL) 10 MG tablet Take 1 tablet (10 mg total) by mouth 3 (three) times daily as needed for muscle spasms. 12/14/15   Claretta Fraise, MD    Current Facility-Administered Medications  Medication Dose Route Frequency Provider Last Rate Last Dose  . 0.9 %  sodium chloride infusion   Intravenous Continuous Doree Albee, MD 75 mL/hr at 12/16/15 0003    . acetaminophen (TYLENOL) tablet 500 mg  500 mg Oral Q6H PRN Nimish Luther Parody, MD      . allopurinol (ZYLOPRIM) tablet 300 mg  300 mg Oral Daily PRN Nimish Luther Parody, MD      . cyclobenzaprine (FLEXERIL) tablet 10 mg  10 mg Oral TID PRN Nimish Luther Parody, MD      . DULoxetine (CYMBALTA) DR capsule 60 mg  60 mg Oral Daily Nimish C Anastasio Champion, MD   60 mg at 12/16/15 0913  . ferumoxytol (FERAHEME) 510 mg in sodium chloride 0.9 % 100 mL IVPB  510 mg Intravenous Once Kathie Dike, MD      . folic acid (FOLVITE) tablet 1 mg  1 mg Oral Daily Nimish  Luther Parody, MD   1 mg at 12/16/15 0913  . irbesartan (AVAPRO) tablet 150 mg  150 mg Oral Daily Doree Albee, MD   150 mg at 12/16/15 0913  . levothyroxine (SYNTHROID, LEVOTHROID) tablet 100 mcg  100 mcg Oral QAC breakfast Doree Albee, MD   100 mcg at 12/16/15 0913  . ondansetron (ZOFRAN) tablet 4 mg  4 mg Oral Q6H PRN Nimish Luther Parody, MD       Or  . ondansetron (ZOFRAN) injection 4 mg  4 mg Intravenous Q6H PRN Nimish C Gosrani, MD      . thiamine (VITAMIN B-1) tablet 100 mg  100 mg Oral Daily Nimish C Anastasio Champion, MD   100 mg at 12/16/15 [id number]  Allergies as of 12/15/2015 - Review Complete 12/15/2015  Allergen Reaction Noted  . Norvasc [amlodipine besylate] Hives 04/21/2011  . Procardia [nifedipine] Swelling 04/21/2011    Family History  Problem Relation Age of Onset  . GER disease Mother   . Hypertension Maternal Grandmother     Social History   Social History  . Marital Status: Widowed    Spouse Name: N/A  . Number of Children: N/A  . Years of Education: N/A   Occupational History  . Not on file.   Social History Main Topics  . Smoking status: Former Smoker -- 2.00 packs/day for 30 years    Types: Cigarettes    Quit date: 06/17/1992  . Smokeless tobacco: Not on file  . Alcohol Use: 7.0 oz/week    14 drink(s) per week  . Drug Use: No  . Sexual Activity: Not on file   Other Topics Concern  . Not on file   Social History Narrative    Review of Systems: Gen: Denies fever, chills, loss of appetite, change in weight or weight loss CV: Denies chest pain, heart palpitations, syncope, edema  Resp: Denies worsening cough, wheezing GI: See HPI.  Derm: Denies rash, itching, dry skin Psych: Denies depression, anxiety,confusion, or memory loss Heme: Denies bruising, bleeding.  Physical Exam: Vital signs in last 24 hours: Temp:  [98.3 F (36.8 C)-99.8 F (37.7 C)] 99.3 F (37.4 C) (01/05 1341) Pulse Rate:  [88-107] 99 (01/05 1341) Resp:  [15-20] 20 (01/05  1341) BP: (137-167)/(64-82) 146/70 mmHg (01/05 1341) SpO2:  [96 %-100 %] 100 % (01/05 1341) Weight:  [156 lb (70.761 kg)-157 lb 3 oz (71.3 kg)] 157 lb 3 oz (71.3 kg) (01/04 2243) Last BM Date: 12/12/15 General:   Alert,  Well-developed, well-nourished, pleasant and cooperative in NAD Head:  Normocephalic and atraumatic. Eyes:  Sclera clear, no icterus. Conjunctiva pink. Ears:  Normal auditory acuity. Neck:  Supple; no masses or thyromegaly. Lungs:  Clear throughout to auscultation.   No wheezes, crackles, or rhonchi. No acute distress. Heart:  Regular rate and rhythm; no murmurs, clicks, rubs,  or gallops. Abdomen:  Soft, nontender and nondistended. No masses, hepatosplenomegaly or hernias noted. Normal bowel sounds, without guarding, and without rebound.   Rectal:  Deferred until time of colonoscopy.   Pulses:  Normal DP pulses noted. Extremities:  Without clubbing or edema. Neurologic:  Alert and  oriented x4; grossly normal neurologically. Psych:  Alert and cooperative. Normal mood and affect.  Intake/Output from previous day: 01/04 0701 - 01/05 0700 In: 660 [Blood:660] Out: -  Intake/Output this shift: Total I/O In: 480 [P.O.:480] Out: -   Lab Results:  Recent Labs  12/14/15 1207 12/15/15 1910 12/16/15 0548  WBC 14.9* 13.6* 9.4  HGB  --  6.0* 8.4*  HCT 20.4* 21.1* 27.4*  PLT  --  604* 446*   BMET  Recent Labs  12/14/15 1207 12/15/15 1910 12/16/15 0548  NA 135 137 138  K 4.7 4.3 4.2  CL 96 105 109  CO2 17* 20* 21*  GLUCOSE 91 96 94  BUN 23 24* 21*  CREATININE 1.38* 1.51* 1.32*  CALCIUM 9.0 8.7* 8.7*   LFT  Recent Labs  12/14/15 1207 12/16/15 0548  PROT 6.3 6.1*  ALBUMIN 4.0 3.1*  AST 7 9*  ALT 9 9*  ALKPHOS 119* 87  BILITOT 0.2 0.8   PT/INR  Recent Labs  12/16/15 0548  LABPROT 14.8  INR 1.14   Hepatitis Panel No results for input(s): HEPBSAG, HCVAB,  HEPAIGM, HEPBIGM in the last 72 hours. C-Diff No results for input(s): CDIFFTOX in the  last 72 hours.  Studies/Results: No results found.  Impression: 70 year old female with new onset severe acute on chronic anemia. Has never had a colonoscopy before. No noted hematochezia or melena, but heme+ stool in the ER. Hgb on admission in the 5's, rise to 8's after 2 unites PRBCs. Symptomatic with her anemia including dyspnea and dizziness, both of which have resolved s/p transfusion. Abdominal pain associated with constipation with relief after large bowel movement this morning. She has been taking multiple NSAID medications for back pain including ASA, naproxen, and diclofenac. No other red flag/warning signs or symptoms.  GI bleed etiology most likely upper GI related with multiple NSAIDs, including erosive esophagitis, gastritis, gastric ulcer. Cannot rule out lower GI source at this time. Is past-due for initial colonoscopy. Is currently on a PPI in hospital.  Plan: 1. Continue daily PPI 2. Avoid all NSAIDs 3. Plan for colonoscopy with EGD in the morning with Dr. Gala Romney for further evaluation. Due to history of ETOH will add 12.5 mg preprocedure Phenergan. 4. Clear liquids today, NPO after midnight 5. Split bowel prep of Golytely 2L tonight, 2L early morning 6. Tap water enemas x 2 on call to procedure. 7. Montior for any obvious GI bleed. 8. Monitor H/H and transfuse as necessary.   Walden Field, AGNP-C Adult & Gerontological Nurse Practitioner [facility]    LOS: 1 day     12/16/2015, 1:47 PM

## 2015-12-16 NOTE — Care Management Note (Signed)
Case Management Note  Patient Details  Name: KATALYN CONSIGLIO MRN: [id number] Date of Birth: [birth year]  Subjective/Objective:                  Pt admitted from home with symptomatic anemia. Pt lives with her son and will return home at discharge. Pt is independent with ADL's. Pt has a cane and walker for home use.  Action/Plan: Will continue to follow for discharge planning needs.  Expected Discharge Date:  12/17/15               Expected Discharge Plan:  Home/Self Care  In-House Referral:  NA  Discharge planning Services  CM Consult  Post Acute Care Choice:  NA Choice offered to:  NA  DME Arranged:    DME Agency:     HH Arranged:    HH Agency:     Status of Service:  Completed, signed off  Medicare Important Message Given:    Date Medicare IM Given:    Medicare IM give by:    Date Additional Medicare IM Given:    Additional Medicare Important Message give by:     If discussed at [place] of Stay Meetings, dates discussed:    Additional Comments:  Joylene Draft, RN 12/16/2015, 3:12 PM

## 2015-12-17 ENCOUNTER — Encounter (HOSPITAL_COMMUNITY): Payer: Self-pay | Admitting: *Deleted

## 2015-12-17 ENCOUNTER — Encounter (HOSPITAL_COMMUNITY)
Admission: EM | Disposition: A | Discharge status: Home / Self Care | Payer: Self-pay | Source: Home / Self Care | Attending: Emergency Medicine

## 2015-12-17 DIAGNOSIS — K633 Ulcer of intestine: Secondary | ICD-10-CM | POA: Diagnosis not present

## 2015-12-17 DIAGNOSIS — K921 Melena: Secondary | ICD-10-CM | POA: Diagnosis not present

## 2015-12-17 DIAGNOSIS — K2901 Acute gastritis with bleeding: Secondary | ICD-10-CM

## 2015-12-17 DIAGNOSIS — K317 Polyp of stomach and duodenum: Secondary | ICD-10-CM

## 2015-12-17 DIAGNOSIS — K5730 Diverticulosis of large intestine without perforation or abscess without bleeding: Secondary | ICD-10-CM | POA: Diagnosis not present

## 2015-12-17 HISTORY — PX: ESOPHAGOGASTRODUODENOSCOPY: SHX5428

## 2015-12-17 HISTORY — PX: COLONOSCOPY: SHX5424

## 2015-12-17 LAB — CBC
HCT: 28.2 % — ABNORMAL LOW (ref 36.0–46.0)
Hemoglobin: 8.5 g/dL — ABNORMAL LOW (ref 12.0–15.0)
MCH: 22.5 pg — ABNORMAL LOW (ref 26.0–34.0)
MCHC: 30.1 g/dL (ref 30.0–36.0)
MCV: 74.8 fL — ABNORMAL LOW (ref 78.0–100.0)
Platelets: 457 10*3/uL — ABNORMAL HIGH (ref 150–400)
RBC: 3.77 MIL/uL — ABNORMAL LOW (ref 3.87–5.11)
RDW: 16.7 % — ABNORMAL HIGH (ref 11.5–15.5)
WBC: 7.3 10*3/uL (ref 4.0–10.5)

## 2015-12-17 LAB — BASIC METABOLIC PANEL
Anion gap: 9 (ref 5–15)
BUN: 13 mg/dL (ref 6–20)
CO2: 25 mmol/L (ref 22–32)
Calcium: 9.1 mg/dL (ref 8.9–10.3)
Chloride: 109 mmol/L (ref 101–111)
Creatinine, Ser: 1.06 mg/dL — ABNORMAL HIGH (ref 0.44–1.00)
GFR calc Af Amer: >60 mL/min (ref >60)
GFR calc non Af Amer: 53 mL/min — ABNORMAL LOW (ref >60)
Glucose, Bld: 92 mg/dL (ref 65–99)
Potassium: 4.3 mmol/L (ref 3.5–5.1)
Sodium: 143 mmol/L (ref 135–145)

## 2015-12-17 SURGERY — COLONOSCOPY
Anesthesia: Moderate Sedation

## 2015-12-17 MED ORDER — MIDAZOLAM HCL 5 MG/5ML IJ SOLN
INTRAMUSCULAR | Status: AC
  Filled 2015-12-17: qty 10

## 2015-12-17 MED ORDER — SODIUM CHLORIDE 0.9 % IJ SOLN
INTRAMUSCULAR | Status: AC
  Filled 2015-12-17: qty 3

## 2015-12-17 MED ORDER — FERROUS SULFATE 325 (65 FE) MG PO TABS: 325.0000 mg | ORAL_TABLET | Freq: Every day | ORAL | Status: DC

## 2015-12-17 MED ORDER — ONDANSETRON HCL 4 MG/2ML IJ SOLN
INTRAMUSCULAR | Status: DC | PRN
  Administered 2015-12-17: 4 mg via INTRAVENOUS

## 2015-12-17 MED ORDER — MIDAZOLAM HCL 5 MG/5ML IJ SOLN
INTRAMUSCULAR | Status: DC | PRN
  Administered 2015-12-17: 1 mg via INTRAVENOUS
  Administered 2015-12-17: 2 mg via INTRAVENOUS

## 2015-12-17 MED ORDER — PROMETHAZINE HCL 25 MG/ML IJ SOLN
12.5000 mg | Freq: Once | INTRAMUSCULAR | Status: AC
  Administered 2015-12-17: 12.5 mg via INTRAVENOUS

## 2015-12-17 MED ORDER — ONDANSETRON HCL 4 MG/2ML IJ SOLN
INTRAMUSCULAR | Status: AC
  Filled 2015-12-17: qty 2

## 2015-12-17 MED ORDER — LIDOCAINE VISCOUS 2 % MT SOLN
OROMUCOSAL | Status: AC
  Filled 2015-12-17: qty 15

## 2015-12-17 MED ORDER — SODIUM CHLORIDE 0.9 % IV SOLN
INTRAVENOUS | Status: DC
Start: 2015-12-17 — End: 2015-12-17
  Administered 2015-12-17: 10:00:00 via INTRAVENOUS

## 2015-12-17 MED ORDER — MEPERIDINE HCL 100 MG/ML IJ SOLN
INTRAMUSCULAR | Status: DC | PRN
  Administered 2015-12-17: 50 mg via INTRAVENOUS

## 2015-12-17 MED ORDER — OMEPRAZOLE 40 MG PO CPDR: 40.0000 mg | DELAYED_RELEASE_CAPSULE | Freq: Every day | ORAL | Status: DC

## 2015-12-17 MED ORDER — PROMETHAZINE HCL 25 MG/ML IJ SOLN
INTRAMUSCULAR | Status: AC
  Filled 2015-12-17: qty 1

## 2015-12-17 MED ORDER — LIDOCAINE VISCOUS 2 % MT SOLN
OROMUCOSAL | Status: DC | PRN
  Administered 2015-12-17: 1 via OROMUCOSAL

## 2015-12-17 MED ORDER — MEPERIDINE HCL 100 MG/ML IJ SOLN
INTRAMUSCULAR | Status: AC
  Filled 2015-12-17: qty 2

## 2015-12-17 NOTE — Op Note (Signed)
[facility] [address] [place], [address]   ENDOSCOPY PROCEDURE REPORT  PATIENT: [name], [name]  MR#: [id number] BIRTHDATE: 05-20-1946 , 69  yrs. old GENDER: female ENDOSCOPIST: R.  Garfield Cornea, MD FACP [facility] REFERRED BY:  Hospitalist PROCEDURE DATE:  Dec 27, 2015 PROCEDURE:  EGD w/ snare polypectomy INDICATIONS:  melena; Hemoccult positive stool MEDICATIONS: Versed 2 mg IV and Demerol 50 mg IV. Phenergan 12.5 mg IV. Xylocaine gel orally. Zofran 4 mg IV. ASA CLASS:      2  CONSENT: The risks, benefits, limitations, alternatives and imponderables have been discussed.  The potential for biopsy, esophogeal dilation, etc. have also been reviewed.  Questions have been answered.  All parties agreeable.  Please see the history and physical in the medical record for more information.  DESCRIPTION OF PROCEDURE: After the risks benefits and alternatives of the procedure were thoroughly explained, informed consent was obtained.  The [id number] endoscope was introduced through the mouth and advanced to the second portion of the duodenum , limited by Without limitations. The instrument was slowly withdrawn as the mucosa was fully examined. Estimated blood loss is zero unless otherwise noted in this procedure report.    Normal appearing esophageal mucosa.  Stomach empty.  In the antrum there was a 1 cm pedunculated polyp with superficial ulceration on the head.  Please see photos.  No peptic ulcer seen.  No infiltrating process seen.  Patent pylorus.  Normal-appearing first and second portion of the duodenum.    (2) 360 hemostasis clips were placed on the gastric polyp stalk. Subsequently, the polyp was removed in one pass with hot snare cautery.  It was recovered with a Jabier Mutton net.  Retroflexed views revealed no abnormalities.     The scope was then withdrawn from the patient and the procedure completed.  COMPLICATIONS: There were no immediate  complications.  ENDOSCOPIC IMPRESSION: Ulcerated gastric polyp?"removed as described above  RECOMMENDATIONS: Follow-up pathology. No MRI in the future until clips gone.  See colonoscopy report.  REPEAT EXAM:  eSigned:  R. Garfield Cornea, MD Rosalita Chessman [facility] 2015/12/27 11:09 AM    CC:  CPT CODES: ICD CODES:  The ICD and CPT codes recommended by this software are interpretations from the data that the clinical staff has captured with the software.  The verification of the translation of this report to the ICD and CPT codes and modifiers is the sole responsibility of the health care institution and practicing physician where this report was generated.  [employer]. will not be held responsible for the validity of the ICD and CPT codes included on this report.  AMA assumes no liability for data contained or not contained herein. CPT is a Designer, television/film set of the [employer].  PATIENT NAME:  [name], [name] MR#: [id number]

## 2015-12-17 NOTE — Discharge Summary (Signed)
. Physician Discharge Summary  [name] [id number] DOB: 04-Dec-1946 DOA: 12/15/2015  PCP: Redge Gainer, MD  Admit date: 12/15/2015 Discharge date: 12/17/2015  Time spent: 30 minutes  Recommendations for Outpatient Follow-up:  1. Started Prilosec/Iron pills this admit 2. No NSAIDS 3. Needs OP f/u GI Dr. Gala Romney consideration Capsule endo  Discharge Diagnoses:  Active Problems:   HTN (hypertension)   Anemia   B12 deficiency   Hypothyroidism   Chronic kidney disease (CKD), stage II (mild)   Iron deficiency anemia due to chronic blood loss   Colonic ulcer   Diverticulosis of colon without hemorrhage   Discharge Condition: imporved  Diet recommendation: soft initally then reg diet  Filed Weights   12/15/15 1849 12/15/15 2243 12/17/15 0955  Weight: 70.761 kg (156 lb) 71.3 kg (157 lb 3 oz) 71.215 kg (157 lb)    History of present illness:  70 year old ? Known history chronic back pain Hypothyroidism Hypertension Stroke 2010, TIA 10/23/1999 14 COPD CK D stage II Cataract extractions in 2013 Reported carotid artery stenosis 50-69% 201 Orthostatic syncope Has been admitted in the past with acute encephalopathy? Alcohol withdrawal delirium-prior chronic drinker 7-9 shots of vodka daily basis   admitted from PCP office 12/16/15 with weakness, tiredness severe anemia in the 5 range Note that patient has been on on indomethacin, aspirin, diclofenac for back pain  Hospital Course:   1. acute iron deficiency anemia 2/2 to  Upper GI bleed- using daily naproxen over the past month for her back pain. transfused 2 units of PRBC with improvement of hemoglobin-->8.5 on 12/17/15.2/2 was infused one dose of IV iron. discharge on oral iron replacement therapy. Continue PPI. Capsule as per GI as OP. BUn/Creat elevation on admit suggestive ? U GIB source. Advanced per GI soft diet and ? Graduate in am  2. Hypothyroidism. Most recent TSH is noted to be elevated at 9.2. This has been  trending down from previous levels of 28 and 74 in September and August respectively. follow-up with her primary care physician. 3. CKD stage III. Creatinine is currently at baseline. 4. Hypertension. Blood pressure currently stable.  Discharge Exam: Filed Vitals:   12/17/15 1200 12/17/15 1234  BP:  135/62  Pulse:  83  Temp:  98.4 F (36.9 C)  Resp: 11 14    General: eomi ncat Cardiovascular: s1 s2 no m/r/g Respiratory: clear no added sound   Discharge Instructions   Discharge Instructions    Diet - low sodium heart healthy    Complete by:  As directed      Discharge instructions    Complete by:  As directed   Take prilosec 40 daily Take iron tablets daily Follow up with Dr. Gala Romney of GI as an outpatient and he will discuss with you further  NO NSAIDS going forward     Increase activity slowly    Complete by:  As directed           Current Discharge Medication List    START taking these medications   Details  ferrous sulfate 325 (65 FE) MG tablet Take 1 tablet (325 mg total) by mouth daily with breakfast. Qty: 30 tablet, Refills: 0    omeprazole (PRILOSEC) 40 MG capsule Take 1 capsule (40 mg total) by mouth daily. Qty: 30 capsule, Refills: 2      CONTINUE these medications which have NOT CHANGED   Details  acetaminophen (TYLENOL) 500 MG tablet Take 1 tablet (500 mg total) by mouth every 6 (six) hours as  needed for moderate pain. Pain Qty: 90 tablet, Refills: 4    allopurinol (ZYLOPRIM) 300 MG tablet Take 1 tablet (300 mg total) by mouth daily. Qty: 30 tablet, Refills: 11   Associated Diagnoses: Acute gout of left hip, unspecified cause    DULoxetine (CYMBALTA) 60 MG capsule Take 1 capsule (60 mg total) by mouth daily. Qty: 90 capsule, Refills: 3   Associated Diagnoses: Depression    folic acid (FOLVITE) 1 MG tablet Take 1 tablet (1 mg total) by mouth daily. Qty: 90 tablet, Refills: 3   Associated Diagnoses: Alcoholism ([place])    levothyroxine (SYNTHROID,  LEVOTHROID) 100 MCG tablet Take 1 tablet (100 mcg total) by mouth daily. Qty: 90 tablet, Refills: 3    thiamine (VITAMIN B-1) 100 MG tablet Take 1 tablet (100 mg total) by mouth daily. Qty: 90 tablet, Refills: 3   Associated Diagnoses: Alcoholism ([place])    valsartan (DIOVAN) 160 MG tablet Take 1 tablet (160 mg total) by mouth daily. 1 tablet daily x 1 wk Then 2 tablets daily Qty: 60 tablet, Refills: 5   Associated Diagnoses: Essential hypertension    cyclobenzaprine (FLEXERIL) 10 MG tablet Take 1 tablet (10 mg total) by mouth 3 (three) times daily as needed for muscle spasms. Qty: 90 tablet, Refills: 1      STOP taking these medications     aspirin EC 81 MG tablet      diclofenac (VOLTAREN) 75 MG EC tablet      indomethacin (INDOCIN SR) 75 MG CR capsule        Allergies  Allergen Reactions  . Norvasc [Amlodipine Besylate] Hives  . Procardia [Nifedipine] Swelling    Edema        The results of significant diagnostics from this hospitalization (including imaging, microbiology, ancillary and laboratory) are listed below for reference.    Significant Diagnostic Studies: Dg Lumbar Spine 2-3 Views  12/14/2015  CLINICAL DATA:  Low back pain with standing and walking, difficulty standing for prolonged periods of time EXAM: LUMBAR SPINE - 2-3 VIEW COMPARISON:  None in PACs FINDINGS: There is anterior wedge compression of L1, L2 and L3. These compressions are of uncertain age. The loss of height anteriorly at L1 is approximately 20%. Of L2 of the anterior height loss is approximately 10%. At L3 the anterior height loss is approximately 15%. There is mild multilevel degenerative disc space narrowing of the lumbar spine. There is mild antro listhesis of L4 with respect L3 amounting to approximately 3 mm. There is facet joint hypertrophy at L5-S1. The pedicles and transverse processes are intact. IMPRESSION: There are mild wedge compressions of L1 through L3 as described. The age of these is  unclear. There is multilevel degenerative disc space narrowing. Mild grade 1 anterolisthesis of L4 with respect L3 is present and likely secondary to degenerative disc and facet joint change. Electronically Signed   By: David  Martinique M.D.   On: 12/14/2015 12:25    Microbiology: No results found for this or any previous visit (from the past 240 hour(s)).   Labs: Basic Metabolic Panel:  Recent Labs Lab 12/14/15 1207 12/15/15 1910 12/16/15 0548 12/17/15 0554  NA 135 137 138 143  K 4.7 4.3 4.2 4.3  CL 96 105 109 109  CO2 17* 20* 21* 25  GLUCOSE 91 96 94 92  BUN 23 24* 21* 13  CREATININE 1.38* 1.51* 1.32* 1.06*  CALCIUM 9.0 8.7* 8.7* 9.1   Liver Function Tests:  Recent Labs Lab 12/14/15 1207 12/16/15 0548  AST 7 9*  ALT 9 9*  ALKPHOS 119* 87  BILITOT 0.2 0.8  PROT 6.3 6.1*  ALBUMIN 4.0 3.1*   No results for input(s): LIPASE, AMYLASE in the last 168 hours. No results for input(s): AMMONIA in the last 168 hours. CBC:  Recent Labs Lab 12/14/15 1207 12/15/15 1910 12/16/15 0548 12/17/15 0554  WBC 14.9* 13.6* 9.4 7.3  NEUTROABS 12.3* 9.8*  --   --   HGB  --  6.0* 8.4* 8.5*  HCT 20.4* 21.1* 27.4* 28.2*  MCV 69* 73.0* 74.9* 74.8*  PLT 581* 604* 446* 457*   Cardiac Enzymes: No results for input(s): CKTOTAL, CKMB, CKMBINDEX, TROPONINI in the last 168 hours. BNP: BNP (last 3 results) No results for input(s): BNP in the last 8760 hours.  ProBNP (last 3 results) No results for input(s): PROBNP in the last 8760 hours.  CBG: No results for input(s): GLUCAP in the last 168 hours.     SignedNita Sells MD   [facility] 12/17/2015, 3:11 PM

## 2015-12-17 NOTE — Care Management Note (Signed)
Case Management Note  Patient Details  Name: [name] MRN: [id number] Date of Birth: 1946-11-04  Subjective/Objective:                    Action/Plan:   Expected Discharge Date:  12/17/15               Expected Discharge Plan:  Home/Self Care  In-House Referral:  NA  Discharge planning Services  CM Consult  Post Acute Care Choice:  NA Choice offered to:  NA  DME Arranged:    DME Agency:     HH Arranged:    HH Agency:     Status of Service:  Completed, signed off  Medicare Important Message Given:    Date Medicare IM Given:    Medicare IM give by:    Date Additional Medicare IM Given:    Additional Medicare Important Message give by:     If discussed at [place] of Stay Meetings, dates discussed:    Additional Comments: Pt discharged home today. No CM needs noted. Pt changed to OBS and CC 44 given. Christinia Gully [place], RN 12/17/2015, 3:31 PM

## 2015-12-17 NOTE — Progress Notes (Signed)
Discharge instructions read to pt and family. Both verbalized understanding of all instructions.  Discharged to home with family.

## 2015-12-17 NOTE — Clinical Documentation Improvement (Signed)
Internal Medicine  Can the diagnosis of anemia be further specified? More than one type of anemia can be documented. Please document response in next progress note; NOT in BPA drop down list. Thanks!   Iron deficiency Anemia as documented  Acute on Chronic Blood Loss Anemia  Nutritional anemia, including the nutrition or mineral deficits  Chronic Blood Loss Anemia, including the suspected or known cause  Anemia of chronic disease, including the associated chronic disease state  Other  Clinically Undetermined  Document any associated diagnoses/conditions.  Supporting Information:  Initial H&H was 5.9 / 20  Transfused 2 units PRBC's  Repeat draw was 8.5/28  Please exercise your independent, professional judgment when responding. A specific answer is not anticipated or expected.  Thank You,  Zoila Shutter RN, BSN, [place] [phone]; Cell: [phone]

## 2015-12-17 NOTE — Care Management Obs Status (Signed)
[place] NOTIFICATION   Patient Details  Name: [name] MRN: [id number] Date of Birth: [birth year]   Medicare Observation Status Notification Given:  Yes    Joylene Draft, RN 12/17/2015, 3:31 PM

## 2015-12-17 NOTE — Plan of Care (Signed)
Problem: Bowel/Gastric: Goal: Will not experience complications related to bowel motility Outcome: Completed/Met Date Met:  12/17/15 Bowel pre today

## 2015-12-17 NOTE — Op Note (Signed)
[facility] [address] [place], [address]   COLONOSCOPY PROCEDURE REPORT  PATIENT: [name], [name]  MR#: [id number] BIRTHDATE: [birth year] , 69  yrs. old GENDER: female ENDOSCOPIST: R.  Garfield Cornea, MD FACP [facility] REFERRED BY:   Hospitalist PROCEDURE DATE:  2016-01-15 PROCEDURE:   Colonoscopy with biopsy INDICATIONS:    profound anemia; Hemoccult-positive stool. MEDICATIONS: Versed 3 mg IV and Demerol 50 mg IV in divided doses. Phenergan 12.5 mg IV.  Zofran 4 mg IV. ASA CLASS:       Class II  CONSENT: The risks, benefits, alternatives and imponderables including but not limited to bleeding, perforation as well as the possibility of a missed lesion have been reviewed.  The potential for biopsy, lesion removal, etc. have also been discussed. Questions have been answered.  All parties agreeable.  Please see the history and physical in the medical record for more information.  DESCRIPTION OF PROCEDURE:   After the risks benefits and alternatives of the procedure were thoroughly explained, informed consent was obtained.  The digital rectal exam revealed no abnormalities of the rectum.   The [id number]  endoscope was introduced through the anus and advanced to the cecum, which was identified by both the appendix and ileocecal valve. No adverse events experienced.   The quality of the prep was adequate  The instrument was then slowly withdrawn as the colon was fully examined. Estimated blood loss is zero unless otherwise noted in this procedure report.      COLON FINDINGS: Normal-appearing rectal mucosa side from internal hemorrhoids.  Markedly redundant, elongated colon.  Scattered pancolonic diverticula; The colonic mucosa appeared normal except for the proximal ascending colon/ileocecal valve and cecal area. There is marked circumferential prescription ulceration in this segment.  Marked deformity of the ileocecal valve.  I could not intubate it.   Please see multiple above photographs.  Multiple biopsies taken.  I did not see a neoplastic process.  Retroflexion was performed. .  Withdrawal time=12 minutes 0 seconds.  The scope was withdrawn and the procedure completed. COMPLICATIONS: There were no immediate complications. EBL 5 mL ENDOSCOPIC IMPRESSION: Marked ascending colon/ileocecal valve ulceration. Pancolonic diverticulosis. Redundant colon. Status post colonic biopsy. Patient without significant abdominal pain, diarrhea or weight loss making today's abnormal colonoscopy findings less likely due to ischemia. I suspect ulceration is due to underlying Crohn's disease with superimposed NSAID effect or NSAID effect alone  RECOMMENDATIONS: Low residue diet. Absolutely refrain from all NSAIDs. Bring the patient back for a capsule study of the small intestine in 8-12 weeks preceded by a patency study. Follow-up on pathology. See EGD report.  eSigned:  R. Garfield Cornea, MD Rosalita Chessman [facility] January 15, 2016 11:56 AM   cc:  CPT CODES: ICD CODES:  The ICD and CPT codes recommended by this software are interpretations from the data that the clinical staff has captured with the software.  The verification of the translation of this report to the ICD and CPT codes and modifiers is the sole responsibility of the health care institution and practicing physician where this report was generated.  [employer]. will not be held responsible for the validity of the ICD and CPT codes included on this report.  AMA assumes no liability for data contained or not contained herein. CPT is a Designer, television/film set of the [employer].  PATIENT NAME:  Kelsey, Gross MR#: [id number]

## 2015-12-17 NOTE — Progress Notes (Signed)
[facility] PROGRESS NOTE  YOSHIKA PESANTES [id number] DOB: [birth year] DOA: 12/15/2015 PCP: Redge Gainer, MD  70 year old ? Known history chronic back pain Hypothyroidism Hypertension Stroke 2010, TIA 10/23/1999 14 COPD CK D stage II Cataract extractions in 2013 Reported carotid artery stenosis 50-69% 201 Orthostatic syncope Has been admitted in the past with acute encephalopathy? Alcohol withdrawal delirium-prior chronic drinker 7-9 shots of vodka daily basis   admitted from PCP office 12/16/15 with weakness, tiredness severe anemia in the 5 range Note that patient has been on on indomethacin, aspirin, diclofenac for back pain  Assessment/Plan: 1. Severe iron deficiency anemia. Etiology is not entirely clear, although she does have heme positive stools. She does admit to using daily naproxen over the past month for her back pain. transfused 2 units of PRBC with improvement of hemoglobin-->8.5 on 12/17/15.2/2 was infused one dose of IV iron. discharge on oral iron replacement therapy. Continue PPI.  Capsule as per GI as OP.  BUn/Creat elevation on admit suggestive ? U GIB source.  Advanced per GI soft diet and ? Graduate in am  2. Hypothyroidism. Most recent TSH is noted to be elevated at 9.2. This has been trending down from previous levels of 28 and 74 in September and August respectively. follow-up with her primary care physician. 3. CKD stage III. Creatinine is currently at baseline.  monitor. 4. Hypertension. Blood pressure currently stable.  monitor.  Code Status: full code Family Communication: discussed with patient Disposition Plan: discharge home, possibly tomorrow   Consultants:  Gastroenterology  Procedures: Endoscopy 1.6 Colonoscopy 1.6   Antibiotics:    HPI/Subjective:  Sleepy Just back from Endo  No pain Hasn;t eaten yet  Objective: Filed Vitals:   12/17/15 1200 12/17/15 1234  BP:  135/62  Pulse:  83  Temp:  98.4 F (36.9 C)  Resp: 11 14     Intake/Output Summary (Last 24 hours) at 12/17/15 1322 Last data filed at 12/17/15 0850  Gross per 24 hour  Intake    900 ml  Output      0 ml  Net    900 ml   Filed Weights   12/15/15 1849 12/15/15 2243 12/17/15 0955  Weight: 70.761 kg (156 lb) 71.3 kg (157 lb 3 oz) 71.215 kg (157 lb)    Exam:   General:  NAD  Cardiovascular: s1, S2 RRR  Respiratory: CTA B  Abdomen: soft, nt, nd, bs+  Musculoskeletal:  No edema b/l   Data Reviewed: Basic Metabolic Panel:  Recent Labs Lab 12/14/15 1207 12/15/15 1910 12/16/15 0548 12/17/15 0554  NA 135 137 138 143  K 4.7 4.3 4.2 4.3  CL 96 105 109 109  CO2 17* 20* 21* 25  GLUCOSE 91 96 94 92  BUN 23 24* 21* 13  CREATININE 1.38* 1.51* 1.32* 1.06*  CALCIUM 9.0 8.7* 8.7* 9.1   Liver Function Tests:  Recent Labs Lab 12/14/15 1207 12/16/15 0548  AST 7 9*  ALT 9 9*  ALKPHOS 119* 87  BILITOT 0.2 0.8  PROT 6.3 6.1*  ALBUMIN 4.0 3.1*   No results for input(s): LIPASE, AMYLASE in the last 168 hours. No results for input(s): AMMONIA in the last 168 hours. CBC:  Recent Labs Lab 12/14/15 1207 12/15/15 1910 12/16/15 0548 12/17/15 0554  WBC 14.9* 13.6* 9.4 7.3  NEUTROABS 12.3* 9.8*  --   --   HGB  --  6.0* 8.4* 8.5*  HCT 20.4* 21.1* 27.4* 28.2*  MCV 69* 73.0* 74.9* 74.8*  PLT 581* 604*  446* 457*   Cardiac Enzymes: No results for input(s): CKTOTAL, CKMB, CKMBINDEX, TROPONINI in the last 168 hours. BNP (last 3 results) No results for input(s): BNP in the last 8760 hours.  ProBNP (last 3 results) No results for input(s): PROBNP in the last 8760 hours.  CBG: No results for input(s): GLUCAP in the last 168 hours.  No results found for this or any previous visit (from the past 240 hour(s)).   Studies: No results found.  Scheduled Meds: . DULoxetine  60 mg Oral Daily  . folic acid  1 mg Oral Daily  . irbesartan  150 mg Oral Daily  . levothyroxine  100 mcg Oral QAC breakfast  . lidocaine      . meperidine       . midazolam      . ondansetron      . pantoprazole (PROTONIX) IV  40 mg Intravenous Q24H  . promethazine      . sodium chloride      . sodium chloride      . thiamine  100 mg Oral Daily   Continuous Infusions: . sodium chloride 75 mL/hr at 12/17/15 1213    Active Problems:   HTN (hypertension)   Anemia   B12 deficiency   Hypothyroidism   Chronic kidney disease (CKD), stage II (mild)   Iron deficiency anemia due to chronic blood loss   Colonic ulcer   Diverticulosis of colon without hemorrhage    Time spent: 29mins    Verneita Griffes, MD [facility] [phone]

## 2015-12-21 ENCOUNTER — Encounter (HOSPITAL_COMMUNITY): Payer: Self-pay | Admitting: Internal Medicine

## 2015-12-21 ENCOUNTER — Telehealth: Payer: Self-pay | Admitting: Family Medicine

## 2015-12-21 MED ORDER — LEVOTHYROXINE SODIUM 125 MCG PO TABS: 125.0000 ug | ORAL_TABLET | Freq: Every day | ORAL | Status: DC

## 2015-12-21 NOTE — Telephone Encounter (Signed)
Patient is calling requesting the rest of her lab results. She knows about the anemia and had the transfusion but hospital Dr said that her thyroid medication may need adjusting. Will you please review.

## 2015-12-21 NOTE — Telephone Encounter (Signed)
Patient aware of results.

## 2015-12-21 NOTE — Telephone Encounter (Signed)
Patient was seen on 1/3

## 2015-12-21 NOTE — Telephone Encounter (Signed)
Yes, now that her anemia has been taken care of I would like to increase her levothyroxine. Her level is still showing dose is too low, but far better than it has been the last two previous checks. Scrip sent to pharmacy for 125 mcg.

## 2015-12-23 NOTE — Addendum Note (Signed)
Addended by: Thana Ates on: 12/23/2015 02:24 PM   Modules accepted: Orders

## 2015-12-26 ENCOUNTER — Encounter: Payer: Self-pay | Admitting: Internal Medicine

## 2015-12-27 ENCOUNTER — Encounter: Payer: Self-pay | Admitting: Internal Medicine

## 2015-12-27 ENCOUNTER — Other Ambulatory Visit: Payer: Self-pay

## 2015-12-27 ENCOUNTER — Telehealth: Payer: Self-pay

## 2015-12-27 DIAGNOSIS — D509 Iron deficiency anemia, unspecified: Secondary | ICD-10-CM

## 2015-12-27 NOTE — Telephone Encounter (Signed)
Letter and lab orders mailed to the pt.  

## 2015-12-27 NOTE — Telephone Encounter (Signed)
APPT MADE AND LETTER SENT  °

## 2015-12-27 NOTE — Telephone Encounter (Signed)
Send letter to patient.  Send copy of letter with path to referring provider and PCP.   Pt needs ov w extender in 4 weeks w cbc

## 2016-01-03 ENCOUNTER — Telehealth: Payer: Self-pay

## 2016-01-03 ENCOUNTER — Telehealth: Payer: Self-pay | Admitting: Family Medicine

## 2016-01-03 MED ORDER — SUCRALFATE 1 GM/10ML PO SUSP: 1.0000 g | Freq: Four times a day (QID) | ORAL | Status: DC

## 2016-01-03 NOTE — Telephone Encounter (Signed)
Stp and she states she wasn't having this pain prior to the colonoscopy and the path on the polyp came back normal per pt. Advised pt to call their office and talk to the nurse to see what they advise. If they advise her to be seen by PCP then she should CB to schedule. Pt voiced understanding.

## 2016-01-03 NOTE — Telephone Encounter (Signed)
Pt is aware. rx sent to the pharmacy. 

## 2016-01-03 NOTE — Telephone Encounter (Signed)
Pain reported to be mild only intermittent and sometimes after she eats. No other associated symptoms as outlined. She's been going about her business. Procedure notes path reviewed.  We'll try a trial of Carafate 1 g orally 4 times a day 5 days. No refills. If pain worsens or doesn't settle down in the next 2448 hrs., she needs to let us know. If it becomes severe she should go to the emergency department for evaluation.

## 2016-01-03 NOTE — Telephone Encounter (Signed)
Pt called- she had her tcs/egd on 12/27/15. She has been having abd pain, close to her navel that feels like it comes and goes but it gets worse after she eats. She has not noticed any blood, her bms are normal now. No fever. She has an appt to come back for an ov on 01/27/16.   Pt wants to know if there is anything she can do until her ov?

## 2016-01-17 ENCOUNTER — Other Ambulatory Visit: Payer: Self-pay | Admitting: Family Medicine

## 2016-01-24 DIAGNOSIS — D509 Iron deficiency anemia, unspecified: Secondary | ICD-10-CM | POA: Diagnosis not present

## 2016-01-25 LAB — CBC WITH DIFFERENTIAL/PLATELET
Basophils Absolute: 0.1 10*3/uL (ref 0.0–0.1)
Basophils Relative: 1 % (ref 0–1)
Eosinophils Absolute: 0.3 10*3/uL (ref 0.0–0.7)
Eosinophils Relative: 5 % (ref 0–5)
HCT: 35.8 % — ABNORMAL LOW (ref 36.0–46.0)
Hemoglobin: 11.1 g/dL — ABNORMAL LOW (ref 12.0–15.0)
Lymphocytes Relative: 17 % (ref 12–46)
Lymphs Abs: 1.1 10*3/uL (ref 0.7–4.0)
MCH: 25.6 pg — ABNORMAL LOW (ref 26.0–34.0)
MCHC: 31 g/dL (ref 30.0–36.0)
MCV: 82.5 fL (ref 78.0–100.0)
MPV: 9.1 fL (ref 8.6–12.4)
Monocytes Absolute: 0.5 10*3/uL (ref 0.1–1.0)
Monocytes Relative: 8 % (ref 3–12)
Neutro Abs: 4.5 10*3/uL (ref 1.7–7.7)
Neutrophils Relative %: 69 % (ref 43–77)
Platelets: 265 10*3/uL (ref 150–400)
RBC: 4.34 MIL/uL (ref 3.87–5.11)
RDW: 24.5 % — ABNORMAL HIGH (ref 11.5–15.5)
WBC: 6.5 10*3/uL (ref 4.0–10.5)

## 2016-01-26 ENCOUNTER — Ambulatory Visit (INDEPENDENT_AMBULATORY_CARE_PROVIDER_SITE_OTHER): Payer: Medicare Other | Admitting: Family Medicine

## 2016-01-26 VITALS — BP 123/72 | HR 96 | Temp 96.9°F | Ht 68.0 in | Wt 156.0 lb

## 2016-01-26 DIAGNOSIS — M545 Low back pain, unspecified: Secondary | ICD-10-CM

## 2016-01-26 DIAGNOSIS — K219 Gastro-esophageal reflux disease without esophagitis: Secondary | ICD-10-CM

## 2016-01-26 DIAGNOSIS — I10 Essential (primary) hypertension: Secondary | ICD-10-CM

## 2016-01-26 MED ORDER — RABEPRAZOLE SODIUM 20 MG PO TBEC: 20.0000 mg | DELAYED_RELEASE_TABLET | Freq: Two times a day (BID) | ORAL | Status: DC

## 2016-01-26 NOTE — Progress Notes (Signed)
Subjective:  Patient ID: Karenann Cai, female    DOB: [birth year]  Age: 70 y.o. MRN: [id number]  CC: Back Pain   HPI MIKAL FERRING presents for pain is 505 better, but not resolved. Able to do most activities but still some bending is painful. Moderate intensity. Not lifting anything over 5-10 lbs currently.  Patient in for follow-up of GERD. Currently symptomatic taking  PPI daily. T Has been using BID in the past. There is intermittent moderate heartburn. No hematemesis and no melena. Frequent dysphagia without choking. Onset is remote. Progression is stable.  Had EGD with dilation yesterday. Complicating factors, none.    History Maralyn has a past medical history of Hypertension; Menopause; Hypothyroid; Mini stroke ([place]); Hyperlipidemia; B12 deficiency; Shortness of breath; Headache(784.0); Anxiety; Pneumonia; Anemia; Depression; Stroke [facility]) (2010); Alcoholism ([place]) (01/09/2012); Hyponatremia (01/09/2012); Chronic kidney disease (CKD), stage II (mild); COPD (chronic obstructive pulmonary disease) ([place]); Orthostatic syncope (10/02/2012); and Carotid artery narrowing (10/03/2012).   She has past surgical history that includes Appendectomy; Tonsillectomy; Cataract extraction w/PHACO (06/20/2012); Cataract extraction w/PHACO (07/11/2012); Colonoscopy (N/A, 12/17/2015); and Esophagogastroduodenoscopy (N/A, 12/17/2015).   Her family history includes GER disease in her mother; Hypertension in her maternal grandmother.She reports that she quit smoking about 23 years ago. Her smoking use included Cigarettes. She has a 60 pack-year smoking history. She does not have any smokeless tobacco history on file. She reports that she drinks about 7.0 oz of alcohol per week. She reports that she does not use illicit drugs.    ROS Review of Systems  Constitutional: Negative for fever, activity change and appetite change.  HENT: Negative for congestion, rhinorrhea and sore throat.   Eyes: Negative for visual  disturbance.  Respiratory: Negative for cough and shortness of breath.   Cardiovascular: Negative for chest pain and palpitations.  Gastrointestinal: Positive for nausea and abdominal pain. Negative for diarrhea.  Genitourinary: Negative for dysuria.  Musculoskeletal: Positive for back pain and arthralgias. Negative for myalgias.    Objective:  BP 123/72 mmHg  Pulse 96  Temp(Src) 96.9 F (36.1 C) (Oral)  Ht 5\' 8"  (1.727 m)  Wt 156 lb (70.761 kg)  BMI 23.73 kg/m2  SpO2 100%  LMP 08/21/2013  BP Readings from Last 3 Encounters:  01/26/16 123/72  12/17/15 135/62  12/14/15 104/54    Wt Readings from Last 3 Encounters:  01/26/16 156 lb (70.761 kg)  12/17/15 157 lb (71.215 kg)  12/14/15 158 lb 6 oz (71.838 kg)     Physical Exam  Constitutional: She is oriented to person, place, and time. She appears well-developed and well-nourished. No distress.  HENT:  Head: Normocephalic and atraumatic.  Eyes: Conjunctivae and EOM are normal. Pupils are equal, round, and reactive to light.  Neck: Normal range of motion. Neck supple. No thyromegaly present.  Cardiovascular: Normal rate, regular rhythm and normal heart sounds.   No murmur heard. Pulmonary/Chest: Effort normal and breath sounds normal. No respiratory distress. She has no wheezes. She has no rales.  Abdominal: Soft. Bowel sounds are normal. She exhibits no distension. There is tenderness.  Lymphadenopathy:    She has no cervical adenopathy.  Neurological: She is alert and oriented to person, place, and time. She has normal reflexes.  Skin: Skin is warm and dry.  Psychiatric: She has a normal mood and affect. Her behavior is normal.     Lab Results  Component Value Date   WBC 6.5 01/24/2016   HGB 11.1* 01/24/2016   HCT 35.8* 01/24/2016  PLT 265 01/24/2016   GLUCOSE 92 12/17/2015   CHOL 210* 08/05/2015   TRIG 81 08/05/2015   HDL 48 08/05/2015   LDLCALC 146* 08/05/2015   ALT 9* 12/16/2015   AST 9* 12/16/2015   NA  143 12/17/2015   K 4.3 12/17/2015   CL 109 12/17/2015   CREATININE 1.06* 12/17/2015   BUN 13 12/17/2015   CO2 25 12/17/2015   TSH 9.200* 12/14/2015   INR 1.14 12/16/2015   HGBA1C 4.7 11/13/2013    No results found.  Assessment & Plan:   Brandye was seen today for back pain.  Diagnoses and all orders for this visit:  Right-sided low back pain without sciatica -     Ambulatory referral to Physical Therapy  Essential hypertension  Gastroesophageal reflux disease without esophagitis  Other orders -     RABEprazole (ACIPHEX) 20 MG tablet; Take 1 tablet (20 mg total) by mouth 2 (two) times daily.     I have discontinued Ms. Price's omeprazole. I am also having her start on RABEprazole. Additionally, I am having her maintain her allopurinol, acetaminophen, DULoxetine, folic acid, thiamine, valsartan, cyclobenzaprine, ferrous sulfate, levothyroxine, sucralfate, and diclofenac.  Meds ordered this encounter  Medications  . RABEprazole (ACIPHEX) 20 MG tablet    Sig: Take 1 tablet (20 mg total) by mouth 2 (two) times daily.    Dispense:  60 tablet    Refill:  5     Follow-up: Return in about 1 month (around 02/23/2016) for Pain.  Claretta Fraise, M.D.

## 2016-01-27 ENCOUNTER — Ambulatory Visit (INDEPENDENT_AMBULATORY_CARE_PROVIDER_SITE_OTHER): Payer: Medicare Other | Admitting: Gastroenterology

## 2016-01-27 ENCOUNTER — Encounter: Payer: Self-pay | Admitting: Gastroenterology

## 2016-01-27 VITALS — BP 117/73 | HR 102 | Temp 97.2°F | Ht 68.0 in | Wt 157.0 lb

## 2016-01-27 DIAGNOSIS — R1033 Periumbilical pain: Secondary | ICD-10-CM | POA: Diagnosis not present

## 2016-01-27 DIAGNOSIS — R1900 Intra-abdominal and pelvic swelling, mass and lump, unspecified site: Secondary | ICD-10-CM | POA: Diagnosis not present

## 2016-01-27 DIAGNOSIS — K633 Ulcer of intestine: Secondary | ICD-10-CM

## 2016-01-27 LAB — CREATININE, SERUM: Creat: 1.59 mg/dL — ABNORMAL HIGH (ref 0.50–0.99)

## 2016-01-27 NOTE — Patient Instructions (Signed)
Please have your labs and CT done.  I will discuss ongoing Diclofenac use with Dr. Gala Romney. You need to limit NSAIDs.

## 2016-01-27 NOTE — Progress Notes (Signed)
cc'ed to pcp °

## 2016-01-27 NOTE — Assessment & Plan Note (Signed)
70 year old female with recent hospitalization for profound IDA. Colonoscopy with evidence of marked ulceration/abnormality of the ascending colon/ileocecal valve. Findings felt to be related to NSAIDs. Her hemoglobin has significantly improved. She was on indomethacin, aspirin, Voltaren prior to her procedure. She states she stopped all medications but has resumed voltaren for debilitating pain. On exam she has significant fullness in the pelvic region below the umbilicus. ?mass-like. Recommend CT the abdomen and pelvis for further evaluation. Would encourage no NSAIDs, manage pain with other modality. She will need to have further imaging of her small bowel, potentially with capsule endoscopy preceded by AGILE study but await CT findings first.

## 2016-01-27 NOTE — Progress Notes (Signed)
Primary Care Physician:  Claretta Fraise, MD  Primary Gastroenterologist:  Garfield Cornea, MD   Chief Complaint  Patient presents with  . Follow-up    HPI:  [name] is a 70 y.o. female here for follow-up of recent hospitalization. She presented with severe anemia, hemoglobin of 5.9. This was in the setting of indomethacin, aspirin, diclofenac. Labs consistent with IDA. She required blood transfusion. History of previous alcohol abuse. She underwent EGD and colonoscopy. She had an antral pedunculated polyp measuring 1 cm with superficial ulceration. Polyp was removed, biopsy benign. She had marked circumferential ulceration in the proximal ascending colon/ileocecal valve. Marked deformity of the ileocecal valve. Could not be intubated. Biopsies most consistent with NSAID related injury and inflammatory bowel disease, former being favored.  When patient was discharged, she was advised no NSAIDs. She was started on PPI and iron supplement. She states she is on Voltaren. Has to have something to function. Continues to have abdominal pain [id number] umbilicus although subjectively she feels it is better. Bowel movement every couple of days. No melena or rectal bleeding. Appetite is good. States she is volunteering now. She is very pleased because she was in rehabilitation last fall for strokes but is now doing well. She would like to be checked for hepatitis C, she is a "Copywriter, advertising". Denies risk factors such as tattoos, intranasal or intravascular drugs. First blood transfusion was last month. 6She notes that she takes Voltare twice daily for her back.   Current Outpatient Prescriptions  Medication Sig Dispense Refill  . acetaminophen (TYLENOL) 500 MG tablet Take 1 tablet (500 mg total) by mouth every 6 (six) hours as needed for moderate pain. Pain 90 tablet 4  . cyclobenzaprine (FLEXERIL) 10 MG tablet Take 1 tablet (10 mg total) by mouth 3 (three) times daily as needed for muscle spasms. 90 tablet 1  .  diclofenac (VOLTAREN) 75 MG EC tablet TAKE ONE TABLET BY MOUTH TWICE DAILY 60 tablet 0  . DULoxetine (CYMBALTA) 60 MG capsule Take 1 capsule (60 mg total) by mouth daily. 90 capsule 3  . folic acid (FOLVITE) 1 MG tablet Take 1 tablet (1 mg total) by mouth daily. 90 tablet 3  . levothyroxine (SYNTHROID, LEVOTHROID) 125 MCG tablet Take 1 tablet (125 mcg total) by mouth daily. 90 tablet 1  . omeprazole (PRILOSEC) 40 MG capsule Take 40 mg by mouth daily.    Marland Kitchen thiamine (VITAMIN B-1) 100 MG tablet Take 1 tablet (100 mg total) by mouth daily. 90 tablet 3  . valsartan (DIOVAN) 160 MG tablet Take 1 tablet (160 mg total) by mouth daily. 1 tablet daily x 1 wk Then 2 tablets daily (Patient taking differently: Take 320 mg by mouth daily. ) 60 tablet 5   No current facility-administered medications for this visit.    Allergies as of 01/27/2016 - Review Complete 01/27/2016  Allergen Reaction Noted  . Norvasc [amlodipine besylate] Hives 04/21/2011  . Procardia [nifedipine] Swelling 04/21/2011    Past Medical History  Diagnosis Date  . Hypertension   . Menopause   . Hypothyroid   . Mini stroke ([place])   . Hyperlipidemia   . B12 deficiency   . Shortness of breath   . Headache(784.0)   . Anxiety   . Pneumonia   . Anemia   . Depression   . Stroke [facility]) 2010    no deficits  . Alcoholism ([place]) 01/09/2012  . Hyponatremia 01/09/2012  . Chronic kidney disease (CKD), stage II (mild)   . COPD (  chronic obstructive pulmonary disease) ([place])   . Orthostatic syncope 10/02/2012  . Carotid artery narrowing 10/03/2012    On the right.    Past Surgical History  Procedure Laterality Date  . Appendectomy    . Tonsillectomy    . Cataract extraction w/phaco  06/20/2012    Procedure: CATARACT EXTRACTION PHACO AND INTRAOCULAR LENS PLACEMENT (IOC);  Surgeon: Tonny Branch, MD;  Location: [facility];  Service: Ophthalmology;  Laterality: Right;  CDE:10.66  . Cataract extraction w/phaco  07/11/2012    Procedure: CATARACT  EXTRACTION PHACO AND INTRAOCULAR LENS PLACEMENT (IOC);  Surgeon: Tonny Branch, MD;  Location: [facility];  Service: Ophthalmology;  Laterality: Left;  CDE: 12.69  . Colonoscopy N/A 12/17/2015    RMR: Marked circumferential ulceration of the ascending colon/Ileocecal valve ulceration with biopsy most consistent with NSAID injury versus inflammatory bowel disease, pancolonoc diverticulosis redundant colon  . Esophagogastroduodenoscopy N/A 12/17/2015    [id number] gastric polyp, biopsy benign    Family History  Problem Relation Age of Onset  . GER disease Mother   . Hypertension Maternal Grandmother     Social History   Social History  . Marital Status: Widowed    Spouse Name: N/A  . Number of Children: N/A  . Years of Education: N/A   Occupational History  . Not on file.   Social History Main Topics  . Smoking status: Former Smoker -- 2.00 packs/day for 30 years    Types: Cigarettes    Quit date: 06/17/1992  . Smokeless tobacco: Not on file  . Alcohol Use: 7.0 oz/week    14 drink(s) per week  . Drug Use: No  . Sexual Activity: Not on file   Other Topics Concern  . Not on file   Social History Narrative      ROS:  General: Negative for anorexia, weight loss, fever, chills, fatigue, weakness. Eyes: Negative for vision changes.  ENT: Negative for hoarseness, difficulty swallowing , nasal congestion. CV: Negative for chest pain, angina, palpitations, dyspnea on exertion, peripheral edema.  Respiratory: Negative for dyspnea at rest, dyspnea on exertion, cough, sputum, wheezing.  GI: See history of present illness. GU:  Negative for dysuria, hematuria, urinary incontinence, urinary frequency, nocturnal urination.  [id number] back pain  Derm: Negative for rash or itching.  Neuro: Negative for weakness, abnormal sensation, seizure, frequent headaches, memory loss, confusion.  Psych: Negative for anxiety, depression, suicidal ideation, hallucinations.  Endo: Negative for unusual  weight change.  Heme: Negative for bruising or bleeding. Allergy: Negative for rash or hives.    Physical Examination:  BP 117/73 mmHg  Pulse 102  Temp(Src) 97.2 F (36.2 C)  Ht 5\' 8"  (1.727 m)  Wt 157 lb (71.215 kg)  BMI 23.88 kg/m2  LMP 08/21/2013   General: Well-nourished, well-developed in no acute distress.  Head: Normocephalic, atraumatic.   Eyes: Conjunctiva pink, no icterus. Mouth: Oropharyngeal mucosa moist and pink , no lesions erythema or exudate. Neck: Supple without thyromegaly, masses, or lymphadenopathy.  Lungs: Clear to auscultation bilaterally.  Heart: Regular rate and rhythm, no murmurs rubs or gallops.  Abdomen: Bowel sounds are normal, fullness in the pelvis with tenderness below umbilicus and rlq, nondistended, no hepatosplenomegaly or masses, no abdominal bruits or    hernia , no rebound or guarding.   Rectal: not performed Extremities: No lower extremity edema. No clubbing or deformities.  Neuro: Alert and oriented x 4 , grossly normal neurologically.  Skin: Warm and dry, no rash or jaundice.   Psych: Alert and cooperative,  normal mood and affect.  Labs: Lab Results  Component Value Date   WBC 6.5 01/24/2016   HGB 11.1* 01/24/2016   HCT 35.8* 01/24/2016   MCV 82.5 01/24/2016   PLT 265 01/24/2016     Imaging Studies: No results found.

## 2016-01-28 LAB — HEPATITIS C ANTIBODY: HCV Ab: NEGATIVE

## 2016-02-01 NOTE — Progress Notes (Signed)
Quick Note:  Please let CT know her Cre 1.59 for upcoming contrast CT A/P. Patient needs to follow up with PCP regarding elevated creatinine. Her HCV Ab is negative. ______

## 2016-02-02 ENCOUNTER — Telehealth: Payer: Self-pay | Admitting: Family Medicine

## 2016-02-02 NOTE — Telephone Encounter (Signed)
Pt advised Dr.Rourke had sent in Carafate and pt voiced understanding, also pt requested follow up appt with Dr.Stacks this Monday, appt given for 2/27 at 7:55.

## 2016-02-04 ENCOUNTER — Ambulatory Visit (HOSPITAL_COMMUNITY)
Admission: RE | Admit: 2016-02-04 | Discharge: 2016-02-04 | Disposition: A | Discharge status: Home / Self Care | Payer: Medicare Other | Source: Ambulatory Visit | Attending: Gastroenterology | Admitting: Gastroenterology

## 2016-02-04 DIAGNOSIS — K633 Ulcer of intestine: Secondary | ICD-10-CM | POA: Diagnosis not present

## 2016-02-04 DIAGNOSIS — I700 Atherosclerosis of aorta: Secondary | ICD-10-CM | POA: Diagnosis not present

## 2016-02-04 DIAGNOSIS — R1033 Periumbilical pain: Secondary | ICD-10-CM | POA: Diagnosis not present

## 2016-02-04 DIAGNOSIS — R1900 Intra-abdominal and pelvic swelling, mass and lump, unspecified site: Secondary | ICD-10-CM | POA: Diagnosis not present

## 2016-02-04 DIAGNOSIS — R102 Pelvic and perineal pain: Secondary | ICD-10-CM | POA: Diagnosis not present

## 2016-02-04 MED ORDER — IOHEXOL 300 MG/ML  SOLN
80.0000 mL | Freq: Once | INTRAMUSCULAR | Status: AC | PRN
  Administered 2016-02-04: 100 mL via INTRAVENOUS

## 2016-02-04 MED ORDER — DIATRIZOATE MEGLUMINE & SODIUM 66-10 % PO SOLN
ORAL | Status: AC
  Administered 2016-02-04: 30 mL
  Filled 2016-02-04: qty 30

## 2016-02-07 ENCOUNTER — Encounter: Payer: Self-pay | Admitting: Family Medicine

## 2016-02-07 ENCOUNTER — Ambulatory Visit (INDEPENDENT_AMBULATORY_CARE_PROVIDER_SITE_OTHER): Payer: Medicare Other | Admitting: Family Medicine

## 2016-02-07 VITALS — BP 111/72 | HR 88 | Temp 97.5°F | Ht 68.0 in | Wt 159.0 lb

## 2016-02-07 DIAGNOSIS — K5901 Slow transit constipation: Secondary | ICD-10-CM

## 2016-02-07 DIAGNOSIS — D500 Iron deficiency anemia secondary to blood loss (chronic): Secondary | ICD-10-CM | POA: Diagnosis not present

## 2016-02-07 DIAGNOSIS — M545 Low back pain, unspecified: Secondary | ICD-10-CM

## 2016-02-07 DIAGNOSIS — D509 Iron deficiency anemia, unspecified: Secondary | ICD-10-CM

## 2016-02-07 DIAGNOSIS — K633 Ulcer of intestine: Secondary | ICD-10-CM

## 2016-02-07 NOTE — Progress Notes (Signed)
Subjective:  Patient ID: [name], female    DOB: [birth year]  Age: 70 y.o. MRN: [id number]  CC: Back Pain and Anemia   HPI VERLENA Gross presents for back the same. Had CT 3 days ago. Sent here for urgent follow up by GI she states. She isn't sure what they saw. CT reviewed. Increased stool noted along with aortic atherosclerosis. No aneurysm noted. Stool obscurred some of the colon from view.  Denies hematochezia. She delayed PT for her back until after the CT. Now having midline low back pain 4-5/10. Intermittent. No radiation.   History [name] has a past medical history of Hypertension; Menopause; Hypothyroid; Mini stroke ([place]); Hyperlipidemia; B12 deficiency; Shortness of breath; Headache(784.0); Anxiety; Pneumonia; Anemia; Depression; Stroke [facility]) (2010); Alcoholism ([place]) (01/09/2012); Hyponatremia (01/09/2012); Chronic kidney disease (CKD), stage II (mild); COPD (chronic obstructive pulmonary disease) ([place]); Orthostatic syncope (10/02/2012); and Carotid artery narrowing (10/03/2012).   She has past surgical history that includes Appendectomy; Tonsillectomy; Cataract extraction w/PHACO (06/20/2012); Cataract extraction w/PHACO (07/11/2012); Colonoscopy (N/A, 12/17/2015); and Esophagogastroduodenoscopy (N/A, 12/17/2015).   Her family history includes GER disease in her mother; Hypertension in her maternal grandmother.She reports that she quit smoking about 23 years ago. Her smoking use included Cigarettes. She has a 60 pack-year smoking history. She does not have any smokeless tobacco history on file. She reports that she drinks about 7.0 oz of alcohol per week. She reports that she does not use illicit drugs.    ROS Review of Systems  Constitutional: Negative for fever, activity change and appetite change.  HENT: Negative for congestion, rhinorrhea and sore throat.   Eyes: Negative for visual disturbance.  Respiratory: Negative for cough and shortness of breath.   Cardiovascular:  Negative for chest pain and palpitations.  Gastrointestinal: Positive for abdominal pain. Negative for nausea and diarrhea.  Genitourinary: Negative for dysuria.  Musculoskeletal: Positive for back pain and arthralgias. Negative for myalgias.    Objective:  BP 111/72 mmHg  Pulse 88  Temp(Src) 97.5 F (36.4 C) (Oral)  Ht _0  (1.727 m)  Wt 159 lb (72.122 kg)  BMI 24.18 kg/m2  SpO2 100%  LMP 08/21/2013  BP Readings from Last 3 Encounters:  02/07/16 111/72  01/27/16 117/73  01/26/16 123/72    Wt Readings from Last 3 Encounters:  02/07/16 159 lb (72.122 kg)  01/27/16 157 lb (71.215 kg)  01/26/16 156 lb (70.761 kg)     Physical Exam  Constitutional: She is oriented to person, place, and time. She appears well-developed and well-nourished. No distress.  HENT:  Head: Normocephalic and atraumatic.  Right Ear: External ear normal.  Left Ear: External ear normal.  Nose: Nose normal.  Mouth/Throat: Oropharynx is clear and moist.  Eyes: Conjunctivae and EOM are normal. Pupils are equal, round, and reactive to light.  Neck: Normal range of motion. Neck supple. No thyromegaly present.  Cardiovascular: Normal rate, regular rhythm and normal heart sounds.   No murmur heard. Pulmonary/Chest: Effort normal and breath sounds normal. No respiratory distress. She has no wheezes. She has no rales.  Abdominal: Soft. Bowel sounds are normal. She exhibits no distension. There is no tenderness.  Musculoskeletal: She exhibits tenderness (lumbar lower midline). She exhibits no edema.  Lymphadenopathy:    She has no cervical adenopathy.  Neurological: She is alert and oriented to person, place, and time. She has normal reflexes.  Skin: Skin is warm and dry.  Psychiatric: She has a normal mood and affect. Her behavior is normal. Judgment and  thought content normal.     Lab Results  Component Value Date   WBC 6.5 01/24/2016   HGB 11.1* 01/24/2016   HCT 35.8* 01/24/2016   PLT 265  01/24/2016   GLUCOSE 92 12/17/2015   CHOL 210* 08/05/2015   TRIG 81 08/05/2015   HDL 48 08/05/2015   LDLCALC 146* 08/05/2015   ALT 9* 12/16/2015   AST 9* 12/16/2015   NA 143 12/17/2015   K 4.3 12/17/2015   CL 109 12/17/2015   CREATININE 1.59* 01/27/2016   BUN 13 12/17/2015   CO2 25 12/17/2015   TSH 9.200* 12/14/2015   INR 1.14 12/16/2015   HGBA1C 4.7 11/13/2013    Ct Abdomen Pelvis W Contrast  02/04/2016  CLINICAL DATA:  Pelvic pressure and pain. Rectal bleeding. Polyp on colonoscopy. EXAM: CT ABDOMEN AND PELVIS WITH CONTRAST TECHNIQUE: Multidetector CT imaging of the abdomen and pelvis was performed using the standard protocol following bolus administration of intravenous contrast. CONTRAST:  133m OMNIPAQUE IOHEXOL 300 MG/ML  SOLN COMPARISON:  None. FINDINGS: Lung bases are clear. No pleural or pericardial fluid. There is a 1 cm hemangioma within the posterior segment of the right lobe of the liver, image 13. The remainder of the liver parenchyma is normal. No calcified gallstones. The spleen is normal. The pancreas is normal. Duodenum diverticulum is present. The adrenal glands are normal. The kidneys are normal. No cyst, mass, stone or hydronephrosis. There is atherosclerosis of the aorta and its branch vessels. The IVC is normal. No retroperitoneal mass or lymphadenopathy. There is a large amount of fecal matter throughout the colon that could go along with constipation. I do not identify a definite colon mass lesion by CT. Uterus and adnexal regions appear unremarkable. There chronic degenerative changes of the spine. Old partial compression fractures at L1, L2, L3 and L5. IMPRESSION: Large amount of fecal matter throughout the colon that could go along with constipation. No other focal or active bowel pathology seen by CT. Described the colon polyp not identify, but CT in the unprepped state is not accurate for that diagnosis. Atherosclerosis of the aorta and its branch vessels without  aneurysm. Electronically Signed   By: MNelson ChimesM.D.   On: 02/04/2016 16:14    Assessment & Plan:   PChakirawas seen today for back pain and anemia.  Diagnoses and all orders for this visit:  Colonic ulcer -     CBC with Differential/Platelet  Iron deficiency anemia  Iron deficiency anemia due to chronic blood loss -     CBC with Differential/Platelet -     CMP14+EGFR  Right-sided low back pain without sciatica -     CMP14+EGFR  Slow transit constipation -     CMP14+EGFR      I have discontinued Ms. Price's omeprazole. I am also having her maintain her acetaminophen, DULoxetine, folic acid, thiamine, valsartan, cyclobenzaprine, levothyroxine, and diclofenac.  No orders of the defined types were placed in this encounter.     Follow-up: Return in about 1 month (around 03/06/2016) for back pain.  WClaretta Fraise M.D.

## 2016-02-08 ENCOUNTER — Other Ambulatory Visit: Payer: Self-pay | Admitting: *Deleted

## 2016-02-08 DIAGNOSIS — N289 Disorder of kidney and ureter, unspecified: Secondary | ICD-10-CM

## 2016-02-08 LAB — CMP14+EGFR
ALT: 7 IU/L (ref 0–32)
AST: 8 IU/L (ref 0–40)
Albumin/Globulin Ratio: 1.4 (ref 1.1–2.5)
Albumin: 4 g/dL (ref 3.6–4.8)
Alkaline Phosphatase: 112 IU/L (ref 39–117)
BUN/Creatinine Ratio: 19 (ref 11–26)
BUN: 32 mg/dL — ABNORMAL HIGH (ref 8–27)
Bilirubin Total: <0.2 mg/dL (ref 0.0–1.2)
CO2: 20 mmol/L (ref 18–29)
Calcium: 9.4 mg/dL (ref 8.7–10.3)
Chloride: 101 mmol/L (ref 96–106)
Creatinine, Ser: 1.65 mg/dL — ABNORMAL HIGH (ref 0.57–1.00)
GFR calc Af Amer: 37 mL/min/{1.73_m2} — ABNORMAL LOW (ref >59)
GFR calc non Af Amer: 32 mL/min/{1.73_m2} — ABNORMAL LOW (ref >59)
Globulin, Total: 2.9 g/dL (ref 1.5–4.5)
Glucose: 82 mg/dL (ref 65–99)
Potassium: 5.6 mmol/L — ABNORMAL HIGH (ref 3.5–5.2)
Sodium: 139 mmol/L (ref 134–144)
Total Protein: 6.9 g/dL (ref 6.0–8.5)

## 2016-02-08 LAB — CBC WITH DIFFERENTIAL/PLATELET
Basophils Absolute: 0 10*3/uL (ref 0.0–0.2)
Basos: 1 %
EOS (ABSOLUTE): 0.4 10*3/uL (ref 0.0–0.4)
Eos: 7 %
Hematocrit: 34.3 % (ref 34.0–46.6)
Hemoglobin: 11.2 g/dL (ref 11.1–15.9)
Immature Grans (Abs): 0 10*3/uL (ref 0.0–0.1)
Immature Granulocytes: 1 %
Lymphocytes Absolute: 1.6 10*3/uL (ref 0.7–3.1)
Lymphs: 25 %
MCH: 27 pg (ref 26.6–33.0)
MCHC: 32.7 g/dL (ref 31.5–35.7)
MCV: 83 fL (ref 79–97)
Monocytes Absolute: 0.5 10*3/uL (ref 0.1–0.9)
Monocytes: 8 %
Neutrophils Absolute: 3.7 10*3/uL (ref 1.4–7.0)
Neutrophils: 58 %
Platelets: 285 10*3/uL (ref 150–379)
RBC: 4.15 x10E6/uL (ref 3.77–5.28)
RDW: 22.6 % — ABNORMAL HIGH (ref 12.3–15.4)
WBC: 6.2 10*3/uL (ref 3.4–10.8)

## 2016-02-11 NOTE — Progress Notes (Signed)
Quick Note:  Please let patient know her CT showed a large amount of stool throughout the entire colon. She also had old partial compression fractures at L1, L2, L3, L5.  If she having problems having a bowel movement? Does she have a history of lower back pain and did she know about prior compression fractures? Proceed with Agile capsule (dummy capsule). Let me know when it is scheduled. ______

## 2016-02-14 ENCOUNTER — Encounter: Payer: Self-pay | Admitting: Physical Therapy

## 2016-02-14 ENCOUNTER — Telehealth: Payer: Self-pay | Admitting: Family Medicine

## 2016-02-14 ENCOUNTER — Ambulatory Visit: Payer: Medicare Other | Attending: Family Medicine | Admitting: Physical Therapy

## 2016-02-14 DIAGNOSIS — M545 Low back pain, unspecified: Secondary | ICD-10-CM

## 2016-02-14 DIAGNOSIS — R2681 Unsteadiness on feet: Secondary | ICD-10-CM | POA: Diagnosis not present

## 2016-02-14 DIAGNOSIS — F1020 Alcohol dependence, uncomplicated: Secondary | ICD-10-CM

## 2016-02-14 DIAGNOSIS — R6889 Other general symptoms and signs: Secondary | ICD-10-CM | POA: Diagnosis not present

## 2016-02-14 DIAGNOSIS — I10 Essential (primary) hypertension: Secondary | ICD-10-CM

## 2016-02-14 MED ORDER — VALSARTAN 160 MG PO TABS: 320.0000 mg | ORAL_TABLET | Freq: Every day | ORAL | Status: DC

## 2016-02-14 MED ORDER — FOLIC ACID 1 MG PO TABS: 1.0000 mg | ORAL_TABLET | Freq: Every day | ORAL | Status: DC

## 2016-02-14 NOTE — Telephone Encounter (Signed)
done

## 2016-02-14 NOTE — Patient Instructions (Signed)
Quadriceps (Prone)   On stomach with sheet around ankles, knees together, hips down, pull heels toward bottom. Keep hips flat. Hold __30_-60_ seconds. Repeat _3__ times. Do __3__ sessions per day. Or do on your side. CAUTION: Stretch should be gentle, steady and slow.  Copyright  VHI. All rights reserved.         Achilles Tendon Stretch   Stand with hands supported on wall, elbows slightly bent, feet parallel and both heels on floor, front knee bent, back knee straight. Slowly relax back knee until a stretch is felt in achilles tendon. Hold __30-60_ seconds. Repeat with leg positions switched. Repeat with back knee slightly bent.  Copyright  VHI. All rights reserved.   Leg Extension (Hamstring)   Sit toward front edge of chair, with leg out straight, heel on floor, toes pointing toward body. Keeping back straight, bend forward at hip. Holding 60 seconds. Repeat _3__ times. Repeat with other leg. Do _3__ sessions per day.   Madelyn Flavors, PT 02/14/2016 9:04 AM [facility] [address] [place], [place], [address] Phone: [phone]   Fax:  [phone]

## 2016-02-14 NOTE — Therapy (Signed)
[facility] [place], [place], [address] Phone: [phone]   Fax:  [phone]  Physical Therapy Evaluation  Patient Details  Name: [name] MRN: [id number] Date of Birth: 1946-04-23 Referring Provider: Claretta Fraise MD  Encounter Date: 02/14/2016      PT End of Session - 02/14/16 0904    Visit Number 1   Number of Visits 16   Date for PT Re-Evaluation 04/10/16   PT Start Time 0836   PT Stop Time 0904   PT Time Calculation (min) 28 min   Activity Tolerance Patient tolerated treatment well   Behavior During Therapy [facility] for tasks assessed/performed      Past Medical History  Diagnosis Date  . Hypertension   . Menopause   . Hypothyroid   . Mini stroke ([place])   . Hyperlipidemia   . B12 deficiency   . Shortness of breath   . Headache(784.0)   . Anxiety   . Pneumonia   . Anemia   . Depression   . Stroke [facility]) 2010    no deficits  . Alcoholism ([place]) 01/09/2012  . Hyponatremia 01/09/2012  . Chronic kidney disease (CKD), stage II (mild)   . COPD (chronic obstructive pulmonary disease) ([place])   . Orthostatic syncope 10/02/2012  . Carotid artery narrowing 10/03/2012    On the right.    Past Surgical History  Procedure Laterality Date  . Appendectomy    . Tonsillectomy    . Cataract extraction w/phaco  06/20/2012    Procedure: CATARACT EXTRACTION PHACO AND INTRAOCULAR LENS PLACEMENT (IOC);  Surgeon: Tonny Branch, MD;  Location: [facility];  Service: Ophthalmology;  Laterality: Right;  CDE:10.66  . Cataract extraction w/phaco  07/11/2012    Procedure: CATARACT EXTRACTION PHACO AND INTRAOCULAR LENS PLACEMENT (IOC);  Surgeon: Tonny Branch, MD;  Location: [facility];  Service: Ophthalmology;  Laterality: Left;  CDE: 12.69  . Colonoscopy N/A 12/17/2015    RMR: Marked circumferential ulceration of the ascending colon/Ileocecal valve ulceration with biopsy most consistent with NSAID injury versus inflammatory bowel disease,  pancolonoc diverticulosis redundant colon  . Esophagogastroduodenoscopy N/A 12/17/2015    [id number] gastric polyp, biopsy benign    There were no vitals filed for this visit.  Visit Diagnosis:  Midline low back pain without sciatica - Plan: PT plan of care cert/re-cert  Activity intolerance - Plan: PT plan of care cert/re-cert  Unsteadiness - Plan: PT plan of care cert/re-cert      Subjective Assessment - 02/14/16 0837    Subjective Patient states her back started hurting during several months ago during therapy following a couple of strokes. Patient has pain with standing and with ADLS such as vacuuming. Patient has experienced numerous falls in the past six months. Last wekk she fell and it was due to one foot catching on the other. She lives with son and they have cameras to keep eye on kids, so they could see the fall.   Pertinent History HTN, COPD, left knee pain   Limitations Standing   How long can you stand comfortably? 30 min   Diagnostic tests xray shows anterior compression L1-3 and Gd 1 anterolisthesis of L3 on L4   Patient Stated Goals to be able to do what I used to do like walk 2 miles, vacuum clean and cook.   Currently in Pain? Yes   Pain Score 8   when standing   Pain Location Back   Pain Orientation Right;Lower   Pain Descriptors / Indicators  Throbbing   Pain Type Acute pain   Pain Onset More than a month ago   Pain Frequency Intermittent   Aggravating Factors  standing and chores   Pain Relieving Factors sitting and lying down   Effect of Pain on Daily Activities limited            [facility] PT Assessment - 02/14/16 0001    Assessment   Medical Diagnosis LBP without sciatica   Referring Provider Claretta Fraise MD   Onset Date/Surgical Date 12/12/15   Next MD Visit end of March   Precautions   Precautions Fall   Precaution Comments Bee sting allergy   Balance Screen   Has the patient fallen in the past 6 months Yes   How many times? 4   Has the  patient had a decrease in activity level because of a fear of falling?  Yes   Is the patient reluctant to leave their home because of a fear of falling?  No   Home [employer] residence   Additional Comments has railing into home; no problem   Prior Function   Level of Independence Independent  Son and family live with patient   Observation/Other Assessments   Focus on Therapeutic Outcomes (FOTO)  58% limited   Posture/Postural Control   Posture Comments forward head, rounded shoulders, flattened lumbar spine   ROM / Strength   AROM / PROM / Strength AROM;Strength   AROM   AROM Assessment Site Lumbar   Lumbar Flexion 50%  due to HS tightness   Lumbar Extension WNL   Lumbar - Right Side Bend full   Lumbar - Left Side Bend full   Lumbar - Right Rotation full   Lumbar - Left Rotation full   Strength   Overall Strength Comments B HS 4+/5, else 5/5 with MMT   Flexibility   Soft Tissue Assessment /Muscle Length --  marked HS B, quads/HF and gastrocs                           PT Education - 02/14/16 1533    Education provided Yes   Education Details hep   Person(s) Educated Patient   Methods Demonstration;Explanation;Handout   Comprehension Verbalized understanding;Returned demonstration;Verbal cues required             PT Long Term Goals - 02/14/16 1538    PT LONG TERM GOAL #1   Title I with HEP   Time 6   Period Weeks   Status New   PT LONG TERM GOAL #2   Title decreased pain with ADLS to [id number] or less.   Time 6   Period Weeks   Status New   PT LONG TERM GOAL #3   Title no falls reported in previous 4 weeks.   Time 6   Period Weeks   Status New               Plan - 02/14/16 1533    Clinical Impression Statement Patient presents with c/o LBP affecting ADLS including prolonged standing and walking and vacuuming. She has had two strokes (last stroke 10/22/14) and first developed LBP in therapy. She also has  had multiple falls in the past six months.    Pt will benefit from skilled therapeutic intervention in order to improve on the following deficits Decreased range of motion;Pain;Impaired flexibility;Decreased activity tolerance;Postural dysfunction   Rehab Potential Good   PT Frequency 2x /  week   PT Duration 6 weeks   PT Treatment/Interventions Cryotherapy;Electrical Stimulation;Moist Heat;Therapeutic exercise;Balance training;Ultrasound;Neuromuscular re-education;Patient/family education;Manual techniques;Passive range of motion;Dry needling   PT Next Visit Plan Do BERG balance assessment; TE based on balanced assessment; also core, flexibiltiy. Modalities prn. Avoid lumbar extension.   PT Home Exercise Plan HS, quad and gastroc/soleus stretch   Consulted and Agree with Plan of Care Patient          G-Codes - Feb 25, 2016 1540    Functional Assessment Tool Used FOTO 58% LIMITED   Functional Limitation Mobility: Walking and moving around   Mobility: Walking and Moving Around Current Status [phone]) At least 40 percent but less than 60 percent impaired, limited or restricted   Mobility: Walking and Moving Around Goal Status [phone]) At least 40 percent but less than 60 percent impaired, limited or restricted       Problem List Patient Active Problem List   Diagnosis Date Noted  . Pelvic mass 01/27/2016  . Periumbilical abdominal pain 01/27/2016  . Colonic ulcer   . Diverticulosis of colon without hemorrhage   . Iron deficiency anemia due to chronic blood loss   . Lumbar pain 12/14/2015  . Chronic kidney disease (CKD), stage II (mild) 10/03/2012  . Carotid artery narrowing 10/03/2012  . Hypothyroidism 10/02/2012  . HTN (hypertension) 01/09/2012  . Hyperlipidemia 01/09/2012  . Anemia 01/09/2012  . B12 deficiency 01/09/2012    Madelyn Flavors PT  25-Feb-2016, 3:43 PM  [facility] [place], [place], [address] Phone: [phone]    Fax:  [phone]  Name: KODEE BLACKWOOD MRN: [id number] Date of Birth: 04-Aug-1946

## 2016-02-15 ENCOUNTER — Other Ambulatory Visit: Payer: Self-pay

## 2016-02-15 DIAGNOSIS — K625 Hemorrhage of anus and rectum: Secondary | ICD-10-CM

## 2016-02-16 ENCOUNTER — Encounter: Payer: Self-pay | Admitting: Physical Therapy

## 2016-02-16 ENCOUNTER — Ambulatory Visit: Payer: Medicare Other | Admitting: Physical Therapy

## 2016-02-16 DIAGNOSIS — M545 Low back pain, unspecified: Secondary | ICD-10-CM

## 2016-02-16 DIAGNOSIS — R6889 Other general symptoms and signs: Secondary | ICD-10-CM

## 2016-02-16 DIAGNOSIS — R2681 Unsteadiness on feet: Secondary | ICD-10-CM | POA: Diagnosis not present

## 2016-02-16 NOTE — Therapy (Signed)
[facility] [place], [place], [address] Phone: [phone]   Fax:  [phone]  Physical Therapy Treatment  Patient Details  Name: [name] MRN: [id number] Date of Birth: 1946/04/29 Referring Provider: Claretta Fraise MD  Encounter Date: 02/16/2016      PT End of Session - 02/16/16 0904    Visit Number 2   Number of Visits 16   Date for PT Re-Evaluation 04/10/16   PT Start Time 0903   PT Stop Time 0945   PT Time Calculation (min) 42 min   Activity Tolerance Patient tolerated treatment well   Behavior During Therapy [facility] for tasks assessed/performed      Past Medical History  Diagnosis Date  . Hypertension   . Menopause   . Hypothyroid   . Mini stroke ([place])   . Hyperlipidemia   . B12 deficiency   . Shortness of breath   . Headache(784.0)   . Anxiety   . Pneumonia   . Anemia   . Depression   . Stroke [facility]) 2010    no deficits  . Alcoholism ([place]) 01/09/2012  . Hyponatremia 01/09/2012  . Chronic kidney disease (CKD), stage II (mild)   . COPD (chronic obstructive pulmonary disease) ([place])   . Orthostatic syncope 10/02/2012  . Carotid artery narrowing 10/03/2012    On the right.    Past Surgical History  Procedure Laterality Date  . Appendectomy    . Tonsillectomy    . Cataract extraction w/phaco  06/20/2012    Procedure: CATARACT EXTRACTION PHACO AND INTRAOCULAR LENS PLACEMENT (IOC);  Surgeon: Tonny Branch, MD;  Location: [facility];  Service: Ophthalmology;  Laterality: Right;  CDE:10.66  . Cataract extraction w/phaco  07/11/2012    Procedure: CATARACT EXTRACTION PHACO AND INTRAOCULAR LENS PLACEMENT (IOC);  Surgeon: Tonny Branch, MD;  Location: [facility];  Service: Ophthalmology;  Laterality: Left;  CDE: 12.69  . Colonoscopy N/A 12/17/2015    RMR: Marked circumferential ulceration of the ascending colon/Ileocecal valve ulceration with biopsy most consistent with NSAID injury versus inflammatory bowel disease, pancolonoc  diverticulosis redundant colon  . Esophagogastroduodenoscopy N/A 12/17/2015    [id number] gastric polyp, biopsy benign    There were no vitals filed for this visit.  Visit Diagnosis:  Midline low back pain without sciatica  Activity intolerance  Unsteadiness      Subjective Assessment - 02/16/16 0904    Subjective Reports that her legs hurt today and is enough to make her cry. Patient states that next week she is supposed to go for testing for stomach. Also states that she has has L knee pain and has needed surgery for cartilage demage.   Pertinent History HTN, COPD, left knee pain   Limitations Standing   How long can you stand comfortably? 30 min   Diagnostic tests xray shows anterior compression L1-3 and Gd 1 anterolisthesis of L3 on L4   Patient Stated Goals to be able to do what I used to do like walk 2 miles, vacuum clean and cook.   Currently in Pain? Yes   Pain Score 9    Pain Location Leg   Pain Orientation Right;Left  L>R   Pain Type Acute pain   Pain Onset More than a month ago            [facility] PT Assessment - 02/16/16 0001    Assessment   Medical Diagnosis LBP without sciatica   Onset Date/Surgical Date 12/12/15   Next MD Visit end of March  Precautions   Precautions Fall   Precaution Comments Bee sting allergy                     [facility] Adult PT Treatment/Exercise - 02/16/16 0001    Standardized Balance Assessment   Standardized Balance Assessment Berg Balance Test   Berg Balance Test   Sit to Stand Able to stand without using hands and stabilize independently   Standing Unsupported Able to stand safely 2 minutes   Sitting with Back Unsupported but Feet Supported on Floor or Stool Able to sit safely and securely 2 minutes   Stand to Sit Sits safely with minimal use of hands   Transfers Able to transfer safely, minor use of hands   Standing Unsupported with Eyes Closed Able to stand 10 seconds safely   Standing Ubsupported with Feet  Together Able to place feet together independently and stand 1 minute safely   From Standing, Reach Forward with Outstretched Arm Can reach confidently >25 cm (10")   From Standing Position, Pick up Object from Floor Able to pick up shoe safely and easily   From Standing Position, Turn to Look Behind Over each Shoulder Looks behind one side only/other side shows less weight shift   Turn 360 Degrees Able to turn 360 degrees safely in 4 seconds or less   Standing Unsupported, Alternately Place Feet on Step/Stool Able to stand independently and safely and complete 8 steps in 20 seconds   Standing Unsupported, One Foot in [employer] balance while stepping or standing   Standing on One Leg Able to lift leg independently and hold equal to or more than 3 seconds   Total Score 49   Exercises   Exercises Knee/Hip   Knee/Hip Exercises: Aerobic   Nustep NuStep L4 x12 min   Knee/Hip Exercises: Standing   Heel Raises Both;2 sets;10 reps   Heel Raises Limitations B toe raises x20 reps   Knee/Hip Exercises: Seated   Long Arc Quad Strengthening;Both;2 sets;10 reps;Weights   Long Arc Quad Weight 2 lbs.   Sit to Sand 20 reps;without UE support   Knee/Hip Exercises: Supine   Bridges Strengthening;Both;2 sets;10 reps   Straight Leg Raises Strengthening;Both;2 sets;10 reps   Other Supine Knee/Hip Exercises Supine B marching 2# x20 reps each   Knee/Hip Exercises: Sidelying   Hip ABduction Strengthening;Both;2 sets;10 reps                     PT Long Term Goals - 02/16/16 1016    PT LONG TERM GOAL #1   Title I with HEP   Time 6   Period Weeks   Status Achieved   PT LONG TERM GOAL #2   Title decreased pain with ADLS to [id number] or less.   Time 6   Period Weeks   Status On-going   PT LONG TERM GOAL #3   Title no falls reported in previous 4 weeks.   Time 6   Period Weeks   Status On-going               Plan - 02/16/16 0949    Clinical Impression Statement Patient tolerated  today's treatment fairly well although she reported knee pain with certain exercises. Patient had no reports of LBP with any exercises or activities today. Scored an overall 49 out of 56 with BERG balance assessment with greatest difficulty with SLS and tandem stance. Extensor lag noted with B SLR and experienced knee pain with sit to  stands and L SLR.    Pt will benefit from skilled therapeutic intervention in order to improve on the following deficits Decreased range of motion;Pain;Impaired flexibility;Decreased activity tolerance;Postural dysfunction   Rehab Potential Good   PT Frequency 2x / week   PT Duration 6 weeks   PT Treatment/Interventions Cryotherapy;Electrical Stimulation;Moist Heat;Therapeutic exercise;Balance training;Ultrasound;Neuromuscular re-education;Patient/family education;Manual techniques;Passive range of motion;Dry needling   PT Next Visit Plan Continue core and LE strengthening per MPT POC and balance exercises.   PT Home Exercise Plan HS, quad and gastroc/soleus stretch   Consulted and Agree with Plan of Care Patient        Problem List Patient Active Problem List   Diagnosis Date Noted  . Pelvic mass 01/27/2016  . Periumbilical abdominal pain 01/27/2016  . Colonic ulcer   . Diverticulosis of colon without hemorrhage   . Iron deficiency anemia due to chronic blood loss   . Lumbar pain 12/14/2015  . Chronic kidney disease (CKD), stage II (mild) 10/03/2012  . Carotid artery narrowing 10/03/2012  . Hypothyroidism 10/02/2012  . HTN (hypertension) 01/09/2012  . Hyperlipidemia 01/09/2012  . Anemia 01/09/2012  . B12 deficiency 01/09/2012    Wynelle Fanny, PTA 02/16/2016, 10:18 AM  [facility] [address] [place], [place], [address] Phone: [phone]   Fax:  [phone]  Name: [name] MRN: [id number] Date of Birth: Apr 26, 1946

## 2016-02-21 ENCOUNTER — Encounter: Payer: Self-pay | Admitting: Physical Therapy

## 2016-02-21 ENCOUNTER — Ambulatory Visit: Payer: Medicare Other | Admitting: Physical Therapy

## 2016-02-21 DIAGNOSIS — M545 Low back pain, unspecified: Secondary | ICD-10-CM

## 2016-02-21 DIAGNOSIS — R6889 Other general symptoms and signs: Secondary | ICD-10-CM | POA: Diagnosis not present

## 2016-02-21 DIAGNOSIS — R2681 Unsteadiness on feet: Secondary | ICD-10-CM | POA: Diagnosis not present

## 2016-02-21 NOTE — Therapy (Signed)
[facility] [place], [place], [address] Phone: [phone]   Fax:  [phone]  Physical Therapy Treatment  Patient Details  Name: POLET ALLGOOD MRN: [id number] Date of Birth: 1946/02/08 Referring Provider: Claretta Fraise MD  Encounter Date: 02/21/2016      PT End of Session - 02/21/16 1021    Visit Number 3   Number of Visits 16   PT Start Time 0949   PT Stop Time 1029   PT Time Calculation (min) 40 min   Activity Tolerance Patient tolerated treatment well   Behavior During Therapy [facility] for tasks assessed/performed      Past Medical History  Diagnosis Date  . Hypertension   . Menopause   . Hypothyroid   . Mini stroke ([place])   . Hyperlipidemia   . B12 deficiency   . Shortness of breath   . Headache(784.0)   . Anxiety   . Pneumonia   . Anemia   . Depression   . Stroke [facility]) 2010    no deficits  . Alcoholism ([place]) 01/09/2012  . Hyponatremia 01/09/2012  . Chronic kidney disease (CKD), stage II (mild)   . COPD (chronic obstructive pulmonary disease) ([place])   . Orthostatic syncope 10/02/2012  . Carotid artery narrowing 10/03/2012    On the right.    Past Surgical History  Procedure Laterality Date  . Appendectomy    . Tonsillectomy    . Cataract extraction w/phaco  06/20/2012    Procedure: CATARACT EXTRACTION PHACO AND INTRAOCULAR LENS PLACEMENT (IOC);  Surgeon: Tonny Branch, MD;  Location: [facility];  Service: Ophthalmology;  Laterality: Right;  CDE:10.66  . Cataract extraction w/phaco  07/11/2012    Procedure: CATARACT EXTRACTION PHACO AND INTRAOCULAR LENS PLACEMENT (IOC);  Surgeon: Tonny Branch, MD;  Location: [facility];  Service: Ophthalmology;  Laterality: Left;  CDE: 12.69  . Colonoscopy N/A 12/17/2015    RMR: Marked circumferential ulceration of the ascending colon/Ileocecal valve ulceration with biopsy most consistent with NSAID injury versus inflammatory bowel disease, pancolonoc diverticulosis redundant colon  .  Esophagogastroduodenoscopy N/A 12/17/2015    [id number] gastric polyp, biopsy benign    There were no vitals filed for this visit.  Visit Diagnosis:  Midline low back pain without sciatica  Activity intolerance  Unsteadiness      Subjective Assessment - 02/21/16 1011    Subjective patient reports no significant back pain compaired to legs hurting   Pertinent History HTN, COPD, left knee pain   Limitations Standing   How long can you stand comfortably? 30 min   Diagnostic tests xray shows anterior compression L1-3 and Gd 1 anterolisthesis of L3 on L4   Patient Stated Goals to be able to do what I used to do like walk 2 miles, vacuum clean and cook.   Currently in Pain? Yes   Pain Score 9    Pain Location Leg   Pain Orientation Right;Left   Pain Descriptors / Indicators Throbbing;Sore   Pain Type Acute pain   Pain Onset More than a month ago   Pain Frequency Intermittent   Aggravating Factors  standing activities   Pain Relieving Factors rest                         [facility] Adult PT Treatment/Exercise - 02/21/16 0001    Knee/Hip Exercises: Aerobic   Nustep NuStep L3 x10 min   Knee/Hip Exercises: Standing   Rocker Board 3 minutes   Other Standing  Knee Exercises balance on airex x1min   Other Standing Knee Exercises 6 inch toe taps 2x5 no UE support   Knee/Hip Exercises: Seated   Long Arc Quad Strengthening;Both;2 sets;10 reps;Weights   Long Arc Quad Weight 2 lbs.   Sit to Sand 20 reps;without UE support   Knee/Hip Exercises: Supine   Bridges Strengthening;Both;2 sets;10 reps   Straight Leg Raises Strengthening;Both;2 sets;10 reps   Knee/Hip Exercises: Sidelying   Hip ABduction Strengthening;Both;2 sets;10 reps                     PT Long Term Goals - 02/16/16 1016    PT LONG TERM GOAL #1   Title I with HEP   Time 6   Period Weeks   Status Achieved   PT LONG TERM GOAL #2   Title decreased pain with ADLS to [id number] or less.   Time 6    Period Weeks   Status On-going   PT LONG TERM GOAL #3   Title no falls reported in previous 4 weeks.   Time 6   Period Weeks   Status On-going               Plan - 02/21/16 1022    Clinical Impression Statement Patient tolerated treatment well today, had some difficulty due to pain in bil LE esp knees per patient. Patient reported no falls since she has started therapy. Patient was educated on using a cane for safety when out in community and take rest breaks when needed to avoid fatigue and any further falls. goals ongoing due to  pain and balance deficts.    Pt will benefit from skilled therapeutic intervention in order to improve on the following deficits Decreased range of motion;Pain;Impaired flexibility;Decreased activity tolerance;Postural dysfunction   Rehab Potential Good   PT Frequency 2x / week   PT Duration 6 weeks   PT Treatment/Interventions Cryotherapy;Electrical Stimulation;Moist Heat;Therapeutic exercise;Balance training;Ultrasound;Neuromuscular re-education;Patient/family education;Manual techniques;Passive range of motion;Dry needling   PT Next Visit Plan Continue core and LE strengthening per MPT POC and balance exercises.   Consulted and Agree with Plan of Care Patient        Problem List Patient Active Problem List   Diagnosis Date Noted  . Pelvic mass 01/27/2016  . Periumbilical abdominal pain 01/27/2016  . Colonic ulcer   . Diverticulosis of colon without hemorrhage   . Iron deficiency anemia due to chronic blood loss   . Lumbar pain 12/14/2015  . Chronic kidney disease (CKD), stage II (mild) 10/03/2012  . Carotid artery narrowing 10/03/2012  . Hypothyroidism 10/02/2012  . HTN (hypertension) 01/09/2012  . Hyperlipidemia 01/09/2012  . Anemia 01/09/2012  . B12 deficiency 01/09/2012    Phillips Climes, PTA 02/21/2016, 10:31 AM  [facility] [place], [place], [address] Phone:  [phone]   Fax:  [phone]  Name: ABREA INDOVINA MRN: [id number] Date of Birth: 06/10/1946

## 2016-02-22 ENCOUNTER — Ambulatory Visit (HOSPITAL_COMMUNITY)
Admission: RE | Admit: 2016-02-22 | Discharge: 2016-02-22 | Disposition: A | Discharge status: Home / Self Care | Payer: Medicare Other | Source: Ambulatory Visit | Attending: Internal Medicine | Admitting: Internal Medicine

## 2016-02-22 ENCOUNTER — Encounter (HOSPITAL_COMMUNITY)
Admission: RE | Disposition: A | Discharge status: Home / Self Care | Payer: Self-pay | Source: Ambulatory Visit | Attending: Internal Medicine

## 2016-02-22 DIAGNOSIS — K625 Hemorrhage of anus and rectum: Secondary | ICD-10-CM | POA: Insufficient documentation

## 2016-02-22 HISTORY — PX: AGILE CAPSULE: SHX5420

## 2016-02-22 SURGERY — AGILE CAPSULE

## 2016-02-22 MED ORDER — SODIUM CHLORIDE 0.9 % IV SOLN: INTRAVENOUS | Status: DC

## 2016-02-23 ENCOUNTER — Other Ambulatory Visit: Payer: Self-pay

## 2016-02-23 ENCOUNTER — Encounter (HOSPITAL_COMMUNITY): Payer: Self-pay | Admitting: Internal Medicine

## 2016-02-23 ENCOUNTER — Ambulatory Visit (HOSPITAL_COMMUNITY)
Admission: RE | Admit: 2016-02-23 | Discharge: 2016-02-23 | Disposition: A | Discharge status: Home / Self Care | Payer: Medicare Other | Source: Ambulatory Visit | Attending: Internal Medicine | Admitting: Internal Medicine

## 2016-02-23 DIAGNOSIS — K633 Ulcer of intestine: Secondary | ICD-10-CM | POA: Diagnosis not present

## 2016-02-23 DIAGNOSIS — Z87821 Personal history of retained foreign body fully removed: Secondary | ICD-10-CM | POA: Diagnosis not present

## 2016-02-23 NOTE — Progress Notes (Signed)
Quick Note:  Patient needs to return for another Abd xray 1 view tomorrow morning. ______

## 2016-02-24 ENCOUNTER — Ambulatory Visit (HOSPITAL_COMMUNITY)
Admission: RE | Admit: 2016-02-24 | Discharge: 2016-02-24 | Disposition: A | Discharge status: Home / Self Care | Payer: Medicare Other | Source: Ambulatory Visit | Attending: Gastroenterology | Admitting: Gastroenterology

## 2016-02-24 DIAGNOSIS — H59219 Accidental puncture and laceration of unspecified eye and adnexa during an ophthalmic procedure: Secondary | ICD-10-CM | POA: Diagnosis not present

## 2016-02-24 DIAGNOSIS — K633 Ulcer of intestine: Secondary | ICD-10-CM | POA: Insufficient documentation

## 2016-02-25 NOTE — Progress Notes (Signed)
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 I will discuss with Dr. Gala Romney. I would not offer her the real capsule endoscopy based on these finding and increased likelihood that the capsule would not pass on its own.  Known ulcerated proximal colon/ICV. Ileum could not be intubated at time of colonoscopy. ______

## 2016-02-28 ENCOUNTER — Telehealth: Payer: Self-pay | Admitting: Family Medicine

## 2016-02-28 DIAGNOSIS — F32A Depression, unspecified: Secondary | ICD-10-CM

## 2016-02-28 DIAGNOSIS — F329 Major depressive disorder, single episode, unspecified: Secondary | ICD-10-CM

## 2016-02-28 MED ORDER — DULOXETINE HCL 60 MG PO CPEP: 60.0000 mg | ORAL_CAPSULE | Freq: Every day | ORAL | Status: DC

## 2016-02-28 NOTE — Telephone Encounter (Signed)
done

## 2016-02-28 NOTE — Progress Notes (Signed)
Patient relatively high risk for capsule retention. If further imaging of small bowel needed could consider MRE versus CTE. Elevated creatinine noted.

## 2016-03-07 ENCOUNTER — Other Ambulatory Visit: Payer: Self-pay | Admitting: Family Medicine

## 2016-03-07 MED ORDER — CYCLOBENZAPRINE HCL 10 MG PO TABS: 10.0000 mg | ORAL_TABLET | Freq: Three times a day (TID) | ORAL | Status: DC | PRN

## 2016-03-07 NOTE — Telephone Encounter (Signed)
Please review and advise.

## 2016-03-10 ENCOUNTER — Ambulatory Visit (INDEPENDENT_AMBULATORY_CARE_PROVIDER_SITE_OTHER): Payer: Medicare Other | Admitting: Family Medicine

## 2016-03-10 ENCOUNTER — Telehealth: Payer: Self-pay | Admitting: Internal Medicine

## 2016-03-10 ENCOUNTER — Other Ambulatory Visit: Payer: Self-pay

## 2016-03-10 ENCOUNTER — Encounter: Payer: Self-pay | Admitting: Family Medicine

## 2016-03-10 VITALS — BP 106/64 | HR 110 | Temp 97.0°F | Ht 68.0 in | Wt 157.2 lb

## 2016-03-10 DIAGNOSIS — M545 Low back pain, unspecified: Secondary | ICD-10-CM

## 2016-03-10 DIAGNOSIS — R7401 Elevation of levels of liver transaminase levels: Secondary | ICD-10-CM | POA: Insufficient documentation

## 2016-03-10 DIAGNOSIS — I10 Essential (primary) hypertension: Secondary | ICD-10-CM

## 2016-03-10 DIAGNOSIS — R7989 Other specified abnormal findings of blood chemistry: Secondary | ICD-10-CM

## 2016-03-10 DIAGNOSIS — R740 Nonspecific elevation of levels of transaminase and lactic acid dehydrogenase [LDH]: Secondary | ICD-10-CM | POA: Diagnosis not present

## 2016-03-10 DIAGNOSIS — R945 Abnormal results of liver function studies: Principal | ICD-10-CM

## 2016-03-10 NOTE — Telephone Encounter (Signed)
Routing to Leslie

## 2016-03-10 NOTE — Telephone Encounter (Signed)
Dr Livia Snellen at [facility]) called to say that patient failed her dummy capsule study and Dr Livia Snellen wants to know what the plan is from here. Please advise and call [phone]

## 2016-03-10 NOTE — Progress Notes (Signed)
Subjective:  Patient ID: [name], female    DOB: Dec 20, 1945  Age: 70 y.o. MRN: [id number]  CC: Back Pain   HPI [name] presents for diclofenac was helping. GI camera didn't go through . Now she doesn't know what the next step is. Due to frequent trips for GI she had to stop PT. Back pain remains moderate. Midline lower back, No relief with current tx regimen.  Elevated liver functions led to DC of NSAID. That had been helping some.   History [name] has a past medical history of Hypertension; Menopause; Hypothyroid; Mini stroke ([name]); Hyperlipidemia; B12 deficiency; Shortness of breath; Headache(784.0); Anxiety; Pneumonia; Anemia; Depression; Stroke [facility]) (2010); Alcoholism ([place]) (01/09/2012); Hyponatremia (01/09/2012); Chronic kidney disease (CKD), stage II (mild); COPD (chronic obstructive pulmonary disease) ([place]); Orthostatic syncope (10/02/2012); and Carotid artery narrowing (10/03/2012).   She has past surgical history that includes Appendectomy; Tonsillectomy; Cataract extraction w/PHACO (06/20/2012); Cataract extraction w/PHACO (07/11/2012); Colonoscopy (N/A, 12/17/2015); Esophagogastroduodenoscopy (N/A, 12/17/2015); and Agile capsule (N/A, 02/22/2016).   Her family history includes GER disease in her mother; Hypertension in her maternal grandmother.She reports that she quit smoking about 23 years ago. Her smoking use included Cigarettes. She has a 60 pack-year smoking history. She does not have any smokeless tobacco history on file. She reports that she drinks about 7.0 oz of alcohol per week. She reports that she does not use illicit drugs.    ROS Review of Systems  Constitutional: Negative for fever, activity change and appetite change.  HENT: Negative for congestion, rhinorrhea and sore throat.   Eyes: Negative for visual disturbance.  Respiratory: Negative for cough and shortness of breath.   Cardiovascular: Negative for chest pain and palpitations.  Gastrointestinal:  Negative for nausea, abdominal pain and diarrhea.  Genitourinary: Negative for dysuria.  Musculoskeletal: Positive for myalgias, back pain and arthralgias.    Objective:  BP 106/64 mmHg  Pulse 110  Temp(Src) 97 F (36.1 C) (Oral)  Ht 5\' 8"  (1.727 m)  Wt 157 lb 3.2 oz (71.305 kg)  BMI 23.91 kg/m2  LMP 08/21/2013  BP Readings from Last 3 Encounters:  03/10/16 106/64  02/07/16 111/72  01/27/16 117/73    Wt Readings from Last 3 Encounters:  03/10/16 157 lb 3.2 oz (71.305 kg)  02/22/16 159 lb (72.122 kg)  02/07/16 159 lb (72.122 kg)     Physical Exam  Constitutional: She appears well-developed and well-nourished.  HENT:  Head: Normocephalic.  Cardiovascular: Normal rate and regular rhythm.   No murmur heard. Pulmonary/Chest: Effort normal and breath sounds normal.  Musculoskeletal: Normal range of motion. She exhibits tenderness (midline lower back. Negative SLR).     Lab Results  Component Value Date   WBC 6.2 02/07/2016   HGB 11.1* 01/24/2016   HCT 34.3 02/07/2016   PLT 285 02/07/2016   GLUCOSE 82 02/07/2016   CHOL 210* 08/05/2015   TRIG 81 08/05/2015   HDL 48 08/05/2015   LDLCALC 146* 08/05/2015   ALT 7 02/07/2016   AST 8 02/07/2016   NA 139 02/07/2016   K 5.6* 02/07/2016   CL 101 02/07/2016   CREATININE 1.65* 02/07/2016   BUN 32* 02/07/2016   CO2 20 02/07/2016   TSH 9.200* 12/14/2015   INR 1.14 12/16/2015   HGBA1C 4.7 11/13/2013    Dg Abd 1 View  02/24/2016  CLINICAL DATA:  Evaluate Agile capsule EXAM: ABDOMEN - 1 VIEW COMPARISON:  KUB of 02/23/2016 FINDINGS: The agile capsule overlies the right lower quadrant probably within distal  ileum. No bowel obstruction is seen. There is a moderate amount of feces throughout the entire colon. The bones are osteopenic. IMPRESSION: Agile capsule is within the right lower quadrant probably in the region of the distal or terminal ileum. Electronically Signed   By: Ivar Drape M.D.   On: 02/24/2016 14:42     Assessment & Plan:   [name] was seen today for back pain.  Diagnoses and all orders for this visit:  Elevated LFTs -     Hepatic function panel  Right-sided low back pain without sciatica  Essential hypertension  Transaminitis      I am having [name] maintain her acetaminophen, thiamine, levothyroxine, valsartan, folic acid, DULoxetine, cyclobenzaprine, and aspirin EC.  Meds ordered this encounter  Medications  . aspirin EC 81 MG tablet    Sig: Take 81 mg by mouth daily.   Resume P.T.  If LFTs are back to normal consider resuming NSAID  Follow-up: Return in about 6 weeks (around 04/21/2016).  [name], M.D.

## 2016-03-11 LAB — HEPATIC FUNCTION PANEL
ALT: 8 IU/L (ref 0–32)
AST: 8 IU/L (ref 0–40)
Albumin: 4.2 g/dL (ref 3.6–4.8)
Alkaline Phosphatase: 108 IU/L (ref 39–117)
Bilirubin Total: <0.2 mg/dL (ref 0.0–1.2)
Bilirubin, Direct: 0.06 mg/dL (ref 0.00–0.40)
Total Protein: 6.7 g/dL (ref 6.0–8.5)

## 2016-03-14 ENCOUNTER — Ambulatory Visit: Payer: Medicare Other | Attending: Family Medicine | Admitting: Physical Therapy

## 2016-03-14 ENCOUNTER — Encounter: Payer: Self-pay | Admitting: Physical Therapy

## 2016-03-14 DIAGNOSIS — R6889 Other general symptoms and signs: Secondary | ICD-10-CM | POA: Diagnosis not present

## 2016-03-14 DIAGNOSIS — R2681 Unsteadiness on feet: Secondary | ICD-10-CM | POA: Diagnosis not present

## 2016-03-14 DIAGNOSIS — M545 Low back pain, unspecified: Secondary | ICD-10-CM

## 2016-03-14 NOTE — Therapy (Signed)
[facility] [place], [place], [address] Phone: [phone]   Fax:  [phone]  Physical Therapy Treatment  Patient Details  Name: [name] MRN: [id number] Date of Birth: 15-Apr-1946 Referring Provider: Claretta Fraise MD  Encounter Date: 03/14/2016      PT End of Session - 03/14/16 0905    Visit Number 4   Number of Visits 16   Date for PT Re-Evaluation 04/10/16   PT Start Time 0903   PT Stop Time 0943   PT Time Calculation (min) 40 min   Activity Tolerance Patient tolerated treatment well   Behavior During Therapy [facility] for tasks assessed/performed      Past Medical History  Diagnosis Date  . Hypertension   . Menopause   . Hypothyroid   . Mini stroke ([place])   . Hyperlipidemia   . B12 deficiency   . Shortness of breath   . Headache(784.0)   . Anxiety   . Pneumonia   . Anemia   . Depression   . Stroke [facility]) 2010    no deficits  . Alcoholism ([place]) 01/09/2012  . Hyponatremia 01/09/2012  . Chronic kidney disease (CKD), stage II (mild)   . COPD (chronic obstructive pulmonary disease) ([place])   . Orthostatic syncope 10/02/2012  . Carotid artery narrowing 10/03/2012    On the right.    Past Surgical History  Procedure Laterality Date  . Appendectomy    . Tonsillectomy    . Cataract extraction w/phaco  06/20/2012    Procedure: CATARACT EXTRACTION PHACO AND INTRAOCULAR LENS PLACEMENT (IOC);  Surgeon: Tonny Branch, MD;  Location: [facility];  Service: Ophthalmology;  Laterality: Right;  CDE:10.66  . Cataract extraction w/phaco  07/11/2012    Procedure: CATARACT EXTRACTION PHACO AND INTRAOCULAR LENS PLACEMENT (IOC);  Surgeon: Tonny Branch, MD;  Location: [facility]S;  Service: Ophthalmology;  Laterality: Left;  CDE: 12.69  . Colonoscopy N/A 12/17/2015    RMR: Marked circumferential ulceration of the ascending colon/Ileocecal valve ulceration with biopsy most consistent with NSAID injury versus inflammatory bowel disease, pancolonoc  diverticulosis redundant colon  . Esophagogastroduodenoscopy N/A 12/17/2015    [id number] gastric polyp, biopsy benign  . Agile capsule N/A 02/22/2016    Procedure: AGILE CAPSULE;  Surgeon: Daneil Dolin, MD;  Location: [facility] ENDO SUITE;  Service: Endoscopy;  Laterality: N/A;  0700    There were no vitals filed for this visit.  Visit Diagnosis:  Midline low back pain without sciatica  Activity intolerance  Unsteadiness      Subjective Assessment - 03/14/16 0904    Subjective States that knees are hurting her today. States that she hurts all the time and is getting used to the pain. States that her knee catching is getting worse.   Pertinent History HTN, COPD, left knee pain   Limitations Standing   How long can you stand comfortably? 30 min   Diagnostic tests xray shows anterior compression L1-3 and Gd 1 anterolisthesis of L3 on L4   Patient Stated Goals to be able to do what I used to do like walk 2 miles, vacuum clean and cook.   Currently in Pain? Yes   Pain Score 5    Pain Location Knee   Pain Orientation Right;Left   Pain Type Acute pain   Pain Onset More than a month ago            [facility] PT Assessment - 03/14/16 0001    Assessment   Medical Diagnosis LBP without  sciatica   Onset Date/Surgical Date 12/12/15   Next MD Visit end of March   Precautions   Precautions Fall   Precaution Comments Bee sting allergy                     [facility] Adult PT Treatment/Exercise - 03/14/16 0001    Knee/Hip Exercises: Aerobic   Stationary Bike L1 x 15 min   Knee/Hip Exercises: Standing   Heel Raises Both;2 sets;10 reps   Heel Raises Limitations B toe raises x20 reps   Knee/Hip Exercises: Seated   Long Arc Quad Strengthening;Both;2 sets;10 reps;Weights   Long Arc Quad Weight 3 lbs.   Knee/Hip Exercises: Supine   Bridges Strengthening;Both;2 sets;10 reps   Straight Leg Raises Strengthening;Both;2 sets;10 reps   Other Supine Knee/Hip Exercises Supine B marching 2#  x20 reps each   Knee/Hip Exercises: Sidelying   Hip ABduction Strengthening;Both;2 sets;10 reps             Balance Exercises - 03/14/16 0930    Balance Exercises: Standing   Tandem Stance Eyes open;Foam/compliant surface;Upper extremity support 1  x2 min reaching; x3 min tandem stance with no UE support   Rockerboard Lateral;EO;Intermittent UE support  x3 min   Other Standing Exercises B toe taps 6" step x2 min 1 UE support                PT Long Term Goals - 03/14/16 0933    PT LONG TERM GOAL #1   Title I with HEP   Time 6   Period Weeks   Status Achieved   PT LONG TERM GOAL #2   Title decreased pain with ADLS to 4/85 or less.   Time 6   Period Weeks   Status Partially Met  Met regarding LBP but patient states knee pain limits her as of 03/14/2016   PT LONG TERM GOAL #3   Title no falls reported in previous 4 weeks.   Time 6   Period Weeks   Status Achieved  No fall since 02/05/2016 per patient report on 03/14/2016               Plan - 03/14/16 0945    Clinical Impression Statement Patient tolerated today's treatment fiarly well although she reported knee catching and feeling as they are giving out on her today. Was able to complete all seated and supine LE and core strengthening with only complaint of knee catching. Supine bridging and maching were directed with VCs for core activation. Paitent has achieved goals regarding HEP and no falls at this time. Partially achieved goals regarding ADLs as she states LBP does not limit her but her knees limit her. Patient denied pain following today's treatment.   Pt will benefit from skilled therapeutic intervention in order to improve on the following deficits Decreased range of motion;Pain;Impaired flexibility;Decreased activity tolerance;Postural dysfunction   Rehab Potential Good   PT Frequency 2x / week   PT Duration 6 weeks   PT Treatment/Interventions Cryotherapy;Electrical Stimulation;Moist Heat;Therapeutic  exercise;Balance training;Ultrasound;Neuromuscular re-education;Patient/family education;Manual techniques;Passive range of motion;Dry needling   PT Next Visit Plan Continue core and LE strengthening per MPT POC and balance exercises. Assess D/C due to goals being met.   PT Home Exercise Plan HS, quad and gastroc/soleus stretch   Consulted and Agree with Plan of Care Patient        Problem List Patient Active Problem List   Diagnosis Date Noted  . Transaminitis 03/10/2016  . Pelvic mass  01/27/2016  . Periumbilical abdominal pain 01/27/2016  . Colonic ulcer   . Diverticulosis of colon without hemorrhage   . Iron deficiency anemia due to chronic blood loss   . Lumbar pain 12/14/2015  . Chronic kidney disease (CKD), stage II (mild) 10/03/2012  . Carotid artery narrowing 10/03/2012  . Hypothyroidism 10/02/2012  . HTN (hypertension) 01/09/2012  . Hyperlipidemia 01/09/2012  . Anemia 01/09/2012  . B12 deficiency 01/09/2012    Wynelle Fanny, PTA 03/14/2016, 9:55 AM  [facility] [address] [place], [place], [address] Phone: [phone]   Fax:  [phone]  Name: [name] MRN: [id number] Date of Birth: 1946-11-03

## 2016-03-15 ENCOUNTER — Telehealth: Payer: Self-pay | Admitting: Family Medicine

## 2016-03-15 MED ORDER — BUDESONIDE 3 MG PO CPEP: 9.0000 mg | ORAL_CAPSULE | Freq: Every day | ORAL | Status: DC

## 2016-03-15 NOTE — Telephone Encounter (Signed)
Tried to call pt- NA 

## 2016-03-15 NOTE — Progress Notes (Signed)
Quick Note:  See telephone note dated 03/10/16 ______

## 2016-03-15 NOTE — Addendum Note (Signed)
Addended by: Mahala Menghini on: 03/15/2016 03:59 PM   Modules accepted: Orders

## 2016-03-15 NOTE — Telephone Encounter (Signed)
Noed

## 2016-03-15 NOTE — Addendum Note (Signed)
Addended by: Claudina Lick on: 03/15/2016 05:00 PM   Modules accepted: Orders

## 2016-03-15 NOTE — Telephone Encounter (Signed)
[name], pt said it was ok to make her an appointment and just send her a letter.

## 2016-03-15 NOTE — Telephone Encounter (Signed)
Pt is aware of plan. [facility]. I have sent that rx in for her. Asked her to call me if she has any problems getting it.  Manuela Schwartz, please schedule ov with Dr.Rourk in 6 weeks.   I am routing this note to Dr.Stacks as an [place]

## 2016-03-15 NOTE — Telephone Encounter (Signed)
Discussed case at length with Dr. Gala Romney. It is felt that patient likely has NSAID induced injury to right colon (although IBD not entirely excluded). Marked circumferential ulceration of the ascending colon/ileocecal valve. Terminal ileum could not be intubated. Patient failed dummy capsule recently. These findings were in the setting of indomethacin, aspirin, diclofenac. Initially she stopped all NSAIDs but she added back Voltaren for debilitating pain. CT of the abdomen and pelvis with contrast in February showing large amount of fecal matter throughout the colon but no other GI findings.  1. Ideally patient should stop all NSAIDs, at the minimal limit Voltaren as much as possible. 2. Short course of Entocort 9 mg daily for 6 weeks. RX done. Sent to [place]. 3. Return to the office in 6 weeks with Dr. Gala Romney. Based on how she is doing at this time, we may consider repeat dummy capsule versus proceeding with CTE versus MRA (patient has chronic renal insufficiency).   Please let patient know and let Dr. Livia Snellen at [facility]) know.

## 2016-03-16 ENCOUNTER — Encounter: Payer: Self-pay | Admitting: Physical Therapy

## 2016-03-16 ENCOUNTER — Other Ambulatory Visit: Payer: Self-pay

## 2016-03-16 MED ORDER — LEVOTHYROXINE SODIUM 125 MCG PO TABS
125.0000 ug | ORAL_TABLET | Freq: Every day | ORAL | Status: DC
Start: 2016-03-16 — End: 2016-05-15

## 2016-03-16 NOTE — Telephone Encounter (Signed)
OV made and appt card mailed °

## 2016-03-16 NOTE — Telephone Encounter (Signed)
done

## 2016-03-17 ENCOUNTER — Telehealth: Payer: Self-pay | Admitting: Family Medicine

## 2016-03-17 ENCOUNTER — Ambulatory Visit: Payer: Medicare Other | Admitting: Physical Therapy

## 2016-03-17 DIAGNOSIS — R2681 Unsteadiness on feet: Secondary | ICD-10-CM | POA: Diagnosis not present

## 2016-03-17 DIAGNOSIS — M545 Low back pain, unspecified: Secondary | ICD-10-CM

## 2016-03-17 DIAGNOSIS — R6889 Other general symptoms and signs: Secondary | ICD-10-CM | POA: Diagnosis not present

## 2016-03-17 NOTE — Therapy (Signed)
[facility] [place], [place], [address] Phone: [phone]   Fax:  [phone]  Physical Therapy Treatment  Patient Details  Name: [name] MRN: [id number] Date of Birth: [birth year] Referring Provider: Claretta Fraise MD  Encounter Date: 03/17/2016      PT End of Session - 03/17/16 0814    Visit Number 5   Number of Visits 16   Date for PT Re-Evaluation 04/10/16   PT Start Time 0818   PT Stop Time 0900   PT Time Calculation (min) 42 min   Activity Tolerance Patient tolerated treatment well   Behavior During Therapy [facility] for tasks assessed/performed      Past Medical History  Diagnosis Date  . Hypertension   . Menopause   . Hypothyroid   . Mini stroke ([place])   . Hyperlipidemia   . B12 deficiency   . Shortness of breath   . Headache(784.0)   . Anxiety   . Pneumonia   . Anemia   . Depression   . Stroke [facility]) 2010    no deficits  . Alcoholism ([place]) 01/09/2012  . Hyponatremia 01/09/2012  . Chronic kidney disease (CKD), stage II (mild)   . COPD (chronic obstructive pulmonary disease) ([place])   . Orthostatic syncope 10/02/2012  . Carotid artery narrowing 10/03/2012    On the right.    Past Surgical History  Procedure Laterality Date  . Appendectomy    . Tonsillectomy    . Cataract extraction w/phaco  06/20/2012    Procedure: CATARACT EXTRACTION PHACO AND INTRAOCULAR LENS PLACEMENT (IOC);  Surgeon: Tonny Branch, MD;  Location: [facility];  Service: Ophthalmology;  Laterality: Right;  CDE:10.66  . Cataract extraction w/phaco  07/11/2012    Procedure: CATARACT EXTRACTION PHACO AND INTRAOCULAR LENS PLACEMENT (IOC);  Surgeon: Tonny Branch, MD;  Location: [facility];  Service: Ophthalmology;  Laterality: Left;  CDE: 12.69  . Colonoscopy N/A 12/17/2015    RMR: Marked circumferential ulceration of the ascending colon/Ileocecal valve ulceration with biopsy most consistent with NSAID injury versus inflammatory bowel disease, pancolonoc  diverticulosis redundant colon  . Esophagogastroduodenoscopy N/A 12/17/2015    ONG:[id number] gastric polyp, biopsy benign  . Agile capsule N/A 02/22/2016    Procedure: AGILE CAPSULE;  Surgeon: Daneil Dolin, MD;  Location: [facility];  Service: Endoscopy;  Laterality: N/A;  0700    There were no vitals filed for this visit.      Subjective Assessment - 03/17/16 0830    Subjective States that her back is much better only her L knee is bothering her. Reports she is now able to stand for 1.5 hours to cook dinner and clean up afterwards but she tries not to push it.   Pertinent History HTN, COPD, left knee pain   Limitations Standing   How long can you stand comfortably? 1.5 hours   Diagnostic tests xray shows anterior compression L1-3 and Gd 1 anterolisthesis of L3 on L4   Patient Stated Goals to be able to do what I used to do like walk 2 miles, vacuum clean and cook.   Currently in Pain? Yes   Pain Location Knee   Pain Orientation Left   Pain Descriptors / Indicators Sharp   Pain Type Acute pain   Pain Onset More than a month ago            [facility] PT Assessment - 03/17/16 0001    Assessment   Medical Diagnosis LBP without sciatica   Onset Date/Surgical  Date 12/12/15   Next MD Visit end of March   Precautions   Precautions Fall   Precaution Comments Bee sting allergy                     [facility] Adult PT Treatment/Exercise - Apr 09, 2016 0001    Knee/Hip Exercises: Aerobic   Nustep NuStep L6 x15 min   Knee/Hip Exercises: Seated   Long Arc Quad Strengthening;Both;2 sets;10 reps;Weights   Long Arc Quad Weight 4 lbs.   Knee/Hip Exercises: Supine   Bridges Strengthening;Both;2 sets;10 reps   Straight Leg Raises Strengthening;Both;2 sets;10 reps  with VCs for core activation   Other Supine Knee/Hip Exercises Supine B marching x20 reps each with VCs for core activation   Knee/Hip Exercises: Sidelying   Clams B AROM clamshells x20 reps each              Balance Exercises - 09-Apr-2016 0854    Balance Exercises: Standing   Standing Eyes Opened Narrow base of support (BOS);Foam/compliant surface  x3 min with reaching across midline, sides and above head   Tandem Stance Eyes open;Foam/compliant surface;Intermittent upper extremity support  x2 min but d/c due to knee pain per patient report   Rockerboard Lateral;EO;Intermittent UE support  x3 min                PT Long Term Goals - 04-09-16 0942    PT LONG TERM GOAL #1   Title I with HEP   Time 6   Period Weeks   Status Achieved   PT LONG TERM GOAL #2   Title decreased pain with ADLS to 0/94 or less.   Time 6   Period Weeks   Status Achieved  Met regarding LBP but patient states knee pain limits her as of Apr 09, 2016   PT LONG TERM GOAL #3   Title no falls reported in previous 4 weeks.   Time 6   Period Weeks   Status Achieved  No fall since 02/05/2016 per patient report on 03/14/2016               Plan - 04/09/2016 0908    Clinical Impression Statement Patient tolerated today's treatment well although she continues to have pain in her knees. Patient able to complete all exercises directed today well with minimal to moderate multimodal cueing. Completed all supine exercises with VCs for core activation to further strengthen core musculature. Patient noted that her knees were popping duirng seated LAQ today. Patient limited with standing tandem stance due to knee pain. Completed all other balance exercises well with no reports of pain. Met all goals for LBP set at evaluation only limited by knee pain per patient report. Patient denied pain following today's treatment.   Rehab Potential Good   PT Frequency 2x / week   PT Duration 6 weeks   PT Treatment/Interventions Cryotherapy;Electrical Stimulation;Moist Heat;Therapeutic exercise;Balance training;Ultrasound;Neuromuscular re-education;Patient/family education;Manual techniques;Passive range of motion;Dry needling   PT Next  Visit Plan D/C summary reqiured to be completed by MPT.   PT Home Exercise Plan HS, quad and gastroc/soleus stretch   Consulted and Agree with Plan of Care Patient      Patient will benefit from skilled therapeutic intervention in order to improve the following deficits and impairments:  Decreased range of motion, Pain, Impaired flexibility, Decreased activity tolerance, Postural dysfunction  Visit Diagnosis: Midline low back pain without sciatica  Unsteadiness on feet       G-Codes - Apr 09, 2016 1221  Functional Assessment Tool Used FOTO..d/c...18% limitation.   Functional Limitation Mobility: Walking and moving around   Mobility: Walking and Moving Around Current Status [phone]) At least 1 percent but less than 20 percent impaired, limited or restricted   Mobility: Walking and Moving Around Goal Status [phone]) At least 40 percent but less than 60 percent impaired, limited or restricted   Mobility: Walking and Moving Around Discharge Status [phone]) At least 1 percent but less than 20 percent impaired, limited or restricted      Problem List Patient Active Problem List   Diagnosis Date Noted  . Transaminitis 03/10/2016  . Pelvic mass 01/27/2016  . Periumbilical abdominal pain 01/27/2016  . Colonic ulcer   . Diverticulosis of colon without hemorrhage   . Iron deficiency anemia due to chronic blood loss   . Lumbar pain 12/14/2015  . Chronic kidney disease (CKD), stage II (mild) 10/03/2012  . Carotid artery narrowing 10/03/2012  . Hypothyroidism 10/02/2012  . HTN (hypertension) 01/09/2012  . Hyperlipidemia 01/09/2012  . Anemia 01/09/2012  . B12 deficiency 01/09/2012   PHYSICAL THERAPY DISCHARGE SUMMARY  Visits from Start of Care:   Current functional level related to goals / functional outcomes: Please see above.   Remaining deficits: All goals met.   Education / Equipment: HEP.  Plan: Patient agrees to discharge.  Patient goals were met. Patient is being discharged  due to meeting the stated rehab goals.  ?????      Lacresia Darwish, [place] MPT 03/17/2016, 12:30 PM  [facility] [address] [place], [place], [address] Phone: [phone]   Fax:  [phone]  Name: [name] MRN: [id number] Date of Birth: 1946/03/04

## 2016-03-17 NOTE — Addendum Note (Signed)
Addended by: [place], [place] W on: 03/17/2016 01:57 PM   Modules accepted: Orders

## 2016-03-17 NOTE — Therapy (Addendum)
[facility] [place], [place], [address] Phone: [phone]   Fax:  [phone]  Physical Therapy Treatment  Patient Details  Name: [name] MRN: [id number] Date of Birth: 1946-06-30 Referring Provider: Claretta Fraise MD  Encounter Date: 03/17/2016      PT End of Session - 03/17/16 0814    Visit Number 5   Number of Visits 16   Date for PT Re-Evaluation 05/17/16   PT Start Time 0818   PT Stop Time 0900   PT Time Calculation (min) 42 min   Activity Tolerance Patient tolerated treatment well   Behavior During Therapy [facility] for tasks assessed/performed      Past Medical History  Diagnosis Date  . Hypertension   . Menopause   . Hypothyroid   . Mini stroke ([place])   . Hyperlipidemia   . B12 deficiency   . Shortness of breath   . Headache(784.0)   . Anxiety   . Pneumonia   . Anemia   . Depression   . Stroke [facility]) 2010    no deficits  . Alcoholism ([place]) 01/09/2012  . Hyponatremia 01/09/2012  . Chronic kidney disease (CKD), stage II (mild)   . COPD (chronic obstructive pulmonary disease) ([place])   . Orthostatic syncope 10/02/2012  . Carotid artery narrowing 10/03/2012    On the right.    Past Surgical History  Procedure Laterality Date  . Appendectomy    . Tonsillectomy    . Cataract extraction w/phaco  06/20/2012    Procedure: CATARACT EXTRACTION PHACO AND INTRAOCULAR LENS PLACEMENT (IOC);  Surgeon: Tonny Branch, MD;  Location: [facility] ORS;  Service: Ophthalmology;  Laterality: Right;  CDE:10.66  . Cataract extraction w/phaco  07/11/2012    Procedure: CATARACT EXTRACTION PHACO AND INTRAOCULAR LENS PLACEMENT (IOC);  Surgeon: Tonny Branch, MD;  Location: [facility] ORS;  Service: Ophthalmology;  Laterality: Left;  CDE: 12.69  . Colonoscopy N/A 12/17/2015    RMR: Marked circumferential ulceration of the ascending colon/Ileocecal valve ulceration with biopsy most consistent with NSAID injury versus inflammatory bowel disease, pancolonoc  diverticulosis redundant colon  . Esophagogastroduodenoscopy N/A 12/17/2015    [id number] gastric polyp, biopsy benign  . Agile capsule N/A 02/22/2016    Procedure: AGILE CAPSULE;  Surgeon: Daneil Dolin, MD;  Location: [facility] ENDO SUITE;  Service: Endoscopy;  Laterality: N/A;  0700    There were no vitals filed for this visit.      Subjective Assessment - 03/17/16 0830    Subjective States that her back is much better only her L knee is bothering her. Reports she is now able to stand for 1.5 hours to cook dinner and clean up afterwards but she tries not to push it.   Pertinent History HTN, COPD, left knee pain   Limitations Standing   How long can you stand comfortably? 1.5 hours   Diagnostic tests xray shows anterior compression L1-3 and Gd 1 anterolisthesis of L3 on L4   Patient Stated Goals to be able to do what I used to do like walk 2 miles, vacuum clean and cook.   Currently in Pain? Yes   Pain Location Knee   Pain Orientation Left   Pain Descriptors / Indicators Sharp   Pain Type Acute pain   Pain Onset More than a month ago            [facility] PT Assessment - 03/17/16 0001    Assessment   Medical Diagnosis LBP without sciatica   Onset Date/Surgical  Date 12/12/15   Next MD Visit end of March   Precautions   Precautions Fall   Precaution Comments Bee sting allergy                     [facility] Adult PT Treatment/Exercise - 03/17/16 0001    Knee/Hip Exercises: Aerobic   Nustep NuStep L6 x15 min   Knee/Hip Exercises: Seated   Long Arc Quad Strengthening;Both;2 sets;10 reps;Weights   Long Arc Quad Weight 4 lbs.   Knee/Hip Exercises: Supine   Bridges Strengthening;Both;2 sets;10 reps   Straight Leg Raises Strengthening;Both;2 sets;10 reps  with VCs for core activation   Other Supine Knee/Hip Exercises Supine B marching x20 reps each with VCs for core activation   Knee/Hip Exercises: Sidelying   Clams B AROM clamshells x20 reps each              Balance Exercises - 03/17/16 0854    Balance Exercises: Standing   Standing Eyes Opened Narrow base of support (BOS);Foam/compliant surface  x3 min with reaching across midline, sides and above head   Tandem Stance Eyes open;Foam/compliant surface;Intermittent upper extremity support  x2 min but d/c due to knee pain per patient report   Rockerboard Lateral;EO;Intermittent UE support  x3 min                PT Long Term Goals - 03/17/16 0942    PT LONG TERM GOAL #1   Title I with HEP   Time 6   Period Weeks   Status Achieved   PT LONG TERM GOAL #2   Title decreased pain with ADLS to 3/24 or less.   Time 6   Period Weeks   Status Achieved  Met regarding LBP but patient states knee pain limits her as of 03/17/2016   PT LONG TERM GOAL #3   Title no falls reported in previous 4 weeks.   Time 6   Period Weeks   Status Achieved  No fall since 02/05/2016 per patient report on 03/14/2016               Plan - 03/17/16 0908    Clinical Impression Statement Patient tolerated today's treatment well although she continues to have pain in her knees. Patient able to complete all exercises directed today well with minimal to moderate multimodal cueing. Completed all supine exercises with VCs for core activation to further strengthen core musculature. Patient noted that her knees were popping duirng seated LAQ today. Patient limited with standing tandem stance due to knee pain. Completed all other balance exercises well with no reports of pain. Met all goals for LBP set at evaluation only limited by knee pain per patient report. Patient denied pain following today's treatment.   Rehab Potential Good   PT Frequency 2x / week   PT Duration 6 weeks   PT Treatment/Interventions Cryotherapy;Electrical Stimulation;Moist Heat;Therapeutic exercise;Balance training;Ultrasound;Neuromuscular re-education;Patient/family education;Manual techniques;Passive range of motion;Dry needling   PT Next  Visit Plan D/C summary reqiured to be completed by MPT.   PT Home Exercise Plan HS, quad and gastroc/soleus stretch   Consulted and Agree with Plan of Care Patient      Patient will benefit from skilled therapeutic intervention in order to improve the following deficits and impairments:  Decreased range of motion, Pain, Impaired flexibility, Decreased activity tolerance, Postural dysfunction  Visit Diagnosis: Midline low back pain without sciatica - Plan: PT plan of care cert/re-cert  Unsteadiness on feet - Plan: PT plan of care  cert/re-cert       G-Codes - 03/17/16 1221    Functional Assessment Tool Used FOTO..d/c...18% limitation.   Functional Limitation Mobility: Walking and moving around   Mobility: Walking and Moving Around Current Status [phone]) At least 1 percent but less than 20 percent impaired, limited or restricted   Mobility: Walking and Moving Around Goal Status [phone]) At least 40 percent but less than 60 percent impaired, limited or restricted   Mobility: Walking and Moving Around Discharge Status [phone]) At least 1 percent but less than 20 percent impaired, limited or restricted      Problem List Patient Active Problem List   Diagnosis Date Noted  . Transaminitis 03/10/2016  . Pelvic mass 01/27/2016  . Periumbilical abdominal pain 01/27/2016  . Colonic ulcer   . Diverticulosis of colon without hemorrhage   . Iron deficiency anemia due to chronic blood loss   . Lumbar pain 12/14/2015  . Chronic kidney disease (CKD), stage II (mild) 10/03/2012  . Carotid artery narrowing 10/03/2012  . Hypothyroidism 10/02/2012  . HTN (hypertension) 01/09/2012  . Hyperlipidemia 01/09/2012  . Anemia 01/09/2012  . B12 deficiency 01/09/2012    Ahmed Prima, PTA 03/17/2016 1:57 PM Mali Applegate MPT [facility] [address] [place], [place], [address] Phone: [phone]   Fax:  [phone]  Name: [name] MRN:  [id number] Date of Birth: [birth year]

## 2016-03-22 NOTE — Telephone Encounter (Signed)
Left detailed message.   

## 2016-03-22 NOTE — Telephone Encounter (Signed)
Yes

## 2016-03-28 ENCOUNTER — Telehealth: Payer: Self-pay | Admitting: Family Medicine

## 2016-03-29 MED ORDER — DICLOFENAC SODIUM 75 MG PO TBEC
75.0000 mg | DELAYED_RELEASE_TABLET | Freq: Two times a day (BID) | ORAL | Status: DC
Start: 2016-03-29 — End: 2016-06-28

## 2016-03-29 NOTE — Addendum Note (Signed)
Addended by: Wardell Heath on: 03/29/2016 09:27 AM   Modules accepted: Orders

## 2016-03-29 NOTE — Telephone Encounter (Signed)
Okay for diclofenac at current level for 6 mos. Thanks ws

## 2016-03-29 NOTE — Telephone Encounter (Signed)
Patient called with questions regarding.  Lab results and Diclofenac.  Patient states that she is not sure if she was to start back taking Diclofenac since labs were normal.  If she is she will need this medication sent to her pharmacy.

## 2016-04-20 ENCOUNTER — Other Ambulatory Visit: Payer: Self-pay

## 2016-04-20 DIAGNOSIS — F1020 Alcohol dependence, uncomplicated: Secondary | ICD-10-CM

## 2016-04-20 MED ORDER — VITAMIN B-1 100 MG PO TABS: 100.0000 mg | ORAL_TABLET | Freq: Every day | ORAL | Status: DC

## 2016-04-28 ENCOUNTER — Encounter: Payer: Self-pay | Admitting: Internal Medicine

## 2016-04-28 ENCOUNTER — Other Ambulatory Visit: Payer: Medicare Other

## 2016-04-28 ENCOUNTER — Ambulatory Visit (INDEPENDENT_AMBULATORY_CARE_PROVIDER_SITE_OTHER): Payer: Medicare Other | Admitting: Internal Medicine

## 2016-04-28 VITALS — BP 85/59 | HR 112 | Temp 98.2°F | Ht 69.0 in | Wt 158.8 lb

## 2016-04-28 DIAGNOSIS — D649 Anemia, unspecified: Secondary | ICD-10-CM | POA: Diagnosis not present

## 2016-04-28 NOTE — Progress Notes (Signed)
Primary Care Physician:  Claretta Fraise, MD Primary Gastroenterologist:  Dr. Gala Romney  Pre-Procedure History & Physical: HPI:  [name] is a 70 y.o. female here for followup of significant iron deficiency anemia from GI bleeding secondary to rather extensive ileocecal valve/right colon ulceration. Biopsies benign. In retrospect, likely related to polyuria and said use. She stop using NSAIDs altogether. She presented earlier this year profound anemia or transfusion. Hyperplastic polyp removed from her stomach-negative for malignancy/H. Pylori. Doing very well. Her CBC has rebounded nicely. She has no GI symptoms currently and is on her GI meds. No adenomas at her first ever colonoscopy earlier this year. No family history of colon cancer.  Past Medical History  Diagnosis Date  . Hypertension   . Menopause   . Hypothyroid   . Mini stroke ([place])   . Hyperlipidemia   . B12 deficiency   . Shortness of breath   . Headache(784.0)   . Anxiety   . Pneumonia   . Anemia   . Depression   . Stroke [facility]) 2010    no deficits  . Alcoholism ([place]) 01/09/2012  . Hyponatremia 01/09/2012  . Chronic kidney disease (CKD), stage II (mild)   . COPD (chronic obstructive pulmonary disease) ([place])   . Orthostatic syncope 10/02/2012  . Carotid artery narrowing 10/03/2012    On the right.    Past Surgical History  Procedure Laterality Date  . Appendectomy    . Tonsillectomy    . Cataract extraction w/phaco  06/20/2012    Procedure: CATARACT EXTRACTION PHACO AND INTRAOCULAR LENS PLACEMENT (IOC);  Surgeon: Tonny Branch, MD;  Location: [facility] ORS;  Service: Ophthalmology;  Laterality: Right;  CDE:10.66  . Cataract extraction w/phaco  07/11/2012    Procedure: CATARACT EXTRACTION PHACO AND INTRAOCULAR LENS PLACEMENT (IOC);  Surgeon: Tonny Branch, MD;  Location: [facility] ORS;  Service: Ophthalmology;  Laterality: Left;  CDE: 12.69  . Colonoscopy N/A 12/17/2015    RMR: Marked circumferential ulceration of the ascending  colon/Ileocecal valve ulceration with biopsy most consistent with NSAID injury versus inflammatory bowel disease, pancolonoc diverticulosis redundant colon  . Esophagogastroduodenoscopy N/A 12/17/2015    [id number] gastric polyp, biopsy benign  . Agile capsule N/A 02/22/2016    dummy capsule remained in the distal ileum    Prior to Admission medications   Medication Sig Start Date End Date Taking? Authorizing Provider  acetaminophen (TYLENOL) 500 MG tablet Take 1 tablet (500 mg total) by mouth every 6 (six) hours as needed for moderate pain. Pain 07/12/15  Yes Claretta Fraise, MD  aspirin EC 81 MG tablet Take 81 mg by mouth daily.   Yes Historical Provider, MD  budesonide (ENTOCORT EC) 3 MG 24 hr capsule Take 3 capsules (9 mg total) by mouth daily. 03/15/16  Yes Mahala Menghini, PA-C  cyclobenzaprine (FLEXERIL) 10 MG tablet Take 1 tablet (10 mg total) by mouth 3 (three) times daily as needed for muscle spasms. 03/07/16  Yes Claretta Fraise, MD  diclofenac (VOLTAREN) 75 MG EC tablet Take 1 tablet (75 mg total) by mouth 2 (two) times daily. 03/29/16  Yes Claretta Fraise, MD  DULoxetine (CYMBALTA) 60 MG capsule Take 1 capsule (60 mg total) by mouth daily. 02/28/16  Yes Claretta Fraise, MD  folic acid (FOLVITE) 1 MG tablet Take 1 tablet (1 mg total) by mouth daily. 02/14/16  Yes Claretta Fraise, MD  levothyroxine (SYNTHROID, LEVOTHROID) 125 MCG tablet Take 1 tablet (125 mcg total) by mouth daily. 03/16/16  Yes Chipper Herb, MD  thiamine (VITAMIN B-1) 100 MG tablet Take 1 tablet (100 mg total) by mouth daily. 04/20/16  Yes Claretta Fraise, MD  valsartan (DIOVAN) 160 MG tablet Take 2 tablets (320 mg total) by mouth daily. Patient taking differently: Take 160 mg by mouth daily.  02/14/16  Yes Claretta Fraise, MD    Allergies as of 04/28/2016 - Review Complete 04/28/2016  Allergen Reaction Noted  . Norvasc [amlodipine besylate] Hives 04/21/2011  . Procardia [nifedipine] Swelling 04/21/2011    Family History  Problem  Relation Age of Onset  . GER disease Mother   . Hypertension Maternal Grandmother     Social History   Social History  . Marital Status: Widowed    Spouse Name: N/A  . Number of Children: N/A  . Years of Education: N/A   Occupational History  . Not on file.   Social History Main Topics  . Smoking status: Former Smoker -- 2.00 packs/day for 30 years    Types: Cigarettes    Quit date: 06/17/1992  . Smokeless tobacco: Not on file  . Alcohol Use: 7.0 oz/week    14 drink(s) per week  . Drug Use: No  . Sexual Activity: Not on file   Other Topics Concern  . Not on file   Social History Narrative    Review of Systems: See HPI, otherwise negative ROS  Physical Exam: BP 85/59 mmHg  Pulse 112  Temp(Src) 98.2 F (36.8 C) (Oral)  Ht 5\' 9"  (1.753 m)  Wt 158 lb 12.8 oz (72.031 kg)  BMI 23.44 kg/m2  LMP 08/21/2013 General:   Alert,  Well-developed, well-nourished, pleasant and cooperative in NAD Neck:  Supple; no masses or thyromegaly. No significant cervical adenopathy. Lungs:  Clear throughout to auscultation.   No wheezes, crackles, or rhonchi. No acute distress. Heart:  Regular rate and rhythm; no murmurs, clicks, rubs,  or gallops. Abdomen: Non-distended, normal bowel sounds.  Soft and nontender without appreciable mass or hepatosplenomegaly.  Pulses:  Normal pulses noted. Extremities:  Without clubbing or edema.  Impression:  History profound anemia secondary to GI bleeding related primarily to extensive ulceration of the ileocecal valve.  In retrospect,almost certainly due to Poly-nonsteroidal anti-inflammatory drug use. Hemoglobin has improved significantly. Patient is without any GI symptoms at this time. Hyperplastic polyp removed from her stomach. No evidence of malignancy or H. Pylori. She's doing extremely well at this time and on no GI medications.  Clinically, as long as she is doing well, no further GI evaluation warned.  Recommendations: Continue to avoid NSAID  drugs  CBC today  Consider one more colonoscopy in 10 years for screening purpose  Further recommendations to follow     Notice: This dictation was prepared with Dragon dictation along with smaller phrase technology. Any transcriptional errors that result from this process are unintentional and may not be corrected upon review.

## 2016-04-28 NOTE — Patient Instructions (Signed)
Continue to avoid NSAID drugs  CBC   Consider one more colonoscopy in 10 years  Further recommendations to follow

## 2016-05-04 DIAGNOSIS — D649 Anemia, unspecified: Secondary | ICD-10-CM | POA: Diagnosis not present

## 2016-05-04 LAB — CBC WITH DIFFERENTIAL/PLATELET
Basophils Absolute: 87 cells/uL (ref 0–200)
Basophils Relative: 1 %
Eosinophils Absolute: 87 cells/uL (ref 15–500)
Eosinophils Relative: 1 %
HCT: 30.9 % — ABNORMAL LOW (ref 35.0–45.0)
Hemoglobin: 10.1 g/dL — ABNORMAL LOW (ref 11.7–15.5)
Lymphocytes Relative: 20 %
Lymphs Abs: 1740 cells/uL (ref 850–3900)
MCH: 27.4 pg (ref 27.0–33.0)
MCHC: 32.7 g/dL (ref 32.0–36.0)
MCV: 83.7 fL (ref 80.0–100.0)
MPV: 8.9 fL (ref 7.5–12.5)
Monocytes Absolute: 435 cells/uL (ref 200–950)
Monocytes Relative: 5 %
Neutro Abs: 6351 cells/uL (ref 1500–7800)
Neutrophils Relative %: 73 %
Platelets: 353 10*3/uL (ref 140–400)
RBC: 3.69 MIL/uL — ABNORMAL LOW (ref 3.80–5.10)
RDW: 15 % (ref 11.0–15.0)
WBC: 8.7 10*3/uL (ref 3.8–10.8)

## 2016-05-12 ENCOUNTER — Other Ambulatory Visit: Payer: Self-pay | Admitting: Family Medicine

## 2016-05-12 ENCOUNTER — Encounter: Payer: Self-pay | Admitting: Family Medicine

## 2016-05-12 ENCOUNTER — Other Ambulatory Visit: Payer: Self-pay | Admitting: Internal Medicine

## 2016-05-12 ENCOUNTER — Ambulatory Visit (INDEPENDENT_AMBULATORY_CARE_PROVIDER_SITE_OTHER): Payer: Medicare Other | Admitting: Family Medicine

## 2016-05-12 VITALS — BP 125/72 | HR 106 | Temp 98.0°F | Ht 69.0 in | Wt 162.4 lb

## 2016-05-12 DIAGNOSIS — K633 Ulcer of intestine: Secondary | ICD-10-CM | POA: Diagnosis not present

## 2016-05-12 DIAGNOSIS — D500 Iron deficiency anemia secondary to blood loss (chronic): Secondary | ICD-10-CM

## 2016-05-12 DIAGNOSIS — I10 Essential (primary) hypertension: Secondary | ICD-10-CM

## 2016-05-12 DIAGNOSIS — E039 Hypothyroidism, unspecified: Secondary | ICD-10-CM | POA: Diagnosis not present

## 2016-05-12 DIAGNOSIS — Z23 Encounter for immunization: Secondary | ICD-10-CM

## 2016-05-12 MED ORDER — FERROUS FUMARATE 324 (106 FE) MG PO TABS: 1.0000 | ORAL_TABLET | Freq: Two times a day (BID) | ORAL | Status: DC

## 2016-05-12 MED ORDER — CELECOXIB 200 MG PO CAPS: 200.0000 mg | ORAL_CAPSULE | Freq: Every day | ORAL | Status: DC

## 2016-05-12 NOTE — Progress Notes (Signed)
Subjective:  Patient ID: [name], female    DOB: 09-24-1946  Age: 70 y.o. MRN: [id number]  CC: Hypertension   HPI [name] presents for  follow-up of hypertension. Patient has no history of headache chest pain or shortness of breath or recent cough. Patient also denies symptoms of TIA such as numbness weakness lateralizing. Patient checks  blood pressure at home and has not had any elevated readings recently. Patient denies side effects from medication. States taking it regularly.   Burning vaginally and and intermittently.Has DC. 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 She has never had a hysterectomy, tubal, genital surgery.  Patient presents for follow-up on  thyroid. The patient has a history of hypothyroidism for many years. It has been stable recently. Pt. denies any change in  voice, loss of hair, heat or cold intolerance. Energy level has been adequate to good. Patient denies constipation and diarrhea. No myxedema. Medication is as noted below. Verified that pt is taking it daily on an empty stomach. Well tolerated.   History [name] has a past medical history of Hypertension; Menopause; Hypothyroid; Mini stroke ([place]); Hyperlipidemia; B12 deficiency; Shortness of breath; Headache(784.0); Anxiety; Pneumonia; Anemia; Depression; Stroke [facility]) (2010); Alcoholism ([place]) (01/09/2012); Hyponatremia (01/09/2012); Chronic kidney disease (CKD), stage II (mild); COPD (chronic obstructive pulmonary disease) ([place]); Orthostatic syncope (10/02/2012); and Carotid artery narrowing (10/03/2012).   She has past surgical history that includes Appendectomy; Tonsillectomy; Cataract extraction w/PHACO (06/20/2012); Cataract extraction w/PHACO (07/11/2012); Colonoscopy (N/A, 12/17/2015); Esophagogastroduodenoscopy (N/A, 12/17/2015); and Agile capsule (N/A, 02/22/2016).   Her family history includes GER disease in her mother; Hypertension in her maternal grandmother.She reports that she quit  smoking about 23 years ago. Her smoking use included Cigarettes. She has a 60 pack-year smoking history. She does not have any smokeless tobacco history on file. She reports that she drinks about 7.0 oz of alcohol per week. She reports that she does not use illicit drugs.  Current Outpatient Prescriptions on File Prior to Visit  Medication Sig Dispense Refill  . acetaminophen (TYLENOL) 500 MG tablet Take 1 tablet (500 mg total) by mouth every 6 (six) hours as needed for moderate pain. Pain 90 tablet 4  . aspirin EC 81 MG tablet Take 81 mg by mouth daily.    . budesonide (ENTOCORT EC) 3 MG 24 hr capsule Take 3 capsules (9 mg total) by mouth daily. 90 capsule 1  . cyclobenzaprine (FLEXERIL) 10 MG tablet Take 1 tablet (10 mg total) by mouth 3 (three) times daily as needed for muscle spasms. 270 tablet 1  . diclofenac (VOLTAREN) 75 MG EC tablet Take 1 tablet (75 mg total) by mouth 2 (two) times daily. 60 tablet 1  . folic acid (FOLVITE) 1 MG tablet Take 1 tablet (1 mg total) by mouth daily. 90 tablet 1  . levothyroxine (SYNTHROID, LEVOTHROID) 125 MCG tablet Take 1 tablet (125 mcg total) by mouth daily. 90 tablet 1  . thiamine (VITAMIN B-1) 100 MG tablet Take 1 tablet (100 mg total) by mouth daily. 90 tablet 3  . valsartan (DIOVAN) 160 MG tablet Take 2 tablets (320 mg total) by mouth daily. (Patient taking differently: Take 160 mg by mouth daily. ) 180 tablet 1   No current facility-administered medications on file prior to visit.    ROS Review of Systems  Constitutional: Negative for fever, activity change and appetite change.  HENT: Negative for congestion, rhinorrhea and sore throat.   Eyes: Negative for visual disturbance.  Respiratory:  Negative for cough and shortness of breath.   Cardiovascular: Negative for chest pain and palpitations.  Gastrointestinal: Negative for nausea, abdominal pain and diarrhea.  Genitourinary: Positive for vaginal discharge, vaginal pain and pelvic pain. Negative  for dysuria.  Musculoskeletal: Negative for myalgias and arthralgias.    Objective:  BP 125/72 mmHg  Pulse 106  Temp(Src) 98 F (36.7 C) (Oral)  Ht '5\' 9"'$  (1.753 m)  Wt 162 lb 6.4 oz (73.664 kg)  BMI 23.97 kg/m2  SpO2 99%  LMP 08/21/2013  BP Readings from Last 3 Encounters:  05/12/16 125/72  04/28/16 85/59  03/10/16 106/64    Wt Readings from Last 3 Encounters:  05/12/16 162 lb 6.4 oz (73.664 kg)  04/28/16 158 lb 12.8 oz (72.031 kg)  03/10/16 157 lb 3.2 oz (71.305 kg)     Physical Exam  Constitutional: She is oriented to person, place, and time. She appears well-developed and well-nourished. No distress.  HENT:  Head: Normocephalic and atraumatic.  Right Ear: External ear normal.  Left Ear: External ear normal.  Nose: Nose normal.  Mouth/Throat: Oropharynx is clear and moist.  Eyes: Conjunctivae and EOM are normal. Pupils are equal, round, and reactive to light.  Neck: Normal range of motion. Neck supple. No thyromegaly present.  Cardiovascular: Normal rate, regular rhythm and normal heart sounds.   No murmur heard. Pulmonary/Chest: Effort normal and breath sounds normal. No respiratory distress. She has no wheezes. She has no rales.  Abdominal: Soft. Bowel sounds are normal. She exhibits no distension. There is no tenderness.  Lymphadenopathy:    She has no cervical adenopathy.  Neurological: She is alert and oriented to person, place, and time. She has normal reflexes.  Skin: Skin is warm and dry.  Psychiatric: She has a normal mood and affect. Her behavior is normal. Judgment and thought content normal.     Lab Results  Component Value Date   WBC 8.7 05/04/2016   HGB 10.1* 05/04/2016   HCT 30.9* 05/04/2016   PLT 353 05/04/2016   GLUCOSE 82 02/07/2016   CHOL 210* 08/05/2015   TRIG 81 08/05/2015   HDL 48 08/05/2015   LDLCALC 146* 08/05/2015   ALT 8 03/10/2016   AST 8 03/10/2016   NA 139 02/07/2016   K 5.6* 02/07/2016   CL 101 02/07/2016   CREATININE  1.65* 02/07/2016   BUN 32* 02/07/2016   CO2 20 02/07/2016   TSH 9.200* 12/14/2015   INR 1.14 12/16/2015   HGBA1C 4.7 11/13/2013    Dg Abd 1 View  02/24/2016  CLINICAL DATA:  Evaluate Agile capsule EXAM: ABDOMEN - 1 VIEW COMPARISON:  KUB of 02/23/2016 FINDINGS: The agile capsule overlies the right lower quadrant probably within distal ileum. No bowel obstruction is seen. There is a moderate amount of feces throughout the entire colon. The bones are osteopenic. IMPRESSION: Agile capsule is within the right lower quadrant probably in the region of the distal or terminal ileum. Electronically Signed   By: Ivar Drape M.D.   On: 02/24/2016 14:42    Assessment & Plan:   [name] was seen today for hypertension.  Diagnoses and all orders for this visit:  Essential hypertension -     Cancel: TSH + free T4 -     CMP14+EGFR  Iron deficiency anemia due to chronic blood loss -     Cancel: TSH + free T4 -     CMP14+EGFR  Hypothyroidism, unspecified hypothyroidism type -     Cancel: TSH + free T4 -  CMP14+EGFR -     TSH + free T4  Colonic ulcer -     CMP14+EGFR  Other orders -     Pneumococcal conjugate vaccine 13-valent -     Ferrous Fumarate (HEMOCYTE - 106 MG FE) 324 (106 Fe) MG TABS tablet; Take 1 tablet (106 mg of iron total) by mouth 2 (two) times daily. -     celecoxib (CELEBREX) 200 MG capsule; Take 1 capsule (200 mg total) by mouth daily. With food  I am having [name] start on Ferrous Fumarate and celecoxib. I am also having her maintain her acetaminophen, valsartan, folic acid, cyclobenzaprine, aspirin EC, budesonide, levothyroxine, diclofenac, thiamine, and DULoxetine.  Meds ordered this encounter  Medications  . Ferrous Fumarate (HEMOCYTE - 106 MG FE) 324 (106 Fe) MG TABS tablet    Sig: Take 1 tablet (106 mg of iron total) by mouth 2 (two) times daily.    Dispense:  60 tablet    Refill:  5  . celecoxib (CELEBREX) 200 MG capsule    Sig: Take 1 capsule (200 mg total)  by mouth daily. With food    Dispense:  90 capsule    Refill:  1  Notify your gynecologist of the ongoing burning and discharge. If they cannot evaluate you promptly contact this office for evaluation Follow-up: Return in about 3 months (around 08/12/2016).  Claretta Fraise, M.D.

## 2016-05-15 ENCOUNTER — Other Ambulatory Visit: Payer: Self-pay | Admitting: Family Medicine

## 2016-05-15 MED ORDER — LEVOTHYROXINE SODIUM 112 MCG PO TABS: 112.0000 ug | ORAL_TABLET | Freq: Every day | ORAL | Status: DC

## 2016-05-18 DIAGNOSIS — R898 Other abnormal findings in specimens from other organs, systems and tissues: Secondary | ICD-10-CM | POA: Diagnosis not present

## 2016-05-18 DIAGNOSIS — Z6824 Body mass index (BMI) 24.0-24.9, adult: Secondary | ICD-10-CM | POA: Diagnosis not present

## 2016-05-18 DIAGNOSIS — Z01419 Encounter for gynecological examination (general) (routine) without abnormal findings: Secondary | ICD-10-CM | POA: Diagnosis not present

## 2016-05-18 DIAGNOSIS — Z779 Other contact with and (suspected) exposures hazardous to health: Secondary | ICD-10-CM | POA: Diagnosis not present

## 2016-05-19 ENCOUNTER — Telehealth: Payer: Self-pay | Admitting: Family Medicine

## 2016-05-30 ENCOUNTER — Telehealth: Payer: Self-pay | Admitting: Family Medicine

## 2016-05-30 NOTE — Telephone Encounter (Signed)
Pt received meloxicam (mobic) from [facility]. I do not see this in her chart. Informed pt to contact [facility] that maybe they substituted her celecoxib (celebrex) that was refilled on 05/12/16

## 2016-06-19 ENCOUNTER — Other Ambulatory Visit: Payer: Self-pay

## 2016-06-19 DIAGNOSIS — D500 Iron deficiency anemia secondary to blood loss (chronic): Secondary | ICD-10-CM

## 2016-06-21 LAB — CMP14+EGFR
ALT: 6 IU/L (ref 0–32)
AST: 6 IU/L (ref 0–40)
Albumin/Globulin Ratio: 1.7 (ref 1.2–2.2)
Albumin: 4 g/dL (ref 3.6–4.8)
Alkaline Phosphatase: 113 IU/L (ref 39–117)
BUN/Creatinine Ratio: 21 (ref 12–28)
BUN: 30 mg/dL — ABNORMAL HIGH (ref 8–27)
Bilirubin Total: 0.3 mg/dL (ref 0.0–1.2)
CO2: 22 mmol/L (ref 18–29)
Calcium: 8.7 mg/dL (ref 8.7–10.3)
Chloride: 101 mmol/L (ref 96–106)
Creatinine, Ser: 1.4 mg/dL — ABNORMAL HIGH (ref 0.57–1.00)
GFR calc Af Amer: 45 mL/min/{1.73_m2} — ABNORMAL LOW (ref >59)
GFR calc non Af Amer: 39 mL/min/{1.73_m2} — ABNORMAL LOW (ref >59)
Globulin, Total: 2.4 g/dL (ref 1.5–4.5)
Glucose: 68 mg/dL (ref 65–99)
Potassium: 5.2 mmol/L (ref 3.5–5.2)
Sodium: 138 mmol/L (ref 134–144)
Total Protein: 6.4 g/dL (ref 6.0–8.5)

## 2016-06-21 LAB — TSH+FREE T4
Free T4: 1.98 ng/dL — ABNORMAL HIGH (ref 0.82–1.77)
TSH: 0.068 u[IU]/mL — ABNORMAL LOW (ref 0.450–4.500)

## 2016-06-23 ENCOUNTER — Encounter: Payer: Self-pay | Admitting: Nurse Practitioner

## 2016-06-23 ENCOUNTER — Ambulatory Visit (INDEPENDENT_AMBULATORY_CARE_PROVIDER_SITE_OTHER): Payer: Medicare Other

## 2016-06-23 ENCOUNTER — Ambulatory Visit (INDEPENDENT_AMBULATORY_CARE_PROVIDER_SITE_OTHER): Payer: Medicare Other | Admitting: Nurse Practitioner

## 2016-06-23 VITALS — BP 111/77 | HR 115 | Temp 97.0°F | Ht 69.0 in | Wt 163.0 lb

## 2016-06-23 DIAGNOSIS — R1032 Left lower quadrant pain: Secondary | ICD-10-CM | POA: Diagnosis not present

## 2016-06-23 DIAGNOSIS — K5732 Diverticulitis of large intestine without perforation or abscess without bleeding: Secondary | ICD-10-CM | POA: Diagnosis not present

## 2016-06-23 MED ORDER — METRONIDAZOLE 500 MG PO TABS: 500.0000 mg | ORAL_TABLET | Freq: Two times a day (BID) | ORAL | Status: DC

## 2016-06-23 MED ORDER — CIPROFLOXACIN HCL 500 MG PO TABS: 500.0000 mg | ORAL_TABLET | Freq: Two times a day (BID) | ORAL | Status: DC

## 2016-06-23 NOTE — Patient Instructions (Signed)
Diverticulitis °Diverticulitis is inflammation or infection of small pouches in your colon that form when you have a condition called diverticulosis. The pouches in your colon are called diverticula. Your colon, or large intestine, is where water is absorbed and stool is formed. °Complications of diverticulitis can include: °· Bleeding. °· Severe infection. °· Severe pain. °· Perforation of your colon. °· Obstruction of your colon. °CAUSES  °Diverticulitis is caused by bacteria. °Diverticulitis happens when stool becomes trapped in diverticula. This allows bacteria to grow in the diverticula, which can lead to inflammation and infection. °RISK FACTORS °People with diverticulosis are at risk for diverticulitis. Eating a diet that does not include enough fiber from fruits and vegetables may make diverticulitis more likely to develop. °SYMPTOMS  °Symptoms of diverticulitis may include: °· Abdominal pain and tenderness. The pain is normally located on the left side of the abdomen, but may occur in other areas. °· Fever and chills. °· Bloating. °· Cramping. °· Nausea. °· Vomiting. °· Constipation. °· Diarrhea. °· Blood in your stool. °DIAGNOSIS  °Your health care provider will ask you about your medical history and do a physical exam. You may need to have tests done because many medical conditions can cause the same symptoms as diverticulitis. Tests may include: °· Blood tests. °· Urine tests. °· Imaging tests of the abdomen, including X-rays and CT scans. °When your condition is under control, your health care provider may recommend that you have a colonoscopy. A colonoscopy can show how severe your diverticula are and whether something else is causing your symptoms. °TREATMENT  °Most cases of diverticulitis are mild and can be treated at home. Treatment may include: °· Taking over-the-counter pain medicines. °· Following a clear liquid diet. °· Taking antibiotic medicines by mouth for 7-10 days. °More severe cases may  be treated at a hospital. Treatment may include: °· Not eating or drinking. °· Taking prescription pain medicine. °· Receiving antibiotic medicines through an IV tube. °· Receiving fluids and nutrition through an IV tube. °· Surgery. °HOME CARE INSTRUCTIONS  °· Follow your health care provider's instructions carefully. °· Follow a full liquid diet or other diet as directed by your health care provider. After your symptoms improve, your health care provider may tell you to change your diet. He or she may recommend you eat a high-fiber diet. Fruits and vegetables are good sources of fiber. Fiber makes it easier to pass stool. °· Take fiber supplements or probiotics as directed by your health care provider. °· Only take medicines as directed by your health care provider. °· Keep all your follow-up appointments. °SEEK MEDICAL CARE IF:  °· Your pain does not improve. °· You have a hard time eating food. °· Your bowel movements do not return to normal. °SEEK IMMEDIATE MEDICAL CARE IF:  °· Your pain becomes worse. °· Your symptoms do not get better. °· Your symptoms suddenly get worse. °· You have a fever. °· You have repeated vomiting. °· You have bloody or black, tarry stools. °MAKE SURE YOU:  °· Understand these instructions. °· Will watch your condition. °· Will get help right away if you are not doing well or get worse. °  °This information is not intended to replace advice given to you by your health care provider. Make sure you discuss any questions you have with your health care provider. °  °Document Released: 09/06/2005 Document Revised: 12/02/2013 Document Reviewed: 10/22/2013 °Elsevier Interactive Patient Education ©2016 [employer] ° °

## 2016-06-23 NOTE — Progress Notes (Signed)
   Subjective:    Patient ID: [name], female    DOB: [birth year], 70 y.o.   MRN: [id number]  HPI Patient comes in c/o abdominal pain- Yesterday she was cooking and started having periumbilical and left lower quadrant pain. Intermittent every 5-10 minutes- kept her up all night last night. Rates  Pain 9/10- makes her want to curl up in a ball. Comes and goes on its own. Last bowel movement was 1 hour ago which did not help.    Review of Systems  Constitutional: Negative for fever and chills.  Respiratory: Negative.   Gastrointestinal: Positive for abdominal pain. Negative for nausea, vomiting and constipation.  Genitourinary: Negative.   Neurological: Negative.   Psychiatric/Behavioral: Negative.   All other systems reviewed and are negative.      Objective:   Physical Exam  Constitutional: She appears well-developed and well-nourished.  Cardiovascular: Normal rate, regular rhythm and normal heart sounds.   Pulmonary/Chest: Effort normal and breath sounds normal.  Abdominal: Soft. There is tenderness (left lower quadrant pain on palpation).  Skin: Skin is warm.  Psychiatric: She has a normal mood and affect. Her behavior is normal. Judgment and thought content normal.   BP 111/77 mmHg  Pulse 115  Temp(Src) 97 F (36.1 C) (Oral)  Ht 5\' 9"  (1.753 m)  Wt 163 lb (73.936 kg)  BMI 24.06 kg/m2  LMP 08/21/2013        Assessment & Plan:  1. Abdominal pain, left lower quadrant - DG Abd 1 View; Future  2. Diverticulitis of colon Avoid foods with seeds or things with undigestibles skin Folow up if no improvement - ciprofloxacin (CIPRO) 500 MG tablet; Take 1 tablet (500 mg total) by mouth 2 (two) times daily.  Dispense: 20 tablet; Refill: 0 - metroNIDAZOLE (FLAGYL) 500 MG tablet; Take 1 tablet (500 mg total) by mouth 2 (two) times daily.  Dispense: 14 tablet; Refill: 0  Mary-Margaret Hassell Done, FNP

## 2016-06-28 ENCOUNTER — Emergency Department (HOSPITAL_COMMUNITY): Payer: Medicare Other

## 2016-06-28 ENCOUNTER — Telehealth: Payer: Self-pay

## 2016-06-28 ENCOUNTER — Encounter (HOSPITAL_COMMUNITY): Payer: Self-pay | Admitting: Emergency Medicine

## 2016-06-28 ENCOUNTER — Inpatient Hospital Stay (HOSPITAL_COMMUNITY)
Admission: EM | Admit: 2016-06-28 | Discharge: 2016-07-02 | DRG: 388 | Disposition: A | Discharge status: Home / Self Care | Payer: Medicare Other | Attending: Internal Medicine | Admitting: Internal Medicine

## 2016-06-28 DIAGNOSIS — K56609 Unspecified intestinal obstruction, unspecified as to partial versus complete obstruction: Secondary | ICD-10-CM | POA: Diagnosis present

## 2016-06-28 DIAGNOSIS — N182 Chronic kidney disease, stage 2 (mild): Secondary | ICD-10-CM | POA: Diagnosis not present

## 2016-06-28 DIAGNOSIS — N179 Acute kidney failure, unspecified: Secondary | ICD-10-CM | POA: Diagnosis present

## 2016-06-28 DIAGNOSIS — J449 Chronic obstructive pulmonary disease, unspecified: Secondary | ICD-10-CM | POA: Diagnosis not present

## 2016-06-28 DIAGNOSIS — R109 Unspecified abdominal pain: Secondary | ICD-10-CM | POA: Diagnosis not present

## 2016-06-28 DIAGNOSIS — I10 Essential (primary) hypertension: Secondary | ICD-10-CM

## 2016-06-28 DIAGNOSIS — F05 Delirium due to known physiological condition: Secondary | ICD-10-CM | POA: Diagnosis not present

## 2016-06-28 DIAGNOSIS — N183 Chronic kidney disease, stage 3 (moderate): Secondary | ICD-10-CM | POA: Diagnosis present

## 2016-06-28 DIAGNOSIS — R739 Hyperglycemia, unspecified: Secondary | ICD-10-CM | POA: Diagnosis present

## 2016-06-28 DIAGNOSIS — Z8249 Family history of ischemic heart disease and other diseases of the circulatory system: Secondary | ICD-10-CM | POA: Diagnosis not present

## 2016-06-28 DIAGNOSIS — R000 Tachycardia, unspecified: Secondary | ICD-10-CM | POA: Diagnosis present

## 2016-06-28 DIAGNOSIS — K529 Noninfective gastroenteritis and colitis, unspecified: Secondary | ICD-10-CM | POA: Diagnosis not present

## 2016-06-28 DIAGNOSIS — K565 Intestinal adhesions [bands] with obstruction (postprocedural) (postinfection): Secondary | ICD-10-CM | POA: Diagnosis not present

## 2016-06-28 DIAGNOSIS — Z7982 Long term (current) use of aspirin: Secondary | ICD-10-CM

## 2016-06-28 DIAGNOSIS — Z8673 Personal history of transient ischemic attack (TIA), and cerebral infarction without residual deficits: Secondary | ICD-10-CM

## 2016-06-28 DIAGNOSIS — D500 Iron deficiency anemia secondary to blood loss (chronic): Secondary | ICD-10-CM | POA: Diagnosis not present

## 2016-06-28 DIAGNOSIS — G9340 Encephalopathy, unspecified: Secondary | ICD-10-CM | POA: Diagnosis not present

## 2016-06-28 DIAGNOSIS — K5669 Other intestinal obstruction: Secondary | ICD-10-CM | POA: Diagnosis not present

## 2016-06-28 DIAGNOSIS — F419 Anxiety disorder, unspecified: Secondary | ICD-10-CM | POA: Diagnosis present

## 2016-06-28 DIAGNOSIS — F10239 Alcohol dependence with withdrawal, unspecified: Secondary | ICD-10-CM | POA: Diagnosis present

## 2016-06-28 DIAGNOSIS — Z87891 Personal history of nicotine dependence: Secondary | ICD-10-CM | POA: Diagnosis not present

## 2016-06-28 DIAGNOSIS — E538 Deficiency of other specified B group vitamins: Secondary | ICD-10-CM | POA: Diagnosis present

## 2016-06-28 DIAGNOSIS — E039 Hypothyroidism, unspecified: Secondary | ICD-10-CM | POA: Diagnosis not present

## 2016-06-28 DIAGNOSIS — K219 Gastro-esophageal reflux disease without esophagitis: Secondary | ICD-10-CM | POA: Diagnosis not present

## 2016-06-28 DIAGNOSIS — I129 Hypertensive chronic kidney disease with stage 1 through stage 4 chronic kidney disease, or unspecified chronic kidney disease: Secondary | ICD-10-CM | POA: Diagnosis not present

## 2016-06-28 DIAGNOSIS — E785 Hyperlipidemia, unspecified: Secondary | ICD-10-CM | POA: Diagnosis present

## 2016-06-28 DIAGNOSIS — R112 Nausea with vomiting, unspecified: Secondary | ICD-10-CM | POA: Diagnosis not present

## 2016-06-28 DIAGNOSIS — K5660 Unspecified intestinal obstruction: Secondary | ICD-10-CM | POA: Diagnosis not present

## 2016-06-28 DIAGNOSIS — R1110 Vomiting, unspecified: Secondary | ICD-10-CM | POA: Diagnosis not present

## 2016-06-28 DIAGNOSIS — E038 Other specified hypothyroidism: Secondary | ICD-10-CM | POA: Diagnosis not present

## 2016-06-28 HISTORY — DX: Unspecified intestinal obstruction, unspecified as to partial versus complete obstruction: K56.609

## 2016-06-28 LAB — CBC
HCT: 37.3 % (ref 36.0–46.0)
Hemoglobin: 12.3 g/dL (ref 12.0–15.0)
MCH: 27.7 pg (ref 26.0–34.0)
MCHC: 33 g/dL (ref 30.0–36.0)
MCV: 84 fL (ref 78.0–100.0)
Platelets: 378 10*3/uL (ref 150–400)
RBC: 4.44 MIL/uL (ref 3.87–5.11)
RDW: 13.9 % (ref 11.5–15.5)
WBC: 18 10*3/uL — ABNORMAL HIGH (ref 4.0–10.5)

## 2016-06-28 LAB — URINALYSIS, ROUTINE W REFLEX MICROSCOPIC
Glucose, UA: NEGATIVE mg/dL
Hgb urine dipstick: NEGATIVE
Leukocytes, UA: NEGATIVE
Nitrite: NEGATIVE
Protein, ur: 30 mg/dL — AB
Specific Gravity, Urine: >1.03 — ABNORMAL HIGH (ref 1.005–1.030)
pH: 5 (ref 5.0–8.0)

## 2016-06-28 LAB — COMPREHENSIVE METABOLIC PANEL
ALT: 11 U/L — ABNORMAL LOW (ref 14–54)
AST: 17 U/L (ref 15–41)
Albumin: 4.7 g/dL (ref 3.5–5.0)
Alkaline Phosphatase: 102 U/L (ref 38–126)
Anion gap: 12 (ref 5–15)
BUN: 30 mg/dL — ABNORMAL HIGH (ref 6–20)
CO2: 21 mmol/L — ABNORMAL LOW (ref 22–32)
Calcium: 9.3 mg/dL (ref 8.9–10.3)
Chloride: 101 mmol/L (ref 101–111)
Creatinine, Ser: 1.84 mg/dL — ABNORMAL HIGH (ref 0.44–1.00)
GFR calc Af Amer: 31 mL/min — ABNORMAL LOW (ref >60)
GFR calc non Af Amer: 27 mL/min — ABNORMAL LOW (ref >60)
Glucose, Bld: 175 mg/dL — ABNORMAL HIGH (ref 65–99)
Potassium: 4.8 mmol/L (ref 3.5–5.1)
Sodium: 134 mmol/L — ABNORMAL LOW (ref 135–145)
Total Bilirubin: 0.8 mg/dL (ref 0.3–1.2)
Total Protein: 8.2 g/dL — ABNORMAL HIGH (ref 6.5–8.1)

## 2016-06-28 LAB — URINE MICROSCOPIC-ADD ON: RBC / HPF: NONE SEEN RBC/hpf (ref 0–5)

## 2016-06-28 LAB — LIPASE, BLOOD: Lipase: 17 U/L (ref 11–51)

## 2016-06-28 LAB — LACTIC ACID, PLASMA
Lactic Acid, Venous: 2.2 mmol/L (ref 0.5–1.9)
Lactic Acid, Venous: 1.5 mmol/L (ref 0.5–1.9)

## 2016-06-28 MED ORDER — SODIUM CHLORIDE 0.9 % IV BOLUS (SEPSIS)
500.0000 mL | Freq: Once | INTRAVENOUS | Status: AC
Start: 2016-06-28 — End: 2016-06-28
  Administered 2016-06-28: 500 mL via INTRAVENOUS

## 2016-06-28 MED ORDER — ONDANSETRON HCL 4 MG/2ML IJ SOLN
4.0000 mg | INTRAMUSCULAR | Status: DC | PRN
  Administered 2016-06-28: 4 mg via INTRAVENOUS
  Filled 2016-06-28: qty 2

## 2016-06-28 MED ORDER — DIATRIZOATE MEGLUMINE & SODIUM 66-10 % PO SOLN
ORAL | Status: AC
  Filled 2016-06-28: qty 30

## 2016-06-28 MED ORDER — SODIUM CHLORIDE 0.9 % IV BOLUS (SEPSIS)
500.0000 mL | Freq: Once | INTRAVENOUS | Status: AC
  Administered 2016-06-28: 500 mL via INTRAVENOUS

## 2016-06-28 MED ORDER — MORPHINE SULFATE (PF) 4 MG/ML IV SOLN
4.0000 mg | INTRAVENOUS | Status: DC | PRN
  Administered 2016-06-28: 4 mg via INTRAVENOUS
  Filled 2016-06-28: qty 1

## 2016-06-28 MED ORDER — SODIUM CHLORIDE 0.9 % IV SOLN
INTRAVENOUS | Status: DC
  Administered 2016-06-28: 20:00:00 via INTRAVENOUS

## 2016-06-28 NOTE — ED Notes (Signed)
CRITICAL VALUE ALERT  Critical value received:  Lactic acid  Date of notification:  06/28/16  Time of notification:  1953  Critical value read back:Yes.    Nurse who received alert:  Rosealee Albee, RN  MD notified (1st page):  Dr. Thurnell Garbe  Time of first page:  1955  MD notified (2nd page):  Time of second page:  Responding MD:  Dr. Thurnell Garbe  Time MD responded:  [phone]

## 2016-06-28 NOTE — Telephone Encounter (Signed)
Patient called and is having abdominal pain, constipation and vomiting.  She states she was seen last week and diagnosed with diverticulitis and is taking her medication as prescribed.  She wold like to be seen today.  Patient was informed that we do not have any appointments left this afternoon and that we recommend she go on to the ER to be evaluated, may possibly need imaging that is not available here.  Patient agrees and will proceed to ER.

## 2016-06-28 NOTE — ED Notes (Signed)
Patient states she was seen by PCP on Friday and given medications for diverticulitis. States her pain is worse and has been vomiting since last night.

## 2016-06-28 NOTE — H&P (Addendum)
History and Physical    [name] [id number] DOB: [birth year] DOA: 06/28/2016  Referring MD/NP/PA: Francine Graven, DO PCP: Claretta Fraise, MD) Outpatient Specialists:   Gastroenterology: Daneil Dolin, MD Patient coming from: Home  Chief Complaint: Abdominal Pain and Emesis  HPI: [name] is a 70 y.o. female with medical history significant of HTN, Hypothyroidism, Hyperlipidemia, Anxiety, COPD, and CKD. Presents with complaints of left sided abd pain that onset 5 days ago that has progressively worsened with associated Nausea and Vomiting. She reports seeing her PCP who prescribed her Cipro and Flagyl. There has been no decrease of pain since the start of the abx. Denies any further complaints. Pt reports having colonoscopy in January. Per colonoscopy, she has circumferential narrowing of the small bowel suspicious for crohn's disease. She also has hx of B12 deficiency.   ED Course: CT scan revealed SBO to the level of the terminal ileum where there is matting of the small bowel loops, suggesting adhesion formation.   Review of Systems: As per HPI otherwise 10 point review of systems negative.   Past Medical History  Diagnosis Date  . Hypertension   . Menopause   . Hypothyroid   . Mini stroke ([place])   . Hyperlipidemia   . B12 deficiency   . Shortness of breath   . Headache(784.0)   . Anxiety   . Pneumonia   . Anemia   . Depression   . Stroke [facility]) 2010    no deficits  . Alcoholism ([place]) 01/09/2012  . Hyponatremia 01/09/2012  . COPD (chronic obstructive pulmonary disease) ([place])   . Orthostatic syncope 10/02/2012  . Carotid artery narrowing 10/03/2012    On the right.  . Chronic kidney disease (CKD), stage II (mild)     Past Surgical History  Procedure Laterality Date  . Appendectomy    . Tonsillectomy    . Cataract extraction w/phaco  06/20/2012    Procedure: CATARACT EXTRACTION PHACO AND INTRAOCULAR LENS PLACEMENT (IOC);  Surgeon: Tonny Branch, MD;   Location: [facility];  Service: Ophthalmology;  Laterality: Right;  CDE:10.66  . Cataract extraction w/phaco  07/11/2012    Procedure: CATARACT EXTRACTION PHACO AND INTRAOCULAR LENS PLACEMENT (IOC);  Surgeon: Tonny Branch, MD;  Location: [facility];  Service: Ophthalmology;  Laterality: Left;  CDE: 12.69  . Colonoscopy N/A 12/17/2015    RMR: Marked circumferential ulceration of the ascending colon/Ileocecal valve ulceration with biopsy most consistent with NSAID injury versus inflammatory bowel disease, pancolonoc diverticulosis redundant colon  . Esophagogastroduodenoscopy N/A 12/17/2015    [id number] gastric polyp, biopsy benign  . Agile capsule N/A 02/22/2016    dummy capsule remained in the distal ileum     reports that she quit smoking about 24 years ago. Her smoking use included Cigarettes. She has a 60 pack-year smoking history. She does not have any smokeless tobacco history on file. She reports that she does not drink alcohol or use illicit drugs.  Allergies  Allergen Reactions  . Norvasc [Amlodipine Besylate] Hives  . Procardia [Nifedipine] Swelling    Edema      Family History  Problem Relation Age of Onset  . GER disease Mother   . Hypertension Maternal Grandmother     Prior to Admission medications   Medication Sig Start Date End Date Taking? Authorizing Provider  acetaminophen (TYLENOL) 500 MG tablet Take 1 tablet (500 mg total) by mouth every 6 (six) hours as needed for moderate pain. Pain 07/12/15   Claretta Fraise, MD  aspirin EC 81  MG tablet Take 81 mg by mouth daily.    Historical Provider, MD  budesonide (ENTOCORT EC) 3 MG 24 hr capsule Take 3 capsules (9 mg total) by mouth daily. 03/15/16   Mahala Menghini, PA-C  celecoxib (CELEBREX) 200 MG capsule Take 1 capsule (200 mg total) by mouth daily. With food 05/12/16   Claretta Fraise, MD  ciprofloxacin (CIPRO) 500 MG tablet Take 1 tablet (500 mg total) by mouth 2 (two) times daily. 06/23/16   Mary-Margaret Hassell Done, FNP  cyclobenzaprine  (FLEXERIL) 10 MG tablet Take 1 tablet (10 mg total) by mouth 3 (three) times daily as needed for muscle spasms. 03/07/16   Claretta Fraise, MD  diclofenac (VOLTAREN) 75 MG EC tablet Take 1 tablet (75 mg total) by mouth 2 (two) times daily. 03/29/16   Claretta Fraise, MD  DULoxetine (CYMBALTA) 60 MG capsule TAKE 1 CAPSULE DAILY 05/12/16   Claretta Fraise, MD  Ferrous Fumarate (HEMOCYTE - 106 MG FE) 324 (106 Fe) MG TABS tablet Take 1 tablet (106 mg of iron total) by mouth 2 (two) times daily. 05/12/16   Claretta Fraise, MD  folic acid (FOLVITE) 1 MG tablet Take 1 tablet (1 mg total) by mouth daily. 02/14/16   Claretta Fraise, MD  levothyroxine (SYNTHROID, LEVOTHROID) 112 MCG tablet Take 1 tablet (112 mcg total) by mouth daily. 05/15/16   Claretta Fraise, MD  metroNIDAZOLE (FLAGYL) 500 MG tablet Take 1 tablet (500 mg total) by mouth 2 (two) times daily. 06/23/16   Mary-Margaret Hassell Done, FNP  thiamine (VITAMIN B-1) 100 MG tablet Take 1 tablet (100 mg total) by mouth daily. 04/20/16   Claretta Fraise, MD  valsartan (DIOVAN) 160 MG tablet Take 2 tablets (320 mg total) by mouth daily. Patient taking differently: Take 160 mg by mouth daily.  02/14/16   Claretta Fraise, MD    Physical Exam: Filed Vitals:   06/28/16 1648 06/28/16 2033  BP: 116/74 144/71  Pulse: 126 80  Temp: 98.3 F (36.8 C)   TempSrc: Oral   Resp: 20 18  Height: 5\' 9"  (1.753 m)   Weight: 72.576 kg (160 lb)   SpO2: 97% 94%      Constitutional: NAD, calm, comfortable Filed Vitals:   06/28/16 1648 06/28/16 2033  BP: 116/74 144/71  Pulse: 126 80  Temp: 98.3 F (36.8 C)   TempSrc: Oral   Resp: 20 18  Height: 5\' 9"  (1.753 m)   Weight: 72.576 kg (160 lb)   SpO2: 97% 94%   Eyes: PERRL, lids and conjunctivae normal ENMT: Mucous membranes are moist. Posterior pharynx clear of any exudate or lesions.Normal dentition.  Neck: normal, supple, no masses, no thyromegaly Respiratory: clear to auscultation bilaterally, no wheezing, no crackles. Normal respiratory  effort. No accessory muscle use.  Cardiovascular: Regular rate and rhythm, no murmurs / rubs / gallops. No extremity edema. 2+ pedal pulses. No carotid bruits.  Abdomen: Soft, Mild distension, tenderness, no masses palpated. No hepatosplenomegaly. Bowel sounds positive.  Musculoskeletal: no clubbing / cyanosis. No joint deformity upper and lower extremities. no contractures. Normal muscle tone.  Skin: no rashes, lesions, ulcers. No induration Neurologic: CN 2-12 grossly intact. Sensation intact, DTR normal. Psychiatric: Normal judgment and insight. Alert and oriented x 3. Normal mood.   Labs on Admission: I have personally reviewed following labs and imaging studies  CBC:  Recent Labs Lab 06/28/16 1704  WBC 18.0*  HGB 12.3  HCT 37.3  MCV 84.0  PLT [id number]   Basic Metabolic Panel:  Recent Labs Lab 06/28/16  1704  NA 134*  K 4.8  CL 101  CO2 21*  GLUCOSE 175*  BUN 30*  CREATININE 1.84*  CALCIUM 9.3   GFR: Estimated Creatinine Clearance: 30.6 mL/min (by C-G formula based on Cr of 1.84). Liver Function Tests:  Recent Labs Lab 06/28/16 1704  AST 17  ALT 11*  ALKPHOS 102  BILITOT 0.8  PROT 8.2*  ALBUMIN 4.7    Recent Labs Lab 06/28/16 1704  LIPASE 17   Urine analysis:    Component Value Date/Time   COLORURINE ORANGE* 04/30/2015 0346   APPEARANCEUR CLEAR 04/30/2015 0346   LABSPEC 1.015 04/30/2015 0346   PHURINE 7.0 04/30/2015 0346   GLUCOSEU NEGATIVE 04/30/2015 0346   HGBUR LARGE* 04/30/2015 0346   BILIRUBINUR neg 09/10/2015 1624   BILIRUBINUR NEGATIVE 04/30/2015 [place] 04/30/2015 0346   PROTEINUR neg 09/10/2015 1624   PROTEINUR NEGATIVE 04/30/2015 0346   UROBILINOGEN negative 09/10/2015 1624   UROBILINOGEN 0.2 04/30/2015 0346   NITRITE neg 09/10/2015 1624   NITRITE NEGATIVE 04/30/2015 0346   LEUKOCYTESUR Negative 09/10/2015 1624    Radiological Exams on Admission: Ct Abdomen Pelvis Wo Contrast  06/28/2016  CLINICAL DATA:  Worsening  abdominal pain. EXAM: CT ABDOMEN AND PELVIS WITHOUT CONTRAST TECHNIQUE: Multidetector CT imaging of the abdomen and pelvis was performed following the standard protocol without IV contrast. COMPARISON:  Abdomen radiograph dated 06/23/2016. Abdomen and pelvis CT dated 02/04/2016. FINDINGS: Lower chest: Minimal patchy atelectasis or scarring in the left lower lobe anteriorly. Hepatobiliary: Distended gallbladder without abnormal dilatation, wall thickening or visible gallstones. Normal non contrasted appearance of the liver. Pancreas: No mass or inflammatory process identified on this un-enhanced exam. Spleen: Within normal limits in size and appearance. Adrenals/Urinary Tract: Normal appearing adrenal glands, kidneys, ureters and urinary bladder. No calculi or hydronephrosis. No visible mass. Stomach/Bowel: Dilated, fluid-filled small bowel loops throughout the abdomen pelvis to the level of the terminal ileum. There is some matting of the small bowel loops at that location with some fecalization of the bowel contents. Normal caliber colon. Prominent stool in the rectum. No evidence of appendicitis. Small to moderate-sized sliding hiatal hernia with refluxed contrast in the distal esophagus. Vascular/Lymphatic: Atheromatous arterial calcifications, including the abdominal aorta. No enlarged lymph nodes. Reproductive: No mass or other significant abnormality. Other: None. Musculoskeletal: Lumbar spine degenerative changes at multiple levels. Mild anterior wedge deformities involving the L1, L2 and L3 vertebral bodies with minimal bony retropulsion. Mild diffuse compression deformity of the L5 vertebral body. No acute fracture lines. IMPRESSION: 1. Small bowel obstruction to the level of the terminal ileum were there is matting of the small bowel loops, suggesting adhesion formation. 2. Small to moderate-sized sliding hiatal hernia with gastroesophageal reflux. 3. Prominent stool in the rectum. 4. Aortic  atherosclerosis. Electronically Signed   By: Claudie Revering M.D.   On: 06/28/2016 21:35   Dg Chest 2 View  06/28/2016  CLINICAL DATA:  70 y/o  F; vomiting and abdominal pain. EXAM: CHEST  2 VIEW COMPARISON:  None. FINDINGS: The heart size and mediastinal contours are within normal limits. Both lungs are clear. The visualized skeletal structures are unremarkable. Prominent loops of small bowel in the left upper quadrant with fluid levels may represent obstruction or ileus. Stable biapical scarring. IMPRESSION: No active cardiopulmonary disease. Prominent loops of small bowel in the left upper quadrant with fluid levels may represent obstruction or ileus. Electronically Signed   By: Kristine Garbe M.D.   On: 06/28/2016 19:53  EKG: Independently reviewed.   Assessment/Plan   1. Small Bowel obstruction. Will place NPO and continue IV fluids and antiemetics. Will consider starting clear liquids tomorrow. Request general surgery consult. Per colonoscopy 12/2015 she has circumferential narrowing of small bowel, she also has B12 deficiency. This is concerning for Crohn's colitis. She has seen Dr Jenny Reichmann outpatient, and will need follow up with him.  I have not requested GI consultation at this time.   2. Chronic kidney disease, stage 2. Creatinine mildly elevated in the setting of acute illness. Continue IV fluids. Continue to monitor. 3. Hyperlipidemia. Continue statin 4. Essential Hypertension. Continue home medications  5. Hypothyroidism. Continue synthroid   DVT prophylaxis: Heparin Code Status: Full Family Communication: No family bedside Disposition Plan: Discharge in 24 to 48 hours Consults called: None Admission status: admit to medical floor, inpatient   Orvan Falconer, MD FACP.  [facility]  If 7PM-7AM, please contact night-coverage [email or web address] Password [facility]  06/28/2016, 9:49 PM   By signing my name below, I, Hilbert Odor, attest that this documentation has been  prepared under the direction and in the presence of Orvan Falconer, MD Electronically Signed: Hilbert Odor 06/28/2016, 10:19 PM    I, Dr. Orvan Falconer personally performed the services described in this documentaiton. All medical record entries made by the scribe were at my direction and in my presence. I have reviewed the chart and agree that the record reflects my personal performance and is accurate and complete

## 2016-06-28 NOTE — ED Provider Notes (Signed)
CSN: [id number]     Arrival date & time 06/28/16  1633 History   First MD Initiated Contact with Patient 06/28/16 1907     Chief Complaint  Patient presents with  . Abdominal Pain  . Emesis     HPI Pt was seen at 1905.  Per pt, c/o gradual onset and persistence of constant left sided abd "pain" for the past 5 days, worse since last night.  Has been associated with multiple intermittent episodes of N/V since yesterday.  Describes the abd pain as "sore."  Pt was evaluated by her PMD 4 days ago, dx diverticulitis, rx cipro/flagyl. States she is taking the medications as prescribed, but her symptoms are not improving. Denies diarrhea, no fevers, no back pain, no rash, no CP/SOB, no black or blood in stools or emesis.      Past Medical History  Diagnosis Date  . Hypertension   . Menopause   . Hypothyroid   . Mini stroke ([place])   . Hyperlipidemia   . B12 deficiency   . Shortness of breath   . Headache(784.0)   . Anxiety   . Pneumonia   . Anemia   . Depression   . Stroke [facility]) 2010    no deficits  . Alcoholism ([place]) 01/09/2012  . Hyponatremia 01/09/2012  . COPD (chronic obstructive pulmonary disease) ([place])   . Orthostatic syncope 10/02/2012  . Carotid artery narrowing 10/03/2012    On the right.  . Chronic kidney disease (CKD), stage II (mild)    Past Surgical History  Procedure Laterality Date  . Appendectomy    . Tonsillectomy    . Cataract extraction w/phaco  06/20/2012    Procedure: CATARACT EXTRACTION PHACO AND INTRAOCULAR LENS PLACEMENT (IOC);  Surgeon: Tonny Branch, MD;  Location: [facility];  Service: Ophthalmology;  Laterality: Right;  CDE:10.66  . Cataract extraction w/phaco  07/11/2012    Procedure: CATARACT EXTRACTION PHACO AND INTRAOCULAR LENS PLACEMENT (IOC);  Surgeon: Tonny Branch, MD;  Location: [facility];  Service: Ophthalmology;  Laterality: Left;  CDE: 12.69  . Colonoscopy N/A 12/17/2015    RMR: Marked circumferential ulceration of the ascending colon/Ileocecal valve ulceration  with biopsy most consistent with NSAID injury versus inflammatory bowel disease, pancolonoc diverticulosis redundant colon  . Esophagogastroduodenoscopy N/A 12/17/2015    [id number] gastric polyp, biopsy benign  . Agile capsule N/A 02/22/2016    dummy capsule remained in the distal ileum   Family History  Problem Relation Age of Onset  . GER disease Mother   . Hypertension Maternal Grandmother    Social History  Substance Use Topics  . Smoking status: Former Smoker -- 2.00 packs/day for 30 years    Types: Cigarettes    Quit date: 06/17/1992  . Smokeless tobacco: None  . Alcohol Use: No    Review of Systems ROS: Statement: All systems negative except as marked or noted in the HPI; Constitutional: Negative for fever and chills. ; ; Eyes: Negative for eye pain, redness and discharge. ; ; ENMT: Negative for ear pain, hoarseness, nasal congestion, sinus pressure and sore throat. ; ; Cardiovascular: Negative for chest pain, palpitations, diaphoresis, dyspnea and peripheral edema. ; ; Respiratory: Negative for cough, wheezing and stridor. ; ; Gastrointestinal: +N/V, abd pain. Negative for diarrhea, blood in stool, hematemesis, jaundice and rectal bleeding. . ; ; Genitourinary: Negative for dysuria, flank pain and hematuria. ; ; Musculoskeletal: Negative for back pain and neck pain. Negative for swelling and trauma.; ; Skin: Negative for pruritus, rash, abrasions, blisters,  bruising and skin lesion.; ; Neuro: Negative for headache, lightheadedness and neck stiffness. Negative for weakness, altered level of consciousness, altered mental status, extremity weakness, paresthesias, involuntary movement, seizure and syncope.      Allergies  Norvasc and Procardia  Home Medications   Prior to Admission medications   Medication Sig Start Date End Date Taking? Authorizing Provider  acetaminophen (TYLENOL) 500 MG tablet Take 1 tablet (500 mg total) by mouth every 6 (six) hours as needed for moderate  pain. Pain 07/12/15   Claretta Fraise, MD  aspirin EC 81 MG tablet Take 81 mg by mouth daily.    Historical Provider, MD  budesonide (ENTOCORT EC) 3 MG 24 hr capsule Take 3 capsules (9 mg total) by mouth daily. 03/15/16   Mahala Menghini, PA-C  celecoxib (CELEBREX) 200 MG capsule Take 1 capsule (200 mg total) by mouth daily. With food 05/12/16   Claretta Fraise, MD  ciprofloxacin (CIPRO) 500 MG tablet Take 1 tablet (500 mg total) by mouth 2 (two) times daily. 06/23/16   Mary-Margaret Hassell Done, FNP  cyclobenzaprine (FLEXERIL) 10 MG tablet Take 1 tablet (10 mg total) by mouth 3 (three) times daily as needed for muscle spasms. 03/07/16   Claretta Fraise, MD  diclofenac (VOLTAREN) 75 MG EC tablet Take 1 tablet (75 mg total) by mouth 2 (two) times daily. 03/29/16   Claretta Fraise, MD  DULoxetine (CYMBALTA) 60 MG capsule TAKE 1 CAPSULE DAILY 05/12/16   Claretta Fraise, MD  Ferrous Fumarate (HEMOCYTE - 106 MG FE) 324 (106 Fe) MG TABS tablet Take 1 tablet (106 mg of iron total) by mouth 2 (two) times daily. 05/12/16   Claretta Fraise, MD  folic acid (FOLVITE) 1 MG tablet Take 1 tablet (1 mg total) by mouth daily. 02/14/16   Claretta Fraise, MD  levothyroxine (SYNTHROID, LEVOTHROID) 112 MCG tablet Take 1 tablet (112 mcg total) by mouth daily. 05/15/16   Claretta Fraise, MD  metroNIDAZOLE (FLAGYL) 500 MG tablet Take 1 tablet (500 mg total) by mouth 2 (two) times daily. 06/23/16   Mary-Margaret Hassell Done, FNP  thiamine (VITAMIN B-1) 100 MG tablet Take 1 tablet (100 mg total) by mouth daily. 04/20/16   Claretta Fraise, MD  valsartan (DIOVAN) 160 MG tablet Take 2 tablets (320 mg total) by mouth daily. Patient taking differently: Take 160 mg by mouth daily.  02/14/16   Claretta Fraise, MD   BP 116/74 mmHg  Pulse 126  Temp(Src) 98.3 F (36.8 C) (Oral)  Resp 20  Ht 5\' 9"  (1.753 m)  Wt 160 lb (72.576 kg)  BMI 23.62 kg/m2  SpO2 97%  LMP 08/21/2013 Physical Exam  1905: Physical examination:  Nursing notes reviewed; Vital signs and O2 SAT reviewed;   Constitutional: Well developed, Well nourished, Well hydrated, In no acute distress; Head:  Normocephalic, atraumatic; Eyes: EOMI, PERRL, No scleral icterus; ENMT: Mouth and pharynx normal, Mucous membranes moist; Neck: Supple, Full range of motion, No lymphadenopathy; Cardiovascular: Regular rate and rhythm, No gallop; Respiratory: Breath sounds clear & equal bilaterally, No wheezes.  Speaking full sentences with ease, Normal respiratory effort/excursion; Chest: Nontender, Movement normal; Abdomen: Soft, +diffuse tenderness to palp. No rebound or guarding. Nondistended, Normal bowel sounds; Genitourinary: No CVA tenderness; Extremities: Pulses normal, No tenderness, No edema, No calf edema or asymmetry.; Neuro: AA&Ox3, Major CN grossly intact.  Speech clear. No gross focal motor or sensory deficits in extremities. Climbs on and off stretcher easily by herself. Gait steady.; Skin: Color normal, Warm, Dry.   ED Course  Procedures (  including critical care time) Labs Review  Imaging Review  I have personally reviewed and evaluated these images and lab results as part of my medical decision-making.   EKG Interpretation None      MDM  MDM Reviewed: previous chart, nursing note and vitals Reviewed previous: labs Interpretation: labs, x-ray and CT scan      Results for orders placed or performed during the hospital encounter of 06/28/16  Lipase, blood  Result Value Ref Range   Lipase 17 11 - 51 U/L  Comprehensive metabolic panel  Result Value Ref Range   Sodium 134 (L) 135 - 145 mmol/L   Potassium 4.8 3.5 - 5.1 mmol/L   Chloride 101 101 - 111 mmol/L   CO2 21 (L) 22 - 32 mmol/L   Glucose, Bld 175 (H) 65 - 99 mg/dL   BUN 30 (H) 6 - 20 mg/dL   Creatinine, Ser 1.84 (H) 0.44 - 1.00 mg/dL   Calcium 9.3 8.9 - 10.3 mg/dL   Total Protein 8.2 (H) 6.5 - 8.1 g/dL   Albumin 4.7 3.5 - 5.0 g/dL   AST 17 15 - 41 U/L   ALT 11 (L) 14 - 54 U/L   Alkaline Phosphatase 102 38 - 126 U/L   Total  Bilirubin 0.8 0.3 - 1.2 mg/dL   GFR calc non Af Amer 27 (L) >60 mL/min   GFR calc Af Amer 31 (L) >60 mL/min   Anion gap 12 5 - 15  CBC  Result Value Ref Range   WBC 18.0 (H) 4.0 - 10.5 K/uL   RBC 4.44 3.87 - 5.11 MIL/uL   Hemoglobin 12.3 12.0 - 15.0 g/dL   HCT 37.3 36.0 - 46.0 %   MCV 84.0 78.0 - 100.0 fL   MCH 27.7 26.0 - 34.0 pg   MCHC 33.0 30.0 - 36.0 g/dL   RDW 13.9 11.5 - 15.5 %   Platelets 378 150 - 400 K/uL  Lactic acid, plasma  Result Value Ref Range   Lactic Acid, Venous 2.2 (HH) 0.5 - 1.9 mmol/L  Lactic acid, plasma  Result Value Ref Range   Lactic Acid, Venous 1.5 0.5 - 1.9 mmol/L   Ct Abdomen Pelvis Wo Contrast 06/28/2016  CLINICAL DATA:  Worsening abdominal pain. EXAM: CT ABDOMEN AND PELVIS WITHOUT CONTRAST TECHNIQUE: Multidetector CT imaging of the abdomen and pelvis was performed following the standard protocol without IV contrast. COMPARISON:  Abdomen radiograph dated 06/23/2016. Abdomen and pelvis CT dated 02/04/2016. FINDINGS: Lower chest: Minimal patchy atelectasis or scarring in the left lower lobe anteriorly. Hepatobiliary: Distended gallbladder without abnormal dilatation, wall thickening or visible gallstones. Normal non contrasted appearance of the liver. Pancreas: No mass or inflammatory process identified on this un-enhanced exam. Spleen: Within normal limits in size and appearance. Adrenals/Urinary Tract: Normal appearing adrenal glands, kidneys, ureters and urinary bladder. No calculi or hydronephrosis. No visible mass. Stomach/Bowel: Dilated, fluid-filled small bowel loops throughout the abdomen pelvis to the level of the terminal ileum. There is some matting of the small bowel loops at that location with some fecalization of the bowel contents. Normal caliber colon. Prominent stool in the rectum. No evidence of appendicitis. Small to moderate-sized sliding hiatal hernia with refluxed contrast in the distal esophagus. Vascular/Lymphatic: Atheromatous arterial  calcifications, including the abdominal aorta. No enlarged lymph nodes. Reproductive: No mass or other significant abnormality. Other: None. Musculoskeletal: Lumbar spine degenerative changes at multiple levels. Mild anterior wedge deformities involving the L1, L2 and L3 vertebral bodies with minimal bony retropulsion. Mild  diffuse compression deformity of the L5 vertebral body. No acute fracture lines. IMPRESSION: 1. Small bowel obstruction to the level of the terminal ileum were there is matting of the small bowel loops, suggesting adhesion formation. 2. Small to moderate-sized sliding hiatal hernia with gastroesophageal reflux. 3. Prominent stool in the rectum. 4. Aortic atherosclerosis. Electronically Signed   By: Claudie Revering M.D.   On: 06/28/2016 21:35   Dg Chest 2 View 06/28/2016  CLINICAL DATA:  70 y/o  F; vomiting and abdominal pain. EXAM: CHEST  2 VIEW COMPARISON:  None. FINDINGS: The heart size and mediastinal contours are within normal limits. Both lungs are clear. The visualized skeletal structures are unremarkable. Prominent loops of small bowel in the left upper quadrant with fluid levels may represent obstruction or ileus. Stable biapical scarring. IMPRESSION: No active cardiopulmonary disease. Prominent loops of small bowel in the left upper quadrant with fluid levels may represent obstruction or ileus. Electronically Signed   By: Kristine Garbe M.D.   On: 06/28/2016 19:53    Results for AMRY, FINGERHUT (MRN [id number]) as of 06/28/2016 21:39  Ref. Range 02/07/2016 08:41 05/12/2016 10:36 06/28/2016 17:04  BUN Latest Ref Range: 6-20 mg/dL 32 (H) 30 (H) 30 (H)  Creatinine Latest Ref Range: 0.44-1.00 mg/dL 1.65 (H) 1.40 (H) 1.84 (H)     2200:  IVF bolus given for tachycardia and mildly elevated lactic acid with improvement. Dx and testing d/w pt and family.  Questions answered.  Verb understanding, agreeable to admit.   T/C to General Surgery Dr. Arnoldo Morale, case discussed, including:   HPI, pertinent PM/SHx, VS/PE, dx testing, ED course and treatment:  Agreeable to consult, requests to admit to medicine service. T/C to [facility] Dr. Marin Comment, case discussed, including:  HPI, pertinent PM/SHx, VS/PE, dx testing, ED course and treatment:  Agreeable to admit, requests he will come to the ED for evaluation.    Francine Graven, DO 07/01/16 2138

## 2016-06-29 ENCOUNTER — Encounter (HOSPITAL_COMMUNITY): Payer: Self-pay

## 2016-06-29 DIAGNOSIS — E039 Hypothyroidism, unspecified: Secondary | ICD-10-CM

## 2016-06-29 DIAGNOSIS — N182 Chronic kidney disease, stage 2 (mild): Secondary | ICD-10-CM

## 2016-06-29 DIAGNOSIS — R000 Tachycardia, unspecified: Secondary | ICD-10-CM

## 2016-06-29 DIAGNOSIS — K5669 Other intestinal obstruction: Secondary | ICD-10-CM

## 2016-06-29 DIAGNOSIS — N179 Acute kidney failure, unspecified: Secondary | ICD-10-CM

## 2016-06-29 DIAGNOSIS — D500 Iron deficiency anemia secondary to blood loss (chronic): Secondary | ICD-10-CM

## 2016-06-29 LAB — VITAMIN B12: Vitamin B-12: 230 pg/mL (ref 180–914)

## 2016-06-29 LAB — CBC
HCT: 29.5 % — ABNORMAL LOW (ref 36.0–46.0)
Hemoglobin: 9.5 g/dL — ABNORMAL LOW (ref 12.0–15.0)
MCH: 27.5 pg (ref 26.0–34.0)
MCHC: 32.2 g/dL (ref 30.0–36.0)
MCV: 85.3 fL (ref 78.0–100.0)
Platelets: 218 10*3/uL (ref 150–400)
RBC: 3.46 MIL/uL — ABNORMAL LOW (ref 3.87–5.11)
RDW: 14 % (ref 11.5–15.5)
WBC: 5.8 10*3/uL (ref 4.0–10.5)

## 2016-06-29 LAB — BASIC METABOLIC PANEL
Anion gap: 8 (ref 5–15)
BUN: 32 mg/dL — ABNORMAL HIGH (ref 6–20)
CO2: 21 mmol/L — ABNORMAL LOW (ref 22–32)
Calcium: 7.7 mg/dL — ABNORMAL LOW (ref 8.9–10.3)
Chloride: 107 mmol/L (ref 101–111)
Creatinine, Ser: 1.84 mg/dL — ABNORMAL HIGH (ref 0.44–1.00)
GFR calc Af Amer: 31 mL/min — ABNORMAL LOW (ref >60)
GFR calc non Af Amer: 27 mL/min — ABNORMAL LOW (ref >60)
Glucose, Bld: 127 mg/dL — ABNORMAL HIGH (ref 65–99)
Potassium: 4.4 mmol/L (ref 3.5–5.1)
Sodium: 136 mmol/L (ref 135–145)

## 2016-06-29 LAB — TSH: TSH: 0.152 u[IU]/mL — ABNORMAL LOW (ref 0.350–4.500)

## 2016-06-29 MED ORDER — VITAMIN B-1 100 MG PO TABS
100.0000 mg | ORAL_TABLET | Freq: Every day | ORAL | Status: DC
  Administered 2016-06-29 – 2016-06-30 (×2): 100 mg via ORAL
  Filled 2016-06-29 (×2): qty 1

## 2016-06-29 MED ORDER — HYDRALAZINE HCL 20 MG/ML IJ SOLN
5.0000 mg | INTRAMUSCULAR | Status: DC | PRN
Start: 2016-06-29 — End: 2016-07-02

## 2016-06-29 MED ORDER — METOPROLOL SUCCINATE ER 25 MG PO TB24
12.5000 mg | ORAL_TABLET | Freq: Two times a day (BID) | ORAL | Status: DC
  Administered 2016-06-30: 12.5 mg via ORAL
  Filled 2016-06-29: qty 1

## 2016-06-29 MED ORDER — SODIUM CHLORIDE 0.9 % IV SOLN
INTRAVENOUS | Status: DC
  Administered 2016-06-29: 19:00:00 via INTRAVENOUS

## 2016-06-29 MED ORDER — CIPROFLOXACIN IN D5W 400 MG/200ML IV SOLN
400.0000 mg | Freq: Two times a day (BID) | INTRAVENOUS | Status: DC
  Administered 2016-06-29 – 2016-06-30 (×4): 400 mg via INTRAVENOUS
  Filled 2016-06-29 (×4): qty 200

## 2016-06-29 MED ORDER — LEVOTHYROXINE SODIUM 112 MCG PO TABS
112.0000 ug | ORAL_TABLET | Freq: Every day | ORAL | Status: DC
  Administered 2016-06-29: 112 ug via ORAL
  Filled 2016-06-29: qty 1

## 2016-06-29 MED ORDER — MILK AND MOLASSES ENEMA
1.0000 | Freq: Once | RECTAL | Status: AC
  Administered 2016-06-29: 250 mL via RECTAL

## 2016-06-29 MED ORDER — IRBESARTAN 150 MG PO TABS
150.0000 mg | ORAL_TABLET | Freq: Every day | ORAL | Status: DC
  Administered 2016-06-29: 150 mg via ORAL
  Filled 2016-06-29: qty 1

## 2016-06-29 MED ORDER — DULOXETINE HCL 60 MG PO CPEP
60.0000 mg | ORAL_CAPSULE | Freq: Every day | ORAL | Status: DC
Start: 2016-06-29 — End: 2016-07-02
  Administered 2016-06-29 – 2016-07-02 (×3): 60 mg via ORAL
  Filled 2016-06-29 (×4): qty 1

## 2016-06-29 MED ORDER — ACETAMINOPHEN 500 MG PO TABS
500.0000 mg | ORAL_TABLET | Freq: Two times a day (BID) | ORAL | Status: DC
  Administered 2016-06-29 – 2016-07-02 (×7): 500 mg via ORAL
  Filled 2016-06-29 (×9): qty 1

## 2016-06-29 MED ORDER — HEPARIN SODIUM (PORCINE) 5000 UNIT/ML IJ SOLN
5000.0000 [IU] | Freq: Three times a day (TID) | INTRAMUSCULAR | Status: DC
  Administered 2016-06-29 – 2016-07-02 (×10): 5000 [IU] via SUBCUTANEOUS
  Filled 2016-06-29 (×10): qty 1

## 2016-06-29 MED ORDER — MORPHINE SULFATE (PF) 2 MG/ML IV SOLN
2.0000 mg | INTRAVENOUS | Status: DC | PRN
  Filled 2016-06-29: qty 1

## 2016-06-29 MED ORDER — DEXTROSE-NACL 5-0.9 % IV SOLN
INTRAVENOUS | Status: DC
  Administered 2016-06-29 (×2): via INTRAVENOUS

## 2016-06-29 MED ORDER — ASPIRIN EC 81 MG PO TBEC
81.0000 mg | DELAYED_RELEASE_TABLET | Freq: Every day | ORAL | Status: DC
  Administered 2016-06-29 – 2016-07-02 (×4): 81 mg via ORAL
  Filled 2016-06-29 (×4): qty 1

## 2016-06-29 NOTE — Consult Note (Signed)
Reason for Consult: Small bowel obstruction Referring Physician: Dr. Dorathy Daft is an 70 y.o. female.  HPI: Patient is a 70 year old white female who presented emergency room yesterday with worsening abdominal pain and distention. She states she was in her usual health up until that time. She denied any fever or chills. She states she has a bowel movement every other day. Her last bowel movement was somewhat decreased in caliber. No hematochezia has been noted. Her only abdominal surgery was an appendectomy at the age of 43. No nausea or vomiting have been noted. While in the emergency room, she received 1 dose of pain medication and this resolved her abdominal pain. CT scan of the abdomen revealed a small bowel obstruction secondary to adhesive disease developing around the terminal ileum. She was admitted to the hospital for further evaluation and treatment. Currently, she has no abdominal pain. She has not had a bowel movement since her admission. She last passed flatus yesterday.  Past Medical History  Diagnosis Date  . Hypertension   . Menopause   . Hypothyroid   . Mini stroke ([place])   . Hyperlipidemia   . B12 deficiency   . Shortness of breath   . Headache(784.0)   . Anxiety   . Pneumonia   . Anemia   . Depression   . Stroke [facility]) 2010    no deficits  . Alcoholism ([place]) 01/09/2012  . Hyponatremia 01/09/2012  . COPD (chronic obstructive pulmonary disease) ([place])   . Orthostatic syncope 10/02/2012  . Carotid artery narrowing 10/03/2012    On the right.  . Chronic kidney disease (CKD), stage II (mild)     Past Surgical History  Procedure Laterality Date  . Appendectomy    . Tonsillectomy    . Cataract extraction w/phaco  06/20/2012    Procedure: CATARACT EXTRACTION PHACO AND INTRAOCULAR LENS PLACEMENT (IOC);  Surgeon: Tonny Branch, MD;  Location: [facility];  Service: Ophthalmology;  Laterality: Right;  CDE:10.66  . Cataract extraction w/phaco  07/11/2012    Procedure: CATARACT  EXTRACTION PHACO AND INTRAOCULAR LENS PLACEMENT (IOC);  Surgeon: Tonny Branch, MD;  Location: [facility];  Service: Ophthalmology;  Laterality: Left;  CDE: 12.69  . Colonoscopy N/A 12/17/2015    RMR: Marked circumferential ulceration of the ascending colon/Ileocecal valve ulceration with biopsy most consistent with NSAID injury versus inflammatory bowel disease, pancolonoc diverticulosis redundant colon  . Esophagogastroduodenoscopy N/A 12/17/2015    IHK:[id number] gastric polyp, biopsy benign  . Agile capsule N/A 02/22/2016    dummy capsule remained in the distal ileum    Family History  Problem Relation Age of Onset  . GER disease Mother   . Hypertension Maternal Grandmother     Social History:  reports that she quit smoking about 24 years ago. Her smoking use included Cigarettes. She has a 60 pack-year smoking history. She does not have any smokeless tobacco history on file. She reports that she does not drink alcohol or use illicit drugs.  Allergies:  Allergies  Allergen Reactions  . Norvasc [Amlodipine Besylate] Hives  . Procardia [Nifedipine] Swelling    Edema      Medications:  Prior to Admission:  Prescriptions prior to admission  Medication Sig Dispense Refill Last Dose  . acetaminophen (TYLENOL) 500 MG tablet Take 1 tablet (500 mg total) by mouth every 6 (six) hours as needed for moderate pain. Pain (Patient taking differently: Take 500 mg by mouth 2 (two) times daily. Pain) 90 tablet 4 06/27/2016 at Unknown time  .  aspirin EC 81 MG tablet Take 81 mg by mouth daily.   06/27/2016 at Unknown time  . budesonide (ENTOCORT EC) 3 MG 24 hr capsule Take 3 capsules (9 mg total) by mouth daily. 90 capsule 1 06/27/2016 at Unknown time  . celecoxib (CELEBREX) 200 MG capsule Take 1 capsule (200 mg total) by mouth daily. With food 90 capsule 1 06/27/2016 at Unknown time  . ciprofloxacin (CIPRO) 500 MG tablet Take 1 tablet (500 mg total) by mouth 2 (two) times daily. 20 tablet 0 06/27/2016 at Unknown  time  . cyclobenzaprine (FLEXERIL) 10 MG tablet Take 1 tablet (10 mg total) by mouth 3 (three) times daily as needed for muscle spasms. (Patient taking differently: Take 10 mg by mouth 3 (three) times daily. ) 270 tablet 1 06/27/2016 at Unknown time  . DULoxetine (CYMBALTA) 60 MG capsule TAKE 1 CAPSULE DAILY 90 capsule 0 06/27/2016 at Unknown time  . Ferrous Fumarate (HEMOCYTE - 106 MG FE) 324 (106 Fe) MG TABS tablet Take 1 tablet (106 mg of iron total) by mouth 2 (two) times daily. 60 tablet 5 06/26/2016 at Unknown time  . folic acid (FOLVITE) 1 MG tablet Take 1 tablet (1 mg total) by mouth daily. 90 tablet 1 06/27/2016 at Unknown time  . levothyroxine (SYNTHROID, LEVOTHROID) 112 MCG tablet Take 1 tablet (112 mcg total) by mouth daily. 90 tablet 1 06/27/2016 at Unknown time  . metroNIDAZOLE (FLAGYL) 500 MG tablet Take 1 tablet (500 mg total) by mouth 2 (two) times daily. 14 tablet 0 06/27/2016 at Unknown time  . thiamine (VITAMIN B-1) 100 MG tablet Take 1 tablet (100 mg total) by mouth daily. 90 tablet 3 06/27/2016 at Unknown time  . valsartan (DIOVAN) 160 MG tablet Take 2 tablets (320 mg total) by mouth daily. (Patient taking differently: Take 160 mg by mouth daily. ) 180 tablet 1 06/27/2016 at Unknown time   Scheduled: . acetaminophen  500 mg Oral BID  . aspirin EC  81 mg Oral Daily  . DULoxetine  60 mg Oral Daily  . heparin  5,000 Units Subcutaneous Q8H  . irbesartan  150 mg Oral Daily  . levothyroxine  112 mcg Oral QAC breakfast  . milk and molasses  1 enema Rectal Once  . thiamine  100 mg Oral Daily    Results for orders placed or performed during the hospital encounter of 06/28/16 (from the past 48 hour(s))  Lipase, blood     Status: None   Collection Time: 06/28/16  5:04 PM  Result Value Ref Range   Lipase 17 11 - 51 U/L  Comprehensive metabolic panel     Status: Abnormal   Collection Time: 06/28/16  5:04 PM  Result Value Ref Range   Sodium 134 (L) 135 - 145 mmol/L   Potassium 4.8 3.5 -  5.1 mmol/L   Chloride 101 101 - 111 mmol/L   CO2 21 (L) 22 - 32 mmol/L   Glucose, Bld 175 (H) 65 - 99 mg/dL   BUN 30 (H) 6 - 20 mg/dL   Creatinine, Ser 1.84 (H) 0.44 - 1.00 mg/dL   Calcium 9.3 8.9 - 10.3 mg/dL   Total Protein 8.2 (H) 6.5 - 8.1 g/dL   Albumin 4.7 3.5 - 5.0 g/dL   AST 17 15 - 41 U/L   ALT 11 (L) 14 - 54 U/L   Alkaline Phosphatase 102 38 - 126 U/L   Total Bilirubin 0.8 0.3 - 1.2 mg/dL   GFR calc non Af Amer 27 (  L) >60 mL/min   GFR calc Af Amer 31 (L) >60 mL/min    Comment: (NOTE) The eGFR has been calculated using the CKD EPI equation. This calculation has not been validated in all clinical situations. eGFR's persistently <60 mL/min signify possible Chronic Kidney Disease.    Anion gap 12 5 - 15  CBC     Status: Abnormal   Collection Time: 06/28/16  5:04 PM  Result Value Ref Range   WBC 18.0 (H) 4.0 - 10.5 K/uL   RBC 4.44 3.87 - 5.11 MIL/uL   Hemoglobin 12.3 12.0 - 15.0 g/dL   HCT 37.3 36.0 - 46.0 %   MCV 84.0 78.0 - 100.0 fL   MCH 27.7 26.0 - 34.0 pg   MCHC 33.0 30.0 - 36.0 g/dL   RDW 13.9 11.5 - 15.5 %   Platelets 378 150 - 400 K/uL  Lactic acid, plasma     Status: Abnormal   Collection Time: 06/28/16  5:04 PM  Result Value Ref Range   Lactic Acid, Venous 2.2 (HH) 0.5 - 1.9 mmol/L    Comment: CRITICAL RESULT CALLED TO, READ BACK BY AND VERIFIED WITH: VAUGHN,J ON 06/28/16 AT 1950 BY LOY,C   TSH     Status: Abnormal   Collection Time: 06/28/16  5:28 PM  Result Value Ref Range   TSH 0.152 (L) 0.350 - 4.500 uIU/mL  Lactic acid, plasma     Status: None   Collection Time: 06/28/16  8:26 PM  Result Value Ref Range   Lactic Acid, Venous 1.5 0.5 - 1.9 mmol/L  Urinalysis, Routine w reflex microscopic     Status: Abnormal   Collection Time: 06/28/16  9:52 PM  Result Value Ref Range   Color, Urine AMBER (A) YELLOW    Comment: BIOCHEMICALS MAY BE AFFECTED BY COLOR   APPearance CLEAR CLEAR   Specific Gravity, Urine >1.030 (H) 1.005 - 1.030   pH 5.0 5.0 - 8.0    Glucose, UA NEGATIVE NEGATIVE mg/dL   Hgb urine dipstick NEGATIVE NEGATIVE   Bilirubin Urine SMALL (A) NEGATIVE   Ketones, ur TRACE (A) NEGATIVE mg/dL   Protein, ur 30 (A) NEGATIVE mg/dL   Nitrite NEGATIVE NEGATIVE   Leukocytes, UA NEGATIVE NEGATIVE  Urine microscopic-add on     Status: Abnormal   Collection Time: 06/28/16  9:52 PM  Result Value Ref Range   Squamous Epithelial / LPF 0-5 (A) NONE SEEN   WBC, UA 0-5 0 - 5 WBC/hpf   RBC / HPF NONE SEEN 0 - 5 RBC/hpf   Bacteria, UA FEW (A) NONE SEEN   Casts HYALINE CASTS (A) NEGATIVE    Ct Abdomen Pelvis Wo Contrast  06/28/2016  CLINICAL DATA:  Worsening abdominal pain. EXAM: CT ABDOMEN AND PELVIS WITHOUT CONTRAST TECHNIQUE: Multidetector CT imaging of the abdomen and pelvis was performed following the standard protocol without IV contrast. COMPARISON:  Abdomen radiograph dated 06/23/2016. Abdomen and pelvis CT dated 02/04/2016. FINDINGS: Lower chest: Minimal patchy atelectasis or scarring in the left lower lobe anteriorly. Hepatobiliary: Distended gallbladder without abnormal dilatation, wall thickening or visible gallstones. Normal non contrasted appearance of the liver. Pancreas: No mass or inflammatory process identified on this un-enhanced exam. Spleen: Within normal limits in size and appearance. Adrenals/Urinary Tract: Normal appearing adrenal glands, kidneys, ureters and urinary bladder. No calculi or hydronephrosis. No visible mass. Stomach/Bowel: Dilated, fluid-filled small bowel loops throughout the abdomen pelvis to the level of the terminal ileum. There is some matting of the small bowel loops at  that location with some fecalization of the bowel contents. Normal caliber colon. Prominent stool in the rectum. No evidence of appendicitis. Small to moderate-sized sliding hiatal hernia with refluxed contrast in the distal esophagus. Vascular/Lymphatic: Atheromatous arterial calcifications, including the abdominal aorta. No enlarged lymph  nodes. Reproductive: No mass or other significant abnormality. Other: None. Musculoskeletal: Lumbar spine degenerative changes at multiple levels. Mild anterior wedge deformities involving the L1, L2 and L3 vertebral bodies with minimal bony retropulsion. Mild diffuse compression deformity of the L5 vertebral body. No acute fracture lines. IMPRESSION: 1. Small bowel obstruction to the level of the terminal ileum were there is matting of the small bowel loops, suggesting adhesion formation. 2. Small to moderate-sized sliding hiatal hernia with gastroesophageal reflux. 3. Prominent stool in the rectum. 4. Aortic atherosclerosis. Electronically Signed   By: Claudie Revering M.D.   On: 06/28/2016 21:35   Dg Chest 2 View  06/28/2016  CLINICAL DATA:  70 y/o  F; vomiting and abdominal pain. EXAM: CHEST  2 VIEW COMPARISON:  None. FINDINGS: The heart size and mediastinal contours are within normal limits. Both lungs are clear. The visualized skeletal structures are unremarkable. Prominent loops of small bowel in the left upper quadrant with fluid levels may represent obstruction or ileus. Stable biapical scarring. IMPRESSION: No active cardiopulmonary disease. Prominent loops of small bowel in the left upper quadrant with fluid levels may represent obstruction or ileus. Electronically Signed   By: Kristine Garbe M.D.   On: 06/28/2016 19:53    ROS:  Pertinent items noted in HPI and remainder of comprehensive ROS otherwise negative.  Blood pressure 147/65, pulse 110, temperature 98.3 F (36.8 C), temperature source Oral, resp. rate 18, height _0  (1.753 m), weight 65.8 kg (145 lb 1 oz), last menstrual period 08/21/2013, SpO2 100 %. Physical Exam: Well-developed and well-nourished white female in no acute distress. Head examination reveals normocephaly, atraumatic. Neck is supple without lymphadenopathy or carotid bruits. Lungs clear to auscultation with equal breath sounds bilaterally. Heart examination  reveals a regular rate and rhythm without S3, S4, murmurs. Abdomen is soft with slight distention noted. No hepatosplenomegaly, masses, or hernias are identified. No rigidity is noted.  Bowel sounds are present.  Assessment/Plan: Impression: Small bowel obstruction secondary to adhesive disease, also possibly secondary to enteritis. Plan: Will write for enema. Would suggest starting ciprofloxacin to cover enteritis. No need for acute surgical intervention.  Khamron Gellert A 06/29/2016, 8:13 AM

## 2016-06-29 NOTE — Progress Notes (Addendum)
PROGRESS NOTE    [name]  [id number] DOB: [birth year] DOA: 06/28/2016 PCP: Claretta Fraise, MD gastroenterologist-Dr. Gala Romney   Brief Narrative:  Patient is a 70 year old woman with a history of HTN, COPD, chronic anxiety, and hypothyroidism. She also had a colonoscopy in January 2017 which revealed circumferential narrowing of the small bowel suspicious for Crohn's disease. She is followed by Dr. Hazle Nordmann. who presented to the ED on 06/28/2016 with a chief complaint of abdominal pain/nausea/vomiting. She reported seeing her PCP for her pain and was prescribed Cipro and Flagyl for presumed acute diverticulitis. Her symptoms did not abate. In the ED, she was afebrile and hemodynamically stable. Her white blood cell count was elevated at 18. CT of her abdomen and pelvis revealed small bowel obstruction at the level of the terminal ileum where there was matting of the small bowel loops, suggesting adhesion; small to moderate sliding hiatal hernia with reflux, and prominent stool in the rectum. She was admitted for further evaluation and management.  Assessment & Plan:   Principal Problem:   SBO (small bowel obstruction) (H[facility]) Active Problems:   Hyperlipidemia   B12 deficiency   Hypothyroidism   Chronic kidney disease (CKD), stage II (mild)   Iron deficiency anemia due to chronic blood loss   Acute renal failure ([facility])   Sinus tachycardia ([place])   1. Small bowel obstruction. The patient was made to be nothing by mouth. NG tube was not inserted as she had no further persistent vomiting. Antiemetics were ordered as needed. Maintenance IV fluids were started. General surgery was consulted. Dr. Arnoldo Morale assess the patient and agrees that she had small bowel obstruction secondary to adhesive disease, but also possibly secondary to enteritis. -He ordered an enema and started Cipro empirically to cover enteritis. He did not recommend surgical intervention at this time. -Clear liquid diet was started  which she tolerated this morning.  Query Crohn's disease. We'll curbside consult GI.  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 Her recent creatinine appears to range from 1.4-1.65, indicating underlying chronic kidney disease.  -She was started on vigorous IV fluids as the increase in her creatinine was thought to be secondary to prerenal azotemia from volume depletion and also in the setting of ARB therapy. Her creatinine has not improved. -We'll continue IV fluids. Will discontinue the ARB.  Hypertension. Patient is treated with valsartan chronically. Her blood pressure was normotensive, but has increased progressively.  -Valsartan was restarted, but it will be discontinued due to acute on chronic renal failure. -Due to her elevated blood pressures and mild tachycardia, will add low-dose Toprol-XL for treatment. Will add as needed IV hydralazine.  Hypothyroidism. Patient was continued on Synthroid. Her TSH is low at 0.152. She may have iatrogenic hyperthyroidism. -We'll hold the Synthroid and order a free T4 free T3 for further evaluation. Open (of note, it appeared that her TSH was low and her free T4 was elevated one month ago). Will inquire further.  Chronic anxiety. Patient was restarted on Cymbalta. Currently stable.  Hyperglycemia. Patient is venous glucose is elevated, likely from dextrose and IV fluids. Will discontinue dextrose and IV fluids.     DVT prophylaxis: Subcutaneous heparin Code Status: Full code Family Communication: Family not available Disposition Plan: Discharge with clinically appropriate, likely in a few days.   Consultants:   Gen. surgery  Procedures:   None  Antimicrobials:  Cipro 06/29/16   Subjective: Patient says that she feels better. She denies  having a bowel movement this morning. She has not passed gas yet. Diet was advanced to clear liquids by general surgery  which she tolerated well.  Objective: Filed Vitals:   06/28/16 2033 06/28/16 2235 06/29/16 0010 06/29/16 0536  BP: 144/71 138/75 119/61 147/65  Pulse: 80 84 108 110  Temp:  98.5 F (36.9 C) 98.6 F (37 C) 98.3 F (36.8 C)  TempSrc:  Oral Oral Oral  Resp: 18 18  18   Height:   5\' 9"  (1.753 m)   Weight:   65.8 kg (145 lb 1 oz)   SpO2: 94% 95% 100% 100%    Intake/Output Summary (Last 24 hours) at 06/29/16 1604 Last data filed at 06/29/16 0900  Gross per 24 hour  Intake    360 ml  Output      0 ml  Net    360 ml   Filed Weights   06/28/16 1648 06/29/16 0010  Weight: 72.576 kg (160 lb) 65.8 kg (145 lb 1 oz)    Examination:  General exam: Appears calm and comfortable  Respiratory system: Clear to auscultation. Respiratory effort normal. Cardiovascular system: S1, S2, with tachycardia. No JVD, murmurs, rubs, gallops or clicks. No pedal edema. Gastrointestinal system: Abdomen is mildly distended and mildly tender in the epigastrium into the right of the epigastrium. No organomegaly or masses felt. Normal bowel sounds heard. Central nervous system: Alert and oriented. No focal neurological deficits. Extremities: Symmetric 5 x 5 power. Skin: No rashes, lesions or ulcers Psychiatry: Judgement and insight appear normal. Mood & affect appropriate.     Data Reviewed: I have personally reviewed following labs and imaging studies  CBC:  Recent Labs Lab 06/28/16 1704 06/29/16 0756  WBC 18.0* 5.8  HGB 12.3 9.5*  HCT 37.3 29.5*  MCV 84.0 85.3  PLT 378 [id number]   Basic Metabolic Panel:  Recent Labs Lab 06/28/16 1704 06/29/16 0756  NA 134* 136  K 4.8 4.4  CL 101 107  CO2 21* 21*  GLUCOSE 175* 127*  BUN 30* 32*  CREATININE 1.84* 1.84*  CALCIUM 9.3 7.7*   GFR: Estimated Creatinine Clearance: 30.4 mL/min (by C-G formula based on Cr of 1.84). Liver Function Tests:  Recent Labs Lab 06/28/16 1704  AST 17  ALT 11*  ALKPHOS 102  BILITOT 0.8  PROT 8.2*  ALBUMIN 4.7     Recent Labs Lab 06/28/16 1704  LIPASE 17   No results for input(s): AMMONIA in the last 168 hours. Coagulation Profile: No results for input(s): INR, PROTIME in the last 168 hours. Cardiac Enzymes: No results for input(s): CKTOTAL, CKMB, CKMBINDEX, TROPONINI in the last 168 hours. BNP (last 3 results) No results for input(s): PROBNP in the last 8760 hours. HbA1C: No results for input(s): HGBA1C in the last 72 hours. CBG: No results for input(s): GLUCAP in the last 168 hours. Lipid Profile: No results for input(s): CHOL, HDL, LDLCALC, TRIG, CHOLHDL, LDLDIRECT in the last 72 hours. Thyroid Function Tests:  Recent Labs  06/28/16 1728  TSH 0.152*   Anemia Panel:  Recent Labs  06/28/16 1728  VITAMINB12 230   Sepsis Labs:  Recent Labs Lab 06/28/16 1704 06/28/16 2026  LATICACIDVEN 2.2* 1.5    No results found for this or any previous visit (from the past 240 hour(s)).       Radiology Studies: Ct Abdomen Pelvis Wo Contrast  06/28/2016  CLINICAL DATA:  Worsening abdominal pain. EXAM: CT ABDOMEN AND PELVIS WITHOUT CONTRAST TECHNIQUE: Multidetector CT imaging of the abdomen and  pelvis was performed following the standard protocol without IV contrast. COMPARISON:  Abdomen radiograph dated 06/23/2016. Abdomen and pelvis CT dated 02/04/2016. FINDINGS: Lower chest: Minimal patchy atelectasis or scarring in the left lower lobe anteriorly. Hepatobiliary: Distended gallbladder without abnormal dilatation, wall thickening or visible gallstones. Normal non contrasted appearance of the liver. Pancreas: No mass or inflammatory process identified on this un-enhanced exam. Spleen: Within normal limits in size and appearance. Adrenals/Urinary Tract: Normal appearing adrenal glands, kidneys, ureters and urinary bladder. No calculi or hydronephrosis. No visible mass. Stomach/Bowel: Dilated, fluid-filled small bowel loops throughout the abdomen pelvis to the level of the terminal ileum.  There is some matting of the small bowel loops at that location with some fecalization of the bowel contents. Normal caliber colon. Prominent stool in the rectum. No evidence of appendicitis. Small to moderate-sized sliding hiatal hernia with refluxed contrast in the distal esophagus. Vascular/Lymphatic: Atheromatous arterial calcifications, including the abdominal aorta. No enlarged lymph nodes. Reproductive: No mass or other significant abnormality. Other: None. Musculoskeletal: Lumbar spine degenerative changes at multiple levels. Mild anterior wedge deformities involving the L1, L2 and L3 vertebral bodies with minimal bony retropulsion. Mild diffuse compression deformity of the L5 vertebral body. No acute fracture lines. IMPRESSION: 1. Small bowel obstruction to the level of the terminal ileum were there is matting of the small bowel loops, suggesting adhesion formation. 2. Small to moderate-sized sliding hiatal hernia with gastroesophageal reflux. 3. Prominent stool in the rectum. 4. Aortic atherosclerosis. Electronically Signed   By: Claudie Revering M.D.   On: 06/28/2016 21:35   Dg Chest 2 View  06/28/2016  CLINICAL DATA:  70 y/o  F; vomiting and abdominal pain. EXAM: CHEST  2 VIEW COMPARISON:  None. FINDINGS: The heart size and mediastinal contours are within normal limits. Both lungs are clear. The visualized skeletal structures are unremarkable. Prominent loops of small bowel in the left upper quadrant with fluid levels may represent obstruction or ileus. Stable biapical scarring. IMPRESSION: No active cardiopulmonary disease. Prominent loops of small bowel in the left upper quadrant with fluid levels may represent obstruction or ileus. Electronically Signed   By: Kristine Garbe M.D.   On: 06/28/2016 19:53        Scheduled Meds: . acetaminophen  500 mg Oral BID  . aspirin EC  81 mg Oral Daily  . ciprofloxacin  400 mg Intravenous Q12H  . DULoxetine  60 mg Oral Daily  . heparin  5,000  Units Subcutaneous Q8H  . irbesartan  150 mg Oral Daily  . levothyroxine  112 mcg Oral QAC breakfast  . thiamine  100 mg Oral Daily   Continuous Infusions: . dextrose 5 % and 0.9% NaCl 125 mL/hr at 06/29/16 0947     LOS: 1 day    Time spent: 43 minutes    Rexene Alberts, MD [facility] Pager [phone]  If 7PM-7AM, please contact night-coverage [email or web address] Password [email or web address] 06/29/2016, 4:04 PM

## 2016-06-30 DIAGNOSIS — E038 Other specified hypothyroidism: Secondary | ICD-10-CM

## 2016-06-30 LAB — BASIC METABOLIC PANEL
Anion gap: 6 (ref 5–15)
BUN: 16 mg/dL (ref 6–20)
CO2: 22 mmol/L (ref 22–32)
Calcium: 8.4 mg/dL — ABNORMAL LOW (ref 8.9–10.3)
Chloride: 111 mmol/L (ref 101–111)
Creatinine, Ser: 1.11 mg/dL — ABNORMAL HIGH (ref 0.44–1.00)
GFR calc Af Amer: 58 mL/min — ABNORMAL LOW (ref >60)
GFR calc non Af Amer: 50 mL/min — ABNORMAL LOW (ref >60)
Glucose, Bld: 96 mg/dL (ref 65–99)
Potassium: 4.1 mmol/L (ref 3.5–5.1)
Sodium: 139 mmol/L (ref 135–145)

## 2016-06-30 LAB — CBC
HCT: 29.2 % — ABNORMAL LOW (ref 36.0–46.0)
Hemoglobin: 9.5 g/dL — ABNORMAL LOW (ref 12.0–15.0)
MCH: 27.5 pg (ref 26.0–34.0)
MCHC: 32.5 g/dL (ref 30.0–36.0)
MCV: 84.6 fL (ref 78.0–100.0)
Platelets: 195 10*3/uL (ref 150–400)
RBC: 3.45 MIL/uL — ABNORMAL LOW (ref 3.87–5.11)
RDW: 13.7 % (ref 11.5–15.5)
WBC: 4.6 10*3/uL (ref 4.0–10.5)

## 2016-06-30 LAB — T4, FREE: Free T4: 1.1 ng/dL (ref 0.61–1.12)

## 2016-06-30 MED ORDER — LEVOTHYROXINE SODIUM 112 MCG PO TABS
112.0000 ug | ORAL_TABLET | Freq: Every day | ORAL | Status: DC
  Administered 2016-07-01 – 2016-07-02 (×2): 112 ug via ORAL
  Filled 2016-06-30 (×2): qty 1

## 2016-06-30 MED ORDER — IRBESARTAN 75 MG PO TABS
75.0000 mg | ORAL_TABLET | Freq: Every day | ORAL | Status: DC
  Administered 2016-06-30 – 2016-07-02 (×3): 75 mg via ORAL
  Filled 2016-06-30 (×3): qty 1

## 2016-06-30 NOTE — Care Management Important Message (Signed)
Important Message  Patient Details  Name: [name] MRN: [id number] Date of Birth: [birth year]   Medicare Important Message Given:  Yes    Alvie Heidelberg, RN 06/30/2016, 3:40 PM

## 2016-06-30 NOTE — Care Management Note (Signed)
Case Management Note  Patient Details  Name: [name] MRN: [id number] Date of Birth: [birth year]  Subjective/Objective:      Spoke with patient who is alert and oriented from home. Stated that they can afford medications and are able to make thier medical appointments without difficulty. No DME or O2 at home. No CM needs identified.              Action/Plan: No needs Hom with self care.   Expected Discharge Date:                  Expected Discharge Plan:     In-House Referral:     Discharge planning Services  CM Consult  Post Acute Care Choice:    Choice offered to:     DME Arranged:    DME Agency:     HH Arranged:    [place] Agency:     Status of Service:  Completed, signed off  If discussed at [employer] of Stay Meetings, dates discussed:    Additional Comments:  Alvie Heidelberg, RN 06/30/2016, 5:29 PM

## 2016-06-30 NOTE — Progress Notes (Signed)
PROGRESS NOTE    [name]  [id number] DOB: 09/29/1946 DOA: 06/28/2016 PCP: Claretta Fraise, MD gastroenterologist-Dr. Gala Romney   Brief Narrative:  Patient is a 70 year old woman with a history of HTN, COPD, chronic anxiety, and hypothyroidism. She also had a colonoscopy in January 2017 which revealed circumferential narrowing of the small bowel suspicious for Crohn's disease. She is followed by Dr. Hazle Nordmann. who presented to the ED on 06/28/2016 with a chief complaint of abdominal pain/nausea/vomiting. She reported seeing her PCP for her pain and was prescribed Cipro and Flagyl for presumed acute diverticulitis. Her symptoms did not abate. In the ED, she was afebrile and hemodynamically stable. Her white blood cell count was elevated at 18. CT of her abdomen and pelvis revealed small bowel obstruction at the level of the terminal ileum where there was matting of the small bowel loops, suggesting adhesion; small to moderate sliding hiatal hernia with reflux, and prominent stool in the rectum. She was admitted for further evaluation and management.  Assessment & Plan:   Principal Problem:   SBO (small bowel obstruction) (HCC) Active Problems:   Hyperlipidemia   B12 deficiency   Hypothyroidism   Chronic kidney disease (CKD), stage II (mild)   Iron deficiency anemia due to chronic blood loss   Acute renal failure (HCC)   Sinus tachycardia ([name])   1. Small bowel obstruction. The patient was made to be nothing by mouth. NG tube was not inserted as she had no further persistent vomiting. Antiemetics were ordered as needed. Maintenance IV fluids were started. General surgery was consulted. Dr. Arnoldo Morale assess the patient and agrees that she had small bowel obstruction secondary to adhesive disease, but also possibly secondary to enteritis. Dr. Arnoldo Morale ordered Cipro to cover enteritis empirically. Milk of molasses enema was also ordered with good results. He did not recommend surgical intervention  at this time. -Diet was advanced to full liquids today.  Query Crohn's disease. Patient has seen Dr. Gala Romney in the recent past for colonoscopy. Dr. Arnoldo Morale has ordered a GI consult for further clarification.  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 Her recent creatinine appears to range from 1.4-1.65, indicating underlying chronic kidney disease.  -She was started on vigorous IV fluids as the increase in her creatinine was thought to be secondary to prerenal azotemia from volume depletion and also in the setting of ARB therapy. ARB was held temporarily. Her creatinine has improved.  Hypertension. Patient is treated with valsartan chronically. Her blood pressure was normotensive, but has increased progressively.  -Valsartan was restarted, but it was held due to acute on chronic renal failure. -Due to her elevated blood pressures and mild tachycardia,  low-dose Toprol-XL was added.  -Now that her renal function is improved, we will restart ARB at a lower dose. Will discontinue Toprol-XL now that her blood pressures better.  Hypothyroidism. Patient was continued on Synthroid. Her TSH is low at 0.152. She may have iatrogenic hyperthyroidism. -We'll hold the Synthroid and order a free T4 free T3 for further evaluation. Open (of note, it appeared that her TSH was low and her free T4 was elevated one month ago).  -Free T4 was within normal limits at 1.1. Will restart Synthroid  Chronic anxiety. Patient was restarted on Cymbalta. Currently stable.  Hyperglycemia. Patient's venous glucose was elevated, likely from dextrose in the  IV fluids.      DVT prophylaxis: Subcutaneous heparin Code Status: Full code Family Communication: Family not available Disposition  Plan: Discharge with clinically appropriate, likely in a few days.   Consultants:   Gen. surgery  Procedures:   None  Antimicrobials:  Cipro  06/29/16   Subjective: Patient is passing gas. No additional bowel movements since yesterday following the enema. No complaints of chest pain or shortness of breath. Patient does not recall being told that she had Crohn's disease.  Objective: Filed Vitals:   06/29/16 1633 06/29/16 1955 06/29/16 2118 06/30/16 0628  BP: 174/83  153/78 168/99  Pulse: 102  80 102  Temp: 98 F (36.7 C)  98.9 F (37.2 C) 98.8 F (37.1 C)  TempSrc: Oral  Oral Oral  Resp: 18  18 18   Height:      Weight:      SpO2: 10% 97% 95% 97%    Intake/Output Summary (Last 24 hours) at 06/30/16 0907 Last data filed at 06/29/16 1850  Gross per 24 hour  Intake    680 ml  Output      0 ml  Net    680 ml   Filed Weights   06/28/16 1648 06/29/16 0010  Weight: 72.576 kg (160 lb) 65.8 kg (145 lb 1 oz)    Examination:  General exam: Appears calm and comfortable  Respiratory system: Clear to auscultation. Respiratory effort normal. Cardiovascular system: S1, S2, with tachycardia. No JVD, murmurs, rubs, gallops or clicks. No pedal edema. Gastrointestinal system: Abdomen is less distended and mildly tender to the right of the epigastrium No organomegaly or masses felt. Normal bowel sounds heard. Central nervous system: Alert and oriented. No focal neurological deficits. Extremities: Symmetric 5 x 5 power. Skin: No rashes, lesions or ulcers Psychiatry: Judgement and insight appear normal. Mood & affect appropriate.     Data Reviewed: I have personally reviewed following labs and imaging studies  CBC:  Recent Labs Lab 06/28/16 1704 06/29/16 0756 06/30/16 0603  WBC 18.0* 5.8 4.6  HGB 12.3 9.5* 9.5*  HCT 37.3 29.5* 29.2*  MCV 84.0 85.3 84.6  PLT 378 218 0000000   Basic Metabolic Panel:  Recent Labs Lab 06/28/16 1704 06/29/16 0756 06/30/16 0603  NA 134* 136 139  K 4.8 4.4 4.1  CL 101 107 111  CO2 21* 21* 22  GLUCOSE 175* 127* 96  BUN 30* 32* 16  CREATININE 1.84* 1.84* 1.11*  CALCIUM 9.3 7.7* 8.4*    GFR: Estimated Creatinine Clearance: 50.4 mL/min (by C-G formula based on Cr of 1.11). Liver Function Tests:  Recent Labs Lab 06/28/16 1704  AST 17  ALT 11*  ALKPHOS 102  BILITOT 0.8  PROT 8.2*  ALBUMIN 4.7    Recent Labs Lab 06/28/16 1704  LIPASE 17   No results for input(s): AMMONIA in the last 168 hours. Coagulation Profile: No results for input(s): INR, PROTIME in the last 168 hours. Cardiac Enzymes: No results for input(s): CKTOTAL, CKMB, CKMBINDEX, TROPONINI in the last 168 hours. BNP (last 3 results) No results for input(s): PROBNP in the last 8760 hours. HbA1C: No results for input(s): HGBA1C in the last 72 hours. CBG: No results for input(s): GLUCAP in the last 168 hours. Lipid Profile: No results for input(s): CHOL, HDL, LDLCALC, TRIG, CHOLHDL, LDLDIRECT in the last 72 hours. Thyroid Function Tests:  Recent Labs  06/28/16 1728  TSH 0.152*   Anemia Panel:  Recent Labs  06/28/16 1728  VITAMINB12 230   Sepsis Labs:  Recent Labs Lab 06/28/16 1704 06/28/16 2026  LATICACIDVEN 2.2* 1.5    No results found for this or any  previous visit (from the past 240 hour(s)).       Radiology Studies: Ct Abdomen Pelvis Wo Contrast  06/28/2016  CLINICAL DATA:  Worsening abdominal pain. EXAM: CT ABDOMEN AND PELVIS WITHOUT CONTRAST TECHNIQUE: Multidetector CT imaging of the abdomen and pelvis was performed following the standard protocol without IV contrast. COMPARISON:  Abdomen radiograph dated 06/23/2016. Abdomen and pelvis CT dated 02/04/2016. FINDINGS: Lower chest: Minimal patchy atelectasis or scarring in the left lower lobe anteriorly. Hepatobiliary: Distended gallbladder without abnormal dilatation, wall thickening or visible gallstones. Normal non contrasted appearance of the liver. Pancreas: No mass or inflammatory process identified on this un-enhanced exam. Spleen: Within normal limits in size and appearance. Adrenals/Urinary Tract: Normal appearing  adrenal glands, kidneys, ureters and urinary bladder. No calculi or hydronephrosis. No visible mass. Stomach/Bowel: Dilated, fluid-filled small bowel loops throughout the abdomen pelvis to the level of the terminal ileum. There is some matting of the small bowel loops at that location with some fecalization of the bowel contents. Normal caliber colon. Prominent stool in the rectum. No evidence of appendicitis. Small to moderate-sized sliding hiatal hernia with refluxed contrast in the distal esophagus. Vascular/Lymphatic: Atheromatous arterial calcifications, including the abdominal aorta. No enlarged lymph nodes. Reproductive: No mass or other significant abnormality. Other: None. Musculoskeletal: Lumbar spine degenerative changes at multiple levels. Mild anterior wedge deformities involving the L1, L2 and L3 vertebral bodies with minimal bony retropulsion. Mild diffuse compression deformity of the L5 vertebral body. No acute fracture lines. IMPRESSION: 1. Small bowel obstruction to the level of the terminal ileum were there is matting of the small bowel loops, suggesting adhesion formation. 2. Small to moderate-sized sliding hiatal hernia with gastroesophageal reflux. 3. Prominent stool in the rectum. 4. Aortic atherosclerosis. Electronically Signed   By: Claudie Revering M.D.   On: 06/28/2016 21:35   Dg Chest 2 View  06/28/2016  CLINICAL DATA:  70 y/o  F; vomiting and abdominal pain. EXAM: CHEST  2 VIEW COMPARISON:  None. FINDINGS: The heart size and mediastinal contours are within normal limits. Both lungs are clear. The visualized skeletal structures are unremarkable. Prominent loops of small bowel in the left upper quadrant with fluid levels may represent obstruction or ileus. Stable biapical scarring. IMPRESSION: No active cardiopulmonary disease. Prominent loops of small bowel in the left upper quadrant with fluid levels may represent obstruction or ileus. Electronically Signed   By: Kristine Garbe  M.D.   On: 06/28/2016 19:53        Scheduled Meds: . acetaminophen  500 mg Oral BID  . aspirin EC  81 mg Oral Daily  . ciprofloxacin  400 mg Intravenous Q12H  . DULoxetine  60 mg Oral Daily  . heparin  5,000 Units Subcutaneous Q8H  . metoprolol succinate  12.5 mg Oral BID  . thiamine  100 mg Oral Daily   Continuous Infusions: . sodium chloride 125 mL/hr at 06/29/16 1903     LOS: 2 days    Time spent: 10 minutes    Rexene Alberts, MD [facility] Pager [phone]  If 7PM-7AM, please contact night-coverage [email or web address] Password [email or web address] 06/30/2016, 9:07 AM

## 2016-06-30 NOTE — Progress Notes (Signed)
Subjective: Patient states abdominal pain has eased. Was significantly helped with enema yesterday. She did have positive results. She is passing gas.  Objective: Vital signs in last 24 hours: Temp:  [98 F (36.7 C)-98.9 F (37.2 C)] 98.8 F (37.1 C) (07/21 0628) Pulse Rate:  [80-102] 102 (07/21 0628) Resp:  [18] 18 (07/21 0628) BP: (153-174)/(78-99) 168/99 mmHg (07/21 0628) SpO2:  [10 %-97 %] 97 % (07/21 0628) Last BM Date: 06/29/16  Intake/Output from previous day: 07/20 0701 - 07/21 0700 In: 1040 [P.O.:840; IV Piggyback:200] Out: -  Intake/Output this shift:    General appearance: alert, cooperative and no distress GI: Soft. Less fullness noted. No rigidity noted. Nontender.  Lab Results:   Recent Labs  06/29/16 0756 06/30/16 0603  WBC 5.8 4.6  HGB 9.5* 9.5*  HCT 29.5* 29.2*  PLT 218 195   BMET  Recent Labs  06/29/16 0756 06/30/16 0603  NA 136 139  K 4.4 4.1  CL 107 111  CO2 21* 22  GLUCOSE 127* 96  BUN 32* 16  CREATININE 1.84* 1.11*  CALCIUM 7.7* 8.4*   PT/INR No results for input(s): LABPROT, INR in the last 72 hours.  Studies/Results: Ct Abdomen Pelvis Wo Contrast  06/28/2016  CLINICAL DATA:  Worsening abdominal pain. EXAM: CT ABDOMEN AND PELVIS WITHOUT CONTRAST TECHNIQUE: Multidetector CT imaging of the abdomen and pelvis was performed following the standard protocol without IV contrast. COMPARISON:  Abdomen radiograph dated 06/23/2016. Abdomen and pelvis CT dated 02/04/2016. FINDINGS: Lower chest: Minimal patchy atelectasis or scarring in the left lower lobe anteriorly. Hepatobiliary: Distended gallbladder without abnormal dilatation, wall thickening or visible gallstones. Normal non contrasted appearance of the liver. Pancreas: No mass or inflammatory process identified on this un-enhanced exam. Spleen: Within normal limits in size and appearance. Adrenals/Urinary Tract: Normal appearing adrenal glands, kidneys, ureters and urinary bladder. No  calculi or hydronephrosis. No visible mass. Stomach/Bowel: Dilated, fluid-filled small bowel loops throughout the abdomen pelvis to the level of the terminal ileum. There is some matting of the small bowel loops at that location with some fecalization of the bowel contents. Normal caliber colon. Prominent stool in the rectum. No evidence of appendicitis. Small to moderate-sized sliding hiatal hernia with refluxed contrast in the distal esophagus. Vascular/Lymphatic: Atheromatous arterial calcifications, including the abdominal aorta. No enlarged lymph nodes. Reproductive: No mass or other significant abnormality. Other: None. Musculoskeletal: Lumbar spine degenerative changes at multiple levels. Mild anterior wedge deformities involving the L1, L2 and L3 vertebral bodies with minimal bony retropulsion. Mild diffuse compression deformity of the L5 vertebral body. No acute fracture lines. IMPRESSION: 1. Small bowel obstruction to the level of the terminal ileum were there is matting of the small bowel loops, suggesting adhesion formation. 2. Small to moderate-sized sliding hiatal hernia with gastroesophageal reflux. 3. Prominent stool in the rectum. 4. Aortic atherosclerosis. Electronically Signed   By: Claudie Revering M.D.   On: 06/28/2016 21:35   Dg Chest 2 View  06/28/2016  CLINICAL DATA:  70 y/o  F; vomiting and abdominal pain. EXAM: CHEST  2 VIEW COMPARISON:  None. FINDINGS: The heart size and mediastinal contours are within normal limits. Both lungs are clear. The visualized skeletal structures are unremarkable. Prominent loops of small bowel in the left upper quadrant with fluid levels may represent obstruction or ileus. Stable biapical scarring. IMPRESSION: No active cardiopulmonary disease. Prominent loops of small bowel in the left upper quadrant with fluid levels may represent obstruction or ileus. Electronically Signed   By: Mia [place]  [place] M.D.   On: 06/28/2016 19:53     Anti-infectives: Anti-infectives    Start     Dose/Rate Route Frequency Ordered Stop   06/29/16 0900  ciprofloxacin (CIPRO) IVPB 400 mg     400 mg 200 mL/hr over 60 Minutes Intravenous Every 12 hours 06/29/16 0824        Assessment/Plan: Impression: Small bowel obstruction resolving. No need for acute surgical intervention. Suspect enteritis as cause of her symptoms. Plan: May advance to full liquid diet. Continue IV ciprofloxacin. Will get GI involved.  LOS: 2 days    Kymir Coles A 06/30/2016

## 2016-07-01 DIAGNOSIS — F05 Delirium due to known physiological condition: Secondary | ICD-10-CM | POA: Diagnosis not present

## 2016-07-01 DIAGNOSIS — K5669 Other intestinal obstruction: Secondary | ICD-10-CM

## 2016-07-01 DIAGNOSIS — D500 Iron deficiency anemia secondary to blood loss (chronic): Secondary | ICD-10-CM

## 2016-07-01 LAB — CBC
HCT: 29.3 % — ABNORMAL LOW (ref 36.0–46.0)
Hemoglobin: 9.3 g/dL — ABNORMAL LOW (ref 12.0–15.0)
MCH: 27.3 pg (ref 26.0–34.0)
MCHC: 31.7 g/dL (ref 30.0–36.0)
MCV: 85.9 fL (ref 78.0–100.0)
Platelets: 241 10*3/uL (ref 150–400)
RBC: 3.41 MIL/uL — ABNORMAL LOW (ref 3.87–5.11)
RDW: 13.8 % (ref 11.5–15.5)
WBC: 5.6 10*3/uL (ref 4.0–10.5)

## 2016-07-01 LAB — BASIC METABOLIC PANEL
Anion gap: 7 (ref 5–15)
BUN: 10 mg/dL (ref 6–20)
CO2: 22 mmol/L (ref 22–32)
Calcium: 8.5 mg/dL — ABNORMAL LOW (ref 8.9–10.3)
Chloride: 108 mmol/L (ref 101–111)
Creatinine, Ser: 1.09 mg/dL — ABNORMAL HIGH (ref 0.44–1.00)
GFR calc Af Amer: 59 mL/min — ABNORMAL LOW (ref >60)
GFR calc non Af Amer: 51 mL/min — ABNORMAL LOW (ref >60)
Glucose, Bld: 89 mg/dL (ref 65–99)
Potassium: 4.1 mmol/L (ref 3.5–5.1)
Sodium: 137 mmol/L (ref 135–145)

## 2016-07-01 LAB — IRON AND TIBC
Iron: 38 ug/dL (ref 28–170)
Saturation Ratios: 11 % (ref 10.4–31.8)
TIBC: 343 ug/dL (ref 250–450)
UIBC: 305 ug/dL

## 2016-07-01 LAB — C-REACTIVE PROTEIN: CRP: 0.6 mg/dL (ref <1.0)

## 2016-07-01 LAB — FERRITIN: Ferritin: 11 ng/mL (ref 11–307)

## 2016-07-01 MED ORDER — LORAZEPAM 2 MG/ML IJ SOLN: 1.0000 mg | Freq: Four times a day (QID) | INTRAMUSCULAR | Status: DC | PRN

## 2016-07-01 MED ORDER — HALOPERIDOL LACTATE 5 MG/ML IJ SOLN
5.0000 mg | Freq: Once | INTRAMUSCULAR | Status: DC
  Filled 2016-07-01: qty 1

## 2016-07-01 MED ORDER — FOLIC ACID 1 MG PO TABS
1.0000 mg | ORAL_TABLET | Freq: Every day | ORAL | Status: DC
  Administered 2016-07-01 – 2016-07-02 (×2): 1 mg via ORAL
  Filled 2016-07-01 (×2): qty 1

## 2016-07-01 MED ORDER — LORAZEPAM 1 MG PO TABS
1.0000 mg | ORAL_TABLET | Freq: Four times a day (QID) | ORAL | Status: DC | PRN
  Administered 2016-07-01: 1 mg via ORAL
  Filled 2016-07-01: qty 1

## 2016-07-01 MED ORDER — VITAMIN B-1 100 MG PO TABS
100.0000 mg | ORAL_TABLET | Freq: Every day | ORAL | Status: DC
  Administered 2016-07-01 – 2016-07-02 (×2): 100 mg via ORAL
  Filled 2016-07-01 (×2): qty 1

## 2016-07-01 MED ORDER — ADULT MULTIVITAMIN W/MINERALS CH
1.0000 | ORAL_TABLET | Freq: Every day | ORAL | Status: DC
  Administered 2016-07-01 – 2016-07-02 (×2): 1 via ORAL
  Filled 2016-07-01 (×2): qty 1

## 2016-07-01 MED ORDER — HALOPERIDOL LACTATE 5 MG/ML IJ SOLN
5.0000 mg | Freq: Once | INTRAMUSCULAR | Status: AC
  Administered 2016-07-01: 5 mg via INTRAMUSCULAR
  Filled 2016-07-01: qty 1

## 2016-07-01 MED ORDER — THIAMINE HCL 100 MG/ML IJ SOLN: 100.0000 mg | Freq: Every day | INTRAMUSCULAR | Status: DC

## 2016-07-01 NOTE — Progress Notes (Signed)
Pt  wandered out of back door  thinking she was going home.  She also yanked her IV out of her hand. She now has requested pain med PO.  Dr notified.  Son at bedside.

## 2016-07-01 NOTE — Progress Notes (Signed)
Attempted x 4 times to obtain IV assess w/o success. AC notified and made aware.

## 2016-07-01 NOTE — Progress Notes (Signed)
PROGRESS NOTE    [name]  [id number] DOB: 1946/07/19 DOA: 06/28/2016 PCP: Claretta Fraise, MD gastroenterologist-Dr. Gala Romney   Brief Narrative:  Patient is a 70 year old woman with a history of HTN, COPD, chronic anxiety, and hypothyroidism. She also had a colonoscopy in January 2017 which revealed circumferential narrowing of the small bowel suspicious for Crohn's disease. She is followed by Dr. Hazle Nordmann. who presented to the ED on 06/28/2016 with a chief complaint of abdominal pain/nausea/vomiting. She reported seeing her PCP for her pain and was prescribed Cipro and Flagyl for presumed acute diverticulitis. Her symptoms did not abate. In the ED, she was afebrile and hemodynamically stable. Her white blood cell count was elevated at 18. CT of her abdomen and pelvis revealed small bowel obstruction at the level of the terminal ileum where there was matting of the small bowel loops, suggesting adhesion; small to moderate sliding hiatal hernia with reflux, and prominent stool in the rectum. She was admitted for further evaluation and management.  Assessment & Plan:   Principal Problem:   SBO (small bowel obstruction) (HCC) Active Problems:   Hyperlipidemia   B12 deficiency   Hypothyroidism   Chronic kidney disease (CKD), stage II (mild)   Iron deficiency anemia due to chronic blood loss   Acute renal failure (HCC)   Sinus tachycardia (HCC)   Acute confusional state   1. Small bowel obstruction. The patient was made to be nothing by mouth. NG tube was not inserted as she had no further persistent vomiting. Antiemetics were ordered as needed. Maintenance IV fluids were started. General surgery was consulted. Dr. Arnoldo Morale assess the patient and agrees that she had small bowel obstruction secondary to adhesive disease, but also possibly secondary to enteritis. Dr. Arnoldo Morale ordered Cipro to cover enteritis empirically. Milk of molasses enema was also ordered with good results. He did not  recommend surgical intervention at this time. -Diet has been advanced to solid foods. She has tolerated full liquids.  Query enteritis. Dr. Arnoldo Morale started the patient on Cipro. Her white blood cell count was elevated at 18.0 on admission, but she was afebrile. -Her white blood cell count has normalized. She remains afebrile. In light of her confusion, will discontinue Cipro.  Query Crohn's disease. Patient has seen Dr. Gala Romney in the recent past for colonoscopy. Dr. Arnoldo Morale has ordered a GI consult for further clarification.  Acute confusional state/encephalopathy. The patient apparently became agitated and pulled out her IV overnight-7/21-7/22/17. She wandered outside in the hallway thinking that she was going home. She has been anxious this morning and refusing medications. -Her son was called and he is in the room. He is wondering if she is withdrawing from alcohol. The patient admitted drinking wine and whiskey in the past, but over the past month, she adamantly denies drinking hardly any. -It is unclear what caused her confusion; alcohol withdrawal syndrome cannot be ruled out. -We'll start CIWA protocol with Ativan and vitamin therapy. Will hold Cipro in case it could be contributing to her confusional state. -Patient was calmed by her son and the nursing staff. Otherwise, she became oriented to herself, her son, and hospital. Cranial nerves are intact. -We'll order TSH and B12 level.  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 Her recent creatinine appears to range from 1.4-1.65, indicating underlying chronic kidney disease.  -She was started on vigorous IV fluids as the increase in her creatinine was thought to be secondary to prerenal azotemia from volume  depletion and also in the setting of ARB therapy. ARB was held temporarily. Her creatinine has improved.  Hypertension. Patient is treated with valsartan  chronically. Her blood pressure was normotensive, but has increased progressively.  -Valsartan was restarted, but it was held due to acute on chronic renal failure. -Due to her elevated blood pressures and mild tachycardia,  low-dose Toprol-XL was added, but subsequently discontinued due to better blood pressures. -Valsartan was restarted at a lower dose when her renal function improved.   Hypothyroidism. Patient was continued on Synthroid. Her TSH is low at 0.152. She may have iatrogenic hyperthyroidism. -We'll hold the Synthroid and order a free T4 free T3 for further evaluation. Open (of note, it appeared that her TSH was low and her free T4 was elevated one month ago).  -Free T4 was within normal limits at 1.1.  Synthroid was restarted.  Chronic anxiety. Patient was restarted on Cymbalta. Ativan added when necessary per CIWA protocol.  Hyperglycemia. Patient's venous glucose was elevated, likely from dextrose in the  IV fluids. It normalized when dextrose was discontinued.     DVT prophylaxis: Subcutaneous heparin Code Status: Full code Family Communication: Discussed with son Disposition Plan: Discharge with clinically appropriate, likely in a few days.   Consultants:   Gen. surgery  Procedures:   None  Antimicrobials:  Cipro 06/29/16>> 07/01/16   Subjective: The patient apparently became agitated and pulled out her IV overnight-7/21-7/22/17. She wandered outside in the hallway thinking that she was going home. She has been anxious this morning and initially refusing medications. Her son was called and he is in the room. He is wondering if she is withdrawing from alcohol which she has done from a previous hospitalization. The patient admitted drinking wine and whiskey in the past, but over the past month, she adamantly denies drinking hardly any.  Objective: Filed Vitals:   06/30/16 0945 06/30/16 1408 06/30/16 2214 07/01/16 0552  BP: 158/94 126/68 127/72 137/59    Pulse: 96 96 79 85  Temp:  98.4 F (36.9 C) 97.8 F (36.6 C) 99.1 F (37.3 C)  TempSrc:  Oral Oral Oral  Resp:  18 18 18   Height:      Weight:      SpO2:  96% 98% 98%    Intake/Output Summary (Last 24 hours) at 07/01/16 1058 Last data filed at 07/01/16 0553  Gross per 24 hour  Intake    240 ml  Output      0 ml  Net    240 ml   Filed Weights   06/28/16 1648 06/29/16 0010  Weight: 72.576 kg (160 lb) 65.8 kg (145 lb 1 oz)    Examination:  General exam:70 year old Caucasian woman who is sitting in the chair; anxious and occasionally tearful.  Respiratory system: Clear to auscultation. Respiratory effort normal. Cardiovascular system: S1, S2.  No JVD, murmurs, rubs, gallops or clicks. No pedal edema. Gastrointestinal system: Positive bowel sounds, soft, nontender, nondistended. Central nervous system: Alert and oriented. No focal neurological deficits. Cranial nerves II through XII are intact. Extremities: Symmetric 5 x 5 power. Skin: No rashes, lesions or ulcers Psychiatry: Anxious and slightly confused and had to be redirected to month and place. She is oriented to her son herself and her physicians. She is not tremulous, but agitated at the questioning about her recent alcohol intake. She denies drinking heavily over the past months.     Data Reviewed: I have personally reviewed following labs and imaging studies  CBC:  Recent  Labs Lab 06/28/16 1704 06/29/16 0756 06/30/16 0603 07/01/16 0500  WBC 18.0* 5.8 4.6 5.6  HGB 12.3 9.5* 9.5* 9.3*  HCT 37.3 29.5* 29.2* 29.3*  MCV 84.0 85.3 84.6 85.9  PLT 378 218 195 [id number]   Basic Metabolic Panel:  Recent Labs Lab 06/28/16 1704 06/29/16 0756 06/30/16 0603 07/01/16 0515  NA 134* 136 139 137  K 4.8 4.4 4.1 4.1  CL 101 107 111 108  CO2 21* 21* 22 22  GLUCOSE 175* 127* 96 89  BUN 30* 32* 16 10  CREATININE 1.84* 1.84* 1.11* 1.09*  CALCIUM 9.3 7.7* 8.4* 8.5*   GFR: Estimated Creatinine Clearance: 51.3 mL/min (by  C-G formula based on Cr of 1.09). Liver Function Tests:  Recent Labs Lab 06/28/16 1704  AST 17  ALT 11*  ALKPHOS 102  BILITOT 0.8  PROT 8.2*  ALBUMIN 4.7    Recent Labs Lab 06/28/16 1704  LIPASE 17   No results for input(s): AMMONIA in the last 168 hours. Coagulation Profile: No results for input(s): INR, PROTIME in the last 168 hours. Cardiac Enzymes: No results for input(s): CKTOTAL, CKMB, CKMBINDEX, TROPONINI in the last 168 hours. BNP (last 3 results) No results for input(s): PROBNP in the last 8760 hours. HbA1C: No results for input(s): HGBA1C in the last 72 hours. CBG: No results for input(s): GLUCAP in the last 168 hours. Lipid Profile: No results for input(s): CHOL, HDL, LDLCALC, TRIG, CHOLHDL, LDLDIRECT in the last 72 hours. Thyroid Function Tests:  Recent Labs  06/28/16 1728 06/30/16 0603  TSH 0.152*  --   FREET4  --  1.10   Anemia Panel:  Recent Labs  06/28/16 1728  VITAMINB12 230   Sepsis Labs:  Recent Labs Lab 06/28/16 1704 06/28/16 2026  LATICACIDVEN 2.2* 1.5    No results found for this or any previous visit (from the past 240 hour(s)).       Radiology Studies: No results found.      Scheduled Meds: . acetaminophen  500 mg Oral BID  . aspirin EC  81 mg Oral Daily  . ciprofloxacin  400 mg Intravenous Q12H  . DULoxetine  60 mg Oral Daily  . folic acid  1 mg Oral Daily  . heparin  5,000 Units Subcutaneous Q8H  . irbesartan  75 mg Oral Daily  . levothyroxine  112 mcg Oral QAC breakfast  . multivitamin with minerals  1 tablet Oral Daily  . thiamine  100 mg Oral Daily   Or  . thiamine  100 mg Intravenous Daily   Continuous Infusions: . sodium chloride 50 mL/hr at 06/30/16 1057     LOS: 3 days    Time spent: 35-40 minutes    Rexene Alberts, MD [facility] Pager [phone]  If 7PM-7AM, please contact night-coverage [email or web address] Password [facility] 07/01/2016, 10:58 AM

## 2016-07-01 NOTE — Progress Notes (Signed)
Patient's son is concerned about patient's agitation. Dr. Caryn Section notified. New order for Haldol 5mg  IM one time dose.

## 2016-07-01 NOTE — Consult Note (Signed)
Referring Provider: Rexene Alberts, MD Primary Care Physician:  Claretta Fraise, MD Primary Gastroenterologist:  Dr. Gala Romney  Reason for Consultation:    Small bowel obstruction. 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  HPI:   History is provided by the patient. Patient is 70 year old Caucasian female who was in usual state of felt until 5 days prior to admission when she developed left low quadrant abdominal pain. She was seen by Ms. Chevis Pretty FNP on 06/23/2016 and felt to have sigmoid diverticulitis. She was begun on Cipro and metronidazole. However she developed nausea and vomiting and finally ended up in emergency room at [facility] on 06/28/2016. On evaluation she was felt to have dilated loops of small bowel and obstruction was felt to be at the level of ileocecal valve. There was matting of small bowel loops in this location and therefore adhesive disease was suspected. Patient was seen in consultation by Dr. Aviva Signs and she has responded to medical therapy. She is tolerating diet. She has history of proximal colonic and ileocecal valve ulceration GI consultation was requested. Patient denies abdominal pain or nausea. She also denies fever or chills. She states her stools are black because she is on iron. She is on low-dose aspirin and celecoxib and she does not take any other OTC NSAIDs.     Past Medical History  Diagnosis Date  . Hypertension   . Menopause   . Hypothyroid   . Mini stroke ([place])   . Hyperlipidemia   . B12 deficiency   . Shortness of breath   . Headache(784.0)   . Anxiety   . Pneumonia   . Anemia   . Depression   . Stroke [facility]) 2010    no deficits  . Alcoholism ([place]) 01/09/2012  . Hyponatremia 01/09/2012  . COPD (chronic obstructive pulmonary disease) ([place])   . Orthostatic syncope 10/02/2012  . Carotid artery narrowing 10/03/2012    On the right.  . Chronic kidney disease (CKD), stage II (mild)     Past  Surgical History  Procedure Laterality Date  . Appendectomy    . Tonsillectomy    . Cataract extraction w/phaco  06/20/2012    Procedure: CATARACT EXTRACTION PHACO AND INTRAOCULAR LENS PLACEMENT (IOC);  Surgeon: Tonny Branch, MD;  Location: [facility]S;  Service: Ophthalmology;  Laterality: Right;  CDE:10.66  . Cataract extraction w/phaco  07/11/2012    Procedure: CATARACT EXTRACTION PHACO AND INTRAOCULAR LENS PLACEMENT (IOC);  Surgeon: Tonny Branch, MD;  Location: [facility];  Service: Ophthalmology;  Laterality: Left;  CDE: 12.69  . Colonoscopy N/A 12/17/2015    RMR: Marked circumferential ulceration of the ascending colon/Ileocecal valve ulceration with biopsy most consistent with NSAID injury versus inflammatory bowel disease, pancolonoc diverticulosis redundant colon  . Esophagogastroduodenoscopy N/A 12/17/2015    [id number] gastric polyp, biopsy benign  . Agile capsule N/A 02/22/2016    dummy capsule remained in the distal ileum    Prior to Admission medications   Medication Sig Start Date End Date Taking? Authorizing Provider  acetaminophen (TYLENOL) 500 MG tablet Take 1 tablet (500 mg total) by mouth every 6 (six) hours as needed for moderate pain. Pain Patient taking differently: Take 500 mg by mouth 2 (two) times daily. Pain 07/12/15  Yes Claretta Fraise, MD  aspirin EC 81 MG tablet Take 81 mg by mouth daily.   Yes Historical Provider, MD  budesonide (ENTOCORT EC) 3 MG 24 hr capsule Take 3 capsules (9 mg total) by mouth daily. 03/15/16  Yes Mahala Menghini, PA-C  celecoxib (CELEBREX) 200 MG capsule Take 1 capsule (200 mg total) by mouth daily. With food 05/12/16  Yes Claretta Fraise, MD  ciprofloxacin (CIPRO) 500 MG tablet Take 1 tablet (500 mg total) by mouth 2 (two) times daily. 06/23/16  Yes Mary-Margaret Hassell Done, FNP  cyclobenzaprine (FLEXERIL) 10 MG tablet Take 1 tablet (10 mg total) by mouth 3 (three) times daily as needed for muscle spasms. Patient taking differently: Take 10 mg by mouth 3 (three) times  daily.  03/07/16  Yes Claretta Fraise, MD  DULoxetine (CYMBALTA) 60 MG capsule TAKE 1 CAPSULE DAILY 05/12/16  Yes Claretta Fraise, MD  Ferrous Fumarate (HEMOCYTE - 106 MG FE) 324 (106 Fe) MG TABS tablet Take 1 tablet (106 mg of iron total) by mouth 2 (two) times daily. 05/12/16  Yes Claretta Fraise, MD  folic acid (FOLVITE) 1 MG tablet Take 1 tablet (1 mg total) by mouth daily. 02/14/16  Yes Claretta Fraise, MD  levothyroxine (SYNTHROID, LEVOTHROID) 112 MCG tablet Take 1 tablet (112 mcg total) by mouth daily. 05/15/16  Yes Claretta Fraise, MD  metroNIDAZOLE (FLAGYL) 500 MG tablet Take 1 tablet (500 mg total) by mouth 2 (two) times daily. 06/23/16  Yes Mary-Margaret Hassell Done, FNP  thiamine (VITAMIN B-1) 100 MG tablet Take 1 tablet (100 mg total) by mouth daily. 04/20/16  Yes Claretta Fraise, MD  valsartan (DIOVAN) 160 MG tablet Take 2 tablets (320 mg total) by mouth daily. Patient taking differently: Take 160 mg by mouth daily.  02/14/16  Yes Claretta Fraise, MD    Current Facility-Administered Medications  Medication Dose Route Frequency Provider Last Rate Last Dose  . acetaminophen (TYLENOL) tablet 500 mg  500 mg Oral BID Orvan Falconer, MD   500 mg at 07/01/16 1000  . aspirin EC tablet 81 mg  81 mg Oral Daily Orvan Falconer, MD   81 mg at 07/01/16 1115  . DULoxetine (CYMBALTA) DR capsule 60 mg  60 mg Oral Daily Orvan Falconer, MD   60 mg at 06/30/16 0946  . folic acid (FOLVITE) tablet 1 mg  1 mg Oral Daily Rexene Alberts, MD   1 mg at 07/01/16 1114  . heparin injection 5,000 Units  5,000 Units Subcutaneous Q8H Orvan Falconer, MD   5,000 Units at 06/30/16 2334  . hydrALAZINE (APRESOLINE) injection 5 mg  5 mg Intravenous Q4H PRN Rexene Alberts, MD      . irbesartan (AVAPRO) tablet 75 mg  75 mg Oral Daily Rexene Alberts, MD   75 mg at 07/01/16 1115  . levothyroxine (SYNTHROID, LEVOTHROID) tablet 112 mcg  112 mcg Oral QAC breakfast Rexene Alberts, MD   112 mcg at 07/01/16 1114  . LORazepam (ATIVAN) tablet 1 mg  1 mg Oral Q6H PRN Rexene Alberts, MD   1 mg  at 07/01/16 0854   Or  . LORazepam (ATIVAN) injection 1 mg  1 mg Intravenous Q6H PRN Rexene Alberts, MD      . morphine 2 MG/ML injection 2 mg  2 mg Intravenous Q2H PRN Orvan Falconer, MD   2 mg at 07/01/16 0544  . multivitamin with minerals tablet 1 tablet  1 tablet Oral Daily Rexene Alberts, MD   1 tablet at 07/01/16 1114  . thiamine (VITAMIN B-1) tablet 100 mg  100 mg Oral Daily Rexene Alberts, MD   100 mg at 07/01/16 1114   Or  . thiamine (B-1) injection 100 mg  100 mg Intravenous Daily Rexene Alberts, MD  Allergies as of 06/28/2016 - Review Complete 06/28/2016  Allergen Reaction Noted  . Norvasc [amlodipine besylate] Hives 04/21/2011  . Procardia [nifedipine] Swelling 04/21/2011    Family History  Problem Relation Age of Onset  . GER disease Mother   . Hypertension Maternal Grandmother     Social History   Social History  . Marital Status: Widowed    Spouse Name: N/A  . Number of Children: N/A  . Years of Education: N/A   Occupational History  . Not on file.   Social History Main Topics  . Smoking status: Former Smoker -- 2.00 packs/day for 30 years    Types: Cigarettes    Quit date: 06/17/1992  . Smokeless tobacco: Not on file  . Alcohol Use: No  . Drug Use: No  . Sexual Activity: Not on file   Other Topics Concern  . Not on file   Social History Narrative    Review of Systems: See HPI, otherwise normal ROS  Physical Exam: Temp:  [97.8 F (36.6 C)-99.1 F (37.3 C)] 99.1 F (37.3 C) (07/22 0552) Pulse Rate:  [79-96] 85 (07/22 0552) Resp:  [18] 18 (07/22 0552) BP: (126-137)/(59-72) 137/59 mmHg (07/22 0552) SpO2:  [96 %-98 %] 98 % (07/22 0552) Last BM Date: 06/29/16 Patient is alert and in no acute distress. She prefers to keep her eyes closed but answers questions appropriately. Conjunctiva is pale. Sclerae nonicteric. Oropharyngeal mucosa is normal. No neck masses or thyromegaly noted. Cardiac exam with regular rhythm normal S1 and S2. No murmur or  gallop noted. Lungs are clear to auscultation. Abdomen is symmetrical. Bowel sounds are normal. On palpation abdomen is soft and nontender without organomegaly or masses. No peripheral edema or clubbing noted.    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 Obstruction was felt to be at the level of the ileocecal valve and etiology thought to be adhesive disease given CT findings. It is quite possible that she has developed stricture at the level of ileocecal valve given continued use of NSAIDs. If symptoms relapse she will need further evaluation. Patient must not take NSAIDs other than low-dose aspirin. Anemia without evidence of active GI bleed.  Recommendations;  Hemoccult 1. Recheck serum ferritin iron and TIBC(abnormal in January 2017). CRP and sedimentation rate. If these are elevated would consider MR enterography. Patient will need to refrain from NSAID use other than low-dose aspirin.   LOS: 3 days   Alexei Ey  07/01/2016, 12:13 PM

## 2016-07-01 NOTE — Progress Notes (Signed)
Pt removed IV during night.  MD aware.  Told to leave IV out at this time.

## 2016-07-01 NOTE — Progress Notes (Signed)
  Subjective: Patient is upset at sun about his girlfriend. Apparently she has been confused and belligerent overnight. She denies having a bowel movement over the last 24 hours, though she has been passing gas. No nausea or vomiting are noted.  Objective: Vital signs in last 24 hours: Temp:  [97.8 F (36.6 C)-99.1 F (37.3 C)] 99.1 F (37.3 C) (07/22 0552) Pulse Rate:  [79-96] 85 (07/22 0552) Resp:  [18] 18 (07/22 0552) BP: (126-158)/(59-94) 137/59 mmHg (07/22 0552) SpO2:  [96 %-98 %] 98 % (07/22 0552) Last BM Date: 06/29/16  Intake/Output from previous day: 07/21 0701 - 07/22 0700 In: 240 [P.O.:240] Out: -  Intake/Output this shift:    General appearance: combative GI: soft, non-tender; bowel sounds normal; no masses,  no organomegaly  Lab Results:   Recent Labs  06/30/16 0603 07/01/16 0500  WBC 4.6 5.6  HGB 9.5* 9.3*  HCT 29.2* 29.3*  PLT 195 241   BMET  Recent Labs  06/30/16 0603 07/01/16 0515  NA 139 137  K 4.1 4.1  CL 111 108  CO2 22 22  GLUCOSE 96 89  BUN 16 10  CREATININE 1.11* 1.09*  CALCIUM 8.4* 8.5*   PT/INR No results for input(s): LABPROT, INR in the last 72 hours.  Studies/Results: No results found.  Anti-infectives: Anti-infectives    Start     Dose/Rate Route Frequency Ordered Stop   06/29/16 0900  ciprofloxacin (CIPRO) IVPB 400 mg     400 mg 200 mL/hr over 60 Minutes Intravenous Every 12 hours 06/29/16 0824        Assessment/Plan: Impression: Partial small bowel obstruction, enteritis. Clinically seems to be stable. Plan: Awaiting input from GI. No need for surgical intervention.  LOS: 3 days    Notnamed Scholz A 07/01/2016

## 2016-07-02 DIAGNOSIS — I10 Essential (primary) hypertension: Secondary | ICD-10-CM

## 2016-07-02 LAB — SEDIMENTATION RATE: Sed Rate: 27 mm/hr — ABNORMAL HIGH (ref 0–22)

## 2016-07-02 LAB — TSH: TSH: 0.468 u[IU]/mL (ref 0.350–4.500)

## 2016-07-02 LAB — VITAMIN B12: Vitamin B-12: 188 pg/mL (ref 180–914)

## 2016-07-02 MED ORDER — CYCLOBENZAPRINE HCL 10 MG PO TABS: 10.0000 mg | ORAL_TABLET | Freq: Three times a day (TID) | ORAL | Status: DC | PRN

## 2016-07-02 MED ORDER — ACETAMINOPHEN 500 MG PO TABS: 500.0000 mg | ORAL_TABLET | Freq: Two times a day (BID) | ORAL | Status: DC

## 2016-07-02 MED ORDER — VALSARTAN 160 MG PO TABS: 160.0000 mg | ORAL_TABLET | Freq: Every day | ORAL | Status: DC

## 2016-07-02 NOTE — Progress Notes (Signed)
  Subjective:  Patient feels much better. She states she ate all of her breakfast and more than 50% for lunch. She denies nausea vomiting or abdominal pain. She is passing flatus but has not had a BM since last seen. She wants to know why she was confused  Objective: Blood pressure (!) 147/68, pulse 88, temperature 98.8 F (37.1 C), temperature source Oral, resp. rate 18, height 5\' 9"  (1.753 m), weight 145 lb 1 oz (65.8 kg), last menstrual period 08/21/2013, SpO2 93 %. Patient is alert and in no acute distress. Abdomen is full. Bowel sounds are normal. On palpation abdomen is soft and nontender without organomegaly or masses. No LE edema or clubbing noted.  Labs/studies Results:   Recent Labs  06/30/16 0603 07/01/16 0500  WBC 4.6 5.6  HGB 9.5* 9.3*  HCT 29.2* 29.3*  PLT 195 241    BMET   Recent Labs  06/30/16 0603 07/01/16 0515  NA 139 137  K 4.1 4.1  CL 111 108  CO2 22 22  GLUCOSE 96 89  BUN 16 10  CREATININE 1.11* 1.09*  CALCIUM 8.4* 8.5*   Sedimentation rate is 27(0-22). CRP 0.6 Serum iron 38, TIBC 343 and saturation 11% Ferritin 11  Serum iron and saturation were 10 and 2% on 12/15/2015 Serum ferritin was 2 on 12/15/2015.   Assessment:  #1. Small bowel obstruction. It is resolved. Etiology felt to be due to adhesive disease given CT findings but she could also have small bowel obstruction secondary to stenosis/inflammation and ileocecal valve where she was documented to have circumferential ulcer on colonoscopy but 6 months ago etiology felt to be NSAID use. Patient has been on celecoxib until this admission. Reassuring to know that CRP is normal and sedimentation rate only mildly elevated. #2. Iron deficiency anemia. Stool guaiac is pending. Iron studies have improved significantly since they were checked in January 2017. #3. Confusional state. She is not confused anymore. Alcohol withdrawal raised as a possibility based on history provided her son to Dr.  Caryn Section.  Recommendations.:  Patient advised not to take celecoxib or other NSAIDs. She should bring all of her medications her next appointment with Dr. Buford Dresser in 2 weeks.

## 2016-07-02 NOTE — Progress Notes (Signed)
Pt IV and telemetry removed, tolerated well. Reviewed discharge information with pt and family.  Answered questions at this time.

## 2016-07-02 NOTE — Discharge Summary (Signed)
Physician Discharge Summary  [name] [id number] DOB: [birth year] DOA: 06/28/2016  PCP: Claretta Fraise, MD  Admit date: 06/28/2016 Discharge date: 07/02/2016  Time spent: Greater than 30 minutes  Recommendations for Outpatient Follow-up:  1. Patient was instructed to stop drinking indefinitely. 2. She was instructed to stop Celebrex and all other NSAIDs.  3.    Discharge Diagnoses:  1. Small bowel obstruction, treated medically. 2. Suspicion for enteritis. 3. Acute confusional state/encephalopathy, likely from alcohol withdrawal syndrome. 4. 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 5. Hypertension. 6. Hypothyroidism. 7. Chronic anxiety. 8. Hyperglycemia secondary to dextrose and IV fluids. 9. History of vitamin B12 and iron deficiency. 10. Alcohol abuse, but patient denied this.   Discharge Condition: Improved.  Diet recommendation: Soft and heart healthy.  Filed Weights   06/28/16 1648 06/29/16 0010  Weight: 72.6 kg (160 lb) 65.8 kg (145 lb 1 oz)    History of present illness:  Patient is a 70 year old woman with a history of HTN, COPD, chronic anxiety, and hypothyroidism. She also had a colonoscopy in January 2017 which revealed circumferential narrowing of the small bowel suspicious for Crohn's disease. She is followed by Dr. Hazle Nordmann. who presented to the ED on 06/28/2016 with a chief complaint of abdominal pain/nausea/vomiting. She reported seeing her PCP for her pain and was prescribed Cipro and Flagyl for presumed acute diverticulitis. Her symptoms did not abate. In the ED, she was afebrile and hemodynamically stable. Her white blood cell count was elevated at 18. CT of her abdomen and pelvis revealed small bowel obstruction at the level of the terminal ileum where there was matting of the small bowel loops, suggesting adhesion; small to moderate sliding hiatal hernia with reflux, and prominent stool in the rectum. She was admitted  for further evaluation and management.  Hospital Course:  1. Small bowel obstruction. The patient was made to be nothing by mouth. NG tube was not inserted as she had no further persistent vomiting. Antiemetics were ordered as needed. Maintenance IV fluids were started. General surgery was consulted. Dr. Arnoldo Morale assessed the patient and stated that she had small bowel obstruction secondary to adhesive disease, but also possibly secondary to enteritis. Dr. Arnoldo Morale ordered Cipro to cover enteritis empirically. Milk of molasses enema was also ordered with good results. He did not believe surgical intervention was needed. -Her diet was slowly advanced which she tolerated well.  Query enteritis. Dr. Arnoldo Morale started the patient on Cipro. Her white blood cell count was elevated at 18.0 on admission, but she was afebrile. Her white blood cell count has normalized rather quickly and she remained afebrile. In light of her confusional state, Cipro was discontinued. Gastroenterologist, Dr. Laural Golden was consulted. He reviewed her colonoscopy from 60months ago where she was found to have a circumferential ulcer around the ileocecal valve which was felt to be secondary to NSAID use. He ordered a CRP and sedimentation rate. The CRP was normal and the sedimentation rate was only mildly elevated. He recommended that the patient not take Celebrex or any other NSAIDs. This was explained to the patient.  Acute confusional state/encephalopathy. The patient apparently became agitated and pulled out her several IVs and started wandering around in the hallway. Her son was called in to help calm the patient. He reported a history of alcohol withdrawal syndrome during the previous hospitalization. Therefore, see CIWA protocol was ordered. Patient reported a history of drinking wine and whiskey on a daily basis, but she stated she hardly had  any over the past month. Her son stated that this was likely not the case. Vitamin B12 and  TSH were ordered for evaluation. Her TSH was within normal limits, but her vitamin B12 level was borderline low which she has chronically. -At the time of discharge, the patient was alert and oriented and not agitated.  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 Her recent creatinine appears to range from 1.4-1.65, indicating underlying chronic kidney disease.  -She was started on vigorous IV fluids as the increase in her creatinine was thought to be secondary to prerenal azotemia from volume depletion and also in the setting of ARB therapy. ARB was held temporarily. Her creatinine has improved.  Hypertension. Patient is treated with valsartan chronically. Her blood pressure was normotensive, but increased progressively.  -Valsartan was restarted, but it was held initially due to acute on chronic renal failure. Low-dose Toprol-XL was added, but was subsequently discontinued when her renal function improved and her blood pressures improved. She was restarted on valsartan.  Hypothyroidism. Patient was continued on Synthroid. Her TSH is low at 0.152, but was repeated at 0.468. Free T4 was within normal limits at 1.10.  Chronic anxiety. Patient was restarted on Cymbalta. Ativan was added when necessary per CIWA protocol.  Hyperglycemia. Patient's venous glucose was elevated, likely from dextrose in the  IV fluids. It normalized when dextrose was discontinued.  Procedures:  None  Consultations:  Gen. surgery  Discharge Exam: Vitals:   07/02/16 0540 07/02/16 1300  BP: (!) 147/68 (!) 156/71  Pulse: 88 76  Resp: 18 18  Temp: 98.8 F (37.1 C) 98.7 F (37.1 C)    General: 70 year old Caucasian woman who looks much better. She is not anxious or agitated nor she confused. Cardiovascular: S1, S2, no murmurs rubs or gauze. Respiratory: Clear to auscultation bilaterally. Abdomen: Positive bowel sounds, soft,  nontender, nondistended.  Discharge Instructions   Discharge Instructions    Diet - low sodium heart healthy    Complete by:  As directed   Discharge instructions    Complete by:  As directed   DO NOT DRINK ALCOHOL. AVOID TAKING NSAIDS LIKE CELEBREX, MOTRIN, ALEVE, BC POWDERS AND THE LIKE.   Increase activity slowly    Complete by:  As directed     Current Discharge Medication List    CONTINUE these medications which have CHANGED   Details  acetaminophen (TYLENOL) 500 MG tablet Take 1 tablet (500 mg total) by mouth 2 (two) times daily. Pain    cyclobenzaprine (FLEXERIL) 10 MG tablet Take 1 tablet (10 mg total) by mouth 3 (three) times daily as needed.    valsartan (DIOVAN) 160 MG tablet Take 1 tablet (160 mg total) by mouth daily.   Associated Diagnoses: Essential hypertension      CONTINUE these medications which have NOT CHANGED   Details  aspirin EC 81 MG tablet Take 81 mg by mouth daily.    DULoxetine (CYMBALTA) 60 MG capsule TAKE 1 CAPSULE DAILY Qty: 90 capsule, Refills: 0    Ferrous Fumarate (HEMOCYTE - 106 MG FE) 324 (106 Fe) MG TABS tablet Take 1 tablet (106 mg of iron total) by mouth 2 (two) times daily. Qty: 60 tablet, Refills: 5    folic acid (FOLVITE) 1 MG tablet Take 1 tablet (1 mg total) by mouth daily. Qty: 90 tablet, Refills: 1   Associated Diagnoses: Alcoholism ([place])    levothyroxine (SYNTHROID, LEVOTHROID) 112 MCG tablet Take 1 tablet (  112 mcg total) by mouth daily. Qty: 90 tablet, Refills: 1    thiamine (VITAMIN B-1) 100 MG tablet Take 1 tablet (100 mg total) by mouth daily. Qty: 90 tablet, Refills: 3   Associated Diagnoses: Alcoholism ([place])      STOP taking these medications     budesonide (ENTOCORT EC) 3 MG 24 hr capsule      celecoxib (CELEBREX) 200 MG capsule      ciprofloxacin (CIPRO) 500 MG tablet      metroNIDAZOLE (FLAGYL) 500 MG tablet        Allergies  Allergen Reactions  . Norvasc [Amlodipine Besylate] Hives  . Procardia  [Nifedipine] Swelling    Edema     Follow-up Information    Manus Rudd, MD .   Specialty:  Gastroenterology Why:  FOLLOW UP AS SCHEDULED. Contact information: [address] [place] [address] [phone]            The results of significant diagnostics from this hospitalization (including imaging, microbiology, ancillary and laboratory) are listed below for reference.    Significant Diagnostic Studies: Ct Abdomen Pelvis Wo Contrast  Result Date: 06/28/2016 CLINICAL DATA:  Worsening abdominal pain. EXAM: CT ABDOMEN AND PELVIS WITHOUT CONTRAST TECHNIQUE: Multidetector CT imaging of the abdomen and pelvis was performed following the standard protocol without IV contrast. COMPARISON:  Abdomen radiograph dated 06/23/2016. Abdomen and pelvis CT dated 02/04/2016. FINDINGS: Lower chest: Minimal patchy atelectasis or scarring in the left lower lobe anteriorly. Hepatobiliary: Distended gallbladder without abnormal dilatation, wall thickening or visible gallstones. Normal non contrasted appearance of the liver. Pancreas: No mass or inflammatory process identified on this un-enhanced exam. Spleen: Within normal limits in size and appearance. Adrenals/Urinary Tract: Normal appearing adrenal glands, kidneys, ureters and urinary bladder. No calculi or hydronephrosis. No visible mass. Stomach/Bowel: Dilated, fluid-filled small bowel loops throughout the abdomen pelvis to the level of the terminal ileum. There is some matting of the small bowel loops at that location with some fecalization of the bowel contents. Normal caliber colon. Prominent stool in the rectum. No evidence of appendicitis. Small to moderate-sized sliding hiatal hernia with refluxed contrast in the distal esophagus. Vascular/Lymphatic: Atheromatous arterial calcifications, including the abdominal aorta. No enlarged lymph nodes. Reproductive: No mass or other significant abnormality. Other: None. Musculoskeletal: Lumbar spine  degenerative changes at multiple levels. Mild anterior wedge deformities involving the L1, L2 and L3 vertebral bodies with minimal bony retropulsion. Mild diffuse compression deformity of the L5 vertebral body. No acute fracture lines. IMPRESSION: 1. Small bowel obstruction to the level of the terminal ileum were there is matting of the small bowel loops, suggesting adhesion formation. 2. Small to moderate-sized sliding hiatal hernia with gastroesophageal reflux. 3. Prominent stool in the rectum. 4. Aortic atherosclerosis. Electronically Signed   By: Claudie Revering M.D.   On: 06/28/2016 21:35   Dg Chest 2 View  Result Date: 06/28/2016 CLINICAL DATA:  70 y/o  F; vomiting and abdominal pain. EXAM: CHEST  2 VIEW COMPARISON:  None. FINDINGS: The heart size and mediastinal contours are within normal limits. Both lungs are clear. The visualized skeletal structures are unremarkable. Prominent loops of small bowel in the left upper quadrant with fluid levels may represent obstruction or ileus. Stable biapical scarring. IMPRESSION: No active cardiopulmonary disease. Prominent loops of small bowel in the left upper quadrant with fluid levels may represent obstruction or ileus. Electronically Signed   By: Kristine Garbe M.D.   On: 06/28/2016 19:53   Dg Abd 1 View  Result Date: 06/23/2016 CLINICAL DATA:  Left lower quadrant abdominal pain. EXAM: ABDOMEN - 1 VIEW COMPARISON:  02/23/2016 FINDINGS: Normal bowel gas pattern. No obstruction. Mild colonic stool burden. No evidence of renal or ureteral stones. Soft tissues are unremarkable. IMPRESSION: 1. No acute findings.  No evidence of bowel obstruction. 2. Mild colonic stool burden. Electronically Signed   By: Lajean Manes M.D.   On: 06/23/2016 15:28    Microbiology: No results found for this or any previous visit (from the past 240 hour(s)).   Labs: Basic Metabolic Panel:  Recent Labs Lab 06/28/16 1704 06/29/16 0756 06/30/16 0603 07/01/16 0515  NA  134* 136 139 137  K 4.8 4.4 4.1 4.1  CL 101 107 111 108  CO2 21* 21* 22 22  GLUCOSE 175* 127* 96 89  BUN 30* 32* 16 10  CREATININE 1.84* 1.84* 1.11* 1.09*  CALCIUM 9.3 7.7* 8.4* 8.5*   Liver Function Tests:  Recent Labs Lab 06/28/16 1704  AST 17  ALT 11*  ALKPHOS 102  BILITOT 0.8  PROT 8.2*  ALBUMIN 4.7    Recent Labs Lab 06/28/16 1704  LIPASE 17   No results for input(s): AMMONIA in the last 168 hours. CBC:  Recent Labs Lab 06/28/16 1704 06/29/16 0756 06/30/16 0603 07/01/16 0500  WBC 18.0* 5.8 4.6 5.6  HGB 12.3 9.5* 9.5* 9.3*  HCT 37.3 29.5* 29.2* 29.3*  MCV 84.0 85.3 84.6 85.9  PLT 378 218 195 241   Cardiac Enzymes: No results for input(s): CKTOTAL, CKMB, CKMBINDEX, TROPONINI in the last 168 hours. BNP: BNP (last 3 results) No results for input(s): BNP in the last 8760 hours.  ProBNP (last 3 results) No results for input(s): PROBNP in the last 8760 hours.  CBG: No results for input(s): GLUCAP in the last 168 hours.     Signed:  Raquel Racey MD.  [facility] 07/02/2016, 3:46 PM

## 2016-07-03 ENCOUNTER — Encounter (HOSPITAL_COMMUNITY): Payer: Self-pay | Admitting: Internal Medicine

## 2016-07-07 ENCOUNTER — Other Ambulatory Visit: Payer: Self-pay | Admitting: Family Medicine

## 2016-07-22 DIAGNOSIS — M25562 Pain in left knee: Secondary | ICD-10-CM | POA: Diagnosis not present

## 2016-08-02 DIAGNOSIS — S83282A Other tear of lateral meniscus, current injury, left knee, initial encounter: Secondary | ICD-10-CM | POA: Diagnosis not present

## 2016-08-02 DIAGNOSIS — M94262 Chondromalacia, left knee: Secondary | ICD-10-CM | POA: Diagnosis not present

## 2016-08-02 DIAGNOSIS — M2242 Chondromalacia patellae, left knee: Secondary | ICD-10-CM | POA: Diagnosis not present

## 2016-08-02 DIAGNOSIS — S83272A Complex tear of lateral meniscus, current injury, left knee, initial encounter: Secondary | ICD-10-CM | POA: Diagnosis not present

## 2016-08-15 ENCOUNTER — Ambulatory Visit (INDEPENDENT_AMBULATORY_CARE_PROVIDER_SITE_OTHER): Payer: Medicare Other | Admitting: Family Medicine

## 2016-08-15 ENCOUNTER — Encounter: Payer: Self-pay | Admitting: Family Medicine

## 2016-08-15 VITALS — BP 93/66 | HR 101 | Temp 97.0°F | Ht 69.0 in | Wt 161.1 lb

## 2016-08-15 DIAGNOSIS — M545 Low back pain, unspecified: Secondary | ICD-10-CM

## 2016-08-15 DIAGNOSIS — I10 Essential (primary) hypertension: Secondary | ICD-10-CM | POA: Diagnosis not present

## 2016-08-15 DIAGNOSIS — E785 Hyperlipidemia, unspecified: Secondary | ICD-10-CM

## 2016-08-15 DIAGNOSIS — E039 Hypothyroidism, unspecified: Secondary | ICD-10-CM | POA: Diagnosis not present

## 2016-08-15 MED ORDER — DULOXETINE HCL 60 MG PO CPEP: 60.0000 mg | ORAL_CAPSULE | Freq: Every day | ORAL | 1 refills | Status: DC

## 2016-08-15 MED ORDER — CELECOXIB 200 MG PO CAPS: 200.0000 mg | ORAL_CAPSULE | Freq: Every day | ORAL | 1 refills | Status: DC

## 2016-08-15 MED ORDER — CELECOXIB 200 MG PO CAPS: 200.0000 mg | ORAL_CAPSULE | Freq: Every day | ORAL | 5 refills | Status: DC

## 2016-08-15 MED ORDER — CYCLOBENZAPRINE HCL 10 MG PO TABS: 10.0000 mg | ORAL_TABLET | Freq: Three times a day (TID) | ORAL | 0 refills | Status: DC | PRN

## 2016-08-15 MED ORDER — LEVOTHYROXINE SODIUM 112 MCG PO TABS: 112.0000 ug | ORAL_TABLET | Freq: Every day | ORAL | 1 refills | Status: DC

## 2016-08-15 MED ORDER — VALSARTAN 160 MG PO TABS: 160.0000 mg | ORAL_TABLET | Freq: Every day | ORAL | 1 refills | Status: DC

## 2016-08-15 NOTE — Progress Notes (Signed)
Subjective:  Patient ID: [name], female    DOB: 1946/07/30  Age: 70 y.o. MRN: [id number]  CC: 3 month follow up (pt here today to follow up on chronic medical conditions-hypertension, no other concerns voiced)   HPI [name] presents for  follow-up of hypertension. Patient has no history of headache chest pain or shortness of breath or recent cough. Patient also denies symptoms of TIA such as numbness weakness lateralizing. Patient checks  blood pressure at home and has not had any elevated readings recently. Patient denies side effects from his medication. States taking it regularly.  Patient also  in for follow-up of elevated cholesterol. Doing well without complaints on current medication. Denies side effects of statin including myalgia and arthralgia and nausea. Also in today for liver function testing. Currently no chest pain, shortness of breath or other cardiovascular related symptoms noted.  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   History [name] has a past medical history of Alcoholism ([place]) (01/09/2012); Anemia; Anxiety; B12 deficiency; Carotid artery narrowing (10/03/2012); Chronic kidney disease (CKD), stage II (mild); COPD (chronic obstructive pulmonary disease) ([place]); Depression; Headache(784.0); Hyperlipidemia; Hypertension; Hyponatremia (01/09/2012); Hypothyroid; Menopause; Mini stroke ([place]); Orthostatic syncope (10/02/2012); Pneumonia; SBO (small bowel obstruction) ([place]) (06/28/2016); Shortness of breath; and Stroke ([place]) (2010).   She has a past surgical history that includes Appendectomy; Tonsillectomy; Cataract extraction w/PHACO (06/20/2012); Cataract extraction w/PHACO (07/11/2012); Colonoscopy (N/A, 12/17/2015); Esophagogastroduodenoscopy (N/A, 12/17/2015); and Agile capsule (N/A, 02/22/2016).   Her family history includes GER disease in her mother; Hypertension in her maternal grandmother.She reports that she quit smoking about 24 years ago. Her smoking use  included Cigarettes. She has a 60.00 pack-year smoking history. She has never used smokeless tobacco. She reports that she does not drink alcohol or use drugs.  Current Outpatient Prescriptions on File Prior to Visit  Medication Sig Dispense Refill  . acetaminophen (TYLENOL) 500 MG tablet Take 1 tablet (500 mg total) by mouth 2 (two) times daily. Pain    . aspirin EC 81 MG tablet Take 81 mg by mouth daily.    . Ferrous Fumarate (HEMOCYTE - 106 MG FE) 324 (106 Fe) MG TABS tablet Take 1 tablet (106 mg of iron total) by mouth 2 (two) times daily. 60 tablet 5  . folic acid (FOLVITE) 1 MG tablet Take 1 tablet (1 mg total) by mouth daily. 90 tablet 1  . thiamine (VITAMIN B-1) 100 MG tablet Take 1 tablet (100 mg total) by mouth daily. 90 tablet 3   No current facility-administered medications on file prior to visit.     ROS Review of Systems  Constitutional: Negative for activity change, appetite change and fever.  HENT: Negative for congestion, rhinorrhea and sore throat.   Eyes: Negative for visual disturbance.  Respiratory: Negative for cough and shortness of breath.   Cardiovascular: Negative for chest pain and palpitations.  Gastrointestinal: Negative for abdominal pain, diarrhea and nausea.  Genitourinary: Negative for dysuria.  Musculoskeletal: Positive for arthralgias (recent arthroscopy, left knee) and back pain. Negative for myalgias.    Objective:  BP 93/66   Pulse (!) 101   Temp 97 F (36.1 C) (Oral)   Ht _0  (1.753 m)   Wt 161 lb 2 oz (73.1 kg)   LMP 08/21/2013   BMI 23.79 kg/m   BP Readings from Last 3 Encounters:  08/15/16 93/66  07/02/16 (!) 156/71  06/23/16 111/77    Wt Readings from Last 3 Encounters:  08/15/16 161 lb  2 oz (73.1 kg)  06/29/16 145 lb 1 oz (65.8 kg)  06/23/16 163 lb (73.9 kg)     Physical Exam  Constitutional: She is oriented to person, place, and time. She appears well-developed and well-nourished. No distress.  HENT:  Head:  Normocephalic and atraumatic.  Eyes: Conjunctivae are normal. Pupils are equal, round, and reactive to light.  Neck: Normal range of motion. Neck supple. No thyromegaly present.  Cardiovascular: Normal rate, regular rhythm and normal heart sounds.   No murmur heard. Pulmonary/Chest: Effort normal and breath sounds normal. No respiratory distress. She has no wheezes. She has no rales.  Abdominal: Soft. Bowel sounds are normal. She exhibits no distension. There is no tenderness.  Musculoskeletal: Normal range of motion.  Lymphadenopathy:    She has no cervical adenopathy.  Neurological: She is alert and oriented to person, place, and time.  Skin: Skin is warm and dry.  Psychiatric: She has a normal mood and affect. Her behavior is normal. Judgment and thought content normal.    Lab Results  Component Value Date   HGBA1C 4.7 11/13/2013   HGBA1C 5.0 10/22/2013    Lab Results  Component Value Date   WBC 5.6 07/01/2016   HGB 9.3 (L) 07/01/2016   HCT 29.3 (L) 07/01/2016   PLT 241 07/01/2016   GLUCOSE 89 07/01/2016   CHOL 210 (H) 08/05/2015   TRIG 81 08/05/2015   HDL 48 08/05/2015   LDLCALC 146 (H) 08/05/2015   ALT 11 (L) 06/28/2016   AST 17 06/28/2016   NA 137 07/01/2016   K 4.1 07/01/2016   CL 108 07/01/2016   CREATININE 1.09 (H) 07/01/2016   BUN 10 07/01/2016   CO2 22 07/01/2016   TSH 0.468 07/02/2016   INR 1.14 12/16/2015   HGBA1C 4.7 11/13/2013    Ct Abdomen Pelvis Wo Contrast  Result Date: 06/28/2016 CLINICAL DATA:  Worsening abdominal pain. EXAM: CT ABDOMEN AND PELVIS WITHOUT CONTRAST TECHNIQUE: Multidetector CT imaging of the abdomen and pelvis was performed following the standard protocol without IV contrast. COMPARISON:  Abdomen radiograph dated 06/23/2016. Abdomen and pelvis CT dated 02/04/2016. FINDINGS: Lower chest: Minimal patchy atelectasis or scarring in the left lower lobe anteriorly. Hepatobiliary: Distended gallbladder without abnormal dilatation, wall  thickening or visible gallstones. Normal non contrasted appearance of the liver. Pancreas: No mass or inflammatory process identified on this un-enhanced exam. Spleen: Within normal limits in size and appearance. Adrenals/Urinary Tract: Normal appearing adrenal glands, kidneys, ureters and urinary bladder. No calculi or hydronephrosis. No visible mass. Stomach/Bowel: Dilated, fluid-filled small bowel loops throughout the abdomen pelvis to the level of the terminal ileum. There is some matting of the small bowel loops at that location with some fecalization of the bowel contents. Normal caliber colon. Prominent stool in the rectum. No evidence of appendicitis. Small to moderate-sized sliding hiatal hernia with refluxed contrast in the distal esophagus. Vascular/Lymphatic: Atheromatous arterial calcifications, including the abdominal aorta. No enlarged lymph nodes. Reproductive: No mass or other significant abnormality. Other: None. Musculoskeletal: Lumbar spine degenerative changes at multiple levels. Mild anterior wedge deformities involving the L1, L2 and L3 vertebral bodies with minimal bony retropulsion. Mild diffuse compression deformity of the L5 vertebral body. No acute fracture lines. IMPRESSION: 1. Small bowel obstruction to the level of the terminal ileum were there is matting of the small bowel loops, suggesting adhesion formation. 2. Small to moderate-sized sliding hiatal hernia with gastroesophageal reflux. 3. Prominent stool in the rectum. 4. Aortic atherosclerosis. Electronically Signed   By:  Claudie Revering M.D.   On: 06/28/2016 21:35   Dg Chest 2 View  Result Date: 06/28/2016 CLINICAL DATA:  70 y/o  F; vomiting and abdominal pain. EXAM: CHEST  2 VIEW COMPARISON:  None. FINDINGS: The heart size and mediastinal contours are within normal limits. Both lungs are clear. The visualized skeletal structures are unremarkable. Prominent loops of small bowel in the left upper quadrant with fluid levels may  represent obstruction or ileus. Stable biapical scarring. IMPRESSION: No active cardiopulmonary disease. Prominent loops of small bowel in the left upper quadrant with fluid levels may represent obstruction or ileus. Electronically Signed   By: Kristine Garbe M.D.   On: 06/28/2016 19:53    Assessment & Plan:   L[place] was seen today for 3 month follow up.  Diagnoses and all orders for this visit:  Hyperlipidemia -     CBC with Differential/Platelet -     CMP14+EGFR -     Lipid panel  Essential hypertension -     CBC with Differential/Platelet -     CMP14+EGFR -     valsartan (DIOVAN) 160 MG tablet; Take 1 tablet (160 mg total) by mouth daily.  Right-sided low back pain without sciatica -     Discontinue: celecoxib (CELEBREX) 200 MG capsule; Take 1 capsule (200 mg total) by mouth daily. With food -     CBC with Differential/Platelet -     CMP14+EGFR -     celecoxib (CELEBREX) 200 MG capsule; Take 1 capsule (200 mg total) by mouth daily. With food  Hypothyroidism, unspecified hypothyroidism type -     TSH + free T4  Other orders -     cyclobenzaprine (FLEXERIL) 10 MG tablet; Take 1 tablet (10 mg total) by mouth 3 (three) times daily as needed. -     DULoxetine (CYMBALTA) 60 MG capsule; Take 1 capsule (60 mg total) by mouth daily. -     levothyroxine (SYNTHROID, LEVOTHROID) 112 MCG tablet; Take 1 tablet (112 mcg total) by mouth daily.      Medication List       Accurate as of 08/15/16  5:38 PM. Always use your most recent med list.          acetaminophen 500 MG tablet Commonly known as:  TYLENOL Take 1 tablet (500 mg total) by mouth 2 (two) times daily. Pain   aspirin EC 81 MG tablet Take 81 mg by mouth daily.   celecoxib 200 MG capsule Commonly known as:  CELEBREX Take 1 capsule (200 mg total) by mouth daily. With food   cyclobenzaprine 10 MG tablet Commonly known as:  FLEXERIL Take 1 tablet (10 mg total) by mouth 3 (three) times daily as needed.     DULoxetine 60 MG capsule Commonly known as:  CYMBALTA Take 1 capsule (60 mg total) by mouth daily.   Ferrous Fumarate 324 (106 Fe) MG Tabs tablet Commonly known as:  HEMOCYTE - 106 mg FE Take 1 tablet (106 mg of iron total) by mouth 2 (two) times daily.   folic acid 1 MG tablet Commonly known as:  FOLVITE Take 1 tablet (1 mg total) by mouth daily.   levothyroxine 112 MCG tablet Commonly known as:  SYNTHROID, LEVOTHROID Take 1 tablet (112 mcg total) by mouth daily.   thiamine 100 MG tablet Commonly known as:  VITAMIN B-1 Take 1 tablet (100 mg total) by mouth daily.   valsartan 160 MG tablet Commonly known as:  DIOVAN Take 1 tablet (160 mg total) by  mouth daily.         Follow-up: No Follow-up on file.  Claretta Fraise, M.D.

## 2016-08-16 LAB — CMP14+EGFR
ALT: 6 IU/L (ref 0–32)
AST: 8 IU/L (ref 0–40)
Albumin/Globulin Ratio: 1.5 (ref 1.2–2.2)
Albumin: 4.4 g/dL (ref 3.6–4.8)
Alkaline Phosphatase: 104 IU/L (ref 39–117)
BUN/Creatinine Ratio: 19 (ref 12–28)
BUN: 30 mg/dL — ABNORMAL HIGH (ref 8–27)
Bilirubin Total: 0.3 mg/dL (ref 0.0–1.2)
CO2: 23 mmol/L (ref 18–29)
Calcium: 9.7 mg/dL (ref 8.7–10.3)
Chloride: 100 mmol/L (ref 96–106)
Creatinine, Ser: 1.57 mg/dL — ABNORMAL HIGH (ref 0.57–1.00)
GFR calc Af Amer: 39 mL/min/{1.73_m2} — ABNORMAL LOW (ref >59)
GFR calc non Af Amer: 34 mL/min/{1.73_m2} — ABNORMAL LOW (ref >59)
Globulin, Total: 2.9 g/dL (ref 1.5–4.5)
Glucose: 63 mg/dL — ABNORMAL LOW (ref 65–99)
Potassium: 4.7 mmol/L (ref 3.5–5.2)
Sodium: 142 mmol/L (ref 134–144)
Total Protein: 7.3 g/dL (ref 6.0–8.5)

## 2016-08-16 LAB — CBC WITH DIFFERENTIAL/PLATELET
Basophils Absolute: 0 10*3/uL (ref 0.0–0.2)
Basos: 0 %
EOS (ABSOLUTE): 0.2 10*3/uL (ref 0.0–0.4)
Eos: 3 %
Hematocrit: 41.6 % (ref 34.0–46.6)
Hemoglobin: 13.3 g/dL (ref 11.1–15.9)
Immature Grans (Abs): 0 10*3/uL (ref 0.0–0.1)
Immature Granulocytes: 1 %
Lymphocytes Absolute: 1.8 10*3/uL (ref 0.7–3.1)
Lymphs: 26 %
MCH: 28 pg (ref 26.6–33.0)
MCHC: 32 g/dL (ref 31.5–35.7)
MCV: 88 fL (ref 79–97)
Monocytes Absolute: 0.5 10*3/uL (ref 0.1–0.9)
Monocytes: 7 %
Neutrophils Absolute: 4.3 10*3/uL (ref 1.4–7.0)
Neutrophils: 63 %
Platelets: 295 10*3/uL (ref 150–379)
RBC: 4.75 x10E6/uL (ref 3.77–5.28)
RDW: 16.1 % — ABNORMAL HIGH (ref 12.3–15.4)
WBC: 6.8 10*3/uL (ref 3.4–10.8)

## 2016-08-16 LAB — LIPID PANEL
Chol/HDL Ratio: 5.5 ratio units — ABNORMAL HIGH (ref 0.0–4.4)
Cholesterol, Total: 233 mg/dL — ABNORMAL HIGH (ref 100–199)
HDL: 42 mg/dL (ref >39)
LDL Calculated: 149 mg/dL — ABNORMAL HIGH (ref 0–99)
Triglycerides: 209 mg/dL — ABNORMAL HIGH (ref 0–149)
VLDL Cholesterol Cal: 42 mg/dL — ABNORMAL HIGH (ref 5–40)

## 2016-08-16 LAB — TSH+FREE T4
Free T4: 1.42 ng/dL (ref 0.82–1.77)
TSH: 1.68 u[IU]/mL (ref 0.450–4.500)

## 2016-08-28 ENCOUNTER — Ambulatory Visit: Payer: Medicare Other | Attending: Orthopedic Surgery | Admitting: Physical Therapy

## 2016-08-28 ENCOUNTER — Encounter: Payer: Self-pay | Admitting: Physical Therapy

## 2016-08-28 DIAGNOSIS — R600 Localized edema: Secondary | ICD-10-CM | POA: Insufficient documentation

## 2016-08-28 DIAGNOSIS — M6281 Muscle weakness (generalized): Secondary | ICD-10-CM | POA: Insufficient documentation

## 2016-08-28 DIAGNOSIS — M25562 Pain in left knee: Secondary | ICD-10-CM | POA: Insufficient documentation

## 2016-08-28 DIAGNOSIS — M25662 Stiffness of left knee, not elsewhere classified: Secondary | ICD-10-CM | POA: Insufficient documentation

## 2016-08-28 NOTE — Patient Instructions (Signed)
  Quad Set - place a towel roll/pillow under your ankle first.   With other leg bent, foot flat, slowly tighten muscles on thigh of straight leg while counting out loud to _10___. Repeat with other leg. Repeat _10-20__ times. Do _4-5___ sessions per day.   [employer] a large can or rolled towel under leg. Straighten knee and leg. Hold _5-10 seconds. Repeat with other leg. Repeat __10-20_ times. Do _4-5_ sessions per day.  http://gt2.exer.us/365   Copyright  VHI. All rights reserved.   Hamstring Set   With one leg bent slightly, push heel into bed without bending knee further. Hold _5-10___ seconds. Alternate legs. Repeat _10-20___ times. Do _4-5__ sessions per day.  Hip Flexion / Knee Extension: Straight-Leg Raise (Eccentric)    Lie on back. Lift leg with knee straight. Slowly lower leg for 3-5 seconds. __10_ reps per set, _4-5__ sets per day. Lower like elevator, stopping at each floor.   Madelyn Flavors, PT 08/28/16 1:46 PM [facility] [place], [place], [address] Phone: [phone]   Fax:  [phone]

## 2016-08-28 NOTE — Therapy (Signed)
[facility] [place], [place], [address] Phone: [phone]   Fax:  [phone]  Physical Therapy Evaluation  Patient Details  Name: [name] MRN: [id number] Date of Birth: 07-Mar-1946 Referring Provider: Latanya Maudlin, MD  Encounter Date: 08/28/2016      PT End of Session - 08/28/16 1346    Visit Number 1   Number of Visits 8   Date for PT Re-Evaluation 09/25/16   PT Start Time [id number]   PT Stop Time 1357   PT Time Calculation (min) 53 min   Activity Tolerance Patient tolerated treatment well   Behavior During Therapy [facility] for tasks assessed/performed      Past Medical History:  Diagnosis Date  . Alcoholism ([place]) 01/09/2012  . Anemia   . Anxiety   . B12 deficiency   . Carotid artery narrowing 10/03/2012   On the right.  . Chronic kidney disease (CKD), stage II (mild)   . COPD (chronic obstructive pulmonary disease) ([place])   . Depression   . Headache(784.0)   . Hyperlipidemia   . Hypertension   . Hyponatremia 01/09/2012  . Hypothyroid   . Menopause   . Mini stroke ([place])   . Orthostatic syncope 10/02/2012  . Pneumonia   . SBO (small bowel obstruction) ([place]) 06/28/2016  . Shortness of breath   . Stroke [facility]) 2010   no deficits    Past Surgical History:  Procedure Laterality Date  . AGILE CAPSULE N/A 02/22/2016   dummy capsule remained in the distal ileum  . APPENDECTOMY    . CATARACT EXTRACTION W/PHACO  06/20/2012   Procedure: CATARACT EXTRACTION PHACO AND INTRAOCULAR LENS PLACEMENT (IOC);  Surgeon: Tonny Branch, MD;  Location: [facility];  Service: Ophthalmology;  Laterality: Right;  CDE:10.66  . CATARACT EXTRACTION W/PHACO  07/11/2012   Procedure: CATARACT EXTRACTION PHACO AND INTRAOCULAR LENS PLACEMENT (IOC);  Surgeon: Tonny Branch, MD;  Location: [facility];  Service: Ophthalmology;  Laterality: Left;  CDE: 12.69  . COLONOSCOPY N/A 12/17/2015   RMR: Marked circumferential ulceration of the ascending colon/Ileocecal  valve ulceration with biopsy most consistent with NSAID injury versus inflammatory bowel disease, pancolonoc diverticulosis redundant colon  . ESOPHAGOGASTRODUODENOSCOPY N/A 12/17/2015   [id number] gastric polyp, biopsy benign  . TONSILLECTOMY      There were no vitals filed for this visit.       Subjective Assessment - 08/28/16 1308    Subjective Patient had a left knee scope on 08/02/16. She reports there was a large tear in her meniscus and a lot of arthritis.    Pertinent History HTN, COPD, CVA affecting L side (2015)   Limitations Standing;Walking   How long can you stand comfortably? 10 min   How long can you walk comfortably? 10 min   Diagnostic tests xray shows anterior compression L1-3 and Gd 1 anterolisthesis of L3 on L4   Patient Stated Goals to get strength back in her leg and be able to walk   Currently in Pain? Yes   Pain Score 9    Pain Location Knee   Pain Orientation Left   Pain Descriptors / Indicators Stabbing   Pain Type Surgical pain   Pain Onset 1 to 4 weeks ago   Pain Frequency Constant   Aggravating Factors  standing and walking   Pain Relieving Factors rest   Effect of Pain on Daily Activities limited            [facility] PT Assessment - 08/28/16 0001  Assessment   Medical Diagnosis s/p L knee arthroscopy   Referring Provider Latanya Maudlin, MD   Onset Date/Surgical Date 08/02/16   Next MD Visit 2 weeks     Precautions   Precautions Fall   Precaution Comments Bee sting allergy     Balance Screen   Has the patient fallen in the past 6 months Yes   How many times? 2   Has the patient had a decrease in activity level because of a fear of falling?  Yes   Is the patient reluctant to leave their home because of a fear of falling?  Yes     [place] residence   Available Help at Discharge Family  lives with son   Additional Comments couple of steps in with railing; able to do slowly     Prior Function    Level of Independence Independent with household mobility with device     Observation/Other Assessments   Focus on Therapeutic Outcomes (FOTO)  FOTO 63% limited     Observation/Other Assessments-Edema    Edema Circumferential     Circumferential Edema   Circumferential - Right 41 cm   Circumferential - Left  43.5 cm     Posture/Postural Control   Posture Comments stands with decreased WB on LLE; Left knee valgus; L hip in ER     ROM / Strength   AROM / PROM / Strength AROM;PROM;Strength     AROM   AROM Assessment Site Knee   Right/Left Knee Left   Left Knee Extension -7   Left Knee Flexion 94     PROM   PROM Assessment Site Knee   Right/Left Knee Left   Left Knee Extension -5   Left Knee Flexion 122     Strength   Overall Strength Comments L hip flex and ABD 4-/5; ext 3+/5; L knee flex 4+/5, ext 5/5     Palpation   Palpation comment tender along medial joint line     Ambulation/Gait   Gait Comments amb with cane in LUE, decreased WB on LLE and L hip ER                   [facility] Adult PT Treatment/Exercise - 08/28/16 0001      Modalities   Modalities Electrical Stimulation;Vasopneumatic     Electrical Stimulation   Electrical Stimulation Location IFC 1-10Hz  to L knee x 15 min to tolerance   Electrical Stimulation Goals Pain;Edema     Vasopneumatic   Number Minutes Vasopneumatic  15 minutes   Vasopnuematic Location  Knee   Vasopneumatic Pressure Medium   Vasopneumatic Temperature  34                 PT Education - 08/28/16 1347    Education provided Yes   Education Details HEP   Person(s) Educated Patient   Methods Explanation;Demonstration;Handout   Comprehension Verbalized understanding;Returned demonstration             PT Long Term Goals - 08/28/16 2312      PT LONG TERM GOAL #1   Title I with HEP   Time 4   Period Weeks   Status New     PT LONG TERM GOAL #2   Title decreased pain with ADLS to [id number] or less.   Time 4    Period Weeks   Status New     PT LONG TERM GOAL #3   Title Full knee extension to  normalize gait.   Time 4   Period Weeks   Status New     PT LONG TERM GOAL #4   Title demo 4+/5 L hip and knee strength to improve standing/walking tolerance   Time 4   Period Weeks   Status New               Plan - 08/28/16 2303    Clinical Impression Statement Patient presents s/p L knee arthroscopy. She amb with a SPC and decreased WB on LLE. She has pain decreased ROM and weakness in LLE. These deficits are affecting her gait and limiting standing.    Rehab Potential Excellent   PT Frequency 2x / week   PT Duration 4 weeks   PT Treatment/Interventions ADLs/Self Care Home Management;Cryotherapy;Pharmacist, hospital;Therapeutic exercise;Patient/family education;Neuromuscular re-education;Balance training;Manual techniques;Vasopneumatic Device;Passive range of motion   PT Next Visit Plan L knee and hip strengthening, ROM, modalities for pain.   PT Home Exercise Plan quad/HS set, SAQ, SLR, heel slide   Consulted and Agree with Plan of Care Patient      Patient will benefit from skilled therapeutic intervention in order to improve the following deficits and impairments:  Abnormal gait, Pain, Postural dysfunction, Decreased activity tolerance, Decreased strength, Increased edema  Visit Diagnosis: Stiffness of left knee, not elsewhere classified - Plan: PT plan of care cert/re-cert  Pain in left knee - Plan: PT plan of care cert/re-cert  Muscle weakness (generalized) - Plan: PT plan of care cert/re-cert  Localized edema - Plan: PT plan of care cert/re-cert      G-Codes - [id number] 03/30/14    Functional Assessment Tool Used FOTO 63% limited   Functional Limitation Mobility: Walking and moving around   Mobility: Walking and Moving Around Current Status [phone]) At least 60 percent but less than 80 percent impaired, limited or restricted   Mobility: Walking and  Moving Around Goal Status [phone]) At least 40 percent but less than 60 percent impaired, limited or restricted       Problem List Patient Active Problem List   Diagnosis Date Noted  . Acute confusional state 07/01/2016  . Acute renal failure ([place]) 06/29/2016  . Sinus tachycardia ([place]) 06/29/2016  . Transaminitis 03/10/2016  . Pelvic mass 01/27/2016  . Periumbilical abdominal pain 01/27/2016  . Colonic ulcer   . Diverticulosis of colon without hemorrhage   . Iron deficiency anemia due to chronic blood loss   . Lumbar pain 12/14/2015  . Chronic kidney disease (CKD), stage II (mild) 10/03/2012  . Carotid artery narrowing 10/03/2012  . Hypothyroidism 10/02/2012  . Essential hypertension 01/09/2012  . Hyperlipidemia 01/09/2012  . Anemia 01/09/2012  . B12 deficiency 01/09/2012    Madelyn Flavors PT 08/28/2016, 11:26 PM  [facility] [place], [place], [address] Phone: [phone]   Fax:  [phone]  Name: [name] MRN: [id number] Date of Birth: [birth year]

## 2016-08-30 ENCOUNTER — Ambulatory Visit: Payer: Medicare Other | Admitting: Physical Therapy

## 2016-08-30 ENCOUNTER — Encounter: Payer: Self-pay | Admitting: Physical Therapy

## 2016-08-30 DIAGNOSIS — M6281 Muscle weakness (generalized): Secondary | ICD-10-CM

## 2016-08-30 DIAGNOSIS — R600 Localized edema: Secondary | ICD-10-CM

## 2016-08-30 DIAGNOSIS — M25662 Stiffness of left knee, not elsewhere classified: Secondary | ICD-10-CM

## 2016-08-30 DIAGNOSIS — M25562 Pain in left knee: Secondary | ICD-10-CM | POA: Diagnosis not present

## 2016-08-30 NOTE — Therapy (Signed)
[facility] [place], [place], [address] Phone: [phone]   Fax:  [phone]  Physical Therapy Treatment  Patient Details  Name: [name] MRN: [id number] Date of Birth: Aug 02, 1946 Referring Provider: Latanya Maudlin, MD  Encounter Date: 08/30/2016      PT End of Session - 08/30/16 1304    Visit Number 2   Number of Visits 8   Date for PT Re-Evaluation 09/25/16   PT Start Time 1302   PT Stop Time 1333   PT Time Calculation (min) 31 min   Activity Tolerance Patient limited by pain   Behavior During Therapy [facility] for tasks assessed/performed      Past Medical History:  Diagnosis Date  . Alcoholism ([place]) 01/09/2012  . Anemia   . Anxiety   . B12 deficiency   . Carotid artery narrowing 10/03/2012   On the right.  . Chronic kidney disease (CKD), stage II (mild)   . COPD (chronic obstructive pulmonary disease) ([place])   . Depression   . Headache(784.0)   . Hyperlipidemia   . Hypertension   . Hyponatremia 01/09/2012  . Hypothyroid   . Menopause   . Mini stroke ([place])   . Orthostatic syncope 10/02/2012  . Pneumonia   . SBO (small bowel obstruction) ([place]) 06/28/2016  . Shortness of breath   . Stroke [facility]) 2010   no deficits    Past Surgical History:  Procedure Laterality Date  . AGILE CAPSULE N/A 02/22/2016   dummy capsule remained in the distal ileum  . APPENDECTOMY    . CATARACT EXTRACTION W/PHACO  06/20/2012   Procedure: CATARACT EXTRACTION PHACO AND INTRAOCULAR LENS PLACEMENT (IOC);  Surgeon: Tonny Branch, MD;  Location: [facility];  Service: Ophthalmology;  Laterality: Right;  CDE:10.66  . CATARACT EXTRACTION W/PHACO  07/11/2012   Procedure: CATARACT EXTRACTION PHACO AND INTRAOCULAR LENS PLACEMENT (IOC);  Surgeon: Tonny Branch, MD;  Location: [facility];  Service: Ophthalmology;  Laterality: Left;  CDE: 12.69  . COLONOSCOPY N/A 12/17/2015   RMR: Marked circumferential ulceration of the ascending colon/Ileocecal valve  ulceration with biopsy most consistent with NSAID injury versus inflammatory bowel disease, pancolonoc diverticulosis redundant colon  . ESOPHAGOGASTRODUODENOSCOPY N/A 12/17/2015   [id number] gastric polyp, biopsy benign  . TONSILLECTOMY      There were no vitals filed for this visit.      Subjective Assessment - 08/30/16 1303    Subjective Reports that L knee is very sore today.   Pertinent History HTN, COPD, CVA affecting L side (2015)   Limitations Standing;Walking   How long can you stand comfortably? 10 min   How long can you walk comfortably? 10 min   Diagnostic tests xray shows anterior compression L1-3 and Gd 1 anterolisthesis of L3 on L4   Patient Stated Goals to get strength back in her leg and be able to walk   Currently in Pain? Yes   Pain Score 8    Pain Location Knee   Pain Orientation Left   Pain Descriptors / Indicators Sore   Pain Type Surgical pain   Pain Onset 1 to 4 weeks ago   Aggravating Factors  Standing, walking   Pain Relieving Factors Rest            [facility] PT Assessment - 08/30/16 0001      Assessment   Medical Diagnosis s/p L knee arthroscopy   Onset Date/Surgical Date 08/02/16   Next MD Visit 2 weeks     Precautions  Precautions Fall   Precaution Comments Bee sting allergy                     [facility] Adult PT Treatment/Exercise - 08/30/16 0001      Exercises   Exercises Knee/Hip     Knee/Hip Exercises: Aerobic   Nustep L3 x4 min  discontinued for today secondary to L knee pain     Knee/Hip Exercises: Standing   Hip Flexion AROM;Left;10 reps;Knee bent  stopped secondary to increased L knee pain   Rocker Board 3 minutes     Modalities   Modalities Electrical Stimulation;Vasopneumatic     Acupuncturist Location L knee   Electrical Stimulation Action IFC   Electrical Stimulation Parameters 1-10 hz x15 min   Electrical Stimulation Goals Pain;Edema     Vasopneumatic   Number  Minutes Vasopneumatic  15 minutes   Vasopnuematic Location  Knee   Vasopneumatic Pressure Medium   Vasopneumatic Temperature  34                      PT Long Term Goals - 08/28/16 2312      PT LONG TERM GOAL #1   Title I with HEP   Time 4   Period Weeks   Status New     PT LONG TERM GOAL #2   Title decreased pain with ADLS to [id number] or less.   Time 4   Period Weeks   Status New     PT LONG TERM GOAL #3   Title Full knee extension to normalize gait.   Time 4   Period Weeks   Status New     PT LONG TERM GOAL #4   Title demo 4+/5 L hip and knee strength to improve standing/walking tolerance   Time 4   Period Weeks   Status New               Plan - 08/30/16 1327    Clinical Impression Statement Patient arrived today with increased L knee soreness and with SPC for ambulation at this time. Gentle NuStep was initiated to try gentle ROM but patient requested to stop secondary to increased L knee pain. Patient had no complaints during rockerboard exercise but by end of three minute stretching session patient was trying to unload weightbearing on LLE. Patient had increased L knee pain again with standing AROM L hip flexion with flexed L knee. Therpeutic exercise was discontinued for today secondary to repeated reports of increased L knee pain. Normal modalities response noted following removal of the modaliites. Patient denied a full L knee sensation only reporting "hurting." Patient again reported pain upon weightbearing through LLE following modalities. Patient was encouraged to continue HEP as given in evaluation and icing at home 10-15 minutes at a time to reduce pain and inflammation.   Rehab Potential Excellent   PT Frequency 2x / week   PT Duration 4 weeks   PT Treatment/Interventions ADLs/Self Care Home Management;Cryotherapy;Pharmacist, hospital;Therapeutic exercise;Patient/family education;Neuromuscular re-education;Balance  training;Manual techniques;Vasopneumatic Device;Passive range of motion   PT Next Visit Plan L knee and hip strengthening, ROM, modalities for pain.   PT Home Exercise Plan quad/HS set, SAQ, SLR, heel slide   Consulted and Agree with Plan of Care Patient      Patient will benefit from skilled therapeutic intervention in order to improve the following deficits and impairments:  Abnormal gait, Pain, Postural dysfunction, Decreased activity tolerance, Decreased strength, Increased edema  Visit Diagnosis: Stiffness of left knee, not elsewhere classified  Pain in left knee  Muscle weakness (generalized)  Localized edema     Problem List Patient Active Problem List   Diagnosis Date Noted  . Acute confusional state 07/01/2016  . Acute renal failure ([place]) 06/29/2016  . Sinus tachycardia ([employer]) 06/29/2016  . Transaminitis 03/10/2016  . Pelvic mass 01/27/2016  . Periumbilical abdominal pain 01/27/2016  . Colonic ulcer   . Diverticulosis of colon without hemorrhage   . Iron deficiency anemia due to chronic blood loss   . Lumbar pain 12/14/2015  . Chronic kidney disease (CKD), stage II (mild) 10/03/2012  . Carotid artery narrowing 10/03/2012  . Hypothyroidism 10/02/2012  . Essential hypertension 01/09/2012  . Hyperlipidemia 01/09/2012  . Anemia 01/09/2012  . B12 deficiency 01/09/2012    Wynelle Fanny, PTA 08/30/2016, 3:08 PM  [facility] [address] [place], [place], [address] Phone: [phone]   Fax:  [phone]  Name: [name] MRN: [id number] Date of Birth: 05/10/1946

## 2016-09-04 ENCOUNTER — Encounter: Payer: Self-pay | Admitting: Physical Therapy

## 2016-09-04 ENCOUNTER — Ambulatory Visit: Payer: Medicare Other | Admitting: Physical Therapy

## 2016-09-04 DIAGNOSIS — M6281 Muscle weakness (generalized): Secondary | ICD-10-CM

## 2016-09-04 DIAGNOSIS — M25562 Pain in left knee: Secondary | ICD-10-CM | POA: Diagnosis not present

## 2016-09-04 DIAGNOSIS — M25662 Stiffness of left knee, not elsewhere classified: Secondary | ICD-10-CM

## 2016-09-04 DIAGNOSIS — R600 Localized edema: Secondary | ICD-10-CM

## 2016-09-04 NOTE — Therapy (Signed)
[facility] [place], [place], [address] Phone: [phone]   Fax:  [phone]  Physical Therapy Treatment  Patient Details  Name: [name] MRN: [id number] Date of Birth: 1946/10/24 Referring Provider: Latanya Maudlin, MD  Encounter Date: 09/04/2016      PT End of Session - 09/04/16 1302    Visit Number 3   Number of Visits 8   Date for PT Re-Evaluation 09/25/16   PT Start Time 1304   PT Stop Time [id number]   PT Time Calculation (min) 43 min   Activity Tolerance Patient tolerated treatment well;Patient limited by pain   Behavior During Therapy [facility] for tasks assessed/performed      Past Medical History:  Diagnosis Date  . Alcoholism ([place]) 01/09/2012  . Anemia   . Anxiety   . B12 deficiency   . Carotid artery narrowing 10/03/2012   On the right.  . Chronic kidney disease (CKD), stage II (mild)   . COPD (chronic obstructive pulmonary disease) ([place])   . Depression   . Headache(784.0)   . Hyperlipidemia   . Hypertension   . Hyponatremia 01/09/2012  . Hypothyroid   . Menopause   . Mini stroke ([place])   . Orthostatic syncope 10/02/2012  . Pneumonia   . SBO (small bowel obstruction) ([place]) 06/28/2016  . Shortness of breath   . Stroke [facility]) 2010   no deficits    Past Surgical History:  Procedure Laterality Date  . AGILE CAPSULE N/A 02/22/2016   dummy capsule remained in the distal ileum  . APPENDECTOMY    . CATARACT EXTRACTION W/PHACO  06/20/2012   Procedure: CATARACT EXTRACTION PHACO AND INTRAOCULAR LENS PLACEMENT (IOC);  Surgeon: Tonny Branch, MD;  Location: [facility];  Service: Ophthalmology;  Laterality: Right;  CDE:10.66  . CATARACT EXTRACTION W/PHACO  07/11/2012   Procedure: CATARACT EXTRACTION PHACO AND INTRAOCULAR LENS PLACEMENT (IOC);  Surgeon: Tonny Branch, MD;  Location: [facility];  Service: Ophthalmology;  Laterality: Left;  CDE: 12.69  . COLONOSCOPY N/A 12/17/2015   RMR: Marked circumferential ulceration of the  ascending colon/Ileocecal valve ulceration with biopsy most consistent with NSAID injury versus inflammatory bowel disease, pancolonoc diverticulosis redundant colon  . ESOPHAGOGASTRODUODENOSCOPY N/A 12/17/2015   [id number] gastric polyp, biopsy benign  . TONSILLECTOMY      There were no vitals filed for this visit.      Subjective Assessment - 09/04/16 1302    Subjective Reports that she doesn't understand why the lower part of L leg burns or hurts.   Pertinent History HTN, COPD, CVA affecting L side (2015)   Limitations Standing;Walking   How long can you stand comfortably? 10 min   How long can you walk comfortably? 10 min   Diagnostic tests xray shows anterior compression L1-3 and Gd 1 anterolisthesis of L3 on L4   Patient Stated Goals to get strength back in her leg and be able to walk   Currently in Pain? Yes   Pain Score 8    Pain Location Leg   Pain Orientation Left;Lower   Pain Descriptors / Indicators Sharp   Pain Type Surgical pain   Pain Onset 1 to 4 weeks ago   Pain Frequency Intermittent  At times walking or resting            [facility] PT Assessment - 09/04/16 0001      Assessment   Medical Diagnosis s/p L knee arthroscopy   Onset Date/Surgical Date 08/02/16   Next MD Visit 2  weeks     Precautions   Precautions Fall   Precaution Comments Bee sting allergy     ROM / Strength   AROM / PROM / Strength AROM     AROM   Overall AROM  Within functional limits for tasks performed   AROM Assessment Site Knee   Right/Left Knee Left   Left Knee Extension 0   Left Knee Flexion 125                     [facility] Adult PT Treatment/Exercise - 09/04/16 0001      Knee/Hip Exercises: Stretches   Active Hamstring Stretch Left;3 reps;30 seconds     Knee/Hip Exercises: Standing   Heel Raises Both;2 sets;10 reps   Heel Raises Limitations B toe raise x20 reps   Rocker Board 2 minutes     Knee/Hip Exercises: Seated   Long Arc Quad Strengthening;Left;2  sets;10 reps;Weights   Long Arc Quad Weight 2 lbs.     Knee/Hip Exercises: Supine   Short Arc Quad Sets Strengthening;Left;2 sets;10 reps   Heel Slides AROM;Left;5 reps  Stopped secondary to L knee pain   Hip Adduction Isometric Strengthening;Both;2 sets;10 reps   Straight Leg Raises Strengthening;Left;2 sets;10 reps   Other Supine Knee/Hip Exercises L HS set 2x10 reps     Knee/Hip Exercises: Sidelying   Hip ABduction Strengthening;Left;2 sets;10 reps     Modalities   Modalities Vasopneumatic     Vasopneumatic   Number Minutes Vasopneumatic  15 minutes   Vasopnuematic Location  Knee   Vasopneumatic Pressure Medium   Vasopneumatic Temperature  52     Manual Therapy   Manual Therapy Soft tissue mobilization   Soft tissue mobilization L patellar mobilizations sup/inf, lat/med to promote proper mobility                     PT Long Term Goals - 09/04/16 1341      PT LONG TERM GOAL #1   Title I with HEP   Time 4   Period Weeks   Status Achieved     PT LONG TERM GOAL #2   Title decreased pain with ADLS to [id number] or less.   Time 4   Period Weeks   Status On-going     PT LONG TERM GOAL #3   Title Full knee extension to normalize gait.   Time 4   Period Weeks   Status Achieved  AROM L knee measured as 0-125 deg 09/04/2016     PT LONG TERM GOAL #4   Title demo 4+/5 L hip and knee strength to improve standing/walking tolerance   Time 4   Period Weeks   Status On-going               Plan - 09/04/16 1333    Clinical Impression Statement Patient arrived today with continued increased L knee pain that is sharp at intermittant times. Patient demonstrated L Quad weakness with exercises such as LAQ, SLR with extensor lag present. Patient continues to utilize [facility] for ambulation. Patient reported increased L knee pain with supine heel slides today thus exercise was discontinued. Patient also noted discomfort with L knee during L hip abduction exercise. L  patellar mobility into sup/inf directions was limited during manual therapy treatment today with no limitation in med/lat directions. L knee popping was obesrved intermittantly with exercises today. AROM of L knee measured as 0-125 deg in supine although AROM completely slowly into flexion secondary to  discomfort. Normal vasopneumatic response noted following removal of the modalities.    Rehab Potential Excellent   PT Frequency 2x / week   PT Duration 4 weeks   PT Treatment/Interventions ADLs/Self Care Home Management;Cryotherapy;Pharmacist, hospital;Therapeutic exercise;Patient/family education;Neuromuscular re-education;Balance training;Manual techniques;Vasopneumatic Device;Passive range of motion   PT Next Visit Plan L knee and hip strengthening, ROM, modalities for pain.   PT Home Exercise Plan quad/HS set, SAQ, SLR, heel slide   Consulted and Agree with Plan of Care Patient      Patient will benefit from skilled therapeutic intervention in order to improve the following deficits and impairments:  Abnormal gait, Pain, Postural dysfunction, Decreased activity tolerance, Decreased strength, Increased edema  Visit Diagnosis: Stiffness of left knee, not elsewhere classified  Pain in left knee  Muscle weakness (generalized)  Localized edema     Problem List Patient Active Problem List   Diagnosis Date Noted  . Acute confusional state 07/01/2016  . Acute renal failure ([place]) 06/29/2016  . Sinus tachycardia ([place]) 06/29/2016  . Transaminitis 03/10/2016  . Pelvic mass 01/27/2016  . Periumbilical abdominal pain 01/27/2016  . Colonic ulcer   . Diverticulosis of colon without hemorrhage   . Iron deficiency anemia due to chronic blood loss   . Lumbar pain 12/14/2015  . Chronic kidney disease (CKD), stage II (mild) 10/03/2012  . Carotid artery narrowing 10/03/2012  . Hypothyroidism 10/02/2012  . Essential hypertension 01/09/2012  . Hyperlipidemia 01/09/2012   . Anemia 01/09/2012  . B12 deficiency 01/09/2012    Wynelle Fanny, PTA 09/04/2016, 2:26 PM  [facility] [address] [place], [place], [address] Phone: [phone]   Fax:  [phone]  Name: [name] MRN: [id number] Date of Birth: January 03, 1946

## 2016-09-07 ENCOUNTER — Ambulatory Visit: Payer: Medicare Other | Admitting: *Deleted

## 2016-09-07 DIAGNOSIS — M6281 Muscle weakness (generalized): Secondary | ICD-10-CM | POA: Diagnosis not present

## 2016-09-07 DIAGNOSIS — M25662 Stiffness of left knee, not elsewhere classified: Secondary | ICD-10-CM

## 2016-09-07 DIAGNOSIS — M25562 Pain in left knee: Secondary | ICD-10-CM | POA: Diagnosis not present

## 2016-09-07 DIAGNOSIS — R600 Localized edema: Secondary | ICD-10-CM | POA: Diagnosis not present

## 2016-09-07 NOTE — Therapy (Signed)
[facility] [place], [place], [address] Phone: [phone]   Fax:  [phone]  Physical Therapy Treatment  Patient Details  Name: [name] MRN: [id number] Date of Birth: 03-Jul-1946 Referring Provider: Latanya Maudlin, MD  Encounter Date: 09/07/2016      PT End of Session - 09/07/16 1310    Visit Number 4   Number of Visits 8   Date for PT Re-Evaluation 09/25/16   PT Start Time 1300   PT Stop Time [id number]   PT Time Calculation (min) 52 min      Past Medical History:  Diagnosis Date  . Alcoholism ([place]) 01/09/2012  . Anemia   . Anxiety   . B12 deficiency   . Carotid artery narrowing 10/03/2012   On the right.  . Chronic kidney disease (CKD), stage II (mild)   . COPD (chronic obstructive pulmonary disease) ([place])   . Depression   . Headache(784.0)   . Hyperlipidemia   . Hypertension   . Hyponatremia 01/09/2012  . Hypothyroid   . Menopause   . Mini stroke ([place])   . Orthostatic syncope 10/02/2012  . Pneumonia   . SBO (small bowel obstruction) ([place]) 06/28/2016  . Shortness of breath   . Stroke [facility]) 2010   no deficits    Past Surgical History:  Procedure Laterality Date  . AGILE CAPSULE N/A 02/22/2016   dummy capsule remained in the distal ileum  . APPENDECTOMY    . CATARACT EXTRACTION W/PHACO  06/20/2012   Procedure: CATARACT EXTRACTION PHACO AND INTRAOCULAR LENS PLACEMENT (IOC);  Surgeon: Tonny Branch, MD;  Location: [facility];  Service: Ophthalmology;  Laterality: Right;  CDE:10.66  . CATARACT EXTRACTION W/PHACO  07/11/2012   Procedure: CATARACT EXTRACTION PHACO AND INTRAOCULAR LENS PLACEMENT (IOC);  Surgeon: Tonny Branch, MD;  Location: [facility];  Service: Ophthalmology;  Laterality: Left;  CDE: 12.69  . COLONOSCOPY N/A 12/17/2015   RMR: Marked circumferential ulceration of the ascending colon/Ileocecal valve ulceration with biopsy most consistent with NSAID injury versus inflammatory bowel disease, pancolonoc  diverticulosis redundant colon  . ESOPHAGOGASTRODUODENOSCOPY N/A 12/17/2015   [id number] gastric polyp, biopsy benign  . TONSILLECTOMY      There were no vitals filed for this visit.      Subjective Assessment - 09/07/16 1307    Subjective Reports that she doesn't understand why the lower part of L leg burns or hurts.   Pertinent History HTN, COPD, CVA affecting L side (2015)   Limitations Standing;Walking   How long can you stand comfortably? 10 min   How long can you walk comfortably? 10 min   Diagnostic tests xray shows anterior compression L1-3 and Gd 1 anterolisthesis of L3 on L4   Patient Stated Goals to get strength back in her leg and be able to walk   Currently in Pain? Yes   Pain Score 7    Pain Location Leg   Pain Orientation Left   Pain Descriptors / Indicators Sharp   Pain Type Surgical pain   Pain Onset 1 to 4 weeks ago   Pain Frequency Intermittent                         [facility] Adult PT Treatment/Exercise - 09/07/16 0001      Exercises   Exercises Knee/Hip     Knee/Hip Exercises: Stretches   Active Hamstring Stretch Left;3 reps;30 seconds     Knee/Hip Exercises: Aerobic   Recumbent Bike 0 resistance  x 10 mins     Knee/Hip Exercises: Standing   Heel Raises Both;2 sets;10 reps   Heel Raises Limitations B toe raise x20 reps   Hip Flexion AROM;10 reps;Knee bent;Both;2 sets  marching     Rocker Board 5 minutes  calf stretching     Knee/Hip Exercises: Seated   Long Arc Quad Strengthening;Left;10 reps;Weights;3 sets   Long Arc Quad Weight 2 lbs.     Knee/Hip Exercises: Supine   Straight Leg Raises Strengthening;Left;10 reps;3 sets     Knee/Hip Exercises: Sidelying   Hip ABduction Strengthening;Left;10 reps;3 sets     Modalities   Modalities Vasopneumatic     Electrical Stimulation   Electrical Stimulation Location IFC 1-10Hz  to L knee x 15 min to tolerance   Electrical Stimulation Goals Pain;Edema     Vasopneumatic   Number  Minutes Vasopneumatic  15 minutes   Vasopnuematic Location  Knee   Vasopneumatic Pressure Medium   Vasopneumatic Temperature  36                     PT Long Term Goals - 09/04/16 1341      PT LONG TERM GOAL #1   Title I with HEP   Time 4   Period Weeks   Status Achieved     PT LONG TERM GOAL #2   Title decreased pain with ADLS to [id number] or less.   Time 4   Period Weeks   Status On-going     PT LONG TERM GOAL #3   Title Full knee extension to normalize gait.   Time 4   Period Weeks   Status Achieved  AROM L knee measured as 0-125 deg 09/04/2016     PT LONG TERM GOAL #4   Title demo 4+/5 L hip and knee strength to improve standing/walking tolerance   Time 4   Period Weeks   Status On-going               Plan - 09/07/16 1310    PT Frequency 2x / week   PT Duration 4 weeks   PT Treatment/Interventions ADLs/Self Care Home Management;Cryotherapy;Pharmacist, hospital;Therapeutic exercise;Patient/family education;Neuromuscular re-education;Balance training;Manual techniques;Vasopneumatic Device;Passive range of motion   PT Next Visit Plan L knee and hip strengthening, ROM, modalities for pain.   PT Home Exercise Plan quad/HS set, SAQ, SLR, heel slide   Consulted and Agree with Plan of Care Patient      Patient will benefit from skilled therapeutic intervention in order to improve the following deficits and impairments:  Abnormal gait, Pain, Postural dysfunction, Decreased activity tolerance, Decreased strength, Increased edema  Visit Diagnosis: Stiffness of left knee, not elsewhere classified  Pain in left knee     Problem List Patient Active Problem List   Diagnosis Date Noted  . Acute confusional state 07/01/2016  . Acute renal failure ([place]) 06/29/2016  . Sinus tachycardia ([place]) 06/29/2016  . Transaminitis 03/10/2016  . Pelvic mass 01/27/2016  . Periumbilical abdominal pain 01/27/2016  . Colonic ulcer   .  Diverticulosis of colon without hemorrhage   . Iron deficiency anemia due to chronic blood loss   . Lumbar pain 12/14/2015  . Chronic kidney disease (CKD), stage II (mild) 10/03/2012  . Carotid artery narrowing 10/03/2012  . Hypothyroidism 10/02/2012  . Essential hypertension 01/09/2012  . Hyperlipidemia 01/09/2012  . Anemia 01/09/2012  . B12 deficiency 01/09/2012    RAMSEUR,CHRIS, PTA 09/07/2016, 2:18 PM  [facility] [place]  [place], [place], [address] Phone: [phone]   Fax:  [phone]  Name: [name] MRN: [id number] Date of Birth: 03-Oct-1946

## 2016-09-07 NOTE — Therapy (Signed)
[facility] [place], [place], [address] Phone: [phone]   Fax:  [phone]  Physical Therapy Treatment  Patient Details  Name: [name] MRN: [id number] Date of Birth: 1946/01/21 Referring Provider: Latanya Maudlin, MD  Encounter Date: 09/07/2016      PT End of Session - 09/07/16 1310    Visit Number 4   Number of Visits 8   Date for PT Re-Evaluation 09/25/16   PT Start Time 1300   PT Stop Time [id number]   PT Time Calculation (min) 52 min      Past Medical History:  Diagnosis Date  . Alcoholism ([place]) 01/09/2012  . Anemia   . Anxiety   . B12 deficiency   . Carotid artery narrowing 10/03/2012   On the right.  . Chronic kidney disease (CKD), stage II (mild)   . COPD (chronic obstructive pulmonary disease) ([place])   . Depression   . Headache(784.0)   . Hyperlipidemia   . Hypertension   . Hyponatremia 01/09/2012  . Hypothyroid   . Menopause   . Mini stroke ([place])   . Orthostatic syncope 10/02/2012  . Pneumonia   . SBO (small bowel obstruction) ([place]) 06/28/2016  . Shortness of breath   . Stroke [facility]) 2010   no deficits    Past Surgical History:  Procedure Laterality Date  . AGILE CAPSULE N/A 02/22/2016   dummy capsule remained in the distal ileum  . APPENDECTOMY    . CATARACT EXTRACTION W/PHACO  06/20/2012   Procedure: CATARACT EXTRACTION PHACO AND INTRAOCULAR LENS PLACEMENT (IOC);  Surgeon: Tonny Branch, MD;  Location: [facility];  Service: Ophthalmology;  Laterality: Right;  CDE:10.66  . CATARACT EXTRACTION W/PHACO  07/11/2012   Procedure: CATARACT EXTRACTION PHACO AND INTRAOCULAR LENS PLACEMENT (IOC);  Surgeon: Tonny Branch, MD;  Location: [facility];  Service: Ophthalmology;  Laterality: Left;  CDE: 12.69  . COLONOSCOPY N/A 12/17/2015   RMR: Marked circumferential ulceration of the ascending colon/Ileocecal valve ulceration with biopsy most consistent with NSAID injury versus inflammatory bowel disease, pancolonoc  diverticulosis redundant colon  . ESOPHAGOGASTRODUODENOSCOPY N/A 12/17/2015   [id number] gastric polyp, biopsy benign  . TONSILLECTOMY      There were no vitals filed for this visit.      Subjective Assessment - 09/07/16 1307    Subjective Reports that she doesn't understand why the lower part of L leg burns or hurts.   Pertinent History HTN, COPD, CVA affecting L side (2015)   Limitations Standing;Walking   How long can you stand comfortably? 10 min   How long can you walk comfortably? 10 min   Diagnostic tests xray shows anterior compression L1-3 and Gd 1 anterolisthesis of L3 on L4   Patient Stated Goals to get strength back in her leg and be able to walk   Currently in Pain? Yes   Pain Score 7    Pain Location Leg   Pain Orientation Left   Pain Descriptors / Indicators Sharp   Pain Type Surgical pain   Pain Onset 1 to 4 weeks ago   Pain Frequency Intermittent                         [facility] Adult PT Treatment/Exercise - 09/07/16 0001      Exercises   Exercises Knee/Hip     Knee/Hip Exercises: Stretches   Active Hamstring Stretch Left;3 reps;30 seconds     Knee/Hip Exercises: Aerobic   Recumbent Bike 0 resistance  x 10 mins     Knee/Hip Exercises: Standing   Heel Raises Both;2 sets;10 reps   Heel Raises Limitations B toe raise x20 reps   Hip Flexion AROM;10 reps;Knee bent;Both;2 sets  marching     Rocker Board 5 minutes  calf stretching     Knee/Hip Exercises: Seated   Long Arc Quad Strengthening;Left;10 reps;Weights;3 sets   Long Arc Quad Weight 2 lbs.     Knee/Hip Exercises: Supine   Straight Leg Raises Strengthening;Left;10 reps;3 sets     Knee/Hip Exercises: Sidelying   Hip ABduction Strengthening;Left;10 reps;3 sets     Modalities   Modalities Vasopneumatic     Electrical Stimulation   Electrical Stimulation Location IFC 1-10Hz  to L knee x 15 min to tolerance   Electrical Stimulation Goals Pain;Edema     Vasopneumatic   Number  Minutes Vasopneumatic  15 minutes   Vasopnuematic Location  Knee   Vasopneumatic Pressure Medium   Vasopneumatic Temperature  36                     PT Long Term Goals - 09/04/16 1341      PT LONG TERM GOAL #1   Title I with HEP   Time 4   Period Weeks   Status Achieved     PT LONG TERM GOAL #2   Title decreased pain with ADLS to [id number] or less.   Time 4   Period Weeks   Status On-going     PT LONG TERM GOAL #3   Title Full knee extension to normalize gait.   Time 4   Period Weeks   Status Achieved  AROM L knee measured as 0-125 deg 09/04/2016     PT LONG TERM GOAL #4   Title demo 4+/5 L hip and knee strength to improve standing/walking tolerance   Time 4   Period Weeks   Status On-going               Plan - 09/07/16 1310    Clinical Impression Statement Pt did fairly well with Rx today. She was able to perform the bike today and make full revollutions with minimal discomfort as well as other exs. Her PROM was still 0-125 degrees LT knee, but still with some avtive extensor lag.      PT Frequency 2x / week   PT Duration 4 weeks   PT Treatment/Interventions ADLs/Self Care Home Management;Cryotherapy;Pharmacist, hospital;Therapeutic exercise;Patient/family education;Neuromuscular re-education;Balance training;Manual techniques;Vasopneumatic Device;Passive range of motion   PT Next Visit Plan L knee and hip strengthening, ROM, modalities for pain.   PT Home Exercise Plan quad/HS set, SAQ, SLR, heel slide   Consulted and Agree with Plan of Care Patient      Patient will benefit from skilled therapeutic intervention in order to improve the following deficits and impairments:  Abnormal gait, Pain, Postural dysfunction, Decreased activity tolerance, Decreased strength, Increased edema  Visit Diagnosis: Stiffness of left knee, not elsewhere classified  Pain in left knee     Problem List Patient Active Problem List    Diagnosis Date Noted  . Acute confusional state 07/01/2016  . Acute renal failure ([place]) 06/29/2016  . Sinus tachycardia ([place]) 06/29/2016  . Transaminitis 03/10/2016  . Pelvic mass 01/27/2016  . Periumbilical abdominal pain 01/27/2016  . Colonic ulcer   . Diverticulosis of colon without hemorrhage   . Iron deficiency anemia due to chronic blood loss   . Lumbar pain 12/14/2015  . Chronic kidney disease (CKD),  stage II (mild) 10/03/2012  . Carotid artery narrowing 10/03/2012  . Hypothyroidism 10/02/2012  . Essential hypertension 01/09/2012  . Hyperlipidemia 01/09/2012  . Anemia 01/09/2012  . B12 deficiency 01/09/2012    RAMSEUR,CHRIS, PTA 09/07/2016, 2:28 PM  [facility] [address] [place], [place], [address] Phone: [phone]   Fax:  [phone]  Name: [name] MRN: [id number] Date of Birth: 15-Jan-1946

## 2016-09-10 HISTORY — PX: KNEE ARTHROSCOPY: SHX127

## 2016-09-11 ENCOUNTER — Encounter: Payer: Self-pay | Admitting: Physical Therapy

## 2016-09-11 ENCOUNTER — Ambulatory Visit: Payer: Medicare Other | Attending: Orthopedic Surgery | Admitting: Physical Therapy

## 2016-09-11 ENCOUNTER — Ambulatory Visit (INDEPENDENT_AMBULATORY_CARE_PROVIDER_SITE_OTHER): Payer: Medicare Other

## 2016-09-11 DIAGNOSIS — M25562 Pain in left knee: Secondary | ICD-10-CM | POA: Diagnosis not present

## 2016-09-11 DIAGNOSIS — M25662 Stiffness of left knee, not elsewhere classified: Secondary | ICD-10-CM | POA: Insufficient documentation

## 2016-09-11 DIAGNOSIS — Z23 Encounter for immunization: Secondary | ICD-10-CM | POA: Diagnosis not present

## 2016-09-11 NOTE — Therapy (Signed)
[facility] [place], [place], [address] Phone: [phone]   Fax:  [phone]  Physical Therapy Treatment  Patient Details  Name: [name] MRN: [id number] Date of Birth: 08-30-1946 Referring Provider: Latanya Maudlin, MD  Encounter Date: 09/11/2016      PT End of Session - 09/11/16 1343    Visit Number 5   Number of Visits 8   Date for PT Re-Evaluation 09/25/16   PT Start Time 0100   PT Stop Time 0150   PT Time Calculation (min) 50 min   Activity Tolerance Patient tolerated treatment well;Patient limited by pain   Behavior During Therapy [facility] for tasks assessed/performed      Past Medical History:  Diagnosis Date  . Alcoholism ([place]) 01/09/2012  . Anemia   . Anxiety   . B12 deficiency   . Carotid artery narrowing 10/03/2012   On the right.  . Chronic kidney disease (CKD), stage II (mild)   . COPD (chronic obstructive pulmonary disease) ([place])   . Depression   . Headache(784.0)   . Hyperlipidemia   . Hypertension   . Hyponatremia 01/09/2012  . Hypothyroid   . Menopause   . Mini stroke ([place])   . Orthostatic syncope 10/02/2012  . Pneumonia   . SBO (small bowel obstruction) 06/28/2016  . Shortness of breath   . Stroke [facility]) 2010   no deficits    Past Surgical History:  Procedure Laterality Date  . AGILE CAPSULE N/A 02/22/2016   dummy capsule remained in the distal ileum  . APPENDECTOMY    . CATARACT EXTRACTION W/PHACO  06/20/2012   Procedure: CATARACT EXTRACTION PHACO AND INTRAOCULAR LENS PLACEMENT (IOC);  Surgeon: Tonny Branch, MD;  Location: [facility];  Service: Ophthalmology;  Laterality: Right;  CDE:10.66  . CATARACT EXTRACTION W/PHACO  07/11/2012   Procedure: CATARACT EXTRACTION PHACO AND INTRAOCULAR LENS PLACEMENT (IOC);  Surgeon: Tonny Branch, MD;  Location: [facility];  Service: Ophthalmology;  Laterality: Left;  CDE: 12.69  . COLONOSCOPY N/A 12/17/2015   RMR: Marked circumferential ulceration of the ascending  colon/Ileocecal valve ulceration with biopsy most consistent with NSAID injury versus inflammatory bowel disease, pancolonoc diverticulosis redundant colon  . ESOPHAGOGASTRODUODENOSCOPY N/A 12/17/2015   [id number] gastric polyp, biopsy benign  . TONSILLECTOMY      There were no vitals filed for this visit.      Subjective Assessment - 09/11/16 1310    Subjective My pain is a "12" today.  I think it is due to stress from watching TV about what happened in [place].   Pain Score 10-Worst pain ever   Pain Location Leg   Pain Orientation Left   Pain Descriptors / Indicators Sharp   Pain Type Surgical pain   Pain Onset 1 to 4 weeks ago    Treatment:  Non-resisted bike x 10 minutes f/b STW/M to patient's right knee (especially medial joint line region f/b IFC and HMP x 15 minutes.  Patient enjoyed treatment.                                  PT Long Term Goals - 09/04/16 1341      PT LONG TERM GOAL #1   Title I with HEP   Time 4   Period Weeks   Status Achieved     PT LONG TERM GOAL #2   Title decreased pain with ADLS to [id number] or less.  Time 4   Period Weeks   Status On-going     PT LONG TERM GOAL #3   Title Full knee extension to normalize gait.   Time 4   Period Weeks   Status Achieved  AROM L knee measured as 0-125 deg 09/04/2016     PT LONG TERM GOAL #4   Title demo 4+/5 L hip and knee strength to improve standing/walking tolerance   Time 4   Period Weeks   Status On-going             Patient will benefit from skilled therapeutic intervention in order to improve the following deficits and impairments:  Abnormal gait, Pain, Postural dysfunction, Decreased activity tolerance, Decreased strength, Increased edema  Visit Diagnosis: Stiffness of left knee, not elsewhere classified  Acute pain of left knee     Problem List Patient Active Problem List   Diagnosis Date Noted  . Acute confusional state 07/01/2016  . Acute  renal failure ([place]) 06/29/2016  . Sinus tachycardia 06/29/2016  . Transaminitis 03/10/2016  . Pelvic mass 01/27/2016  . Periumbilical abdominal pain 01/27/2016  . Colonic ulcer   . Diverticulosis of colon without hemorrhage   . Iron deficiency anemia due to chronic blood loss   . Lumbar pain 12/14/2015  . Chronic kidney disease (CKD), stage II (mild) 10/03/2012  . Carotid artery narrowing 10/03/2012  . Hypothyroidism 10/02/2012  . Essential hypertension 01/09/2012  . Hyperlipidemia 01/09/2012  . Anemia 01/09/2012  . B12 deficiency 01/09/2012    [place] Minor, [place] 09/11/2016, 2:18 PM  [facility] [address] [place], [place], [address] Phone: [phone]   Fax:  [phone]  Name: [name] MRN: [id number] Date of Birth: December 20, 1945

## 2016-09-14 ENCOUNTER — Ambulatory Visit: Payer: Medicare Other | Admitting: *Deleted

## 2016-09-14 DIAGNOSIS — M25662 Stiffness of left knee, not elsewhere classified: Secondary | ICD-10-CM | POA: Diagnosis not present

## 2016-09-14 DIAGNOSIS — M25562 Pain in left knee: Secondary | ICD-10-CM | POA: Diagnosis not present

## 2016-09-14 NOTE — Therapy (Signed)
[facility] [place], [place], [address] Phone: [phone]   Fax:  [phone]  Physical Therapy Treatment  Patient Details  Name: [name] MRN: [id number] Date of Birth: 11-13-1946 Referring Provider: Latanya Maudlin, MD  Encounter Date: 09/14/2016      PT End of Session - 09/14/16 1309    Visit Number 6   Number of Visits 8   Date for PT Re-Evaluation 09/25/16   PT Start Time 1300   PT Stop Time [id number]   PT Time Calculation (min) 52 min      Past Medical History:  Diagnosis Date  . Alcoholism ([place]) 01/09/2012  . Anemia   . Anxiety   . B12 deficiency   . Carotid artery narrowing 10/03/2012   On the right.  . Chronic kidney disease (CKD), stage II (mild)   . COPD (chronic obstructive pulmonary disease) ([place])   . Depression   . Headache(784.0)   . Hyperlipidemia   . Hypertension   . Hyponatremia 01/09/2012  . Hypothyroid   . Menopause   . Mini stroke ([place])   . Orthostatic syncope 10/02/2012  . Pneumonia   . SBO (small bowel obstruction) 06/28/2016  . Shortness of breath   . Stroke [facility]) 2010   no deficits    Past Surgical History:  Procedure Laterality Date  . AGILE CAPSULE N/A 02/22/2016   dummy capsule remained in the distal ileum  . APPENDECTOMY    . CATARACT EXTRACTION W/PHACO  06/20/2012   Procedure: CATARACT EXTRACTION PHACO AND INTRAOCULAR LENS PLACEMENT (IOC);  Surgeon: Tonny Branch, MD;  Location: [facility];  Service: Ophthalmology;  Laterality: Right;  CDE:10.66  . CATARACT EXTRACTION W/PHACO  07/11/2012   Procedure: CATARACT EXTRACTION PHACO AND INTRAOCULAR LENS PLACEMENT (IOC);  Surgeon: Tonny Branch, MD;  Location: [facility];  Service: Ophthalmology;  Laterality: Left;  CDE: 12.69  . COLONOSCOPY N/A 12/17/2015   RMR: Marked circumferential ulceration of the ascending colon/Ileocecal valve ulceration with biopsy most consistent with NSAID injury versus inflammatory bowel disease, pancolonoc  diverticulosis redundant colon  . ESOPHAGOGASTRODUODENOSCOPY N/A 12/17/2015   [id number] gastric polyp, biopsy benign  . TONSILLECTOMY      There were no vitals filed for this visit.      Subjective Assessment - 09/14/16 1306    Subjective LT knee pain when walking 10/10. minimal pain at rest   Pertinent History HTN, COPD, CVA affecting L side (2015)   Limitations Standing;Walking   How long can you stand comfortably? 10 min   How long can you walk comfortably? 10 min   Diagnostic tests xray shows anterior compression L1-3 and Gd 1 anterolisthesis of L3 on L4   Patient Stated Goals to get strength back in her leg and be able to walk   Currently in Pain? Yes   Pain Score 10-Worst pain ever   Pain Location Leg   Pain Orientation Left   Pain Descriptors / Indicators Sharp   Pain Type Surgical pain   Pain Onset 1 to 4 weeks ago   Pain Frequency Intermittent                         [facility] Adult PT Treatment/Exercise - 09/14/16 0001      Exercises   Exercises Knee/Hip     Knee/Hip Exercises: Aerobic   Recumbent Bike 0 resistance x 10 mins     Knee/Hip Exercises: Standing   Rocker Board 5 minutes  Knee/Hip Exercises: Seated   Long Arc Quad Strengthening;Left;10 reps;Weights;3 sets   [employer] Weight 2 lbs.     Modalities   Modalities Vasopneumatic;Electrical Stimulation;Ultrasound     Acupuncturist Location IFC 1-10Hz  to L knee x 15 min to tolerance   Electrical Stimulation Goals Pain;Edema     Ultrasound   Ultrasound Location LT knee medial Jt line   Ultrasound Parameters Combo  1.2 w/cm2 x 8 mins   Ultrasound Goals Pain     Vasopneumatic   Number Minutes Vasopneumatic  15 minutes   Vasopnuematic Location  Knee   Vasopneumatic Pressure Medium   Vasopneumatic Temperature  36                     PT Long Term Goals - 09/04/16 1341      PT LONG TERM GOAL #1   Title I with HEP   Time 4    Period Weeks   Status Achieved     PT LONG TERM GOAL #2   Title decreased pain with ADLS to [id number] or less.   Time 4   Period Weeks   Status On-going     PT LONG TERM GOAL #3   Title Full knee extension to normalize gait.   Time 4   Period Weeks   Status Achieved  AROM L knee measured as 0-125 deg 09/04/2016     PT LONG TERM GOAL #4   Title demo 4+/5 L hip and knee strength to improve standing/walking tolerance   Time 4   Period Weeks   Status On-going               Plan - 09/14/16 1317    Clinical Impression Statement Pt did better with Rx today and was able to perform some LT therex with minimal pain increase. US/ combo was performed medial Jt line LT knee with good response. Her PROM was 0-125 degrees again ,but still with some AROM extension lag. Goals are ongoing   PT Frequency 2x / week   PT Duration 4 weeks   PT Treatment/Interventions ADLs/Self Care Home Management;Cryotherapy;Pharmacist, hospital;Therapeutic exercise;Patient/family education;Neuromuscular re-education;Balance training;Manual techniques;Vasopneumatic Device;Passive range of motion   PT Next Visit Plan L knee and hip strengthening, ROM, modalities for pain.   PT Home Exercise Plan quad/HS set, SAQ, SLR, heel slide   Consulted and Agree with Plan of Care Patient      Patient will benefit from skilled therapeutic intervention in order to improve the following deficits and impairments:  Abnormal gait, Pain, Postural dysfunction, Decreased activity tolerance, Decreased strength, Increased edema  Visit Diagnosis: Stiffness of left knee, not elsewhere classified  Acute pain of left knee     Problem List Patient Active Problem List   Diagnosis Date Noted  . Acute confusional state 07/01/2016  . Acute renal failure ([place]) 06/29/2016  . Sinus tachycardia 06/29/2016  . Transaminitis 03/10/2016  . Pelvic mass 01/27/2016  . Periumbilical abdominal pain 01/27/2016  .  Colonic ulcer   . Diverticulosis of colon without hemorrhage   . Iron deficiency anemia due to chronic blood loss   . Lumbar pain 12/14/2015  . Chronic kidney disease (CKD), stage II (mild) 10/03/2012  . Carotid artery narrowing 10/03/2012  . Hypothyroidism 10/02/2012  . Essential hypertension 01/09/2012  . Hyperlipidemia 01/09/2012  . Anemia 01/09/2012  . B12 deficiency 01/09/2012    Jeniel Slauson,CHRIS, PTA 09/14/2016, 4:57 PM  [facility] [place]  [place], [place], [address] Phone: [phone]   Fax:  [phone]  Name: [name] MRN: [id number] Date of Birth: 1946-10-11

## 2016-09-18 ENCOUNTER — Ambulatory Visit: Payer: Medicare Other | Admitting: Physical Therapy

## 2016-09-19 ENCOUNTER — Ambulatory Visit: Payer: Medicare Other | Admitting: Physical Therapy

## 2016-09-19 DIAGNOSIS — M25562 Pain in left knee: Secondary | ICD-10-CM

## 2016-09-19 DIAGNOSIS — M25662 Stiffness of left knee, not elsewhere classified: Secondary | ICD-10-CM | POA: Diagnosis not present

## 2016-09-19 NOTE — Therapy (Signed)
[facility] [place], [place], [address] Phone: [phone]   Fax:  [phone]  Physical Therapy Treatment  Patient Details  Name: [name] MRN: [id number] Date of Birth: 08-11-1946 Referring Provider: Latanya Maudlin, MD  Encounter Date: 09/19/2016      PT End of Session - 09/19/16 1415    Visit Number 7   Number of Visits 8   Date for PT Re-Evaluation 09/25/16   PT Start Time 0145   Activity Tolerance Patient limited by pain   Behavior During Therapy [facility] for tasks assessed/performed      Past Medical History:  Diagnosis Date  . Alcoholism ([place]) 01/09/2012  . Anemia   . Anxiety   . B12 deficiency   . Carotid artery narrowing 10/03/2012   On the right.  . Chronic kidney disease (CKD), stage II (mild)   . COPD (chronic obstructive pulmonary disease) ([place])   . Depression   . Headache(784.0)   . Hyperlipidemia   . Hypertension   . Hyponatremia 01/09/2012  . Hypothyroid   . Menopause   . Mini stroke ([place])   . Orthostatic syncope 10/02/2012  . Pneumonia   . SBO (small bowel obstruction) 06/28/2016  . Shortness of breath   . Stroke [facility]) 2010   no deficits    Past Surgical History:  Procedure Laterality Date  . AGILE CAPSULE N/A 02/22/2016   dummy capsule remained in the distal ileum  . APPENDECTOMY    . CATARACT EXTRACTION W/PHACO  06/20/2012   Procedure: CATARACT EXTRACTION PHACO AND INTRAOCULAR LENS PLACEMENT (IOC);  Surgeon: Tonny Branch, MD;  Location: [facility];  Service: Ophthalmology;  Laterality: Right;  CDE:10.66  . CATARACT EXTRACTION W/PHACO  07/11/2012   Procedure: CATARACT EXTRACTION PHACO AND INTRAOCULAR LENS PLACEMENT (IOC);  Surgeon: Tonny Branch, MD;  Location: [facility];  Service: Ophthalmology;  Laterality: Left;  CDE: 12.69  . COLONOSCOPY N/A 12/17/2015   RMR: Marked circumferential ulceration of the ascending colon/Ileocecal valve ulceration with biopsy most consistent with NSAID injury versus  inflammatory bowel disease, pancolonoc diverticulosis redundant colon  . ESOPHAGOGASTRODUODENOSCOPY N/A 12/17/2015   [id number] gastric polyp, biopsy benign  . TONSILLECTOMY      There were no vitals filed for this visit.      Subjective Assessment - 09/19/16 1407    Subjective The patient reports she twisted when her left knee was planted and heard a loud 'pop".  Her pain is high today.   Pain Score 9    Pain Location Knee   Pain Descriptors / Indicators Aching;Throbbing;Sharp   Pain Type Surgical pain   Pain Onset 1 to 4 weeks ago                         [facility] Adult PT Treatment/Exercise - 09/19/16 0001      Exercises   Exercises Knee/Hip     Knee/Hip Exercises: Aerobic   Recumbent Bike Nustep level 2 at a slow cadence for gentle range of motion x 10 minutes.     Acupuncturist Stimulation Location IFC at 1-10 Hz x 20 minutes.   Electrical Stimulation Goals Edema;Pain     Vasopneumatic   Number Minutes Vasopneumatic  15 minutes   Vasopnuematic Location  --  Left knee.   Vasopneumatic Pressure Medium                     PT Long Term Goals - 09/04/16 1341  PT LONG TERM GOAL #1   Title I with HEP   Time 4   Period Weeks   Status Achieved     PT LONG TERM GOAL #2   Title decreased pain with ADLS to [id number] or less.   Time 4   Period Weeks   Status On-going     PT LONG TERM GOAL #3   Title Full knee extension to normalize gait.   Time 4   Period Weeks   Status Achieved  AROM L knee measured as 0-125 deg 09/04/2016     PT LONG TERM GOAL #4   Title demo 4+/5 L hip and knee strength to improve standing/walking tolerance   Time 4   Period Weeks   Status On-going               Plan - 09/19/16 1416    Clinical Impression Statement The patient had a severe flare-up at home last night when she twisted on her knee when it was planted.  She heard an audible "pop".  Her edema is 1 cm gretaer than when  assessed at the time of her initial evaluation.  Her AROM of her left knee today was as follows:  -15 to 100 degrees.      Patient will benefit from skilled therapeutic intervention in order to improve the following deficits and impairments:     Visit Diagnosis: Stiffness of left knee, not elsewhere classified  Acute pain of left knee     Problem List Patient Active Problem List   Diagnosis Date Noted  . Acute confusional state 07/01/2016  . Acute renal failure ([place]) 06/29/2016  . Sinus tachycardia 06/29/2016  . Transaminitis 03/10/2016  . Pelvic mass 01/27/2016  . Periumbilical abdominal pain 01/27/2016  . Colonic ulcer   . Diverticulosis of colon without hemorrhage   . Iron deficiency anemia due to chronic blood loss   . Lumbar pain 12/14/2015  . Chronic kidney disease (CKD), stage II (mild) 10/03/2012  . Carotid artery narrowing 10/03/2012  . Hypothyroidism 10/02/2012  . Essential hypertension 01/09/2012  . Hyperlipidemia 01/09/2012  . Anemia 01/09/2012  . B12 deficiency 01/09/2012    [place], [place] MPT 09/19/2016, 2:34 PM  [facility] [address] [place], [place], [address] Phone: [phone]   Fax:  [phone]  Name: [name] MRN: [id number] Date of Birth: [birth year]

## 2016-09-21 ENCOUNTER — Ambulatory Visit: Payer: Medicare Other | Admitting: *Deleted

## 2016-09-21 DIAGNOSIS — M25562 Pain in left knee: Secondary | ICD-10-CM

## 2016-09-21 DIAGNOSIS — M25662 Stiffness of left knee, not elsewhere classified: Secondary | ICD-10-CM | POA: Diagnosis not present

## 2016-09-21 NOTE — Therapy (Addendum)
[facility] [place], [place], [address] Phone: [phone]   Fax:  [phone]  Physical Therapy Treatment  Patient Details  Name: [name] MRN: [id number] Date of Birth: 1946-12-09 Referring Provider: Latanya Maudlin, MD  Encounter Date: 09/21/2016      PT End of Session - 09/21/16 1339    Visit Number 8   Number of Visits 8   Date for PT Re-Evaluation 09/25/16   PT Start Time 1300   PT Stop Time 1351   PT Time Calculation (min) 51 min      Past Medical History:  Diagnosis Date  . Alcoholism ([place]) 01/09/2012  . Anemia   . Anxiety   . B12 deficiency   . Carotid artery narrowing 10/03/2012   On the right.  . Chronic kidney disease (CKD), stage II (mild)   . COPD (chronic obstructive pulmonary disease) ([place])   . Depression   . Headache(784.0)   . Hyperlipidemia   . Hypertension   . Hyponatremia 01/09/2012  . Hypothyroid   . Menopause   . Mini stroke ([place])   . Orthostatic syncope 10/02/2012  . Pneumonia   . SBO (small bowel obstruction) 06/28/2016  . Shortness of breath   . Stroke [facility]) 2010   no deficits    Past Surgical History:  Procedure Laterality Date  . AGILE CAPSULE N/A 02/22/2016   dummy capsule remained in the distal ileum  . APPENDECTOMY    . CATARACT EXTRACTION W/PHACO  06/20/2012   Procedure: CATARACT EXTRACTION PHACO AND INTRAOCULAR LENS PLACEMENT (IOC);  Surgeon: Tonny Branch, MD;  Location: [facility];  Service: Ophthalmology;  Laterality: Right;  CDE:10.66  . CATARACT EXTRACTION W/PHACO  07/11/2012   Procedure: CATARACT EXTRACTION PHACO AND INTRAOCULAR LENS PLACEMENT (IOC);  Surgeon: Tonny Branch, MD;  Location: [facility];  Service: Ophthalmology;  Laterality: Left;  CDE: 12.69  . COLONOSCOPY N/A 12/17/2015   RMR: Marked circumferential ulceration of the ascending colon/Ileocecal valve ulceration with biopsy most consistent with NSAID injury versus inflammatory bowel disease, pancolonoc  diverticulosis redundant colon  . ESOPHAGOGASTRODUODENOSCOPY N/A 12/17/2015   [id number] gastric polyp, biopsy benign  . TONSILLECTOMY      There were no vitals filed for this visit.      Subjective Assessment - 09/21/16 1311    Subjective The patient reports she twisted when her left knee was planted and heard a loud 'pop" a few days ago.  Swelling and pain is less today 8/10   Pertinent History HTN, COPD, CVA affecting L side (2015)   Limitations Standing;Walking   How long can you stand comfortably? 10 min   How long can you walk comfortably? 10 min   Diagnostic tests xray shows anterior compression L1-3 and Gd 1 anterolisthesis of L3 on L4   Patient Stated Goals to get strength back in her leg and be able to walk   Currently in Pain? Yes   Pain Score 8    Pain Location Knee   Pain Orientation Left   Pain Descriptors / Indicators Aching   Pain Type Surgical pain   Pain Onset 1 to 4 weeks ago                         [facility] Adult PT Treatment/Exercise - 09/21/16 0001      Exercises   Exercises Knee/Hip     Knee/Hip Exercises: Aerobic   Recumbent Bike Nustep level 2 at a slow cadence for gentle range  of motion x 12 minutes.     Knee/Hip Exercises: Supine   Quad Sets AROM  with VMS     Modalities   Modalities Vasopneumatic;Electrical Stimulation;Ultrasound     Electrical Stimulation   Electrical Stimulation Location IFC at 1-10 Hz x 20 minutes.   Electrical Stimulation Action VMS x10 min 10 sec on/off with Associate Professor Goals Edema;Pain     Vasopneumatic   Number Minutes Vasopneumatic  15 minutes   Vasopnuematic Location  Knee   Vasopneumatic Pressure Medium   Vasopneumatic Temperature  36                     PT Long Term Goals - 09/04/16 1341      PT LONG TERM GOAL #1   Title I with HEP   Time 4   Period Weeks   Status Achieved     PT LONG TERM GOAL #2   Title decreased pain with ADLS to 0-2/72 or less.    Time 4   Period Weeks   Status On-going     PT LONG TERM GOAL #3   Title Full knee extension to normalize gait.   Time 4   Period Weeks   Status Achieved  AROM L knee measured as 0-125 deg 09/04/2016     PT LONG TERM GOAL #4   Title demo 4+/5 L hip and knee strength to improve standing/walking tolerance   Time 4   Period Weeks   Status On-going               Plan - 09/21/16 1339    Clinical Impression Statement Pt did a little  better today than last visit. Her LT knee had less pain today, but was still swollen. She has lost ROM this week 10-105 degrees compared to last week 0-125 degrees due to recent  twist and POP. She was able to perform Quadsts with VMS with minimal pain increase. Pt to call MD for F/U appt.    Rehab Potential Excellent   PT Frequency 2x / week   PT Duration 4 weeks   PT Treatment/Interventions ADLs/Self Care Home Management;Cryotherapy;Pharmacist, hospital;Therapeutic exercise;Patient/family education;Neuromuscular re-education;Balance training;Manual techniques;Vasopneumatic Device;Passive range of motion   PT Next Visit Plan L knee and hip strengthening, ROM, modalities for pain.     Recert due. Routed toMPT   PT Home Exercise Plan quad/HS set, SAQ, SLR, heel slide   Consulted and Agree with Plan of Care Patient      Patient will benefit from skilled therapeutic intervention in order to improve the following deficits and impairments:  Abnormal gait, Pain, Postural dysfunction, Decreased activity tolerance, Decreased strength, Increased edema  Visit Diagnosis: Stiffness of left knee, not elsewhere classified  Acute pain of left knee     Problem List Patient Active Problem List   Diagnosis Date Noted  . Acute confusional state 07/01/2016  . Acute renal failure ([place]) 06/29/2016  . Sinus tachycardia 06/29/2016  . Transaminitis 03/10/2016  . Pelvic mass 01/27/2016  . Periumbilical abdominal pain 01/27/2016  .  Colonic ulcer   . Diverticulosis of colon without hemorrhage   . Iron deficiency anemia due to chronic blood loss   . Lumbar pain 12/14/2015  . Chronic kidney disease (CKD), stage II (mild) 10/03/2012  . Carotid artery narrowing 10/03/2012  . Hypothyroidism 10/02/2012  . Essential hypertension 01/09/2012  . Hyperlipidemia 01/09/2012  . Anemia 01/09/2012  . B12 deficiency 01/09/2012    Muriel Wilber,CHRIS 09/21/2016, 2:08  PM  [facility] [place], [place], [address] Phone: [phone]   Fax:  [phone]  Name: [name] MRN: [id number] Date of Birth: 27-Jul-1946 PHYSICAL THERAPY DISCHARGE SUMMARY  Visits from Start of Care: 8.  Current functional level related to goals / functional outcomes: See above.   Remaining deficits: Continued knee pain.   Education / Equipment: HEP. Plan: Patient agrees to discharge.  Patient goals were partially met. Patient is being discharged due to not returning since the last visit.  ?????        [place] App[place] MPT

## 2016-09-23 DIAGNOSIS — M25562 Pain in left knee: Secondary | ICD-10-CM | POA: Diagnosis not present

## 2016-09-23 DIAGNOSIS — M1712 Unilateral primary osteoarthritis, left knee: Secondary | ICD-10-CM | POA: Diagnosis not present

## 2016-09-23 DIAGNOSIS — Z9889 Other specified postprocedural states: Secondary | ICD-10-CM | POA: Diagnosis not present

## 2016-10-02 ENCOUNTER — Encounter: Payer: Self-pay | Admitting: Family Medicine

## 2016-10-02 ENCOUNTER — Ambulatory Visit (INDEPENDENT_AMBULATORY_CARE_PROVIDER_SITE_OTHER): Payer: Medicare Other | Admitting: Family Medicine

## 2016-10-02 VITALS — BP 147/83 | HR 91 | Temp 97.3°F | Ht 69.0 in | Wt 160.0 lb

## 2016-10-02 DIAGNOSIS — R300 Dysuria: Secondary | ICD-10-CM

## 2016-10-02 LAB — URINALYSIS
Bilirubin, UA: NEGATIVE
Glucose, UA: NEGATIVE
Ketones, UA: NEGATIVE
Nitrite, UA: NEGATIVE
Protein, UA: NEGATIVE
RBC, UA: NEGATIVE
Specific Gravity, UA: <1.005 — ABNORMAL LOW (ref 1.005–1.030)
Urobilinogen, Ur: 0.2 mg/dL (ref 0.2–1.0)
pH, UA: 5.5 (ref 5.0–7.5)

## 2016-10-02 MED ORDER — NITROFURANTOIN MONOHYD MACRO 100 MG PO CAPS
100.0000 mg | ORAL_CAPSULE | Freq: Two times a day (BID) | ORAL | 0 refills | Status: DC
Start: 2016-10-02 — End: 2016-10-18

## 2016-10-02 NOTE — Progress Notes (Signed)
Subjective:  Patient ID: [name], female    DOB: [birth year]  Age: 70 y.o. MRN: [id number]  CC: Urinary Tract Infection (burning, odor, discomfort, tried AZO, yogurt, water, started about a week ago)   HPI [name] presents for burning with urination and frequency for several days. Denies fever . No flank pain. No nausea, vomiting.    History [name] has a past medical history of Alcoholism ([place]) (01/09/2012); Anemia; Anxiety; B12 deficiency; Carotid artery narrowing (10/03/2012); Chronic kidney disease (CKD), stage II (mild); COPD (chronic obstructive pulmonary disease) ([place]); Depression; Headache(784.0); Hyperlipidemia; Hypertension; Hyponatremia (01/09/2012); Hypothyroid; Menopause; Mini stroke ([place]); Orthostatic syncope (10/02/2012); Pneumonia; SBO (small bowel obstruction) (06/28/2016); Shortness of breath; and Stroke ([place]) (2010).   She has a past surgical history that includes Appendectomy; Tonsillectomy; Cataract extraction w/PHACO (06/20/2012); Cataract extraction w/PHACO (07/11/2012); Colonoscopy (N/A, 12/17/2015); Esophagogastroduodenoscopy (N/A, 12/17/2015); Agile capsule (N/A, 02/22/2016); and Knee arthroscopy (Right, 09/2016).   Her family history includes GER disease in her mother; Hypertension in her maternal grandmother.She reports that she quit smoking about 24 years ago. Her smoking use included Cigarettes. She has a 60.00 pack-year smoking history. She has never used smokeless tobacco. She reports that she does not drink alcohol or use drugs.    ROS Review of Systems  Constitutional: Negative for chills, diaphoresis and fever.  HENT: Negative for congestion.   Eyes: Negative for visual disturbance.  Respiratory: Negative for cough and shortness of breath.   Cardiovascular: Negative for chest pain and palpitations.  Gastrointestinal: Negative for constipation, diarrhea and nausea.  Genitourinary: Positive for dysuria, frequency and urgency. Negative for decreased  urine volume, flank pain, hematuria, menstrual problem and pelvic pain.  Musculoskeletal: Negative for arthralgias and joint swelling.  Skin: Negative for rash.  Neurological: Negative for dizziness and numbness.    Objective:  BP (!) 147/83   Pulse 91   Temp 97.3 F (36.3 C) (Oral)   Ht 5\' 9"  (1.753 m)   Wt 160 lb (72.6 kg)   LMP 08/21/2013   BMI 23.63 kg/m   BP Readings from Last 3 Encounters:  10/02/16 (!) 147/83  08/15/16 93/66  07/02/16 (!) 156/71    Wt Readings from Last 3 Encounters:  10/02/16 160 lb (72.6 kg)  08/15/16 161 lb 2 oz (73.1 kg)  06/29/16 145 lb 1 oz (65.8 kg)     Physical Exam  Constitutional: She is oriented to person, place, and time. She appears well-developed and well-nourished.  HENT:  Head: Normocephalic and atraumatic.  Cardiovascular: Normal rate and regular rhythm.   No murmur heard. Pulmonary/Chest: Effort normal and breath sounds normal.  Abdominal: Soft. Bowel sounds are normal. She exhibits no mass. There is no tenderness. There is no rebound and no guarding.  Musculoskeletal: She exhibits no tenderness.  Neurological: She is alert and oriented to person, place, and time.  Skin: Skin is warm and dry.  Psychiatric: She has a normal mood and affect. Her behavior is normal.     Lab Results  Component Value Date   WBC 6.8 08/15/2016   HGB 9.3 (L) 07/01/2016   HCT 41.6 08/15/2016   PLT 295 08/15/2016   GLUCOSE 63 (L) 08/15/2016   CHOL 233 (H) 08/15/2016   TRIG 209 (H) 08/15/2016   HDL 42 08/15/2016   LDLCALC 149 (H) 08/15/2016   ALT 6 08/15/2016   AST 8 08/15/2016   NA 142 08/15/2016   K 4.7 08/15/2016   CL 100 08/15/2016   CREATININE 1.57 (H) 08/15/2016  BUN 30 (H) 08/15/2016   CO2 23 08/15/2016   TSH 1.680 08/15/2016   INR 1.14 12/16/2015   HGBA1C 4.7 11/13/2013    Ct Abdomen Pelvis Wo Contrast  Result Date: 06/28/2016 CLINICAL DATA:  Worsening abdominal pain. EXAM: CT ABDOMEN AND PELVIS WITHOUT CONTRAST TECHNIQUE:  Multidetector CT imaging of the abdomen and pelvis was performed following the standard protocol without IV contrast. COMPARISON:  Abdomen radiograph dated 06/23/2016. Abdomen and pelvis CT dated 02/04/2016. FINDINGS: Lower chest: Minimal patchy atelectasis or scarring in the left lower lobe anteriorly. Hepatobiliary: Distended gallbladder without abnormal dilatation, wall thickening or visible gallstones. Normal non contrasted appearance of the liver. Pancreas: No mass or inflammatory process identified on this un-enhanced exam. Spleen: Within normal limits in size and appearance. Adrenals/Urinary Tract: Normal appearing adrenal glands, kidneys, ureters and urinary bladder. No calculi or hydronephrosis. No visible mass. Stomach/Bowel: Dilated, fluid-filled small bowel loops throughout the abdomen pelvis to the level of the terminal ileum. There is some matting of the small bowel loops at that location with some fecalization of the bowel contents. Normal caliber colon. Prominent stool in the rectum. No evidence of appendicitis. Small to moderate-sized sliding hiatal hernia with refluxed contrast in the distal esophagus. Vascular/Lymphatic: Atheromatous arterial calcifications, including the abdominal aorta. No enlarged lymph nodes. Reproductive: No mass or other significant abnormality. Other: None. Musculoskeletal: Lumbar spine degenerative changes at multiple levels. Mild anterior wedge deformities involving the L1, L2 and L3 vertebral bodies with minimal bony retropulsion. Mild diffuse compression deformity of the L5 vertebral body. No acute fracture lines. IMPRESSION: 1. Small bowel obstruction to the level of the terminal ileum were there is matting of the small bowel loops, suggesting adhesion formation. 2. Small to moderate-sized sliding hiatal hernia with gastroesophageal reflux. 3. Prominent stool in the rectum. 4. Aortic atherosclerosis. Electronically Signed   By: Claudie Revering M.D.   On: 06/28/2016 21:35     Dg Chest 2 View  Result Date: 06/28/2016 CLINICAL DATA:  70 y/o  F; vomiting and abdominal pain. EXAM: CHEST  2 VIEW COMPARISON:  None. FINDINGS: The heart size and mediastinal contours are within normal limits. Both lungs are clear. The visualized skeletal structures are unremarkable. Prominent loops of small bowel in the left upper quadrant with fluid levels may represent obstruction or ileus. Stable biapical scarring. IMPRESSION: No active cardiopulmonary disease. Prominent loops of small bowel in the left upper quadrant with fluid levels may represent obstruction or ileus. Electronically Signed   By: Kristine Garbe M.D.   On: 06/28/2016 19:53    Assessment & Plan:   [name] was seen today for urinary tract infection.  Diagnoses and all orders for this visit:  Dysuria -     Urinalysis -     Urine culture  Other orders -     nitrofurantoin, macrocrystal-monohydrate, (MACROBID) 100 MG capsule; Take 1 capsule (100 mg total) by mouth 2 (two) times daily.   I have discontinued Ms. Price's celecoxib. I am also having her start on nitrofurantoin (macrocrystal-monohydrate). Additionally, I am having her maintain her folic acid, aspirin EC, thiamine, Ferrous Fumarate, acetaminophen, valsartan, cyclobenzaprine, DULoxetine, levothyroxine, and predniSONE.  Meds ordered this encounter  Medications  . predniSONE (DELTASONE) 10 MG tablet    Sig: Dose pack  . nitrofurantoin, macrocrystal-monohydrate, (MACROBID) 100 MG capsule    Sig: Take 1 capsule (100 mg total) by mouth 2 (two) times daily.    Dispense:  14 capsule    Refill:  0     Follow-up:  No Follow-up on file.  Claretta Fraise, M.D.

## 2016-10-05 LAB — URINE CULTURE

## 2016-10-13 ENCOUNTER — Ambulatory Visit: Payer: Medicare Other | Admitting: Nurse Practitioner

## 2016-10-18 ENCOUNTER — Ambulatory Visit (INDEPENDENT_AMBULATORY_CARE_PROVIDER_SITE_OTHER): Payer: Medicare Other

## 2016-10-18 ENCOUNTER — Ambulatory Visit (INDEPENDENT_AMBULATORY_CARE_PROVIDER_SITE_OTHER): Payer: Medicare Other | Admitting: Family Medicine

## 2016-10-18 VITALS — BP 136/81 | HR 90 | Temp 98.6°F | Ht 69.0 in | Wt 161.0 lb

## 2016-10-18 DIAGNOSIS — S20211A Contusion of right front wall of thorax, initial encounter: Secondary | ICD-10-CM | POA: Diagnosis not present

## 2016-10-18 NOTE — Progress Notes (Signed)
Subjective:  Patient ID: [name], female    DOB: January 27, 1946  Age: 70 y.o. MRN: [id number]  CC: Chest Pain (rib pain, fell Friday evening, floor, right ribs, hip, forearm, bruised eye)   HPI [facility] presents for She fell to the floor. She did not feel dizzy. She just stumbled. There was no loss of consciousness. This occurred 5 days ago. Pain continues particularly at the right anterior lower chest. The right hip and forearms as well are painful. These are getting better. She had a shiner on the right eye. That has improved significantly.   History Alithia has a past medical history of Alcoholism ([place]) (01/09/2012); Anemia; Anxiety; B12 deficiency; Carotid artery narrowing (10/03/2012); Chronic kidney disease (CKD), stage II (mild); COPD (chronic obstructive pulmonary disease) ([place]); Depression; Headache(784.0); Hyperlipidemia; Hypertension; Hyponatremia (01/09/2012); Hypothyroid; Menopause; Mini stroke ([place]); Orthostatic syncope (10/02/2012); Pneumonia; SBO (small bowel obstruction) (06/28/2016); Shortness of breath; and Stroke ([place]) (2010).   She has a past surgical history that includes Appendectomy; Tonsillectomy; Cataract extraction w/PHACO (06/20/2012); Cataract extraction w/PHACO (07/11/2012); Colonoscopy (N/A, 12/17/2015); Esophagogastroduodenoscopy (N/A, 12/17/2015); Agile capsule (N/A, 02/22/2016); and Knee arthroscopy (Right, 09/2016).   Her family history includes GER disease in her mother; Hypertension in her maternal grandmother.She reports that she quit smoking about 24 years ago. Her smoking use included Cigarettes. She has a 60.00 pack-year smoking history. She has never used smokeless tobacco. She reports that she does not drink alcohol or use drugs.    ROS Review of Systems  Constitutional: Negative for activity change, appetite change and fever.  HENT: Negative for congestion, rhinorrhea and sore throat.   Eyes: Negative for visual disturbance.  Respiratory: Positive  for chest tightness. Negative for cough and shortness of breath.   Cardiovascular: Negative for chest pain and palpitations.  Gastrointestinal: Negative for abdominal pain, diarrhea and nausea.  Genitourinary: Negative for dysuria.  Musculoskeletal: Negative for arthralgias and myalgias.    Objective:  BP 136/81   Pulse 90   Temp 98.6 F (37 C) (Oral)   Ht 5\' 9"  (1.753 m)   Wt 161 lb (73 kg)   LMP 08/21/2013   BMI 23.78 kg/m   BP Readings from Last 3 Encounters:  10/18/16 136/81  10/02/16 (!) 147/83  08/15/16 93/66    Wt Readings from Last 3 Encounters:  10/18/16 161 lb (73 kg)  10/02/16 160 lb (72.6 kg)  08/15/16 161 lb 2 oz (73.1 kg)     Physical Exam  Constitutional: She is oriented to person, place, and time. She appears well-developed and well-nourished. No distress.  HENT:  Head: Normocephalic and atraumatic.  Right Ear: External ear normal.  Left Ear: External ear normal.  Nose: Nose normal.  Mouth/Throat: Oropharynx is clear and moist.  Eyes: Conjunctivae and EOM are normal. Pupils are equal, round, and reactive to light.  Neck: Normal range of motion. Neck supple. No thyromegaly present.  Cardiovascular: Normal rate, regular rhythm and normal heart sounds.   No murmur heard. Pulmonary/Chest: Breath sounds normal. No respiratory distress. She has no wheezes. She has no rales.  Patient splints due to pain on the right at the chest wall. There is tenderness for compression of the right lower lateral and anterior ribs.  Abdominal: Soft. Bowel sounds are normal. She exhibits no distension. There is no tenderness.  Lymphadenopathy:    She has no cervical adenopathy.  Neurological: She is alert and oriented to person, place, and time. She has normal reflexes.  Skin: Skin is warm and  dry.  Psychiatric: She has a normal mood and affect. Her behavior is normal. Judgment and thought content normal.     Lab Results  Component Value Date   WBC 6.8 08/15/2016   HGB  9.3 (L) 07/01/2016   HCT 41.6 08/15/2016   PLT 295 08/15/2016   GLUCOSE 63 (L) 08/15/2016   CHOL 233 (H) 08/15/2016   TRIG 209 (H) 08/15/2016   HDL 42 08/15/2016   LDLCALC 149 (H) 08/15/2016   ALT 6 08/15/2016   AST 8 08/15/2016   NA 142 08/15/2016   K 4.7 08/15/2016   CL 100 08/15/2016   CREATININE 1.57 (H) 08/15/2016   BUN 30 (H) 08/15/2016   CO2 23 08/15/2016   TSH 1.680 08/15/2016   INR 1.14 12/16/2015   HGBA1C 4.7 11/13/2013    Ct Abdomen Pelvis Wo Contrast  Result Date: 06/28/2016 CLINICAL DATA:  Worsening abdominal pain. EXAM: CT ABDOMEN AND PELVIS WITHOUT CONTRAST TECHNIQUE: Multidetector CT imaging of the abdomen and pelvis was performed following the standard protocol without IV contrast. COMPARISON:  Abdomen radiograph dated 06/23/2016. Abdomen and pelvis CT dated 02/04/2016. FINDINGS: Lower chest: Minimal patchy atelectasis or scarring in the left lower lobe anteriorly. Hepatobiliary: Distended gallbladder without abnormal dilatation, wall thickening or visible gallstones. Normal non contrasted appearance of the liver. Pancreas: No mass or inflammatory process identified on this un-enhanced exam. Spleen: Within normal limits in size and appearance. Adrenals/Urinary Tract: Normal appearing adrenal glands, kidneys, ureters and urinary bladder. No calculi or hydronephrosis. No visible mass. Stomach/Bowel: Dilated, fluid-filled small bowel loops throughout the abdomen pelvis to the level of the terminal ileum. There is some matting of the small bowel loops at that location with some fecalization of the bowel contents. Normal caliber colon. Prominent stool in the rectum. No evidence of appendicitis. Small to moderate-sized sliding hiatal hernia with refluxed contrast in the distal esophagus. Vascular/Lymphatic: Atheromatous arterial calcifications, including the abdominal aorta. No enlarged lymph nodes. Reproductive: No mass or other significant abnormality. Other: None.  Musculoskeletal: Lumbar spine degenerative changes at multiple levels. Mild anterior wedge deformities involving the L1, L2 and L3 vertebral bodies with minimal bony retropulsion. Mild diffuse compression deformity of the L5 vertebral body. No acute fracture lines. IMPRESSION: 1. Small bowel obstruction to the level of the terminal ileum were there is matting of the small bowel loops, suggesting adhesion formation. 2. Small to moderate-sized sliding hiatal hernia with gastroesophageal reflux. 3. Prominent stool in the rectum. 4. Aortic atherosclerosis. Electronically Signed   By: Claudie Revering M.D.   On: 06/28/2016 21:35   Dg Chest 2 View  Result Date: 06/28/2016 CLINICAL DATA:  70 y/o  F; vomiting and abdominal pain. EXAM: CHEST  2 VIEW COMPARISON:  None. FINDINGS: The heart size and mediastinal contours are within normal limits. Both lungs are clear. The visualized skeletal structures are unremarkable. Prominent loops of small bowel in the left upper quadrant with fluid levels may represent obstruction or ileus. Stable biapical scarring. IMPRESSION: No active cardiopulmonary disease. Prominent loops of small bowel in the left upper quadrant with fluid levels may represent obstruction or ileus. Electronically Signed   By: Kristine Garbe M.D.   On: 06/28/2016 19:53    Assessment & Plan:   [place] was seen today for chest pain.  Diagnoses and all orders for this visit:  Contusion of right front wall of thorax, initial encounter -     DG Ribs Unilateral W/Chest Right; Future     I have discontinued Ms. Price's predniSONE  and nitrofurantoin (macrocrystal-monohydrate). I am also having her maintain her folic acid, aspirin EC, thiamine, Ferrous Fumarate, acetaminophen, valsartan, cyclobenzaprine, DULoxetine, and levothyroxine.  No orders of the defined types were placed in this encounter.    Follow-up: Return if symptoms worsen or fail to improve and as previously arranged for routine  care.  Claretta Fraise, M.D.

## 2016-10-23 ENCOUNTER — Telehealth: Payer: Self-pay | Admitting: Family Medicine

## 2016-10-23 NOTE — Telephone Encounter (Signed)
Spoke with pt regarding Cymbalta RX [facility] informed pt that RX would be $120 Pt was told mail order does not have Duloxetine [facility] will fill Duloxetine 60mg  #90 for $32 RX called into [place] per pt request

## 2016-10-30 DIAGNOSIS — M1712 Unilateral primary osteoarthritis, left knee: Secondary | ICD-10-CM | POA: Diagnosis not present

## 2016-11-01 ENCOUNTER — Encounter: Payer: Self-pay | Admitting: Family Medicine

## 2016-11-06 DIAGNOSIS — M1712 Unilateral primary osteoarthritis, left knee: Secondary | ICD-10-CM | POA: Diagnosis not present

## 2016-11-13 DIAGNOSIS — M1712 Unilateral primary osteoarthritis, left knee: Secondary | ICD-10-CM | POA: Diagnosis not present

## 2016-11-15 ENCOUNTER — Ambulatory Visit (INDEPENDENT_AMBULATORY_CARE_PROVIDER_SITE_OTHER): Payer: Medicare Other | Admitting: Family Medicine

## 2016-11-15 ENCOUNTER — Encounter: Payer: Self-pay | Admitting: Family Medicine

## 2016-11-15 VITALS — BP 134/76 | HR 90 | Temp 97.0°F | Ht 69.0 in | Wt 163.2 lb

## 2016-11-15 DIAGNOSIS — D500 Iron deficiency anemia secondary to blood loss (chronic): Secondary | ICD-10-CM | POA: Diagnosis not present

## 2016-11-15 DIAGNOSIS — E782 Mixed hyperlipidemia: Secondary | ICD-10-CM | POA: Diagnosis not present

## 2016-11-15 DIAGNOSIS — E538 Deficiency of other specified B group vitamins: Secondary | ICD-10-CM | POA: Diagnosis not present

## 2016-11-15 DIAGNOSIS — I10 Essential (primary) hypertension: Secondary | ICD-10-CM | POA: Diagnosis not present

## 2016-11-15 DIAGNOSIS — E559 Vitamin D deficiency, unspecified: Secondary | ICD-10-CM

## 2016-11-15 DIAGNOSIS — E038 Other specified hypothyroidism: Secondary | ICD-10-CM

## 2016-11-15 DIAGNOSIS — N182 Chronic kidney disease, stage 2 (mild): Secondary | ICD-10-CM

## 2016-11-15 MED ORDER — ROSUVASTATIN CALCIUM 10 MG PO TABS: 10.0000 mg | ORAL_TABLET | Freq: Every day | ORAL | 3 refills | Status: DC

## 2016-11-15 MED ORDER — CO Q-10 100 MG PO CAPS: 100.0000 mg | ORAL_CAPSULE | Freq: Every day | ORAL | 10 refills | Status: DC

## 2016-11-15 NOTE — Progress Notes (Signed)
Subjective:  Patient ID: [name], female    DOB: [birth year]  Age: 70 y.o. MRN: [id number]  CC: Hyperlipidemia (3 month recheck); Hypertension; and Hypothyroidism   HPI [name] presents for  follow-up of hypertension. Patient has no history of headache chest pain or shortness of breath or recent cough. Patient also denies symptoms of TIA such as numbness weakness lateralizing. Patient checks  blood pressure at home and has not had any elevated readings recently. Patient denies side effects from his medication. States taking it regularly.  Patient also  in for follow-up of elevated cholesterol. Doing well without complaints on current medication. Has side effects of statin including myalgia and arthralgia. Has no nausea. Also in today for liver function testing. Currently no chest pain, shortness of breath or other cardiovascular related symptoms noted.    History [name] has a past medical history of Alcoholism ([place]) (01/09/2012); Anemia; Anxiety; B12 deficiency; Carotid artery narrowing (10/03/2012); Chronic kidney disease (CKD), stage II (mild); COPD (chronic obstructive pulmonary disease) ([place]); Depression; Headache(784.0); Hyperlipidemia; Hypertension; Hyponatremia (01/09/2012); Hypothyroid; Menopause; Mini stroke ([place]); Orthostatic syncope (10/02/2012); Pneumonia; SBO (small bowel obstruction) (06/28/2016); Shortness of breath; and Stroke ([place]) (2010).   She has a past surgical history that includes Appendectomy; Tonsillectomy; Cataract extraction w/PHACO (06/20/2012); Cataract extraction w/PHACO (07/11/2012); Colonoscopy (N/A, 12/17/2015); Esophagogastroduodenoscopy (N/A, 12/17/2015); Agile capsule (N/A, 02/22/2016); and Knee arthroscopy (Right, 09/2016).   Her family history includes GER disease in her mother; Hypertension in her maternal grandmother.She reports that she quit smoking about 24 years ago. Her smoking use included Cigarettes. She has a 60.00 pack-year smoking history. She  has never used smokeless tobacco. She reports that she does not drink alcohol or use drugs.  Current Outpatient Prescriptions on File Prior to Visit  Medication Sig Dispense Refill  . acetaminophen (TYLENOL) 500 MG tablet Take 1 tablet (500 mg total) by mouth 2 (two) times daily. Pain    . aspirin EC 81 MG tablet Take 81 mg by mouth daily.    . cyclobenzaprine (FLEXERIL) 10 MG tablet Take 1 tablet (10 mg total) by mouth 3 (three) times daily as needed. 30 tablet 0  . DULoxetine (CYMBALTA) 60 MG capsule Take 1 capsule (60 mg total) by mouth daily. 90 capsule 1  . Ferrous Fumarate (HEMOCYTE - 106 MG FE) 324 (106 Fe) MG TABS tablet Take 1 tablet (106 mg of iron total) by mouth 2 (two) times daily. 60 tablet 5  . folic acid (FOLVITE) 1 MG tablet Take 1 tablet (1 mg total) by mouth daily. 90 tablet 1  . levothyroxine (SYNTHROID, LEVOTHROID) 112 MCG tablet Take 1 tablet (112 mcg total) by mouth daily. 90 tablet 1  . thiamine (VITAMIN B-1) 100 MG tablet Take 1 tablet (100 mg total) by mouth daily. 90 tablet 3  . valsartan (DIOVAN) 160 MG tablet Take 1 tablet (160 mg total) by mouth daily. 90 tablet 1   No current facility-administered medications on file prior to visit.     ROS Review of Systems  Constitutional: Negative for activity change, appetite change and fever.  HENT: Negative for congestion, rhinorrhea and sore throat.   Eyes: Negative for visual disturbance.  Respiratory: Negative for cough and shortness of breath.   Cardiovascular: Negative for chest pain and palpitations.  Gastrointestinal: Negative for abdominal pain, diarrhea and nausea.  Genitourinary: Negative for dysuria.  Musculoskeletal: Negative for arthralgias and myalgias.    Objective:  BP 134/76   Pulse 90   Temp 97 F (36.1  C) (Oral)   Ht 5' 9" (1.753 m)   Wt 163 lb 3.2 oz (74 kg)   LMP 08/21/2013   BMI 24.10 kg/m   BP Readings from Last 3 Encounters:  11/15/16 134/76  10/18/16 136/81  10/02/16 (!) 147/83      Wt Readings from Last 3 Encounters:  11/15/16 163 lb 3.2 oz (74 kg)  10/18/16 161 lb (73 kg)  10/02/16 160 lb (72.6 kg)     Physical Exam  Constitutional: She is oriented to person, place, and time. She appears well-developed and well-nourished. No distress.  HENT:  Head: Normocephalic and atraumatic.  Right Ear: External ear normal.  Left Ear: External ear normal.  Nose: Nose normal.  Mouth/Throat: Oropharynx is clear and moist.  Eyes: Conjunctivae and EOM are normal. Pupils are equal, round, and reactive to light.  Neck: Normal range of motion. Neck supple. No thyromegaly present.  Cardiovascular: Normal rate, regular rhythm and normal heart sounds.   No murmur heard. Pulmonary/Chest: Effort normal and breath sounds normal. No respiratory distress. She has no wheezes. She has no rales.  Abdominal: Soft. Bowel sounds are normal. She exhibits no distension. There is no tenderness.  Lymphadenopathy:    She has no cervical adenopathy.  Neurological: She is alert and oriented to person, place, and time. She has normal reflexes.  Skin: Skin is warm and dry.  Psychiatric: She has a normal mood and affect. Her behavior is normal. Judgment and thought content normal.    Lab Results  Component Value Date   HGBA1C 4.7 11/13/2013   HGBA1C 5.0 10/22/2013    Lab Results  Component Value Date   WBC 7.8 11/15/2016   HGB 9.3 (L) 07/01/2016   HCT 39.0 11/15/2016   PLT 227 11/15/2016   GLUCOSE 85 11/15/2016   CHOL 257 (H) 11/15/2016   TRIG 240 (H) 11/15/2016   HDL 77 11/15/2016   LDLCALC 132 (H) 11/15/2016   ALT 8 11/15/2016   AST 7 11/15/2016   NA 139 11/15/2016   K 4.4 11/15/2016   CL 94 (L) 11/15/2016   CREATININE 1.10 (H) 11/15/2016   BUN 18 11/15/2016   CO2 25 11/15/2016   TSH 2.230 11/15/2016   INR 1.14 12/16/2015   HGBA1C 4.7 11/13/2013    Ct Abdomen Pelvis Wo Contrast  Result Date: 06/28/2016 CLINICAL DATA:  Worsening abdominal pain. EXAM: CT ABDOMEN AND  PELVIS WITHOUT CONTRAST TECHNIQUE: Multidetector CT imaging of the abdomen and pelvis was performed following the standard protocol without IV contrast. COMPARISON:  Abdomen radiograph dated 06/23/2016. Abdomen and pelvis CT dated 02/04/2016. FINDINGS: Lower chest: Minimal patchy atelectasis or scarring in the left lower lobe anteriorly. Hepatobiliary: Distended gallbladder without abnormal dilatation, wall thickening or visible gallstones. Normal non contrasted appearance of the liver. Pancreas: No mass or inflammatory process identified on this un-enhanced exam. Spleen: Within normal limits in size and appearance. Adrenals/Urinary Tract: Normal appearing adrenal glands, kidneys, ureters and urinary bladder. No calculi or hydronephrosis. No visible mass. Stomach/Bowel: Dilated, fluid-filled small bowel loops throughout the abdomen pelvis to the level of the terminal ileum. There is some matting of the small bowel loops at that location with some fecalization of the bowel contents. Normal caliber colon. Prominent stool in the rectum. No evidence of appendicitis. Small to moderate-sized sliding hiatal hernia with refluxed contrast in the distal esophagus. Vascular/Lymphatic: Atheromatous arterial calcifications, including the abdominal aorta. No enlarged lymph nodes. Reproductive: No mass or other significant abnormality. Other: None. Musculoskeletal: Lumbar spine degenerative changes  at multiple levels. Mild anterior wedge deformities involving the L1, L2 and L3 vertebral bodies with minimal bony retropulsion. Mild diffuse compression deformity of the L5 vertebral body. No acute fracture lines. IMPRESSION: 1. Small bowel obstruction to the level of the terminal ileum were there is matting of the small bowel loops, suggesting adhesion formation. 2. Small to moderate-sized sliding hiatal hernia with gastroesophageal reflux. 3. Prominent stool in the rectum. 4. Aortic atherosclerosis. Electronically Signed   By: Claudie Revering M.D.   On: 06/28/2016 21:35   Dg Chest 2 View  Result Date: 06/28/2016 CLINICAL DATA:  69 y/o  F; vomiting and abdominal pain. EXAM: CHEST  2 VIEW COMPARISON:  None. FINDINGS: The heart size and mediastinal contours are within normal limits. Both lungs are clear. The visualized skeletal structures are unremarkable. Prominent loops of small bowel in the left upper quadrant with fluid levels may represent obstruction or ileus. Stable biapical scarring. IMPRESSION: No active cardiopulmonary disease. Prominent loops of small bowel in the left upper quadrant with fluid levels may represent obstruction or ileus. Electronically Signed   By: Kristine Garbe M.D.   On: 06/28/2016 19:53    Assessment & Plan:   Natacha was seen today for hyperlipidemia, hypertension and hypothyroidism.  Diagnoses and all orders for this visit:  B12 deficiency -     CBC with Differential/Platelet -     CMP14+EGFR -     Vitamin B12  Chronic kidney disease (CKD), stage II (mild) -     CBC with Differential/Platelet -     CMP14+EGFR  Essential hypertension -     CBC with Differential/Platelet -     CMP14+EGFR  Mixed hyperlipidemia -     CBC with Differential/Platelet -     CMP14+EGFR -     Lipid panel  Other specified hypothyroidism -     CBC with Differential/Platelet -     CMP14+EGFR -     TSH + free T4  Iron deficiency anemia due to chronic blood loss -     CBC with Differential/Platelet -     CMP14+EGFR  Vitamin D deficiency -     VITAMIN D 25 Hydroxy (Vit-D Deficiency, Fractures)  Other orders -     Coenzyme Q10 (CO Q-10) 100 MG CAPS; Take 100 mg by mouth daily. -     rosuvastatin (CRESTOR) 10 MG tablet; Take 1 tablet (10 mg total) by mouth daily.   I am having Ms. Price start on Co Q-10 and rosuvastatin. I am also having her maintain her folic acid, aspirin EC, thiamine, Ferrous Fumarate, acetaminophen, valsartan, cyclobenzaprine, DULoxetine, and levothyroxine.  Meds ordered  this encounter  Medications  . Coenzyme Q10 (CO Q-10) 100 MG CAPS    Sig: Take 100 mg by mouth daily.    Dispense:  100 each    Refill:  10  . rosuvastatin (CRESTOR) 10 MG tablet    Sig: Take 1 tablet (10 mg total) by mouth daily.    Dispense:  90 tablet    Refill:  3     Follow-up: Return in about 6 months (around 05/16/2017).  Claretta Fraise, M.D.

## 2016-11-16 LAB — CBC WITH DIFFERENTIAL/PLATELET
Basophils Absolute: 0 10*3/uL (ref 0.0–0.2)
Basos: 0 %
EOS (ABSOLUTE): 0.1 10*3/uL (ref 0.0–0.4)
Eos: 1 %
Hematocrit: 39 % (ref 34.0–46.6)
Hemoglobin: 13.2 g/dL (ref 11.1–15.9)
Immature Grans (Abs): 0 10*3/uL (ref 0.0–0.1)
Immature Granulocytes: 1 %
Lymphocytes Absolute: 1.5 10*3/uL (ref 0.7–3.1)
Lymphs: 20 %
MCH: 31.6 pg (ref 26.6–33.0)
MCHC: 33.8 g/dL (ref 31.5–35.7)
MCV: 93 fL (ref 79–97)
Monocytes Absolute: 0.6 10*3/uL (ref 0.1–0.9)
Monocytes: 7 %
Neutrophils Absolute: 5.6 10*3/uL (ref 1.4–7.0)
Neutrophils: 71 %
Platelets: 227 10*3/uL (ref 150–379)
RBC: 4.18 x10E6/uL (ref 3.77–5.28)
RDW: 15.5 % — ABNORMAL HIGH (ref 12.3–15.4)
WBC: 7.8 10*3/uL (ref 3.4–10.8)

## 2016-11-16 LAB — CMP14+EGFR
ALT: 8 IU/L (ref 0–32)
AST: 7 IU/L (ref 0–40)
Albumin/Globulin Ratio: 2.2 (ref 1.2–2.2)
Albumin: 4.8 g/dL (ref 3.6–4.8)
Alkaline Phosphatase: 127 IU/L — ABNORMAL HIGH (ref 39–117)
BUN/Creatinine Ratio: 16 (ref 12–28)
BUN: 18 mg/dL (ref 8–27)
Bilirubin Total: 1.1 mg/dL (ref 0.0–1.2)
CO2: 25 mmol/L (ref 18–29)
Calcium: 9.7 mg/dL (ref 8.7–10.3)
Chloride: 94 mmol/L — ABNORMAL LOW (ref 96–106)
Creatinine, Ser: 1.1 mg/dL — ABNORMAL HIGH (ref 0.57–1.00)
GFR calc Af Amer: 59 mL/min/{1.73_m2} — ABNORMAL LOW (ref >59)
GFR calc non Af Amer: 51 mL/min/{1.73_m2} — ABNORMAL LOW (ref >59)
Globulin, Total: 2.2 g/dL (ref 1.5–4.5)
Glucose: 85 mg/dL (ref 65–99)
Potassium: 4.4 mmol/L (ref 3.5–5.2)
Sodium: 139 mmol/L (ref 134–144)
Total Protein: 7 g/dL (ref 6.0–8.5)

## 2016-11-16 LAB — TSH+FREE T4
Free T4: 1.69 ng/dL (ref 0.82–1.77)
TSH: 2.23 u[IU]/mL (ref 0.450–4.500)

## 2016-11-16 LAB — VITAMIN B12: Vitamin B-12: 193 pg/mL — ABNORMAL LOW (ref 232–1245)

## 2016-11-16 LAB — LIPID PANEL
Chol/HDL Ratio: 3.3 ratio units (ref 0.0–4.4)
Cholesterol, Total: 257 mg/dL — ABNORMAL HIGH (ref 100–199)
HDL: 77 mg/dL (ref >39)
LDL Calculated: 132 mg/dL — ABNORMAL HIGH (ref 0–99)
Triglycerides: 240 mg/dL — ABNORMAL HIGH (ref 0–149)
VLDL Cholesterol Cal: 48 mg/dL — ABNORMAL HIGH (ref 5–40)

## 2016-11-16 LAB — VITAMIN D 25 HYDROXY (VIT D DEFICIENCY, FRACTURES): Vit D, 25-Hydroxy: 18 ng/mL — ABNORMAL LOW (ref 30.0–100.0)

## 2016-12-20 ENCOUNTER — Telehealth: Payer: Self-pay | Admitting: Family Medicine

## 2016-12-20 NOTE — Telephone Encounter (Signed)
Noted  

## 2016-12-21 ENCOUNTER — Other Ambulatory Visit: Payer: Self-pay | Admitting: Family Medicine

## 2016-12-21 MED ORDER — LEVOTHYROXINE SODIUM 112 MCG PO TABS: 112.0000 ug | ORAL_TABLET | Freq: Every day | ORAL | 1 refills | Status: DC

## 2016-12-21 NOTE — Telephone Encounter (Signed)
Patient actually wants to use [facility] not [facility].

## 2016-12-22 MED ORDER — CYCLOBENZAPRINE HCL 10 MG PO TABS: 10.0000 mg | ORAL_TABLET | Freq: Three times a day (TID) | ORAL | 0 refills | Status: DC | PRN

## 2017-01-23 ENCOUNTER — Other Ambulatory Visit: Payer: Self-pay | Admitting: Family Medicine

## 2017-01-23 DIAGNOSIS — I10 Essential (primary) hypertension: Secondary | ICD-10-CM

## 2017-02-07 ENCOUNTER — Encounter: Payer: Medicare Other | Admitting: *Deleted

## 2017-02-13 ENCOUNTER — Ambulatory Visit (INDEPENDENT_AMBULATORY_CARE_PROVIDER_SITE_OTHER): Payer: Medicare HMO | Admitting: Family Medicine

## 2017-02-13 ENCOUNTER — Encounter: Payer: Self-pay | Admitting: Family Medicine

## 2017-02-13 VITALS — BP 142/88 | HR 93 | Temp 97.0°F | Ht 69.0 in | Wt 162.0 lb

## 2017-02-13 DIAGNOSIS — E038 Other specified hypothyroidism: Secondary | ICD-10-CM | POA: Diagnosis not present

## 2017-02-13 DIAGNOSIS — E538 Deficiency of other specified B group vitamins: Secondary | ICD-10-CM

## 2017-02-13 DIAGNOSIS — E782 Mixed hyperlipidemia: Secondary | ICD-10-CM

## 2017-02-13 DIAGNOSIS — I10 Essential (primary) hypertension: Secondary | ICD-10-CM | POA: Diagnosis not present

## 2017-02-13 NOTE — Progress Notes (Signed)
Subjective:  Patient ID: Karenann Cai, female    DOB: 18-Jul-1946  Age: 71 y.o. MRN: [id number]  CC: Hypertension (pt here today for routine follow up on her HTN, cholesterol and thyroid. )   HPI MARVETTE SCHAMP presents for  follow-up of hypertension. Patient has no history of headache chest pain or shortness of breath or recent cough. Patient also denies symptoms of TIA such as numbness weakness lateralizing. Patient checks  blood pressure at home and has not had any elevated readings recently. Patient denies side effects from medication. States taking it regularly. Patient presents for follow-up on  thyroid. The patient has a history of hypothyroidism for many years. It has been stable recently. Pt. denies any change in  voice, loss of hair, heat or cold intolerance. Energy level has been adequate to good. Patient denies constipation and diarrhea. No myxedema. Medication is as noted below. Verified that pt is taking it daily on an empty stomach. Well tolerated.    Patient states that she is not taking a B12 supplement at this time. She is due to have that checked again as well.  Seeing ortho for knee and LLE pain hip to knee. Knee XR shows significant osteo arthritis changes   History Camari has a past medical history of Alcoholism ([place]) (01/09/2012); Anemia; Anxiety; B12 deficiency; Carotid artery narrowing (10/03/2012); Chronic kidney disease (CKD), stage II (mild); COPD (chronic obstructive pulmonary disease) ([place]); Depression; Headache(784.0); Hyperlipidemia; Hypertension; Hyponatremia (01/09/2012); Hypothyroid; Menopause; Mini stroke ([place]); Orthostatic syncope (10/02/2012); Pneumonia; SBO (small bowel obstruction) (06/28/2016); Shortness of breath; and Stroke ([place]) (2010).   She has a past surgical history that includes Appendectomy; Tonsillectomy; Cataract extraction w/PHACO (06/20/2012); Cataract extraction w/PHACO (07/11/2012); Colonoscopy (N/A, 12/17/2015); Esophagogastroduodenoscopy (N/A,  12/17/2015); Agile capsule (N/A, 02/22/2016); and Knee arthroscopy (Right, 09/2016).   Her family history includes GER disease in her mother; Hypertension in her maternal grandmother.She reports that she quit smoking about 24 years ago. Her smoking use included Cigarettes. She has a 60.00 pack-year smoking history. She has never used smokeless tobacco. She reports that she does not drink alcohol or use drugs.  Current Outpatient Prescriptions on File Prior to Visit  Medication Sig Dispense Refill  . acetaminophen (TYLENOL) 500 MG tablet Take 1 tablet (500 mg total) by mouth 2 (two) times daily. Pain    . aspirin EC 81 MG tablet Take 81 mg by mouth daily.    . Coenzyme Q10 (CO Q-10) 100 MG CAPS Take 100 mg by mouth daily. 100 each 10  . DULoxetine (CYMBALTA) 60 MG capsule Take 1 capsule (60 mg total) by mouth daily. 90 capsule 1  . folic acid (FOLVITE) 1 MG tablet Take 1 tablet (1 mg total) by mouth daily. 90 tablet 1  . levothyroxine (SYNTHROID, LEVOTHROID) 112 MCG tablet Take 1 tablet (112 mcg total) by mouth daily. 90 tablet 1  . rosuvastatin (CRESTOR) 10 MG tablet Take 1 tablet (10 mg total) by mouth daily. 90 tablet 3  . thiamine (VITAMIN B-1) 100 MG tablet Take 1 tablet (100 mg total) by mouth daily. 90 tablet 3  . valsartan (DIOVAN) 160 MG tablet TAKE TWO TABLETS BY MOUTH ONCE DAILY 60 tablet 3   No current facility-administered medications on file prior to visit.     ROS Review of Systems  Constitutional: Negative for activity change, appetite change and fever.  HENT: Negative for congestion, rhinorrhea and sore throat.   Eyes: Negative for visual disturbance.  Respiratory: Negative for cough and shortness of  breath.   Cardiovascular: Negative for chest pain and palpitations.  Gastrointestinal: Negative for abdominal pain, diarrhea and nausea.  Genitourinary: Negative for dysuria.  Musculoskeletal: Positive for arthralgias and myalgias (pain from hip to knee. Dr. Gladstone Lighter of otho dxed  DDD, lumbar).    Objective:  BP (!) 142/88   Pulse 93   Temp 97 F (36.1 C) (Oral)   Ht 5' 9"  (1.753 m)   Wt 162 lb (73.5 kg)   LMP 08/21/2013   BMI 23.92 kg/m   BP Readings from Last 3 Encounters:  02/13/17 (!) 142/88  11/15/16 134/76  10/18/16 136/81    Wt Readings from Last 3 Encounters:  02/13/17 162 lb (73.5 kg)  11/15/16 163 lb 3.2 oz (74 kg)  10/18/16 161 lb (73 kg)     Physical Exam  Constitutional: She is oriented to person, place, and time. She appears well-developed and well-nourished. No distress.  HENT:  Head: Normocephalic and atraumatic.  Right Ear: External ear normal.  Left Ear: External ear normal.  Nose: Nose normal.  Mouth/Throat: Oropharynx is clear and moist.  Eyes: Conjunctivae and EOM are normal. Pupils are equal, round, and reactive to light.  Neck: Normal range of motion. Neck supple. No thyromegaly present.  Cardiovascular: Normal rate, regular rhythm and normal heart sounds.   No murmur heard. Pulmonary/Chest: Effort normal and breath sounds normal. No respiratory distress. She has no wheezes. She has no rales.  Abdominal: Soft. Bowel sounds are normal. She exhibits no distension. There is no tenderness.  Lymphadenopathy:    She has no cervical adenopathy.  Neurological: She is alert and oriented to person, place, and time. She has normal reflexes.  Skin: Skin is warm and dry.  Psychiatric: She has a normal mood and affect. Her behavior is normal. Judgment and thought content normal.     Lab Results  Component Value Date   WBC 7.8 11/15/2016   HGB 9.3 (L) 07/01/2016   HCT 39.0 11/15/2016   PLT 227 11/15/2016   GLUCOSE 85 11/15/2016   CHOL 257 (H) 11/15/2016   TRIG 240 (H) 11/15/2016   HDL 77 11/15/2016   LDLCALC 132 (H) 11/15/2016   ALT 8 11/15/2016   AST 7 11/15/2016   NA 139 11/15/2016   K 4.4 11/15/2016   CL 94 (L) 11/15/2016   CREATININE 1.10 (H) 11/15/2016   BUN 18 11/15/2016   CO2 25 11/15/2016   TSH 2.230  11/15/2016   INR 1.14 12/16/2015   HGBA1C 4.7 11/13/2013        Assessment & Plan:   Vidhi was seen today for hypertension.  Diagnoses and all orders for this visit:  Essential hypertension -     CMP14+EGFR  Mixed hyperlipidemia -     CMP14+EGFR  B12 deficiency -     Vitamin B12 -     CBC with Differential/Platelet -     CMP14+EGFR  Other specified hypothyroidism -     CMP14+EGFR   I have discontinued Ms. Price's Ferrous Fumarate and cyclobenzaprine. I am also having her maintain her folic acid, aspirin EC, thiamine, acetaminophen, DULoxetine, Co Q-10, rosuvastatin, levothyroxine, valsartan, oxyCODONE-acetaminophen, predniSONE, and traMADol.  Meds ordered this encounter  Medications  . oxyCODONE-acetaminophen (PERCOCET/ROXICET) 5-325 MG tablet  . predniSONE (DELTASONE) 10 MG tablet  . traMADol (ULTRAM) 50 MG tablet    Consider MRI LS Spine. May also need knee replacement soon.    Follow-up: Return in about 6 months (around 08/16/2017) for Wellness.  Claretta Fraise, M.D.

## 2017-02-14 ENCOUNTER — Other Ambulatory Visit: Payer: Self-pay | Admitting: Family Medicine

## 2017-02-14 LAB — CBC WITH DIFFERENTIAL/PLATELET
Basophils Absolute: 0 10*3/uL (ref 0.0–0.2)
Basos: 0 %
EOS (ABSOLUTE): 0 10*3/uL (ref 0.0–0.4)
Eos: 0 %
Hematocrit: 39.1 % (ref 34.0–46.6)
Hemoglobin: 13.5 g/dL (ref 11.1–15.9)
Immature Grans (Abs): 0.1 10*3/uL (ref 0.0–0.1)
Immature Granulocytes: 1 %
Lymphocytes Absolute: 1.8 10*3/uL (ref 0.7–3.1)
Lymphs: 22 %
MCH: 31.8 pg (ref 26.6–33.0)
MCHC: 34.5 g/dL (ref 31.5–35.7)
MCV: 92 fL (ref 79–97)
Monocytes Absolute: 0.4 10*3/uL (ref 0.1–0.9)
Monocytes: 5 %
Neutrophils Absolute: 5.8 10*3/uL (ref 1.4–7.0)
Neutrophils: 72 %
Platelets: 309 10*3/uL (ref 150–379)
RBC: 4.24 x10E6/uL (ref 3.77–5.28)
RDW: 13.4 % (ref 12.3–15.4)
WBC: 8 10*3/uL (ref 3.4–10.8)

## 2017-02-14 LAB — CMP14+EGFR
ALT: 6 IU/L (ref 0–32)
AST: 8 IU/L (ref 0–40)
Albumin/Globulin Ratio: 1.9 (ref 1.2–2.2)
Albumin: 4.5 g/dL (ref 3.6–4.8)
Alkaline Phosphatase: 96 IU/L (ref 39–117)
BUN/Creatinine Ratio: 22 (ref 12–28)
BUN: 23 mg/dL (ref 8–27)
Bilirubin Total: 0.7 mg/dL (ref 0.0–1.2)
CO2: 25 mmol/L (ref 18–29)
Calcium: 9.5 mg/dL (ref 8.7–10.3)
Chloride: 99 mmol/L (ref 96–106)
Creatinine, Ser: 1.05 mg/dL — ABNORMAL HIGH (ref 0.57–1.00)
GFR calc Af Amer: 63 mL/min/{1.73_m2} (ref >59)
GFR calc non Af Amer: 54 mL/min/{1.73_m2} — ABNORMAL LOW (ref >59)
Globulin, Total: 2.4 g/dL (ref 1.5–4.5)
Glucose: 101 mg/dL — ABNORMAL HIGH (ref 65–99)
Potassium: 4.2 mmol/L (ref 3.5–5.2)
Sodium: 141 mmol/L (ref 134–144)
Total Protein: 6.9 g/dL (ref 6.0–8.5)

## 2017-02-14 LAB — VITAMIN B12: Vitamin B-12: 234 pg/mL (ref 232–1245)

## 2017-02-21 ENCOUNTER — Other Ambulatory Visit: Payer: Self-pay

## 2017-02-21 MED ORDER — DULOXETINE HCL 60 MG PO CPEP: 60.0000 mg | ORAL_CAPSULE | Freq: Every day | ORAL | 1 refills | Status: DC

## 2017-03-03 IMAGING — DX DG ABDOMEN 1V
1 series · 1 of 1 positions shown · non-contrast
Comparison: 02/23/2016

CLINICAL DATA: Left lower quadrant abdominal pain.

EXAM:
ABDOMEN - 1 VIEW

[abdomen kub]
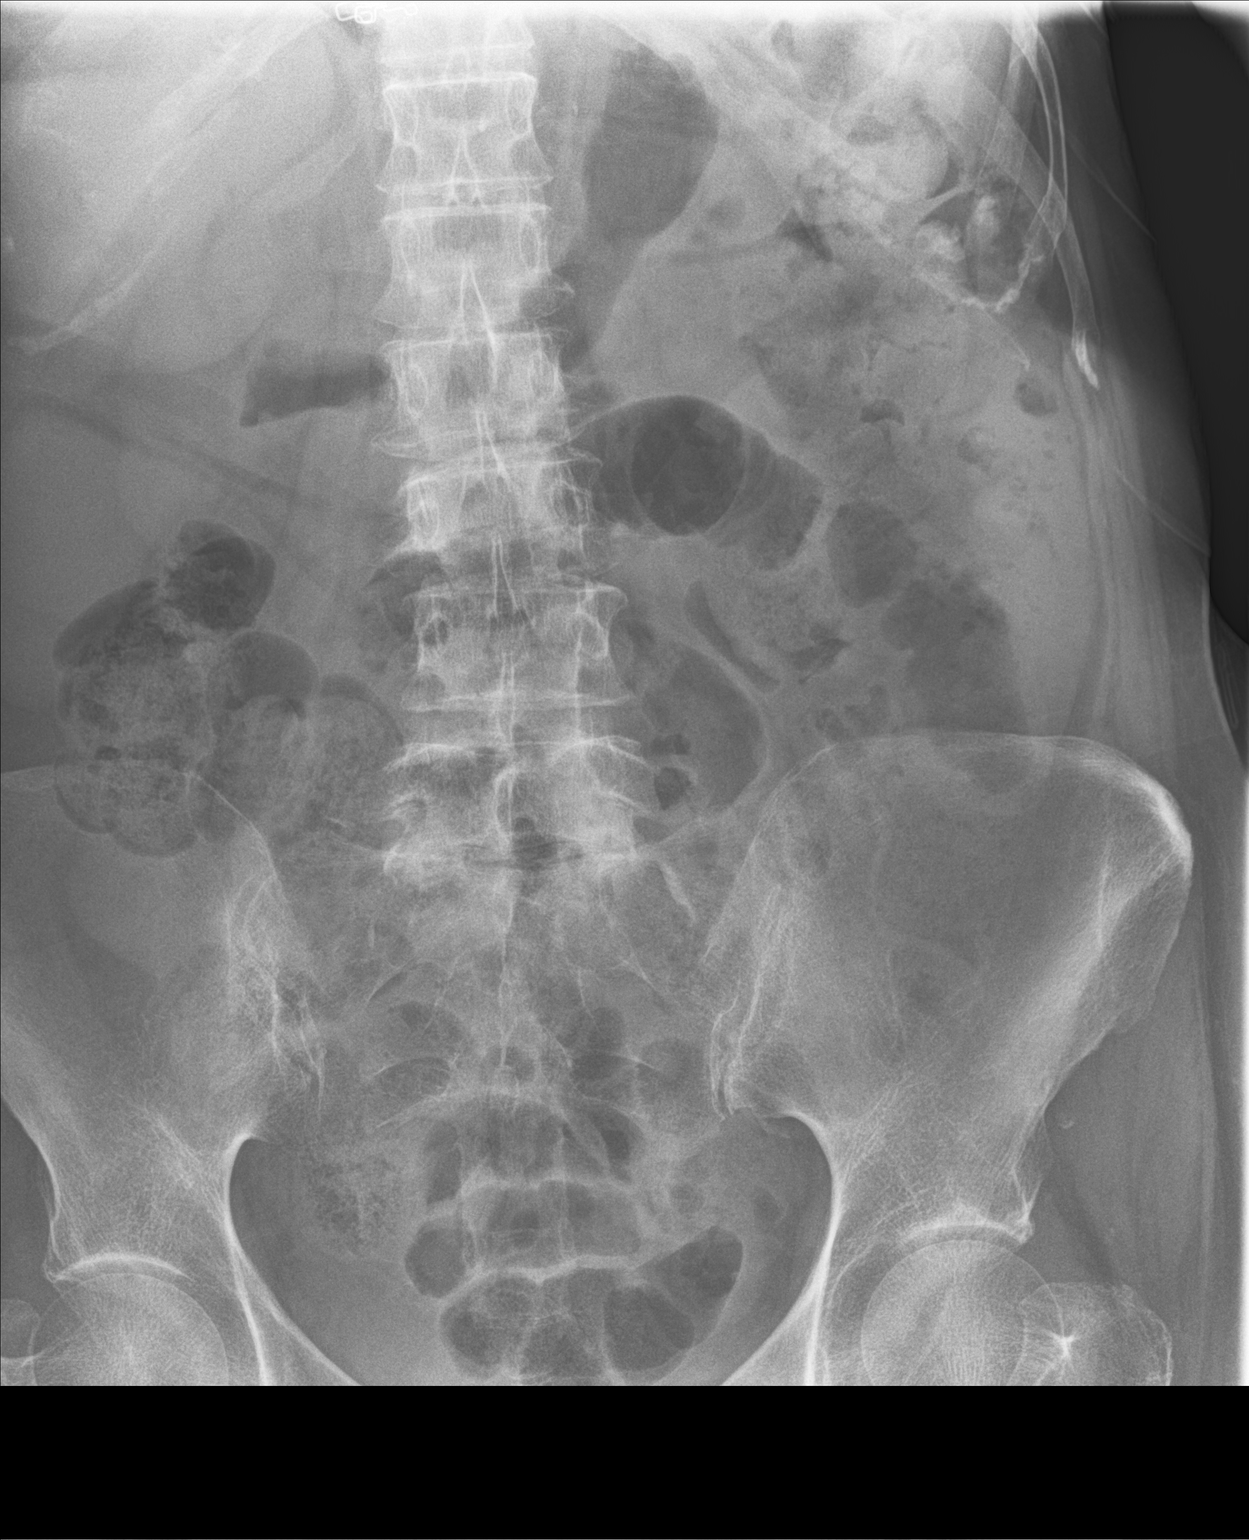

[1 of 1 positions shown; findings below may reference images not displayed]

FINDINGS: Normal bowel gas pattern. No obstruction. Mild colonic stool burden.

No evidence of renal or ureteral stones. Soft tissues are
unremarkable.
IMPRESSION: 1. No acute findings.  No evidence of bowel obstruction.
2. Mild colonic stool burden.

## 2017-03-06 ENCOUNTER — Other Ambulatory Visit: Payer: Self-pay | Admitting: Orthopedic Surgery

## 2017-03-06 DIAGNOSIS — M4807 Spinal stenosis, lumbosacral region: Secondary | ICD-10-CM

## 2017-03-14 NOTE — Discharge Instructions (Signed)
Myelogram Discharge Instructions  1. Go home and rest quietly for the next 24 hours.  It is important to lie flat for the next 24 hours.  Get up only to go to the restroom.  You may lie in the bed or on a couch on your back, your stomach, your left side or your right side.  You may have one pillow under your head.  You may have pillows between your knees while you are on your side or under your knees while you are on your back.  2. DO NOT drive today.  Recline the seat as far back as it will go, while still wearing your seat belt, on the way home.  3. You may get up to go to the bathroom as needed.  You may sit up for 10 minutes to eat.  You may resume your normal diet and medications unless otherwise indicated.  Drink lots of extra fluids today and tomorrow.  4. The incidence of headache, nausea, or vomiting is about 5% (one in 20 patients).  If you develop a headache, lie flat and drink plenty of fluids until the headache goes away.  Caffeinated beverages may be helpful.  If you develop severe nausea and vomiting or a headache that does not go away with flat bed rest, call [phone].  5. You may resume normal activities after your 24 hours of bed rest is over; however, do not exert yourself strongly or do any heavy lifting tomorrow. If when you get up you have a headache when standing, go back to bed and force fluids for another 24 hours.  6. Call your physician for a follow-up appointment.  The results of your myelogram will be sent directly to your physician by the following day.  7. If you have any questions or if complications develop after you arrive home, please call [phone].  Discharge instructions have been explained to the patient.  The patient, or the person responsible for the patient, fully understands these instructions.       May resume Cymbalta and UItram on March 16, 2017, after 9:30 am.

## 2017-03-15 ENCOUNTER — Ambulatory Visit
Admission: RE | Admit: 2017-03-15 | Discharge: 2017-03-15 | Disposition: A | Discharge status: Home / Self Care | Payer: Medicare HMO | Source: Ambulatory Visit | Attending: Orthopedic Surgery | Admitting: Orthopedic Surgery

## 2017-03-15 DIAGNOSIS — M4807 Spinal stenosis, lumbosacral region: Secondary | ICD-10-CM

## 2017-03-15 MED ORDER — DIAZEPAM 5 MG PO TABS
5.0000 mg | ORAL_TABLET | Freq: Once | ORAL | Status: AC
  Administered 2017-03-15: 5 mg via ORAL

## 2017-03-15 MED ORDER — IOPAMIDOL (ISOVUE-M 200) INJECTION 41%
15.0000 mL | Freq: Once | INTRAMUSCULAR | Status: AC
  Administered 2017-03-15: 15 mL via INTRATHECAL

## 2017-04-18 ENCOUNTER — Other Ambulatory Visit: Payer: Self-pay

## 2017-04-18 DIAGNOSIS — I10 Essential (primary) hypertension: Secondary | ICD-10-CM

## 2017-04-18 MED ORDER — VALSARTAN 160 MG PO TABS: 320.0000 mg | ORAL_TABLET | Freq: Every day | ORAL | 0 refills | Status: DC

## 2017-05-16 NOTE — Patient Instructions (Addendum)
[name]  05/16/2017   Your procedure is scheduled on: 05-25-17  Report to [facility] Main  Entrance Take [place] Elevators to 3rd floor to  [place] at 12:23 PM.    Call this number if you have problems the morning of surgery [phone]    Remember: ONLY 1 PERSON MAY GO WITH YOU TO SHORT STAY TO GET  READY MORNING OF [name].  Do not eat food or drink liquids :After Midnight. You may have a Clear Liquid Diet from Midnight until 08:23 AM. After 8:23AM nothing by mouth     CLEAR LIQUID DIET   Foods Allowed                                                                     Foods Excluded  Coffee and tea, regular and decaf                             liquids that you cannot  Plain Jell-O in any flavor                                             see through such as: Fruit ices (not with fruit pulp)                                     milk, soups, orange juice  Iced Popsicles                                    All solid food Carbonated beverages, regular and diet                                    Cranberry, grape and apple juices Sports drinks like Gatorade Lightly seasoned clear broth or consume(fat free) Sugar, honey syrup  Sample Menu Breakfast                                Lunch                                     Supper Cranberry juice                    Beef broth                            Chicken broth Jell-O                                     Grape juice  Apple juice Coffee or tea                        Jell-O                                      Popsicle                                                Coffee or tea                        Coffee or tea  _____________________________________________________________________     Take these medicines the morning of surgery with A SIP OF WATER: Duloxetine (Cymbalta), and  Levothyroxine (Synthroid)                                You may not have any metal  on your body including hair pins and              piercings  Do not wear jewelry, make-up, lotions, powders or perfumes, deodorant             Do not wear nail polish.  Do not shave  48 hours prior to surgery.                Do not bring valuables to the hospital. [place].  Contacts, dentures or bridgework may not be worn into surgery.  Leave suitcase in the car. After surgery it may be brought to your room.                 Please read over the following fact sheets you were given: _____________________________________________________________________  [facility] - Preparing for Surgery Before surgery, you can play an important role.  Because skin is not sterile, your skin needs to be as free of germs as possible.  You can reduce the number of germs on your skin by washing with CHG (chlorahexidine gluconate) soap before surgery.  CHG is an antiseptic cleaner which kills germs and bonds with the skin to continue killing germs even after washing. Please DO NOT use if you have an allergy to CHG or antibacterial soaps.  If your skin becomes reddened/irritated stop using the CHG and inform your nurse when you arrive at Short Stay. Do not shave (including legs and underarms) for at least 48 hours prior to the first CHG shower.  You may shave your face/neck. Please follow these instructions carefully:  1.  Shower with CHG Soap the night before surgery and the  morning of Surgery.  2.  If you choose to wash your hair, wash your hair first as usual with your  normal  shampoo.  3.  After you shampoo, rinse your hair and body thoroughly to remove the  shampoo.                           4.  Use CHG as you would any other liquid soap.  You can apply chg directly  to the skin and  wash                       Gently with a scrungie or clean washcloth.  5.  Apply the CHG Soap to your body ONLY FROM THE NECK DOWN.   Do not use on face/ open                            Wound or open sores. Avoid contact with eyes, ears mouth and genitals (private parts).                       Wash face,  Genitals (private parts) with your normal soap.             6.  Wash thoroughly, paying special attention to the area where your surgery  will be performed.  7.  Thoroughly rinse your body with warm water from the neck down.  8.  DO NOT shower/wash with your normal soap after using and rinsing off  the CHG Soap.                9.  Pat yourself dry with a clean towel.            10.  Wear clean pajamas.            11.  Place clean sheets on your bed the night of your first shower and do not  sleep with pets. Day of Surgery : Do not apply any lotions/deodorants the morning of surgery.  Please wear clean clothes to the hospital/surgery center.  FAILURE TO FOLLOW THESE INSTRUCTIONS MAY RESULT IN THE CANCELLATION OF YOUR SURGERY PATIENT SIGNATURE_________________________________  NURSE SIGNATURE__________________________________  ________________________________________________________________________   [name]  An incentive spirometer is a tool that can help keep your lungs clear and active. This tool measures how well you are filling your lungs with each breath. Taking long deep breaths may help reverse or decrease the chance of developing breathing (pulmonary) problems (especially infection) following:  A long period of time when you are unable to move or be active. BEFORE THE PROCEDURE   If the spirometer includes an indicator to show your best effort, your nurse or respiratory therapist will set it to a desired goal.  If possible, sit up straight or lean slightly forward. Try not to slouch.  Hold the incentive spirometer in an upright position. INSTRUCTIONS FOR USE  1. Sit on the edge of your bed if possible, or sit up as far as you can in bed or on a chair. 2. Hold the incentive spirometer in an upright position. 3. Breathe out  normally. 4. Place the mouthpiece in your mouth and seal your lips tightly around it. 5. Breathe in slowly and as deeply as possible, raising the piston or the ball toward the top of the column. 6. Hold your breath for 3-5 seconds or for as long as possible. Allow the piston or ball to fall to the bottom of the column. 7. Remove the mouthpiece from your mouth and breathe out normally. 8. Rest for a few seconds and repeat Steps 1 through 7 at least 10 times every 1-2 hours when you are awake. Take your time and take a few normal breaths between deep breaths. 9. The spirometer may include an indicator to show your best effort. Use the indicator as a goal to work toward during each repetition. 10. After each set of 10  deep breaths, practice coughing to be sure your lungs are clear. If you have an incision (the cut made at the time of surgery), support your incision when coughing by placing a pillow or rolled up towels firmly against it. Once you are able to get out of bed, walk around indoors and cough well. You may stop using the incentive spirometer when instructed by your caregiver.  RISKS AND COMPLICATIONS  Take your time so you do not get dizzy or light-headed.  If you are in pain, you may need to take or ask for pain medication before doing incentive spirometry. It is harder to take a deep breath if you are having pain. AFTER USE  Rest and breathe slowly and easily.  It can be helpful to keep track of a log of your progress. Your caregiver can provide you with a simple table to help with this. If you are using the spirometer at home, follow these instructions: [place] IF:   You are having difficultly using the spirometer.  You have trouble using the spirometer as often as instructed.  Your pain medication is not giving enough relief while using the spirometer.  You develop fever of 100.5 F (38.1 C) or higher. SEEK IMMEDIATE MEDICAL CARE IF:   You cough up bloody sputum  that had not been present before.  You develop fever of 102 F (38.9 C) or greater.  You develop worsening pain at or near the incision site. MAKE SURE YOU:   Understand these instructions.  Will watch your condition.  Will get help right away if you are not doing well or get worse. Document Released: 04/09/2007 Document Revised: 02/19/2012 Document Reviewed: 06/10/2007 [facility] Patient Information 2014 [place], [place].   ________________________________________________________________________

## 2017-05-16 NOTE — Progress Notes (Signed)
04-03-17 Surgical clearance on chart

## 2017-05-18 ENCOUNTER — Encounter (HOSPITAL_COMMUNITY)
Admission: RE | Admit: 2017-05-18 | Discharge: 2017-05-18 | Disposition: A | Discharge status: Home / Self Care | Payer: Medicare HMO | Source: Ambulatory Visit | Attending: Orthopedic Surgery | Admitting: Orthopedic Surgery

## 2017-05-18 ENCOUNTER — Ambulatory Visit (HOSPITAL_COMMUNITY)
Admission: RE | Admit: 2017-05-18 | Discharge: 2017-05-18 | Disposition: A | Discharge status: Home / Self Care | Payer: Medicare HMO | Source: Ambulatory Visit | Attending: Surgical | Admitting: Surgical

## 2017-05-18 ENCOUNTER — Encounter (HOSPITAL_COMMUNITY): Payer: Self-pay

## 2017-05-18 DIAGNOSIS — Z01810 Encounter for preprocedural cardiovascular examination: Secondary | ICD-10-CM | POA: Diagnosis not present

## 2017-05-18 DIAGNOSIS — J984 Other disorders of lung: Secondary | ICD-10-CM | POA: Insufficient documentation

## 2017-05-18 DIAGNOSIS — Z01818 Encounter for other preprocedural examination: Secondary | ICD-10-CM | POA: Insufficient documentation

## 2017-05-18 DIAGNOSIS — I700 Atherosclerosis of aorta: Secondary | ICD-10-CM | POA: Diagnosis not present

## 2017-05-18 LAB — CBC WITH DIFFERENTIAL/PLATELET
Basophils Absolute: 0 10*3/uL (ref 0.0–0.1)
Basophils Relative: 0 %
Eosinophils Absolute: 0.1 10*3/uL (ref 0.0–0.7)
Eosinophils Relative: 1 %
HCT: 38.5 % (ref 36.0–46.0)
Hemoglobin: 13 g/dL (ref 12.0–15.0)
Lymphocytes Relative: 24 %
Lymphs Abs: 1.9 10*3/uL (ref 0.7–4.0)
MCH: 31 pg (ref 26.0–34.0)
MCHC: 33.8 g/dL (ref 30.0–36.0)
MCV: 91.7 fL (ref 78.0–100.0)
Monocytes Absolute: 0.5 10*3/uL (ref 0.1–1.0)
Monocytes Relative: 7 %
Neutro Abs: 5.4 10*3/uL (ref 1.7–7.7)
Neutrophils Relative %: 68 %
Platelets: 214 10*3/uL (ref 150–400)
RBC: 4.2 MIL/uL (ref 3.87–5.11)
RDW: 13.3 % (ref 11.5–15.5)
WBC: 8 10*3/uL (ref 4.0–10.5)

## 2017-05-18 LAB — URINALYSIS, ROUTINE W REFLEX MICROSCOPIC
Bilirubin Urine: NEGATIVE
Glucose, UA: NEGATIVE mg/dL
Ketones, ur: NEGATIVE mg/dL
Nitrite: POSITIVE — AB
Protein, ur: NEGATIVE mg/dL
Specific Gravity, Urine: 1.006 (ref 1.005–1.030)
pH: 5 (ref 5.0–8.0)

## 2017-05-18 LAB — COMPREHENSIVE METABOLIC PANEL
ALT: 9 U/L — ABNORMAL LOW (ref 14–54)
AST: 13 U/L — ABNORMAL LOW (ref 15–41)
Albumin: 4.1 g/dL (ref 3.5–5.0)
Alkaline Phosphatase: 94 U/L (ref 38–126)
Anion gap: 9 (ref 5–15)
BUN: 19 mg/dL (ref 6–20)
CO2: 25 mmol/L (ref 22–32)
Calcium: 9.1 mg/dL (ref 8.9–10.3)
Chloride: 104 mmol/L (ref 101–111)
Creatinine, Ser: 0.88 mg/dL (ref 0.44–1.00)
GFR calc Af Amer: >60 mL/min (ref >60)
GFR calc non Af Amer: >60 mL/min (ref >60)
Glucose, Bld: 78 mg/dL (ref 65–99)
Potassium: 3.7 mmol/L (ref 3.5–5.1)
Sodium: 138 mmol/L (ref 135–145)
Total Bilirubin: 0.6 mg/dL (ref 0.3–1.2)
Total Protein: 7.2 g/dL (ref 6.5–8.1)

## 2017-05-18 LAB — APTT: aPTT: 29 seconds (ref 24–36)

## 2017-05-18 LAB — PROTIME-INR
INR: 1
Prothrombin Time: 13.2 seconds (ref 11.4–15.2)

## 2017-05-18 LAB — SURGICAL PCR SCREEN
MRSA, PCR: NEGATIVE
Staphylococcus aureus: NEGATIVE

## 2017-05-25 ENCOUNTER — Observation Stay (HOSPITAL_COMMUNITY)
Admission: RE | Admit: 2017-05-25 | Discharge: 2017-05-26 | Disposition: A | Discharge status: Home / Self Care | Payer: Medicare HMO | Source: Ambulatory Visit | Attending: Orthopedic Surgery | Admitting: Orthopedic Surgery

## 2017-05-25 ENCOUNTER — Encounter (HOSPITAL_COMMUNITY): Payer: Self-pay | Admitting: *Deleted

## 2017-05-25 ENCOUNTER — Ambulatory Visit (HOSPITAL_COMMUNITY): Payer: Medicare HMO | Admitting: Certified Registered Nurse Anesthetist

## 2017-05-25 ENCOUNTER — Encounter (HOSPITAL_COMMUNITY)
Admission: RE | Disposition: A | Discharge status: Home / Self Care | Payer: Self-pay | Source: Ambulatory Visit | Attending: Orthopedic Surgery

## 2017-05-25 ENCOUNTER — Ambulatory Visit (HOSPITAL_COMMUNITY): Payer: Medicare HMO

## 2017-05-25 DIAGNOSIS — M8580 Other specified disorders of bone density and structure, unspecified site: Secondary | ICD-10-CM | POA: Insufficient documentation

## 2017-05-25 DIAGNOSIS — F1020 Alcohol dependence, uncomplicated: Secondary | ICD-10-CM | POA: Insufficient documentation

## 2017-05-25 DIAGNOSIS — M48062 Spinal stenosis, lumbar region with neurogenic claudication: Principal | ICD-10-CM | POA: Diagnosis present

## 2017-05-25 DIAGNOSIS — I129 Hypertensive chronic kidney disease with stage 1 through stage 4 chronic kidney disease, or unspecified chronic kidney disease: Secondary | ICD-10-CM | POA: Diagnosis not present

## 2017-05-25 DIAGNOSIS — Z7982 Long term (current) use of aspirin: Secondary | ICD-10-CM | POA: Insufficient documentation

## 2017-05-25 DIAGNOSIS — Z419 Encounter for procedure for purposes other than remedying health state, unspecified: Secondary | ICD-10-CM

## 2017-05-25 DIAGNOSIS — N182 Chronic kidney disease, stage 2 (mild): Secondary | ICD-10-CM | POA: Diagnosis not present

## 2017-05-25 DIAGNOSIS — I700 Atherosclerosis of aorta: Secondary | ICD-10-CM | POA: Diagnosis not present

## 2017-05-25 DIAGNOSIS — Z87891 Personal history of nicotine dependence: Secondary | ICD-10-CM | POA: Diagnosis not present

## 2017-05-25 DIAGNOSIS — Z79899 Other long term (current) drug therapy: Secondary | ICD-10-CM | POA: Insufficient documentation

## 2017-05-25 DIAGNOSIS — E871 Hypo-osmolality and hyponatremia: Secondary | ICD-10-CM | POA: Insufficient documentation

## 2017-05-25 DIAGNOSIS — E039 Hypothyroidism, unspecified: Secondary | ICD-10-CM | POA: Insufficient documentation

## 2017-05-25 DIAGNOSIS — F419 Anxiety disorder, unspecified: Secondary | ICD-10-CM | POA: Diagnosis not present

## 2017-05-25 DIAGNOSIS — E538 Deficiency of other specified B group vitamins: Secondary | ICD-10-CM | POA: Diagnosis not present

## 2017-05-25 DIAGNOSIS — Z8673 Personal history of transient ischemic attack (TIA), and cerebral infarction without residual deficits: Secondary | ICD-10-CM | POA: Diagnosis not present

## 2017-05-25 DIAGNOSIS — F329 Major depressive disorder, single episode, unspecified: Secondary | ICD-10-CM | POA: Insufficient documentation

## 2017-05-25 DIAGNOSIS — M21372 Foot drop, left foot: Secondary | ICD-10-CM | POA: Insufficient documentation

## 2017-05-25 DIAGNOSIS — E785 Hyperlipidemia, unspecified: Secondary | ICD-10-CM | POA: Diagnosis not present

## 2017-05-25 DIAGNOSIS — J449 Chronic obstructive pulmonary disease, unspecified: Secondary | ICD-10-CM | POA: Diagnosis not present

## 2017-05-25 HISTORY — PX: LUMBAR LAMINECTOMY/DECOMPRESSION MICRODISCECTOMY: SHX5026

## 2017-05-25 SURGERY — LUMBAR LAMINECTOMY/DECOMPRESSION MICRODISCECTOMY
Anesthesia: General | Site: Back

## 2017-05-25 MED ORDER — LACTATED RINGERS IV SOLN
INTRAVENOUS | Status: DC
  Administered 2017-05-25 (×2): via INTRAVENOUS

## 2017-05-25 MED ORDER — BACITRACIN-NEOMYCIN-POLYMYXIN 400-5-5000 EX OINT
TOPICAL_OINTMENT | CUTANEOUS | Status: AC
  Filled 2017-05-25: qty 1

## 2017-05-25 MED ORDER — CHLORHEXIDINE GLUCONATE 4 % EX LIQD
60.0000 mL | Freq: Once | CUTANEOUS | Status: DC
Start: 2017-05-25 — End: 2017-05-25

## 2017-05-25 MED ORDER — PHENYLEPHRINE 40 MCG/ML (10ML) SYRINGE FOR IV PUSH (FOR BLOOD PRESSURE SUPPORT)
PREFILLED_SYRINGE | INTRAVENOUS | Status: DC | PRN
  Administered 2017-05-25 (×4): 80 ug via INTRAVENOUS

## 2017-05-25 MED ORDER — PHENYLEPHRINE 40 MCG/ML (10ML) SYRINGE FOR IV PUSH (FOR BLOOD PRESSURE SUPPORT)
PREFILLED_SYRINGE | INTRAVENOUS | Status: AC
  Filled 2017-05-25: qty 10

## 2017-05-25 MED ORDER — PROPOFOL 10 MG/ML IV BOLUS
INTRAVENOUS | Status: AC
  Filled 2017-05-25: qty 20

## 2017-05-25 MED ORDER — OXYCODONE-ACETAMINOPHEN 5-325 MG PO TABS: 1.0000 | ORAL_TABLET | ORAL | 0 refills | Status: DC | PRN

## 2017-05-25 MED ORDER — ACETAMINOPHEN 325 MG PO TABS: 650.0000 mg | ORAL_TABLET | ORAL | Status: DC | PRN

## 2017-05-25 MED ORDER — DEXAMETHASONE SODIUM PHOSPHATE 10 MG/ML IJ SOLN
INTRAMUSCULAR | Status: AC
  Filled 2017-05-25: qty 1

## 2017-05-25 MED ORDER — FLEET ENEMA 7-19 GM/118ML RE ENEM: 1.0000 | ENEMA | Freq: Once | RECTAL | Status: DC | PRN

## 2017-05-25 MED ORDER — IRBESARTAN 75 MG PO TABS
37.5000 mg | ORAL_TABLET | Freq: Every day | ORAL | Status: DC
  Administered 2017-05-25 – 2017-05-26 (×2): 37.5 mg via ORAL
  Filled 2017-05-25 (×2): qty 1

## 2017-05-25 MED ORDER — HYDROMORPHONE HCL 1 MG/ML IJ SOLN: 0.5000 mg | INTRAMUSCULAR | Status: DC | PRN

## 2017-05-25 MED ORDER — MENTHOL 3 MG MT LOZG
1.0000 | LOZENGE | OROMUCOSAL | Status: DC | PRN
Start: 2017-05-25 — End: 2017-05-26

## 2017-05-25 MED ORDER — SODIUM CHLORIDE 0.9 % IR SOLN
Status: AC
  Filled 2017-05-25: qty 500000

## 2017-05-25 MED ORDER — FENTANYL CITRATE (PF) 100 MCG/2ML IJ SOLN
INTRAMUSCULAR | Status: DC | PRN
Start: 2017-05-25 — End: 2017-05-25
  Administered 2017-05-25: 50 ug via INTRAVENOUS
  Administered 2017-05-25 (×2): 100 ug via INTRAVENOUS
  Administered 2017-05-25 (×2): 50 ug via INTRAVENOUS

## 2017-05-25 MED ORDER — DEXAMETHASONE SODIUM PHOSPHATE 10 MG/ML IJ SOLN
INTRAMUSCULAR | Status: DC | PRN
  Administered 2017-05-25: 10 mg via INTRAVENOUS

## 2017-05-25 MED ORDER — METHOCARBAMOL 500 MG PO TABS: 500.0000 mg | ORAL_TABLET | Freq: Four times a day (QID) | ORAL | Status: DC | PRN

## 2017-05-25 MED ORDER — ACETAMINOPHEN 650 MG RE SUPP: 650.0000 mg | RECTAL | Status: DC | PRN

## 2017-05-25 MED ORDER — MIDAZOLAM HCL 2 MG/2ML IJ SOLN
INTRAMUSCULAR | Status: AC
  Filled 2017-05-25: qty 2

## 2017-05-25 MED ORDER — CEFAZOLIN SODIUM-DEXTROSE 1-4 GM/50ML-% IV SOLN
1.0000 g | Freq: Three times a day (TID) | INTRAVENOUS | Status: DC
  Administered 2017-05-25 – 2017-05-26 (×2): 1 g via INTRAVENOUS
  Filled 2017-05-25 (×4): qty 50

## 2017-05-25 MED ORDER — LIDOCAINE 2% (20 MG/ML) 5 ML SYRINGE
INTRAMUSCULAR | Status: AC
  Filled 2017-05-25: qty 5

## 2017-05-25 MED ORDER — ROCURONIUM BROMIDE 10 MG/ML (PF) SYRINGE
PREFILLED_SYRINGE | INTRAVENOUS | Status: DC | PRN
  Administered 2017-05-25: 40 mg via INTRAVENOUS

## 2017-05-25 MED ORDER — CHLORHEXIDINE GLUCONATE 4 % EX LIQD: 60.0000 mL | Freq: Once | CUTANEOUS | Status: DC

## 2017-05-25 MED ORDER — METHOCARBAMOL 1000 MG/10ML IJ SOLN: 500.0000 mg | Freq: Four times a day (QID) | INTRAVENOUS | Status: DC | PRN

## 2017-05-25 MED ORDER — OXYCODONE-ACETAMINOPHEN 5-325 MG PO TABS: 2.0000 | ORAL_TABLET | ORAL | Status: DC | PRN

## 2017-05-25 MED ORDER — METHOCARBAMOL 500 MG PO TABS: 500.0000 mg | ORAL_TABLET | Freq: Four times a day (QID) | ORAL | 0 refills | Status: DC | PRN

## 2017-05-25 MED ORDER — HYDROCODONE-ACETAMINOPHEN 7.5-325 MG PO TABS: 1.0000 | ORAL_TABLET | ORAL | Status: DC | PRN

## 2017-05-25 MED ORDER — PROPOFOL 10 MG/ML IV BOLUS
INTRAVENOUS | Status: DC | PRN
  Administered 2017-05-25: 180 mg via INTRAVENOUS

## 2017-05-25 MED ORDER — LACTATED RINGERS IV SOLN: INTRAVENOUS | Status: DC

## 2017-05-25 MED ORDER — SODIUM CHLORIDE 0.9 % IR SOLN
Status: DC | PRN
  Administered 2017-05-25: 500 mL

## 2017-05-25 MED ORDER — LABETALOL HCL 5 MG/ML IV SOLN: 5.0000 mg | Freq: Once | INTRAVENOUS | Status: DC

## 2017-05-25 MED ORDER — THROMBIN 5000 UNITS EX SOLR
CUTANEOUS | Status: AC
  Filled 2017-05-25: qty 10000

## 2017-05-25 MED ORDER — BUPIVACAINE-EPINEPHRINE (PF) 0.5% -1:200000 IJ SOLN
INTRAMUSCULAR | Status: AC
  Filled 2017-05-25: qty 30

## 2017-05-25 MED ORDER — LEVOTHYROXINE SODIUM 112 MCG PO TABS
112.0000 ug | ORAL_TABLET | Freq: Every day | ORAL | Status: DC
  Administered 2017-05-26: 112 ug via ORAL
  Filled 2017-05-25: qty 1

## 2017-05-25 MED ORDER — SUGAMMADEX SODIUM 200 MG/2ML IV SOLN
INTRAVENOUS | Status: AC
  Filled 2017-05-25: qty 2

## 2017-05-25 MED ORDER — DULOXETINE HCL 60 MG PO CPEP
60.0000 mg | ORAL_CAPSULE | Freq: Every day | ORAL | Status: DC
  Administered 2017-05-26: 10:00:00 60 mg via ORAL
  Filled 2017-05-25: qty 1

## 2017-05-25 MED ORDER — FENTANYL CITRATE (PF) 100 MCG/2ML IJ SOLN: 25.0000 ug | INTRAMUSCULAR | Status: DC | PRN

## 2017-05-25 MED ORDER — ONDANSETRON HCL 4 MG PO TABS: 4.0000 mg | ORAL_TABLET | Freq: Four times a day (QID) | ORAL | Status: DC | PRN

## 2017-05-25 MED ORDER — BISACODYL 5 MG PO TBEC: 5.0000 mg | DELAYED_RELEASE_TABLET | Freq: Every day | ORAL | Status: DC | PRN

## 2017-05-25 MED ORDER — POLYETHYLENE GLYCOL 3350 17 G PO PACK: 17.0000 g | PACK | Freq: Every day | ORAL | Status: DC | PRN

## 2017-05-25 MED ORDER — FENTANYL CITRATE (PF) 250 MCG/5ML IJ SOLN
INTRAMUSCULAR | Status: AC
  Filled 2017-05-25: qty 5

## 2017-05-25 MED ORDER — ONDANSETRON HCL 4 MG/2ML IJ SOLN: 4.0000 mg | Freq: Four times a day (QID) | INTRAMUSCULAR | Status: DC | PRN

## 2017-05-25 MED ORDER — BACITRACIN-NEOMYCIN-POLYMYXIN 400-5-5000 EX OINT
TOPICAL_OINTMENT | CUTANEOUS | Status: DC | PRN
  Administered 2017-05-25: 1 via TOPICAL

## 2017-05-25 MED ORDER — BUPIVACAINE LIPOSOME 1.3 % IJ SUSP
INTRAMUSCULAR | Status: DC | PRN
  Administered 2017-05-25: 20 mL

## 2017-05-25 MED ORDER — SUGAMMADEX SODIUM 200 MG/2ML IV SOLN
INTRAVENOUS | Status: DC | PRN
  Administered 2017-05-25: 200 mg via INTRAVENOUS

## 2017-05-25 MED ORDER — PHENOL 1.4 % MT LIQD: 1.0000 | OROMUCOSAL | Status: DC | PRN

## 2017-05-25 MED ORDER — ROCURONIUM BROMIDE 50 MG/5ML IV SOSY
PREFILLED_SYRINGE | INTRAVENOUS | Status: AC
  Filled 2017-05-25: qty 5

## 2017-05-25 MED ORDER — CEFAZOLIN SODIUM-DEXTROSE 2-4 GM/100ML-% IV SOLN
2.0000 g | INTRAVENOUS | Status: AC
  Administered 2017-05-25: 2 g via INTRAVENOUS
  Filled 2017-05-25: qty 100

## 2017-05-25 MED ORDER — MIDAZOLAM HCL 5 MG/5ML IJ SOLN
INTRAMUSCULAR | Status: DC | PRN
  Administered 2017-05-25: 2 mg via INTRAVENOUS

## 2017-05-25 MED ORDER — ONDANSETRON HCL 4 MG/2ML IJ SOLN
INTRAMUSCULAR | Status: AC
  Filled 2017-05-25: qty 2

## 2017-05-25 MED ORDER — BUPIVACAINE LIPOSOME 1.3 % IJ SUSP
20.0000 mL | Freq: Once | INTRAMUSCULAR | Status: DC
  Filled 2017-05-25: qty 20

## 2017-05-25 MED ORDER — FOLIC ACID 1 MG PO TABS
1.0000 mg | ORAL_TABLET | Freq: Every day | ORAL | Status: DC
  Administered 2017-05-25 – 2017-05-26 (×2): 1 mg via ORAL
  Filled 2017-05-25 (×2): qty 1

## 2017-05-25 MED ORDER — VITAMIN B-1 100 MG PO TABS
100.0000 mg | ORAL_TABLET | Freq: Every day | ORAL | Status: DC
  Administered 2017-05-25 – 2017-05-26 (×2): 100 mg via ORAL
  Filled 2017-05-25 (×2): qty 1

## 2017-05-25 SURGICAL SUPPLY — 56 items
AGENT HMST SPONGE THK3/8 (HEMOSTASIS) ×1
APL SKNCLS STERI-STRIP NONHPOA (GAUZE/BANDAGES/DRESSINGS) ×1
BAG SPEC THK2 15X12 ZIP CLS (MISCELLANEOUS)
BAG ZIPLOCK 12X15 (MISCELLANEOUS) IMPLANT
BENZOIN TINCTURE PRP APPL 2/3 (GAUZE/BANDAGES/DRESSINGS) ×2 IMPLANT
CLEANER TIP ELECTROSURG 2X2 (MISCELLANEOUS) ×2 IMPLANT
COVER SURGICAL LIGHT HANDLE (MISCELLANEOUS) ×2 IMPLANT
DRAIN PENROSE 18X1/4 LTX STRL (WOUND CARE) IMPLANT
DRAPE MICROSCOPE LEICA (MISCELLANEOUS) ×2 IMPLANT
DRAPE POUCH INSTRU U-SHP 10X18 (DRAPES) ×2 IMPLANT
DRAPE SHEET LG 3/4 BI-LAMINATE (DRAPES) ×2 IMPLANT
DRAPE SURG 17X11 SM STRL (DRAPES) ×2 IMPLANT
DRSG ADAPTIC 3X8 NADH LF (GAUZE/BANDAGES/DRESSINGS) ×2 IMPLANT
DRSG PAD ABDOMINAL 8X10 ST (GAUZE/BANDAGES/DRESSINGS) ×8 IMPLANT
DURAPREP 26ML APPLICATOR (WOUND CARE) ×2 IMPLANT
ELECT BLADE TIP CTD 4 INCH (ELECTRODE) ×2 IMPLANT
ELECT REM PT RETURN 15FT ADLT (MISCELLANEOUS) ×2 IMPLANT
GAUZE SPONGE 4X4 12PLY STRL (GAUZE/BANDAGES/DRESSINGS) ×2 IMPLANT
GLOVE BIOGEL PI IND STRL 6.5 (GLOVE) ×1 IMPLANT
GLOVE BIOGEL PI IND STRL 7.5 (GLOVE) ×2 IMPLANT
GLOVE BIOGEL PI IND STRL 8.5 (GLOVE) ×1 IMPLANT
GLOVE BIOGEL PI INDICATOR 6.5 (GLOVE) ×1
GLOVE BIOGEL PI INDICATOR 7.5 (GLOVE) ×2
GLOVE BIOGEL PI INDICATOR 8.5 (GLOVE) ×1
GLOVE ECLIPSE 6.5 STRL STRAW (GLOVE) ×2 IMPLANT
GLOVE ECLIPSE 8.0 STRL XLNG CF (GLOVE) ×4 IMPLANT
GLOVE INDICATOR 6.5 STRL GRN (GLOVE) ×1 IMPLANT
GLOVE SURG SS PI 6.5 STRL IVOR (GLOVE) ×2 IMPLANT
GOWN STRL REUS W/ TWL LRG LVL3 (GOWN DISPOSABLE) ×1 IMPLANT
GOWN STRL REUS W/TWL LRG LVL3 (GOWN DISPOSABLE) ×4
GOWN STRL REUS W/TWL XL LVL3 (GOWN DISPOSABLE) ×4 IMPLANT
HEMOSTAT SPONGE AVITENE ULTRA (HEMOSTASIS) ×2 IMPLANT
KIT BASIN OR (CUSTOM PROCEDURE TRAY) ×2 IMPLANT
KIT POSITIONING SURG ANDREWS (MISCELLANEOUS) ×2 IMPLANT
MANIFOLD NEPTUNE II (INSTRUMENTS) ×2 IMPLANT
MARKER SKIN DUAL TIP RULER LAB (MISCELLANEOUS) ×2 IMPLANT
NDL SPNL 18GX3.5 QUINCKE PK (NEEDLE) ×2 IMPLANT
NEEDLE HYPO 22GX1.5 SAFETY (NEEDLE) ×4 IMPLANT
NEEDLE SPNL 18GX3.5 QUINCKE PK (NEEDLE) ×4 IMPLANT
PACK LAMINECTOMY ORTHO (CUSTOM PROCEDURE TRAY) ×2 IMPLANT
PAD ABD 8X10 STRL (GAUZE/BANDAGES/DRESSINGS) ×3 IMPLANT
PATTIES SURGICAL .5 X.5 (GAUZE/BANDAGES/DRESSINGS) IMPLANT
PATTIES SURGICAL .75X.75 (GAUZE/BANDAGES/DRESSINGS) ×2 IMPLANT
PATTIES SURGICAL 1X1 (DISPOSABLE) ×2 IMPLANT
PIN SAFETY NICK PLATE  2 MED (MISCELLANEOUS)
PIN SAFETY NICK PLATE 2 MED (MISCELLANEOUS) IMPLANT
SPONGE LAP 4X18 X RAY DECT (DISPOSABLE) ×4 IMPLANT
STAPLER VISISTAT 35W (STAPLE) ×2 IMPLANT
SUT VIC AB 1 CT1 27 (SUTURE) ×4
SUT VIC AB 1 CT1 27XBRD ANTBC (SUTURE) ×2 IMPLANT
SUT VIC AB 2-0 CT1 27 (SUTURE) ×4
SUT VIC AB 2-0 CT1 TAPERPNT 27 (SUTURE) ×2 IMPLANT
SYR 20CC LL (SYRINGE) ×4 IMPLANT
TAPE CLOTH SURG 6X10 WHT LF (GAUZE/BANDAGES/DRESSINGS) ×2 IMPLANT
TOWEL OR 17X26 10 PK STRL BLUE (TOWEL DISPOSABLE) ×2 IMPLANT
TRAY FOLEY CATH SILVER 14FR (SET/KITS/TRAYS/PACK) ×1 IMPLANT

## 2017-05-25 NOTE — Discharge Instructions (Signed)
For the first three days, remove your dressing, tape a piece of saran wrap over your incision Take your shower, then remove the saran wrap and put a clean dressing on After three days you can shower without the saran wrap.  No lifting or driving  Call Dr. Gladstone Lighter if any wound complications or temperature of 101 degrees F or over.  Call the office for an appointment to see Dr. Gladstone Lighter in two weeks: [phone] and ask for Dr. Charlestine Night nurse, Brunilda Payor.

## 2017-05-25 NOTE — Transfer of Care (Signed)
Immediate Anesthesia Transfer of Care Note  Patient: [name]  Procedure(s) Performed: Procedure(s): CENTRAL DECOMPRESSIVE LUMBAR LAMINECTOMY FOR SPINAL STENOSIS L3-L4, FORAMINOTOMY FOR THE L4 ROOT AND L5 ROOT (N/A)  Patient Location: PACU  Anesthesia Type:General  Level of Consciousness: awake, alert  and oriented  Airway & Oxygen Therapy: Patient Spontanous Breathing and Patient connected to face mask oxygen  Post-op Assessment: Report given to RN and Post -op Vital signs reviewed and stable  Post vital signs: Reviewed and stable  Last Vitals:  Vitals:   05/25/17 1223  BP: (!) 167/90  Pulse: (!) 104  Resp: 16  Temp: 37.2 C    Last Pain:  Vitals:   05/25/17 1223  TempSrc: Oral      Patients Stated Pain Goal: 3 (00/37/04 8889)  Complications: No apparent anesthesia complications

## 2017-05-25 NOTE — H&P (Signed)
[name] is an 71 y.o. female.   Chief Complaint: Pain in Left leg with weakness. HPI: She has developed a progressive pain and weakness in her Left Leg.  Past Medical History:  Diagnosis Date  . Alcoholism ([place]) 01/09/2012  . Anemia   . Anxiety   . B12 deficiency   . Carotid artery narrowing 10/03/2012   On the right.  . Chronic kidney disease (CKD), stage II (mild)   . COPD (chronic obstructive pulmonary disease) ([place])   . Depression   . Headache(784.0)   . Hyperlipidemia   . Hypertension   . Hyponatremia 01/09/2012  . Hypothyroid   . Menopause   . Mini stroke ([place])   . Orthostatic syncope 10/02/2012  . Pneumonia   . SBO (small bowel obstruction) ([place]) 06/28/2016  . Shortness of breath   . Stroke [facility]) 2010   no deficits    Past Surgical History:  Procedure Laterality Date  . AGILE CAPSULE N/A 02/22/2016   dummy capsule remained in the distal ileum  . APPENDECTOMY    . CATARACT EXTRACTION W/PHACO  06/20/2012   Procedure: CATARACT EXTRACTION PHACO AND INTRAOCULAR LENS PLACEMENT (IOC);  Surgeon: Tonny Branch, MD;  Location: [facility] ORS;  Service: Ophthalmology;  Laterality: Right;  CDE:10.66  . CATARACT EXTRACTION W/PHACO  07/11/2012   Procedure: CATARACT EXTRACTION PHACO AND INTRAOCULAR LENS PLACEMENT (IOC);  Surgeon: Tonny Branch, MD;  Location: [facility] ORS;  Service: Ophthalmology;  Laterality: Left;  CDE: 12.69  . COLONOSCOPY N/A 12/17/2015   RMR: Marked circumferential ulceration of the ascending colon/Ileocecal valve ulceration with biopsy most consistent with NSAID injury versus inflammatory bowel disease, pancolonoc diverticulosis redundant colon  . ESOPHAGOGASTRODUODENOSCOPY N/A 12/17/2015   PIR:JJOACZYSA gastric polyp, biopsy benign  . KNEE ARTHROSCOPY Right 09/2016  . TONSILLECTOMY      Family History  Problem Relation Age of Onset  . GER disease Mother   . Hypertension Maternal Grandmother    Social History:  reports that she quit smoking about 24 years ago. Her smoking  use included Cigarettes. She has a 60.00 pack-year smoking history. She has never used smokeless tobacco. She reports that she does not drink alcohol or use drugs.  Allergies:  Allergies  Allergen Reactions  . Ciprofloxacin Other (See Comments)    Caused pt to have delusional thoughts and hallucinations  . Procardia [Nifedipine] Swelling    Edema    . Norvasc [Amlodipine Besylate] Hives    Medications Prior to Admission  Medication Sig Dispense Refill  . acetaminophen (TYLENOL) 500 MG tablet Take 1 tablet (500 mg total) by mouth 2 (two) times daily. Pain    . aspirin EC 81 MG tablet Take 81 mg by mouth daily.    . Coenzyme Q10 (CO Q-10) 100 MG CAPS Take 100 mg by mouth daily. 100 each 10  . DULoxetine (CYMBALTA) 60 MG capsule Take 1 capsule (60 mg total) by mouth daily. 90 capsule 1  . folic acid (FOLVITE) 1 MG tablet Take 1 tablet (1 mg total) by mouth daily. 90 tablet 1  . levothyroxine (SYNTHROID, LEVOTHROID) 112 MCG tablet Take 1 tablet (112 mcg total) by mouth daily. 90 tablet 1  . rosuvastatin (CRESTOR) 10 MG tablet Take 1 tablet (10 mg total) by mouth daily. 90 tablet 3  . thiamine (VITAMIN B-1) 100 MG tablet Take 1 tablet (100 mg total) by mouth daily. 90 tablet 3  . valsartan (DIOVAN) 160 MG tablet Take 2 tablets (320 mg total) by mouth daily. (Patient taking differently:  Take 160 mg by mouth 2 (two) times daily. ) 180 tablet 0    No results found for this or any previous visit (from the past 48 hour(s)). No results found.  Review of Systems  Constitutional: Negative.   HENT: Negative.   Eyes: Negative.   Respiratory: Negative.   Cardiovascular: Negative.   Gastrointestinal: Negative.   Genitourinary: Negative.   Musculoskeletal: Positive for back pain.  Skin: Negative.   Neurological: Positive for focal weakness.  Endo/Heme/Allergies: Negative.   Psychiatric/Behavioral: Negative.     Blood pressure (!) 167/90, pulse (!) 104, temperature 98.9 F (37.2 C),  temperature source Oral, resp. rate 16, height 5\' 9"  (1.753 m), weight 72.8 kg (160 lb 8 oz), last menstrual period 08/21/2013, SpO2 99 %. Physical Exam  Constitutional: She appears well-developed.  HENT:  Head: Normocephalic.  Eyes: Pupils are equal, round, and reactive to light.  Neck: Normal range of motion.  Cardiovascular: Normal rate.   Respiratory: Effort normal.  GI: Soft.  Musculoskeletal: She exhibits tenderness.  Neurological:  Weakness of left foot Dorsiflexors.     Assessment/Plan Central Decompressive Lumbar Laminectomy and Foraminotomy l-3-L-4.  Tobi Bastos, MD 05/25/2017, 2:06 PM

## 2017-05-25 NOTE — Brief Op Note (Signed)
05/25/2017  4:14 PM  PATIENT:  [name]  71 y.o. female  PRE-OPERATIVE DIAGNOSIS:  L3-L4 spinal stenosis,Severe;Foraminal Stenosis involving the L-4 and L-5 Nerve Roots on the Left. Partial Foot Drop on the Left.   POST-OPERATIVE DIAGNOSIS:  Same as Pre-Op  PROCEDURE:  Procedure(s): CENTRAL DECOMPRESSIVE LUMBAR LAMINECTOMY FOR SPINAL STENOSIS L3-L4, FORAMINOTOMY FOR THE L4 ROOT AND L5 ROOT (N/A) on the Left  SURGEON:  Surgeon(s) and Role:    Latanya Maudlin, MD - Primary  PHYSICIAN ASSISTANT: Ardeen Jourdain PA  ASSISTANTS: Ardeen Jourdain PA  ANESTHESIA:   general  EBL:  Total I/O In: 1000 [I.V.:1000] Out: -   BLOOD ADMINISTERED:none  DRAINS: none   LOCAL MEDICATIONS USED:  MARCAINE 20cc of 0.50% with Epinephrine at start of the case and 20cc of Exparel at the end of the case.     SPECIMEN:  No Specimen  DISPOSITION OF SPECIMEN:  No Specimen  COUNTS:  YES  TOURNIQUET:  * No tourniquets in log *  DICTATION: .Other Dictation: Dictation Number [id number]  PLAN OF CARE: Admit for overnight observation  PATIENT DISPOSITION:  PACU - hemodynamically stable.   Delay start of Pharmacological VTE agent (>24hrs) due to surgical blood loss or risk of bleeding: yes

## 2017-05-25 NOTE — Op Note (Signed)
[name], [name]                ACCOUNT NO.:  [id number]  MEDICAL RECORD NO.:  [id number]  LOCATION:                                 FACILITY:  PHYSICIAN:  Kipp Brood. Raiyan Dalesandro, M.D.DATE OF BIRTH:  1946/03/16  DATE OF PROCEDURE:  05/25/2017 DATE OF DISCHARGE:                              OPERATIVE REPORT   SURGEON:  Kipp Brood. Gladstone Lighter, M.D.  ASSISTANT:  Ardeen Jourdain, [place].  PREOPERATIVE DIAGNOSES: 1. Severe spinal stenosis at L3-4. 2. Foraminal stenosis involving the L4 root on the left. 3. Foraminal stenosis involving the L5 root on the left. 4. Partial footdrop on the left.  POSTOPERATIVE DIAGNOSES: 1. Severe spinal stenosis at L3-4. 2. Foraminal stenosis involving the L4 root on the left. 3. Foraminal stenosis involving the L5 root on the left. 4. Partial footdrop on the left.  OPERATION: 1. Complete decompressive lumbar laminectomy at L3-L4 for severe     spinal stenosis. 2. Foraminotomy for the L4 root on the left. 3. Foraminotomy for the L5 root on the left.  DESCRIPTION OF PROCEDURE:  Under general anesthesia, the patient on spinal frame, routine orthopedic prep and draping of the lower back carried out.  Appropriate time-out was first carried out, also marked the appropriate left leg in the holding area because that was where she was symptomatic.  At this time, 2 needles were placed in the back for localization purposes and x-ray was taken.  The patient had 2 g of IV Ancef as well.  Once this was done, an incision was made over the L3-4, L4-5 interspace in the usual fashion.  Bleeders were identified and cauterized.  Prior to making incision, I injected 20 mL of 0.5% Marcaine and epinephrine to control bleeding I injected into the soft tissue. After the incision, I then separated the muscle from the lamina and spinous process in the usual fashion.  A Kocher clamp was placed in place.  I identified the spinous process.  At this time, we then inserted the  [facility] retractors.  The microscope was brought in and then we continued on to do our complete decompressive lumbar laminectomy at L3-L4.  We then went down and removed the thickened ligamentum flavum.  I protected the dura with the cottonoids and then dissected up above the dura to dissect the ligamentum flavum free.  We then completed the excision of the ligamentum flavum.  I went down distally as far distally and proximally until we were wide open for the decompression. I was able to easily pass a hockey-stick proximally and distally.  I went out and decompressed the lateral recess, especially on the left and I did foraminotomies for the L4 and the L5 root on the left. Thoroughly, I irrigated out the area.  Good hemostasis was maintained. I loosely applied some plain Ultrafoam.  Prior to finishing the procedure, I did make sure that we inserted the instruments and checked the level again.  She had a retrolisthesis at L3-4 as well.  We then irrigated the area as I mentioned, loosely applied some Ultrafoam, and then closed the wound layers in usual fashion except I left a small distal deep and proximal part of the wound  open for drainage purposes.  Subcu was closed with #1 Vicryl, skin with metal staples.  Sterile dressing was applied.  Prior to closing the wound, I injected 20 mL of Exparel into the soft tissue structures.  Sterile dressings were applied.          ______________________________ Kipp Brood Gladstone Lighter, M.D.     RAG/MEDQ  D:  05/25/2017  T:  05/25/2017  Job:  [id number]

## 2017-05-25 NOTE — Interval H&P Note (Signed)
History and Physical Interval Note:  05/25/2017 2:09 PM  [name]  has presented today for surgery, with the diagnosis of L3-L4 spinal stenosis   The various methods of treatment have been discussed with the patient and family. After consideration of risks, benefits and other options for treatment, the patient has consented to  Procedure(s): CENTRAL DECOMPRESSION L3-L4 (N/A) as a surgical intervention .  The patient's history has been reviewed, patient examined, no change in status, stable for surgery.  I have reviewed the patient's chart and labs.  Questions were answered to the patient's satisfaction.     Kelly Eisler A

## 2017-05-25 NOTE — Anesthesia Procedure Notes (Signed)
Procedure Name: Intubation Date/Time: 05/25/2017 2:49 PM Performed by: Noralyn Pick D Pre-anesthesia Checklist: Patient identified, Emergency Drugs available, Suction available and Patient being monitored Patient Re-evaluated:Patient Re-evaluated prior to inductionOxygen Delivery Method: Circle system utilized Preoxygenation: Pre-oxygenation with 100% oxygen Intubation Type: IV induction Ventilation: Mask ventilation without difficulty Laryngoscope Size: Mac Grade View: Grade II Tube type: Oral Number of attempts: 1 Airway Equipment and Method: Stylet Placement Confirmation: ETT inserted through vocal cords under direct vision,  positive ETCO2 and breath sounds checked- equal and bilateral Tube secured with: Tape Dental Injury: Teeth and Oropharynx as per pre-operative assessment

## 2017-05-25 NOTE — Anesthesia Preprocedure Evaluation (Signed)
Anesthesia Evaluation  Patient identified by MRN, date of birth, ID band Patient awake    Reviewed: Allergy & Precautions, H&P , Patient's Chart, lab work & pertinent test results, reviewed documented beta blocker date and time   Airway Mallampati: II  TM Distance: >3 FB Neck ROM: full    Dental no notable dental hx.    Pulmonary former smoker,    Pulmonary exam normal breath sounds clear to auscultation       Cardiovascular hypertension,  Rhythm:regular Rate:Normal     Neuro/Psych    GI/Hepatic   Endo/Other    Renal/GU      Musculoskeletal   Abdominal   Peds  Hematology   Anesthesia Other Findings Hypertension     Mini stroke Shortness of breath      Pneumonia   Anemia    Depression   Stroke [facility]) 2010 no deficits  Alcoholism ([place]) 01/09/2012  Hyponatremia 01/09/2012   COPD  Carotid artery narrowing On the right. Chronic kidney disease, stage II (mild)   SBO (small bowel obstruct      Reproductive/Obstetrics                             Anesthesia Physical Anesthesia Plan  ASA: II  Anesthesia Plan: General   Post-op Pain Management:    Induction: Intravenous  PONV Risk Score and Plan:   Airway Management Planned: Oral ETT  Additional Equipment:   Intra-op Plan:   Post-operative Plan: Extubation in OR  Informed Consent: I have reviewed the patients History and Physical, chart, labs and discussed the procedure including the risks, benefits and alternatives for the proposed anesthesia with the patient or authorized representative who has indicated his/her understanding and acceptance.   Dental Advisory Given  Plan Discussed with: CRNA and Surgeon  Anesthesia Plan Comments: (  )        Anesthesia Quick Evaluation

## 2017-05-26 DIAGNOSIS — M48062 Spinal stenosis, lumbar region with neurogenic claudication: Secondary | ICD-10-CM | POA: Diagnosis not present

## 2017-05-26 NOTE — Evaluation (Signed)
Physical Therapy Evaluation Patient Details Name: [name] MRN: [id number] DOB: 1946-10-01 Today's Date: 05/26/2017   History of Present Illness  71 yo female admitted with left leg pain and weakness due to spinal stenosis.  She underwent  complete decompression with laminectomy on 05/25/2017  Clinical Impression  Pt is modified independent in walking with a rolling walker and steps post surgery.  She has some residual weakness in left leg that is apparent when she tries to walk without a device, but expect that to resolve as she heals.  She was instructed in a few exercises to help this, to continue walking with a walker for now and progress to no device as her leg feels stronger.  Do not think she needs follow up PT     Follow Up Recommendations No PT follow up    Equipment Recommendations  None recommended by PT    Recommendations for Other Services       Precautions / Restrictions Precautions Precautions: Back Precaution Comments: reviewed back precautions, pt has no brace  Restrictions Weight Bearing Restrictions: No      Mobility  Bed Mobility               General bed mobility comments: pt sitting up in chair.  Transfers Overall transfer level: Independent               General transfer comment: Pushes up with arms. Pt able to remember her back precautions   Ambulation/Gait Ambulation/Gait assistance: Modified independent (Device/Increase time) Ambulation Distance (Feet): 200 Feet Assistive device: Rolling walker (2 wheeled);None Gait Pattern/deviations: Step-through pattern     General Gait Details: occasional cues to keep back straight and chest up in gait, reminded pt to have RW  raised up so she doesn't have to lean over it during gait   Stairs Stairs: Yes Stairs assistance: Modified independent (Device/Increase time) Stair Management: One rail Left;Alternating pattern;Step to pattern;Forwards Number of Stairs: 5 General stair comments:  needs to step up with right leg one step at a time on the higher step   Wheelchair Mobility    Modified Rankin (Stroke Patients Only)       Balance                                             Pertinent Vitals/Pain Pain Assessment: No/denies pain    Home Living Family/patient expects to be discharged to:: Private residence Living Arrangements: Children   Type of Home: House Home Access: Stairs to enter Entrance Stairs-Rails: Left Entrance Stairs-Number of Steps: 3 Home Layout: One level Home Equipment: [place] - 2 wheels;Walker - 4 wheels;Cane - single point      Prior Function Level of Independence: Independent               Hand Dominance        Extremity/Trunk Assessment        Lower Extremity Assessment Lower Extremity Assessment: LLE deficits/detail LLE Deficits / Details: Pt has 5/5 strength to break test of left dorsiflexion, but had some difficulty with ankle eversion.  When walking withouth an assistive device, she reports some difficulty moving left leg, but it improves as she concentrates on it. She has no difficulty moving left leg if she uses a RW        Communication   Communication: No difficulties  Cognition Arousal/Alertness: Awake/alert Behavior During  Therapy: WFL for tasks assessed/performed Overall Cognitive Status: Within Functional Limits for tasks assessed                                        General Comments General comments (skin integrity, edema, etc.): pt with dressing on  back     Exercises Other Exercises Other Exercises: ankle dorsiflexion and eversion in sitting and standing position  Other Exercises: weight shiftin onto left leg  Other Exercises: continue walking at home with decreasing use of assistive device as she improves    Assessment/Plan    PT Assessment Patent does not need any further PT services  PT Problem List         PT Treatment Interventions      PT Goals  (Current goals can be found in the Care Plan section)  Acute Rehab PT Goals Patient Stated Goal: to go home  PT Goal Formulation: With patient Time For Goal Achievement: 05/26/17 Potential to Achieve Goals: Good    Frequency     Barriers to discharge        Co-evaluation               AM-PAC PT "6 Clicks" Daily Activity  Outcome Measure Difficulty turning over in bed (including adjusting bedclothes, sheets and blankets)?: A Little Difficulty moving from lying on back to sitting on the side of the bed? : A Little Difficulty sitting down on and standing up from a chair with arms (e.g., wheelchair, bedside commode, etc,.)?: A Little Help needed moving to and from a bed to chair (including a wheelchair)?: None Help needed walking in hospital room?: None Help needed climbing 3-5 steps with a railing? : None 6 Click Score: 21    End of Session Equipment Utilized During Treatment: Gait belt Activity Tolerance: Patient tolerated treatment well;No increased pain Patient left: in chair;with family/visitor present Nurse Communication:  (pt ready for discharge ) PT Visit Diagnosis: Muscle weakness (generalized) (M62.81);Difficulty in walking, not elsewhere classified (R26.2)    Time: 8315-1761 PT Time Calculation (min) (ACUTE ONLY): 30 min   Charges:   PT Evaluation $PT Eval Low Complexity: 1 Procedure     PT G Codes:   PT G-Codes **NOT FOR INPATIENT CLASS** Functional Assessment Tool Used: AM-PAC 6 Clicks Basic Mobility;Clinical judgement Functional Limitation: Mobility: Walking and moving around Mobility: Walking and Moving Around Current Status (Y0737): At least 20 percent but less than 40 percent impaired, limited or restricted Mobility: Walking and Moving Around Goal Status [phone]): At least 20 percent but less than 40 percent impaired, limited or restricted Mobility: Walking and Moving Around Discharge Status [phone]): At least 20 percent but less than 40 percent  impaired, limited or restricted  Helene Kelp K. Owens Shark, PT   Norwood Levo 05/26/2017, 11:37 AM

## 2017-05-26 NOTE — Evaluation (Addendum)
Occupational Therapy One Time Evaluation Patient Details Name: [name] MRN: [id number] DOB: 1946/11/10 Today's Date: 05/26/2017    History of Present Illness 71 yo female admitted with left leg pain and weakness due to spinal stenosis.  She underwent  complete decompression with laminectomy on 05/25/2017   Clinical Impression   Pt doing well and ready for d/c home. All instruction regarding back precautions given to pt and her son. Issued back care handout and reviewed all information. No further acute OT indicated.    Follow Up Recommendations  No OT follow up;Supervision/Assistance - 24 hour    Equipment Recommendations  None recommended by OT    Recommendations for Other Services       Precautions / Restrictions Precautions Precautions: Back Precaution Comments: Educated on all back precautions.  Restrictions Weight Bearing Restrictions: No      Mobility Bed Mobility Overal bed mobility: Needs Assistance Bed Mobility: Rolling;Sidelying to Sit Rolling: Supervision Sidelying to sit: Supervision       General bed mobility comments: supervision for back precautions and cues for technique.  Transfers Overall transfer level: Needs assistance Equipment used: Rolling walker (2 wheeled) Transfers: Sit to/from Stand Sit to Stand: Min assist         General transfer comment: min assist from comfort height commode with bar. min guard from EOB with cues.    Balance                                           ADL either performed or assessed with clinical judgement   ADL Overall ADL's : Needs assistance/impaired Eating/Feeding: Independent;Sitting   Grooming: Wash/dry hands;Min guard;Standing   Upper Body Bathing: Set up;Supervision/ safety;Sitting   Lower Body Bathing: Min guard;Sit to/from stand   Upper Body Dressing : Supervision/safety;Set up;Sitting   Lower Body Dressing: Min guard;Sit to/from stand   Toilet Transfer: Minimal  assistance;Ambulation;RW;Comfort height toilet;Grab bars   Toileting- Clothing Manipulation and Hygiene: Supervision/safety;Sit to/from stand         General ADL Comments: Pt requring min assist to boost to standing from comfort height commode with use of bar. Educated on 3in1 option but pt doesnt think she has enough room to have one in her bathroom.  Pt's son offered to pull pt to standing but educated pt/son that it is better to help her under her arm with slight boost rather than pulling on her arm in front of her. Advised pt to sponge bathe a few days initially before attempting tub transfer to gain some more strength in her LEs before attempting to step over tub. Pt states her L knee is still sometimes stiff even after her knee surgery last year. Educated on sequence for LB dressing and to cross LEs up to start clothing and not bend forward. Pt needed occasional cues to adhere to back precautions. Instructed on how to perform side step over tub edge once pt is ready for tub transfer.     Vision Patient Visual Report: No change from baseline       Perception     Praxis      Pertinent Vitals/Pain Pain Assessment: No/denies pain     Hand Dominance     Extremity/Trunk Assessment Upper Extremity Assessment Upper Extremity Assessment: Overall WFL for tasks assessed   Lower Extremity Assessment Lower Extremity Assessment: LLE deficits/detail LLE Deficits / Details: Pt has 5/5 strength to  break test of left dorsiflexion, but had some difficulty with ankle eversion.  When walking withouth an assistive device, she reports some difficulty moving left leg, but it improves as she concentrates on it. She has no difficulty moving left leg if she uses a Geophysical data processor Communication: No difficulties   Cognition Arousal/Alertness: Awake/alert Behavior During Therapy: WFL for tasks assessed/performed Overall Cognitive Status: Within Functional Limits for tasks  assessed                                     General Comments  pt with dressing on  back     Exercises Exercises: Other exercises Other Exercises Other Exercises: ankle dorsiflexion and eversion in sitting and standing position  Other Exercises: weight shiftin onto left leg  Other Exercises: continue walking at home with decreasing use of assistive device as she improves    Shoulder Instructions      Home Living Family/patient expects to be discharged to:: Private residence Living Arrangements: Children   Type of Home: House Home Access: Stairs to enter Technical brewer of Steps: 3 Entrance Stairs-Rails: Left Home Layout: One level     Bathroom Shower/Tub: Teacher, early years/pre: Handicapped height     Home Equipment: Environmental consultant - 2 wheels;Walker - 4 wheels;Cane - single point;Shower seat          Prior Functioning/Environment Level of Independence: Independent                 OT Problem List: Decreased knowledge of use of DME or AE;Decreased strength      OT Treatment/Interventions:      OT Goals(Current goals can be found in the care plan section) Acute Rehab OT Goals Patient Stated Goal: to go home   OT Frequency:     Barriers to D/C:            Co-evaluation              AM-PAC PT "6 Clicks" Daily Activity     Outcome Measure Help from another person eating meals?: None Help from another person taking care of personal grooming?: None Help from another person toileting, which includes using toliet, bedpan, or urinal?: A Little Help from another person bathing (including washing, rinsing, drying)?: A Little Help from another person to put on and taking off regular upper body clothing?: None Help from another person to put on and taking off regular lower body clothing?: A Little 6 Click Score: 21   End of Session Equipment Utilized During Treatment: Rolling walker  Activity Tolerance: Patient tolerated  treatment well Patient left: in chair;with call bell/phone within reach;with family/visitor present  OT Visit Diagnosis: Muscle weakness (generalized) (M62.81)                Time: 3500-9381 OT Time Calculation (min): 35 min Charges:  OT General Charges $OT Visit: 1 Procedure OT Evaluation $OT Eval Low Complexity: 1 Procedure OT Treatments $Therapeutic Activity: 8-22 mins G-Codes: OT G-codes **NOT FOR INPATIENT CLASS** Functional Assessment Tool Used: Clinical judgement Functional Limitation: Self care Self Care Current Status (W2993): At least 1 percent but less than 20 percent impaired, limited or restricted Self Care Goal Status (Z1696): At least 1 percent but less than 20 percent impaired, limited or restricted Self Care Discharge Status [phone]): At least 1 percent but less than 20 percent impaired, limited  or restricted     Philippa Chester 05/26/2017, 12:23 PM  [phone]

## 2017-05-26 NOTE — Progress Notes (Signed)
Subjective: 1 Day Post-Op Procedure(s) (LRB): CENTRAL DECOMPRESSIVE LUMBAR LAMINECTOMY FOR SPINAL STENOSIS L3-L4, FORAMINOTOMY FOR THE L4 ROOT AND L5 ROOT (N/A) Patient reports pain as 0 on 0-10 scale.  No further leg pain and her left foot function is now normal.  Objective: Vital signs in last 24 hours: Temp:  [98 F (36.7 C)-98.9 F (37.2 C)] 98 F (36.7 C) (06/16 0526) Pulse Rate:  [84-115] 91 (06/16 0526) Resp:  [12-16] 15 (06/16 0526) BP: (156-189)/(71-90) 161/75 (06/16 0526) SpO2:  [95 %-100 %] 98 % (06/16 0526) Weight:  [72.8 kg (160 lb 8 oz)] 72.8 kg (160 lb 8 oz) (06/15 1238)  Intake/Output from previous day: 06/15 0701 - 06/16 0700 In: 3098.3 [P.O.:600; I.V.:2498.3] Out: 1200 [Urine:1200] Intake/Output this shift: No intake/output data recorded.  No results for input(s): HGB in the last 72 hours. No results for input(s): WBC, RBC, HCT, PLT in the last 72 hours. No results for input(s): NA, K, CL, CO2, BUN, CREATININE, GLUCOSE, CALCIUM in the last 72 hours. No results for input(s): LABPT, INR in the last 72 hours.  Neurologically intact  Assessment/Plan: 1 Day Post-Op Procedure(s) (LRB): CENTRAL DECOMPRESSIVE LUMBAR LAMINECTOMY FOR SPINAL STENOSIS L3-L4, FORAMINOTOMY FOR THE L4 ROOT AND L5 ROOT (N/A) Discharge Home.  Raydell Maners A 05/26/2017, 7:24 AM

## 2017-05-28 ENCOUNTER — Encounter (HOSPITAL_COMMUNITY): Payer: Self-pay | Admitting: Orthopedic Surgery

## 2017-05-28 NOTE — Anesthesia Postprocedure Evaluation (Signed)
Anesthesia Post Note  Patient: SHANAI LARTIGUE  Procedure(s) Performed: Procedure(s) (LRB): CENTRAL DECOMPRESSIVE LUMBAR LAMINECTOMY FOR SPINAL STENOSIS L3-L4, FORAMINOTOMY FOR THE L4 ROOT AND L5 ROOT (N/A)     Patient location during evaluation: PACU Anesthesia Type: General Level of consciousness: awake and alert Pain management: pain level controlled Vital Signs Assessment: post-procedure vital signs reviewed and stable Respiratory status: spontaneous breathing, nonlabored ventilation, respiratory function stable and patient connected to nasal cannula oxygen Cardiovascular status: blood pressure returned to baseline and stable Postop Assessment: no signs of nausea or vomiting Anesthetic complications: no    Last Vitals:  Vitals:   05/26/17 0526 05/26/17 1000  BP: (!) 161/75 (!) 164/78  Pulse: 91 95  Resp: 15 18  Temp: 36.7 C 36.9 C    Last Pain:  Vitals:   05/26/17 1000  TempSrc: Oral  PainSc:                  Riccardo Dubin

## 2017-05-28 NOTE — Discharge Summary (Signed)
Physician Discharge Summary   Patient ID: [name] MRN: [id number] DOB/AGE: 70-Sep-1947 70 y.o.  Admit date: 05/25/2017 Discharge date: 05/26/2017  Primary Diagnosis: Lumbar spinal stenosis  Admission Diagnoses:  Past Medical History:  Diagnosis Date  . Alcoholism ([place]) 01/09/2012  . Anemia   . Anxiety   . B12 deficiency   . Carotid artery narrowing 10/03/2012   On the right.  . Chronic kidney disease (CKD), stage II (mild)   . COPD (chronic obstructive pulmonary disease) ([place])   . Depression   . Headache(784.0)   . Hyperlipidemia   . Hypertension   . Hyponatremia 01/09/2012  . Hypothyroid   . Menopause   . Mini stroke ([place])   . Orthostatic syncope 10/02/2012  . Pneumonia   . SBO (small bowel obstruction) ([place]) 06/28/2016  . Shortness of breath   . Stroke [facility]) 2010   no deficits   Discharge Diagnoses:   Active Problems:   Spinal stenosis, lumbar region with neurogenic claudication  Estimated body mass index is 23.7 kg/m as calculated from the following:   Height as of this encounter: 5' 9"  (1.753 m).   Weight as of this encounter: 72.8 kg (160 lb 8 oz).  Procedure:  Procedure(s) (LRB): CENTRAL DECOMPRESSIVE LUMBAR LAMINECTOMY FOR SPINAL STENOSIS L3-L4, FORAMINOTOMY FOR THE L4 ROOT AND L5 ROOT (N/A)   Consults: None  HPI: The patient is a 71 year old female who presented with the chief complaint of low back pain with progressively worsening pain and weakness in the left leg. CT myelogram showed severe spinal stenosis at L3-L4 and L4-L5.  Laboratory Data: Hospital Outpatient Visit on 05/18/2017  Component Date Value Ref Range Status  . aPTT 05/18/2017 29  24 - 36 seconds Final  . WBC 05/18/2017 8.0  4.0 - 10.5 K/uL Final  . RBC 05/18/2017 4.20  3.87 - 5.11 MIL/uL Final  . Hemoglobin 05/18/2017 13.0  12.0 - 15.0 g/dL Final  . HCT 05/18/2017 38.5  36.0 - 46.0 % Final  . MCV 05/18/2017 91.7  78.0 - 100.0 fL Final  . MCH 05/18/2017 31.0  26.0 - 34.0 pg Final    . MCHC 05/18/2017 33.8  30.0 - 36.0 g/dL Final  . RDW 05/18/2017 13.3  11.5 - 15.5 % Final  . Platelets 05/18/2017 214  150 - 400 K/uL Final  . Neutrophils Relative % 05/18/2017 68  % Final  . Neutro Abs 05/18/2017 5.4  1.7 - 7.7 K/uL Final  . Lymphocytes Relative 05/18/2017 24  % Final  . Lymphs Abs 05/18/2017 1.9  0.7 - 4.0 K/uL Final  . Monocytes Relative 05/18/2017 7  % Final  . Monocytes Absolute 05/18/2017 0.5  0.1 - 1.0 K/uL Final  . Eosinophils Relative 05/18/2017 1  % Final  . Eosinophils Absolute 05/18/2017 0.1  0.0 - 0.7 K/uL Final  . Basophils Relative 05/18/2017 0  % Final  . Basophils Absolute 05/18/2017 0.0  0.0 - 0.1 K/uL Final  . Sodium 05/18/2017 138  135 - 145 mmol/L Final  . Potassium 05/18/2017 3.7  3.5 - 5.1 mmol/L Final  . Chloride 05/18/2017 104  101 - 111 mmol/L Final  . CO2 05/18/2017 25  22 - 32 mmol/L Final  . Glucose, Bld 05/18/2017 78  65 - 99 mg/dL Final  . BUN 05/18/2017 19  6 - 20 mg/dL Final  . Creatinine, Ser 05/18/2017 0.88  0.44 - 1.00 mg/dL Final  . Calcium 05/18/2017 9.1  8.9 - 10.3 mg/dL Final  . Total Protein  05/18/2017 7.2  6.5 - 8.1 g/dL Final  . Albumin 05/18/2017 4.1  3.5 - 5.0 g/dL Final  . AST 05/18/2017 13* 15 - 41 U/L Final  . ALT 05/18/2017 9* 14 - 54 U/L Final  . Alkaline Phosphatase 05/18/2017 94  38 - 126 U/L Final  . Total Bilirubin 05/18/2017 0.6  0.3 - 1.2 mg/dL Final  . GFR calc non Af Amer 05/18/2017 >60  >60 mL/min Final  . GFR calc Af Amer 05/18/2017 >60  >60 mL/min Final   Comment: (NOTE) The eGFR has been calculated using the CKD EPI equation. This calculation has not been validated in all clinical situations. eGFR's persistently <60 mL/min signify possible Chronic Kidney Disease.   . Anion gap 05/18/2017 9  5 - 15 Final  . Prothrombin Time 05/18/2017 13.2  11.4 - 15.2 seconds Final  . INR 05/18/2017 1.00   Final  . Color, Urine 05/18/2017 YELLOW  YELLOW Final  . APPearance 05/18/2017 CLEAR  CLEAR Final  .  Specific Gravity, Urine 05/18/2017 1.006  1.005 - 1.030 Final  . pH 05/18/2017 5.0  5.0 - 8.0 Final  . Glucose, UA 05/18/2017 NEGATIVE  NEGATIVE mg/dL Final  . Hgb urine dipstick 05/18/2017 SMALL* NEGATIVE Final  . Bilirubin Urine 05/18/2017 NEGATIVE  NEGATIVE Final  . Ketones, ur 05/18/2017 NEGATIVE  NEGATIVE mg/dL Final  . Protein, ur 05/18/2017 NEGATIVE  NEGATIVE mg/dL Final  . Nitrite 05/18/2017 POSITIVE* NEGATIVE Final  . Leukocytes, UA 05/18/2017 SMALL* NEGATIVE Final  . RBC / HPF 05/18/2017 0-5  0 - 5 RBC/hpf Final  . WBC, UA 05/18/2017 0-5  0 - 5 WBC/hpf Final  . Bacteria, UA 05/18/2017 MANY* NONE SEEN Final  . Squamous Epithelial / LPF 05/18/2017 0-5* NONE SEEN Final  . MRSA, PCR 05/18/2017 NEGATIVE  NEGATIVE Final  . Staphylococcus aureus 05/18/2017 NEGATIVE  NEGATIVE Final   Comment:        The Xpert SA Assay (FDA approved for NASAL specimens in patients over 84 years of age), is one component of a comprehensive surveillance program.  Test performance has been validated by [facility] for patients greater than or equal to 58 year old. It is not intended to diagnose infection nor to guide or monitor treatment.      X-Rays:Dg Chest 2 View  Result Date: 05/18/2017 CLINICAL DATA:  Preoperative lumbar surgery.  Hypertension. EXAM: CHEST  2 VIEW COMPARISON:  October 18, 2016 and May 17, 2015 chest radiograph; chest CT April 03, 2012 FINDINGS: There is stable scarring in the right apical region. There is no edema or consolidation. The heart size and pulmonary vascularity are normal. No adenopathy. There is aortic atherosclerosis. No bone lesions. IMPRESSION: Scarring right apex. No edema or consolidation. Stable cardiac silhouette. There is aortic atherosclerosis. Electronically Signed   By: Lowella Grip III M.D.   On: 05/18/2017 14:35   Dg Lumbar Spine 2-3 Views  Result Date: 05/18/2017 CLINICAL DATA:  Preop lumbar surgery EXAM: LUMBAR SPINE - 2-3 VIEW COMPARISON:   03/15/2017 FINDINGS: There is slight retrolisthesis L3 upon L4 which is stable. There is slight levoscoliosis at L3-4. Mild wedging of L1, L2, and L3 vertebral bodies is stable. There is no new compression deformity. Osteopenia. Mild facet arthropathy at L3-4, L4-5, and L5-S1. There is advanced disc space narrowing at L2-3 and L5-S1. Moderate disc narrowing occurs at L1-2 and L3-4. Minimal narrowing at the L4-5 disc. Atherosclerotic aortic calcifications. IMPRESSION: No acute bony pathology.  Chronic changes. Electronically Signed   By:  Jolaine Click M.D.   On: 05/18/2017 14:37   Dg Spine Portable 1 View  Result Date: 05/25/2017 CLINICAL DATA:  Surgery, elective. EXAM: PORTABLE SPINE - 1 VIEW COMPARISON:  Lumbar spine radiographs 05/18/2017 FINDINGS: A surgical clamp is at the L3 spinous process. Degenerative change and remote fractures are stable. IMPRESSION: Intraoperative localization of the L3 spinous process. Electronically Signed   By: Marin Roberts M.D.   On: 05/25/2017 15:47   Dg Spine Portable 1 View  Result Date: 05/25/2017 CLINICAL DATA:  Surgery, elective. EXAM: PORTABLE SPINE - 1 VIEW COMPARISON:  Intraoperative radiographs from earlier in the same day. Lumbar spine radiographs 05/18/2017. FINDINGS: The spinous process is labeled. Surgical probes are at the L3-4 level. IMPRESSION: Intraoperative localization of L3-4. Electronically Signed   By: Marin Roberts M.D.   On: 05/25/2017 15:46   Dg Spine Portable 1 View  Result Date: 05/25/2017 CLINICAL DATA:  Spinal surgery. EXAM: PORTABLE SPINE - 1 VIEW COMPARISON:  05/18/2017 FINDINGS: Single lateral view shows 2 needles in place. The lower needle is at the level of the superior spinous process of L4. The upper needle is at the level of the inferior spinous process of L3. IMPRESSION: L3 and L4 spinous processes localized. Electronically Signed   By: Paulina Fusi M.D.   On: 05/25/2017 15:15    EKG: Orders placed or performed  during the hospital encounter of 05/18/17  . EKG  . EKG     Hospital Course: [name] is a 71 y.o. who was admitted to [facility]. They were brought to the operating room on 05/25/2017 and underwent Procedure(s): CENTRAL DECOMPRESSIVE LUMBAR LAMINECTOMY FOR SPINAL STENOSIS L3-L4, FORAMINOTOMY FOR THE L4 ROOT AND L5 ROOT.  Patient tolerated the procedure well and was later transferred to the recovery room and then to the orthopaedic floor for postoperative care.  They were given PO and IV analgesics for pain control following their surgery.  They were given 24 hours of postoperative antibiotics of  Anti-infectives    Start     Dose/Rate Route Frequency Ordered Stop   05/25/17 2200  ceFAZolin (ANCEF) IVPB 1 g/50 mL premix  Status:  Discontinued     1 g 100 mL/hr over 30 Minutes Intravenous Every 8 hours 05/25/17 1739 05/26/17 1429   05/25/17 1511  polymyxin B 500,000 Units, bacitracin 50,000 Units in sodium chloride irrigation 0.9 % 500 mL irrigation  Status:  Discontinued       As needed 05/25/17 1527 05/25/17 1626   05/25/17 1230  ceFAZolin (ANCEF) IVPB 2g/100 mL premix     2 g 200 mL/hr over 30 Minutes Intravenous On call to O.R. 05/25/17 1226 05/25/17 1450     and started on DVT prophylaxis in the form of Aspirin.   PT was ordered to ambulate the patient. Discharge planning consulted to help with postop disposition and equipment needs.  Patient had a fair night on the evening of surgery.   Dressing was changed and the incision was clean and dry. The patient had progressed with therapy and meeting their goals.  Incision was healing well.  Patient was seen in rounds and was ready to go home.   Diet: Cardiac diet Activity:WBAT Follow-up:in 2 weeks Disposition - Home Discharged Condition: stable   Discharge Instructions    Call MD / Call 911    Complete by:  As directed    If you experience chest pain or shortness of breath, CALL 911 and be transported to the hospital  emergency room.  If you develope a fever above 101 F, pus (white drainage) or increased drainage or redness at the wound, or calf pain, call your surgeon's office.   Constipation Prevention    Complete by:  As directed    Drink plenty of fluids.  Prune juice may be helpful.  You may use a stool softener, such as Colace (over the counter) 100 mg twice a day.  Use MiraLax (over the counter) for constipation as needed.   Diet - low sodium heart healthy    Complete by:  As directed    Discharge instructions    Complete by:  As directed    For the first three days, remove your dressing, tape a piece of saran wrap over your incision Take your shower, then remove the saran wrap and put a clean dressing on After three days you can shower without the saran wrap.  No lifting or driving  Call Dr. Gladstone Lighter if any wound complications or temperature of 101 degrees F or over.  Call the office for an appointment to see Dr. Gladstone Lighter in two weeks: [phone] and ask for Dr. Charlestine Night nurse, Brunilda Payor.   Increase activity slowly as tolerated    Complete by:  As directed      Allergies as of 05/26/2017      Reactions   Ciprofloxacin Other (See Comments)   Caused pt to have delusional thoughts and hallucinations   Procardia [nifedipine] Swelling   Edema    Norvasc [amlodipine Besylate] Hives      Medication List    TAKE these medications   acetaminophen 500 MG tablet Commonly known as:  TYLENOL Take 1 tablet (500 mg total) by mouth 2 (two) times daily. Pain   aspirin EC 81 MG tablet Take 81 mg by mouth daily.   Co Q-10 100 MG Caps Take 100 mg by mouth daily.   DULoxetine 60 MG capsule Commonly known as:  CYMBALTA Take 1 capsule (60 mg total) by mouth daily.   folic acid 1 MG tablet Commonly known as:  FOLVITE Take 1 tablet (1 mg total) by mouth daily.   levothyroxine 112 MCG tablet Commonly known as:  SYNTHROID, LEVOTHROID Take 1 tablet (112 mcg total) by mouth daily.    methocarbamol 500 MG tablet Commonly known as:  ROBAXIN Take 1 tablet (500 mg total) by mouth every 6 (six) hours as needed for muscle spasms.   oxyCODONE-acetaminophen 5-325 MG tablet Commonly known as:  PERCOCET/ROXICET Take 1-2 tablets by mouth every 4 (four) hours as needed for moderate pain.   rosuvastatin 10 MG tablet Commonly known as:  CRESTOR Take 1 tablet (10 mg total) by mouth daily.   thiamine 100 MG tablet Commonly known as:  VITAMIN B-1 Take 1 tablet (100 mg total) by mouth daily.   valsartan 160 MG tablet Commonly known as:  DIOVAN Take 2 tablets (320 mg total) by mouth daily. What changed:  how much to take  when to take this      Follow-up Information    Latanya Maudlin, MD. Schedule an appointment as soon as possible for a visit in 2 week(s).   Specialty:  Orthopedic Surgery Contact information: [address] [place] [address] [phone]           Signed: Ardeen Jourdain, PA-C Orthopaedic Surgery 05/28/2017, 11:20 AM

## 2017-07-03 ENCOUNTER — Other Ambulatory Visit: Payer: Self-pay | Admitting: Family Medicine

## 2017-07-03 ENCOUNTER — Telehealth: Payer: Self-pay | Admitting: Family Medicine

## 2017-07-03 MED ORDER — OLMESARTAN MEDOXOMIL 40 MG PO TABS: 40.0000 mg | ORAL_TABLET | Freq: Every day | ORAL | 1 refills | Status: DC

## 2017-07-03 NOTE — Telephone Encounter (Signed)
Patient aware of medication change and that she will only be taking 1 daily

## 2017-07-03 NOTE — Telephone Encounter (Signed)
I sent in a substitute for her. Please note that in the past she was taking to valsartan, however with the new medicine 1 tablet a day is enough. Thanks, [employer]

## 2017-07-09 ENCOUNTER — Other Ambulatory Visit: Payer: Self-pay | Admitting: Family Medicine

## 2017-08-16 ENCOUNTER — Other Ambulatory Visit: Payer: Medicare HMO | Admitting: Family Medicine

## 2017-08-22 ENCOUNTER — Ambulatory Visit (INDEPENDENT_AMBULATORY_CARE_PROVIDER_SITE_OTHER): Payer: Medicare HMO | Admitting: Family Medicine

## 2017-08-22 ENCOUNTER — Encounter: Payer: Self-pay | Admitting: Family Medicine

## 2017-08-22 ENCOUNTER — Ambulatory Visit (INDEPENDENT_AMBULATORY_CARE_PROVIDER_SITE_OTHER): Payer: Medicare HMO

## 2017-08-22 VITALS — BP 163/100 | HR 101 | Temp 97.3°F | Ht 69.0 in | Wt 156.0 lb

## 2017-08-22 DIAGNOSIS — Z780 Asymptomatic menopausal state: Secondary | ICD-10-CM

## 2017-08-22 DIAGNOSIS — I10 Essential (primary) hypertension: Secondary | ICD-10-CM

## 2017-08-22 DIAGNOSIS — E559 Vitamin D deficiency, unspecified: Secondary | ICD-10-CM

## 2017-08-22 DIAGNOSIS — E038 Other specified hypothyroidism: Secondary | ICD-10-CM

## 2017-08-22 DIAGNOSIS — E782 Mixed hyperlipidemia: Secondary | ICD-10-CM

## 2017-08-22 MED ORDER — METHOCARBAMOL 500 MG PO TABS: 500.0000 mg | ORAL_TABLET | Freq: Four times a day (QID) | ORAL | 0 refills | Status: DC | PRN

## 2017-08-22 MED ORDER — LEVOTHYROXINE SODIUM 112 MCG PO TABS: 112.0000 ug | ORAL_TABLET | Freq: Every day | ORAL | 1 refills | Status: DC

## 2017-08-22 MED ORDER — DULOXETINE HCL 60 MG PO CPEP: 60.0000 mg | ORAL_CAPSULE | Freq: Every day | ORAL | 1 refills | Status: DC

## 2017-08-22 MED ORDER — OLMESARTAN MEDOXOMIL 40 MG PO TABS: 40.0000 mg | ORAL_TABLET | Freq: Every day | ORAL | 1 refills | Status: DC

## 2017-08-22 MED ORDER — ROSUVASTATIN CALCIUM 10 MG PO TABS: 10.0000 mg | ORAL_TABLET | Freq: Every day | ORAL | 3 refills | Status: DC

## 2017-08-22 NOTE — Progress Notes (Signed)
Subjective:  Patient ID: [name], female    DOB: [birth year]  Age: 71 y.o. MRN: [id number]  CC: Hypertension (pt here today for routine follow up of her chronic medical conditions. She sees a GYN [name] in [facility] for her pap smears and physicals.)   HPI [name] presents for Patient presents for follow-up on  thyroid. The patient has a history of hypothyroidism for many years. It has been stable recently. Pt. denies any change in  voice, loss of hair, heat or cold intolerance. Energy level has been adequate to good. Patient denies constipation and diarrhea. No myxedema. Medication is as noted below. Verified that pt is taking it daily on an empty stomach. Well tolerated. follow-up of hypertension. Patient has no history of headache chest pain or shortness of breath or recent cough. Patient also denies symptoms of TIA such as numbness weakness lateralizing. Patient checks  blood pressure at home and has not had any elevated readings recently. Patient denies side effects from his medication. States taking it regularly. Patient in for follow-up of elevated cholesterol. Doing well without complaints on current medication. Denies side effects of statin including myalgia and arthralgia and nausea. Also in today for liver function testing. 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 That has been improved by back surgery. Additionally she is concerned about left hip and knee pain. She sees ortho, [name] for that.  Depression screen [facility] 2/9 08/22/2017 02/13/2017 11/15/2016  Decreased Interest 0 0 0  Down, Depressed, Hopeless 0 0 0  PHQ - 2 Score 0 0 0    History [name] has a past medical history of Alcoholism ([place]) (01/09/2012); Anemia; Anxiety; B12 deficiency; Carotid artery narrowing (10/03/2012); Chronic kidney disease (CKD), stage II (mild); COPD (chronic obstructive pulmonary disease) ([place]);  Depression; Headache(784.0); Hyperlipidemia; Hypertension; Hyponatremia (01/09/2012); Hypothyroid; Menopause; Mini stroke ([place]); Orthostatic syncope (10/02/2012); Pneumonia; SBO (small bowel obstruction) ([place]) (06/28/2016); Shortness of breath; and Stroke ([place]) (2010).   She has a past surgical history that includes Appendectomy; Tonsillectomy; Cataract extraction w/PHACO (06/20/2012); Cataract extraction w/PHACO (07/11/2012); Colonoscopy (N/A, 12/17/2015); Esophagogastroduodenoscopy (N/A, 12/17/2015); Agile capsule (N/A, 02/22/2016); Knee arthroscopy (Right, 09/2016); and Lumbar laminectomy/decompression microdiscectomy (N/A, 05/25/2017).   Her family history includes GER disease in her mother; Hypertension in her maternal grandmother.She reports that she quit smoking about 25 years ago. Her smoking use included Cigarettes. She has a 60.00 pack-year smoking history. She has never used smokeless tobacco. She reports that she does not drink alcohol or use drugs.    ROS Review of Systems  Constitutional: Negative for activity change, appetite change and fever.  HENT: Negative for congestion, rhinorrhea and sore throat.   Eyes: Negative for visual disturbance.  Respiratory: Negative for cough and shortness of breath.   Cardiovascular: Negative for chest pain and palpitations.  Gastrointestinal: Negative for abdominal pain, diarrhea and nausea.  Endocrine: Negative for cold intolerance, polydipsia and polyuria.  Genitourinary: Negative for dysuria.  Musculoskeletal: Positive for arthralgias and back pain. Negative for myalgias.    Objective:  BP (!) 163/100   Pulse (!) 101   Temp (!) 97.3 F (36.3 C) (Oral)   Ht 5' 9"  (1.753 m)   Wt 156 lb (70.8 kg)   LMP 08/21/2013   BMI 23.04 kg/m   BP Readings from Last 3 Encounters:  08/22/17 (!) 163/100  05/26/17 (!) 164/78  05/18/17 (!) 144/77    Wt Readings from Last  3 Encounters:  08/22/17 156 lb (70.8 kg)  05/25/17 160 lb 8 oz (72.8 kg)  05/18/17  160 lb 8 oz (72.8 kg)     Physical Exam  Constitutional: She is oriented to person, place, and time. She appears well-developed and well-nourished. No distress.  HENT:  Head: Normocephalic and atraumatic.  Right Ear: External ear normal.  Left Ear: External ear normal.  Nose: Nose normal.  Mouth/Throat: Oropharynx is clear and moist.  Eyes: Pupils are equal, round, and reactive to light. Conjunctivae and EOM are normal.  Neck: Normal range of motion. Neck supple. No thyromegaly present.  Cardiovascular: Normal rate, regular rhythm and normal heart sounds.   No murmur heard. Pulmonary/Chest: Effort normal and breath sounds normal. No respiratory distress. She has no wheezes. She has no rales.  Abdominal: Soft. Bowel sounds are normal. She exhibits no distension. There is no tenderness.  Lymphadenopathy:    She has no cervical adenopathy.  Neurological: She is alert and oriented to person, place, and time. She has normal reflexes.  Skin: Skin is warm and dry.  Psychiatric: She has a normal mood and affect. Her behavior is normal. Judgment and thought content normal.      Assessment & Plan:   [name] was seen today for hypertension.  Diagnoses and all orders for this visit:  Essential hypertension -     CBC with Differential/Platelet -     CMP14+EGFR  Mixed hyperlipidemia -     Lipid panel  Other specified hypothyroidism -     TSH -     T4, Free  Vitamin D deficiency -     DG [facility] DEXA  Postmenopausal -     DG [facility] DEXA  Other orders -     DULoxetine (CYMBALTA) 60 MG capsule; Take 1 capsule (60 mg total) by mouth daily. -     levothyroxine (SYNTHROID, LEVOTHROID) 112 MCG tablet; Take 1 tablet (112 mcg total) by mouth daily. -     olmesartan (BENICAR) 40 MG tablet; Take 1 tablet (40 mg total) by mouth daily. -     rosuvastatin (CRESTOR) 10 MG tablet; Take 1 tablet (10 mg total) by mouth daily. -     methocarbamol (ROBAXIN) 500 MG tablet; Take 1 tablet (500 mg total)  by mouth every 6 (six) hours as needed for muscle spasms.       I have discontinued [name]'s valsartan and oxyCODONE-acetaminophen. I have also changed her levothyroxine. Additionally, I am having her maintain her folic acid, aspirin EC, thiamine, acetaminophen, Co Q-10, DULoxetine, olmesartan, rosuvastatin, and methocarbamol.  Allergies as of 08/22/2017      Reactions   Ciprofloxacin Other (See Comments)   Caused pt to have delusional thoughts and hallucinations   Procardia [nifedipine] Swelling   Edema    Norvasc [amlodipine Besylate] Hives      Medication List       Accurate as of 08/22/17 11:57 AM. Always use your most recent med list.          acetaminophen 500 MG tablet Commonly known as:  TYLENOL Take 1 tablet (500 mg total) by mouth 2 (two) times daily. Pain   aspirin EC 81 MG tablet Take 81 mg by mouth daily.   Co Q-10 100 MG Caps Take 100 mg by mouth daily.   DULoxetine 60 MG capsule Commonly known as:  CYMBALTA Take 1 capsule (60 mg total) by mouth daily.   folic acid 1 MG tablet Commonly known as:  FOLVITE Take 1  tablet (1 mg total) by mouth daily.   levothyroxine 112 MCG tablet Commonly known as:  SYNTHROID, LEVOTHROID Take 1 tablet (112 mcg total) by mouth daily.   methocarbamol 500 MG tablet Commonly known as:  ROBAXIN Take 1 tablet (500 mg total) by mouth every 6 (six) hours as needed for muscle spasms.   olmesartan 40 MG tablet Commonly known as:  BENICAR Take 1 tablet (40 mg total) by mouth daily.   rosuvastatin 10 MG tablet Commonly known as:  CRESTOR Take 1 tablet (10 mg total) by mouth daily.   thiamine 100 MG tablet Commonly known as:  VITAMIN B-1 Take 1 tablet (100 mg total) by mouth daily.            Discharge Care Instructions        Start     Ordered   08/22/17 0000  CBC with Differential/Platelet     08/22/17 1009   08/22/17 0000  CMP14+EGFR     08/22/17 1009   08/22/17 0000  Lipid panel     08/22/17 1009    08/22/17 0000  TSH     08/22/17 1009   08/22/17 0000  T4, Free     08/22/17 1009   08/22/17 0000  DULoxetine (CYMBALTA) 60 MG capsule  Daily     08/22/17 1010   08/22/17 0000  levothyroxine (SYNTHROID, LEVOTHROID) 112 MCG tablet  Daily     08/22/17 1010   08/22/17 0000  olmesartan (BENICAR) 40 MG tablet  Daily     08/22/17 1010   08/22/17 0000  rosuvastatin (CRESTOR) 10 MG tablet  Daily     08/22/17 1010   08/22/17 0000  methocarbamol (ROBAXIN) 500 MG tablet  Every 6 hours PRN     08/22/17 1010   08/22/17 0000  DG [facility] DEXA    Question:  Reason for Exam (SYMPTOM  OR DIAGNOSIS REQUIRED)  Answer:  postmenopausal   08/22/17 1013       Follow-up: Return in about 6 months (around 02/19/2018).  Claretta Fraise, M.D.

## 2017-08-23 LAB — LIPID PANEL
Chol/HDL Ratio: 2.4 ratio (ref 0.0–4.4)
Cholesterol, Total: 193 mg/dL (ref 100–199)
HDL: 80 mg/dL (ref >39)
LDL Calculated: 69 mg/dL (ref 0–99)
Triglycerides: 220 mg/dL — ABNORMAL HIGH (ref 0–149)
VLDL Cholesterol Cal: 44 mg/dL — ABNORMAL HIGH (ref 5–40)

## 2017-08-23 LAB — CBC WITH DIFFERENTIAL/PLATELET
Basophils Absolute: 0.1 10*3/uL (ref 0.0–0.2)
Basos: 1 %
EOS (ABSOLUTE): 0.2 10*3/uL (ref 0.0–0.4)
Eos: 2 %
Hematocrit: 40.6 % (ref 34.0–46.6)
Hemoglobin: 13.6 g/dL (ref 11.1–15.9)
Immature Grans (Abs): 0 10*3/uL (ref 0.0–0.1)
Immature Granulocytes: 0 %
Lymphocytes Absolute: 2.6 10*3/uL (ref 0.7–3.1)
Lymphs: 25 %
MCH: 32.1 pg (ref 26.6–33.0)
MCHC: 33.5 g/dL (ref 31.5–35.7)
MCV: 96 fL (ref 79–97)
Monocytes Absolute: 0.4 10*3/uL (ref 0.1–0.9)
Monocytes: 4 %
Neutrophils Absolute: 7 10*3/uL (ref 1.4–7.0)
Neutrophils: 68 %
Platelets: 309 10*3/uL (ref 150–379)
RBC: 4.24 x10E6/uL (ref 3.77–5.28)
RDW: 13.6 % (ref 12.3–15.4)
WBC: 10.3 10*3/uL (ref 3.4–10.8)

## 2017-08-23 LAB — CMP14+EGFR
ALT: 10 IU/L (ref 0–32)
AST: 16 IU/L (ref 0–40)
Albumin/Globulin Ratio: 1.7 (ref 1.2–2.2)
Albumin: 4.6 g/dL (ref 3.6–4.8)
Alkaline Phosphatase: 112 IU/L (ref 39–117)
BUN/Creatinine Ratio: 17 (ref 12–28)
BUN: 13 mg/dL (ref 8–27)
Bilirubin Total: 0.9 mg/dL (ref 0.0–1.2)
CO2: 19 mmol/L — ABNORMAL LOW (ref 20–29)
Calcium: 9.5 mg/dL (ref 8.7–10.3)
Chloride: 97 mmol/L (ref 96–106)
Creatinine, Ser: 0.77 mg/dL (ref 0.57–1.00)
GFR calc Af Amer: 91 mL/min/{1.73_m2} (ref >59)
GFR calc non Af Amer: 79 mL/min/{1.73_m2} (ref >59)
Globulin, Total: 2.7 g/dL (ref 1.5–4.5)
Glucose: 62 mg/dL — ABNORMAL LOW (ref 65–99)
Potassium: 4.1 mmol/L (ref 3.5–5.2)
Sodium: 140 mmol/L (ref 134–144)
Total Protein: 7.3 g/dL (ref 6.0–8.5)

## 2017-08-23 LAB — T4, FREE: Free T4: 2.05 ng/dL — ABNORMAL HIGH (ref 0.82–1.77)

## 2017-08-23 LAB — TSH: TSH: 0.821 u[IU]/mL (ref 0.450–4.500)

## 2017-08-24 ENCOUNTER — Other Ambulatory Visit: Payer: Self-pay | Admitting: *Deleted

## 2017-08-24 MED ORDER — ALENDRONATE SODIUM 70 MG PO TABS: 70.0000 mg | ORAL_TABLET | ORAL | 3 refills | Status: DC

## 2017-09-05 ENCOUNTER — Ambulatory Visit (INDEPENDENT_AMBULATORY_CARE_PROVIDER_SITE_OTHER): Payer: Medicare HMO

## 2017-09-05 DIAGNOSIS — Z23 Encounter for immunization: Secondary | ICD-10-CM | POA: Diagnosis not present

## 2017-09-18 ENCOUNTER — Encounter: Payer: Self-pay | Admitting: *Deleted

## 2017-09-18 ENCOUNTER — Ambulatory Visit (INDEPENDENT_AMBULATORY_CARE_PROVIDER_SITE_OTHER): Payer: Medicare HMO | Admitting: *Deleted

## 2017-09-18 VITALS — BP 153/92 | HR 91 | Ht 66.5 in | Wt 158.0 lb

## 2017-09-18 DIAGNOSIS — Z0000 Encounter for general adult medical examination without abnormal findings: Secondary | ICD-10-CM

## 2017-09-18 NOTE — Patient Instructions (Addendum)
  Ms. [name] , Thank you for taking time to come for your Medicare Wellness Visit. I appreciate your ongoing commitment to your health goals. Please review the following plan we discussed and let me know if I can assist you in the future.   These are the goals we discussed: Goals    . Exercise 3x per week (30 min per time)          Chair exercises for 15 minutes daily. Refer to handout that was given.        This is a list of the screening recommended for you and due dates:  Health Maintenance  Topic Date Due  . Mammogram  10/10/2017  . Tetanus Vaccine  12/13/2025  . Colon Cancer Screening  12/16/2025  . Flu Shot  Completed  . DEXA scan (bone density measurement)  Completed  .  Hepatitis C: One time screening is recommended by Center for Disease Control  (CDC) for  adults born from [birth year] through 1964.   Completed  . Pneumonia vaccines  Completed    Empty your bladder every 3 hours in order to avoid having to rush to the bathroom. Check your blood pressure at home daily and keep a log to bring to your PCP

## 2017-09-21 NOTE — Progress Notes (Signed)
Subjective:   [name] is a 71 y.o. female who presents for an Initial Medicare Annual Wellness Visit. [name] is retired and lives at home. Her sons girlfriend and their two children live with her. She enjoys reading and puzzles.   Review of Systems    Health is about the same as last year.   Cardiac Risk Factors include: advanced age (>93men, >79 women);dyslipidemia;hypertension;sedentary lifestyle  Other systems negative today.    Objective:    Today's Vitals   09/18/17 1043  BP: (!) 153/92  Pulse: 91  Weight: 158 lb (71.7 kg)  Height: 5' 6.5" (1.689 m)   Body mass index is 25.12 kg/m.   Current Medications (verified) Outpatient Encounter Prescriptions as of 09/18/2017  Medication Sig  . acetaminophen (TYLENOL) 500 MG tablet Take 1 tablet (500 mg total) by mouth 2 (two) times daily. Pain  . alendronate (FOSAMAX) 70 MG tablet Take 1 tablet (70 mg total) by mouth once a week. Take with a full glass of water on an empty stomach.  Marland Kitchen aspirin EC 81 MG tablet Take 81 mg by mouth daily.  . Coenzyme Q10 (CO Q-10) 100 MG CAPS Take 100 mg by mouth daily.  . DULoxetine (CYMBALTA) 60 MG capsule Take 1 capsule (60 mg total) by mouth daily.  . folic acid (FOLVITE) 784 MCG tablet Take 2,000 mcg by mouth daily.  Marland Kitchen levothyroxine (SYNTHROID, LEVOTHROID) 112 MCG tablet Take 1 tablet (112 mcg total) by mouth daily.  Marland Kitchen olmesartan (BENICAR) 40 MG tablet Take 1 tablet (40 mg total) by mouth daily.  . rosuvastatin (CRESTOR) 10 MG tablet Take 1 tablet (10 mg total) by mouth daily.  Marland Kitchen thiamine (VITAMIN B-1) 100 MG tablet Take 1 tablet (100 mg total) by mouth daily.  . [DISCONTINUED] folic acid (FOLVITE) 1 MG tablet Take 1 tablet (1 mg total) by mouth daily.  . [DISCONTINUED] methocarbamol (ROBAXIN) 500 MG tablet Take 1 tablet (500 mg total) by mouth every 6 (six) hours as needed for muscle spasms.   No facility-administered encounter medications on file as of 09/18/2017.     Allergies  (verified) Ciprofloxacin; Procardia [nifedipine]; and Norvasc [amlodipine besylate]   History: Past Medical History:  Diagnosis Date  . Alcoholism ([place]) 01/09/2012  . Anemia   . Anxiety   . B12 deficiency   . Carotid artery narrowing 10/03/2012   On the right.  . Chronic kidney disease (CKD), stage II (mild)   . COPD (chronic obstructive pulmonary disease) ([place])   . Depression   . Headache(784.0)   . Hyperlipidemia   . Hypertension   . Hyponatremia 01/09/2012  . Hypothyroid   . Menopause   . Mini stroke ([place])   . Orthostatic syncope 10/02/2012  . Pneumonia   . SBO (small bowel obstruction) ([place]) 06/28/2016  . Shortness of breath   . Stroke [facility]) 2010   no deficits   Past Surgical History:  Procedure Laterality Date  . AGILE CAPSULE N/A 02/22/2016   dummy capsule remained in the distal ileum  . APPENDECTOMY    . CATARACT EXTRACTION W/PHACO  06/20/2012   Procedure: CATARACT EXTRACTION PHACO AND INTRAOCULAR LENS PLACEMENT (IOC);  Surgeon: Tonny Branch, MD;  Location: [facility];  Service: Ophthalmology;  Laterality: Right;  CDE:10.66  . CATARACT EXTRACTION W/PHACO  07/11/2012   Procedure: CATARACT EXTRACTION PHACO AND INTRAOCULAR LENS PLACEMENT (IOC);  Surgeon: Tonny Branch, MD;  Location: [facility];  Service: Ophthalmology;  Laterality: Left;  CDE: 12.69  . COLONOSCOPY N/A 12/17/2015  RMR: Marked circumferential ulceration of the ascending colon/Ileocecal valve ulceration with biopsy most consistent with NSAID injury versus inflammatory bowel disease, pancolonoc diverticulosis redundant colon  . ESOPHAGOGASTRODUODENOSCOPY N/A 12/17/2015   IWP:[id number] gastric polyp, biopsy benign  . KNEE ARTHROSCOPY Right 09/2016  . LUMBAR LAMINECTOMY/DECOMPRESSION MICRODISCECTOMY N/A 05/25/2017   Procedure: CENTRAL DECOMPRESSIVE LUMBAR LAMINECTOMY FOR SPINAL STENOSIS L3-L4, FORAMINOTOMY FOR THE L4 ROOT AND L5 ROOT;  Surgeon: Latanya Maudlin, MD;  Location: [facility];  Service: Orthopedics;  Laterality: N/A;  .  TONSILLECTOMY     Family History  Problem Relation Age of Onset  . GER disease Mother   . Ulcers Mother   . Hypertension Maternal Grandmother    Social History   Occupational History  . Not on file.   Social History Main Topics  . Smoking status: Former Smoker    Packs/day: 2.00    Years: 30.00    Types: Cigarettes    Quit date: 06/17/1992  . Smokeless tobacco: Never Used  . Alcohol use No  . Drug use: No  . Sexual activity: No    Tobacco Counseling No tobacco use  Activities of Daily Living In your present state of health, do you have any difficulty performing the following activities: 09/18/2017 05/25/2017  Hearing? N N  Vision? N N  Difficulty concentrating or making decisions? Y N  Comment some problems with remembering names and names of products sometimes mild memory deficit  Walking or climbing stairs? Y Y  Comment Uses handrails. Has two steps onto her porch and she uses the rails -  Dressing or bathing? N N  Doing errands, shopping? N N  Preparing Food and eating ? N -  Using the Toilet? N -  In the past six months, have you accidently leaked urine? Y -  Comment some urge incontinence but not daily -  Do you have problems with loss of bowel control? N -  Managing your Medications? N -  Comment Sets pillbox up weekly -  Managing your Finances? N -  Housekeeping or managing your Housekeeping? Y -  Comment some problems due to balance -  Some recent data might be hidden    Immunizations and Health Maintenance Immunization History  Administered Date(s) Administered  . Influenza, High Dose Seasonal PF 09/23/2014, 09/11/2016, 09/05/2017  . Influenza,inj,Quad PF,6+ Mos 10/01/2013, 09/10/2015  . Pneumococcal Conjugate-13 05/12/2016  . Pneumococcal Polysaccharide-23 01/10/2012, 04/27/2015  . Tdap 12/14/2015   There are no preventive care reminders to display for this patient.  Patient Care Team: Claretta Fraise, MD as PCP - General (Family Medicine) Gala Romney  Cristopher Estimable, MD as Consulting Physician (Gastroenterology)  No hospitalizations, ER visits, or surgeries this past year.      Assessment:   This is a routine wellness examination for Ayeza.   Hearing/Vision screen No deficit noted during visit. Due for eye exa .   Dietary issues and exercise activities discussed: Current Exercise Habits: The patient does not participate in regular exercise at present, Exercise limited by: neurologic condition(s);orthopedic condition(s) (balance problems and left knee pain)  Diet: 3 meals a day and some snacks  Goals    . Exercise 3x per week (30 min per time)          Chair exercises for 15 minutes daily. Refer to handout that was given.       Depression Screen PHQ 2/9 Scores 09/18/2017 08/22/2017 02/13/2017 11/15/2016 10/18/2016 10/02/2016 08/15/2016  PHQ - 2 Score 0 0 0 0 0 0 0  Fall Risk Fall Risk  09/18/2017 08/22/2017 02/13/2017 11/15/2016 08/15/2016  Falls in the past year? Yes No No No No  Number falls in past yr: 2 or more - - - -  Injury with Fall? No - - - -  Risk Factor Category  High Fall Risk - - - -  Risk for fall due to : Impaired balance/gait - - - -  Risk for fall due to: Comment Uses a 1 point cane. Noticed worsening of balance since switching from Valsartan to olmesartan - - - -  Follow up Falls prevention discussed - - - -    Cognitive Function: MMSE - Mini Mental State Exam 09/18/2017  Orientation to time 4  Orientation to Place 5  Registration 3  Attention/ Calculation 5  Recall 2  Language- name 2 objects 2  Language- repeat 1  Language- follow 3 step command 3  Language- read & follow direction 1  Write a sentence 1  Copy design 0  Total score 27        Screening Tests Health Maintenance  Topic Date Due  . MAMMOGRAM  10/10/2017  . TETANUS/TDAP  12/13/2025  . COLONOSCOPY  12/16/2025  . INFLUENZA VACCINE  Completed  . DEXA SCAN  Completed  . Hepatitis C Screening  Completed  . PNA vac Low Risk Adult  Completed        Plan:  Chair exercises given and reviewed. Do those daily.  Urinate every 3 hours to help with urge and stress incontinence.  Keep f/u appts    I have personally reviewed and noted the following in the patient's chart:   . Medical and social history . Use of alcohol, tobacco or illicit drugs  . Current medications and supplements . Functional ability and status . Nutritional status . Physical activity . Advanced directives . List of other physicians . Hospitalizations, surgeries, and ER visits in previous 12 months . Vitals . Screenings to include cognitive, depression, and falls . Referrals and appointments  In addition, I have reviewed and discussed with patient certain preventive protocols, quality metrics, and best practice recommendations. A written personalized care plan for preventive services as well as general preventive health recommendations were provided to patient.    Chong Sicilian, RN  09/21/2017   I have reviewed and agree with the above AWV documentation.  Claretta Fraise, M.D.

## 2017-09-24 ENCOUNTER — Ambulatory Visit (INDEPENDENT_AMBULATORY_CARE_PROVIDER_SITE_OTHER): Payer: Medicare HMO | Admitting: Family Medicine

## 2017-09-24 ENCOUNTER — Encounter: Payer: Self-pay | Admitting: Family Medicine

## 2017-09-24 VITALS — BP 138/89 | HR 91 | Temp 98.5°F | Ht 66.5 in | Wt 160.0 lb

## 2017-09-24 DIAGNOSIS — N3090 Cystitis, unspecified without hematuria: Secondary | ICD-10-CM | POA: Diagnosis not present

## 2017-09-24 DIAGNOSIS — Z9189 Other specified personal risk factors, not elsewhere classified: Secondary | ICD-10-CM | POA: Diagnosis not present

## 2017-09-24 DIAGNOSIS — Z87898 Personal history of other specified conditions: Secondary | ICD-10-CM | POA: Diagnosis not present

## 2017-09-24 DIAGNOSIS — Z8742 Personal history of other diseases of the female genital tract: Secondary | ICD-10-CM

## 2017-09-24 DIAGNOSIS — R399 Unspecified symptoms and signs involving the genitourinary system: Secondary | ICD-10-CM | POA: Diagnosis not present

## 2017-09-24 LAB — URINALYSIS
Bilirubin, UA: NEGATIVE
Glucose, UA: NEGATIVE
Nitrite, UA: POSITIVE — AB
Protein, UA: NEGATIVE
Specific Gravity, UA: 1.025 (ref 1.005–1.030)
Urobilinogen, Ur: 4 mg/dL — ABNORMAL HIGH (ref 0.2–1.0)
pH, UA: 5.5 (ref 5.0–7.5)

## 2017-09-24 MED ORDER — SULFAMETHOXAZOLE-TRIMETHOPRIM 800-160 MG PO TABS: 1.0000 | ORAL_TABLET | Freq: Two times a day (BID) | ORAL | 0 refills | Status: DC

## 2017-09-24 NOTE — Addendum Note (Signed)
Addended by: Marylin Crosby on: 09/24/2017 10:33 AM   Modules accepted: Orders

## 2017-09-24 NOTE — Progress Notes (Signed)
Subjective:  Patient ID: [name], female    DOB: [birth year]  Age: 71 y.o. MRN: [id number]  CC: Gynecologic Exam GYN Exam and cystitis sx.burning with urination and frequency for several days. Denies fever . No flank pain. No nausea, vomiting.Reports abnormal Pap smear about 6 years ago. Gynecologist told her at the time he wanted to follow-up on it periodically. Since then he has retired. She is establishing here for her GYN exams.   Depression screen [facility] 2/9 09/24/2017 09/18/2017 08/22/2017  Decreased Interest 0 0 0  Down, Depressed, Hopeless 0 0 0  PHQ - 2 Score 0 0 0    History [name] has a past medical history of Alcoholism ([place]) (01/09/2012); Anemia; Anxiety; B12 deficiency; Carotid artery narrowing (10/03/2012); Chronic kidney disease (CKD), stage II (mild); COPD (chronic obstructive pulmonary disease) ([place]); Depression; Headache(784.0); Hyperlipidemia; Hypertension; Hyponatremia (01/09/2012); Hypothyroid; Menopause; Mini stroke ([place]); Orthostatic syncope (10/02/2012); Pneumonia; SBO (small bowel obstruction) ([place]) (06/28/2016); Shortness of breath; and Stroke ([place]) (2010).   She has a past surgical history that includes Appendectomy; Tonsillectomy; Cataract extraction w/PHACO (06/20/2012); Cataract extraction w/PHACO (07/11/2012); Colonoscopy (N/A, 12/17/2015); Esophagogastroduodenoscopy (N/A, 12/17/2015); Agile capsule (N/A, 02/22/2016); Knee arthroscopy (Right, 09/2016); and Lumbar laminectomy/decompression microdiscectomy (N/A, 05/25/2017).   Her family history includes GER disease in her mother; Hypertension in her maternal grandmother; Ulcers in her mother.She reports that she quit smoking about 25 years ago. Her smoking use included Cigarettes. She has a 60.00 pack-year smoking history. She has never used smokeless tobacco. She reports that she does not drink alcohol or use drugs.    ROS Review of Systems  Constitutional: Negative for activity change, appetite change and fever.    HENT: Negative for congestion, rhinorrhea and sore throat.   Eyes: Negative for visual disturbance.  Respiratory: Negative for cough and shortness of breath.   Cardiovascular: Negative for chest pain and palpitations.  Gastrointestinal: Negative for abdominal pain, diarrhea and nausea.  Genitourinary: Negative for dysuria.  Musculoskeletal: Negative for arthralgias and myalgias.    Objective:  BP 138/89   Pulse 91   Temp 98.5 F (36.9 C) (Oral)   Ht 5' 6.5" (1.689 m)   Wt 160 lb (72.6 kg)   LMP 08/21/2013   BMI 25.44 kg/m   BP Readings from Last 3 Encounters:  09/24/17 138/89  09/18/17 (!) 153/92  08/22/17 (!) 163/100    Wt Readings from Last 3 Encounters:  09/24/17 160 lb (72.6 kg)  09/18/17 158 lb (71.7 kg)  08/22/17 156 lb (70.8 kg)     Physical Exam  Constitutional: She is oriented to person, place, and time. She appears well-developed and well-nourished. No distress.  HENT:  Head: Normocephalic and atraumatic.  Eyes: Conjunctivae and EOM are normal. Right eye exhibits no discharge. Left eye exhibits no discharge. No scleral icterus.  Neck: Normal range of motion. Neck supple. No thyromegaly present.  Cardiovascular: Normal rate, regular rhythm and normal heart sounds.   No murmur heard. Pulmonary/Chest: Effort normal and breath sounds normal. No respiratory distress. She has no wheezes. She has no rales. She exhibits no mass and no tenderness. Right breast exhibits no inverted nipple, no mass, no nipple discharge, no skin change and no tenderness. Left breast exhibits no inverted nipple, no mass, no nipple discharge, no skin change and no tenderness. Breasts are symmetrical.  Abdominal: Soft. Bowel sounds are normal. She exhibits no distension. There is no tenderness.  Genitourinary: Vagina normal. No vaginal discharge found.  Musculoskeletal: Normal range of motion.  Neurological:  She is alert and oriented to person, place, and time.  Skin: Skin is warm and dry.   Psychiatric: She has a normal mood and affect. Her behavior is normal. Judgment and thought content normal.      Assessment & Plan:   [name] was seen today for gynecologic exam.  Diagnoses and all orders for this visit:  GYN exam for high-risk Medicare patient  Cystitis -     Urine Culture -     Urinalysis  Other orders -     sulfamethoxazole-trimethoprim (BACTRIM DS,SEPTRA DS) 800-160 MG tablet; Take 1 tablet by mouth 2 (two) times daily.       I am having [name] start on sulfamethoxazole-trimethoprim. I am also having her maintain her aspirin EC, thiamine, acetaminophen, Co Q-10, DULoxetine, levothyroxine, olmesartan, rosuvastatin, alendronate, and folic acid.  Allergies as of 09/24/2017      Reactions   Ciprofloxacin Other (See Comments)   Caused pt to have delusional thoughts and hallucinations   Procardia [nifedipine] Swelling   Edema    Norvasc [amlodipine Besylate] Hives      Medication List       Accurate as of 09/24/17  9:59 AM. Always use your most recent med list.          acetaminophen 500 MG tablet Commonly known as:  TYLENOL Take 1 tablet (500 mg total) by mouth 2 (two) times daily. Pain   alendronate 70 MG tablet Commonly known as:  FOSAMAX Take 1 tablet (70 mg total) by mouth once a week. Take with a full glass of water on an empty stomach.   aspirin EC 81 MG tablet Take 81 mg by mouth daily.   Co Q-10 100 MG Caps Take 100 mg by mouth daily.   DULoxetine 60 MG capsule Commonly known as:  CYMBALTA Take 1 capsule (60 mg total) by mouth daily.   folic acid 673 MCG tablet Commonly known as:  FOLVITE Take 2,000 mcg by mouth daily.   levothyroxine 112 MCG tablet Commonly known as:  SYNTHROID, LEVOTHROID Take 1 tablet (112 mcg total) by mouth daily.   olmesartan 40 MG tablet Commonly known as:  BENICAR Take 1 tablet (40 mg total) by mouth daily.   rosuvastatin 10 MG tablet Commonly known as:  CRESTOR Take 1 tablet (10 mg total)  by mouth daily.   sulfamethoxazole-trimethoprim 800-160 MG tablet Commonly known as:  BACTRIM DS,SEPTRA DS Take 1 tablet by mouth 2 (two) times daily.   thiamine 100 MG tablet Commonly known as:  VITAMIN B-1 Take 1 tablet (100 mg total) by mouth daily.        Follow-up: Return in about 6 months (around 03/25/2018).  Claretta Fraise, M.D.

## 2017-09-25 LAB — PAP IG W/ RFLX HPV ASCU: PAP Smear Comment: 0

## 2017-09-25 NOTE — Progress Notes (Signed)
Your recent Pap smear performed in the office came back normal. You do not need further pap screening based on current guidelines!!!

## 2017-09-27 LAB — URINE CULTURE

## 2017-10-03 ENCOUNTER — Telehealth: Payer: Self-pay

## 2017-10-03 ENCOUNTER — Other Ambulatory Visit: Payer: Self-pay | Admitting: Family Medicine

## 2017-10-03 MED ORDER — METRONIDAZOLE 500 MG PO TABS: 500.0000 mg | ORAL_TABLET | Freq: Two times a day (BID) | ORAL | 0 refills | Status: DC

## 2017-10-03 MED ORDER — FLUCONAZOLE 150 MG PO TABS: 150.0000 mg | ORAL_TABLET | Freq: Once | ORAL | 0 refills | Status: AC

## 2017-10-03 NOTE — Telephone Encounter (Signed)
Patient states she finished her batrim and is still having vaginal discharge. Patient states that her dysuria cleared up with the rx but not the discharge. Please advise

## 2017-10-03 NOTE — Telephone Encounter (Signed)
Patient aware.

## 2017-10-03 NOTE — Telephone Encounter (Signed)
I sent in the requested prescription 

## 2017-11-08 ENCOUNTER — Other Ambulatory Visit: Payer: Self-pay | Admitting: *Deleted

## 2017-11-08 MED ORDER — ALENDRONATE SODIUM 70 MG PO TABS: 70.0000 mg | ORAL_TABLET | ORAL | 1 refills | Status: DC

## 2017-11-08 MED ORDER — ROSUVASTATIN CALCIUM 10 MG PO TABS: 10.0000 mg | ORAL_TABLET | Freq: Every day | ORAL | 1 refills | Status: DC

## 2017-11-13 ENCOUNTER — Other Ambulatory Visit: Payer: Self-pay | Admitting: *Deleted

## 2017-11-13 MED ORDER — OLMESARTAN MEDOXOMIL 40 MG PO TABS: 40.0000 mg | ORAL_TABLET | Freq: Every day | ORAL | 1 refills | Status: DC

## 2017-11-13 MED ORDER — LEVOTHYROXINE SODIUM 112 MCG PO TABS: 112.0000 ug | ORAL_TABLET | Freq: Every day | ORAL | 1 refills | Status: DC

## 2017-11-13 MED ORDER — DULOXETINE HCL 60 MG PO CPEP: 60.0000 mg | ORAL_CAPSULE | Freq: Every day | ORAL | 1 refills | Status: DC

## 2018-02-19 ENCOUNTER — Encounter: Payer: Self-pay | Admitting: Family Medicine

## 2018-02-19 ENCOUNTER — Ambulatory Visit: Payer: Medicare HMO | Admitting: Family Medicine

## 2018-02-19 VITALS — BP 138/81 | HR 93 | Temp 97.4°F | Ht 66.5 in | Wt 167.2 lb

## 2018-02-19 DIAGNOSIS — E782 Mixed hyperlipidemia: Secondary | ICD-10-CM | POA: Diagnosis not present

## 2018-02-19 DIAGNOSIS — M48062 Spinal stenosis, lumbar region with neurogenic claudication: Secondary | ICD-10-CM | POA: Diagnosis not present

## 2018-02-19 DIAGNOSIS — N182 Chronic kidney disease, stage 2 (mild): Secondary | ICD-10-CM | POA: Diagnosis not present

## 2018-02-19 DIAGNOSIS — E538 Deficiency of other specified B group vitamins: Secondary | ICD-10-CM | POA: Diagnosis not present

## 2018-02-19 DIAGNOSIS — I10 Essential (primary) hypertension: Secondary | ICD-10-CM

## 2018-02-19 DIAGNOSIS — E038 Other specified hypothyroidism: Secondary | ICD-10-CM

## 2018-02-19 MED ORDER — OLMESARTAN MEDOXOMIL 40 MG PO TABS: 40.0000 mg | ORAL_TABLET | Freq: Every day | ORAL | 1 refills | Status: DC

## 2018-02-19 MED ORDER — ROSUVASTATIN CALCIUM 10 MG PO TABS: 10.0000 mg | ORAL_TABLET | Freq: Every day | ORAL | 1 refills | Status: DC

## 2018-02-19 MED ORDER — ALENDRONATE SODIUM 70 MG PO TABS: 70.0000 mg | ORAL_TABLET | ORAL | 1 refills | Status: DC

## 2018-02-19 MED ORDER — DULOXETINE HCL 60 MG PO CPEP: 60.0000 mg | ORAL_CAPSULE | Freq: Every day | ORAL | 1 refills | Status: DC

## 2018-02-19 MED ORDER — LEVOTHYROXINE SODIUM 112 MCG PO TABS: 112.0000 ug | ORAL_TABLET | Freq: Every day | ORAL | 1 refills | Status: DC

## 2018-02-19 NOTE — Progress Notes (Signed)
Subjective:  Patient ID: [name], female    DOB: [birth year]  Age: 72 y.o. MRN: [id number]  CC: Hypertension (pt here today for routine follow up of her chronic medical conditions)   HPI [name] presents for  follow-up of hypertension. Patient has no history of headache chest pain or shortness of breath or recent cough. Patient also denies symptoms of TIA such as focal numbness or weakness. Patient checks  blood pressure at home. Readings recently nml. Patient denies side effects from medication. States taking it regularly.  Patient presents for follow-up on  thyroid. The patient has a history of hypothyroidism for many years. It has been stable recently. Pt. denies any change in  voice, loss of hair, heat or cold intolerance. Energy level has been adequate to good. Patient denies constipation and diarrhea. No myxedema. Medication is as noted below. Verified that pt is taking it daily on an empty stomach. Well tolerated.  Patient in for follow-up of elevated cholesterol. Doing well without complaints on current medication. Denies side effects of statin including myalgia and arthralgia and nausea. Also in today for liver function testing. Currently no chest pain, shortness of breath or other cardiovascular related symptoms noted.  Patient taking oral B12 for her vitamin B12 deficiency.  Drinks adequate water due to mild stage II chronic kidney disease.  She does have pain in the lumbar spine currently manageable.  She is looking at having surgery on the left knee for replacement soon due to arthritis. History [name] has a past medical history of Alcoholism ([place]) (01/09/2012), Anemia, Anxiety, B12 deficiency, Carotid artery narrowing (10/03/2012), Chronic kidney disease (CKD), stage II (mild), COPD (chronic obstructive pulmonary disease) ([place]), Depression, Headache(784.0), Hyperlipidemia, Hypertension, Hyponatremia (01/09/2012), Hypothyroid, Menopause, Mini stroke ([place]), Orthostatic  syncope (10/02/2012), Pneumonia, SBO (small bowel obstruction) ([place]) (06/28/2016), Shortness of breath, and Stroke ([place]) (2010).   She has a past surgical history that includes Appendectomy; Tonsillectomy; Cataract extraction w/PHACO (06/20/2012); Cataract extraction w/PHACO (07/11/2012); Colonoscopy (N/A, 12/17/2015); Esophagogastroduodenoscopy (N/A, 12/17/2015); Agile capsule (N/A, 02/22/2016); Knee arthroscopy (Right, 09/2016); and Lumbar laminectomy/decompression microdiscectomy (N/A, 05/25/2017).   Her family history includes GER disease in her mother; Hypertension in her maternal grandmother; Ulcers in her mother.She reports that she quit smoking about 25 years ago. Her smoking use included cigarettes. She has a 60.00 pack-year smoking history. she has never used smokeless tobacco. She reports that she does not drink alcohol or use drugs.  Current Outpatient Medications on File Prior to Visit  Medication Sig Dispense Refill  . acetaminophen (TYLENOL) 500 MG tablet Take 1 tablet (500 mg total) by mouth 2 (two) times daily. Pain    . aspirin EC 81 MG tablet Take 81 mg by mouth daily.    . Coenzyme Q10 (CO Q-10) 100 MG CAPS Take 100 mg by mouth daily. 212 each 10  . folic acid (FOLVITE) 248 MCG tablet Take 2,000 mcg by mouth daily.    [place] thiamine (VITAMIN B-1) 100 MG tablet Take 1 tablet (100 mg total) by mouth daily. 90 tablet 3   No current facility-administered medications on file prior to visit.     ROS Review of Systems  Constitutional: Negative for activity change, appetite change and fever.  HENT: Positive for ear pain. Negative for congestion, rhinorrhea and sore throat.   Eyes: Negative for visual disturbance.  Respiratory: Negative for cough and shortness of breath.   Cardiovascular: Negative for chest pain and palpitations.  Gastrointestinal: Negative for abdominal pain, diarrhea and nausea.  Genitourinary: Negative for dysuria.  Musculoskeletal: Positive for arthralgias and back pain.  Negative for myalgias.    Objective:  BP 138/81   Pulse 93   Temp (!) 97.4 F (36.3 C) (Oral)   Ht 5' 6.5" (1.689 m)   Wt 167 lb 4 oz (75.9 kg)   LMP 08/21/2013   BMI 26.59 kg/m   BP Readings from Last 3 Encounters:  02/19/18 138/81  09/24/17 138/89  09/18/17 (!) 153/92    Wt Readings from Last 3 Encounters:  02/19/18 167 lb 4 oz (75.9 kg)  09/24/17 160 lb (72.6 kg)  09/18/17 158 lb (71.7 kg)     Physical Exam  Constitutional: She is oriented to person, place, and time. She appears well-developed and well-nourished. No distress.  HENT:  Head: Normocephalic and atraumatic.  Right Ear: External ear normal.  Left Ear: External ear normal.  Nose: Nose normal.  Mouth/Throat: Oropharynx is clear and moist.  Eyes: Conjunctivae and EOM are normal. Pupils are equal, round, and reactive to light.  Neck: Normal range of motion. Neck supple. No thyromegaly present.  Cardiovascular: Normal rate, regular rhythm and normal heart sounds.  No murmur heard. Pulmonary/Chest: Effort normal and breath sounds normal. No respiratory distress. She has no wheezes. She has no rales.  Abdominal: Soft. Bowel sounds are normal. She exhibits no distension. There is no tenderness.  Lymphadenopathy:    She has no cervical adenopathy.  Neurological: She is alert and oriented to person, place, and time. She has normal reflexes.  Skin: Skin is warm and dry.  Psychiatric: She has a normal mood and affect. Her behavior is normal. Judgment and thought content normal.      Assessment & Plan:   Lynnsie was seen today for hypertension.  Diagnoses and all orders for this visit:  Essential hypertension -     CBC with Differential/Platelet -     CMP14+EGFR  Other specified hypothyroidism -     TSH -     T4, Free  Mixed hyperlipidemia -     Lipid panel  B12 deficiency -     Vitamin B12  Chronic kidney disease (CKD), stage II (mild)  Spinal stenosis, lumbar region with neurogenic  claudication  Other orders -     alendronate (FOSAMAX) 70 MG tablet; Take 1 tablet (70 mg total) by mouth once a week. Take with a full glass of water on an empty stomach. -     DULoxetine (CYMBALTA) 60 MG capsule; Take 1 capsule (60 mg total) by mouth daily. -     levothyroxine (SYNTHROID, LEVOTHROID) 112 MCG tablet; Take 1 tablet (112 mcg total) by mouth daily. -     olmesartan (BENICAR) 40 MG tablet; Take 1 tablet (40 mg total) by mouth daily. -     rosuvastatin (CRESTOR) 10 MG tablet; Take 1 tablet (10 mg total) by mouth daily.   Allergies as of 02/19/2018      Reactions   Ciprofloxacin Other (See Comments)   Caused pt to have delusional thoughts and hallucinations   Procardia [nifedipine] Swelling   Edema    Norvasc [amlodipine Besylate] Hives      Medication List        Accurate as of 02/19/18 12:01 PM. Always use your most recent med list.          acetaminophen 500 MG tablet Commonly known as:  TYLENOL Take 1 tablet (500 mg total) by mouth 2 (two) times daily. Pain   alendronate 70 MG tablet Commonly  known as:  FOSAMAX Take 1 tablet (70 mg total) by mouth once a week. Take with a full glass of water on an empty stomach.   aspirin EC 81 MG tablet Take 81 mg by mouth daily.   Co Q-10 100 MG Caps Take 100 mg by mouth daily.   DULoxetine 60 MG capsule Commonly known as:  CYMBALTA Take 1 capsule (60 mg total) by mouth daily.   folic acid 884 MCG tablet Commonly known as:  FOLVITE Take 2,000 mcg by mouth daily.   levothyroxine 112 MCG tablet Commonly known as:  SYNTHROID, LEVOTHROID Take 1 tablet (112 mcg total) by mouth daily.   olmesartan 40 MG tablet Commonly known as:  BENICAR Take 1 tablet (40 mg total) by mouth daily.   rosuvastatin 10 MG tablet Commonly known as:  CRESTOR Take 1 tablet (10 mg total) by mouth daily.   thiamine 100 MG tablet Commonly known as:  VITAMIN B-1 Take 1 tablet (100 mg total) by mouth daily.       Meds ordered this  encounter  Medications  . alendronate (FOSAMAX) 70 MG tablet    Sig: Take 1 tablet (70 mg total) by mouth once a week. Take with a full glass of water on an empty stomach.    Dispense:  12 tablet    Refill:  1  . DULoxetine (CYMBALTA) 60 MG capsule    Sig: Take 1 capsule (60 mg total) by mouth daily.    Dispense:  90 capsule    Refill:  1  . levothyroxine (SYNTHROID, LEVOTHROID) 112 MCG tablet    Sig: Take 1 tablet (112 mcg total) by mouth daily.    Dispense:  90 tablet    Refill:  1  . olmesartan (BENICAR) 40 MG tablet    Sig: Take 1 tablet (40 mg total) by mouth daily.    Dispense:  90 tablet    Refill:  1  . rosuvastatin (CRESTOR) 10 MG tablet    Sig: Take 1 tablet (10 mg total) by mouth daily.    Dispense:  90 tablet    Refill:  1      Follow-up: Return in about 6 months (around 08/22/2018).  Claretta Fraise, M.D.

## 2018-02-20 LAB — T4, FREE: Free T4: 1.4 ng/dL (ref 0.82–1.77)

## 2018-02-20 LAB — CMP14+EGFR
ALT: 9 IU/L (ref 0–32)
AST: 11 IU/L (ref 0–40)
Albumin/Globulin Ratio: 1.8 (ref 1.2–2.2)
Albumin: 4.7 g/dL (ref 3.5–4.8)
Alkaline Phosphatase: 84 IU/L (ref 39–117)
BUN/Creatinine Ratio: 21 (ref 12–28)
BUN: 22 mg/dL (ref 8–27)
Bilirubin Total: 0.4 mg/dL (ref 0.0–1.2)
CO2: 22 mmol/L (ref 20–29)
Calcium: 9.4 mg/dL (ref 8.7–10.3)
Chloride: 101 mmol/L (ref 96–106)
Creatinine, Ser: 1.03 mg/dL — ABNORMAL HIGH (ref 0.57–1.00)
GFR calc Af Amer: 64 mL/min/{1.73_m2} (ref >59)
GFR calc non Af Amer: 55 mL/min/{1.73_m2} — ABNORMAL LOW (ref >59)
Globulin, Total: 2.6 g/dL (ref 1.5–4.5)
Glucose: 99 mg/dL (ref 65–99)
Potassium: 4.3 mmol/L (ref 3.5–5.2)
Sodium: 140 mmol/L (ref 134–144)
Total Protein: 7.3 g/dL (ref 6.0–8.5)

## 2018-02-20 LAB — LIPID PANEL
Chol/HDL Ratio: 2.3 ratio (ref 0.0–4.4)
Cholesterol, Total: 147 mg/dL (ref 100–199)
HDL: 65 mg/dL (ref >39)
LDL Calculated: 54 mg/dL (ref 0–99)
Triglycerides: 142 mg/dL (ref 0–149)
VLDL Cholesterol Cal: 28 mg/dL (ref 5–40)

## 2018-02-20 LAB — CBC WITH DIFFERENTIAL/PLATELET
Basophils Absolute: 0 10*3/uL (ref 0.0–0.2)
Basos: 0 %
EOS (ABSOLUTE): 0.1 10*3/uL (ref 0.0–0.4)
Eos: 1 %
Hematocrit: 36.6 % (ref 34.0–46.6)
Hemoglobin: 12.2 g/dL (ref 11.1–15.9)
Immature Grans (Abs): 0 10*3/uL (ref 0.0–0.1)
Immature Granulocytes: 0 %
Lymphocytes Absolute: 1.6 10*3/uL (ref 0.7–3.1)
Lymphs: 25 %
MCH: 31.3 pg (ref 26.6–33.0)
MCHC: 33.3 g/dL (ref 31.5–35.7)
MCV: 94 fL (ref 79–97)
Monocytes Absolute: 0.4 10*3/uL (ref 0.1–0.9)
Monocytes: 6 %
Neutrophils Absolute: 4.2 10*3/uL (ref 1.4–7.0)
Neutrophils: 68 %
Platelets: 221 10*3/uL (ref 150–379)
RBC: 3.9 x10E6/uL (ref 3.77–5.28)
RDW: 13.4 % (ref 12.3–15.4)
WBC: 6.4 10*3/uL (ref 3.4–10.8)

## 2018-02-20 LAB — VITAMIN B12: Vitamin B-12: 206 pg/mL — ABNORMAL LOW (ref 232–1245)

## 2018-02-20 LAB — TSH: TSH: 15.47 u[IU]/mL — ABNORMAL HIGH (ref 0.450–4.500)

## 2018-02-23 ENCOUNTER — Other Ambulatory Visit: Payer: Self-pay | Admitting: Family Medicine

## 2018-02-23 LAB — T3, FREE: T3, Free: 2.1 pg/mL (ref 2.0–4.4)

## 2018-02-23 LAB — SPECIMEN STATUS REPORT

## 2018-02-23 MED ORDER — LIOTHYRONINE SODIUM 5 MCG PO TABS: 5.0000 ug | ORAL_TABLET | Freq: Every day | ORAL | 2 refills | Status: DC

## 2018-05-07 ENCOUNTER — Ambulatory Visit: Payer: Medicare HMO | Admitting: Family Medicine

## 2018-05-07 ENCOUNTER — Encounter: Payer: Self-pay | Admitting: Family Medicine

## 2018-05-07 VITALS — BP 154/87 | HR 96 | Ht 66.5 in | Wt 169.0 lb

## 2018-05-07 DIAGNOSIS — E038 Other specified hypothyroidism: Secondary | ICD-10-CM | POA: Diagnosis not present

## 2018-05-07 NOTE — Progress Notes (Signed)
Subjective:  Patient ID: [name], female    DOB: Apr 29, 1946  Age: 72 y.o. MRN: [id number]  CC: Thyroid Problem (pt here today following up on her thyroid)   HPI [name] presents for patient decided against increasing the dose of her thyroid medicine last time opting instead for observation.  At this time she is feeling fatigued and sluggish not getting any exercise.  She blames the arthritis in her knee and pain from that however for the arthritis.  She is having significant arthritic pain.  She has considered knee replacement but she has a couple of friends who have had it recently and one had to go back for second surgery the other one has not seen improvement yet.  However both have been in the last few months and are still convalescing.  She notes that her weight has gone up 10 pounds in the last several months.  She had an elevated TSH a couple months ago when she was here and opted to go with her same dose of medicine and recheck today instead of increasing the dose.  Depression screen [name] 2/9 05/07/2018 02/19/2018 09/24/2017  Decreased Interest 0 0 0  Down, Depressed, Hopeless 0 0 0  PHQ - 2 Score 0 0 0    History [name] has a past medical history of Alcoholism ([place]) (01/09/2012), Anemia, Anxiety, B12 deficiency, Carotid artery narrowing (10/03/2012), Chronic kidney disease (CKD), stage II (mild), COPD (chronic obstructive pulmonary disease) ([place]), Depression, Headache(784.0), Hyperlipidemia, Hypertension, Hyponatremia (01/09/2012), Hypothyroid, Menopause, Mini stroke ([place]), Orthostatic syncope (10/02/2012), Pneumonia, SBO (small bowel obstruction) ([place]) (06/28/2016), Shortness of breath, and Stroke ([place]) (2010).   She has a past surgical history that includes Appendectomy; Tonsillectomy; Cataract extraction w/PHACO (06/20/2012); Cataract extraction w/PHACO (07/11/2012); Colonoscopy (N/A, 12/17/2015); Esophagogastroduodenoscopy (N/A, 12/17/2015); Agile capsule (N/A, 02/22/2016); Knee  arthroscopy (Right, 09/2016); and Lumbar laminectomy/decompression microdiscectomy (N/A, 05/25/2017).   Her family history includes GER disease in her mother; Hypertension in her maternal grandmother; Ulcers in her mother.She reports that she quit smoking about 25 years ago. Her smoking use included cigarettes. She has a 60.00 pack-year smoking history. She has never used smokeless tobacco. She reports that she does not drink alcohol or use drugs.    ROS Review of Systems  Constitutional: Positive for fatigue.  HENT: Negative for congestion.   Eyes: Negative for visual disturbance.  Respiratory: Negative for shortness of breath.   Cardiovascular: Negative for chest pain.  Gastrointestinal: Negative for abdominal pain, constipation, diarrhea, nausea and vomiting.  Genitourinary: Negative for difficulty urinating.  Musculoskeletal: Positive for arthralgias and myalgias.  Neurological: Negative for headaches.  Psychiatric/Behavioral: Negative for sleep disturbance.    Objective:  BP (!) 154/87   Pulse 96   Ht 5' 6.5" (1.689 m)   Wt 169 lb (76.7 kg)   LMP 08/21/2013   BMI 26.87 kg/m   BP Readings from Last 3 Encounters:  05/07/18 (!) 154/87  02/19/18 138/81  09/24/17 138/89    Wt Readings from Last 3 Encounters:  05/07/18 169 lb (76.7 kg)  02/19/18 167 lb 4 oz (75.9 kg)  09/24/17 160 lb (72.6 kg)     Physical Exam  Constitutional: She is oriented to person, place, and time. She appears well-developed and well-nourished. No distress.  Neck: No thyromegaly present.  Cardiovascular: Normal rate and regular rhythm.  Pulmonary/Chest: Breath sounds normal.  Neurological: She is alert and oriented to person, place, and time.  Skin: Skin is warm and dry.  Psychiatric: She has a  normal mood and affect.      Assessment & Plan:   [name] was seen today for thyroid problem.  Diagnoses and all orders for this visit:  Other specified hypothyroidism -     TSH -     T4,  Free -     CMP14+EGFR       I am having [name]. Price maintain her aspirin EC, thiamine, acetaminophen, Co P-97, folic acid, alendronate, DULoxetine, levothyroxine, olmesartan, rosuvastatin, and liothyronine.  Allergies as of 05/07/2018      Reactions   Ciprofloxacin Other (See Comments)   Caused pt to have delusional thoughts and hallucinations   Procardia [nifedipine] Swelling   Edema    Norvasc [amlodipine Besylate] Hives      Medication List        Accurate as of 05/07/18 11:44 AM. Always use your most recent med list.          acetaminophen 500 MG tablet Commonly known as:  TYLENOL Take 1 tablet (500 mg total) by mouth 2 (two) times daily. Pain   alendronate 70 MG tablet Commonly known as:  FOSAMAX Take 1 tablet (70 mg total) by mouth once a week. Take with a full glass of water on an empty stomach.   aspirin EC 81 MG tablet Take 81 mg by mouth daily.   Co Q-10 100 MG Caps Take 100 mg by mouth daily.   DULoxetine 60 MG capsule Commonly known as:  CYMBALTA Take 1 capsule (60 mg total) by mouth daily.   folic acid 331 MCG tablet Commonly known as:  FOLVITE Take 2,000 mcg by mouth daily.   levothyroxine 112 MCG tablet Commonly known as:  SYNTHROID, LEVOTHROID Take 1 tablet (112 mcg total) by mouth daily.   liothyronine 5 MCG tablet Commonly known as:  CYTOMEL Take 1 tablet (5 mcg total) by mouth daily.   olmesartan 40 MG tablet Commonly known as:  BENICAR Take 1 tablet (40 mg total) by mouth daily.   rosuvastatin 10 MG tablet Commonly known as:  CRESTOR Take 1 tablet (10 mg total) by mouth daily.   thiamine 100 MG tablet Commonly known as:  VITAMIN B-1 Take 1 tablet (100 mg total) by mouth daily.        Follow-up: Return in about 4 months (around 09/07/2018).  [name], M.D.

## 2018-05-08 LAB — CMP14+EGFR
ALT: 6 IU/L (ref 0–32)
AST: 10 IU/L (ref 0–40)
Albumin/Globulin Ratio: 1.8 (ref 1.2–2.2)
Albumin: 4.4 g/dL (ref 3.5–4.8)
Alkaline Phosphatase: 96 IU/L (ref 39–117)
BUN/Creatinine Ratio: 17 (ref 12–28)
BUN: 18 mg/dL (ref 8–27)
Bilirubin Total: 0.3 mg/dL (ref 0.0–1.2)
CO2: 23 mmol/L (ref 20–29)
Calcium: 9.8 mg/dL (ref 8.7–10.3)
Chloride: 101 mmol/L (ref 96–106)
Creatinine, Ser: 1.06 mg/dL — ABNORMAL HIGH (ref 0.57–1.00)
GFR calc Af Amer: 61 mL/min/{1.73_m2} (ref >59)
GFR calc non Af Amer: 53 mL/min/{1.73_m2} — ABNORMAL LOW (ref >59)
Globulin, Total: 2.4 g/dL (ref 1.5–4.5)
Glucose: 88 mg/dL (ref 65–99)
Potassium: 4.7 mmol/L (ref 3.5–5.2)
Sodium: 140 mmol/L (ref 134–144)
Total Protein: 6.8 g/dL (ref 6.0–8.5)

## 2018-05-08 LAB — T4, FREE: Free T4: 1.47 ng/dL (ref 0.82–1.77)

## 2018-05-08 LAB — TSH: TSH: 2.15 u[IU]/mL (ref 0.450–4.500)

## 2018-06-20 ENCOUNTER — Encounter: Payer: Self-pay | Admitting: Family Medicine

## 2018-06-20 ENCOUNTER — Ambulatory Visit (INDEPENDENT_AMBULATORY_CARE_PROVIDER_SITE_OTHER): Payer: Medicare HMO | Admitting: Family Medicine

## 2018-06-20 VITALS — BP 122/74 | HR 96 | Temp 97.5°F | Ht 66.5 in | Wt 170.4 lb

## 2018-06-20 DIAGNOSIS — B9689 Other specified bacterial agents as the cause of diseases classified elsewhere: Secondary | ICD-10-CM | POA: Diagnosis not present

## 2018-06-20 DIAGNOSIS — N760 Acute vaginitis: Secondary | ICD-10-CM

## 2018-06-20 LAB — WET PREP FOR TRICH, YEAST, CLUE
Clue Cell Exam: NEGATIVE
Trichomonas Exam: NEGATIVE
Yeast Exam: NEGATIVE

## 2018-06-20 MED ORDER — LIOTHYRONINE SODIUM 5 MCG PO TABS
5.0000 ug | ORAL_TABLET | Freq: Every day | ORAL | 1 refills | Status: DC
Start: 2018-06-20 — End: 2018-08-15

## 2018-06-20 MED ORDER — ROSUVASTATIN CALCIUM 10 MG PO TABS: 10.0000 mg | ORAL_TABLET | Freq: Every day | ORAL | 1 refills | Status: DC

## 2018-06-20 MED ORDER — METRONIDAZOLE 500 MG PO TABS: 500.0000 mg | ORAL_TABLET | Freq: Two times a day (BID) | ORAL | 0 refills | Status: DC

## 2018-06-20 NOTE — Progress Notes (Signed)
Chief Complaint  Patient presents with  . vaginal irritation    HPI  Patient presents today for onset 2 weeks ago of vaginal itching burning and discharge that has been increasing little by little.  She finally decided it was time to come in.  There has been some odor associated.  Over-the-counter medicines have not been useful.  PMH: Smoking status noted ROS: Per HPI  Objective: BP 122/74   Pulse 96   Temp (!) 97.5 F (36.4 C) (Oral)   Ht 5' 6.5" (1.689 m)   Wt 170 lb 6 oz (77.3 kg)   LMP 08/21/2013   BMI 27.09 kg/m  Gen: NAD, alert, cooperative with exam HEENT: NCAT, EOMI, PERRL CV: RRR, good S1/S2, no murmur Resp: CTABL, no wheezes, non-labored Abd: SNTND, BS present, no guarding or organomegaly Ext: No edema, warm Neuro: Alert and oriented, No gross deficits Wet prep shows large amounts of bacteria. Assessment and plan:  1. Bacterial vaginosis     Meds ordered this encounter  Medications  . metroNIDAZOLE (FLAGYL) 500 MG tablet    Sig: Take 1 tablet (500 mg total) by mouth 2 (two) times daily.    Dispense:  14 tablet    Refill:  0  . liothyronine (CYTOMEL) 5 MCG tablet    Sig: Take 1 tablet (5 mcg total) by mouth daily.    Dispense:  90 tablet    Refill:  1  . rosuvastatin (CRESTOR) 10 MG tablet    Sig: Take 1 tablet (10 mg total) by mouth daily.    Dispense:  90 tablet    Refill:  1    Orders Placed This Encounter  Procedures  . WET PREP FOR [place], YEAST, CLUE    Follow up as needed.  Claretta Fraise, MD

## 2018-06-20 NOTE — Patient Instructions (Signed)
Use Remifemin for hot flashes.   Heart-Healthy Eating Plan Heart-healthy meal planning includes:  Limiting unhealthy fats.  Increasing healthy fats.  Making other small dietary changes.  You may need to talk with your doctor or a diet specialist (dietitian) to create an eating plan that is right for you. What types of fat should I choose?  Choose healthy fats. These include olive oil and canola oil, flaxseeds, walnuts, almonds, and seeds.  Eat more omega-3 fats. These include salmon, mackerel, sardines, tuna, flaxseed oil, and ground flaxseeds. Try to eat fish at least twice each week.  Limit saturated fats. ? Saturated fats are often found in animal products, such as meats, butter, and cream. ? Plant sources of saturated fats include palm oil, palm kernel oil, and coconut oil.  Avoid foods with partially hydrogenated oils in them. These include stick margarine, some tub margarines, cookies, crackers, and other baked goods. These contain trans fats. What general guidelines do I need to follow?  Check food labels carefully. Identify foods with trans fats or high amounts of saturated fat.  Fill one half of your plate with vegetables and green salads. Eat 4-5 servings of vegetables per day. A serving of vegetables is: ? 1 cup of raw leafy vegetables. ?  cup of raw or cooked cut-up vegetables. ?  cup of vegetable juice.  Fill one fourth of your plate with whole grains. Look for the word "whole" as the first word in the ingredient list.  Fill one fourth of your plate with lean protein foods.  Eat 4-5 servings of fruit per day. A serving of fruit is: ? One medium whole fruit. ?  cup of dried fruit. ?  cup of fresh, frozen, or canned fruit. ?  cup of 100% fruit juice.  Eat more foods that contain soluble fiber. These include apples, broccoli, carrots, beans, peas, and barley. Try to get 20-30 g of fiber per day.  Eat more home-cooked food. Eat less restaurant, buffet, and  fast food.  Limit or avoid alcohol.  Limit foods high in starch and sugar.  Avoid fried foods.  Avoid frying your food. Try baking, boiling, grilling, or broiling it instead. You can also reduce fat by: ? Removing the skin from poultry. ? Removing all visible fats from meats. ? Skimming the fat off of stews, soups, and gravies before serving them. ? Steaming vegetables in water or broth.  Lose weight if you are overweight.  Eat 4-5 servings of nuts, legumes, and seeds per week: ? One serving of dried beans or legumes equals  cup after being cooked. ? One serving of nuts equals 1 ounces. ? One serving of seeds equals  ounce or one tablespoon.  You may need to keep track of how much salt or sodium you eat. This is especially true if you have high blood pressure. Talk with your doctor or dietitian to get more information. What foods can I eat? Grains Breads, including [place], white, pita, wheat, raisin, rye, oatmeal, and [place]. Tortillas that are neither fried nor made with lard or trans fat. Low-fat rolls, including hotdog and hamburger buns and English muffins. Biscuits. Muffins. Waffles. Pancakes. Light popcorn. Whole-grain cereals. Flatbread. Melba toast. Pretzels. Breadsticks. Rusks. Low-fat snacks. Low-fat crackers, including oyster, saltine, matzo, graham, animal, and rye. Rice and pasta, including brown rice and pastas that are made with whole wheat. Vegetables All vegetables. Fruits All fruits, but limit coconut. Meats and Other Protein Sources Lean, well-trimmed beef, veal, pork, and lamb. Chicken and  [place] without skin. All fish and shellfish. Wild duck, rabbit, pheasant, and venison. Egg whites or low-cholesterol egg substitutes. Dried beans, peas, lentils, and tofu. Seeds and most nuts. Dairy Low-fat or nonfat cheeses, including ricotta, string, and mozzarella. Skim or 1% milk that is liquid, powdered, or evaporated. Buttermilk that is made with low-fat milk. Nonfat or  low-fat yogurt. Beverages Mineral water. Diet carbonated beverages. Sweets and Desserts Sherbets and fruit ices. Honey, jam, marmalade, jelly, and syrups. Meringues and gelatins. Pure sugar candy, such as hard candy, jelly beans, gumdrops, mints, marshmallows, and small amounts of dark chocolate. [employer] Eat all sweets and desserts in moderation. Fats and Oils Nonhydrogenated (trans-free) margarines. Vegetable oils, including soybean, sesame, sunflower, olive, peanut, safflower, corn, canola, and cottonseed. Salad dressings or mayonnaise made with a vegetable oil. Limit added fats and oils that you use for cooking, baking, salads, and as spreads. Other Cocoa powder. Coffee and tea. All seasonings and condiments. The items listed above may not be a complete list of recommended foods or beverages. Contact your dietitian for more options. What foods are not recommended? Grains Breads that are made with saturated or trans fats, oils, or whole milk. Croissants. Butter rolls. Cheese breads. Sweet rolls. Donuts. Buttered popcorn. Chow mein noodles. High-fat crackers, such as cheese or butter crackers. Meats and Other Protein Sources Fatty meats, such as hotdogs, short ribs, sausage, spareribs, bacon, rib eye roast or steak, and mutton. High-fat deli meats, such as salami and bologna. Caviar. Domestic duck and goose. Organ meats, such as kidney, liver, sweetbreads, and heart. Dairy Cream, sour cream, cream cheese, and creamed cottage cheese. Whole-milk cheeses, including blue (bleu), [place] Jack, [place], [place], American, [place], Swiss, cheddar, [place], and [place]. Whole or 2% milk that is liquid, evaporated, or condensed. Whole buttermilk. Cream sauce or high-fat cheese sauce. Yogurt that is made from whole milk. Beverages Regular sodas and juice drinks with added sugar. Sweets and Desserts Frosting. Pudding. Cookies. Cakes other than angel food cake. Candy that has milk chocolate or  white chocolate, hydrogenated fat, butter, coconut, or unknown ingredients. Buttered syrups. Full-fat ice cream or ice cream drinks. Fats and Oils Gravy that has suet, meat fat, or shortening. Cocoa butter, hydrogenated oils, palm oil, coconut oil, palm kernel oil. These can often be found in baked products, candy, fried foods, nondairy creamers, and whipped toppings. Solid fats and shortenings, including bacon fat, salt pork, lard, and butter. Nondairy cream substitutes, such as coffee creamers and sour cream substitutes. Salad dressings that are made of unknown oils, cheese, or sour cream. The items listed above may not be a complete list of foods and beverages to avoid. Contact your dietitian for more information. This information is not intended to replace advice given to you by your health care provider. Make sure you discuss any questions you have with your health care provider. Document Released: 05/28/2012 Document Revised: 05/04/2016 Document Reviewed: 05/21/2014 Elsevier Interactive Patient Education  [employer].

## 2018-06-25 ENCOUNTER — Encounter: Payer: Self-pay | Admitting: *Deleted

## 2018-06-25 ENCOUNTER — Encounter: Payer: Self-pay | Admitting: Family

## 2018-06-25 ENCOUNTER — Ambulatory Visit (INDEPENDENT_AMBULATORY_CARE_PROVIDER_SITE_OTHER): Payer: Medicare HMO | Admitting: Family

## 2018-06-25 ENCOUNTER — Ambulatory Visit (INDEPENDENT_AMBULATORY_CARE_PROVIDER_SITE_OTHER): Payer: Medicare HMO | Admitting: *Deleted

## 2018-06-25 VITALS — BP 128/79 | HR 105 | Temp 98.0°F | Ht 66.5 in | Wt 170.0 lb

## 2018-06-25 VITALS — BP 145/77 | HR 117 | Ht 66.5 in | Wt 170.0 lb

## 2018-06-25 DIAGNOSIS — Z0000 Encounter for general adult medical examination without abnormal findings: Secondary | ICD-10-CM | POA: Diagnosis not present

## 2018-06-25 DIAGNOSIS — S41112A Laceration without foreign body of left upper arm, initial encounter: Secondary | ICD-10-CM

## 2018-06-25 NOTE — Patient Instructions (Signed)
Skin Tear Care A skin tear is a wound in which the top layers of skin have peeled off the deeper skin or tissues underneath them. This is a common problem as people get older because the skin becomes thinner and more fragile. In addition, some medicines, such as oral corticosteroids, can lead to skin thinning if they are taken for long periods of time. A skin tear is often repaired with tape or skin adhesive strips. Depending on the location of the wound, a bandage (dressing) may be applied over the tape or skin adhesive strips. Follow these instructions at home: Wound care  Clean the wound as told by your health care provider. You may be instructed to keep the wound dry for the first few days. If you are told to clean the wound: ? Wash the wound with mild soap and water or a salt-water (saline) solution. ? Rinse the wound with water to remove all soap. ? Do not rub the wound dry. Let the wound air dry.  Change any dressings as told by your health care provider. This includes changing the dressing if it gets wet, gets dirty, or starts to smell bad.  Do not scratch or pick at the wound.  Protect the injured area until it has healed.  Check your wound every day for signs of infection. Check for: ? More redness, swelling, or pain. ? More fluid or blood. ? Warmth. ? Pus or a bad smell. Medicines   Take over-the-counter and prescription medicines only as told by your health care provider.  If you were prescribed an antibiotic medicine, take or apply it as told by your health care provider. Do not stop using the antibiotic even if your condition improves. General instructions  Keep the dressing dry as told by your health care provider.  Do not take baths, swim, or do anything that puts your wound underwater until your health care provider approves.  Keep all follow-up visits as told by your health care provider. This is important. Contact a health care provider if:  You have more  redness, swelling, or pain around your wound.  You have more fluid or blood coming from your wound.  Your wound feels warm to the touch.  You have pus or a bad smell coming from your wound. Get help right away if:  You have a red streak that goes away from the skin tear.  You have a fever and chills and your symptoms suddenly get worse. This information is not intended to replace advice given to you by your health care provider. Make sure you discuss any questions you have with your health care provider. Document Released: 08/22/2001 Document Revised: 07/23/2016 Document Reviewed: 10/18/2015 Elsevier Interactive Patient Education  2018 Elsevier Inc.  

## 2018-06-25 NOTE — Progress Notes (Signed)
   Subjective:    Patient ID: [name], female    DOB: May 10, 1946, 72 y.o.   MRN: [id number]  Chief Complaint  Patient presents with  . Skin Problem    skin tear left upper arm    HPI Pt presents to the office today with left arm skin tear that occurred two nights ago when she fell going to the bathroom. She denies any pain or fevers.  She reports her son has been dressing it daily and reports a bloody discharge.     Review of Systems  Skin: Positive for wound.  All other systems reviewed and are negative.      Objective:   Physical Exam  Constitutional: She is oriented to person, place, and time. She appears well-developed and well-nourished. No distress.  HENT:  Head: Normocephalic.  Eyes: Pupils are equal, round, and reactive to light.  Neck: Normal range of motion. Neck supple. No thyromegaly present.  Cardiovascular: Normal rate, regular rhythm, normal heart sounds and intact distal pulses.  No murmur heard. Pulmonary/Chest: Effort normal and breath sounds normal. No respiratory distress. She has no wheezes.  Abdominal: Soft. Bowel sounds are normal. She exhibits no distension. There is no tenderness.  Musculoskeletal: Normal range of motion. She exhibits no edema or tenderness.  Neurological: She is alert and oriented to person, place, and time. She has normal reflexes. No cranial nerve deficit.  Skin: Skin is warm and dry. Bruising (skin tear on left lateral arm) noted.  Psychiatric: She has a normal mood and affect. Her behavior is normal. Judgment and thought content normal.  Vitals reviewed.      Area cleaned and Vaseline gauze applied, gauze, and cling. Pt tolerated well.   BP 128/79 (BP Location: Right Arm, Patient Position: Sitting, Cuff Size: Large)   Pulse (!) 105   Ht 5' 6.5" (1.689 m)   Wt 170 lb (77.1 kg)   LMP 08/21/2013   BMI 27.03 kg/m      Assessment & Plan:  [name] was seen today for skin problem.  Diagnoses and all orders for this  visit:  Skin tear of left upper arm without complication, initial encounter   Keep clean and dry Change daily Report any erythemas, discharge, or fever RTO in 2 weeks for recheck    Evelina Dun, FNP

## 2018-06-25 NOTE — Progress Notes (Addendum)
Subjective:   [name] is a 72 y.o. female who presents for a Medicare Annual Wellness Visit. [name] lives at home with her son and his girlfriend. Her husband passed away 16 years ago. She retired from [employer] years ago after suffering two strokes at work.     Review of Systems    Patient reports that her overall health is unchanged compared to last year.  Cardiac Risk Factors include: advanced age (>91men, >60 women);hypertension;sedentary lifestyle;dyslipidemia;smoking/ tobacco exposure  Musc: left knee pain. Needs replacement but is hesitant. Sees Dr [name] at [facility]. Has poor balance and relates it to her knee problems. Uses a 4 point cane.  Skin: Skin tear to left upper arm after falling against the sink 3 nights ago when she got up during the night to go to the restroom  All other systems negative       Current Medications (verified) Outpatient Encounter Medications as of 06/25/2018  Medication Sig  . acetaminophen (TYLENOL) 500 MG tablet Take 1 tablet (500 mg total) by mouth 2 (two) times daily. Pain  . alendronate (FOSAMAX) 70 MG tablet Take 1 tablet (70 mg total) by mouth once a week. Take with a full glass of water on an empty stomach.  Marland Kitchen aspirin EC 81 MG tablet Take 81 mg by mouth daily.  . Coenzyme Q10 (CO Q-10) 100 MG CAPS Take 100 mg by mouth daily.  . DULoxetine (CYMBALTA) 60 MG capsule Take 1 capsule (60 mg total) by mouth daily.  . folic acid (FOLVITE) 812 MCG tablet Take 2,000 mcg by mouth daily.  Marland Kitchen levothyroxine (SYNTHROID, LEVOTHROID) 112 MCG tablet Take 1 tablet (112 mcg total) by mouth daily.  Marland Kitchen liothyronine (CYTOMEL) 5 MCG tablet Take 1 tablet (5 mcg total) by mouth daily.  . metroNIDAZOLE (FLAGYL) 500 MG tablet Take 1 tablet (500 mg total) by mouth 2 (two) times daily.  Marland Kitchen olmesartan (BENICAR) 40 MG tablet Take 1 tablet (40 mg total) by mouth daily.  . rosuvastatin (CRESTOR) 10 MG tablet Take 1 tablet (10 mg total) by mouth daily.  Marland Kitchen  thiamine (VITAMIN B-1) 100 MG tablet Take 1 tablet (100 mg total) by mouth daily.   No facility-administered encounter medications on file as of 06/25/2018.     Allergies (verified) Ciprofloxacin; Procardia [nifedipine]; and Norvasc [amlodipine besylate]   History: Past Medical History:  Diagnosis Date  . Alcoholism ([place]) 01/09/2012  . Anemia   . Anxiety   . B12 deficiency   . Carotid artery narrowing 10/03/2012   On the right.  . Chronic kidney disease (CKD), stage II (mild)   . COPD (chronic obstructive pulmonary disease) ([place])   . Depression   . Headache(784.0)   . Hyperlipidemia   . Hypertension   . Hyponatremia 01/09/2012  . Hypothyroid   . Menopause   . Mini stroke ([place])   . Orthostatic syncope 10/02/2012  . Pneumonia   . SBO (small bowel obstruction) ([place]) 06/28/2016  . Shortness of breath   . Stroke [facility]) 2010   no deficits   Past Surgical History:  Procedure Laterality Date  . AGILE CAPSULE N/A 02/22/2016   dummy capsule remained in the distal ileum  . APPENDECTOMY    . CATARACT EXTRACTION W/PHACO  06/20/2012   Procedure: CATARACT EXTRACTION PHACO AND INTRAOCULAR LENS PLACEMENT (IOC);  Surgeon: Tonny Branch, MD;  Location: [facility];  Service: Ophthalmology;  Laterality: Right;  CDE:10.66  . CATARACT EXTRACTION W/PHACO  07/11/2012   Procedure: CATARACT EXTRACTION  PHACO AND INTRAOCULAR LENS PLACEMENT (IOC);  Surgeon: Tonny Branch, MD;  Location: [facility];  Service: Ophthalmology;  Laterality: Left;  CDE: 12.69  . COLONOSCOPY N/A 12/17/2015   RMR: Marked circumferential ulceration of the ascending colon/Ileocecal valve ulceration with biopsy most consistent with NSAID injury versus inflammatory bowel disease, pancolonoc diverticulosis redundant colon  . ESOPHAGOGASTRODUODENOSCOPY N/A 12/17/2015   QJJ:HERDEYCXK gastric polyp, biopsy benign  . KNEE ARTHROSCOPY Right 09/2016  . LUMBAR LAMINECTOMY/DECOMPRESSION MICRODISCECTOMY N/A 05/25/2017   Procedure: CENTRAL DECOMPRESSIVE LUMBAR  LAMINECTOMY FOR SPINAL STENOSIS L3-L4, FORAMINOTOMY FOR THE L4 ROOT AND L5 ROOT;  Surgeon: Latanya Maudlin, MD;  Location: [facility];  Service: Orthopedics;  Laterality: N/A;  . TONSILLECTOMY     Family History  Problem Relation Age of Onset  . GER disease Mother   . Ulcers Mother   . Hypertension Maternal Grandmother    Social History   Socioeconomic History  . Marital status: Widowed    Spouse name: Not on file  . Number of children: 2  . Years of education: 75  . Highest education level: Some college, no degree  Occupational History  . 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 Financial resource strain: Not hard at all  . Food insecurity:    Worry: Never true    Inability: Never true  . Transportation needs:    Medical: No    Non-medical: No  Tobacco Use  . Smoking status: Former Smoker    Packs/day: 2.00    Years: 30.00    Pack years: 60.00    Types: Cigarettes    Last attempt to quit: 06/17/1992    Years since quitting: 26.0  . Smokeless tobacco: Never Used  Substance and Sexual Activity  . Alcohol use: No    Alcohol/week: 8.4 oz    Types: 14 Standard drinks or equivalent per week  . Drug use: No  . Sexual activity: Not Currently  Lifestyle  . Physical activity:    Days per week: 0 days    Minutes per session: 0 min  . Stress: Not on file  Relationships  . Social connections:    Talks on phone: Not on file    Gets together: Not on file    Attends religious service: Not on file    Active member of club or organization: Not on file    Attends meetings of clubs or organizations: Not on file    Relationship status: Not on file  Other Topics Concern  . Not on file  Social History Narrative  . Not on file    Tobacco Use No.  Clinical Intake:  Pre-visit preparation completed: No  Pain : 0-10 Pain Score: 5  Pain Type: Chronic pain Pain Location: Knee Pain Orientation: Left Pain Descriptors /  Indicators: Aching Pain Onset: More than a month ago Pain Frequency: Constant Pain Relieving Factors: rest Effect of Pain on Daily Activities: significant-unable to exercise  Pain Relieving Factors: rest  Nutritional Status: BMI 25 -29 Overweight Diabetes: No  How often do you need to have someone help you when you read instructions, pamphlets, or other written materials from your doctor or pharmacy?: 1 - Never What is the last grade level you completed in school?: 75 college   Interpreter Needed?: No  Information entered by :: Chong Sicilian, RN   Activities of Daily Living In your present state of health, do you have any difficulty performing the following activities:  06/25/2018 09/18/2017  Hearing? N N  Vision? N N  Comment Last eye exam was over a year -  Difficulty concentrating or making decisions? Y Y  Comment Has some difficulty with recall sometimes some problems with remembering names and names of products sometimes  Walking or climbing stairs? Y Y  Comment Left knee pain due to tears Uses handrails. Has two steps onto her porch and she uses the rails  Dressing or bathing? N N  Doing errands, shopping? N N  Preparing Food and eating ? N N  Using the Toilet? N N  In the past six months, have you accidently leaked urine? Y Y  Comment - some urge incontinence but not daily  Do you have problems with loss of bowel control? N N  Managing your Medications? N N  Comment - Sets pillbox up weekly  Managing your Finances? N N  Housekeeping or managing your Housekeeping? N Y  Comment - some problems due to balance  Some recent data might be hidden     Diet Drinks coffee in the morning and mostly water during the day.  Eats most meals at home  Exercise  unable to exercise right now due to left knee pain and decreased ROM.    Depression Screen PHQ 2/9 Scores 06/25/2018 06/20/2018 05/07/2018 02/19/2018 09/24/2017 09/18/2017 08/22/2017  PHQ - 2 Score 1 0 0 0 0 0 0     Fall  Risk Fall Risk  06/25/2018 06/20/2018 05/07/2018 02/19/2018 09/24/2017  Falls in the past year? Yes No No No Yes  Number falls in past yr: 1 - - - 2 or more  Injury with Fall? Yes - - - No  Comment skin tear to left upper arm - - - -  Risk Factor Category  High Fall Risk - - - High Fall Risk  Risk for fall due to : History of fall(s) - - - Impaired balance/gait  Risk for fall due to: Comment - - - - -  Follow up Falls prevention discussed - - - Falls prevention discussed    Safety Is the patient's home free of loose throw rugs in walkways, pet beds, electrical cords, etc?   yes      Grab bars in the bathroom? Curtain rod is securely attached and she uses that to hold onto getting in and out of the tub      Point shower? no      Shower Seat? yes      Handrails on the stairs?   yes      Adequate lighting?   yes  Patient Care Team: Claretta Fraise, MD as PCP - General (Family Medicine) Gala Romney Cristopher Estimable, MD as Consulting Physician (Gastroenterology) Latanya Maudlin, MD as Consulting Physician (Orthopedic Surgery)  Hospitalizations, surgeries, and ER visits in previous 12 months No hospitalizations, ER visits, or surgeries this past year.  Objective:    Today's Vitals   06/25/18 1423 06/25/18 1440  BP: (!) 145/77   Pulse: (!) 117   Weight: 170 lb (77.1 kg)   Height: 5' 6.5" (1.689 m)   PainSc:  5    Body mass index is 27.03 kg/m.  Advanced Directives 09/18/2017 05/25/2017 05/18/2017 08/28/2016 06/29/2016 06/28/2016 02/14/2016  Does Patient Have a Medical Advance Directive? Yes Yes Yes Yes Yes Yes Yes  Type of Paramedic of [place];Living will [place];Living will [place];Living will [place];Living will Living will Living will [place];Living  will  Does patient want to make changes to medical advance directive? No - Patient declined No - Patient declined - - No - Patient declined - -    Copy of [place] in Chart? No - copy requested Yes Yes - No - copy requested No - copy requested -  Would patient like information on creating a medical advance directive? - - - - - - -  Pre-existing out of facility DNR order (yellow form or pink MOST form) - - - - - - -    Hearing/Vision  normal or No deficits noted during visit.  Cognitive Function: MMSE - Mini Mental State Exam 06/25/2018 09/18/2017  Orientation to time 5 4  Orientation to Place 5 5  Registration 3 3  Attention/ Calculation 5 5  Recall 3 2  Language- name 2 objects 2 2  Language- repeat 1 1  Language- follow 3 step command 2 3  Language- read & follow direction 1 1  Write a sentence 1 1  Copy design 1 0  Total score 29 27       Normal Cognitive Function Screening: Yes    Immunizations and Health Maintenance Immunization History  Administered Date(s) Administered  . Influenza, High Dose Seasonal PF 09/23/2014, 09/11/2016, 09/05/2017  . Influenza,inj,Quad PF,6+ Mos 10/01/2013, 09/10/2015  . Pneumococcal Conjugate-13 05/12/2016  . Pneumococcal Polysaccharide-23 01/10/2012, 04/27/2015  . Tdap 12/14/2015   There are no preventive care reminders to display for this patient. Health Maintenance  Topic Date Due  . INFLUENZA VACCINE  07/11/2018  . MAMMOGRAM  10/31/2019  . TETANUS/TDAP  12/13/2025  . COLONOSCOPY  12/16/2025  . DEXA SCAN  Completed  . Hepatitis C Screening  Completed  . PNA vac Low Risk Adult  Completed        Assessment:   This is a routine wellness examination for [name].    Plan:    Goals    . Exercise 3x per week (30 min per time)     Chair exercises for 15 minutes daily. Refer to handout that was given.     Marland Kitchen LIFESTYLE - DECREASE FALLS RISK     Limit fluids after supper and recommend a bedside commode          Health Maintenance Recommendations: Screening mammography-scheduled   Additional Screening Recommendations: Lung: Low Dose CT Chest  recommended if Age 19-80 years, 30 pack-year currently smoking OR have quit w/in 15years. Patient does qualify. Hepatitis C Screening recommended: no  Today's Orders No orders of the defined types were placed in this encounter.   Keep f/u with Claretta Fraise, MD and any other specialty appointments you may have Continue current medications Move carefully to avoid falls. Continue to use cane for balance. Recommend a bedside commode to use at night. Limit fluid intake after supper Aim for at least 150 minutes of moderate activity a week. This can be done with chair exercises if necessary. Read or work on puzzles daily Stay connected with friends and family  I have personally reviewed and noted the following in the patient's chart:   . Medical and social history . Use of alcohol, tobacco or illicit drugs  . Current medications and supplements . Functional ability and status . Nutritional status . Physical activity . Advanced directives . List of other physicians . Hospitalizations, surgeries, and ER visits in previous 12 months . Vitals . Screenings to include cognitive, depression, and falls . Referrals and appointments  In addition, I have reviewed and  discussed with patient certain preventive protocols, quality metrics, and best practice recommendations. A written personalized care plan for preventive services as well as general preventive health recommendations were provided to patient.     Chong Sicilian, RN   06/25/2018     I have reviewed and agree with the above AWV documentation.  Claretta Fraise, M.D.

## 2018-06-25 NOTE — Patient Instructions (Signed)
  Ms. [name] , Thank you for taking time to come for your Medicare Wellness Visit. I appreciate your ongoing commitment to your health goals. Please review the following plan we discussed and let me know if I can assist you in the future.   These are the goals we discussed: Goals    . Exercise 3x per week (30 min per time)     Chair exercises for 15 minutes daily. Refer to handout that was given.     Marland Kitchen LIFESTYLE - DECREASE FALLS RISK     Limit fluids after supper and recommend a bedside commode         This is a list of the screening recommended for you and due dates:  Health Maintenance  Topic Date Due  . Flu Shot  07/11/2018  . Mammogram  10/31/2019  . Tetanus Vaccine  12/13/2025  . Colon Cancer Screening  12/16/2025  . DEXA scan (bone density measurement)  Completed  .  Hepatitis C: One time screening is recommended by Center for Disease Control  (CDC) for  adults born from [birth year] through 1965.   Completed  . Pneumonia vaccines  Completed

## 2018-07-09 ENCOUNTER — Ambulatory Visit (INDEPENDENT_AMBULATORY_CARE_PROVIDER_SITE_OTHER): Payer: Medicare HMO | Admitting: Family

## 2018-07-09 ENCOUNTER — Encounter: Payer: Self-pay | Admitting: Family

## 2018-07-09 VITALS — BP 149/93 | HR 98 | Temp 97.1°F | Ht 66.5 in | Wt 168.4 lb

## 2018-07-09 DIAGNOSIS — Z5189 Encounter for other specified aftercare: Secondary | ICD-10-CM

## 2018-07-09 DIAGNOSIS — S41112D Laceration without foreign body of left upper arm, subsequent encounter: Secondary | ICD-10-CM | POA: Diagnosis not present

## 2018-07-09 NOTE — Patient Instructions (Signed)
Skin Tear Care A skin tear is a wound in which the top layers of skin have peeled off the deeper skin or tissues underneath them. This is a common problem as people get older because the skin becomes thinner and more fragile. In addition, some medicines, such as oral corticosteroids, can lead to skin thinning if they are taken for long periods of time. A skin tear is often repaired with tape or skin adhesive strips. Depending on the location of the wound, a bandage (dressing) may be applied over the tape or skin adhesive strips. Follow these instructions at home: Wound care  Clean the wound as told by your health care provider. You may be instructed to keep the wound dry for the first few days. If you are told to clean the wound: ? Wash the wound with mild soap and water or a salt-water (saline) solution. ? Rinse the wound with water to remove all soap. ? Do not rub the wound dry. Let the wound air dry.  Change any dressings as told by your health care provider. This includes changing the dressing if it gets wet, gets dirty, or starts to smell bad.  Do not scratch or pick at the wound.  Protect the injured area until it has healed.  Check your wound every day for signs of infection. Check for: ? More redness, swelling, or pain. ? More fluid or blood. ? Warmth. ? Pus or a bad smell. Medicines   Take over-the-counter and prescription medicines only as told by your health care provider.  If you were prescribed an antibiotic medicine, take or apply it as told by your health care provider. Do not stop using the antibiotic even if your condition improves. General instructions  Keep the dressing dry as told by your health care provider.  Do not take baths, swim, or do anything that puts your wound underwater until your health care provider approves.  Keep all follow-up visits as told by your health care provider. This is important. Contact a health care provider if:  You have more  redness, swelling, or pain around your wound.  You have more fluid or blood coming from your wound.  Your wound feels warm to the touch.  You have pus or a bad smell coming from your wound. Get help right away if:  You have a red streak that goes away from the skin tear.  You have a fever and chills and your symptoms suddenly get worse. This information is not intended to replace advice given to you by your health care provider. Make sure you discuss any questions you have with your health care provider. Document Released: 08/22/2001 Document Revised: 07/23/2016 Document Reviewed: 10/18/2015 Elsevier Interactive Patient Education  2018 Elsevier Inc.  

## 2018-07-09 NOTE — Progress Notes (Signed)
   Subjective:    Patient ID: [name], female    DOB: 1946/10/04, 72 y.o.   MRN: [id number]  Chief Complaint  Patient presents with  . skin tear    two week recheck    Wound Check  She was originally treated more than 14 days ago. Prior ED Treatment: Vaseline dressing. The maximum temperature noted was less than 100.4 F. There has been no drainage from the wound. The redness has improved. The swelling has improved. There is no pain present. She has no difficulty moving the affected extremity or digit.      Review of Systems  Skin: Positive for wound.  All other systems reviewed and are negative.      Objective:   Physical Exam       BP (!) 149/93   Pulse 98   Temp (!) 97.1 F (36.2 C) (Oral)   Ht 5' 6.5" (1.689 m)   Wt 168 lb 6.4 oz (76.4 kg)   LMP 08/21/2013   BMI 26.77 kg/m      Assessment & Plan:  1. Skin tear of left upper arm without complication, subsequent encounter  2. Visit for wound check   Greatly improved!! Can stop Vaseline dressing  Fall preventions discussed Report any fevers, increase erythemas, or discharge  Evelina Dun, FNP

## 2018-07-14 IMAGING — DX DG RIBS W/ CHEST 3+V*R*
3 series · 3 of 3 positions shown · non-contrast
Comparison: Chest x-ray of June 28, 2016

CLINICAL DATA: Status post fall on [REDACTED] with persistent right
chest wall discomfort.

EXAM:
RIGHT RIBS AND CHEST - 3+ VIEW

[chest pa]
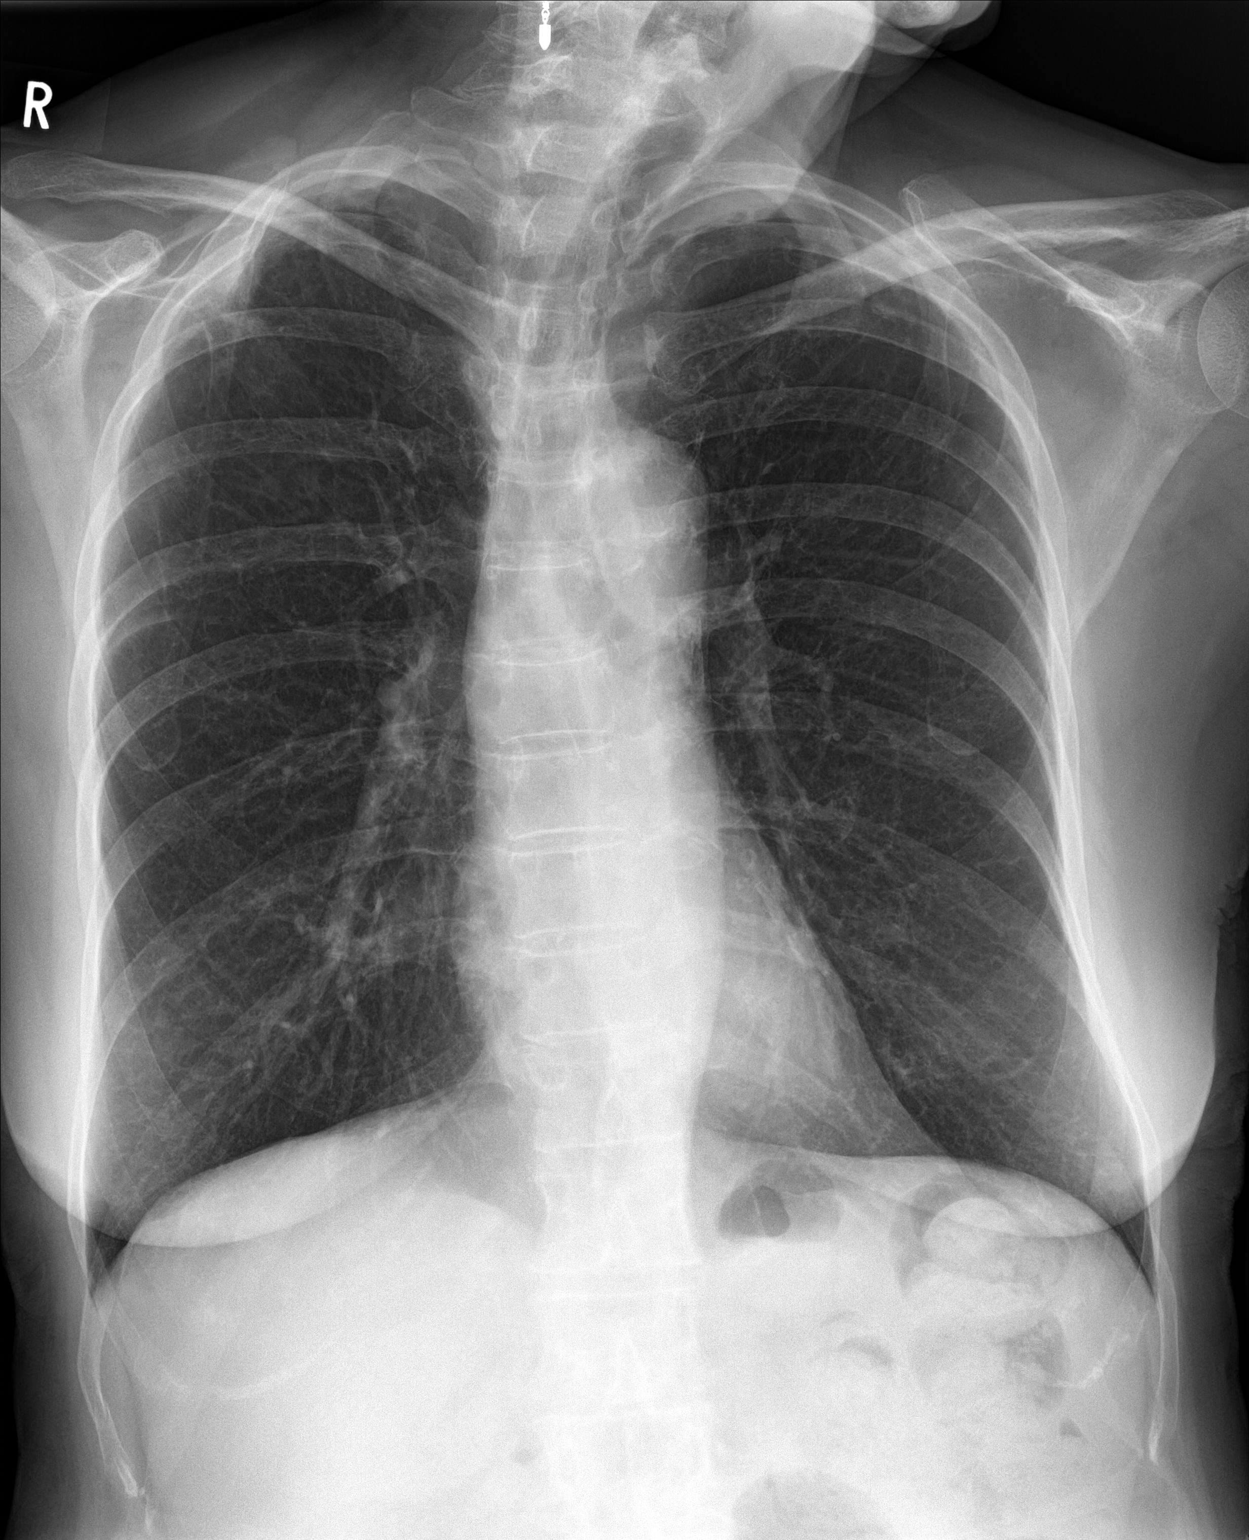

[rib obl (1 of 2)]
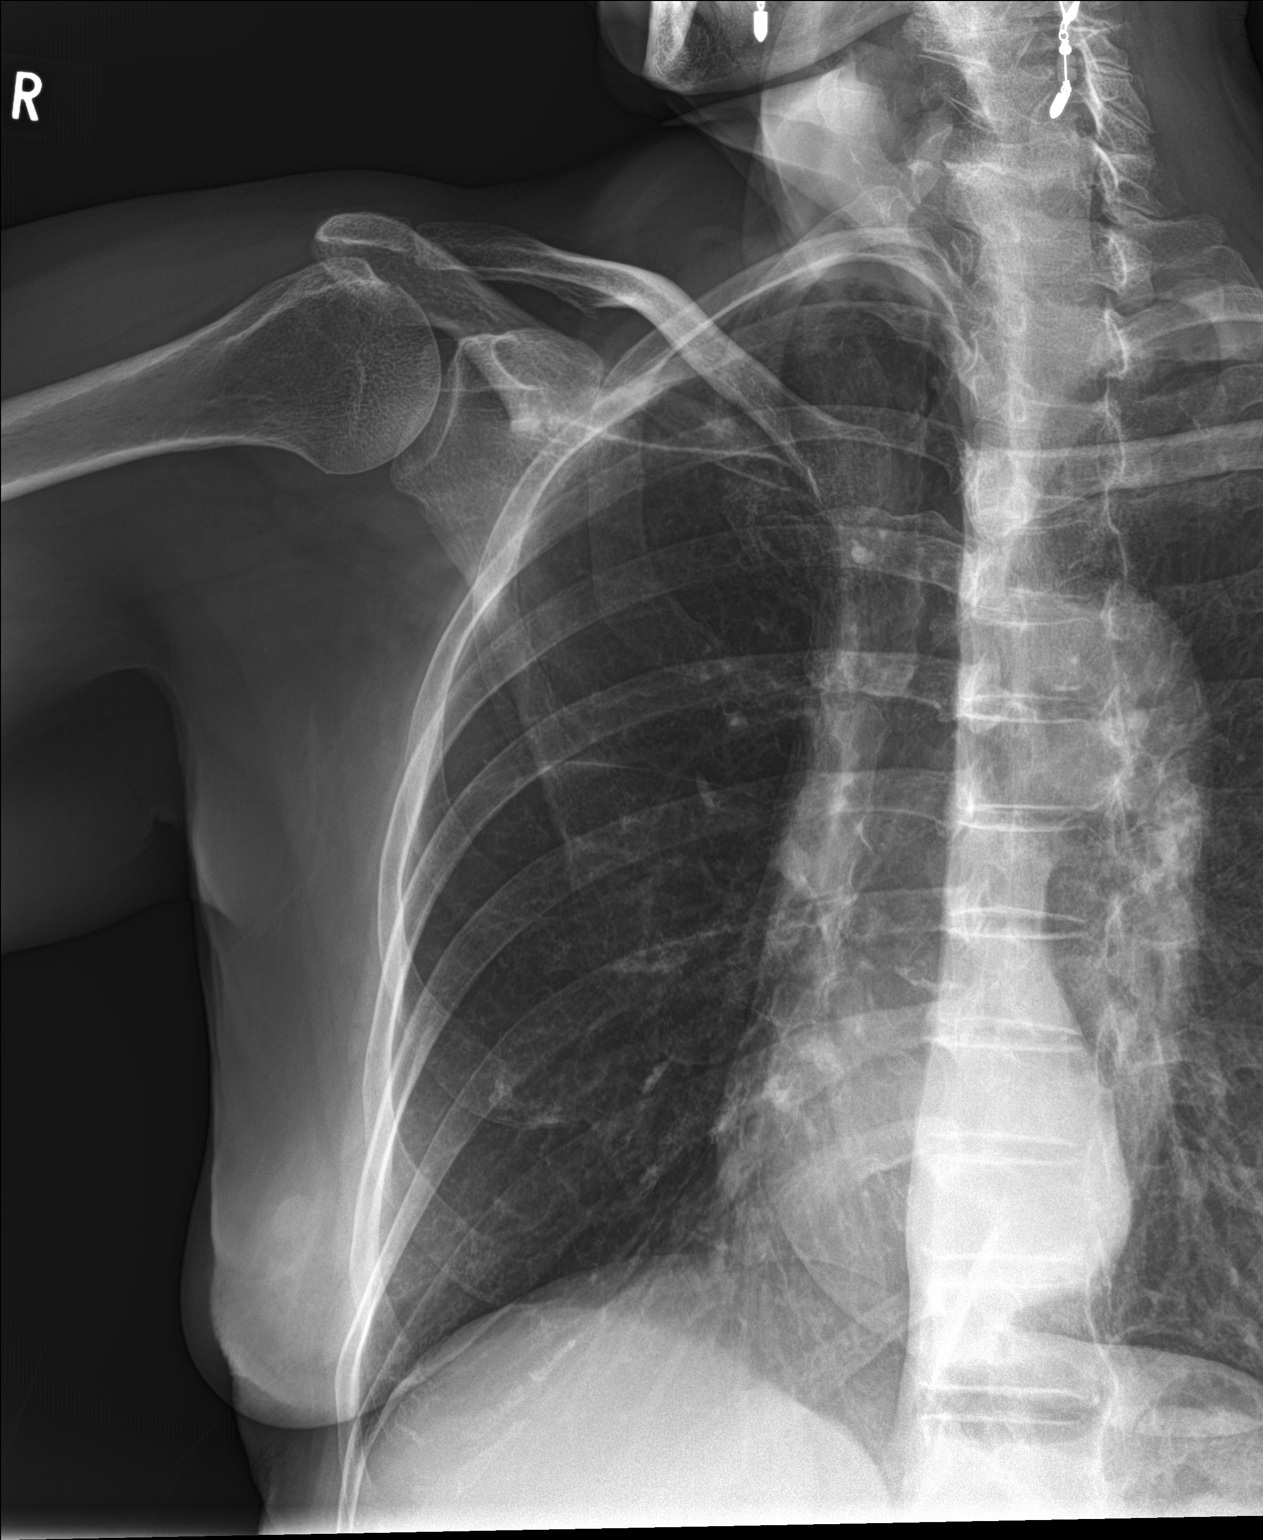

[rib obl (2 of 2)]
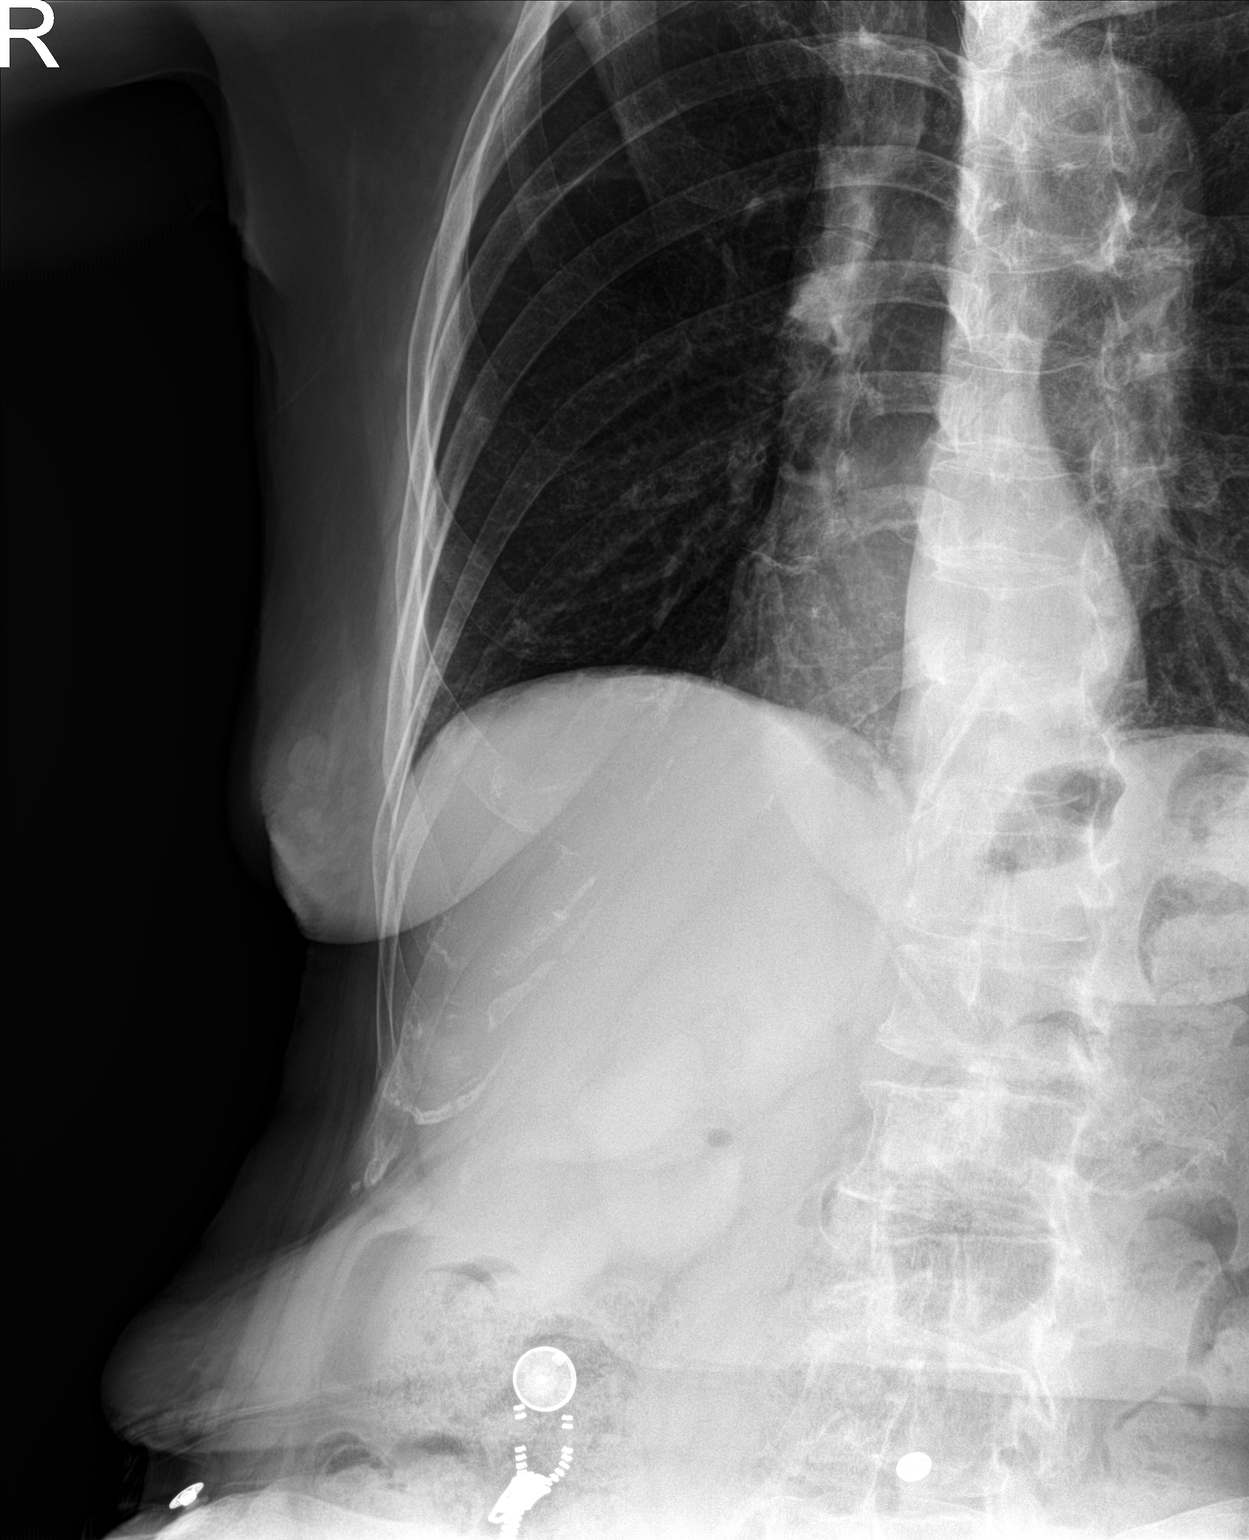

[3 of 3 positions shown; findings below may reference images not displayed]

FINDINGS: The lungs are hyperinflated. There is no focal infiltrate. There is
no pleural effusion or pneumothorax. There is stable biapical
pleural thickening. The heart and pulmonary vascularity are normal.
There is calcification within the wall of the aortic arch.

Right rib detail images reveal no displaced or nondisplaced right
rib fracture.
IMPRESSION: No acute right rib fracture is observed.

COPD.  No pneumonia, pleural effusion, or pneumothorax.

Aortic atherosclerosis.

## 2018-07-26 ENCOUNTER — Telehealth: Payer: Self-pay | Admitting: Family Medicine

## 2018-07-26 ENCOUNTER — Other Ambulatory Visit: Payer: Self-pay | Admitting: Family Medicine

## 2018-07-26 MED ORDER — SULFAMETHOXAZOLE-TRIMETHOPRIM 800-160 MG PO TABS: 1.0000 | ORAL_TABLET | Freq: Two times a day (BID) | ORAL | 0 refills | Status: DC

## 2018-07-26 NOTE — Telephone Encounter (Signed)
She's usually pretty reliable so I sent a scrip for Bactrim to [facility], [place]

## 2018-07-26 NOTE — Telephone Encounter (Signed)
Patient aware.

## 2018-07-26 NOTE — Telephone Encounter (Signed)
Advised patient that we normally have patient come in and check urine but she insists that she just can't make it and wanted me to ask if you would send in medication. Please advise

## 2018-07-26 NOTE — Telephone Encounter (Signed)
What symptoms do you have? Burning and discomfort and can't hold her water  How long have you been sick? Yesterday  Have you been seen for this problem? Off and on  If your provider decides to give you a prescription, which pharmacy would you like for it to be sent to? [facility] in [place]   Patient informed that this information will be sent to the clinical staff for review and that they should receive a follow up call.

## 2018-08-15 ENCOUNTER — Ambulatory Visit (INDEPENDENT_AMBULATORY_CARE_PROVIDER_SITE_OTHER): Payer: Medicare HMO | Admitting: Family Medicine

## 2018-08-15 ENCOUNTER — Encounter: Payer: Self-pay | Admitting: Family Medicine

## 2018-08-15 VITALS — BP 141/76 | HR 99 | Temp 97.9°F | Ht 66.0 in | Wt 166.0 lb

## 2018-08-15 DIAGNOSIS — R232 Flushing: Secondary | ICD-10-CM

## 2018-08-15 DIAGNOSIS — D500 Iron deficiency anemia secondary to blood loss (chronic): Secondary | ICD-10-CM

## 2018-08-15 DIAGNOSIS — I10 Essential (primary) hypertension: Secondary | ICD-10-CM

## 2018-08-15 MED ORDER — OLMESARTAN MEDOXOMIL 40 MG PO TABS: 40.0000 mg | ORAL_TABLET | Freq: Every day | ORAL | 1 refills | Status: DC

## 2018-08-15 MED ORDER — DULOXETINE HCL 30 MG PO CPEP: ORAL_CAPSULE | ORAL | 1 refills | Status: DC

## 2018-08-15 MED ORDER — DULOXETINE HCL 30 MG PO CPEP: 60.0000 mg | ORAL_CAPSULE | Freq: Every day | ORAL | 2 refills | Status: DC

## 2018-08-15 MED ORDER — ALENDRONATE SODIUM 70 MG PO TABS: 70.0000 mg | ORAL_TABLET | ORAL | 1 refills | Status: DC

## 2018-08-15 MED ORDER — ROSUVASTATIN CALCIUM 10 MG PO TABS: 10.0000 mg | ORAL_TABLET | Freq: Every day | ORAL | 1 refills | Status: DC

## 2018-08-15 MED ORDER — LIOTHYRONINE SODIUM 5 MCG PO TABS: ORAL_TABLET | ORAL | 1 refills | Status: DC

## 2018-08-15 MED ORDER — LEVOTHYROXINE SODIUM 112 MCG PO TABS: 112.0000 ug | ORAL_TABLET | Freq: Every day | ORAL | 1 refills | Status: DC

## 2018-08-15 NOTE — Progress Notes (Signed)
Subjective:  Patient ID: [name], female    DOB: 31-Aug-1946  Age: 72 y.o. MRN: [id number]  CC: Still having hot flashes   HPI NAQUISHA WHITEHAIR presents for hot flashes so bad that she wakes up drenched as if she is wet the bed during the night.  Severe during the day primarily sweating with a sensation of burning up from the inside.  This is not associated with any chest pain palpitation shortness of breath or change in level of consciousness.  Patient has been off estrogens for about 5 years.  These things seem to be just getting worse.  They occur multiple times every day.  They seem to be getting more severe with each occurrence and occurring more frequently.  Depression screen [facility] 2/9 08/15/2018 07/09/2018 06/25/2018  Decreased Interest 0 1 0  Down, Depressed, Hopeless 0 0 1  PHQ - 2 Score 0 1 1    History Pierina has a past medical history of Alcoholism ([place]) (01/09/2012), Anemia, Anxiety, B12 deficiency, Carotid artery narrowing (10/03/2012), Chronic kidney disease (CKD), stage II (mild), COPD (chronic obstructive pulmonary disease) ([place]), Depression, Headache(784.0), Hyperlipidemia, Hypertension, Hyponatremia (01/09/2012), Hypothyroid, Menopause, Mini stroke ([place]), Orthostatic syncope (10/02/2012), Pneumonia, SBO (small bowel obstruction) ([place]) (06/28/2016), Shortness of breath, and Stroke ([place]) (2010).   She has a past surgical history that includes Appendectomy; Tonsillectomy; Cataract extraction w/PHACO (06/20/2012); Cataract extraction w/PHACO (07/11/2012); Colonoscopy (N/A, 12/17/2015); Esophagogastroduodenoscopy (N/A, 12/17/2015); Agile capsule (N/A, 02/22/2016); Knee arthroscopy (Right, 09/2016); and Lumbar laminectomy/decompression microdiscectomy (N/A, 05/25/2017).   Her family history includes GER disease in her mother; Hypertension in her maternal grandmother; Ulcers in her mother.She reports that she quit smoking about 26 years ago. Her smoking use included cigarettes. She has a 60.00  pack-year smoking history. She has never used smokeless tobacco. She reports that she does not drink alcohol or use drugs.    ROS Review of Systems  Constitutional: Negative for fever.  HENT: Negative for congestion, rhinorrhea and sore throat.   Respiratory: Negative for cough and shortness of breath.   Cardiovascular: Negative for chest pain and palpitations.  Gastrointestinal: Negative for abdominal pain.  Musculoskeletal: Negative for arthralgias and myalgias.    Objective:  BP (!) 141/76   Pulse 99   Temp 97.9 F (36.6 C) (Oral)   Ht _0  (1.676 m)   Wt 166 lb (75.3 kg)   LMP 08/21/2013   BMI 26.79 kg/m   BP Readings from Last 3 Encounters:  08/15/18 (!) 141/76  07/09/18 (!) 149/93  06/25/18 128/79    Wt Readings from Last 3 Encounters:  08/15/18 166 lb (75.3 kg)  07/09/18 168 lb 6.4 oz (76.4 kg)  06/25/18 170 lb (77.1 kg)     Physical Exam  Constitutional: She is oriented to person, place, and time. She appears well-developed and well-nourished. No distress.  HENT:  Head: Normocephalic and atraumatic.  Eyes: Pupils are equal, round, and reactive to light. Conjunctivae are normal.  Neck: Normal range of motion. Neck supple. No thyromegaly present.  Cardiovascular: Normal rate, regular rhythm and normal heart sounds.  No murmur heard. Pulmonary/Chest: Effort normal and breath sounds normal. No respiratory distress. She has no wheezes. She has no rales.  Abdominal: Soft. Bowel sounds are normal. She exhibits no distension. There is no tenderness.  Musculoskeletal: Normal range of motion.  Lymphadenopathy:    She has no cervical adenopathy.  Neurological: She is alert and oriented to person, place, and time.  Skin: Skin is warm and dry.  Psychiatric: She has a normal mood and affect. Her behavior is normal. Judgment and thought content normal.      Assessment & Plan:   [place] was seen today for still having hot flashes.  Diagnoses and all orders for  this visit:  Hot flashes -     FSH/LH -     Thyroid Panel With TSH -     T4, Free  Essential hypertension -     CMP14+EGFR  Iron deficiency anemia due to chronic blood loss -     CBC with Differential/Platelet  Other orders -     liothyronine (CYTOMEL) 5 MCG tablet; Take 1/2 tab (2.5) daily -     Discontinue: DULoxetine (CYMBALTA) 30 MG capsule; Take 2 capsules (60 mg total) by mouth daily. -     rosuvastatin (CRESTOR) 10 MG tablet; Take 1 tablet (10 mg total) by mouth daily. -     olmesartan (BENICAR) 40 MG tablet; Take 1 tablet (40 mg total) by mouth daily. -     levothyroxine (SYNTHROID, LEVOTHROID) 112 MCG tablet; Take 1 tablet (112 mcg total) by mouth daily. -     alendronate (FOSAMAX) 70 MG tablet; Take 1 tablet (70 mg total) by mouth once a week. Take with a full glass of water on an empty stomach. -     DULoxetine (CYMBALTA) 30 MG capsule; Take I capsule daily       I have discontinued [name]. Price's DULoxetine and sulfamethoxazole-trimethoprim. I have also changed her liothyronine and DULoxetine. Additionally, I am having her maintain her aspirin EC, thiamine, acetaminophen, Co V-25, folic acid, rosuvastatin, olmesartan, levothyroxine, and alendronate.  Allergies as of 08/15/2018      Reactions   Ciprofloxacin Other (See Comments)   Caused pt to have delusional thoughts and hallucinations   Procardia [nifedipine] Swelling   Edema    Norvasc [amlodipine Besylate] Hives      Medication List        Accurate as of 08/15/18  5:58 PM. Always use your most recent med list.          acetaminophen 500 MG tablet Commonly known as:  TYLENOL Take 1 tablet (500 mg total) by mouth 2 (two) times daily. Pain   alendronate 70 MG tablet Commonly known as:  FOSAMAX Take 1 tablet (70 mg total) by mouth once a week. Take with a full glass of water on an empty stomach.   aspirin EC 81 MG tablet Take 81 mg by mouth daily.   Co Q-10 100 MG Caps Take 100 mg by mouth daily.     DULoxetine 30 MG capsule Commonly known as:  CYMBALTA Take I capsule daily   folic acid 366 MCG tablet Commonly known as:  FOLVITE Take 2,000 mcg by mouth daily.   levothyroxine 112 MCG tablet Commonly known as:  SYNTHROID, LEVOTHROID Take 1 tablet (112 mcg total) by mouth daily.   liothyronine 5 MCG tablet Commonly known as:  CYTOMEL Take 1/2 tab (2.5) daily   olmesartan 40 MG tablet Commonly known as:  BENICAR Take 1 tablet (40 mg total) by mouth daily.   rosuvastatin 10 MG tablet Commonly known as:  CRESTOR Take 1 tablet (10 mg total) by mouth daily.   thiamine 100 MG tablet Commonly known as:  VITAMIN B-1 Take 1 tablet (100 mg total) by mouth daily.        Follow-up: Return in about 6 weeks (around 09/26/2018).  Claretta Fraise, M.D.

## 2018-08-16 LAB — CMP14+EGFR
ALT: 8 IU/L (ref 0–32)
AST: 13 IU/L (ref 0–40)
Albumin/Globulin Ratio: 1.5 (ref 1.2–2.2)
Albumin: 4.5 g/dL (ref 3.5–4.8)
Alkaline Phosphatase: 106 IU/L (ref 39–117)
BUN/Creatinine Ratio: 12 (ref 12–28)
BUN: 10 mg/dL (ref 8–27)
Bilirubin Total: 0.8 mg/dL (ref 0.0–1.2)
CO2: 19 mmol/L — ABNORMAL LOW (ref 20–29)
Calcium: 9.3 mg/dL (ref 8.7–10.3)
Chloride: 98 mmol/L (ref 96–106)
Creatinine, Ser: 0.86 mg/dL (ref 0.57–1.00)
GFR calc Af Amer: 79 mL/min/{1.73_m2} (ref >59)
GFR calc non Af Amer: 69 mL/min/{1.73_m2} (ref >59)
Globulin, Total: 3 g/dL (ref 1.5–4.5)
Glucose: 73 mg/dL (ref 65–99)
Potassium: 4.5 mmol/L (ref 3.5–5.2)
Sodium: 139 mmol/L (ref 134–144)
Total Protein: 7.5 g/dL (ref 6.0–8.5)

## 2018-08-16 LAB — CBC WITH DIFFERENTIAL/PLATELET
Basophils Absolute: 0 10*3/uL (ref 0.0–0.2)
Basos: 1 %
EOS (ABSOLUTE): 0.1 10*3/uL (ref 0.0–0.4)
Eos: 2 %
Hematocrit: 34.8 % (ref 34.0–46.6)
Hemoglobin: 11.6 g/dL (ref 11.1–15.9)
Immature Grans (Abs): 0 10*3/uL (ref 0.0–0.1)
Immature Granulocytes: 0 %
Lymphocytes Absolute: 2 10*3/uL (ref 0.7–3.1)
Lymphs: 41 %
MCH: 29.1 pg (ref 26.6–33.0)
MCHC: 33.3 g/dL (ref 31.5–35.7)
MCV: 87 fL (ref 79–97)
Monocytes Absolute: 0.3 10*3/uL (ref 0.1–0.9)
Monocytes: 6 %
Neutrophils Absolute: 2.5 10*3/uL (ref 1.4–7.0)
Neutrophils: 50 %
Platelets: 290 10*3/uL (ref 150–450)
RBC: 3.98 x10E6/uL (ref 3.77–5.28)
RDW: 16 % — ABNORMAL HIGH (ref 12.3–15.4)
WBC: 5 10*3/uL (ref 3.4–10.8)

## 2018-08-16 LAB — FSH/LH
FSH: 123.3 m[IU]/mL
LH: 62 m[IU]/mL

## 2018-08-16 LAB — THYROID PANEL WITH TSH
Free Thyroxine Index: 2.1 (ref 1.2–4.9)
T3 Uptake Ratio: 29 % (ref 24–39)
T4, Total: 7.1 ug/dL (ref 4.5–12.0)
TSH: 0.138 u[IU]/mL — ABNORMAL LOW (ref 0.450–4.500)

## 2018-08-16 LAB — T4, FREE: Free T4: 1.38 ng/dL (ref 0.82–1.77)

## 2018-08-23 ENCOUNTER — Encounter: Payer: Medicare HMO | Admitting: Family Medicine

## 2018-09-16 ENCOUNTER — Encounter: Payer: Self-pay | Admitting: Nurse Practitioner

## 2018-09-16 ENCOUNTER — Ambulatory Visit (INDEPENDENT_AMBULATORY_CARE_PROVIDER_SITE_OTHER): Payer: Medicare HMO

## 2018-09-16 ENCOUNTER — Ambulatory Visit (INDEPENDENT_AMBULATORY_CARE_PROVIDER_SITE_OTHER): Payer: Medicare HMO | Admitting: Nurse Practitioner

## 2018-09-16 VITALS — BP 175/92 | HR 97 | Temp 97.3°F | Ht 66.0 in | Wt 170.0 lb

## 2018-09-16 DIAGNOSIS — Z23 Encounter for immunization: Secondary | ICD-10-CM | POA: Diagnosis not present

## 2018-09-16 DIAGNOSIS — R300 Dysuria: Secondary | ICD-10-CM | POA: Diagnosis not present

## 2018-09-16 DIAGNOSIS — N3000 Acute cystitis without hematuria: Secondary | ICD-10-CM | POA: Diagnosis not present

## 2018-09-16 LAB — URINALYSIS, COMPLETE
Bilirubin, UA: NEGATIVE
Glucose, UA: NEGATIVE
Ketones, UA: NEGATIVE
Nitrite, UA: NEGATIVE
Protein, UA: NEGATIVE
Specific Gravity, UA: 1.01 (ref 1.005–1.030)
Urobilinogen, Ur: 1 mg/dL (ref 0.2–1.0)
pH, UA: 6.5 (ref 5.0–7.5)

## 2018-09-16 LAB — MICROSCOPIC EXAMINATION: WBC, UA: >30 /hpf — AB (ref 0–5)

## 2018-09-16 MED ORDER — DOXYCYCLINE HYCLATE 100 MG PO TABS: 100.0000 mg | ORAL_TABLET | Freq: Two times a day (BID) | ORAL | 0 refills | Status: DC

## 2018-09-16 NOTE — Progress Notes (Signed)
   Subjective:    Patient ID: [name], female    DOB: 1946/07/28, 72 y.o.   MRN: [id number]   Chief Complaint: Dysuria and Urinary Frequency   HPI Patient came in for flu shot and was c/o dysuria and frequency. She said that this started a week ago. Has gotten worse.    Review of Systems  Constitutional: Negative.   HENT: Negative.   Respiratory: Negative.   Cardiovascular: Negative.   Gastrointestinal: Positive for nausea.  Genitourinary: Positive for dysuria, frequency and urgency.  Neurological: Negative.   Psychiatric/Behavioral: Negative.   All other systems reviewed and are negative.      Objective:   Physical Exam  Constitutional: She is oriented to person, place, and time. She appears well-developed and well-nourished.  Cardiovascular: Normal rate.  Pulmonary/Chest: Effort normal.  Abdominal: Soft. Bowel sounds are normal. There is no tenderness.  Genitourinary:  Genitourinary Comments: No cva tenderness  Neurological: She is alert and oriented to person, place, and time.  Skin: Skin is warm.  Psychiatric: She has a normal mood and affect. Her behavior is normal. Thought content normal.   BP (!) 175/92   Pulse 97   Temp (!) 97.3 F (36.3 C) (Oral)   Ht 5\' 6"  (1.676 m)   Wt 170 lb (77.1 kg)   LMP 08/21/2013   BMI 27.44 kg/m   UA- leuks 2+ WBC >30       Assessment & Plan:  [name] in today with chief complaint of Dysuria and Urinary Frequency   1. Dysuria - Urinalysis, Complete  2. Acute cystitis without hematuria Take medication as prescribe Cotton underwear Take shower not bath Cranberry juice, yogurt Force fluids AZO over the counter X2 days Culture pending RTO prn  - doxycycline (VIBRA-TABS) 100 MG tablet; Take 1 tablet (100 mg total) by mouth 2 (two) times daily. 1 po bid  Dispense: 20 tablet; Refill: 0 - Urine Culture  Mary-Margaret Hassell Done, FNP

## 2018-09-16 NOTE — Patient Instructions (Signed)
Take medication as prescribe Cotton underwear Take shower not bath Cranberry juice, yogurt Force fluids AZO over the counter X2 days Culture pending RTO prn  

## 2018-09-19 LAB — URINE CULTURE

## 2018-09-25 ENCOUNTER — Ambulatory Visit (INDEPENDENT_AMBULATORY_CARE_PROVIDER_SITE_OTHER): Payer: Medicare HMO | Admitting: Family Medicine

## 2018-09-25 ENCOUNTER — Encounter: Payer: Self-pay | Admitting: Family Medicine

## 2018-09-25 VITALS — BP 164/82 | HR 69 | Temp 98.7°F | Ht 66.0 in | Wt 167.0 lb

## 2018-09-25 DIAGNOSIS — Z0000 Encounter for general adult medical examination without abnormal findings: Secondary | ICD-10-CM | POA: Diagnosis not present

## 2018-09-25 NOTE — Progress Notes (Signed)
Subjective:  Patient ID: [name], female    DOB: Sep 26, 1946  Age: 72 y.o. MRN: [id number]  CC: Annual Exam   HPI [name] presents for Annual physical exam  Depression screen [facility] 2/9 09/25/2018 09/16/2018 08/15/2018  Decreased Interest 0 0 0  Down, Depressed, Hopeless 0 0 0  PHQ - 2 Score 0 0 0    History [name] has a past medical history of Alcoholism ([place]) (01/09/2012), Anemia, Anxiety, B12 deficiency, Carotid artery narrowing (10/03/2012), Chronic kidney disease (CKD), stage II (mild), COPD (chronic obstructive pulmonary disease) ([place]), Depression, Headache(784.0), Hyperlipidemia, Hypertension, Hyponatremia (01/09/2012), Hypothyroid, Menopause, Mini stroke ([place]), Orthostatic syncope (10/02/2012), Pneumonia, SBO (small bowel obstruction) ([place]) (06/28/2016), Shortness of breath, and Stroke ([place]) (2010).   She has a past surgical history that includes Appendectomy; Tonsillectomy; Cataract extraction w/PHACO (06/20/2012); Cataract extraction w/PHACO (07/11/2012); Colonoscopy (N/A, 12/17/2015); Esophagogastroduodenoscopy (N/A, 12/17/2015); Agile capsule (N/A, 02/22/2016); Knee arthroscopy (Right, 09/2016); and Lumbar laminectomy/decompression microdiscectomy (N/A, 05/25/2017).   Her family history includes GER disease in her mother; Hypertension in her maternal grandmother; Ulcers in her mother.She reports that she quit smoking about 26 years ago. Her smoking use included cigarettes. She has a 60.00 pack-year smoking history. She has never used smokeless tobacco. She reports that she does not drink alcohol or use drugs.    ROS Review of Systems  Constitutional: Negative.  Negative for appetite change, chills, diaphoresis, fatigue, fever and unexpected weight change.  HENT: Negative for congestion, ear pain, hearing loss, postnasal drip, rhinorrhea, sneezing, sore throat and trouble swallowing.   Eyes: Negative for pain and visual disturbance.  Respiratory: Negative for cough, chest  tightness and shortness of breath.   Cardiovascular: Negative for chest pain and palpitations.  Gastrointestinal: Negative for abdominal pain, constipation, diarrhea, nausea and vomiting.  Endocrine: Negative for cold intolerance, heat intolerance, polydipsia, polyphagia and polyuria.  Genitourinary: Negative for difficulty urinating, dysuria, frequency and menstrual problem.  Musculoskeletal: Negative for arthralgias, joint swelling and myalgias.  Skin: Negative for rash.  Allergic/Immunologic: Negative for environmental allergies.  Neurological: Negative for dizziness, weakness, numbness and headaches.  Psychiatric/Behavioral: Negative for agitation, dysphoric mood and sleep disturbance.    Objective:  BP (!) 164/82   Pulse 69   Temp 98.7 F (37.1 C) (Oral)   Ht 5\' 6"  (1.676 m)   Wt 167 lb (75.8 kg)   LMP 08/21/2013   BMI 26.95 kg/m   BP Readings from Last 3 Encounters:  09/25/18 (!) 164/82  09/16/18 (!) 175/92  08/15/18 (!) 141/76    Wt Readings from Last 3 Encounters:  09/25/18 167 lb (75.8 kg)  09/16/18 170 lb (77.1 kg)  08/15/18 166 lb (75.3 kg)     Physical Exam  Constitutional: She is oriented to person, place, and time. She appears well-developed and well-nourished. No distress.  HENT:  Head: Normocephalic and atraumatic.  Right Ear: External ear normal.  Left Ear: External ear normal.  Nose: Nose normal.  Mouth/Throat: Oropharynx is clear and moist.  Eyes: Pupils are equal, round, and reactive to light. Conjunctivae and EOM are normal.  Neck: Normal range of motion. Neck supple. No thyromegaly present.  Cardiovascular: Normal rate, regular rhythm and normal heart sounds.  No murmur heard. Pulmonary/Chest: Effort normal and breath sounds normal. No respiratory distress. She has no wheezes. She has no rales. Right breast exhibits no inverted nipple, no mass and no tenderness. Left breast exhibits no inverted nipple, no mass and no tenderness. Breasts are  symmetrical.  Abdominal: Soft. Normal  appearance and bowel sounds are normal. She exhibits no distension, no abdominal bruit and no mass. There is no splenomegaly or hepatomegaly. There is no tenderness. There is no tenderness at McBurney's point and negative Murphy's sign.  Musculoskeletal: Normal range of motion. She exhibits no edema or tenderness.  Lymphadenopathy:    She has no cervical adenopathy.  Neurological: She is alert and oriented to person, place, and time. She has normal reflexes.  Skin: Skin is warm and dry. No rash noted.  Psychiatric: She has a normal mood and affect. Her behavior is normal. Judgment and thought content normal.      Assessment & Plan:   [name] was seen today for annual exam.  Diagnoses and all orders for this visit:  Well adult exam       I am having [name] maintain her aspirin EC, thiamine, acetaminophen, Co H-37, folic acid, liothyronine, rosuvastatin, olmesartan, levothyroxine, alendronate, DULoxetine, and doxycycline.  Allergies as of 09/25/2018      Reactions   Ciprofloxacin Other (See Comments)   Caused pt to have delusional thoughts and hallucinations   Procardia [nifedipine] Swelling   Edema    Norvasc [amlodipine Besylate] Hives      Medication List        Accurate as of 09/25/18 11:59 PM. Always use your most recent med list.          acetaminophen 500 MG tablet Commonly known as:  TYLENOL Take 1 tablet (500 mg total) by mouth 2 (two) times daily. Pain   alendronate 70 MG tablet Commonly known as:  FOSAMAX Take 1 tablet (70 mg total) by mouth once a week. Take with a full glass of water on an empty stomach.   aspirin EC 81 MG tablet Take 81 mg by mouth daily.   Co Q-10 100 MG Caps Take 100 mg by mouth daily.   doxycycline 100 MG tablet Commonly known as:  VIBRA-TABS Take 1 tablet (100 mg total) by mouth 2 (two) times daily. 1 po bid   DULoxetine 30 MG capsule Commonly known as:  CYMBALTA Take I  capsule daily   folic acid 169 MCG tablet Commonly known as:  FOLVITE Take 2,000 mcg by mouth daily.   levothyroxine 112 MCG tablet Commonly known as:  SYNTHROID, LEVOTHROID Take 1 tablet (112 mcg total) by mouth daily.   liothyronine 5 MCG tablet Commonly known as:  CYTOMEL Take 1/2 tab (2.5) daily   olmesartan 40 MG tablet Commonly known as:  BENICAR Take 1 tablet (40 mg total) by mouth daily.   rosuvastatin 10 MG tablet Commonly known as:  CRESTOR Take 1 tablet (10 mg total) by mouth daily.   thiamine 100 MG tablet Commonly known as:  VITAMIN B-1 Take 1 tablet (100 mg total) by mouth daily.        Follow-up: Return in about 3 months (around 12/26/2018).  Claretta Fraise, M.D.

## 2018-09-25 NOTE — Patient Instructions (Signed)
Taper off of cymbalta by going to one every other day for a couple of weeks. Then Discontinue it completely.   DASH Eating Plan DASH stands for "Dietary Approaches to Stop Hypertension." The DASH eating plan is a healthy eating plan that has been shown to reduce high blood pressure (hypertension). It may also reduce your risk for type 2 diabetes, heart disease, and stroke. The DASH eating plan may also help with weight loss. What are tips for following this plan? General guidelines  Avoid eating more than 2,300 mg (milligrams) of salt (sodium) a day. If you have hypertension, you may need to reduce your sodium intake to 1,500 mg a day.  Limit alcohol intake to no more than 1 drink a day for nonpregnant women and 2 drinks a day for men. One drink equals 12 oz of beer, 5 oz of wine, or 1 oz of hard liquor.  Work with your health care provider to maintain a healthy body weight or to lose weight. Ask what an ideal weight is for you.  Get at least 30 minutes of exercise that causes your heart to beat faster (aerobic exercise) most days of the week. Activities may include walking, swimming, or biking.  Work with your health care provider or diet and nutrition specialist (dietitian) to adjust your eating plan to your individual calorie needs. Reading food labels  Check food labels for the amount of sodium per serving. Choose foods with less than 5 percent of the Daily Value of sodium. Generally, foods with less than 300 mg of sodium per serving fit into this eating plan.  To find whole grains, look for the word "whole" as the first word in the ingredient list. Shopping  Buy products labeled as "low-sodium" or "no salt added."  Buy fresh foods. Avoid canned foods and premade or frozen meals. Cooking  Avoid adding salt when cooking. Use salt-free seasonings or herbs instead of table salt or sea salt. Check with your health care provider or pharmacist before using salt substitutes.  Do not fry  foods. Cook foods using healthy methods such as baking, boiling, grilling, and broiling instead.  Cook with heart-healthy oils, such as olive, canola, soybean, or sunflower oil. Meal planning   Eat a balanced diet that includes: ? 5 or more servings of fruits and vegetables each day. At each meal, try to fill half of your plate with fruits and vegetables. ? Up to 6-8 servings of whole grains each day. ? Less than 6 oz of lean meat, poultry, or fish each day. A 3-oz serving of meat is about the same size as a deck of cards. One egg equals 1 oz. ? 2 servings of low-fat dairy each day. ? A serving of nuts, seeds, or beans 5 times each week. ? Heart-healthy fats. Healthy fats called Omega-3 fatty acids are found in foods such as flaxseeds and coldwater fish, like sardines, salmon, and mackerel.  Limit how much you eat of the following: ? Canned or prepackaged foods. ? Food that is high in trans fat, such as fried foods. ? Food that is high in saturated fat, such as fatty meat. ? Sweets, desserts, sugary drinks, and other foods with added sugar. ? Full-fat dairy products.  Do not salt foods before eating.  Try to eat at least 2 vegetarian meals each week.  Eat more home-cooked food and less restaurant, buffet, and fast food.  When eating at a restaurant, ask that your food be prepared with less salt or no  salt, if possible. What foods are recommended? The items listed may not be a complete list. Talk with your dietitian about what dietary choices are best for you. Grains Whole-grain or whole-wheat bread. Whole-grain or whole-wheat pasta. Brown rice. [place]. Bulgur. Whole-grain and low-sodium cereals. Pita bread. Low-fat, low-sodium crackers. Whole-wheat flour tortillas. Vegetables Fresh or frozen vegetables (raw, steamed, roasted, or grilled). Low-sodium or reduced-sodium tomato and vegetable juice. Low-sodium or reduced-sodium tomato sauce and tomato paste. Low-sodium or  reduced-sodium canned vegetables. Fruits All fresh, dried, or frozen fruit. Canned fruit in natural juice (without added sugar). Meat and other protein foods Skinless chicken or [place]. Ground chicken or [place]. Pork with fat trimmed off. Fish and seafood. Egg whites. Dried beans, peas, or lentils. Unsalted nuts, nut butters, and seeds. Unsalted canned beans. Lean cuts of beef with fat trimmed off. Low-sodium, lean deli meat. Dairy Low-fat (1%) or fat-free (skim) milk. Fat-free, low-fat, or reduced-fat cheeses. Nonfat, low-sodium ricotta or cottage cheese. Low-fat or nonfat yogurt. Low-fat, low-sodium cheese. Fats and oils Soft margarine without trans fats. Vegetable oil. Low-fat, reduced-fat, or light mayonnaise and salad dressings (reduced-sodium). Canola, safflower, olive, soybean, and sunflower oils. Avocado. Seasoning and other foods Herbs. Spices. Seasoning mixes without salt. Unsalted popcorn and pretzels. Fat-free sweets. What foods are not recommended? The items listed may not be a complete list. Talk with your dietitian about what dietary choices are best for you. Grains Baked goods made with fat, such as croissants, muffins, or some breads. Dry pasta or rice meal packs. Vegetables Creamed or fried vegetables. Vegetables in a cheese sauce. Regular canned vegetables (not low-sodium or reduced-sodium). Regular canned tomato sauce and paste (not low-sodium or reduced-sodium). Regular tomato and vegetable juice (not low-sodium or reduced-sodium). Angie Fava. Olives. Fruits Canned fruit in a light or heavy syrup. Fried fruit. Fruit in cream or butter sauce. Meat and other protein foods Fatty cuts of meat. Ribs. Fried meat. Berniece Salines. Sausage. Bologna and other processed lunch meats. Salami. Fatback. Hotdogs. Bratwurst. Salted nuts and seeds. Canned beans with added salt. Canned or smoked fish. Whole eggs or egg yolks. Chicken or [place] with skin. Dairy Whole or 2% milk, cream, and half-and-half.  Whole or full-fat cream cheese. Whole-fat or sweetened yogurt. Full-fat cheese. Nondairy creamers. Whipped toppings. Processed cheese and cheese spreads. Fats and oils Butter. Stick margarine. Lard. Shortening. Ghee. Bacon fat. Tropical oils, such as coconut, palm kernel, or palm oil. Seasoning and other foods Salted popcorn and pretzels. Onion salt, garlic salt, seasoned salt, table salt, and sea salt. Worcestershire sauce. Tartar sauce. Barbecue sauce. Teriyaki sauce. Soy sauce, including reduced-sodium. Steak sauce. Canned and packaged gravies. Fish sauce. Oyster sauce. Cocktail sauce. Horseradish that you find on the shelf. Ketchup. Mustard. Meat flavorings and tenderizers. Bouillon cubes. Hot sauce and Tabasco sauce. Premade or packaged marinades. Premade or packaged taco seasonings. Relishes. Regular salad dressings. Where to find more information:  National Heart, Lung, and [place]: https://wilson-eaton.com/  American Heart Association: www.heart.org Summary  The DASH eating plan is a healthy eating plan that has been shown to reduce high blood pressure (hypertension). It may also reduce your risk for type 2 diabetes, heart disease, and stroke.  With the DASH eating plan, you should limit salt (sodium) intake to 2,300 mg a day. If you have hypertension, you may need to reduce your sodium intake to 1,500 mg a day.  When on the DASH eating plan, aim to eat more fresh fruits and vegetables, whole grains, lean proteins, low-fat dairy, and heart-healthy  fats.  Work with your health care provider or diet and nutrition specialist (dietitian) to adjust your eating plan to your individual calorie needs. This information is not intended to replace advice given to you by your health care provider. Make sure you discuss any questions you have with your health care provider. Document Released: 11/16/2011 Document Revised: 11/20/2016 Document Reviewed: 11/20/2016 Elsevier Interactive Patient Education   [employer].

## 2018-09-29 ENCOUNTER — Encounter: Payer: Self-pay | Admitting: Family Medicine

## 2018-10-31 ENCOUNTER — Telehealth: Payer: Self-pay | Admitting: Family Medicine

## 2018-10-31 MED ORDER — DULOXETINE HCL 30 MG PO CPEP: ORAL_CAPSULE | ORAL | 0 refills | Status: DC

## 2018-10-31 NOTE — Telephone Encounter (Signed)
Pt aware.

## 2018-10-31 NOTE — Telephone Encounter (Signed)
Okay to restart the Cymbalta.  I sent in #30 tabs.  Any worsening symptoms, if symptoms do not improve, let us know.  I do not see where the Cymbalta was decreased, most recent note from October looks like it was to be continued.   Will CC PCP on message.

## 2018-11-04 ENCOUNTER — Ambulatory Visit: Payer: Medicare HMO | Admitting: Family Medicine

## 2018-11-05 DIAGNOSIS — Z1231 Encounter for screening mammogram for malignant neoplasm of breast: Secondary | ICD-10-CM | POA: Diagnosis not present

## 2018-11-05 LAB — HM MAMMOGRAPHY

## 2018-11-15 ENCOUNTER — Telehealth: Payer: Self-pay | Admitting: Family Medicine

## 2018-12-12 ENCOUNTER — Telehealth: Payer: Self-pay | Admitting: Family Medicine

## 2018-12-12 NOTE — Telephone Encounter (Signed)
Pt aware she ntbs and advised we don't have any openings today but to call back first thing in the morning to see if we have any openings. Pt states she will call in the morning.

## 2018-12-13 ENCOUNTER — Ambulatory Visit (INDEPENDENT_AMBULATORY_CARE_PROVIDER_SITE_OTHER): Payer: Medicare HMO | Admitting: Family Medicine

## 2018-12-13 ENCOUNTER — Encounter: Payer: Self-pay | Admitting: Family Medicine

## 2018-12-13 VITALS — BP 148/88 | HR 101 | Temp 99.5°F | Ht 66.0 in | Wt 166.0 lb

## 2018-12-13 DIAGNOSIS — R300 Dysuria: Secondary | ICD-10-CM | POA: Diagnosis not present

## 2018-12-13 DIAGNOSIS — N3001 Acute cystitis with hematuria: Secondary | ICD-10-CM

## 2018-12-13 LAB — URINALYSIS, COMPLETE
Bilirubin, UA: NEGATIVE
Glucose, UA: NEGATIVE
Ketones, UA: NEGATIVE
Nitrite, UA: POSITIVE — AB
Protein, UA: NEGATIVE
Specific Gravity, UA: 1.02 (ref 1.005–1.030)
Urobilinogen, Ur: 1 mg/dL (ref 0.2–1.0)
pH, UA: 6.5 (ref 5.0–7.5)

## 2018-12-13 LAB — MICROSCOPIC EXAMINATION
RBC, UA: NONE SEEN /hpf (ref 0–2)
WBC, UA: >30 /hpf — ABNORMAL HIGH (ref 0–5)

## 2018-12-13 MED ORDER — AMOXICILLIN-POT CLAVULANATE 875-125 MG PO TABS: 1.0000 | ORAL_TABLET | Freq: Two times a day (BID) | ORAL | 0 refills | Status: AC

## 2018-12-13 NOTE — Patient Instructions (Signed)

## 2018-12-13 NOTE — Progress Notes (Signed)
UXN:ATFTDD, Cletus Gash, MD Chief Complaint  Patient presents with  . Urinary Tract Infection    cloudy urine, dysuria; taking Azo    Current Issues:  Presents with 4 days of dysuria, urinary urgency and urinary frequency Associated symptoms include:  chills, cloudy urine and lower abdominal pain. Has used over the counter AZO with relief of symptoms.   There is a previous history of of similar symptoms. Sexually active:  No   No concern for STI.  Prior to Admission medications   Medication Sig Start Date End Date Taking? Authorizing Provider  acetaminophen (TYLENOL) 500 MG tablet Take 1 tablet (500 mg total) by mouth 2 (two) times daily. Pain 07/02/16  Yes Rexene Alberts, MD  alendronate (FOSAMAX) 70 MG tablet Take 1 tablet (70 mg total) by mouth once a week. Take with a full glass of water on an empty stomach. 08/15/18  Yes Claretta Fraise, MD  aspirin EC 81 MG tablet Take 81 mg by mouth daily.   Yes [provider]  Coenzyme Q10 (CO Q-10) 100 MG CAPS Take 100 mg by mouth daily. 11/15/16  Yes Claretta Fraise, MD  DULoxetine (CYMBALTA) 30 MG capsule Take I capsule daily 10/31/18  Yes Eustaquio Maize, MD  folic acid (FOLVITE) 220 MCG tablet Take 2,000 mcg by mouth daily.   Yes [provider]  levothyroxine (SYNTHROID, LEVOTHROID) 112 MCG tablet Take 1 tablet (112 mcg total) by mouth daily. 08/15/18  Yes Stacks, Cletus Gash, MD  liothyronine (CYTOMEL) 5 MCG tablet Take 1/2 tab (2.5) daily 08/15/18  Yes Stacks, Cletus Gash, MD  olmesartan (BENICAR) 40 MG tablet Take 1 tablet (40 mg total) by mouth daily. 08/15/18  Yes Claretta Fraise, MD  rosuvastatin (CRESTOR) 10 MG tablet Take 1 tablet (10 mg total) by mouth daily. 08/15/18  Yes Claretta Fraise, MD  thiamine (VITAMIN B-1) 100 MG tablet Take 1 tablet (100 mg total) by mouth daily. 04/20/16  Yes Claretta Fraise, MD  amoxicillin-clavulanate (AUGMENTIN) 875-125 MG tablet Take 1 tablet by mouth 2 (two) times daily for 7 days. 12/13/18 12/20/18  Baruch Gouty, FNP    Review of Systems: Review of Systems  Constitutional: Positive for chills, fever and malaise/fatigue.  Respiratory: Negative for cough and shortness of breath.   Cardiovascular: Negative for chest pain, palpitations and leg swelling.  Gastrointestinal: Positive for abdominal pain (lower abdominal pressure).  Genitourinary: Positive for dysuria, frequency and urgency. Negative for flank pain and hematuria.  Musculoskeletal: Negative for myalgias.  Neurological: Negative for dizziness, weakness and headaches.  All other systems reviewed and are negative.    PE:  BP (!) 148/88   Pulse (!) 101   Temp 99.5 F (37.5 C) (Oral)   Ht 5\' 6"  (1.676 m)   Wt 166 lb (75.3 kg)   LMP 08/21/2013   BMI 26.79 kg/m  Physical Exam Vitals signs and nursing note reviewed.  Constitutional:      General: She is not in acute distress.    Appearance: Normal appearance. She is well-developed and well-groomed. She is not ill-appearing or toxic-appearing.  HENT:     Head: Normocephalic and atraumatic.     Mouth/Throat:     Mouth: Mucous membranes are moist.     Pharynx: Oropharynx is clear.  Cardiovascular:     Rate and Rhythm: Normal rate and regular rhythm.     Heart sounds: No murmur. No friction rub. No gallop.   Pulmonary:     Effort: Pulmonary effort is normal.  Breath sounds: Normal breath sounds.  Abdominal:     General: Bowel sounds are normal. There is no distension.     Palpations: Abdomen is soft.     Tenderness: There is no abdominal tenderness. There is no right CVA tenderness or left CVA tenderness.  Skin:    General: Skin is warm and dry.     Capillary Refill: Capillary refill takes less than 2 seconds.  Neurological:     General: No focal deficit present.     Mental Status: She is alert and oriented to person, place, and time.  Psychiatric:        Mood and Affect: Mood normal.        Behavior: Behavior normal. Behavior is cooperative.        Thought  Content: Thought content normal.        Judgment: Judgment normal.      Results for orders placed or performed in visit on 11/12/18  HM MAMMOGRAPHY  Result Value Ref Range   HM Mammogram 0-4 Bi-Rad 0-4 Bi-Rad, Self Reported Normal   Urinalysis in office: many bacteria, 2+ leukocytes, positive nitrites, trace blood.   Malayjah was seen today for urinary tract infection.  Diagnoses and all orders for this visit:  Dysuria -     Urinalysis, Complete -     Urine culture  Acute cystitis with hematuria Increase water intake. Take showers instead of baths. Culture pending, will change antibiotic therapy if culture warrants it. Report any new or worsening symptoms. -     amoxicillin-clavulanate (AUGMENTIN) 875-125 MG tablet; Take 1 tablet by mouth 2 (two) times daily for 7 days. -     Urine culture  Return in 4 weeks (on 01/10/2019), or if symptoms worsen or fail to improve.   The above assessment and management plan was discussed with the patient. The patient verbalized understanding of and has agreed to the management plan. Patient is aware to call the clinic if symptoms fail to improve or worsen. Patient is aware when to return to the clinic for a follow-up visit. Patient educated on when it is appropriate to go to the emergency department.   Monia Pouch, FNP-C [facility] [address] [place], [place] [address] [phone]

## 2018-12-15 LAB — URINE CULTURE

## 2018-12-27 ENCOUNTER — Ambulatory Visit (INDEPENDENT_AMBULATORY_CARE_PROVIDER_SITE_OTHER): Payer: Medicare HMO | Admitting: Family Medicine

## 2018-12-27 ENCOUNTER — Encounter: Payer: Self-pay | Admitting: Family Medicine

## 2018-12-27 VITALS — BP 160/95 | HR 113 | Temp 97.1°F | Ht 66.0 in | Wt 163.0 lb

## 2018-12-27 DIAGNOSIS — R300 Dysuria: Secondary | ICD-10-CM | POA: Diagnosis not present

## 2018-12-27 DIAGNOSIS — N3090 Cystitis, unspecified without hematuria: Secondary | ICD-10-CM | POA: Diagnosis not present

## 2018-12-27 DIAGNOSIS — M1712 Unilateral primary osteoarthritis, left knee: Secondary | ICD-10-CM | POA: Diagnosis not present

## 2018-12-27 DIAGNOSIS — M545 Low back pain, unspecified: Secondary | ICD-10-CM

## 2018-12-27 LAB — URINALYSIS
Bilirubin, UA: NEGATIVE
Glucose, UA: NEGATIVE
Ketones, UA: NEGATIVE
Nitrite, UA: NEGATIVE
Protein, UA: NEGATIVE
Specific Gravity, UA: 1.01 (ref 1.005–1.030)
Urobilinogen, Ur: 1 mg/dL (ref 0.2–1.0)
pH, UA: 6.5 (ref 5.0–7.5)

## 2018-12-27 MED ORDER — LIOTHYRONINE SODIUM 5 MCG PO TABS: ORAL_TABLET | ORAL | 1 refills | Status: DC

## 2018-12-27 MED ORDER — SULFAMETHOXAZOLE-TRIMETHOPRIM 800-160 MG PO TABS: 1.0000 | ORAL_TABLET | Freq: Two times a day (BID) | ORAL | 0 refills | Status: DC

## 2018-12-27 MED ORDER — ALENDRONATE SODIUM 70 MG PO TABS: 70.0000 mg | ORAL_TABLET | ORAL | 1 refills | Status: DC

## 2018-12-27 MED ORDER — NABUMETONE 500 MG PO TABS: 1000.0000 mg | ORAL_TABLET | Freq: Two times a day (BID) | ORAL | 1 refills | Status: DC

## 2018-12-27 MED ORDER — OLMESARTAN MEDOXOMIL 40 MG PO TABS: 40.0000 mg | ORAL_TABLET | Freq: Every day | ORAL | 1 refills | Status: DC

## 2018-12-27 MED ORDER — ROSUVASTATIN CALCIUM 10 MG PO TABS: 10.0000 mg | ORAL_TABLET | Freq: Every day | ORAL | 1 refills | Status: DC

## 2018-12-27 MED ORDER — LEVOTHYROXINE SODIUM 112 MCG PO TABS: 112.0000 ug | ORAL_TABLET | Freq: Every day | ORAL | 1 refills | Status: DC

## 2018-12-27 MED ORDER — DULOXETINE HCL 60 MG PO CPEP: ORAL_CAPSULE | ORAL | 2 refills | Status: DC

## 2018-12-27 NOTE — Progress Notes (Signed)
Subjective:  Patient ID: [name], female    DOB: 11-26-1946  Age: 73 y.o. MRN: [id number]  CC: Medical Management of Chronic Issues (pt here today for routine follow up and also c/o dysuria )   HPI [name] presents for burning with urination and frequency with some leaking. No better since previous treatment two weeks ago. GEtting some sharp stabbing pain "just statanding there doing the dishes."  Denies fever . No flank pain. No nausea, vomiting. Denies need for routine follow up for thyroid at this time. Would rather wait until 6 mos suggested interval.   Left knee pain now severe. Wants to consider replacement. Shots haven't worked. BP usually good at home. Feels it is up today due to the severity of the knee pain. Had an arthroscopy and two rounds of P.T. plus steroid injections. Nothing has helped.  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   [name] has a past surgical history that includes Appendectomy; Tonsillectomy; Cataract extraction w/PHACO (06/20/2012); Cataract extraction w/PHACO (07/11/2012); Colonoscopy (N/A, 12/17/2015); Esophagogastroduodenoscopy (N/A, 12/17/2015); Agile capsule (N/A, 02/22/2016); Knee arthroscopy (Right, 09/2016); and Lumbar laminectomy/decompression microdiscectomy (N/A, 05/25/2017).   [name] family history includes GER disease in [name] mother; Hypertension in  [name] maternal grandmother; Ulcers in [name] mother.[name] reports that [name] quit smoking about 26 years ago. [name] smoking use included cigarettes. [name] has a 60.00 pack-year smoking history. [name] has never used smokeless tobacco. [name] reports that [name] does not drink alcohol or use drugs.    ROS Review of Systems  Constitutional: Negative.  Negative for chills, diaphoresis and fever.  HENT: Negative for congestion.   Eyes: Negative for visual disturbance.  Respiratory: Negative for cough and shortness of breath.   Cardiovascular: Negative for chest pain and palpitations.  Gastrointestinal: Negative for abdominal pain, constipation, diarrhea, nausea and vomiting.  Genitourinary: Positive for difficulty urinating, dysuria, frequency and urgency. Negative for decreased urine volume, flank pain, hematuria, menstrual problem and pelvic pain.  Musculoskeletal: Positive for arthralgias and myalgias. Negative for joint swelling.  Skin: Negative for rash.  Neurological: Negative.   Psychiatric/Behavioral: Negative for sleep disturbance.    Objective:  BP (!) 160/95   Pulse (!) 113   Temp (!) 97.1 F (36.2 C) (Oral)   Ht _0  (1.676 m)   Wt 163 lb (73.9 kg)   LMP 08/21/2013   BMI 26.31 kg/m   BP Readings from Last 3 Encounters:  12/27/18 (!) 160/95  12/13/18 (!) 148/88  09/25/18 (!) 164/82    Wt Readings from Last 3 Encounters:  12/27/18 163 lb (73.9 kg)  12/13/18 166 lb (75.3 kg)  09/25/18 167 lb (75.8 kg)     Physical Exam Constitutional:      General: [name] is not in acute distress.    Appearance: [name] is well-developed.  HENT:     Head: Normocephalic and atraumatic.     Right Ear: External ear normal.     Left Ear: External ear normal.     Nose: Nose normal.  Eyes:     Conjunctiva/sclera: Conjunctivae normal.     Pupils: Pupils are equal, round, and reactive to light.  Neck:     Musculoskeletal: Normal range of motion and neck supple.     Thyroid: No thyromegaly.  Cardiovascular:      Rate and Rhythm: Normal rate and regular rhythm.     Heart sounds: Normal heart sounds. No murmur.  Pulmonary:     Effort: Pulmonary effort is normal. No respiratory distress.     Breath sounds: Normal breath sounds. No wheezing or rales.  Abdominal:     Palpations: Abdomen is soft.     Tenderness: There is no abdominal tenderness.  Musculoskeletal: Normal range of motion.        General: Tenderness (left knee) present.  Lymphadenopathy:     Cervical: No cervical adenopathy.  Skin:    General: Skin is warm and dry.  Neurological:     Mental Status: [name] is alert and oriented to person, place, and time.     Deep Tendon Reflexes: Reflexes are normal and symmetric.  Psychiatric:        Behavior: Behavior normal.        Thought Content: Thought content normal.        Judgment: Judgment normal.       Assessment & Plan:   [name] was seen today for medical management of chronic issues.  Diagnoses and all orders for this visit:  Arthritis of left knee  Cystitis -     Urine Culture -     Urinalysis -     Cancel: CMP14+EGFR  Lumbar pain -     Cancel: CMP14+EGFR  Other orders -     Cancel: CBC with Differential/Platelet -     Cancel: TSH + free T4 -     Cancel: Vitamin B12 -     Cancel: Folate -     alendronate (FOSAMAX) 70 MG tablet; Take 1 tablet (70 mg total) by mouth once a week. Take with a full glass of water on an empty stomach. -     levothyroxine (SYNTHROID, LEVOTHROID) 112 MCG tablet; Take 1 tablet (112 mcg total) by mouth daily. -     liothyronine (CYTOMEL) 5 MCG tablet; Take 1/2 tab (2.5) daily -     olmesartan (BENICAR) 40 MG tablet; Take 1 tablet (40 mg total) by mouth daily. -     rosuvastatin (CRESTOR) 10 MG tablet; Take 1 tablet (10 mg total) by mouth daily. -     nabumetone (RELAFEN) 500 MG tablet; Take 2 tablets (1,000 mg total) by mouth 2 (two) times daily. For muscle and joint pain -     sulfamethoxazole-trimethoprim (BACTRIM DS,SEPTRA DS) 800-160 MG  tablet; Take 1 tablet by mouth 2 (two) times daily. -     DULoxetine (CYMBALTA) 60 MG capsule; Take I capsule daily       I have changed [name]. Price's DULoxetine. I am also having [name] start on nabumetone and sulfamethoxazole-trimethoprim. Additionally, I am having [name] maintain [name] aspirin EC, thiamine, acetaminophen, Co J-19, folic acid, alendronate, levothyroxine, liothyronine, olmesartan, and rosuvastatin.  Allergies as of 12/27/2018      Reactions   Ciprofloxacin Other (See Comments)   Caused pt to have delusional thoughts and hallucinations   Procardia [nifedipine] Swelling   Edema    Norvasc [amlodipine Besylate] Hives      Medication List       Accurate as of December 27, 2018 10:06 AM. Always use your most  recent med list.        acetaminophen 500 MG tablet Commonly known as:  TYLENOL Take 1 tablet (500 mg total) by mouth 2 (two) times daily. Pain   alendronate 70 MG tablet Commonly known as:  FOSAMAX Take 1 tablet (70 mg total) by mouth once a week. Take with a full glass of water on an empty stomach.   aspirin EC 81 MG tablet Take 81 mg by mouth daily.   Co Q-10 100 MG Caps Take 100 mg by mouth daily.   DULoxetine 60 MG capsule Commonly known as:  CYMBALTA Take I capsule daily   folic acid 737 MCG tablet Commonly known as:  FOLVITE Take 2,000 mcg by mouth daily.   levothyroxine 112 MCG tablet Commonly known as:  SYNTHROID, LEVOTHROID Take 1 tablet (112 mcg total) by mouth daily.   liothyronine 5 MCG tablet Commonly known as:  CYTOMEL Take 1/2 tab (2.5) daily   nabumetone 500 MG tablet Commonly known as:  RELAFEN Take 2 tablets (1,000 mg total) by mouth 2 (two) times daily. For muscle and joint pain   olmesartan 40 MG tablet Commonly known as:  BENICAR Take 1 tablet (40 mg total) by mouth daily.   rosuvastatin 10 MG tablet Commonly known as:  CRESTOR Take 1 tablet (10 mg total) by mouth daily.   sulfamethoxazole-trimethoprim 800-160 MG  tablet Commonly known as:  BACTRIM DS,SEPTRA DS Take 1 tablet by mouth 2 (two) times daily.   thiamine 100 MG tablet Commonly known as:  VITAMIN B-1 Take 1 tablet (100 mg total) by mouth daily.      Consider myrbetriq if sx continue after course of Bactrim  Follow-up: Return in about 3 months (around 03/28/2019).  Claretta Fraise, M.D.

## 2018-12-28 LAB — URINE CULTURE

## 2018-12-31 ENCOUNTER — Other Ambulatory Visit: Payer: Self-pay | Admitting: Family Medicine

## 2018-12-31 MED ORDER — ROSUVASTATIN CALCIUM 10 MG PO TABS: 10.0000 mg | ORAL_TABLET | Freq: Every day | ORAL | 1 refills | Status: DC

## 2018-12-31 MED ORDER — NABUMETONE 500 MG PO TABS: 1000.0000 mg | ORAL_TABLET | Freq: Two times a day (BID) | ORAL | 1 refills | Status: DC

## 2018-12-31 MED ORDER — LIOTHYRONINE SODIUM 5 MCG PO TABS: ORAL_TABLET | ORAL | 1 refills | Status: DC

## 2018-12-31 MED ORDER — ALENDRONATE SODIUM 70 MG PO TABS: 70.0000 mg | ORAL_TABLET | ORAL | 1 refills | Status: DC

## 2018-12-31 MED ORDER — LEVOTHYROXINE SODIUM 112 MCG PO TABS: 112.0000 ug | ORAL_TABLET | Freq: Every day | ORAL | 1 refills | Status: DC

## 2018-12-31 MED ORDER — OLMESARTAN MEDOXOMIL 40 MG PO TABS: 40.0000 mg | ORAL_TABLET | Freq: Every day | ORAL | 1 refills | Status: DC

## 2018-12-31 NOTE — Telephone Encounter (Signed)
Left message advising refills sent to [facility] and for her to call back to discuss pain medicine.

## 2018-12-31 NOTE — Telephone Encounter (Signed)
Pt called back and we discussed her medications and I advised her he started her on the Nabumetone for joint pain. Pt voiced understanding.

## 2019-03-31 ENCOUNTER — Encounter: Payer: Self-pay | Admitting: Family Medicine

## 2019-03-31 ENCOUNTER — Other Ambulatory Visit: Payer: Self-pay

## 2019-03-31 ENCOUNTER — Ambulatory Visit (INDEPENDENT_AMBULATORY_CARE_PROVIDER_SITE_OTHER): Payer: Medicare HMO | Admitting: Family Medicine

## 2019-03-31 DIAGNOSIS — E038 Other specified hypothyroidism: Secondary | ICD-10-CM

## 2019-03-31 DIAGNOSIS — I10 Essential (primary) hypertension: Secondary | ICD-10-CM | POA: Diagnosis not present

## 2019-03-31 DIAGNOSIS — M48062 Spinal stenosis, lumbar region with neurogenic claudication: Secondary | ICD-10-CM

## 2019-03-31 MED ORDER — DULOXETINE HCL 60 MG PO CPEP: ORAL_CAPSULE | ORAL | 2 refills | Status: DC

## 2019-03-31 NOTE — Progress Notes (Signed)
Subjective:    Patient ID: [name], female    DOB: 09-09-1946, 73 y.o.   MRN: [id number]  CC: No chief complaint on file.   HPI: [name] is a 74 y.o. female presenting for No chief complaint on file. Two episdoes of diarrhea with vomiting lasting 24 hours. Two occasions recently, just getting over one. Periumbilical soreness better, but yesterday couldn't drink water without it occurring.   Knee pain oon the left is moderately severe in spite of use of medication. Right seems worse too. Pt. Feels she is overcompensating leading to the right knee pain.  Does not want any further intervention at this time.  Shots seem to be getting less effective over time.  Considering knee replacement with orthopedics.   Follow-up of hypertension. Patient has no history of headache chest pain or shortness of breath or recent cough. Patient also denies symptoms of TIA such as numbness weakness lateralizing. Patient checks  blood pressure at home and has not had any elevated readings recently. Patient denies side effects from his medication. States taking it regularly. Patient presents for follow-up on  thyroid. The patient has a history of hypothyroidism for many years. It has been stable recently. Pt. denies any change in  voice, loss of hair, heat or cold intolerance. Energy level has been adequate to good. Patient denies constipation and diarrhea. No myxedema. Medication is as noted below. Verified that pt is taking it daily on an empty stomach. Well tolerated.  Patient in for follow-up of elevated cholesterol. Doing well without complaints on current medication. Denies side effects of statin including myalgia and arthralgia and nausea.  Currently no chest pain, shortness of breath or other cardiovascular related symptoms noted.  Depression screen [facility] 2/9 12/27/2018 12/13/2018 09/25/2018 09/16/2018 08/15/2018  Decreased Interest 1 1 0 0 0  Down, Depressed, Hopeless 0 0 0 0 0  PHQ - 2 Score 1 1 0 0 0      Relevant past medical, surgical, family and social history reviewed and updated as indicated.  Interim medical history since our last visit reviewed. Allergies and medications reviewed and updated.  ROS:  Review of Systems  Constitutional: Negative.   HENT: Negative for congestion.   Eyes: Negative for visual disturbance.  Respiratory: Negative for shortness of breath.   Cardiovascular: Negative for chest pain.  Gastrointestinal: Positive for diarrhea, nausea and vomiting. Negative for abdominal pain and constipation.  Genitourinary: Negative for difficulty urinating.  Musculoskeletal: Positive for arthralgias. Negative for myalgias.  Neurological: Negative for headaches.  Psychiatric/Behavioral: Negative for agitation, behavioral problems, decreased concentration, dysphoric mood and sleep disturbance. The patient is not nervous/anxious.      Social History   Tobacco Use  Smoking Status Former Smoker  . Packs/day: 2.00  . Years: 30.00  . Pack years: 60.00  . Types: Cigarettes  . Last attempt to quit: 06/17/1992  . Years since quitting: 26.8  Smokeless Tobacco Never Used       Objective:     Wt Readings from Last 3 Encounters:  12/27/18 163 lb (73.9 kg)  12/13/18 166 lb (75.3 kg)  09/25/18 167 lb (75.8 kg)     Exam deferred. Pt. Harboring due to COVID 19. Phone visit performed.   Assessment & Plan:    Diagnoses and all orders for this visit:  Other specified hypothyroidism  Spinal stenosis, lumbar region with neurogenic claudication  Essential hypertension  Other orders -     DULoxetine (CYMBALTA) 60 MG capsule; Take  I capsule daily  Patient is stable regarding her overall condition.  However she will let me know if she has any recurrence of her diarrhea or vomiting.  Will treat based on recurrence of symptoms.  Meanwhile for the knee she can continue the nabumetone if it seems to help some otherwise she will need to see orthopedics for definitive care.  Virtual Visit via telephone Note  I discussed the limitations, risks, security and privacy concerns of performing an evaluation and management service by telephone and the availability of in person appointments. The patient expressed understanding and agreed to proceed. Pt. Is at home. Dr. Livia Snellen is in his office.  Follow Up Instructions:   I discussed the assessment and treatment plan with the patient. The patient was provided an opportunity to ask questions and all were answered. The patient agreed with the plan and demonstrated an understanding of the instructions.   The patient was advised to call back or seek an in-person evaluation if the symptoms worsen or if the condition fails to improve as anticipated.  Visit started: 10:00 Call ended:  10:14 Total minutes including chart review and phone contact time: 28   Follow up plan: Return in about 3 months (around 06/30/2019).  Claretta Fraise, MD [facility] 03/31/2019, 1:49 PM

## 2019-04-21 ENCOUNTER — Observation Stay (HOSPITAL_COMMUNITY): Payer: Medicare HMO

## 2019-04-21 ENCOUNTER — Encounter (HOSPITAL_COMMUNITY): Payer: Self-pay

## 2019-04-21 ENCOUNTER — Ambulatory Visit (INDEPENDENT_AMBULATORY_CARE_PROVIDER_SITE_OTHER): Payer: Medicare HMO | Admitting: Family Medicine

## 2019-04-21 ENCOUNTER — Observation Stay (HOSPITAL_COMMUNITY)
Admission: EM | Admit: 2019-04-21 | Discharge: 2019-04-22 | Disposition: A | Discharge status: Home / Self Care | Payer: Medicare HMO | Attending: Internal Medicine | Admitting: Internal Medicine

## 2019-04-21 ENCOUNTER — Other Ambulatory Visit: Payer: Self-pay

## 2019-04-21 DIAGNOSIS — F329 Major depressive disorder, single episode, unspecified: Secondary | ICD-10-CM | POA: Diagnosis not present

## 2019-04-21 DIAGNOSIS — Z7982 Long term (current) use of aspirin: Secondary | ICD-10-CM | POA: Diagnosis not present

## 2019-04-21 DIAGNOSIS — Z1159 Encounter for screening for other viral diseases: Secondary | ICD-10-CM | POA: Diagnosis not present

## 2019-04-21 DIAGNOSIS — Z79899 Other long term (current) drug therapy: Secondary | ICD-10-CM | POA: Insufficient documentation

## 2019-04-21 DIAGNOSIS — R109 Unspecified abdominal pain: Secondary | ICD-10-CM | POA: Diagnosis not present

## 2019-04-21 DIAGNOSIS — Z03818 Encounter for observation for suspected exposure to other biological agents ruled out: Secondary | ICD-10-CM | POA: Diagnosis not present

## 2019-04-21 DIAGNOSIS — Z8673 Personal history of transient ischemic attack (TIA), and cerebral infarction without residual deficits: Secondary | ICD-10-CM | POA: Insufficient documentation

## 2019-04-21 DIAGNOSIS — I10 Essential (primary) hypertension: Secondary | ICD-10-CM | POA: Diagnosis not present

## 2019-04-21 DIAGNOSIS — Z87891 Personal history of nicotine dependence: Secondary | ICD-10-CM | POA: Diagnosis not present

## 2019-04-21 DIAGNOSIS — R110 Nausea: Secondary | ICD-10-CM | POA: Diagnosis present

## 2019-04-21 DIAGNOSIS — E039 Hypothyroidism, unspecified: Secondary | ICD-10-CM | POA: Insufficient documentation

## 2019-04-21 DIAGNOSIS — E871 Hypo-osmolality and hyponatremia: Principal | ICD-10-CM | POA: Diagnosis present

## 2019-04-21 DIAGNOSIS — N182 Chronic kidney disease, stage 2 (mild): Secondary | ICD-10-CM | POA: Insufficient documentation

## 2019-04-21 DIAGNOSIS — J449 Chronic obstructive pulmonary disease, unspecified: Secondary | ICD-10-CM | POA: Diagnosis not present

## 2019-04-21 DIAGNOSIS — A084 Viral intestinal infection, unspecified: Secondary | ICD-10-CM | POA: Insufficient documentation

## 2019-04-21 DIAGNOSIS — I129 Hypertensive chronic kidney disease with stage 1 through stage 4 chronic kidney disease, or unspecified chronic kidney disease: Secondary | ICD-10-CM | POA: Diagnosis not present

## 2019-04-21 DIAGNOSIS — F1020 Alcohol dependence, uncomplicated: Secondary | ICD-10-CM | POA: Insufficient documentation

## 2019-04-21 DIAGNOSIS — F419 Anxiety disorder, unspecified: Secondary | ICD-10-CM | POA: Insufficient documentation

## 2019-04-21 DIAGNOSIS — R112 Nausea with vomiting, unspecified: Secondary | ICD-10-CM | POA: Diagnosis not present

## 2019-04-21 DIAGNOSIS — E860 Dehydration: Secondary | ICD-10-CM

## 2019-04-21 LAB — CBC WITH DIFFERENTIAL/PLATELET
Abs Immature Granulocytes: 0.07 10*3/uL (ref 0.00–0.07)
Basophils Absolute: 0 10*3/uL (ref 0.0–0.1)
Basophils Relative: 0 %
Eosinophils Absolute: 0 10*3/uL (ref 0.0–0.5)
Eosinophils Relative: 0 %
HCT: 38.6 % (ref 36.0–46.0)
Hemoglobin: 12.9 g/dL (ref 12.0–15.0)
Immature Granulocytes: 1 %
Lymphocytes Relative: 20 %
Lymphs Abs: 1.7 10*3/uL (ref 0.7–4.0)
MCH: 30.5 pg (ref 26.0–34.0)
MCHC: 33.4 g/dL (ref 30.0–36.0)
MCV: 91.3 fL (ref 80.0–100.0)
Monocytes Absolute: 0.8 10*3/uL (ref 0.1–1.0)
Monocytes Relative: 10 %
Neutro Abs: 5.7 10*3/uL (ref 1.7–7.7)
Neutrophils Relative %: 69 %
Platelets: 278 10*3/uL (ref 150–400)
RBC: 4.23 MIL/uL (ref 3.87–5.11)
RDW: 13.5 % (ref 11.5–15.5)
WBC: 8.3 10*3/uL (ref 4.0–10.5)
nRBC: 0 % (ref 0.0–0.2)

## 2019-04-21 LAB — COMPREHENSIVE METABOLIC PANEL
ALT: 12 U/L (ref 0–44)
AST: 15 U/L (ref 15–41)
Albumin: 4.1 g/dL (ref 3.5–5.0)
Alkaline Phosphatase: 91 U/L (ref 38–126)
Anion gap: 12 (ref 5–15)
BUN: 17 mg/dL (ref 8–23)
CO2: 21 mmol/L — ABNORMAL LOW (ref 22–32)
Calcium: 9.3 mg/dL (ref 8.9–10.3)
Chloride: 93 mmol/L — ABNORMAL LOW (ref 98–111)
Creatinine, Ser: 0.98 mg/dL (ref 0.44–1.00)
GFR calc Af Amer: >60 mL/min (ref >60)
GFR calc non Af Amer: 58 mL/min — ABNORMAL LOW (ref >60)
Glucose, Bld: 97 mg/dL (ref 70–99)
Potassium: 3.8 mmol/L (ref 3.5–5.1)
Sodium: 126 mmol/L — ABNORMAL LOW (ref 135–145)
Total Bilirubin: 1.2 mg/dL (ref 0.3–1.2)
Total Protein: 7.6 g/dL (ref 6.5–8.1)

## 2019-04-21 LAB — ETHANOL: Alcohol, Ethyl (B): <10 mg/dL (ref <10)

## 2019-04-21 LAB — NA AND K (SODIUM & POTASSIUM), RAND UR
Potassium Urine: 11 mmol/L
Sodium, Ur: 25 mmol/L

## 2019-04-21 LAB — CORTISOL: Cortisol, Plasma: 6.3 ug/dL

## 2019-04-21 LAB — OSMOLALITY: Osmolality: 275 mOsm/kg (ref 275–295)

## 2019-04-21 LAB — URINALYSIS, ROUTINE W REFLEX MICROSCOPIC
Bilirubin Urine: NEGATIVE
Glucose, UA: NEGATIVE mg/dL
Hgb urine dipstick: NEGATIVE
Ketones, ur: NEGATIVE mg/dL
Nitrite: NEGATIVE
Protein, ur: NEGATIVE mg/dL
Specific Gravity, Urine: 1.004 — ABNORMAL LOW (ref 1.005–1.030)
pH: 6 (ref 5.0–8.0)

## 2019-04-21 LAB — RAPID URINE DRUG SCREEN, HOSP PERFORMED
Amphetamines: NOT DETECTED
Barbiturates: NOT DETECTED
Benzodiazepines: NOT DETECTED
Cocaine: NOT DETECTED
Opiates: NOT DETECTED
Tetrahydrocannabinol: NOT DETECTED

## 2019-04-21 LAB — CREATININE, URINE, RANDOM: Creatinine, Urine: 50.29 mg/dL

## 2019-04-21 LAB — PHOSPHORUS: Phosphorus: 2.5 mg/dL (ref 2.5–4.6)

## 2019-04-21 LAB — SARS CORONAVIRUS 2 BY RT PCR (HOSPITAL ORDER, PERFORMED IN ~~LOC~~ HOSPITAL LAB): SARS Coronavirus 2: NEGATIVE

## 2019-04-21 LAB — TROPONIN I
Troponin I: <0.03 ng/mL (ref <0.03)
Troponin I: <0.03 ng/mL (ref <0.03)

## 2019-04-21 LAB — LIPASE, BLOOD: Lipase: 29 U/L (ref 11–51)

## 2019-04-21 LAB — TSH: TSH: 21.058 u[IU]/mL — ABNORMAL HIGH (ref 0.350–4.500)

## 2019-04-21 LAB — OSMOLALITY, URINE: Osmolality, Ur: 193 mOsm/kg — ABNORMAL LOW (ref 300–900)

## 2019-04-21 LAB — MAGNESIUM: Magnesium: 1.9 mg/dL (ref 1.7–2.4)

## 2019-04-21 MED ORDER — ONDANSETRON HCL 4 MG/2ML IJ SOLN
4.0000 mg | Freq: Once | INTRAMUSCULAR | Status: AC
  Administered 2019-04-21: 4 mg via INTRAVENOUS
  Filled 2019-04-21: qty 2

## 2019-04-21 MED ORDER — SODIUM CHLORIDE 0.9 % IV BOLUS
1000.0000 mL | Freq: Once | INTRAVENOUS | Status: AC
  Administered 2019-04-21: 1000 mL via INTRAVENOUS

## 2019-04-21 MED ORDER — POLYETHYLENE GLYCOL 3350 17 G PO PACK: 17.0000 g | PACK | Freq: Every day | ORAL | Status: DC | PRN

## 2019-04-21 MED ORDER — ACETAMINOPHEN 650 MG RE SUPP: 650.0000 mg | Freq: Four times a day (QID) | RECTAL | Status: DC | PRN

## 2019-04-21 MED ORDER — MORPHINE SULFATE (PF) 2 MG/ML IV SOLN: 2.0000 mg | INTRAVENOUS | Status: DC | PRN

## 2019-04-21 MED ORDER — IRBESARTAN 150 MG PO TABS
300.0000 mg | ORAL_TABLET | Freq: Every day | ORAL | Status: DC
  Administered 2019-04-21 – 2019-04-22 (×2): 300 mg via ORAL
  Filled 2019-04-21 (×4): qty 2

## 2019-04-21 MED ORDER — ONDANSETRON HCL 4 MG/2ML IJ SOLN: 4.0000 mg | Freq: Four times a day (QID) | INTRAMUSCULAR | Status: DC | PRN

## 2019-04-21 MED ORDER — POTASSIUM CHLORIDE IN NACL 20-0.9 MEQ/L-% IV SOLN
INTRAVENOUS | Status: DC
  Administered 2019-04-21 – 2019-04-22 (×2): via INTRAVENOUS

## 2019-04-21 MED ORDER — FAMOTIDINE IN NACL 20-0.9 MG/50ML-% IV SOLN
20.0000 mg | Freq: Two times a day (BID) | INTRAVENOUS | Status: DC
  Administered 2019-04-21 – 2019-04-22 (×2): 20 mg via INTRAVENOUS
  Filled 2019-04-21 (×2): qty 50

## 2019-04-21 MED ORDER — ONDANSETRON HCL 4 MG PO TABS: 4.0000 mg | ORAL_TABLET | Freq: Four times a day (QID) | ORAL | Status: DC | PRN

## 2019-04-21 MED ORDER — ENOXAPARIN SODIUM 40 MG/0.4ML ~~LOC~~ SOLN
40.0000 mg | SUBCUTANEOUS | Status: DC
  Administered 2019-04-21: 40 mg via SUBCUTANEOUS
  Filled 2019-04-21: qty 0.4

## 2019-04-21 MED ORDER — ACETAMINOPHEN 325 MG PO TABS: 650.0000 mg | ORAL_TABLET | Freq: Four times a day (QID) | ORAL | Status: DC | PRN

## 2019-04-21 MED ORDER — LEVOTHYROXINE SODIUM 112 MCG PO TABS
112.0000 ug | ORAL_TABLET | Freq: Every day | ORAL | Status: DC
  Administered 2019-04-22: 06:00:00 112 ug via ORAL
  Filled 2019-04-21: qty 1

## 2019-04-21 MED ORDER — LABETALOL HCL 5 MG/ML IV SOLN: 10.0000 mg | INTRAVENOUS | Status: DC | PRN

## 2019-04-21 NOTE — ED Provider Notes (Addendum)
Assumed care in sign out from Dr Sabra Heck. 72yF with n/v for the past ~5d. My abdominal exam is also benign. W/u significant for hyponatremia. No obvious offending meds. Listed history of hyponatremia but labs back in the past year+ have been within normal range. Neuro exam is nonfocal. She says she hasn't felt confused per say but has had weird thoughts for the past two days. "Thinking that I was at work when I wasn't." "Thinking I had a new job." Additional lab/urine studies ordered. Probably from extrarenal losses from recent vomiting and poor PO intake.    CRITICAL CARE Performed by: Virgel Manifold Total critical care time: 30 minutes Hyponatremia requiring admission Critical care time was exclusive of separately billable procedures and treating other patients. Critical care was necessary to treat or prevent imminent or life-threatening deterioration. Critical care was time spent personally by me on the following activities: development of treatment plan with patient and/or surrogate as well as nursing, discussions with consultants, evaluation of patient's response to treatment, examination of patient, obtaining history from patient or surrogate, ordering and performing treatments and interventions, ordering and review of laboratory studies, ordering and review of radiographic studies, pulse oximetry and re-evaluation of patient's condition.     Virgel Manifold, MD 04/21/19 [phone]

## 2019-04-21 NOTE — ED Provider Notes (Signed)
[facility] EMERGENCY DEPARTMENT Provider Note   CSN: [id number] Arrival date & time: 04/21/19  1348    History   Chief Complaint Chief Complaint  Patient presents with  . Emesis    HPI [name] is a 73 y.o. female.      Emesis  Associated symptoms: no abdominal pain, no chills, no cough, no diarrhea, no fever, no headaches, no myalgias and no sore throat    The patient is a very pleasant 73 year old female, she does have a medical history significant for some vascular disease, some chronic kidney disease, some anxiety alcoholism and B12 deficiency.  She also has a history of hyponatremia.  She presents to the hospital today with a complaint of nausea.  This nausea started approximately 5 days ago and was originally associated with multiple episodes of vomiting.  There was no episodes of diarrhea.  Each day this seems to improve, 2 days ago she had several episodes of vomiting, yesterday was one episode and today she has not vomited however when she talked to her family doctor today she was directed immediately to the emergency department.  There is no abdominal pain fevers chills coughing or shortness of breath.  She has had some difficulty urinating because she does not have much in volume and feels dehydrated.  No medications given prior to arrival.  He denies any travel or sick contacts.  She really has no abdominal pain other than the epigastrium when she retches.   Past Medical History:  Diagnosis Date  . Alcoholism ([place]) 01/09/2012  . Anemia   . Anxiety   . B12 deficiency   . Carotid artery narrowing 10/03/2012   On the right.  . Chronic kidney disease (CKD), stage II (mild)   . COPD (chronic obstructive pulmonary disease) ([place])   . Depression   . Headache(784.0)   . Hyperlipidemia   . Hypertension   . Hyponatremia 01/09/2012  . Hypothyroid   . Menopause   . Mini stroke ([place])   . Orthostatic syncope 10/02/2012  . Pneumonia   . SBO (small bowel obstruction)  ([place]) 06/28/2016  . Shortness of breath   . Stroke [facility]) 2010   no deficits    Patient Active Problem List   Diagnosis Date Noted  . Spinal stenosis, lumbar region with neurogenic claudication 05/25/2017  . Sinus tachycardia 06/29/2016  . Transaminitis 03/10/2016  . Pelvic mass 01/27/2016  . Colonic ulcer   . Diverticulosis of colon without hemorrhage   . Iron deficiency anemia due to chronic blood loss   . Lumbar pain 12/14/2015  . Chronic kidney disease (CKD), stage II (mild) 10/03/2012  . Carotid artery narrowing 10/03/2012  . Hypothyroidism 10/02/2012  . Essential hypertension 01/09/2012  . Hyperlipidemia 01/09/2012  . B12 deficiency 01/09/2012    Past Surgical History:  Procedure Laterality Date  . AGILE CAPSULE N/A 02/22/2016   dummy capsule remained in the distal ileum  . APPENDECTOMY    . CATARACT EXTRACTION W/PHACO  06/20/2012   Procedure: CATARACT EXTRACTION PHACO AND INTRAOCULAR LENS PLACEMENT (IOC);  Surgeon: Tonny Branch, MD;  Location: [facility];  Service: Ophthalmology;  Laterality: Right;  CDE:10.66  . CATARACT EXTRACTION W/PHACO  07/11/2012   Procedure: CATARACT EXTRACTION PHACO AND INTRAOCULAR LENS PLACEMENT (IOC);  Surgeon: Tonny Branch, MD;  Location: [facility];  Service: Ophthalmology;  Laterality: Left;  CDE: 12.69  . COLONOSCOPY N/A 12/17/2015   RMR: Marked circumferential ulceration of the ascending colon/Ileocecal valve ulceration with biopsy most consistent with NSAID injury  versus inflammatory bowel disease, pancolonoc diverticulosis redundant colon  . ESOPHAGOGASTRODUODENOSCOPY N/A 12/17/2015   PYK:[id number] gastric polyp, biopsy benign  . KNEE ARTHROSCOPY Right 09/2016  . LUMBAR LAMINECTOMY/DECOMPRESSION MICRODISCECTOMY N/A 05/25/2017   Procedure: CENTRAL DECOMPRESSIVE LUMBAR LAMINECTOMY FOR SPINAL STENOSIS L3-L4, FORAMINOTOMY FOR THE L4 ROOT AND L5 ROOT;  Surgeon: Latanya Maudlin, MD;  Location: [facility];  Service: Orthopedics;  Laterality: N/A;  . TONSILLECTOMY        OB History   No obstetric history on file.      Home Medications    Prior to Admission medications   Medication Sig Start Date End Date Taking? Authorizing Provider  acetaminophen (TYLENOL) 500 MG tablet Take 1 tablet (500 mg total) by mouth 2 (two) times daily. Pain 07/02/16   Rexene Alberts, MD  aspirin EC 81 MG tablet Take 81 mg by mouth daily.    [provider]  Coenzyme Q10 (CO Q-10) 100 MG CAPS Take 100 mg by mouth daily. 11/15/16   Claretta Fraise, MD  DULoxetine (CYMBALTA) 60 MG capsule Take I capsule daily 03/31/19   Claretta Fraise, MD  folic acid (FOLVITE) 539 MCG tablet Take 2,000 mcg by mouth daily.    [provider]  levothyroxine (SYNTHROID, LEVOTHROID) 112 MCG tablet Take 1 tablet (112 mcg total) by mouth daily. 12/31/18   Claretta Fraise, MD  liothyronine (CYTOMEL) 5 MCG tablet Take 1/2 tab (2.5) daily 12/31/18   Claretta Fraise, MD  nabumetone (RELAFEN) 500 MG tablet Take 2 tablets (1,000 mg total) by mouth 2 (two) times daily. For muscle and joint pain 12/31/18   Claretta Fraise, MD  olmesartan (BENICAR) 40 MG tablet Take 1 tablet (40 mg total) by mouth daily. 12/31/18   Claretta Fraise, MD  rosuvastatin (CRESTOR) 10 MG tablet Take 1 tablet (10 mg total) by mouth daily. 12/31/18   Claretta Fraise, MD  thiamine (VITAMIN B-1) 100 MG tablet Take 1 tablet (100 mg total) by mouth daily. 04/20/16   Claretta Fraise, MD    Family History Family History  Problem Relation Age of Onset  . GER disease Mother   . Ulcers Mother   . Hypertension Maternal Grandmother     Social History Social History   Tobacco Use  . Smoking status: Former Smoker    Packs/day: 2.00    Years: 30.00    Pack years: 60.00    Types: Cigarettes    Last attempt to quit: 06/17/1992    Years since quitting: 26.8  . Smokeless tobacco: Never Used  Substance Use Topics  . Alcohol use: No    Alcohol/week: 14.0 standard drinks    Types: 14 Standard drinks or equivalent per week  . Drug use: No      Allergies   Ciprofloxacin; Procardia [nifedipine]; and Norvasc [amlodipine besylate]   Review of Systems Review of Systems  Constitutional: Negative for chills, diaphoresis, fatigue and fever.  HENT: Negative for congestion and sore throat.   Eyes: Negative for discharge and redness.  Respiratory: Negative for cough, chest tightness, shortness of breath and wheezing.   Cardiovascular: Negative for chest pain and leg swelling.  Gastrointestinal: Positive for nausea and vomiting. Negative for abdominal distention, abdominal pain and diarrhea.  Endocrine: Negative for polydipsia, polyphagia and polyuria.  Genitourinary: Positive for decreased urine volume. Negative for dysuria.  Musculoskeletal: Negative for back pain, joint swelling, myalgias, neck pain and neck stiffness.  Skin: Negative for rash and wound.  Neurological: Negative for weakness, numbness and headaches.  Hematological: Negative for adenopathy. Does  not bruise/bleed easily.  Psychiatric/Behavioral: Negative for agitation.     Physical Exam Updated Vital Signs Ht 1.753 m (5\' 9" )   Wt 68 kg   LMP 08/21/2013   BMI 22.15 kg/m   Physical Exam Vitals signs and nursing note reviewed.  Constitutional:      General: She is not in acute distress.    Appearance: She is well-developed.  HENT:     Head: Normocephalic and atraumatic.     Mouth/Throat:     Pharynx: No oropharyngeal exudate.  Eyes:     General: No scleral icterus.       Right eye: No discharge.        Left eye: No discharge.     Conjunctiva/sclera: Conjunctivae normal.     Pupils: Pupils are equal, round, and reactive to light.  Neck:     Musculoskeletal: Normal range of motion and neck supple.     Thyroid: No thyromegaly.     Vascular: No JVD.  Cardiovascular:     Rate and Rhythm: Normal rate and regular rhythm.     Heart sounds: Normal heart sounds. No murmur. No friction rub. No gallop.   Pulmonary:     Effort: Pulmonary effort is normal.  No respiratory distress.     Breath sounds: Normal breath sounds. No wheezing or rales.  Abdominal:     General: Bowel sounds are normal. There is no distension.     Palpations: Abdomen is soft. There is no mass.     Tenderness: There is abdominal tenderness.     Comments: Minimal ttp - in the epigastrium and RUQ - more nausea than pain - no other pain - very soft abdomen  Musculoskeletal: Normal range of motion.        General: No tenderness.  Lymphadenopathy:     Cervical: No cervical adenopathy.  Skin:    General: Skin is warm and dry.     Findings: No erythema or rash.  Neurological:     Mental Status: She is alert.     Coordination: Coordination normal.  Psychiatric:        Behavior: Behavior normal.      ED Treatments / Results  Labs (all labs ordered are listed, but only abnormal results are displayed) Labs Reviewed - No data to display  EKG None  Radiology No results found.  Procedures Procedures (including critical care time)  Medications Ordered in ED Medications - No data to display   Initial Impression / Assessment and Plan / ED Course  I have reviewed the triage vital signs and the nursing notes.  Pertinent labs & imaging results that were available during my care of the patient were reviewed by me and considered in my medical decision making (see chart for details).        Patient is in no distress at this time, she does appear nauseated but has not vomited today.  Her history of alcoholism on the chart does raise concern.  She denies drinking any alcohol to me  However it makes sense that gastritis or acid reflux could be contributing.  This could also be pancreatitis or gallbladder disease.  She has had an appendectomy but no other abdominal surgery thus bowel obstruction seems less likely  We will proceed with IV fluids, lipase, metabolic panel urinalysis drug screen and alcohol level.  EKG to rule out prolonged QT and she will likely need  antiemetics as well as to rule out any cardiac causes of her symptoms.  She  is not having any chest pain.  At change of shift care signed out to Dr. Wilson Singer to follow up results and disposition accordingly,  Final Clinical Impressions(s) / ED Diagnoses   Final diagnoses:  None    ED Discharge Orders    None       Noemi Chapel, MD 04/21/19 1519

## 2019-04-21 NOTE — H&P (Signed)
History and Physical    [name]:[id number] DOB: August 11, 1946 DOA: 04/21/2019  PCP: Claretta Fraise, MD   Patient coming from: Home   I have personally briefly reviewed patient's old medical records in [place]  Chief Complaint: Vomiting, upper abdominal pain   HPI: [name] is a 73 y.o. female with medical history significant for hypertension, hypothyroidism, small bowel obstruction, COPD, diverticulosis, who presented to the ED with complaints of 2 episodes of vomiting over the past 5 days.  She reports poor p.o. intake also.  She has not had a bowel movement in the past 5 days, but she denies abdominal distention.  She has not had any episodes of vomiting today. She also reports onset of upper abdominal and lower chest pain about 3 days ago, pain is burning, and radiating.  Denies personal or family history of heart attacks.   ED Course: Blood pressure systolic 947M to 546T.  Heart rate 80s.  WBC-8.3.  Troponin less than 0.03.  Hyponatremia sodium 126.  Creatinine above baseline at 0.98.  TSH ordered in ED and elevated at 21. EKG sinus rhythm without significant ST or T wave abnormalities.  Patient given antiemetics and 1 L bolus in ED.  Hospitalist to admit for hyponatremia.  Review of Systems: As per HPI all other systems reviewed and negative.  Past Medical History:  Diagnosis Date  . Alcoholism ([place]) 01/09/2012  . Anemia   . Anxiety   . B12 deficiency   . Carotid artery narrowing 10/03/2012   On the right.  . Chronic kidney disease (CKD), stage II (mild)   . COPD (chronic obstructive pulmonary disease) ([place])   . Depression   . Headache(784.0)   . Hyperlipidemia   . Hypertension   . Hyponatremia 01/09/2012  . Hypothyroid   . Menopause   . Mini stroke ([place])   . Orthostatic syncope 10/02/2012  . Pneumonia   . SBO (small bowel obstruction) ([place]) 06/28/2016  . Shortness of breath   . Stroke [facility]) 2010   no deficits    Past Surgical History:   Procedure Laterality Date  . AGILE CAPSULE N/A 02/22/2016   dummy capsule remained in the distal ileum  . APPENDECTOMY    . CATARACT EXTRACTION W/PHACO  06/20/2012   Procedure: CATARACT EXTRACTION PHACO AND INTRAOCULAR LENS PLACEMENT (IOC);  Surgeon: Tonny Branch, MD;  Location: [facility];  Service: Ophthalmology;  Laterality: Right;  CDE:10.66  . CATARACT EXTRACTION W/PHACO  07/11/2012   Procedure: CATARACT EXTRACTION PHACO AND INTRAOCULAR LENS PLACEMENT (IOC);  Surgeon: Tonny Branch, MD;  Location: [facility];  Service: Ophthalmology;  Laterality: Left;  CDE: 12.69  . COLONOSCOPY N/A 12/17/2015   RMR: Marked circumferential ulceration of the ascending colon/Ileocecal valve ulceration with biopsy most consistent with NSAID injury versus inflammatory bowel disease, pancolonoc diverticulosis redundant colon  . ESOPHAGOGASTRODUODENOSCOPY N/A 12/17/2015   [id number] gastric polyp, biopsy benign  . KNEE ARTHROSCOPY Right 09/2016  . LUMBAR LAMINECTOMY/DECOMPRESSION MICRODISCECTOMY N/A 05/25/2017   Procedure: CENTRAL DECOMPRESSIVE LUMBAR LAMINECTOMY FOR SPINAL STENOSIS L3-L4, FORAMINOTOMY FOR THE L4 ROOT AND L5 ROOT;  Surgeon: Latanya Maudlin, MD;  Location: [facility];  Service: Orthopedics;  Laterality: N/A;  . TONSILLECTOMY       reports that she quit smoking about 26 years ago. Her smoking use included cigarettes. She has a 60.00 pack-year smoking history. She has never used smokeless tobacco. She reports that she does not drink alcohol or use drugs.  Allergies  Allergen Reactions  . Ciprofloxacin Other (  See Comments)    Caused pt to have delusional thoughts and hallucinations  . Procardia [Nifedipine] Swelling    Edema    . Norvasc [Amlodipine Besylate] Hives    Family History  Problem Relation Age of Onset  . GER disease Mother   . Ulcers Mother   . Hypertension Maternal Grandmother     Prior to Admission medications   Medication Sig Start Date End Date Taking? Authorizing Provider  acetaminophen  (TYLENOL) 500 MG tablet Take 1 tablet (500 mg total) by mouth 2 (two) times daily. Pain 07/02/16   Rexene Alberts, MD  aspirin EC 81 MG tablet Take 81 mg by mouth daily.    [provider]  Coenzyme Q10 (CO Q-10) 100 MG CAPS Take 100 mg by mouth daily. 11/15/16   Claretta Fraise, MD  DULoxetine (CYMBALTA) 60 MG capsule Take I capsule daily 03/31/19   Claretta Fraise, MD  folic acid (FOLVITE) 161 MCG tablet Take 2,000 mcg by mouth daily.    [provider]  levothyroxine (SYNTHROID, LEVOTHROID) 112 MCG tablet Take 1 tablet (112 mcg total) by mouth daily. 12/31/18   Claretta Fraise, MD  liothyronine (CYTOMEL) 5 MCG tablet Take 1/2 tab (2.5) daily 12/31/18   Claretta Fraise, MD  nabumetone (RELAFEN) 500 MG tablet Take 2 tablets (1,000 mg total) by mouth 2 (two) times daily. For muscle and joint pain 12/31/18   Claretta Fraise, MD  olmesartan (BENICAR) 40 MG tablet Take 1 tablet (40 mg total) by mouth daily. 12/31/18   Claretta Fraise, MD  rosuvastatin (CRESTOR) 10 MG tablet Take 1 tablet (10 mg total) by mouth daily. 12/31/18   Claretta Fraise, MD  thiamine (VITAMIN B-1) 100 MG tablet Take 1 tablet (100 mg total) by mouth daily. 04/20/16   Claretta Fraise, MD    Physical Exam: Vitals:   04/21/19 1430 04/21/19 1500 04/21/19 1530 04/21/19 1600  BP: (!) 159/90 (!) 168/80 (!) 168/92 (!) 176/101  Pulse: 87  88 87  Resp: (!) 9 (!) 9 20 13   Temp:      TempSrc:      SpO2: 100%  100% 100%  Weight:      Height:        Constitutional: NAD, calm, comfortable Vitals:   04/21/19 1430 04/21/19 1500 04/21/19 1530 04/21/19 1600  BP: (!) 159/90 (!) 168/80 (!) 168/92 (!) 176/101  Pulse: 87  88 87  Resp: (!) 9 (!) 9 20 13   Temp:      TempSrc:      SpO2: 100%  100% 100%  Weight:      Height:       Eyes: PERRL, lids and conjunctivae normal ENMT: Mucous membranes are moist. Posterior pharynx clear of any exudate or lesions. Neck: normal, supple, no masses, no thyromegaly Respiratory: clear to  auscultation bilaterally, no wheezing, no crackles. Normal respiratory effort. No accessory muscle use.  Cardiovascular: Regular rate and rhythm, no murmurs / rubs / gallops. No extremity edema. 2+ pedal pulses.   Abdomen: no tenderness, no masses palpated. No hepatosplenomegaly. Bowel sounds positive.  Musculoskeletal: no clubbing / cyanosis. No joint deformity upper and lower extremities. Good ROM, no contractures. Normal muscle tone.  Skin: no rashes, lesions, ulcers. No induration Neurologic: CN 2-12 grossly intact.  Strength 5/5 in all 4.  Psychiatric: Normal judgment and insight. Alert and oriented x 3. Normal mood.   Labs on Admission: I have personally reviewed following labs and imaging studies  CBC: Recent Labs  Lab 04/21/19 1446  WBC 8.3  NEUTROABS 5.7  HGB 12.9  HCT 38.6  MCV 91.3  PLT 923   Basic Metabolic Panel: Recent Labs  Lab 04/21/19 1446  NA 126*  K 3.8  CL 93*  CO2 21*  GLUCOSE 97  BUN 17  CREATININE 0.98  CALCIUM 9.3   Liver Function Tests: Recent Labs  Lab 04/21/19 1446  AST 15  ALT 12  ALKPHOS 91  BILITOT 1.2  PROT 7.6  ALBUMIN 4.1   Recent Labs  Lab 04/21/19 1446  LIPASE 29   Cardiac Enzymes: Recent Labs  Lab 04/21/19 1446  TROPONINI <0.03    Radiological Exams on Admission: No results found.  EKG: Independently reviewed.  Sinus rhythm.  QTc 446.  No significant EKG changes compared to prior.  Assessment/Plan Active Problems:   Hyponatremia  Hypovolemic hyponatremia-sodium 126, baseline 139-140s.  Likely from multiple episodes of vomiting over the past 5 days, with poor p.o. intake.  Creatinine is at baseline.  TSH ordered in the ED and elevated at 21, possibly hypothyroidism contributing to hyponatremia. -F/u urine and serum osmolality ordered in the ED, cortisol, random urine sodium potassium diet in the ED -Hydrate Ns + 20 KCl 100cc/hr  - BMP a.m  Vomiting- 5 days duration, without loose stools.  Likely viral  gastroenteritis etiology. SARS-CoV-2 test negative. No abdominal distension.  Last bowel movement 5 days ago. Hx of SBO. -Obtain abdominal x-ray -PRN Zofran -Hydrate -Bowel rest with clear liquid diet, advance as tolerated  Chest pain-lower chest with epigastric pain, burning sensation.  No tenderness on exam.  Likely 2/2 esophagitis from multiple episodes of vomiting. Trop x 1 negative, EKG unchanged, No personal or cardiac hx of CAD. - trend troponin - EKG a.m - IV Famotidine 20 VID  Hypertension-blood pressure elevated 160s to 190. ?non compliance. - PRN labetalol - Resume home ARB at 1/2 dose to start.  Hypothyroidism- Stable.  TSH ordered in ED elevated at 21.  Appears patient has not been complaint with her meds. - Continue Home Medications TSH 126mcg daily, follow-up with primary care provider for medication adjustments.   DVT prophylaxis: Lovenox Code Status: Full Family Communication: None at bedside Disposition Plan: 1-2 days Consults called: None Admission status: Obs, med-surg   Bethena Roys MD [facility]  04/21/2019, 4:44 PM

## 2019-04-21 NOTE — ED Triage Notes (Signed)
Pt c/o n/v x 5 days.  Denies fever, denies diarrhea.  Reports chest pain worse when vomiting.

## 2019-04-21 NOTE — Progress Notes (Signed)
Subjective:    Patient ID: [name], female    DOB: Mar 08, 1946, 73 y.o.   MRN: [id number]   HPI: [name] is a 73 y.o. female presenting for vomiting for 5 days. Vomiting every time she tries to eat. Pain under breast bone,  but no diarrhea. In fact has had no BM for several days. Can only keep down scrambled egg and a slice of bread. No fever, chills or sweats. Took alendronate just before onset. Keeping down some water. Keeping down one small glass in 24 hours.Pt. is so weak she can barely move.     Depression screen [facility] 2/9 12/27/2018 12/13/2018 09/25/2018 09/16/2018 08/15/2018  Decreased Interest 1 1 0 0 0  Down, Depressed, Hopeless 0 0 0 0 0  PHQ - 2 Score 1 1 0 0 0     Relevant past medical, surgical, family and social history reviewed and updated as indicated.  Interim medical history since our last visit reviewed. Allergies and medications reviewed and updated.  ROS:  Review of Systems  Constitutional: Positive for activity change (can barely move) and appetite change (vomiting almost all po). Negative for chills, diaphoresis and fever.  HENT: Negative.   Cardiovascular: Positive for chest pain (substernal. Feels it is due to all of the vomiting. ). Negative for palpitations and leg swelling.  Gastrointestinal: Positive for abdominal pain (diffuse), nausea and vomiting. Negative for blood in stool, constipation, diarrhea and rectal pain.  Genitourinary: Negative for difficulty urinating.  Musculoskeletal: Negative for arthralgias.  Psychiatric/Behavioral: Negative for agitation and confusion.     Social History   Tobacco Use  Smoking Status Former Smoker  . Packs/day: 2.00  . Years: 30.00  . Pack years: 60.00  . Types: Cigarettes  . Last attempt to quit: 06/17/1992  . Years since quitting: 26.8  Smokeless Tobacco Never Used       Objective:     Wt Readings from Last 3 Encounters:  04/21/19 160 lb 3.2 oz (72.7 kg)  12/27/18 163 lb (73.9 kg)   12/13/18 166 lb (75.3 kg)     Exam deferred. Pt. Harboring due to COVID 19. Phone visit performed.   Assessment & Plan:   1. Intractable vomiting with nausea, unspecified vomiting type   2. Dehydration     No orders of the defined types were placed in this encounter.   No orders of the defined types were placed in this encounter.     Diagnoses and all orders for this visit:  Intractable vomiting with nausea, unspecified vomiting type  Dehydration  Based on obvious distress and suspicion for dehydration, pt. Was referred directly to the E.D. for IV hydration and possible admission.  Virtual Visit via telephone Note  I discussed the limitations, risks, security and privacy concerns of performing an evaluation and management service by telephone and the availability of in person appointments. The patient was identified with two identifiers. Pt.expressed understanding and agreed to proceed. Pt. Is at home. Dr. Livia Snellen is in his office.  Follow Up Instructions:   I discussed the assessment and treatment plan with the patient. The patient was provided an opportunity to ask questions and all were answered. The patient agreed with the plan and demonstrated an understanding of the instructions.   The patient was advised to call back or seek an in-person evaluation if the symptoms worsen or if the condition fails to improve as anticipated.   Total minutes including chart review and phone contact time: 35  Follow up plan: Return  after hospital release.  Kelsey Fraise, MD [place]

## 2019-04-21 NOTE — ED Notes (Signed)
ED TO INPATIENT HANDOFF REPORT  ED Nurse Name and Phone #:Ebelin Dillehay  S Name/Age/Gender [name] 73 y.o. female Room/Bed: APA08/APA08  Code Status   Code Status: Prior  Home/SNF/Other Home Patient oriented to: self, place and time Is this baseline? yes   Triage Complete: Triage complete  Chief Complaint VOMITING X 5DAYS  Triage Note Pt c/o n/v x 5 days.  Denies fever, denies diarrhea.  Reports chest pain worse when vomiting.   Allergies Allergies  Allergen Reactions  . Ciprofloxacin Other (See Comments)    Caused pt to have delusional thoughts and hallucinations  . Procardia [Nifedipine] Swelling    Edema    . Norvasc [Amlodipine Besylate] Hives    Level of Care/Admitting Diagnosis ED Disposition    ED Disposition Condition [place] [facility]: [facility] [300923]  Level of Care: Med-Surg [16]  Covid Evaluation: Screening Protocol (No Symptoms)  Diagnosis: Hyponatremia [300762]  Admitting Physician: Bethena Roys [[id number]]  Attending Physician: Bethena Roys [phone]  PT Class (Do Not Modify): Observation [104]  PT Acc Code (Do Not Modify): Observation [10022]       B Medical/Surgery History Past Medical History:  Diagnosis Date  . Alcoholism ([place]) 01/09/2012  . Anemia   . Anxiety   . B12 deficiency   . Carotid artery narrowing 10/03/2012   On the right.  . Chronic kidney disease (CKD), stage II (mild)   . COPD (chronic obstructive pulmonary disease) ([place])   . Depression   . Headache(784.0)   . Hyperlipidemia   . Hypertension   . Hyponatremia 01/09/2012  . Hypothyroid   . Menopause   . Mini stroke ([place])   . Orthostatic syncope 10/02/2012  . Pneumonia   . SBO (small bowel obstruction) ([place]) 06/28/2016  . Shortness of breath   . Stroke [facility]) 2010   no deficits   Past Surgical History:  Procedure Laterality Date  . AGILE CAPSULE N/A 02/22/2016   dummy capsule remained in the distal ileum  . APPENDECTOMY    .  CATARACT EXTRACTION W/PHACO  06/20/2012   Procedure: CATARACT EXTRACTION PHACO AND INTRAOCULAR LENS PLACEMENT (IOC);  Surgeon: Tonny Branch, MD;  Location: [facility];  Service: Ophthalmology;  Laterality: Right;  CDE:10.66  . CATARACT EXTRACTION W/PHACO  07/11/2012   Procedure: CATARACT EXTRACTION PHACO AND INTRAOCULAR LENS PLACEMENT (IOC);  Surgeon: Tonny Branch, MD;  Location: [facility];  Service: Ophthalmology;  Laterality: Left;  CDE: 12.69  . COLONOSCOPY N/A 12/17/2015   RMR: Marked circumferential ulceration of the ascending colon/Ileocecal valve ulceration with biopsy most consistent with NSAID injury versus inflammatory bowel disease, pancolonoc diverticulosis redundant colon  . ESOPHAGOGASTRODUODENOSCOPY N/A 12/17/2015   GYB:WLSLHTDSK gastric polyp, biopsy benign  . KNEE ARTHROSCOPY Right 09/2016  . LUMBAR LAMINECTOMY/DECOMPRESSION MICRODISCECTOMY N/A 05/25/2017   Procedure: CENTRAL DECOMPRESSIVE LUMBAR LAMINECTOMY FOR SPINAL STENOSIS L3-L4, FORAMINOTOMY FOR THE L4 ROOT AND L5 ROOT;  Surgeon: Latanya Maudlin, MD;  Location: [facility];  Service: Orthopedics;  Laterality: N/A;  . TONSILLECTOMY       A IV Location/Drains/Wounds Patient Lines/Drains/Airways Status   Active Line/Drains/Airways    Name:   Placement date:   Placement time:   Site:   Days:   Peripheral IV 04/21/19 Left Antecubital   04/21/19    1428    Antecubital   less than 1   Incision 06/20/12 Eye Right   06/20/12    0907     2496   Incision 07/11/12 Eye Left  07/11/12    0950     2475   Incision (Closed) 05/25/17 Back Other (Comment)   05/25/17    1613     696          Intake/Output Last 24 hours No intake or output data in the 24 hours ending 04/21/19 1657  Labs/Imaging Results for orders placed or performed during the hospital encounter of 04/21/19 (from the past 48 hour(s))  Ethanol     Status: None   Collection Time: 04/21/19  2:46 PM  Result Value Ref Range   Alcohol, Ethyl (B) <10 <10 mg/dL    Comment: (NOTE) Lowest  detectable limit for serum alcohol is 10 mg/dL. For medical purposes only. Performed at [facility], [address], [place], [place] [address]   Comprehensive metabolic panel     Status: Abnormal   Collection Time: 04/21/19  2:46 PM  Result Value Ref Range   Sodium 126 (L) 135 - 145 mmol/L   Potassium 3.8 3.5 - 5.1 mmol/L   Chloride 93 (L) 98 - 111 mmol/L   CO2 21 (L) 22 - 32 mmol/L   Glucose, Bld 97 70 - 99 mg/dL   BUN 17 8 - 23 mg/dL   Creatinine, Ser 0.98 0.44 - 1.00 mg/dL   Calcium 9.3 8.9 - 10.3 mg/dL   Total Protein 7.6 6.5 - 8.1 g/dL   Albumin 4.1 3.5 - 5.0 g/dL   AST 15 15 - 41 U/L   ALT 12 0 - 44 U/L   Alkaline Phosphatase 91 38 - 126 U/L   Total Bilirubin 1.2 0.3 - 1.2 mg/dL   GFR calc non Af Amer 58 (L) >60 mL/min   GFR calc Af Amer >60 >60 mL/min   Anion gap 12 5 - 15    Comment: Performed at [facility], [address], [place], [place] [address]  CBC with Differential/Platelet     Status: None   Collection Time: 04/21/19  2:46 PM  Result Value Ref Range   WBC 8.3 4.0 - 10.5 K/uL   RBC 4.23 3.87 - 5.11 MIL/uL   Hemoglobin 12.9 12.0 - 15.0 g/dL   HCT 38.6 36.0 - 46.0 %   MCV 91.3 80.0 - 100.0 fL   MCH 30.5 26.0 - 34.0 pg   MCHC 33.4 30.0 - 36.0 g/dL   RDW 13.5 11.5 - 15.5 %   Platelets 278 150 - 400 K/uL   nRBC 0.0 0.0 - 0.2 %   Neutrophils Relative % 69 %   Neutro Abs 5.7 1.7 - 7.7 K/uL   Lymphocytes Relative 20 %   Lymphs Abs 1.7 0.7 - 4.0 K/uL   Monocytes Relative 10 %   Monocytes Absolute 0.8 0.1 - 1.0 K/uL   Eosinophils Relative 0 %   Eosinophils Absolute 0.0 0.0 - 0.5 K/uL   Basophils Relative 0 %   Basophils Absolute 0.0 0.0 - 0.1 K/uL   Immature Granulocytes 1 %   Abs Immature Granulocytes 0.07 0.00 - 0.07 K/uL    Comment: Performed at [facility], [address] [place], [place] [address]  Lipase, blood     Status: None   Collection Time: 04/21/19  2:46 PM  Result Value Ref Range   Lipase 29 11 - 51 U/L    Comment: Performed at [facility], [address], [place], [place] [address]  Troponin I - ONCE - STAT     Status: None   Collection Time: 04/21/19  2:46 PM  Result Value  Ref Range   Troponin I <0.03 <0.03 ng/mL    Comment: Performed at [facility], [address], [place], [place] [address]  Rapid urine drug screen (hospital performed)     Status: None   Collection Time: 04/21/19  3:45 PM  Result Value Ref Range   Opiates NONE DETECTED NONE DETECTED   Cocaine NONE DETECTED NONE DETECTED   Benzodiazepines NONE DETECTED NONE DETECTED   Amphetamines NONE DETECTED NONE DETECTED   Tetrahydrocannabinol NONE DETECTED NONE DETECTED   Barbiturates NONE DETECTED NONE DETECTED    Comment: (NOTE) DRUG SCREEN FOR MEDICAL PURPOSES ONLY.  IF CONFIRMATION IS NEEDED FOR ANY PURPOSE, NOTIFY LAB WITHIN 5 DAYS. LOWEST DETECTABLE LIMITS FOR URINE DRUG SCREEN Drug Class                     Cutoff (ng/mL) Amphetamine and metabolites    1000 Barbiturate and metabolites    200 Benzodiazepine                 315 Tricyclics and metabolites     300 Opiates and metabolites        300 Cocaine and metabolites        300 THC                            50 Performed at [facility], [address], [place], [place] [address]   Creatinine, urine, random     Status: None   Collection Time: 04/21/19  3:47 PM  Result Value Ref Range   Creatinine, Urine 50.29 mg/dL    Comment: Performed at [facility], [address] [place], [place] [address]  Na and K (sodium & potassium), rand urine     Status: None   Collection Time: 04/21/19  3:47 PM  Result Value Ref Range   Sodium, Ur 25 mmol/L   Potassium Urine 11 mmol/L    Comment: Performed at [facility], [address] [place], [place] [address]   No results found.  Pending Labs Unresulted Labs (From admission, onward)    Start     Ordered   04/21/19 1616  SARS Coronavirus 2 (CEPHEID - Performed in [place] hospital lab), [place]  (Asymptomatic Patients Labs)  Once,   R    Question:   Rule Out  Answer:  Yes   04/21/19 1615   04/21/19 1559  Magnesium  ONCE - STAT,   STAT     04/21/19 1558   04/21/19 1559  Phosphorus  ONCE - STAT,   STAT     04/21/19 1558   04/21/19 1559  Cortisol  Once,   R     04/21/19 1558   04/21/19 1555  TSH  ONCE - STAT,   STAT     04/21/19 1558   04/21/19 1555  Osmolality  ONCE - STAT,   STAT     04/21/19 1558   04/21/19 1555  Osmolality, urine  Once,   R     04/21/19 1558   04/21/19 1408  Urinalysis, Routine w reflex microscopic  Once,   R     04/21/19 1407   Signed and Held  Basic metabolic panel  Tomorrow morning,   R     Signed and Held          Vitals/Pain Today's Vitals   04/21/19 1500 04/21/19 1530 04/21/19 1600 04/21/19 1630  BP: (!) 168/80 (!) 168/92 (!) 176/101 (!) 179/84  Pulse:  88 87 84  Resp: (!) 9 20 13 13   Temp:      TempSrc:      SpO2:  100% 100% 100%  Weight:      Height:      PainSc:        Isolation Precautions No active isolations  Medications Medications  ondansetron (ZOFRAN) injection 4 mg (4 mg Intravenous Given 04/21/19 1436)  sodium chloride 0.9 % bolus 1,000 mL (0 mLs Intravenous Stopped 04/21/19 1601)    Mobility walks Low fall risk   Focused Assessments    R Recommendations: See Admitting Provider Note  Report given to:   Additional Notes:

## 2019-04-22 ENCOUNTER — Encounter: Payer: Self-pay | Admitting: Family Medicine

## 2019-04-22 DIAGNOSIS — E871 Hypo-osmolality and hyponatremia: Secondary | ICD-10-CM | POA: Diagnosis not present

## 2019-04-22 LAB — BASIC METABOLIC PANEL
Anion gap: 10 (ref 5–15)
BUN: 13 mg/dL (ref 8–23)
CO2: 22 mmol/L (ref 22–32)
Calcium: 8.7 mg/dL — ABNORMAL LOW (ref 8.9–10.3)
Chloride: 100 mmol/L (ref 98–111)
Creatinine, Ser: 0.91 mg/dL (ref 0.44–1.00)
GFR calc Af Amer: >60 mL/min (ref >60)
GFR calc non Af Amer: >60 mL/min (ref >60)
Glucose, Bld: 95 mg/dL (ref 70–99)
Potassium: 4.4 mmol/L (ref 3.5–5.1)
Sodium: 132 mmol/L — ABNORMAL LOW (ref 135–145)

## 2019-04-22 LAB — T4, FREE: Free T4: 0.76 ng/dL — ABNORMAL LOW (ref 0.82–1.77)

## 2019-04-22 LAB — TROPONIN I: Troponin I: <0.03 ng/mL (ref <0.03)

## 2019-04-22 MED ORDER — ONDANSETRON HCL 4 MG PO TABS: 4.0000 mg | ORAL_TABLET | Freq: Four times a day (QID) | ORAL | 0 refills | Status: DC | PRN

## 2019-04-22 NOTE — Discharge Summary (Signed)
Physician Discharge Summary  HAZLEIGH Gross JQB:[id number] DOB: 05-10-1946 DOA: 04/21/2019  PCP: Claretta Fraise, MD  Admit date: 04/21/2019  Discharge date: 04/22/2019  Admitted From:Home  Disposition:  Home  Recommendations for Outpatient Follow-up:  1. Follow up with PCP in 1-2 weeks 2. Please follow-up repeat TSH levels and ensure compliance on thyroid medications 3. Will need close monitoring of blood pressure readings  Home Health: None  Equipment/Devices: None  Discharge Condition: Stable  CODE STATUS: Full  Diet recommendation: Heart Healthy  Brief/Interim Summary: Per HPI: [name] is a 73 y.o. female with medical history significant for hypertension, hypothyroidism, small bowel obstruction, COPD, diverticulosis, who presented to the ED with complaints of 2 episodes of vomiting over the past 5 days.  She reports poor p.o. intake also.  She has not had a bowel movement in the past 5 days, but she denies abdominal distention.  She has not had any episodes of vomiting today. She also reports onset of upper abdominal and lower chest pain about 3 days ago, pain is burning, and radiating.  Denies personal or family history of heart attacks.   Patient was admitted with hypovolemic hyponatremia secondary to dehydration from vomiting.  This is thought to be secondary to viral gastroenteritis and COVID testing was noted to be negative.  No signs of SBO noted on abdominal films, however patient does have history of SBO.  She was placed on bowel rest and ordered IV fluid with improvement in her sodium levels to 132 this morning.  She appears to be tolerating her diet well and is stable for discharge this morning.  TSH noted to be elevated at 21 and it appears that possibly some poorly controlled hypothyroidism could be contributing to her hyponatremia.  She needs close monitoring of her blood pressure levels to and there is some questionable noncompliance with her home medications as  well.  She will follow-up with her PCP in the next 1 to 2 weeks.  No other acute events noted during this brief admission.  Discharge Diagnoses:  Active Problems:   Hyponatremia  Principal discharge diagnosis: Hypovolemic hyponatremia secondary to dehydration from nausea/vomiting with viral gastroenteritis.  Discharge Instructions  Discharge Instructions    Diet - low sodium heart healthy   Complete by:  As directed    Increase activity slowly   Complete by:  As directed      Allergies as of 04/22/2019      Reactions   Ciprofloxacin Other (See Comments)   Caused pt to have delusional thoughts and hallucinations   Procardia [nifedipine] Swelling   Edema    Norvasc [amlodipine Besylate] Hives      Medication List    TAKE these medications   acetaminophen 500 MG tablet Commonly known as:  TYLENOL Take 1 tablet (500 mg total) by mouth 2 (two) times daily. Pain   alendronate 70 MG tablet Commonly known as:  FOSAMAX Take 70 mg by mouth once a week. Take with a full glass of water on an empty stomach.   aspirin EC 81 MG tablet Take 81 mg by mouth daily.   Co Q-10 100 MG Caps Take 100 mg by mouth daily.   folic acid 409 MCG tablet Commonly known as:  FOLVITE Take 2,000 mcg by mouth daily.   levothyroxine 112 MCG tablet Commonly known as:  SYNTHROID Take 1 tablet (112 mcg total) by mouth daily.   liothyronine 5 MCG tablet Commonly known as:  Cytomel Take 1/2 tab (2.5) daily What  changed:    how much to take  how to take this  when to take this  additional instructions   nabumetone 500 MG tablet Commonly known as:  RELAFEN Take 2 tablets (1,000 mg total) by mouth 2 (two) times daily. For muscle and joint pain   olmesartan 40 MG tablet Commonly known as:  Benicar Take 1 tablet (40 mg total) by mouth daily.   ondansetron 4 MG tablet Commonly known as:  ZOFRAN Take 1 tablet (4 mg total) by mouth every 6 (six) hours as needed for nausea.   rosuvastatin 10  MG tablet Commonly known as:  Crestor Take 1 tablet (10 mg total) by mouth daily.   thiamine 100 MG tablet Commonly known as:  VITAMIN B-1 Take 1 tablet (100 mg total) by mouth daily.      Follow-up Information    Claretta Fraise, MD Follow up in 1 week(s).   Specialty:  Family Medicine Contact information: [address] [place] [address] [phone]          Allergies  Allergen Reactions  . Ciprofloxacin Other (See Comments)    Caused pt to have delusional thoughts and hallucinations  . Procardia [Nifedipine] Swelling    Edema    . Norvasc [Amlodipine Besylate] Hives    Consultations:  None   Procedures/Studies: Dg Abd 1 View  Result Date: 04/21/2019 CLINICAL DATA:  Nausea and vomiting for 5 days. EXAM: ABDOMEN - 1 VIEW COMPARISON:  Single-view of the abdomen 06/23/2016 and 02/24/2016. FINDINGS: The bowel gas pattern is normal. No radio-opaque calculi or other significant radiographic abnormality are seen. IMPRESSION: Negative exam. Electronically Signed   By: Inge Rise M.D.   On: 04/21/2019 18:45     Discharge Exam: Vitals:   04/22/19 0546 04/22/19 0700  BP: (!) 174/81   Pulse: 91   Resp:    Temp: 99 F (37.2 C)   SpO2: 100% 98%   Vitals:   04/21/19 1840 04/21/19 2116 04/22/19 0546 04/22/19 0700  BP: (!) 194/92 (!) 155/83 (!) 174/81   Pulse: 90 85 91   Resp: 18 16    Temp: 98.8 F (37.1 C) 99.2 F (37.3 C) 99 F (37.2 C)   TempSrc:  Oral Oral   SpO2: 100% 100% 100% 98%  Weight: 72.7 kg     Height: 5\' 9"  (1.753 m)       General: Pt is alert, awake, not in acute distress Cardiovascular: RRR, S1/S2 +, no rubs, no gallops Respiratory: CTA bilaterally, no wheezing, no rhonchi Abdominal: Soft, NT, ND, bowel sounds + Extremities: no edema, no cyanosis    The results of significant diagnostics from this hospitalization (including imaging, microbiology, ancillary and laboratory) are listed below for reference.      Microbiology: Recent Results (from the past 240 hour(s))  SARS Coronavirus 2 (CEPHEID - Performed in [place] hospital lab), Hosp Order     Status: None   Collection Time: 04/21/19  4:16 PM  Result Value Ref Range Status   SARS Coronavirus 2 NEGATIVE NEGATIVE Final    Comment: (NOTE) If result is NEGATIVE SARS-CoV-2 target nucleic acids are NOT DETECTED. The SARS-CoV-2 RNA is generally detectable in upper and lower  respiratory specimens during the acute phase of infection. The lowest  concentration of SARS-CoV-2 viral copies this assay can detect is 250  copies / mL. A negative result does not preclude SARS-CoV-2 infection  and should not be used as the sole basis for treatment or other  patient  management decisions.  A negative result may occur with  improper specimen collection / handling, submission of specimen other  than nasopharyngeal swab, presence of viral mutation(s) within the  areas targeted by this assay, and inadequate number of viral copies  (<250 copies / mL). A negative result must be combined with clinical  observations, patient history, and epidemiological information. If result is POSITIVE SARS-CoV-2 target nucleic acids are DETECTED. The SARS-CoV-2 RNA is generally detectable in upper and lower  respiratory specimens dur ing the acute phase of infection.  Positive  results are indicative of active infection with SARS-CoV-2.  Clinical  correlation with patient history and other diagnostic information is  necessary to determine patient infection status.  Positive results do  not rule out bacterial infection or co-infection with other viruses. If result is PRESUMPTIVE POSTIVE SARS-CoV-2 nucleic acids MAY BE PRESENT.   A presumptive positive result was obtained on the submitted specimen  and confirmed on repeat testing.  While 2019 novel coronavirus  (SARS-CoV-2) nucleic acids may be present in the submitted sample  additional confirmatory testing may be  necessary for epidemiological  and / or clinical management purposes  to differentiate between  SARS-CoV-2 and other Sarbecovirus currently known to infect humans.  If clinically indicated additional testing with an alternate test  methodology [phone]) is advised. The SARS-CoV-2 RNA is generally  detectable in upper and lower respiratory sp ecimens during the acute  phase of infection. The expected result is Negative. Fact Sheet for Patients:  StrictlyIdeas.no Fact Sheet for Healthcare Providers: BankingDealers.co.za This test is not yet approved or cleared by the [place] FDA and has been authorized for detection and/or diagnosis of SARS-CoV-2 by FDA under an Emergency Use Authorization (EUA).  This EUA will remain in effect (meaning this test can be used) for the duration of the COVID-19 declaration under Section 564(b)(1) of the Act, 21 U.S.C. section 360bbb-3(b)(1), unless the authorization is terminated or revoked sooner. Performed at [facility], [address], [place], [place] [address]      Labs: BNP (last 3 results) No results for input(s): BNP in the last 8760 hours. Basic Metabolic Panel: Recent Labs  Lab 04/21/19 1446 04/21/19 1610 04/22/19 0149  NA 126*  --  132*  K 3.8  --  4.4  CL 93*  --  100  CO2 21*  --  22  GLUCOSE 97  --  95  BUN 17  --  13  CREATININE 0.98  --  0.91  CALCIUM 9.3  --  8.7*  MG  --  1.9  --   PHOS  --  2.5  --    Liver Function Tests: Recent Labs  Lab 04/21/19 1446  AST 15  ALT 12  ALKPHOS 91  BILITOT 1.2  PROT 7.6  ALBUMIN 4.1   Recent Labs  Lab 04/21/19 1446  LIPASE 29   No results for input(s): AMMONIA in the last 168 hours. CBC: Recent Labs  Lab 04/21/19 1446  WBC 8.3  NEUTROABS 5.7  HGB 12.9  HCT 38.6  MCV 91.3  PLT 278   Cardiac Enzymes: Recent Labs  Lab 04/21/19 1446 04/21/19 1944 04/22/19 0149  TROPONINI <0.03 <0.03 <0.03   BNP: Invalid  input(s): POCBNP CBG: No results for input(s): GLUCAP in the last 168 hours. D-Dimer No results for input(s): DDIMER in the last 72 hours. Hgb A1c No results for input(s): HGBA1C in the last 72 hours. Lipid Profile No results for input(s): CHOL, HDL, LDLCALC, TRIG, CHOLHDL,  LDLDIRECT in the last 72 hours. Thyroid function studies Recent Labs    04/21/19 1615  TSH 21.058*   Anemia work up No results for input(s): VITAMINB12, FOLATE, FERRITIN, TIBC, IRON, RETICCTPCT in the last 72 hours. Urinalysis    Component Value Date/Time   COLORURINE YELLOW 04/21/2019 1547   APPEARANCEUR CLEAR 04/21/2019 1547   APPEARANCEUR Clear 12/27/2018 0928   LABSPEC 1.004 (L) 04/21/2019 1547   PHURINE 6.0 04/21/2019 1547   GLUCOSEU NEGATIVE 04/21/2019 1547   HGBUR NEGATIVE 04/21/2019 1547   BILIRUBINUR NEGATIVE 04/21/2019 1547   BILIRUBINUR Negative 12/27/2018 0928   KETONESUR NEGATIVE 04/21/2019 1547   PROTEINUR NEGATIVE 04/21/2019 1547   UROBILINOGEN negative 09/10/2015 1624   UROBILINOGEN 0.2 04/30/2015 0346   NITRITE NEGATIVE 04/21/2019 1547   LEUKOCYTESUR SMALL (A) 04/21/2019 1547   Sepsis Labs Invalid input(s): PROCALCITONIN,  WBC,  LACTICIDVEN Microbiology Recent Results (from the past 240 hour(s))  SARS Coronavirus 2 (CEPHEID - Performed in [place] hospital lab), Hosp Order     Status: None   Collection Time: 04/21/19  4:16 PM  Result Value Ref Range Status   SARS Coronavirus 2 NEGATIVE NEGATIVE Final    Comment: (NOTE) If result is NEGATIVE SARS-CoV-2 target nucleic acids are NOT DETECTED. The SARS-CoV-2 RNA is generally detectable in upper and lower  respiratory specimens during the acute phase of infection. The lowest  concentration of SARS-CoV-2 viral copies this assay can detect is 250  copies / mL. A negative result does not preclude SARS-CoV-2 infection  and should not be used as the sole basis for treatment or other  patient management decisions.  A negative result may  occur with  improper specimen collection / handling, submission of specimen other  than nasopharyngeal swab, presence of viral mutation(s) within the  areas targeted by this assay, and inadequate number of viral copies  (<250 copies / mL). A negative result must be combined with clinical  observations, patient history, and epidemiological information. If result is POSITIVE SARS-CoV-2 target nucleic acids are DETECTED. The SARS-CoV-2 RNA is generally detectable in upper and lower  respiratory specimens dur ing the acute phase of infection.  Positive  results are indicative of active infection with SARS-CoV-2.  Clinical  correlation with patient history and other diagnostic information is  necessary to determine patient infection status.  Positive results do  not rule out bacterial infection or co-infection with other viruses. If result is PRESUMPTIVE POSTIVE SARS-CoV-2 nucleic acids MAY BE PRESENT.   A presumptive positive result was obtained on the submitted specimen  and confirmed on repeat testing.  While 2019 novel coronavirus  (SARS-CoV-2) nucleic acids may be present in the submitted sample  additional confirmatory testing may be necessary for epidemiological  and / or clinical management purposes  to differentiate between  SARS-CoV-2 and other Sarbecovirus currently known to infect humans.  If clinically indicated additional testing with an alternate test  methodology [phone]) is advised. The SARS-CoV-2 RNA is generally  detectable in upper and lower respiratory sp ecimens during the acute  phase of infection. The expected result is Negative. Fact Sheet for Patients:  StrictlyIdeas.no Fact Sheet for Healthcare Providers: BankingDealers.co.za This test is not yet approved or cleared by the [place] FDA and has been authorized for detection and/or diagnosis of SARS-CoV-2 by FDA under an Emergency Use Authorization (EUA).  This  EUA will remain in effect (meaning this test can be used) for the duration of the COVID-19 declaration under Section 564(b)(1) of the Act, 21  U.S.C. section 360bbb-3(b)(1), unless the authorization is terminated or revoked sooner. Performed at [facility], [address] [place], [place] [address]      Time coordinating discharge: 35 minutes  SIGNED:   Rodena Goldmann, DO [facility] 04/22/2019, 9:09 AM  If 7PM-7AM, please contact night-coverage [email or web address] Password [email or web address]

## 2019-04-22 NOTE — Progress Notes (Signed)
Tolerated solid food for lunch.  IV removed and discharge instructions reviewed .  Son to give ride home

## 2019-04-22 NOTE — TOC Transition Note (Signed)
Transition of Care [facility]) - CM/SW Discharge Note   Patient Details  Name: [name] MRN: [id number] Date of Birth: [birth year]  [facility] of [facility] CM/SW Contact:  Boneta Lucks, RN Phone Number: 04/22/2019, 9:26 AM   Clinical Narrative:   Reviewed Patient charge, no needs identified at present, made patient a follow up appointment with PCP in 1 week. May 20th at 9:10.      Final next level of care: Home/Self Care Barriers to Discharge: No Barriers Identified   Patient Goals and CMS Choice Patient states their goals for this hospitalization and ongoing recovery are:: To discharge Home.      Discharge Placement                       Discharge Plan and Services                                     Social Determinants of Health (SDOH) Interventions     Readmission Risk Interventions No flowsheet data found.

## 2019-04-23 ENCOUNTER — Other Ambulatory Visit: Payer: Self-pay | Admitting: Family Medicine

## 2019-04-23 LAB — T3: T3, Total: 44 ng/dL — ABNORMAL LOW (ref 71–180)

## 2019-04-23 MED ORDER — LIOTHYRONINE SODIUM 5 MCG PO TABS: 10.0000 ug | ORAL_TABLET | Freq: Every day | ORAL | 1 refills | Status: DC

## 2019-04-23 NOTE — Progress Notes (Signed)
Hello Cherye,  Your lab result is normal.Some minor variations that are not significant are commonly marked abnormal, but do not represent any medical problem for you.  Best regards, Claretta Fraise, M.D.

## 2019-04-23 NOTE — Progress Notes (Signed)
T3 low on check at hospital. Pt. Had missed a few days due to the vomiting. Not likely enough to cause the drop in her level. She has been taking 7.5 mg daily (written for 2.5) I encouraged increase to 10 mg. Scrip sent.  Sodium was low at hospital admission. Will check BMP at follow up . I asked her to come in in one week. WS

## 2019-04-24 ENCOUNTER — Ambulatory Visit: Payer: Medicare HMO | Admitting: *Deleted

## 2019-04-24 NOTE — Chronic Care Management (AMB) (Signed)
Transitional Care Management  Telephone Note  04/24/2019  [name] is a primary care patient of Claretta Fraise, MD who was hospitalized on 04/21/2019 with complaints of abdominal pain and diarrhea.   An outgoing telephone call was made and I spoke with the [name].  An appointment for Transitional Care Management is scheduled on 04/30/19 with Dr Livia Snellen.  [name] was knowledgeable about her condition and she did not have any questions.   DISCHARGE INFORMATION Date of Discharge:04/22/19  Discharge Facility: [facility]  Principal Discharge Diagnosis: viral gastroenteritis resulting in hyponatremia  Outpatient Follow Up Recommendations (from discharge summary) Recommendations for Outpatient Follow-up:  1. Follow up with PCP in 1-2 weeks 2. Please follow-up repeat TSH levels and ensure compliance on thyroid medications 3. Will need close monitoring of blood pressure readings   MEDICATION RECONCILIATION A post discharge medication reconciliation was performed and the complete medication list was reviewed with the patient/caregiver and is current as of 04/24/2019.   Current Medication List Outpatient Encounter Medications as of 04/24/2019  Medication Sig  . acetaminophen (TYLENOL) 500 MG tablet Take 1 tablet (500 mg total) by mouth 2 (two) times daily. Pain  . alendronate (FOSAMAX) 70 MG tablet Take 70 mg by mouth once a week. Take with a full glass of water on an empty stomach.  Marland Kitchen aspirin EC 81 MG tablet Take 81 mg by mouth daily.  . Coenzyme Q10 (CO Q-10) 100 MG CAPS Take 100 mg by mouth daily.  . folic acid (FOLVITE) 161 MCG tablet Take 2,000 mcg by mouth daily.  Marland Kitchen levothyroxine (SYNTHROID, LEVOTHROID) 112 MCG tablet Take 1 tablet (112 mcg total) by mouth daily.  Marland Kitchen liothyronine (CYTOMEL) 5 MCG tablet Take 2 tablets (10 mcg total) by mouth daily.  . nabumetone (RELAFEN) 500 MG tablet Take 2 tablets (1,000 mg total) by mouth 2 (two) times daily. For muscle and joint pain  .  olmesartan (BENICAR) 40 MG tablet Take 1 tablet (40 mg total) by mouth daily.  . ondansetron (ZOFRAN) 4 MG tablet Take 1 tablet (4 mg total) by mouth every 6 (six) hours as needed for nausea.  . rosuvastatin (CRESTOR) 10 MG tablet Take 1 tablet (10 mg total) by mouth daily.  Marland Kitchen thiamine (VITAMIN B-1) 100 MG tablet Take 1 tablet (100 mg total) by mouth daily.   No facility-administered encounter medications on file as of 04/24/2019.     ACTIVITIES OF DAILY LIVING  Patient is able to perform ADLs independently and this is is not a change from baseline.  The primary caregiver is: self  Patient is receiving home health services: No    PATIENT EDUCATION . Take all medications as prescribed . Contact our office sooner than your scheduled appointment if you have any questions or concerns . Continue to drink plenty of fluids but avoid caffeine and dairy  Patient would be a good candidate for CCM services. Will recommend to PCP.  Chong Sicilian, RN-BC, BSN Nurse Case Manager [facility] Ph: [phone]

## 2019-04-29 ENCOUNTER — Other Ambulatory Visit: Payer: Self-pay

## 2019-04-30 ENCOUNTER — Other Ambulatory Visit: Payer: Self-pay

## 2019-04-30 ENCOUNTER — Encounter: Payer: Self-pay | Admitting: Family Medicine

## 2019-04-30 ENCOUNTER — Ambulatory Visit (INDEPENDENT_AMBULATORY_CARE_PROVIDER_SITE_OTHER): Payer: Medicare HMO | Admitting: Family Medicine

## 2019-04-30 VITALS — BP 183/99 | HR 95 | Temp 97.7°F | Ht 69.0 in | Wt 161.0 lb

## 2019-04-30 DIAGNOSIS — E038 Other specified hypothyroidism: Secondary | ICD-10-CM

## 2019-04-30 DIAGNOSIS — R600 Localized edema: Secondary | ICD-10-CM | POA: Diagnosis not present

## 2019-04-30 DIAGNOSIS — M25462 Effusion, left knee: Secondary | ICD-10-CM

## 2019-04-30 DIAGNOSIS — E871 Hypo-osmolality and hyponatremia: Secondary | ICD-10-CM | POA: Diagnosis not present

## 2019-04-30 DIAGNOSIS — I10 Essential (primary) hypertension: Secondary | ICD-10-CM | POA: Diagnosis not present

## 2019-04-30 NOTE — Patient Instructions (Signed)
Return in 1 month

## 2019-04-30 NOTE — Progress Notes (Signed)
Subjective:  Patient ID: [name], female    DOB: 12/15/1945  Age: 73 y.o. MRN: [id number]  CC: Hospitalization Follow-up   HPI [name] presents for  Transition of care from hospitalization on 5/11 with discharge on 5/12. Now feeling muh better. Able to eat and hold food down without vomiting. Appetite is good. No abdominal pain. Her sodium went from 126 to 132 during hospsitalization. Needs to be rechecked. Additionally herTSH, Free T4 and T3 total were all underactive range at time of admission. She fell due to rain tthis morning - denies injury but feels it drove her BP up.  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   She has a past surgical history that includes Appendectomy; Tonsillectomy; Cataract extraction w/PHACO (06/20/2012); Cataract extraction w/PHACO (07/11/2012); Colonoscopy (N/A, 12/17/2015); Esophagogastroduodenoscopy (N/A, 12/17/2015); Agile capsule (N/A, 02/22/2016); Knee arthroscopy (Right, 09/2016); and Lumbar laminectomy/decompression microdiscectomy (N/A, 05/25/2017).   Her family history includes GER disease in her mother;  Hypertension in her maternal grandmother; Ulcers in her mother.She reports that she quit smoking about 26 years ago. Her smoking use included cigarettes. She has a 60.00 pack-year smoking history. She has never used smokeless tobacco. She reports that she does not drink alcohol or use drugs.    ROS Review of Systems  Constitutional: Negative.   HENT: Negative for congestion.   Eyes: Negative for visual disturbance.  Respiratory: Negative for shortness of breath.   Cardiovascular: Positive for leg swelling (left to ankle). Negative for chest pain.  Gastrointestinal: Negative for abdominal pain, constipation, diarrhea, nausea and vomiting.  Genitourinary: Negative for difficulty urinating.  Musculoskeletal: Positive for arthralgias and joint swelling (left knee). Negative for myalgias.  Neurological: Negative for headaches.  Psychiatric/Behavioral: Negative for sleep disturbance. The patient is nervous/anxious.     Objective:  BP (!) 183/99   Pulse 95   Temp 97.7 F (36.5 C) (Oral)   Ht 5' 9"  (1.753 m)   Wt 161 lb (73 kg)   LMP 08/21/2013   BMI 23.78 kg/m   BP Readings from Last 3 Encounters:  04/30/19 (!) 183/99  04/22/19 (!) 174/81  12/27/18 (!) 160/95    Wt Readings from Last 3 Encounters:  04/30/19 161 lb (73 kg)  04/21/19 160 lb 3.2 oz (72.7 kg)  12/27/18 163 lb (73.9 kg)     Physical Exam Constitutional:      General: She is not in acute distress.    Appearance: She is well-developed.  HENT:     Head: Normocephalic and atraumatic.     Right Ear: External ear normal.     Left Ear: External ear normal.     Nose: Nose normal.  Eyes:     Conjunctiva/sclera: Conjunctivae normal.     Pupils: Pupils are equal, round, and reactive to light.  Neck:     Musculoskeletal: Normal range of motion and neck supple.     Thyroid: No thyromegaly.  Cardiovascular:     Rate and Rhythm: Normal rate and regular rhythm.     Heart sounds: Normal heart sounds. No murmur.   Pulmonary:     Effort: Pulmonary effort is normal. No respiratory distress.     Breath sounds: Normal breath sounds. No wheezing or rales.  Abdominal:     General: Bowel sounds are normal. There is no distension.     Palpations: Abdomen is soft.     Tenderness: There is no abdominal tenderness.  Musculoskeletal:        General: Tenderness (at left knee.) present.     Left lower leg: Edema (Knee effusion and edema to ankle 2+) present.  Lymphadenopathy:     Cervical: No cervical adenopathy.  Skin:    General: Skin is warm and dry.  Neurological:     Mental Status: She is alert and oriented to person, place, and time.     Deep Tendon Reflexes: Reflexes are normal and symmetric.  Psychiatric:        Behavior: Behavior normal.        Thought Content: Thought content normal.        Judgment: Judgment normal.       Assessment & Plan:   Meliah was seen today for hospitalization follow-up.  Diagnoses and all orders for this visit:  Essential hypertension -     CBC with Differential/Platelet -     CMP14+EGFR  Other specified hypothyroidism -     T3, Free  Hyponatremia  Knee effusion, left  Edema of leg       I am having [name]. Price maintain her aspirin EC, thiamine, acetaminophen, Co U-20, folic acid, rosuvastatin, olmesartan, nabumetone, levothyroxine, alendronate, ondansetron, and liothyronine.  Allergies as of 04/30/2019      Reactions   Ciprofloxacin Other (See Comments)   Caused pt to have delusional thoughts and hallucinations   Procardia [nifedipine] Swelling   Edema    Norvasc [amlodipine Besylate] Hives      Medication List       Accurate as of Apr 30, 2019  9:53 AM. If you have any questions, ask your nurse or doctor.        acetaminophen 500 MG tablet Commonly known as:  TYLENOL Take 1 tablet (500 mg total) by mouth 2 (two) times daily. Pain   alendronate 70 MG tablet Commonly known as:  FOSAMAX Take 70 mg by mouth once a week. Take with  a full glass of water on an empty stomach.   aspirin EC 81 MG tablet Take 81 mg by mouth daily.   Co Q-10 100 MG Caps Take 100 mg by mouth daily.   folic acid 254 MCG tablet Commonly known as:  FOLVITE Take 2,000 mcg by mouth daily.   levothyroxine 112 MCG tablet Commonly known as:  SYNTHROID Take 1 tablet (112 mcg total) by mouth daily.   liothyronine 5 MCG tablet Commonly known as:  Cytomel Take 2 tablets (10 mcg total) by mouth daily.   nabumetone 500 MG tablet Commonly known as:  RELAFEN Take 2 tablets (1,000 mg total) by mouth 2 (two) times daily. For muscle and joint pain   olmesartan 40 MG tablet Commonly known as:  Benicar Take 1 tablet (40 mg  total) by mouth daily.   ondansetron 4 MG tablet Commonly known as:  ZOFRAN Take 1 tablet (4 mg total) by mouth every 6 (six) hours as needed for nausea.   rosuvastatin 10 MG tablet Commonly known as:  Crestor Take 1 tablet (10 mg total) by mouth daily.   thiamine 100 MG tablet Commonly known as:  VITAMIN B-1 Take 1 tablet (100 mg total) by mouth daily.        Follow-up: Return in about 1 month (around 05/31/2019).  Claretta Fraise, M.D.

## 2019-05-01 LAB — CBC WITH DIFFERENTIAL/PLATELET
Basophils Absolute: 0 10*3/uL (ref 0.0–0.2)
Basos: 1 %
EOS (ABSOLUTE): 0.2 10*3/uL (ref 0.0–0.4)
Eos: 3 %
Hematocrit: 33.7 % — ABNORMAL LOW (ref 34.0–46.6)
Hemoglobin: 11.4 g/dL (ref 11.1–15.9)
Immature Grans (Abs): 0 10*3/uL (ref 0.0–0.1)
Immature Granulocytes: 1 %
Lymphocytes Absolute: 1.5 10*3/uL (ref 0.7–3.1)
Lymphs: 25 %
MCH: 30.2 pg (ref 26.6–33.0)
MCHC: 33.8 g/dL (ref 31.5–35.7)
MCV: 89 fL (ref 79–97)
Monocytes Absolute: 0.5 10*3/uL (ref 0.1–0.9)
Monocytes: 8 %
Neutrophils Absolute: 3.8 10*3/uL (ref 1.4–7.0)
Neutrophils: 62 %
Platelets: 367 10*3/uL (ref 150–450)
RBC: 3.77 x10E6/uL (ref 3.77–5.28)
RDW: 13.5 % (ref 11.7–15.4)
WBC: 6 10*3/uL (ref 3.4–10.8)

## 2019-05-01 LAB — CMP14+EGFR
ALT: 6 IU/L (ref 0–32)
AST: 9 IU/L (ref 0–40)
Albumin/Globulin Ratio: 1.6 (ref 1.2–2.2)
Albumin: 4.2 g/dL (ref 3.7–4.7)
Alkaline Phosphatase: 110 IU/L (ref 39–117)
BUN/Creatinine Ratio: 13 (ref 12–28)
BUN: 14 mg/dL (ref 8–27)
Bilirubin Total: 0.3 mg/dL (ref 0.0–1.2)
CO2: 21 mmol/L (ref 20–29)
Calcium: 9.4 mg/dL (ref 8.7–10.3)
Chloride: 101 mmol/L (ref 96–106)
Creatinine, Ser: 1.1 mg/dL — ABNORMAL HIGH (ref 0.57–1.00)
GFR calc Af Amer: 58 mL/min/{1.73_m2} — ABNORMAL LOW (ref >59)
GFR calc non Af Amer: 51 mL/min/{1.73_m2} — ABNORMAL LOW (ref >59)
Globulin, Total: 2.7 g/dL (ref 1.5–4.5)
Glucose: 88 mg/dL (ref 65–99)
Potassium: 4.6 mmol/L (ref 3.5–5.2)
Sodium: 138 mmol/L (ref 134–144)
Total Protein: 6.9 g/dL (ref 6.0–8.5)

## 2019-05-01 LAB — T3, FREE: T3, Free: 2.9 pg/mL (ref 2.0–4.4)

## 2019-05-02 NOTE — Progress Notes (Signed)
Hello [name],  Your lab result is normal.Some minor variations that are not significant are commonly marked abnormal, but do not represent any medical problem for you.  Best regards, Claretta Fraise, M.D.

## 2019-05-07 ENCOUNTER — Other Ambulatory Visit: Payer: Self-pay | Admitting: Family Medicine

## 2019-06-02 ENCOUNTER — Other Ambulatory Visit: Payer: Self-pay

## 2019-06-02 ENCOUNTER — Encounter: Payer: Self-pay | Admitting: Family Medicine

## 2019-06-02 ENCOUNTER — Ambulatory Visit (INDEPENDENT_AMBULATORY_CARE_PROVIDER_SITE_OTHER): Payer: Medicare Other | Admitting: Family Medicine

## 2019-06-02 ENCOUNTER — Other Ambulatory Visit: Payer: Self-pay | Admitting: Family Medicine

## 2019-06-02 VITALS — BP 143/85 | HR 88 | Temp 98.2°F | Ht 69.0 in | Wt 160.0 lb

## 2019-06-02 DIAGNOSIS — I10 Essential (primary) hypertension: Secondary | ICD-10-CM

## 2019-06-02 MED ORDER — OLMESARTAN MEDOXOMIL-HCTZ 40-25 MG PO TABS: 1.0000 | ORAL_TABLET | Freq: Every day | ORAL | 2 refills | Status: DC

## 2019-06-02 NOTE — Progress Notes (Signed)
Subjective:  Patient ID: [name], female    DOB: November 08, 1946  Age: 73 y.o. MRN: [id number]  CC: Hypertension   HPI BRAEDEN DOLINSKI presents for  follow-up of hypertension. Patient has no history of headache chest pain or shortness of breath or recent cough. Patient also denies symptoms of TIA such as focal numbness or weakness. Patient denies side effects from medication. States taking it regularly. Readings at home running 263-785 systolic.   History Pasc[place]a has a past medical history of Alcoholism ([place]) (01/09/2012), Anemia, Anxiety, B12 deficiency, Carotid artery narrowing (10/03/2012), Chronic kidney disease (CKD), stage II (mild), COPD (chronic obstructive pulmonary disease) ([place]), Depression, Headache(784.0), Hyperlipidemia, Hypertension, Hyponatremia (01/09/2012), Hypothyroid, Menopause, Mini stroke ([place]), Orthostatic syncope (10/02/2012), Pneumonia, SBO (small bowel obstruction) ([place]) (06/28/2016), Shortness of breath, and Stroke ([place]) (2010).   She has a past surgical history that includes Appendectomy; Tonsillectomy; Cataract extraction w/PHACO (06/20/2012); Cataract extraction w/PHACO (07/11/2012); Colonoscopy (N/A, 12/17/2015); Esophagogastroduodenoscopy (N/A, 12/17/2015); Agile capsule (N/A, 02/22/2016); Knee arthroscopy (Right, 09/2016); and Lumbar laminectomy/decompression microdiscectomy (N/A, 05/25/2017).   Her family history includes GER disease in her mother; Hypertension in her maternal grandmother; Ulcers in her mother.She reports that she quit smoking about 26 years ago. Her smoking use included cigarettes. She has a 60.00 pack-year smoking history. She has never used smokeless tobacco. She reports that she does not drink alcohol or use drugs.  Current Outpatient Medications on File Prior to Visit  Medication Sig Dispense Refill  . acetaminophen (TYLENOL) 500 MG tablet Take 1 tablet (500 mg total) by mouth 2 (two) times daily. Pain    . alendronate (FOSAMAX) 70 MG tablet Take 70  mg by mouth once a week. Take with a full glass of water on an empty stomach.    Marland Kitchen aspirin EC 81 MG tablet Take 81 mg by mouth daily.    . Coenzyme Q10 (CO Q-10) 100 MG CAPS Take 100 mg by mouth daily. 885 each 10  . folic acid (FOLVITE) 027 MCG tablet Take 2,000 mcg by mouth daily.    Marland Kitchen levothyroxine (SYNTHROID, LEVOTHROID) 112 MCG tablet Take 1 tablet (112 mcg total) by mouth daily. 90 tablet 1  . liothyronine (CYTOMEL) 5 MCG tablet Take 2 tablets (10 mcg total) by mouth daily. 180 tablet 1  . nabumetone (RELAFEN) 500 MG tablet TAKE 2 TABLETS (1,000 MG TOTAL) BY MOUTH 2 (TWO) TIMES DAILY FOR MUSCLE AND JOINT PAIN 360 tablet 1  . olmesartan (BENICAR) 40 MG tablet Take 1 tablet (40 mg total) by mouth daily. 90 tablet 1  . ondansetron (ZOFRAN) 4 MG tablet Take 1 tablet (4 mg total) by mouth every 6 (six) hours as needed for nausea. 20 tablet 0  . rosuvastatin (CRESTOR) 10 MG tablet TAKE 1 TABLET EVERY DAY 90 tablet 1  . thiamine (VITAMIN B-1) 100 MG tablet Take 1 tablet (100 mg total) by mouth daily. 90 tablet 3   No current facility-administered medications on file prior to visit.     ROS Review of Systems  Constitutional: Negative.   HENT: Negative.   Eyes: Negative for visual disturbance.  Respiratory: Negative for shortness of breath.   Cardiovascular: Negative for chest pain.  Gastrointestinal: Negative for abdominal pain.  Musculoskeletal: Negative for arthralgias.    Objective:  BP (!) 143/85   Pulse 88   Temp 98.2 F (36.8 C) (Oral)   Ht 5\' 9"  (1.753 m)   Wt 160 lb (72.6 kg)   LMP 08/21/2013   BMI 23.63 kg/m  BP Readings from Last 3 Encounters:  06/02/19 (!) 143/85  04/30/19 (!) 183/99  04/22/19 (!) 174/81    Wt Readings from Last 3 Encounters:  06/02/19 160 lb (72.6 kg)  04/30/19 161 lb (73 kg)  04/21/19 160 lb 3.2 oz (72.7 kg)     Physical Exam Constitutional:      General: She is not in acute distress.    Appearance: She is well-developed.   Cardiovascular:     Rate and Rhythm: Normal rate and regular rhythm.  Pulmonary:     Breath sounds: Normal breath sounds.  Skin:    General: Skin is warm and dry.  Neurological:     Mental Status: She is alert and oriented to person, place, and time.       Assessment & Plan:   Dyanna was seen today for hypertension.  Diagnoses and all orders for this visit:  Essential hypertension  Other orders -     olmesartan-hydrochlorothiazide (BENICAR HCT) 40-25 MG tablet; Take 1 tablet by mouth daily.   Allergies as of 06/02/2019      Reactions   Ciprofloxacin Other (See Comments)   Caused pt to have delusional thoughts and hallucinations   Procardia [nifedipine] Swelling   Edema    Norvasc [amlodipine Besylate] Hives      Medication List       Accurate as of June 02, 2019  6:24 PM. If you have any questions, ask your nurse or doctor.        acetaminophen 500 MG tablet Commonly known as: TYLENOL Take 1 tablet (500 mg total) by mouth 2 (two) times daily. Pain   alendronate 70 MG tablet Commonly known as: FOSAMAX Take 70 mg by mouth once a week. Take with a full glass of water on an empty stomach.   aspirin EC 81 MG tablet Take 81 mg by mouth daily.   Co Q-10 100 MG Caps Take 100 mg by mouth daily.   folic acid 811 MCG tablet Commonly known as: FOLVITE Take 2,000 mcg by mouth daily.   levothyroxine 112 MCG tablet Commonly known as: SYNTHROID Take 1 tablet (112 mcg total) by mouth daily.   liothyronine 5 MCG tablet Commonly known as: Cytomel Take 2 tablets (10 mcg total) by mouth daily.   nabumetone 500 MG tablet Commonly known as: RELAFEN TAKE 2 TABLETS (1,000 MG TOTAL) BY MOUTH 2 (TWO) TIMES DAILY FOR MUSCLE AND JOINT PAIN   olmesartan 40 MG tablet Commonly known as: Benicar Take 1 tablet (40 mg total) by mouth daily.   olmesartan-hydrochlorothiazide 40-25 MG tablet Commonly known as: Benicar HCT Take 1 tablet by mouth daily. Started by: Claretta Fraise, MD   ondansetron 4 MG tablet Commonly known as: ZOFRAN Take 1 tablet (4 mg total) by mouth every 6 (six) hours as needed for nausea.   rosuvastatin 10 MG tablet Commonly known as: CRESTOR TAKE 1 TABLET EVERY DAY   thiamine 100 MG tablet Commonly known as: VITAMIN B-1 Take 1 tablet (100 mg total) by mouth daily.       Meds ordered this encounter  Medications  . olmesartan-hydrochlorothiazide (BENICAR HCT) 40-25 MG tablet    Sig: Take 1 tablet by mouth daily.    Dispense:  30 tablet    Refill:  2     Follow-up: Return in about 6 weeks (around 07/14/2019).  Claretta Fraise, M.D.

## 2019-06-02 NOTE — Patient Instructions (Signed)
Follow-up in 6 weeks

## 2019-06-30 ENCOUNTER — Encounter: Payer: Medicare Other | Admitting: *Deleted

## 2019-07-15 ENCOUNTER — Encounter: Payer: Self-pay | Admitting: Family Medicine

## 2019-07-15 ENCOUNTER — Ambulatory Visit (INDEPENDENT_AMBULATORY_CARE_PROVIDER_SITE_OTHER): Payer: Medicare Other | Admitting: Family Medicine

## 2019-07-15 DIAGNOSIS — E038 Other specified hypothyroidism: Secondary | ICD-10-CM

## 2019-07-15 DIAGNOSIS — I10 Essential (primary) hypertension: Secondary | ICD-10-CM

## 2019-07-15 DIAGNOSIS — E871 Hypo-osmolality and hyponatremia: Secondary | ICD-10-CM | POA: Diagnosis not present

## 2019-07-15 NOTE — Progress Notes (Signed)
Subjective:    Patient ID: [name], female    DOB: 1946-01-27, 73 y.o.   MRN: [id number]   HPI: [name] is a 73 y.o. female presenting for recheck of recent illness as well as thyroid and blood pressure.Feeling good now. ack to normal since cytomel was increased. Denies hair loss, and myxedema.  No excessive constipation.  No sensation of excessively being cold or hot.  Energy is good.   Follow-up of hypertension. Patient has no history of headache chest pain or shortness of breath or recent cough. Patient also denies symptoms of TIA such as numbness weakness lateralizing. Patient checks  blood pressure at home from time to time but currently her monitor is not working.  It will only take her pulse.  Patient denies side effects from his medication. States taking it regularly.     Depression screen [facility] 2/9 06/02/2019 04/30/2019 12/27/2018 12/13/2018 09/25/2018  Decreased Interest 0 0 1 1 0  Down, Depressed, Hopeless 0 0 0 0 0  PHQ - 2 Score 0 0 1 1 0  Altered sleeping 0 0 - - -  Tired, decreased energy 0 0 - - -  Change in appetite 0 0 - - -  Feeling bad or failure about yourself  0 0 - - -  Trouble concentrating 0 0 - - -  Moving slowly or fidgety/restless 0 0 - - -  Suicidal thoughts 0 0 - - -  PHQ-9 Score 0 0 - - -     Relevant past medical, surgical, family and social history reviewed and updated as indicated.  Interim medical history since our last visit reviewed. Allergies and medications reviewed and updated.  ROS:  Review of Systems  Constitutional: Negative.   HENT: Negative for congestion.   Eyes: Negative for visual disturbance.  Respiratory: Negative for shortness of breath.   Cardiovascular: Negative for chest pain.  Gastrointestinal: Negative for abdominal pain, constipation, diarrhea, nausea and vomiting.  Genitourinary: Negative for difficulty urinating.  Musculoskeletal: Negative for arthralgias and myalgias.  Neurological: Negative for  headaches.  Psychiatric/Behavioral: Negative for sleep disturbance.     Social History   Tobacco Use  Smoking Status Former Smoker  . Packs/day: 2.00  . Years: 30.00  . Pack years: 60.00  . Types: Cigarettes  . Quit date: 06/17/1992  . Years since quitting: 27.0  Smokeless Tobacco Never Used       Objective:     Wt Readings from Last 3 Encounters:  06/02/19 160 lb (72.6 kg)  04/30/19 161 lb (73 kg)  04/21/19 160 lb 3.2 oz (72.7 kg)     Exam deferred. Pt. Harboring due to COVID 19. Phone visit performed.   Assessment & Plan:   1. Other specified hypothyroidism   2. Hyponatremia   3. Essential hypertension     No orders of the defined types were placed in this encounter.   Orders Placed This Encounter  Procedures  . CMP14+EGFR    Order Specific Question:   Has the patient fasted?    Answer:   Yes  . TSH + free T4  . T3, free      Diagnoses and all orders for this visit:  Other specified hypothyroidism -     CMP14+EGFR -     TSH + free T4 -     T3, free  Hyponatremia -     CMP14+EGFR  Essential hypertension -     CMP14+EGFR    Virtual Visit via telephone  Note  I discussed the limitations, risks, security and privacy concerns of performing an evaluation and management service by telephone and the availability of in person appointments. The patient was identified with two identifiers. Pt.expressed understanding and agreed to proceed. Pt. Is at home. Dr. Livia Snellen is in his office.  Follow Up Instructions:   I discussed the assessment and treatment plan with the patient. The patient was provided an opportunity to ask questions and all were answered. The patient agreed with the plan and demonstrated an understanding of the instructions.   The patient was advised to call back or seek an in-person evaluation if the symptoms worsen or if the condition fails to improve as anticipated.   Total minutes including chart review and phone contact time: 24    Follow up plan: Return in about 6 months (around 01/15/2020) for Hypothyroidism.  Claretta Fraise, MD [place]

## 2019-07-16 ENCOUNTER — Other Ambulatory Visit: Payer: Self-pay

## 2019-07-16 ENCOUNTER — Other Ambulatory Visit: Payer: Medicare Other

## 2019-07-16 ENCOUNTER — Telehealth: Payer: Self-pay | Admitting: Family Medicine

## 2019-07-16 DIAGNOSIS — I10 Essential (primary) hypertension: Secondary | ICD-10-CM | POA: Diagnosis not present

## 2019-07-16 DIAGNOSIS — E038 Other specified hypothyroidism: Secondary | ICD-10-CM | POA: Diagnosis not present

## 2019-07-16 DIAGNOSIS — E871 Hypo-osmolality and hyponatremia: Secondary | ICD-10-CM | POA: Diagnosis not present

## 2019-07-16 NOTE — Telephone Encounter (Signed)
Please contact the patient let her know that is a little high, but just decrease salt intake. No change in meds at this time.

## 2019-07-16 NOTE — Telephone Encounter (Signed)
Pt aware.

## 2019-07-17 LAB — CMP14+EGFR
ALT: 8 IU/L (ref 0–32)
AST: 9 IU/L (ref 0–40)
Albumin/Globulin Ratio: 1.8 (ref 1.2–2.2)
Albumin: 4.2 g/dL (ref 3.7–4.7)
Alkaline Phosphatase: 79 IU/L (ref 39–117)
BUN/Creatinine Ratio: 11 — ABNORMAL LOW (ref 12–28)
BUN: 10 mg/dL (ref 8–27)
Bilirubin Total: 0.3 mg/dL (ref 0.0–1.2)
CO2: 23 mmol/L (ref 20–29)
Calcium: 8.8 mg/dL (ref 8.7–10.3)
Chloride: 107 mmol/L — ABNORMAL HIGH (ref 96–106)
Creatinine, Ser: 0.94 mg/dL (ref 0.57–1.00)
GFR calc Af Amer: 71 mL/min/{1.73_m2} (ref >59)
GFR calc non Af Amer: 61 mL/min/{1.73_m2} (ref >59)
Globulin, Total: 2.4 g/dL (ref 1.5–4.5)
Glucose: 85 mg/dL (ref 65–99)
Potassium: 4.6 mmol/L (ref 3.5–5.2)
Sodium: 144 mmol/L (ref 134–144)
Total Protein: 6.6 g/dL (ref 6.0–8.5)

## 2019-07-17 LAB — TSH+FREE T4
Free T4: 1.42 ng/dL (ref 0.82–1.77)
TSH: 0.219 u[IU]/mL — ABNORMAL LOW (ref 0.450–4.500)

## 2019-07-17 LAB — T3, FREE: T3, Free: 2.3 pg/mL (ref 2.0–4.4)

## 2019-07-20 NOTE — Progress Notes (Signed)
Hello Juanetta,  Your lab result is normal and/or stable.Some minor variations that are not significant are commonly marked abnormal, but do not represent any medical problem for you.  Best regards, Jackelin Correia, M.D.

## 2019-07-22 ENCOUNTER — Telehealth: Payer: Self-pay | Admitting: Family Medicine

## 2019-07-22 NOTE — Chronic Care Management (AMB) (Signed)
Chronic Care Management   Note  07/22/2019 Name: [name] MRN: [id number] DOB: 1946/04/12  [name] is a 73 y.o. year old female who is a primary care patient of Stacks, Cletus Gash, MD. I reached out to Karenann Cai by phone today in response to a referral sent by [name]'s health plan.    [name] was given information about Chronic Care Management services today including:  1. CCM service includes personalized support from designated clinical staff supervised by her physician, including individualized plan of care and coordination with other care providers 2. 24/7 contact phone numbers for assistance for urgent and routine care needs. 3. Service will only be billed when office clinical staff spend 20 minutes or more in a month to coordinate care. 4. Only one practitioner may furnish and bill the service in a calendar month. 5. The patient may stop CCM services at any time (effective at the end of the month) by phone call to the office staff. 6. The patient will be responsible for cost sharing (co-pay) of up to 20% of the service fee (after annual deductible is met).  Patient agreed to services and verbal consent obtained.   Follow up plan: Telephone appointment with CCM team member scheduled for: 08/20/2019  [place]  [email or web address]   ??[phone]

## 2019-08-01 ENCOUNTER — Ambulatory Visit (INDEPENDENT_AMBULATORY_CARE_PROVIDER_SITE_OTHER): Payer: Medicare Other | Admitting: *Deleted

## 2019-08-01 DIAGNOSIS — Z0000 Encounter for general adult medical examination without abnormal findings: Secondary | ICD-10-CM

## 2019-08-01 NOTE — Progress Notes (Signed)
MEDICARE ANNUAL WELLNESS VISIT  08/01/2019  Telephone Visit Disclaimer This Medicare AWV was conducted by telephone due to national recommendations for restrictions regarding the COVID-19 Pandemic (e.g. social distancing).  I verified, using two identifiers, that I am speaking with [name] or their authorized healthcare agent. I discussed the limitations, risks, security, and privacy concerns of performing an evaluation and management service by telephone and the potential availability of an in-person appointment in the future. The patient expressed understanding and agreed to proceed.   Subjective:  [name] is a 73 y.o. female patient of Stacks, Cletus Gash, MD who had a Medicare Annual Wellness Visit today via telephone. [name] is Retired and lives with their son. she has 2 children. she reports that she is socially active and does interact with friends/family regularly. she is not physically active and enjoys being outside, doing word search puzzles and reading mystery puzzles.  Patient Care Team: Claretta Fraise, MD as PCP - General (Family Medicine) Gala Romney, Cristopher Estimable, MD as Consulting Physician (Gastroenterology) Latanya Maudlin, MD as Consulting Physician (Orthopedic Surgery) Ilean China, RN as Registered Nurse  Advanced Directives 08/01/2019 04/21/2019 04/21/2019 04/21/2019 09/18/2017 05/25/2017 05/18/2017  Does Patient Have a Medical Advance Directive? Yes Yes Yes;No Yes Yes Yes Yes  Type of Advance Directive Living will;Healthcare Power of [place];Living will [place];Living will [place];Living will [place];Living will [place];Living will  Does patient want to make changes to medical advance directive? No - Patient declined No - Patient declined - - No - Patient declined No - Patient declined -  Copy of [place] in  Chart? No - copy requested No - copy requested No - copy requested - No - copy requested Yes Yes  Would patient like information on creating a medical advance directive? - - - - - - -  Pre-existing out of facility DNR order (yellow form or pink MOST form) - - - - - - -    Hospital Utilization Over the Past 12 Months: # of hospitalizations or ER visits: 1 # of surgeries: 0  Review of Systems    Patient reports that her overall health is unchanged compared to last year.  Patient Reported Readings (BP, Pulse, CBG, Weight, etc) none  Review of Systems: History obtained from chart review  All other systems negative.  Pain Assessment Pain : No/denies pain     Current Medications & Allergies (verified) Allergies as of 08/01/2019      Reactions   Ciprofloxacin Other (See Comments)   Caused pt to have delusional thoughts and hallucinations   Procardia [nifedipine] Swelling   Edema    Norvasc [amlodipine Besylate] Hives      Medication List       Accurate as of August 01, 2019  9:39 AM. If you have any questions, ask your nurse or doctor.        acetaminophen 500 MG tablet Commonly known as: TYLENOL Take 1 tablet (500 mg total) by mouth 2 (two) times daily. Pain   alendronate 70 MG tablet Commonly known as: FOSAMAX Take 70 mg by mouth once a week. Take with a full glass of water on an empty stomach.   aspirin EC 81 MG tablet Take 81 mg by mouth daily.   Co Q-10 100 MG Caps Take 100 mg by mouth daily.   folic acid [id number] MCG tablet Commonly known  as: FOLVITE Take 2,000 mcg by mouth daily.   levothyroxine 112 MCG tablet Commonly known as: SYNTHROID Take 1 tablet (112 mcg total) by mouth daily.   liothyronine 5 MCG tablet Commonly known as: Cytomel Take 2 tablets (10 mcg total) by mouth daily.   nabumetone 500 MG tablet Commonly known as: RELAFEN TAKE 2 TABLETS (1,000 MG TOTAL) BY MOUTH 2 (TWO) TIMES DAILY FOR MUSCLE AND JOINT PAIN    olmesartan-hydrochlorothiazide 40-25 MG tablet Commonly known as: Benicar HCT Take 1 tablet by mouth daily.   ondansetron 4 MG tablet Commonly known as: ZOFRAN Take 1 tablet (4 mg total) by mouth every 6 (six) hours as needed for nausea.   rosuvastatin 10 MG tablet Commonly known as: CRESTOR TAKE 1 TABLET EVERY DAY   thiamine 100 MG tablet Commonly known as: VITAMIN B-1 Take 1 tablet (100 mg total) by mouth daily.       History (reviewed): Past Medical History:  Diagnosis Date  . Alcoholism ([place]) 01/09/2012  . Anemia   . Anxiety   . B12 deficiency   . Carotid artery narrowing 10/03/2012   On the right.  . Chronic kidney disease (CKD), stage II (mild)   . COPD (chronic obstructive pulmonary disease) ([place])   . Depression   . Headache(784.0)   . Hyperlipidemia   . Hypertension   . Hyponatremia 01/09/2012  . Hypothyroid   . Menopause   . Mini stroke ([place])   . Orthostatic syncope 10/02/2012  . Pneumonia   . SBO (small bowel obstruction) ([place]) 06/28/2016  . Shortness of breath   . Stroke [facility]) 2010   no deficits   Past Surgical History:  Procedure Laterality Date  . AGILE CAPSULE N/A 02/22/2016   dummy capsule remained in the distal ileum  . APPENDECTOMY    . CATARACT EXTRACTION W/PHACO  06/20/2012   Procedure: CATARACT EXTRACTION PHACO AND INTRAOCULAR LENS PLACEMENT (IOC);  Surgeon: Tonny Branch, MD;  Location: [facility];  Service: Ophthalmology;  Laterality: Right;  CDE:10.66  . CATARACT EXTRACTION W/PHACO  07/11/2012   Procedure: CATARACT EXTRACTION PHACO AND INTRAOCULAR LENS PLACEMENT (IOC);  Surgeon: Tonny Branch, MD;  Location: [facility];  Service: Ophthalmology;  Laterality: Left;  CDE: 12.69  . COLONOSCOPY N/A 12/17/2015   RMR: Marked circumferential ulceration of the ascending colon/Ileocecal valve ulceration with biopsy most consistent with NSAID injury versus inflammatory bowel disease, pancolonoc diverticulosis redundant colon  . ESOPHAGOGASTRODUODENOSCOPY N/A 12/17/2015    [id number] gastric polyp, biopsy benign  . KNEE ARTHROSCOPY Right 09/2016  . LUMBAR LAMINECTOMY/DECOMPRESSION MICRODISCECTOMY N/A 05/25/2017   Procedure: CENTRAL DECOMPRESSIVE LUMBAR LAMINECTOMY FOR SPINAL STENOSIS L3-L4, FORAMINOTOMY FOR THE L4 ROOT AND L5 ROOT;  Surgeon: Latanya Maudlin, MD;  Location: [facility];  Service: Orthopedics;  Laterality: N/A;  . TONSILLECTOMY     Family History  Problem Relation Age of Onset  . GER disease Mother   . Ulcers Mother   . Hypertension Maternal Grandmother    Social History   Socioeconomic History  . Marital status: Widowed    Spouse name: Not on file  . Number of children: 2  . Years of education: 48  . Highest education level: Some college, no degree  Occupational History  . 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 Financial resource strain: Not hard at all  . Food insecurity    Worry: Never true    Inability: Never true  . Transportation needs  Medical: No    Non-medical: No  Tobacco Use  . Smoking status: Former Smoker    Packs/day: 2.00    Years: 30.00    Pack years: 60.00    Types: Cigarettes    Quit date: 06/17/1992    Years since quitting: 27.1  . Smokeless tobacco: Never Used  Substance and Sexual Activity  . Alcohol use: No  . Drug use: No  . Sexual activity: Not Currently  Lifestyle  . Physical activity    Days per week: 0 days    Minutes per session: 0 min  . Stress: Not at all  Relationships  . Social connections    Talks on phone: More than three times a week    Gets together: More than three times a week    Attends religious service: More than 4 times per year    Active member of club or organization: Yes    Attends meetings of clubs or organizations: More than 4 times per year    Relationship status: Widowed  Other Topics Concern  . Not on file  Social History Narrative  . Not on file    Activities of Daily Living In your present state of  health, do you have any difficulty performing the following activities: 08/01/2019 04/21/2019  Hearing? N N  Vision? Y N  Comment near sited, states she needs to have her eye exam -  Difficulty concentrating or making decisions? N N  Walking or climbing stairs? Y N  Comment due to left knee pain-so she tries to avoid stairs -  Dressing or bathing? N N  Doing errands, shopping? N N  Preparing Food and eating ? N -  Using the Toilet? N -  In the past six months, have you accidently leaked urine? Y -  Comment when she can't get to the bathroom in time -  Do you have problems with loss of bowel control? N -  Managing your Medications? N -  Comment she does use a pillbox -  Managing your Finances? N -  Housekeeping or managing your Housekeeping? Y -  Comment sometimes depending on the pain in her left knee -  Some recent data might be hidden    Patient Education/ Literacy How often do you need to have someone help you when you read instructions, pamphlets, or other written materials from your doctor or pharmacy?: 1 - Never What is the last grade level you completed in school?: some college-no degree  Exercise Current Exercise Habits: The patient does not participate in regular exercise at present, Exercise limited by: orthopedic condition(s)  Diet Patient reports consuming 2-3 meals a day and 1 snack(s) a day Patient reports that her primary diet is: Regular Patient reports that she does have regular access to food.   Depression Screen PHQ 2/9 Scores 08/01/2019 06/02/2019 04/30/2019 12/27/2018 12/13/2018 09/25/2018 09/16/2018  PHQ - 2 Score 0 0 0 1 1 0 0  PHQ- 9 Score - 0 0 - - - -     Fall Risk Fall Risk  08/01/2019 06/02/2019 04/30/2019 12/27/2018 12/13/2018  Falls in the past year? 1 1 1 1 1   Number falls in past yr: 1 0 1 1 1   Injury with Fall? 0 1 0 0 0  Comment - - - - -  Risk Factor Category  - - - - -  Risk for fall due to : Impaired balance/gait - - - -  Risk for fall due to:  Comment - - - - -  Follow up Falls prevention discussed - - - -     Objective:  [name] seemed alert and oriented and she participated appropriately during our telephone visit.  Blood Pressure Weight BMI  BP Readings from Last 3 Encounters:  06/02/19 (!) 143/85  04/30/19 (!) 183/99  04/22/19 (!) 174/81   Wt Readings from Last 3 Encounters:  06/02/19 160 lb (72.6 kg)  04/30/19 161 lb (73 kg)  04/21/19 160 lb 3.2 oz (72.7 kg)   BMI Readings from Last 1 Encounters:  06/02/19 23.63 kg/m    *Unable to obtain current vital signs, weight, and BMI due to telephone visit type  Hearing/Vision  . [place] did not seem to have difficulty with hearing/understanding during the telephone conversation . Reports that she has not had a formal eye exam by an eye care professional within the past year . Reports that she has not had a formal hearing evaluation within the past year *Unable to fully assess hearing and vision during telephone visit type  Cognitive Function: 6CIT Screen 08/01/2019  What Year? 0 points  What month? 0 points  What time? 0 points  Count back from 20 0 points  Months in reverse 0 points  Repeat phrase 2 points  Total Score 2   (Normal:0-7, Significant for Dysfunction: >8)  Normal Cognitive Function Screening: Yes   Immunization & Health Maintenance Record Immunization History  Administered Date(s) Administered  . Influenza, High Dose Seasonal PF 09/23/2014, 09/11/2016, 09/05/2017, 09/16/2018  . Influenza,inj,Quad PF,6+ Mos 10/01/2013, 09/10/2015  . Pneumococcal Conjugate-13 05/12/2016  . Pneumococcal Polysaccharide-23 01/10/2012, 04/27/2015  . Tdap 12/14/2015    Health Maintenance  Topic Date Due  . INFLUENZA VACCINE  07/12/2019  . MAMMOGRAM  11/05/2020  . TETANUS/TDAP  12/13/2025  . COLONOSCOPY  12/16/2025  . DEXA SCAN  Completed  . Hepatitis C Screening  Completed  . PNA vac Low Risk Adult  Completed       Assessment  This is a routine  wellness examination for [name].  Health Maintenance: Due or Overdue Health Maintenance Due  Topic Date Due  . INFLUENZA VACCINE  07/12/2019    [name] does not need a referral for Community Assistance: Care Management:   no Social Work:    no Prescription Assistance:  no Nutrition/Diabetes Education:  no   Plan:  Personalized Goals Goals Addressed            This Visit's Progress   . DIET - INCREASE WATER INTAKE       Try to drink 6-8 glasses of water daily.      Personalized Health Maintenance & Screening Recommendations  Influenza vaccine Shingles vaccine  Lung Cancer Screening Recommended: no (Low Dose CT Chest recommended if Age 41-80 years, 30 pack-year currently smoking OR have quit w/in past 15 years) Hepatitis C Screening recommended: no HIV Screening recommended: no  Advanced Directives: Written information was not prepared per patient's request.  Referrals & Orders No orders of the defined types were placed in this encounter.   Follow-up Plan . Follow-up with Claretta Fraise, MD as planned . Schedule your yearly eye exam as discussed . Consider Flu and Shingles vaccines at your next visit with your PCP   I have personally reviewed and noted the following in the patient's chart:   . Medical and social history . Use of alcohol, tobacco or illicit drugs  . Current medications and supplements . Functional ability and status . Nutritional status . Physical activity .  Advanced directives . List of other physicians . Hospitalizations, surgeries, and ER visits in previous 12 months . Vitals . Screenings to include cognitive, depression, and falls . Referrals and appointments  In addition, I have reviewed and discussed with [name] certain preventive protocols, quality metrics, and best practice recommendations. A written personalized care plan for preventive services as well as general preventive health recommendations is available  and can be mailed to the patient at her request.      Marylin Crosby, LPN  [id number]

## 2019-08-01 NOTE — Patient Instructions (Signed)
Preventive Care 38 Years and Older, Female Preventive care refers to lifestyle choices and visits with your health care provider that can promote health and wellness. This includes:  A yearly physical exam. This is also called an annual well check.  Regular dental and eye exams.  Immunizations.  Screening for certain conditions.  Healthy lifestyle choices, such as diet and exercise. What can I expect for my preventive care visit? Physical exam Your health care provider will check:  Height and weight. These may be used to calculate body mass index (BMI), which is a measurement that tells if you are at a healthy weight.  Heart rate and blood pressure.  Your skin for abnormal spots. Counseling Your health care provider may ask you questions about:  Alcohol, tobacco, and drug use.  Emotional well-being.  Home and relationship well-being.  Sexual activity.  Eating habits.  History of falls.  Memory and ability to understand (cognition).  Work and work Statistician.  Pregnancy and menstrual history. What immunizations do I need?  Influenza (flu) vaccine  This is recommended every year. Tetanus, diphtheria, and pertussis (Tdap) vaccine  You may need a Td booster every 10 years. Varicella (chickenpox) vaccine  You may need this vaccine if you have not already been vaccinated. Zoster (shingles) vaccine  You may need this after age 33. Pneumococcal conjugate (PCV13) vaccine  One dose is recommended after age 33. Pneumococcal polysaccharide (PPSV23) vaccine  One dose is recommended after age 72. Measles, mumps, and rubella (MMR) vaccine  You may need at least one dose of MMR if you were born in 1957 or later. You may also need a second dose. Meningococcal conjugate (MenACWY) vaccine  You may need this if you have certain conditions. Hepatitis A vaccine  You may need this if you have certain conditions or if you travel or work in places where you may be exposed  to hepatitis A. Hepatitis B vaccine  You may need this if you have certain conditions or if you travel or work in places where you may be exposed to hepatitis B. Haemophilus influenzae type b (Hib) vaccine  You may need this if you have certain conditions. You may receive vaccines as individual doses or as more than one vaccine together in one shot (combination vaccines). Talk with your health care provider about the risks and benefits of combination vaccines. What tests do I need? Blood tests  Lipid and cholesterol levels. These may be checked every 5 years, or more frequently depending on your overall health.  Hepatitis C test.  Hepatitis B test. Screening  Lung cancer screening. You may have this screening every year starting at age 39 if you have a 30-pack-year history of smoking and currently smoke or have quit within the past 15 years.  Colorectal cancer screening. All adults should have this screening starting at age 36 and continuing until age 15. Your health care provider may recommend screening at age 23 if you are at increased risk. You will have tests every 1-10 years, depending on your results and the type of screening test.  Diabetes screening. This is done by checking your blood sugar (glucose) after you have not eaten for a while (fasting). You may have this done every 1-3 years.  Mammogram. This may be done every 1-2 years. Talk with your health care provider about how often you should have regular mammograms.  BRCA-related cancer screening. This may be done if you have a family history of breast, ovarian, tubal, or peritoneal cancers.  Other tests  Sexually transmitted disease (STD) testing.  Bone density scan. This is done to screen for osteoporosis. You may have this done starting at age 76. Follow these instructions at home: Eating and drinking  Eat a diet that includes fresh fruits and vegetables, whole grains, lean protein, and low-fat dairy products. Limit  your intake of foods with high amounts of sugar, saturated fats, and salt.  Take vitamin and mineral supplements as recommended by your health care provider.  Do not drink alcohol if your health care provider tells you not to drink.  If you drink alcohol: ? Limit how much you have to 0-1 drink a day. ? Be aware of how much alcohol is in your drink. In the [place] one drink equals one 12 oz bottle of beer (355 mL), one 5 oz glass of wine (148 mL), or one 1 oz glass of hard liquor (44 mL). Lifestyle  Take daily care of your teeth and gums.  Stay active. Exercise for at least 30 minutes on 5 or more days each week.  Do not use any products that contain nicotine or tobacco, such as cigarettes, e-cigarettes, and chewing tobacco. If you need help quitting, ask your health care provider.  If you are sexually active, practice safe sex. Use a condom or other form of protection in order to prevent STIs (sexually transmitted infections).  Talk with your health care provider about taking a low-dose aspirin or statin. What's next?  Go to your health care provider once a year for a well check visit.  Ask your health care provider how often you should have your eyes and teeth checked.  Stay up to date on all vaccines. This information is not intended to replace advice given to you by your health care provider. Make sure you discuss any questions you have with your health care provider. Document Released: 12/24/2015 Document Revised: 11/21/2018 Document Reviewed: 11/21/2018 Elsevier Patient Education  2020 [employer].

## 2019-08-19 ENCOUNTER — Telehealth: Payer: Self-pay | Admitting: Family Medicine

## 2019-08-19 MED ORDER — DULOXETINE HCL 60 MG PO CPEP: 60.0000 mg | ORAL_CAPSULE | Freq: Every day | ORAL | 1 refills | Status: DC

## 2019-08-19 NOTE — Telephone Encounter (Signed)
Please refill the 60 mg for 6 mos. Thanks, WS

## 2019-08-19 NOTE — Telephone Encounter (Signed)
Pt has ran out of cymbalta -gets from  [place]. She thinks she may have caused the confusion.  In May she told the nurse that she was no longer taking it and it was removed from her Epic med list. She was confused on what it was for.  She only has a 30 mg RX that she found in the cabinet.  She has been taking the 60 mg up until yesterday when she ran out.  What should she do?  Is it ok to refill the 60 mg - since she technically never stopped taking it?

## 2019-08-19 NOTE — Telephone Encounter (Signed)
sent med - pt aware

## 2019-08-20 ENCOUNTER — Ambulatory Visit (INDEPENDENT_AMBULATORY_CARE_PROVIDER_SITE_OTHER): Payer: Medicare Other | Admitting: *Deleted

## 2019-08-20 DIAGNOSIS — M1990 Unspecified osteoarthritis, unspecified site: Secondary | ICD-10-CM | POA: Insufficient documentation

## 2019-08-20 NOTE — Patient Instructions (Signed)
Visit Information  Goals Addressed            This Visit's Progress     Patient Stated   . "I would like to be able to get my knee fixed" (pt-stated)       Current Barriers:  . Film/video editor.  . [facility] $200 and they need payment in full before scheduling another surgery  Nurse Case Manager Clinical Goal(s):  Marland Kitchen Over the next 30 days, patient will meet with RN Care Manager to address left knee pain.  Interventions:  . Evaluation of current treatment plan related to chronic knee pain and patient's adherence to plan as established by provider. . Discussed plans with patient for ongoing care management follow up and provided patient with direct contact information for care management team . Chart reviewed-imaging study noted meniscal tear and arthritis several years ago. Underwent subsequent arthroscopy.  . Discussed HPI of chronic knee pain . Recommended conservative pain management methods . Recommended a brace for stability while walking . Suggested assistive devices, like a cane, to help with balance and stability  Patient Self Care Activities:  . Performs ADL's independently . Performs IADL's independently  Initial goal documentation     . "I'd like to prevent falls" (pt-stated)       Current Barriers:  . Chronic knee pain an instability  Nurse Case Manager Clinical Goal(s):  Marland Kitchen Over the next 30 days, patient will meet with RN Care Manager to address recurrent falls  Interventions:  . Advised patient to move carefully and change positions slowly to avoid falls . Provided patient with printed educational materials related to falls prevention . Fall screening done . Discussed fall prevention measures that can be done in the home . Recommended cane or other assistive device for stability   Patient Self Care Activities:  . Performs ADL's independently . Performs IADL's independently  Initial goal documentation        The patient verbalized  understanding of instructions provided today and declined a print copy of patient instruction materials.    [name] was given information about Chronic Care Management services today including:  1. CCM service includes personalized support from designated clinical staff supervised by her physician, including individualized plan of care and coordination with other care providers 2. 24/7 contact phone numbers for assistance for urgent and routine care needs. 3. Service will only be billed when office clinical staff spend 20 minutes or more in a month to coordinate care. 4. Only one practitioner may furnish and bill the service in a calendar month. 5. The patient may stop CCM services at any time (effective at the end of the month) by phone call to the office staff. 6. The patient will be responsible for cost sharing (co-pay) of up to 20% of the service fee (after annual deductible is met).  Patient agreed to services and verbal consent obtained.       [name] was provided with CCM contact information and will reach out with any health concerns  The care management team will reach out to the patient again over the next 30 days.    Chong Sicilian BSN, RN-BC Embedded Chronic Care Manager [facility] [place] [facility] Direct Dial: [phone]

## 2019-08-20 NOTE — Chronic Care Management (AMB) (Signed)
Chronic Care Management   Initial Visit Note  08/20/2019 Name: [name] MRN: [id number] DOB: 1946/07/31  Referred by: Claretta Fraise, MD Reason for referral : Chronic Care Management (RNCM Initial Visit)   [name] is a 73 y.o. year old female who is a primary care patient of Stacks, Cletus Gash, MD. The CCM team was consulted for assistance with chronic disease management and care coordination needs.   Review of patient status, including review of consultants reports, relevant laboratory and other test results, and collaboration with appropriate care team members and the patient's provider was performed as part of comprehensive patient evaluation and provision of chronic care management services.    SDOH (Social Determinants of Health) screening performed today: Physical Activity. See Care Plan for related entries.   Subjective: "I really wish I could get this knee fixed" I spoke with [name] by telephone today. She is in need of left knee surgery but says that she owes [employer] and they will not schedule another procedure until the balance is paid. We also discussed fall risk and prevention which is a relevant concern to her primary issue. [name] lives at home and her adult son lives with her. She is able to take care of herself and manages her ADLs and IADLs on her own. She does not have any problems with access to food, transportation, medication, or housing.   Objective:  Outpatient Encounter Medications as of 08/20/2019  Medication Sig  . acetaminophen (TYLENOL) 500 MG tablet Take 1 tablet (500 mg total) by mouth 2 (two) times daily. Pain  . alendronate (FOSAMAX) 70 MG tablet Take 70 mg by mouth once a week. Take with a full glass of water on an empty stomach.  Marland Kitchen aspirin EC 81 MG tablet Take 81 mg by mouth daily.  . Coenzyme Q10 (CO Q-10) 100 MG CAPS Take 100 mg by mouth daily.  . DULoxetine (CYMBALTA) 60 MG capsule Take 1 capsule (60 mg total) by mouth  daily.  . folic acid (FOLVITE) Q000111Q MCG tablet Take 2,000 mcg by mouth daily.  Marland Kitchen levothyroxine (SYNTHROID, LEVOTHROID) 112 MCG tablet Take 1 tablet (112 mcg total) by mouth daily.  Marland Kitchen liothyronine (CYTOMEL) 5 MCG tablet Take 2 tablets (10 mcg total) by mouth daily.  . nabumetone (RELAFEN) 500 MG tablet TAKE 2 TABLETS (1,000 MG TOTAL) BY MOUTH 2 (TWO) TIMES DAILY FOR MUSCLE AND JOINT PAIN  . olmesartan-hydrochlorothiazide (BENICAR HCT) 40-25 MG tablet Take 1 tablet by mouth daily.  . ondansetron (ZOFRAN) 4 MG tablet Take 1 tablet (4 mg total) by mouth every 6 (six) hours as needed for nausea.  . rosuvastatin (CRESTOR) 10 MG tablet TAKE 1 TABLET EVERY DAY  . thiamine (VITAMIN B-1) 100 MG tablet Take 1 tablet (100 mg total) by mouth daily.   No facility-administered encounter medications on file as of 08/20/2019.     Fall Risk  08/20/2019 08/01/2019 06/02/2019 04/30/2019 12/27/2018  Falls in the past year? 1 1 1 1 1   Number falls in past yr: 1 1 0 1 1  Injury with Fall? 0 0 1 0 0  Comment - - - - -  Risk Factor Category  - - - - -  Risk for fall due to : Impaired mobility;Impaired balance/gait;History of fall(s) Impaired balance/gait - - -  Risk for fall due to: Comment - - - - -  Follow up Falls prevention discussed Falls prevention discussed - - -     Assessment & Plan  Patient Stated   . "I would like to be able to get my knee fixed" (pt-stated)       Current Barriers:  . Film/video editor.  . Owes [facility] $200 and they need payment in full before scheduling another surgery  Nurse Case Manager Clinical Goal(s):  Marland Kitchen Over the next 30 days, patient will meet with RN Care Manager to address left knee pain.  Interventions:  . Evaluation of current treatment plan related to chronic knee pain and patient's adherence to plan as established by provider. . Discussed plans with patient for ongoing care management follow up and provided patient with direct contact information for care  management team . Chart reviewed-imaging study noted meniscal tear and arthritis several years ago. Underwent subsequent arthroscopy.  . Discussed HPI of chronic knee pain . Recommended conservative pain management methods . Recommended a brace for stability while walking . Suggested assistive devices, like a cane, to help with balance and stability  Patient Self Care Activities:  . Performs ADL's independently . Performs IADL's independently  Initial goal documentation     . "I'd like to prevent falls" (pt-stated)       Current Barriers:  . Chronic knee pain an instability  Nurse Case Manager Clinical Goal(s):  Marland Kitchen Over the next 30 days, patient will meet with RN Care Manager to address recurrent falls  Interventions:  . Advised patient to move carefully and change positions slowly to avoid falls . Provided patient with printed educational materials related to falls prevention . Fall screening done . Discussed fall prevention measures that can be done in the home . Recommended cane or other assistive device for stability   Patient Self Care Activities:  . Performs ADL's independently . Performs IADL's independently  Initial goal documentation         [name] was provided with CCM contact information and will reach out with any health concerns  The care management team will reach out to the patient again over the next 30 days.    Chong Sicilian BSN, RN-BC Embedded Chronic Care Manager [facility] Direct Dial: [phone]

## 2019-08-25 ENCOUNTER — Other Ambulatory Visit: Payer: Self-pay | Admitting: *Deleted

## 2019-08-25 MED ORDER — OLMESARTAN MEDOXOMIL-HCTZ 40-25 MG PO TABS: 1.0000 | ORAL_TABLET | Freq: Every day | ORAL | 1 refills | Status: DC

## 2019-09-10 ENCOUNTER — Other Ambulatory Visit: Payer: Self-pay

## 2019-09-11 ENCOUNTER — Ambulatory Visit: Payer: Medicare Other

## 2019-09-12 ENCOUNTER — Other Ambulatory Visit: Payer: Self-pay | Admitting: *Deleted

## 2019-09-12 DIAGNOSIS — Z20822 Contact with and (suspected) exposure to covid-19: Secondary | ICD-10-CM

## 2019-09-14 LAB — NOVEL CORONAVIRUS, NAA: SARS-CoV-2, NAA: NOT DETECTED

## 2019-09-26 ENCOUNTER — Other Ambulatory Visit: Payer: Self-pay

## 2019-09-29 ENCOUNTER — Ambulatory Visit (INDEPENDENT_AMBULATORY_CARE_PROVIDER_SITE_OTHER): Payer: Medicare Other

## 2019-09-29 ENCOUNTER — Other Ambulatory Visit: Payer: Self-pay

## 2019-09-29 DIAGNOSIS — Z23 Encounter for immunization: Secondary | ICD-10-CM

## 2019-10-06 ENCOUNTER — Other Ambulatory Visit: Payer: Self-pay

## 2019-10-06 NOTE — Patient Outreach (Signed)
[facility]) Care Management  10/06/2019  AME MCNULTY 09/22/1946 [id number]   Medication Adherence call to Mrs. [place] Compliant Voice message left with a call back number. Mrs. Price is showing past due on Olmesartan/Hctz 40/25 mg under [place].   [facility] Direct Dial [phone]  Fax [phone] [email or web address]

## 2019-10-12 ENCOUNTER — Emergency Department (HOSPITAL_COMMUNITY): Payer: Medicare Other

## 2019-10-12 ENCOUNTER — Other Ambulatory Visit: Payer: Self-pay

## 2019-10-12 ENCOUNTER — Emergency Department (HOSPITAL_BASED_OUTPATIENT_CLINIC_OR_DEPARTMENT_OTHER): Payer: Medicare Other

## 2019-10-12 ENCOUNTER — Encounter (HOSPITAL_COMMUNITY): Payer: Self-pay | Admitting: Internal Medicine

## 2019-10-12 ENCOUNTER — Inpatient Hospital Stay (HOSPITAL_COMMUNITY)
Admission: EM | Admit: 2019-10-12 | Discharge: 2019-10-16 | DRG: 872 | Disposition: A | Discharge status: Home / Self Care | Payer: Medicare Other | Attending: Internal Medicine | Admitting: Internal Medicine

## 2019-10-12 ENCOUNTER — Other Ambulatory Visit (HOSPITAL_COMMUNITY): Payer: Self-pay

## 2019-10-12 ENCOUNTER — Inpatient Hospital Stay (HOSPITAL_COMMUNITY): Payer: Medicare Other

## 2019-10-12 DIAGNOSIS — N390 Urinary tract infection, site not specified: Secondary | ICD-10-CM | POA: Diagnosis present

## 2019-10-12 DIAGNOSIS — A419 Sepsis, unspecified organism: Secondary | ICD-10-CM

## 2019-10-12 DIAGNOSIS — E872 Acidosis, unspecified: Secondary | ICD-10-CM | POA: Diagnosis present

## 2019-10-12 DIAGNOSIS — R109 Unspecified abdominal pain: Secondary | ICD-10-CM | POA: Diagnosis not present

## 2019-10-12 DIAGNOSIS — N182 Chronic kidney disease, stage 2 (mild): Secondary | ICD-10-CM | POA: Diagnosis not present

## 2019-10-12 DIAGNOSIS — Z7983 Long term (current) use of bisphosphonates: Secondary | ICD-10-CM | POA: Diagnosis not present

## 2019-10-12 DIAGNOSIS — I10 Essential (primary) hypertension: Secondary | ICD-10-CM | POA: Diagnosis not present

## 2019-10-12 DIAGNOSIS — Z8673 Personal history of transient ischemic attack (TIA), and cerebral infarction without residual deficits: Secondary | ICD-10-CM

## 2019-10-12 DIAGNOSIS — D649 Anemia, unspecified: Secondary | ICD-10-CM | POA: Diagnosis present

## 2019-10-12 DIAGNOSIS — R0602 Shortness of breath: Secondary | ICD-10-CM | POA: Diagnosis not present

## 2019-10-12 DIAGNOSIS — Z888 Allergy status to other drugs, medicaments and biological substances status: Secondary | ICD-10-CM

## 2019-10-12 DIAGNOSIS — R7881 Bacteremia: Secondary | ICD-10-CM | POA: Diagnosis not present

## 2019-10-12 DIAGNOSIS — Z7982 Long term (current) use of aspirin: Secondary | ICD-10-CM | POA: Diagnosis not present

## 2019-10-12 DIAGNOSIS — E876 Hypokalemia: Secondary | ICD-10-CM | POA: Diagnosis not present

## 2019-10-12 DIAGNOSIS — N10 Acute tubulo-interstitial nephritis: Secondary | ICD-10-CM | POA: Diagnosis present

## 2019-10-12 DIAGNOSIS — A4151 Sepsis due to Escherichia coli [E. coli]: Secondary | ICD-10-CM | POA: Diagnosis not present

## 2019-10-12 DIAGNOSIS — E538 Deficiency of other specified B group vitamins: Secondary | ICD-10-CM | POA: Diagnosis present

## 2019-10-12 DIAGNOSIS — Z20828 Contact with and (suspected) exposure to other viral communicable diseases: Secondary | ICD-10-CM | POA: Diagnosis present

## 2019-10-12 DIAGNOSIS — E860 Dehydration: Secondary | ICD-10-CM | POA: Diagnosis present

## 2019-10-12 DIAGNOSIS — Z79890 Hormone replacement therapy (postmenopausal): Secondary | ICD-10-CM

## 2019-10-12 DIAGNOSIS — D631 Anemia in chronic kidney disease: Secondary | ICD-10-CM | POA: Diagnosis present

## 2019-10-12 DIAGNOSIS — I129 Hypertensive chronic kidney disease with stage 1 through stage 4 chronic kidney disease, or unspecified chronic kidney disease: Secondary | ICD-10-CM | POA: Diagnosis present

## 2019-10-12 DIAGNOSIS — K529 Noninfective gastroenteritis and colitis, unspecified: Secondary | ICD-10-CM | POA: Diagnosis present

## 2019-10-12 DIAGNOSIS — F329 Major depressive disorder, single episode, unspecified: Secondary | ICD-10-CM | POA: Diagnosis present

## 2019-10-12 DIAGNOSIS — Z8249 Family history of ischemic heart disease and other diseases of the circulatory system: Secondary | ICD-10-CM

## 2019-10-12 DIAGNOSIS — E038 Other specified hypothyroidism: Secondary | ICD-10-CM | POA: Diagnosis not present

## 2019-10-12 DIAGNOSIS — B9620 Unspecified Escherichia coli [E. coli] as the cause of diseases classified elsewhere: Secondary | ICD-10-CM | POA: Diagnosis not present

## 2019-10-12 DIAGNOSIS — N179 Acute kidney failure, unspecified: Secondary | ICD-10-CM

## 2019-10-12 DIAGNOSIS — Z87891 Personal history of nicotine dependence: Secondary | ICD-10-CM | POA: Diagnosis not present

## 2019-10-12 DIAGNOSIS — E039 Hypothyroidism, unspecified: Secondary | ICD-10-CM | POA: Diagnosis present

## 2019-10-12 DIAGNOSIS — D696 Thrombocytopenia, unspecified: Secondary | ICD-10-CM | POA: Diagnosis not present

## 2019-10-12 DIAGNOSIS — E785 Hyperlipidemia, unspecified: Secondary | ICD-10-CM | POA: Diagnosis not present

## 2019-10-12 DIAGNOSIS — J449 Chronic obstructive pulmonary disease, unspecified: Secondary | ICD-10-CM | POA: Diagnosis present

## 2019-10-12 DIAGNOSIS — D509 Iron deficiency anemia, unspecified: Secondary | ICD-10-CM | POA: Diagnosis not present

## 2019-10-12 DIAGNOSIS — Z881 Allergy status to other antibiotic agents status: Secondary | ICD-10-CM | POA: Diagnosis not present

## 2019-10-12 DIAGNOSIS — R509 Fever, unspecified: Secondary | ICD-10-CM | POA: Diagnosis not present

## 2019-10-12 DIAGNOSIS — R000 Tachycardia, unspecified: Secondary | ICD-10-CM | POA: Diagnosis not present

## 2019-10-12 DIAGNOSIS — E871 Hypo-osmolality and hyponatremia: Secondary | ICD-10-CM | POA: Diagnosis present

## 2019-10-12 DIAGNOSIS — E782 Mixed hyperlipidemia: Secondary | ICD-10-CM | POA: Diagnosis not present

## 2019-10-12 LAB — COMPREHENSIVE METABOLIC PANEL
ALT: 11 U/L (ref 0–44)
AST: 20 U/L (ref 15–41)
Albumin: 3.6 g/dL (ref 3.5–5.0)
Alkaline Phosphatase: 73 U/L (ref 38–126)
Anion gap: 13 (ref 5–15)
BUN: 17 mg/dL (ref 8–23)
CO2: 22 mmol/L (ref 22–32)
Calcium: 8.1 mg/dL — ABNORMAL LOW (ref 8.9–10.3)
Chloride: 93 mmol/L — ABNORMAL LOW (ref 98–111)
Creatinine, Ser: 1.34 mg/dL — ABNORMAL HIGH (ref 0.44–1.00)
GFR calc Af Amer: 46 mL/min — ABNORMAL LOW (ref >60)
GFR calc non Af Amer: 39 mL/min — ABNORMAL LOW (ref >60)
Glucose, Bld: 116 mg/dL — ABNORMAL HIGH (ref 70–99)
Potassium: 3 mmol/L — ABNORMAL LOW (ref 3.5–5.1)
Sodium: 128 mmol/L — ABNORMAL LOW (ref 135–145)
Total Bilirubin: 1.1 mg/dL (ref 0.3–1.2)
Total Protein: 7.2 g/dL (ref 6.5–8.1)

## 2019-10-12 LAB — URINALYSIS, ROUTINE W REFLEX MICROSCOPIC
Bilirubin Urine: NEGATIVE
Glucose, UA: NEGATIVE mg/dL
Ketones, ur: NEGATIVE mg/dL
Nitrite: POSITIVE — AB
Protein, ur: 100 mg/dL — AB
Specific Gravity, Urine: 1.011 (ref 1.005–1.030)
pH: 5 (ref 5.0–8.0)

## 2019-10-12 LAB — DIFFERENTIAL
Abs Immature Granulocytes: 0.11 10*3/uL — ABNORMAL HIGH (ref 0.00–0.07)
Basophils Absolute: 0 10*3/uL (ref 0.0–0.1)
Basophils Relative: 0 %
Eosinophils Absolute: 0 10*3/uL (ref 0.0–0.5)
Eosinophils Relative: 0 %
Immature Granulocytes: 1 %
Lymphocytes Relative: 7 %
Lymphs Abs: 0.7 10*3/uL (ref 0.7–4.0)
Monocytes Absolute: 1 10*3/uL (ref 0.1–1.0)
Monocytes Relative: 10 %
Neutro Abs: 8.2 10*3/uL — ABNORMAL HIGH (ref 1.7–7.7)
Neutrophils Relative %: 82 %

## 2019-10-12 LAB — LACTIC ACID, PLASMA: Lactic Acid, Venous: 2.5 mmol/L (ref 0.5–1.9)

## 2019-10-12 LAB — CBC
HCT: 34.1 % — ABNORMAL LOW (ref 36.0–46.0)
Hemoglobin: 11.1 g/dL — ABNORMAL LOW (ref 12.0–15.0)
MCH: 30.7 pg (ref 26.0–34.0)
MCHC: 32.6 g/dL (ref 30.0–36.0)
MCV: 94.5 fL (ref 80.0–100.0)
Platelets: ADEQUATE 10*3/uL (ref 150–400)
RBC: 3.61 MIL/uL — ABNORMAL LOW (ref 3.87–5.11)
RDW: 13.7 % (ref 11.5–15.5)
WBC: 10 10*3/uL (ref 4.0–10.5)
nRBC: 0 % (ref 0.0–0.2)

## 2019-10-12 LAB — PHOSPHORUS: Phosphorus: 2 mg/dL — ABNORMAL LOW (ref 2.5–4.6)

## 2019-10-12 LAB — LIPASE, BLOOD: Lipase: 17 U/L (ref 11–51)

## 2019-10-12 LAB — MAGNESIUM: Magnesium: 1.8 mg/dL (ref 1.7–2.4)

## 2019-10-12 MED ORDER — ROSUVASTATIN CALCIUM 10 MG PO TABS
10.0000 mg | ORAL_TABLET | Freq: Every day | ORAL | Status: DC
  Administered 2019-10-13 – 2019-10-16 (×4): 10 mg via ORAL
  Filled 2019-10-12 (×4): qty 1

## 2019-10-12 MED ORDER — ADENOSINE 6 MG/2ML IV SOLN
INTRAVENOUS | Status: AC
  Filled 2019-10-12: qty 10

## 2019-10-12 MED ORDER — LIOTHYRONINE SODIUM 5 MCG PO TABS
10.0000 ug | ORAL_TABLET | Freq: Every day | ORAL | Status: DC
  Administered 2019-10-13 – 2019-10-16 (×4): 10 ug via ORAL
  Filled 2019-10-12 (×5): qty 2

## 2019-10-12 MED ORDER — PROCHLORPERAZINE EDISYLATE 10 MG/2ML IJ SOLN: 5.0000 mg | INTRAMUSCULAR | Status: DC | PRN

## 2019-10-12 MED ORDER — ASPIRIN EC 81 MG PO TBEC
81.0000 mg | DELAYED_RELEASE_TABLET | Freq: Every day | ORAL | Status: DC
  Administered 2019-10-13 – 2019-10-16 (×4): 81 mg via ORAL
  Filled 2019-10-12 (×4): qty 1

## 2019-10-12 MED ORDER — LORAZEPAM 2 MG/ML IJ SOLN
1.0000 mg | Freq: Once | INTRAMUSCULAR | Status: AC
  Administered 2019-10-12: 1 mg via INTRAVENOUS

## 2019-10-12 MED ORDER — FOLIC ACID 1 MG PO TABS
2000.0000 ug | ORAL_TABLET | Freq: Every day | ORAL | Status: DC
  Administered 2019-10-13 – 2019-10-16 (×4): 2 mg via ORAL
  Filled 2019-10-12 (×4): qty 2

## 2019-10-12 MED ORDER — CHLORHEXIDINE GLUCONATE CLOTH 2 % EX PADS
6.0000 | MEDICATED_PAD | Freq: Every day | CUTANEOUS | Status: DC
  Administered 2019-10-13 – 2019-10-15 (×3): 6 via TOPICAL

## 2019-10-12 MED ORDER — POTASSIUM CHLORIDE IN NACL 40-0.9 MEQ/L-% IV SOLN
INTRAVENOUS | Status: AC
  Administered 2019-10-13: 250 mL/h via INTRAVENOUS
  Filled 2019-10-12 (×2): qty 1000

## 2019-10-12 MED ORDER — ORAL CARE MOUTH RINSE
15.0000 mL | Freq: Two times a day (BID) | OROMUCOSAL | Status: DC
  Administered 2019-10-13 – 2019-10-16 (×7): 15 mL via OROMUCOSAL

## 2019-10-12 MED ORDER — DULOXETINE HCL 60 MG PO CPEP
60.0000 mg | ORAL_CAPSULE | Freq: Every day | ORAL | Status: DC
  Administered 2019-10-13 – 2019-10-16 (×4): 60 mg via ORAL
  Filled 2019-10-12 (×4): qty 1

## 2019-10-12 MED ORDER — SODIUM CHLORIDE 0.9 % IV SOLN
1.0000 g | Freq: Once | INTRAVENOUS | Status: AC
  Administered 2019-10-12: 1 g via INTRAVENOUS
  Filled 2019-10-12: qty 10

## 2019-10-12 MED ORDER — IOHEXOL 300 MG/ML  SOLN: 100.0000 mL | Freq: Once | INTRAMUSCULAR | Status: DC | PRN

## 2019-10-12 MED ORDER — NABUMETONE 500 MG PO TABS
1000.0000 mg | ORAL_TABLET | Freq: Two times a day (BID) | ORAL | Status: DC | PRN
  Filled 2019-10-12: qty 2

## 2019-10-12 MED ORDER — ONDANSETRON HCL 4 MG/2ML IJ SOLN
4.0000 mg | Freq: Once | INTRAMUSCULAR | Status: AC
  Administered 2019-10-12: 4 mg via INTRAVENOUS
  Filled 2019-10-12: qty 2

## 2019-10-12 MED ORDER — LORAZEPAM 2 MG/ML IJ SOLN
INTRAMUSCULAR | Status: AC
  Administered 2019-10-12: 23:00:00
  Filled 2019-10-12: qty 1

## 2019-10-12 MED ORDER — ACETAMINOPHEN 325 MG PO TABS
650.0000 mg | ORAL_TABLET | Freq: Four times a day (QID) | ORAL | Status: DC | PRN
  Administered 2019-10-13: 650 mg via ORAL
  Filled 2019-10-12: qty 2

## 2019-10-12 MED ORDER — CO Q-10 100 MG PO CAPS: 100.0000 mg | ORAL_CAPSULE | Freq: Every day | ORAL | Status: DC

## 2019-10-12 MED ORDER — MAGNESIUM SULFATE 2 GM/50ML IV SOLN
2.0000 g | Freq: Once | INTRAVENOUS | Status: AC
  Administered 2019-10-12: 2 g via INTRAVENOUS
  Filled 2019-10-12: qty 50

## 2019-10-12 MED ORDER — POTASSIUM PHOSPHATE MONOBASIC 500 MG PO TABS
500.0000 mg | ORAL_TABLET | Freq: Three times a day (TID) | ORAL | Status: DC
  Filled 2019-10-12 (×3): qty 1

## 2019-10-12 MED ORDER — SODIUM CHLORIDE 0.9 % IV BOLUS
1000.0000 mL | Freq: Once | INTRAVENOUS | Status: AC
  Administered 2019-10-12: 1000 mL via INTRAVENOUS

## 2019-10-12 MED ORDER — SODIUM CHLORIDE 0.9 % IV SOLN
1.0000 g | INTRAVENOUS | Status: DC
  Administered 2019-10-13: 1 g via INTRAVENOUS
  Filled 2019-10-12 (×2): qty 10

## 2019-10-12 MED ORDER — ACETAMINOPHEN 325 MG PO TABS
650.0000 mg | ORAL_TABLET | Freq: Once | ORAL | Status: AC
  Administered 2019-10-12: 650 mg via ORAL
  Filled 2019-10-12: qty 2

## 2019-10-12 MED ORDER — IOHEXOL 300 MG/ML  SOLN
75.0000 mL | Freq: Once | INTRAMUSCULAR | Status: AC | PRN
  Administered 2019-10-12: 75 mL via INTRAVENOUS

## 2019-10-12 MED ORDER — SODIUM CHLORIDE 0.9 % IV BOLUS
500.0000 mL | Freq: Once | INTRAVENOUS | Status: AC
  Administered 2019-10-12: 500 mL via INTRAVENOUS

## 2019-10-12 MED ORDER — SODIUM CHLORIDE 0.9 % IV SOLN
Freq: Once | INTRAVENOUS | Status: AC
  Administered 2019-10-12: 19:00:00 via INTRAVENOUS

## 2019-10-12 MED ORDER — ACETAMINOPHEN 500 MG PO TABS: 1000.0000 mg | ORAL_TABLET | Freq: Once | ORAL | Status: DC

## 2019-10-12 MED ORDER — VITAMIN B-1 100 MG PO TABS
100.0000 mg | ORAL_TABLET | Freq: Every day | ORAL | Status: DC
  Administered 2019-10-13 – 2019-10-16 (×4): 100 mg via ORAL
  Filled 2019-10-12 (×4): qty 1

## 2019-10-12 MED ORDER — ACETAMINOPHEN 650 MG RE SUPP: 650.0000 mg | Freq: Four times a day (QID) | RECTAL | Status: DC | PRN

## 2019-10-12 MED ORDER — LEVOTHYROXINE SODIUM 112 MCG PO TABS
112.0000 ug | ORAL_TABLET | Freq: Every day | ORAL | Status: DC
  Administered 2019-10-13 – 2019-10-16 (×4): 112 ug via ORAL
  Filled 2019-10-12 (×4): qty 1

## 2019-10-12 NOTE — ED Notes (Signed)
Bladder scan: 96ml

## 2019-10-12 NOTE — H&P (Signed)
History and Physical    [name] [id number] DOB: 01-14-1946 DOA: 10/12/2019  PCP: Claretta Fraise, MD   Patient coming from: Home.  I have personally briefly reviewed patient's old medical records in [place]  Chief Complaint: Nausea, vomiting and diarrhea.  HPI: [name] is a 73 y.o. female with medical history significant of alcoholism, anemia, anxiety/depression, osteoarthritis of left knee, B12 deficiency, right carotid artery narrowing, stage II chronic kidney disease, COPD, headaches, hyperlipidemia, hypertension, hyponatremia, hypothyroidism, history of TIA, history of orthostatic syncope, history of pneumonia, history of SBO who is coming to the emergency department with complaints of 5 days of progressively worse urinary urgency, lower abdominal pain and dysuria.  She denies flank pain or hematuria.  She has had multiple episodes of emesis and diarrhea for the past 3 days.  She denies melena or hematochezia.  She did try over-the-counter Azo with some improvement, but did not take away completely the symptoms.  She denies fevers, but complains of chills, night sweats, decreased appetite, fatigue and malaise.  No headache, rhinorrhea, sore throat, wheezing or hemoptysis.  Denies chest pain, dyspnea, palpitations, PND, orthopnea or recent pitting edema of the lower extremities.  No polyuria, polydipsia, polyphagia or blurred vision.  ED Course: Initial vital signs were temperature 100.4 F, pulse 103, respiration 18, blood pressure 153/70 mmHg and O2 sat 100% on room air.  Patient was given Rocephin and a total of 1500 mL of normal saline.  While in the ER, the patient had an episode of tachycardiac in the 130s and 140s and received lorazepam and IV fluids, which improved her heart rate.  Her urinalysis showed moderate hemoglobinuria, proteinuria, positive nitrites, small leukocyte esterase, 21-50 WBC per hpf and many bacteria on microscopic examination.  Blood cultures  x2 were drawn.  CBC shows a white count of 10.0 with 82% neutrophils 7% lymphocytes and 10% monocytes.  Hemoglobin was 11.1 g/dL and the peripheral smear showed platelet clumps. Lactic acid was 2.5, sodium 128, potassium 3.0 and chloride 93 mmol/L.  CO2 was normal.  Glucose 116, BUN 17, creatinine 1.34 and calcium 8.1 mg/dL.  Her creatinine is usually below 1.0.  LFTs were normal.  Imaging: Her chest radiograph did not have any active cardiopulmonary disease.  CT abdomen/pelvis with contrast showed multiple areas of decreased enhancement in the kidneys consistent with pyelonephritis.  There is mild wall thickening of the terminal ileum and right colon of the ileocecal valve, without adjacent inflammation.  Aortic atherosclerosis.  Possible hepatic hemangioma.  There is borderline splenomegaly.  Please see images and full radiology report for further detail.  Review of Systems: As per HPI otherwise 10 point review of systems negative.   Past Medical History:  Diagnosis Date  . Alcoholism ([place]) 01/09/2012  . Anemia   . Anxiety   . Arthritis    Left knee  . B12 deficiency   . Carotid artery narrowing 10/03/2012   On the right.  . Chronic kidney disease (CKD), stage II (mild)   . COPD (chronic obstructive pulmonary disease) ([place])   . Depression   . Headache(784.0)   . Hyperlipidemia   . Hypertension   . Hyponatremia 01/09/2012  . Hypothyroid   . Menopause   . Mini stroke ([place])   . Orthostatic syncope 10/02/2012  . Pneumonia   . SBO (small bowel obstruction) ([place]) 06/28/2016  . Shortness of breath   . Stroke [facility]) 2010   no deficits    Past Surgical History:  Procedure Laterality  Date  . AGILE CAPSULE N/A 02/22/2016   dummy capsule remained in the distal ileum  . APPENDECTOMY    . CATARACT EXTRACTION W/PHACO  06/20/2012   Procedure: CATARACT EXTRACTION PHACO AND INTRAOCULAR LENS PLACEMENT (IOC);  Surgeon: Tonny Branch, MD;  Location: [facility];  Service: Ophthalmology;  Laterality: Right;   CDE:10.66  . CATARACT EXTRACTION W/PHACO  07/11/2012   Procedure: CATARACT EXTRACTION PHACO AND INTRAOCULAR LENS PLACEMENT (IOC);  Surgeon: Tonny Branch, MD;  Location: [facility];  Service: Ophthalmology;  Laterality: Left;  CDE: 12.69  . COLONOSCOPY N/A 12/17/2015   RMR: Marked circumferential ulceration of the ascending colon/Ileocecal valve ulceration with biopsy most consistent with NSAID injury versus inflammatory bowel disease, pancolonoc diverticulosis redundant colon  . ESOPHAGOGASTRODUODENOSCOPY N/A 12/17/2015   [id number] gastric polyp, biopsy benign  . KNEE ARTHROSCOPY Right 09/2016  . LUMBAR LAMINECTOMY/DECOMPRESSION MICRODISCECTOMY N/A 05/25/2017   Procedure: CENTRAL DECOMPRESSIVE LUMBAR LAMINECTOMY FOR SPINAL STENOSIS L3-L4, FORAMINOTOMY FOR THE L4 ROOT AND L5 ROOT;  Surgeon: Latanya Maudlin, MD;  Location: [facility];  Service: Orthopedics;  Laterality: N/A;  . TONSILLECTOMY       reports that she quit smoking about 27 years ago. Her smoking use included cigarettes. She has a 60.00 pack-year smoking history. She has never used smokeless tobacco. She reports that she does not drink alcohol or use drugs.  Allergies  Allergen Reactions  . Ciprofloxacin Other (See Comments)    Caused pt to have delusional thoughts and hallucinations  . Procardia [Nifedipine] Swelling    Edema    . Norvasc [Amlodipine Besylate] Hives    Family History  Problem Relation Age of Onset  . GER disease Mother   . Ulcers Mother   . Hypertension Maternal Grandmother    Prior to Admission medications   Medication Sig Start Date End Date Taking? Authorizing Provider  alendronate (FOSAMAX) 70 MG tablet Take 70 mg by mouth once a week. Take with a full glass of water on an empty stomach.   Yes [provider]  aspirin EC 81 MG tablet Take 81 mg by mouth daily.   Yes [provider]  Coenzyme Q10 (CO Q-10) 100 MG CAPS Take 100 mg by mouth daily. 11/15/16  Yes Claretta Fraise, MD  DULoxetine  (CYMBALTA) 60 MG capsule Take 1 capsule (60 mg total) by mouth daily. 08/19/19  Yes Stacks, Cletus Gash, MD  folic acid (FOLVITE) Q000111Q MCG tablet Take 2,000 mcg by mouth daily.   Yes [provider]  levothyroxine (SYNTHROID, LEVOTHROID) 112 MCG tablet Take 1 tablet (112 mcg total) by mouth daily. 12/31/18  Yes Stacks, Cletus Gash, MD  liothyronine (CYTOMEL) 5 MCG tablet Take 2 tablets (10 mcg total) by mouth daily. 04/23/19  Yes Stacks, Cletus Gash, MD  nabumetone (RELAFEN) 500 MG tablet TAKE 2 TABLETS (1,000 MG TOTAL) BY MOUTH 2 (TWO) TIMES DAILY FOR MUSCLE AND JOINT PAIN 05/08/19  Yes Claretta Fraise, MD  olmesartan-hydrochlorothiazide (BENICAR HCT) 40-25 MG tablet Take 1 tablet by mouth daily. 08/25/19  Yes Stacks, Cletus Gash, MD  rosuvastatin (CRESTOR) 10 MG tablet TAKE 1 TABLET EVERY DAY 05/08/19  Yes Claretta Fraise, MD  thiamine (VITAMIN B-1) 100 MG tablet Take 1 tablet (100 mg total) by mouth daily. 04/20/16  Yes Claretta Fraise, MD  acetaminophen (TYLENOL) 500 MG tablet Take 1 tablet (500 mg total) by mouth 2 (two) times daily. Pain Patient not taking: Reported on 10/12/2019 07/02/16   Rexene Alberts, MD  ondansetron (ZOFRAN) 4 MG tablet Take 1 tablet (4 mg total) by mouth  every 6 (six) hours as needed for nausea. Patient not taking: Reported on 10/12/2019 04/22/19   Heath Lark D, DO    Physical Exam: Vitals:   10/12/19 1421 10/12/19 1422 10/12/19 1808  BP: (!) 153/70  (!) 143/81  Pulse: (!) 103  93  Resp: 18  17  Temp: (!) 100.4 F (38 C)  100.1 F (37.8 C)  TempSrc: Oral  Oral  SpO2: 100%  99%  Weight:  72.6 kg   Height:  5\' 8"  (1.727 m)     Constitutional: Looks acutely ill, but currently in NAD Eyes: PERRL, lids and conjunctivae normal ENMT: Mucous membranes are mildly dry. Posterior pharynx clear of any exudate or lesions. Neck: normal, supple, no masses, no thyromegaly Respiratory: clear to auscultation bilaterally, no wheezing, no crackles. Normal respiratory effort. No accessory muscle  use.  Cardiovascular: Tachycardic at 114 bpm, no murmurs / rubs / gallops. No extremity edema. 2+ pedal pulses. No carotid bruits.  Abdomen: Nondistended, soft, positive suprapubic tenderness, no guarding or rebound, no flank tenderness, masses palpated. No hepatosplenomegaly. Bowel sounds positive.  Musculoskeletal: no clubbing / cyanosis. Good ROM, no contractures. Normal muscle tone.  Skin: no rashes, lesions, ulcers on limited dermatological examination. Neurologic: CN 2-12 grossly intact. Sensation intact, DTR normal. Strength 5/5 in all 4.  Psychiatric: Normal judgment and insight. Alert and oriented x 3. Normal mood.   Labs on Admission: I have personally reviewed following labs and imaging studies  CBC: Recent Labs  Lab 10/12/19 1449  WBC 10.0  NEUTROABS 8.2*  HGB 11.1*  HCT 34.1*  MCV 94.5  PLT PLATELET CLUMPS NOTED ON SMEAR, COUNT APPEARS ADEQUATE   Basic Metabolic Panel: Recent Labs  Lab 10/12/19 1449  NA 128*  K 3.0*  CL 93*  CO2 22  GLUCOSE 116*  BUN 17  CREATININE 1.34*  CALCIUM 8.1*   GFR: Estimated Creatinine Clearance: 38.3 mL/min (A) (by C-G formula based on SCr of 1.34 mg/dL (H)). Liver Function Tests: Recent Labs  Lab 10/12/19 1449  AST 20  ALT 11  ALKPHOS 73  BILITOT 1.1  PROT 7.2  ALBUMIN 3.6   Recent Labs  Lab 10/12/19 1449  LIPASE 17   No results for input(s): AMMONIA in the last 168 hours. Coagulation Profile: No results for input(s): INR, PROTIME in the last 168 hours. Cardiac Enzymes: No results for input(s): CKTOTAL, CKMB, CKMBINDEX, TROPONINI in the last 168 hours. BNP (last 3 results) No results for input(s): PROBNP in the last 8760 hours. HbA1C: No results for input(s): HGBA1C in the last 72 hours. CBG: No results for input(s): GLUCAP in the last 168 hours. Lipid Profile: No results for input(s): CHOL, HDL, LDLCALC, TRIG, CHOLHDL, LDLDIRECT in the last 72 hours. Thyroid Function Tests: No results for input(s): TSH,  T4TOTAL, FREET4, T3FREE, THYROIDAB in the last 72 hours. Anemia Panel: No results for input(s): VITAMINB12, FOLATE, FERRITIN, TIBC, IRON, RETICCTPCT in the last 72 hours. Urine analysis:    Component Value Date/Time   COLORURINE AMBER (A) 10/12/2019 1730   APPEARANCEUR HAZY (A) 10/12/2019 1730   APPEARANCEUR Clear 12/27/2018 0928   LABSPEC 1.011 10/12/2019 1730   PHURINE 5.0 10/12/2019 1730   GLUCOSEU NEGATIVE 10/12/2019 1730   HGBUR MODERATE (A) 10/12/2019 1730   BILIRUBINUR NEGATIVE 10/12/2019 1730   BILIRUBINUR Negative 12/27/2018 0928   KETONESUR NEGATIVE 10/12/2019 1730   PROTEINUR 100 (A) 10/12/2019 1730   UROBILINOGEN negative 09/10/2015 1624   UROBILINOGEN 0.2 04/30/2015 0346   NITRITE POSITIVE (A) 10/12/2019 1730  LEUKOCYTESUR SMALL (A) 10/12/2019 1730    Radiological Exams on Admission: Ct Abdomen Pelvis W Contrast  Result Date: 10/12/2019 CLINICAL DATA:  Abdominal pain. Urinary and bowel incontinence. Emesis. EXAM: CT ABDOMEN AND PELVIS WITH CONTRAST TECHNIQUE: Multidetector CT imaging of the abdomen and pelvis was performed using the standard protocol following bolus administration of intravenous contrast. CONTRAST:  61mL OMNIPAQUE IOHEXOL 300 MG/ML  SOLN COMPARISON:  06/28/2016 FINDINGS: Lower chest: Choose 1 Hepatobiliary: Liver normal in size and overall attenuation. 11 mm low-attenuation lesion in right lobe, segment 7, not well-defined on the prior CT that was performed without contrast. This is likely a hemangioma. No other liver masses or lesions. Small gallstones suggested dependently a mildly distended gallbladder. No wall thickening or inflammation. No bile duct dilation. Pancreas: Unremarkable. No pancreatic ductal dilatation or surrounding inflammatory changes. Spleen: Prominent spleen, 12 cm in long axis increased from 10 cm on the prior CT. No splenic mass or focal lesion. Adrenals/Urinary Tract: No adrenal masses. Bilateral renal cortical thinning. There are  multiple areas of relative decreased renal enhancement bilaterally, most evident on the delayed sequence. No defined renal masses, no stones and no hydronephrosis. Mild stranding in the perinephric spaces, without a perinephric fluid collection. Ureters are normal in course and in caliber. Bladder is unremarkable. Stomach/Bowel: Unremarkable stomach. Colon and small bowel are normal in caliber. Mild wall thickening of the terminal ileum. No other small bowel wall thickening. There is mild wall prominence at the ileocecal valve, in the right colon. Remaining portions of the colon shows normal wall thickness. No mesenteric inflammation. Vascular/Lymphatic: Aortic atherosclerosis. No aneurysm. There is plaque at the origin of the renal arteries, but no definite hemodynamically significant stenosis. No enlarged lymph nodes. Reproductive: Uterus and bilateral adnexa are unremarkable. Other: No abdominal wall hernia or abnormality. No abdominopelvic ascites. Musculoskeletal: Compression fractures of T12 and L1. Slight depression of the upper endplate of L2 and lower endplate of L3. Small node along the upper endplate of L4. The compression fracture of L1 has increased in severity from the prior CT. The compression fracture of T12 is new. These do not appear acute or recent, however. No other fractures. No osteoblastic or osteolytic bone lesions. IMPRESSION: 1. Multiple areas of decreased enhancement in the kidneys, best seen on the delayed sequence. This is consistent with bilateral pyelonephritis in the proper clinical setting. No evidence of a renal or perinephric abscess. No hydronephrosis. 2. Mild wall thickening of the terminal ileum and right colon at the ileocecal valve, without adjacent inflammation. Consider infectious or inflammatory ileitis. 3. No other evidence of an acute abnormality. 4. Aortic atherosclerosis. 5. Liver lesion that is most likely a hemangioma. 6. Spleen appears borderline enlarged and larger  than it was previously. Etiology of this is unclear. Electronically Signed   By: Lajean Manes M.D.   On: 10/12/2019 19:28   Dg Chest Portable 1 View  Result Date: 10/12/2019 CLINICAL DATA:  Fever, abdominal pain. EXAM: PORTABLE CHEST 1 VIEW COMPARISON:  Chest x-rays dated 05/18/2017 and 10/18/2016. FINDINGS: Heart size and mediastinal contours are within normal limits. Biapical pleuroparenchymal scarring/fibrosis. Lungs otherwise clear. No confluent opacity to suggest a developing pneumonia. No pleural effusion. Osseous structures about the chest are unremarkable. IMPRESSION: No active cardiopulmonary disease. No evidence of pneumonia or pulmonary edema. Electronically Signed   By: Franki Cabot M.D.   On: 10/12/2019 16:14    EKG: Independently reviewed. Vent. rate 119 BPM PR interval * ms QRS duration 127 ms QT/QTc 322/453 ms P-R-T  axes 86 76 84 Sinus tachycardia Nonspecific intraventricular conduction delay Minimal ST elevation, inferior leads Artifact in lead(s) I II III aVR aVL aVF V1 V2 V3 V6  Assessment/Plan Principal Problem:   Sepsis secondary to UTI ([place]) Admit to stepdown/inpatient. Continue IV fluids. Continue IV antibiotics. Follow-up urine culture and sensitivity. Follow-up blood culture and sensitivity.  Active Problems:   Sinus tachycardia Due to volume depletion, fever and abnormal electrolytes. Continue IVF, IV antibiotics and electrolyte correction.    Hyponatremia Secondary to GI losses/HCTZ. Continue normal saline infusion. Hold hydrochlorothiazide. Follow-up sodium level.    Lactic acidosis Received IV fluids. Will check in a.m.    Hypokalemia Continue electrolyte replacement. Follow-up potassium level.    Hypothyroidism Continue levothyroxine 112 mcg p.o. daily. Continue Cytomel 10 mcg p.o. daily.    Essential hypertension Hold antihypertensives at this time. Monitor blood pressure.    Hyperlipidemia Continue rosuvastatin 10 mg p.o. daily.     Anemia Follow-up H&H.   DVT prophylaxis: Lovenox SQ.  Code Status: Full code. Family Communication:  Disposition Plan: Admit for IV antibiotic therapy for 2 to 3 days. Consults called: Admission status: Inpatient/stepdown.   Reubin Milan MD [facility]  If 7PM-7AM, please contact night-coverage [email or web address]  10/12/2019, 8:04 PM   This document was prepared using Dragon voice recognition software and may contain some unintended transcription errors.

## 2019-10-12 NOTE — ED Notes (Signed)
Call from lab   Critical Lab   Lactic acid 2.5   Dr D called and informed

## 2019-10-12 NOTE — ED Triage Notes (Addendum)
Pt reports abd pain, urinary and bowel incontinence, emesis x1 for 3 days. Pt reports history of same and reports "sodium level was low". Pt denies abd pain at this time.  Pt reports took [place] for urinary frequency/incontinence.

## 2019-10-12 NOTE — ED Provider Notes (Signed)
[facility] EMERGENCY DEPARTMENT Provider Note   CSN: [id number] Arrival date & time: 10/12/19  1407     History   Chief Complaint Chief Complaint  Patient presents with   Abdominal Pain    HPI [name] is a 73 y.o. female with history of CKD, COPD, hypertension, hyperlipidemia, small bowel obstruction, alcoholism presents today for evaluation of acute onset, progressively worsening nausea, vomiting, diarrhea, abdominal pain for 3 days.  Reports intermittent lower abdominal pain, urinary frequency and dysuria.  This improved with Azo.  She initially said that she was incontinent of urine but went on to explain that when she gets the urge to go urinate she "cannot get to the bathroom in time ".  She has chronic low back pain that she reports is at baseline for her with no worsening, no recent trauma.  Denies pain or numbness or weakness radiating into the extremities.  Denies saddle anesthesia.  She does note a few episodes of watery nonbloody diarrhea over the last 3 days and states that she has had decreased appetite and decreased oral intake due to "everything going through me ".  Denies fevers but she does have an elevated temperature of 100.4 F here in the ED on triage.  Denies chest pain or shortness of breath.  No cough.  No recent travel, no recent treatment with antibiotics, no suspicious food intake per the patient.      The history is provided by the patient.    Past Medical History:  Diagnosis Date   Alcoholism ([place]) 01/09/2012   Anemia    Anxiety    Arthritis    Left knee   B12 deficiency    Carotid artery narrowing 10/03/2012   On the right.   Chronic kidney disease (CKD), stage II (mild)    COPD (chronic obstructive pulmonary disease) (HCC)    Depression    Headache(784.0)    Hyperlipidemia    Hypertension    Hyponatremia 01/09/2012   Hypothyroid    Menopause    Mini stroke (HCC)    Orthostatic syncope 10/02/2012   Pneumonia    SBO  (small bowel obstruction) ([place]) 06/28/2016   Shortness of breath    Stroke ([place]) 2010   no deficits    Patient Active Problem List   Diagnosis Date Noted   Sepsis secondary to UTI ([place]) 10/12/2019   Lactic acidosis 10/12/2019   Arthritis    Hyponatremia 04/21/2019   Spinal stenosis, lumbar region with neurogenic claudication 05/25/2017   Sinus tachycardia 06/29/2016   Transaminitis 03/10/2016   Pelvic mass 01/27/2016   Colonic ulcer    Diverticulosis of colon without hemorrhage    Iron deficiency anemia due to chronic blood loss    Lumbar pain 12/14/2015   Chronic kidney disease (CKD), stage II (mild) 10/03/2012   Carotid artery narrowing 10/03/2012   Hypokalemia 10/02/2012   Hypothyroidism 10/02/2012   Essential hypertension 01/09/2012   Hyperlipidemia 01/09/2012   Anemia 01/09/2012   B12 deficiency 01/09/2012    Past Surgical History:  Procedure Laterality Date   AGILE CAPSULE N/A 02/22/2016   dummy capsule remained in the distal ileum   APPENDECTOMY     CATARACT EXTRACTION W/PHACO  06/20/2012   Procedure: CATARACT EXTRACTION PHACO AND INTRAOCULAR LENS PLACEMENT ([place]);  Surgeon: Tonny Branch, MD;  Location: [facility];  Service: Ophthalmology;  Laterality: Right;  CDE:10.66   CATARACT EXTRACTION W/PHACO  07/11/2012   Procedure: CATARACT EXTRACTION PHACO AND INTRAOCULAR LENS PLACEMENT ([facility]);  Surgeon: Tonny Branch,  MD;  Location: [facility];  Service: Ophthalmology;  Laterality: Left;  CDE: 12.69   COLONOSCOPY N/A 12/17/2015   RMR: Marked circumferential ulceration of the ascending colon/Ileocecal valve ulceration with biopsy most consistent with NSAID injury versus inflammatory bowel disease, pancolonoc diverticulosis redundant colon   ESOPHAGOGASTRODUODENOSCOPY N/A 12/17/2015   [id number] gastric polyp, biopsy benign   KNEE ARTHROSCOPY Right 09/2016   LUMBAR LAMINECTOMY/DECOMPRESSION MICRODISCECTOMY N/A 05/25/2017   Procedure: CENTRAL DECOMPRESSIVE LUMBAR  LAMINECTOMY FOR SPINAL STENOSIS L3-L4, FORAMINOTOMY FOR THE L4 ROOT AND L5 ROOT;  Surgeon: Latanya Maudlin, MD;  Location: [facility];  Service: Orthopedics;  Laterality: N/A;   TONSILLECTOMY       OB History   No obstetric history on file.      Home Medications    Prior to Admission medications   Medication Sig Start Date End Date Taking? Authorizing Provider  alendronate (FOSAMAX) 70 MG tablet Take 70 mg by mouth once a week. Take with a full glass of water on an empty stomach.   Yes [provider]  aspirin EC 81 MG tablet Take 81 mg by mouth daily.   Yes [provider]  Coenzyme Q10 (CO Q-10) 100 MG CAPS Take 100 mg by mouth daily. 11/15/16  Yes Claretta Fraise, MD  DULoxetine (CYMBALTA) 60 MG capsule Take 1 capsule (60 mg total) by mouth daily. 08/19/19  Yes Stacks, Cletus Gash, MD  folic acid (FOLVITE) [id number] MCG tablet Take 2,000 mcg by mouth daily.   Yes [provider]  levothyroxine (SYNTHROID, LEVOTHROID) 112 MCG tablet Take 1 tablet (112 mcg total) by mouth daily. 12/31/18  Yes Stacks, Cletus Gash, MD  liothyronine (CYTOMEL) 5 MCG tablet Take 2 tablets (10 mcg total) by mouth daily. 04/23/19  Yes Stacks, Cletus Gash, MD  nabumetone (RELAFEN) 500 MG tablet TAKE 2 TABLETS (1,000 MG TOTAL) BY MOUTH 2 (TWO) TIMES DAILY FOR MUSCLE AND JOINT PAIN 05/08/19  Yes Claretta Fraise, MD  olmesartan-hydrochlorothiazide (BENICAR HCT) 40-25 MG tablet Take 1 tablet by mouth daily. 08/25/19  Yes Stacks, Cletus Gash, MD  rosuvastatin (CRESTOR) 10 MG tablet TAKE 1 TABLET EVERY DAY 05/08/19  Yes Claretta Fraise, MD  thiamine (VITAMIN B-1) 100 MG tablet Take 1 tablet (100 mg total) by mouth daily. 04/20/16  Yes Claretta Fraise, MD  acetaminophen (TYLENOL) 500 MG tablet Take 1 tablet (500 mg total) by mouth 2 (two) times daily. Pain Patient not taking: Reported on 10/12/2019 07/02/16   Rexene Alberts, MD  ondansetron (ZOFRAN) 4 MG tablet Take 1 tablet (4 mg total) by mouth every 6 (six) hours as needed for  nausea. Patient not taking: Reported on 10/12/2019 04/22/19   Heath Lark D, DO    Family History Family History  Problem Relation Age of Onset   GER disease Mother    Ulcers Mother    Hypertension Maternal Grandmother     Social History Social History   Tobacco Use   Smoking status: Former Smoker    Packs/day: 2.00    Years: 30.00    Pack years: 60.00    Types: Cigarettes    Quit date: 06/17/1992    Years since quitting: 27.3   Smokeless tobacco: Never Used  Substance Use Topics   Alcohol use: No   Drug use: No     Allergies   Ciprofloxacin, Procardia [nifedipine], and Norvasc [amlodipine besylate]   Review of Systems Review of Systems  Constitutional: Negative for chills and fever.  Respiratory: Negative for shortness of breath.   Cardiovascular: Negative for chest pain.  Gastrointestinal: Positive for abdominal pain, diarrhea, nausea and vomiting.  Genitourinary: Positive for dysuria, frequency and urgency. Negative for hematuria.  All other systems reviewed and are negative.    Physical Exam Updated Vital Signs BP 104/68    Pulse (!) 120    Temp 100.1 F (37.8 C) (Oral)    Resp 19    Ht 5\' 8"  (1.727 m)    Wt 72.6 kg    LMP 08/21/2013    SpO2 99%    BMI 24.33 kg/m   Physical Exam Vitals signs and nursing note reviewed.  Constitutional:      General: She is not in acute distress.    Appearance: She is well-developed.  HENT:     Head: Normocephalic and atraumatic.  Eyes:     General:        Right eye: No discharge.        Left eye: No discharge.     Conjunctiva/sclera: Conjunctivae normal.  Neck:     Vascular: No JVD.     Trachea: No tracheal deviation.  Cardiovascular:     Rate and Rhythm: Regular rhythm. Tachycardia present.     Heart sounds: Normal heart sounds.  Pulmonary:     Effort: Pulmonary effort is normal.     Breath sounds: Normal breath sounds.  Abdominal:     General: Abdomen is flat. There is no distension.     Palpations:  Abdomen is soft.     Tenderness: There is abdominal tenderness in the suprapubic area. There is no right CVA tenderness, left CVA tenderness, guarding or rebound.  Skin:    General: Skin is warm and dry.     Findings: No erythema.  Neurological:     Mental Status: She is alert.  Psychiatric:        Behavior: Behavior normal.      ED Treatments / Results  Labs (all labs ordered are listed, but only abnormal results are displayed) Labs Reviewed  COMPREHENSIVE METABOLIC PANEL - Abnormal; Notable for the following components:      Result Value   Sodium 128 (*)    Potassium 3.0 (*)    Chloride 93 (*)    Glucose, Bld 116 (*)    Creatinine, Ser 1.34 (*)    Calcium 8.1 (*)    GFR calc non Af Amer 39 (*)    GFR calc Af Amer 46 (*)    All other components within normal limits  CBC - Abnormal; Notable for the following components:   RBC 3.61 (*)    Hemoglobin 11.1 (*)    HCT 34.1 (*)    All other components within normal limits  URINALYSIS, ROUTINE W REFLEX MICROSCOPIC - Abnormal; Notable for the following components:   Color, Urine AMBER (*)    APPearance HAZY (*)    Hgb urine dipstick MODERATE (*)    Protein, ur 100 (*)    Nitrite POSITIVE (*)    Leukocytes,Ua SMALL (*)    Bacteria, UA MANY (*)    All other components within normal limits  LACTIC ACID, PLASMA - Abnormal; Notable for the following components:   Lactic Acid, Venous 2.5 (*)    All other components within normal limits  DIFFERENTIAL - Abnormal; Notable for the following components:   Neutro Abs 8.2 (*)    Abs Immature Granulocytes 0.11 (*)    All other components within normal limits  PHOSPHORUS - Abnormal; Notable for the following components:   Phosphorus 2.0 (*)    All  other components within normal limits  CULTURE, BLOOD (ROUTINE X 2)  CULTURE, BLOOD (ROUTINE X 2)  SARS CORONAVIRUS 2 (TAT 6-24 HRS)  LIPASE, BLOOD  MAGNESIUM  CBC WITH DIFFERENTIAL/PLATELET  COMPREHENSIVE METABOLIC PANEL    EKG EKG  Interpretation  Date/Time:  Sunday October 12 2019 20:50:48 EST Ventricular Rate:  156 PR Interval:    QRS Duration: 99 QT Interval:  265 QTC Calculation: 427 R Axis:   70 Text Interpretation: poor baseline quality, most likely sinus tachycardia No acute STEMI Confirmed by Madalyn Rob [phone]) on 10/12/2019 9:00:40 PM   Radiology Ct Abdomen Pelvis W Contrast  Result Date: 10/12/2019 CLINICAL DATA:  Abdominal pain. Urinary and bowel incontinence. Emesis. EXAM: CT ABDOMEN AND PELVIS WITH CONTRAST TECHNIQUE: Multidetector CT imaging of the abdomen and pelvis was performed using the standard protocol following bolus administration of intravenous contrast. CONTRAST:  56mL OMNIPAQUE IOHEXOL 300 MG/ML  SOLN COMPARISON:  06/28/2016 FINDINGS: Lower chest: Choose 1 Hepatobiliary: Liver normal in size and overall attenuation. 11 mm low-attenuation lesion in right lobe, segment 7, not well-defined on the prior CT that was performed without contrast. This is likely a hemangioma. No other liver masses or lesions. Small gallstones suggested dependently a mildly distended gallbladder. No wall thickening or inflammation. No bile duct dilation. Pancreas: Unremarkable. No pancreatic ductal dilatation or surrounding inflammatory changes. Spleen: Prominent spleen, 12 cm in long axis increased from 10 cm on the prior CT. No splenic mass or focal lesion. Adrenals/Urinary Tract: No adrenal masses. Bilateral renal cortical thinning. There are multiple areas of relative decreased renal enhancement bilaterally, most evident on the delayed sequence. No defined renal masses, no stones and no hydronephrosis. Mild stranding in the perinephric spaces, without a perinephric fluid collection. Ureters are normal in course and in caliber. Bladder is unremarkable. Stomach/Bowel: Unremarkable stomach. Colon and small bowel are normal in caliber. Mild wall thickening of the terminal ileum. No other small bowel wall thickening. There is  mild wall prominence at the ileocecal valve, in the right colon. Remaining portions of the colon shows normal wall thickness. No mesenteric inflammation. Vascular/Lymphatic: Aortic atherosclerosis. No aneurysm. There is plaque at the origin of the renal arteries, but no definite hemodynamically significant stenosis. No enlarged lymph nodes. Reproductive: Uterus and bilateral adnexa are unremarkable. Other: No abdominal wall hernia or abnormality. No abdominopelvic ascites. Musculoskeletal: Compression fractures of T12 and L1. Slight depression of the upper endplate of L2 and lower endplate of L3. Small node along the upper endplate of L4. The compression fracture of L1 has increased in severity from the prior CT. The compression fracture of T12 is new. These do not appear acute or recent, however. No other fractures. No osteoblastic or osteolytic bone lesions. IMPRESSION: 1. Multiple areas of decreased enhancement in the kidneys, best seen on the delayed sequence. This is consistent with bilateral pyelonephritis in the proper clinical setting. No evidence of a renal or perinephric abscess. No hydronephrosis. 2. Mild wall thickening of the terminal ileum and right colon at the ileocecal valve, without adjacent inflammation. Consider infectious or inflammatory ileitis. 3. No other evidence of an acute abnormality. 4. Aortic atherosclerosis. 5. Liver lesion that is most likely a hemangioma. 6. Spleen appears borderline enlarged and larger than it was previously. Etiology of this is unclear. Electronically Signed   By: Lajean Manes M.D.   On: 10/12/2019 19:28   Dg Chest Portable 1 View  Result Date: 10/12/2019 CLINICAL DATA:  Shortness of breath EXAM: PORTABLE CHEST 1 VIEW COMPARISON:  Portable exam 2110 hours compared to 1554 hours FINDINGS: Normal heart size, mediastinal contours, and pulmonary vascularity. Atherosclerotic calcification aorta. Biapical scarring. Lungs otherwise clear. No acute infiltrate, pleural  effusion or pneumothorax. Bones unremarkable. IMPRESSION: No acute abnormalities. Electronically Signed   By: Lavonia Dana M.D.   On: 10/12/2019 21:32   Dg Chest Portable 1 View  Result Date: 10/12/2019 CLINICAL DATA:  Fever, abdominal pain. EXAM: PORTABLE CHEST 1 VIEW COMPARISON:  Chest x-rays dated 05/18/2017 and 10/18/2016. FINDINGS: Heart size and mediastinal contours are within normal limits. Biapical pleuroparenchymal scarring/fibrosis. Lungs otherwise clear. No confluent opacity to suggest a developing pneumonia. No pleural effusion. Osseous structures about the chest are unremarkable. IMPRESSION: No active cardiopulmonary disease. No evidence of pneumonia or pulmonary edema. Electronically Signed   By: Franki Cabot M.D.   On: 10/12/2019 16:14    Procedures Procedures (including critical care time)  Medications Ordered in ED Medications  cefTRIAXone (ROCEPHIN) 1 g in sodium chloride 0.9 % 100 mL IVPB (has no administration in time range)  0.9 % NaCl with KCl 40 mEq / L  infusion (has no administration in time range)  magnesium sulfate IVPB 2 g 50 mL (has no administration in time range)  potassium phosphate (monobasic) (K-PHOS ORIGINAL) tablet 500 mg (has no administration in time range)  LORazepam (ATIVAN) 2 MG/ML injection (has no administration in time range)  acetaminophen (TYLENOL) tablet 650 mg (has no administration in time range)    Or  acetaminophen (TYLENOL) suppository 650 mg (has no administration in time range)  prochlorperazine (COMPAZINE) injection 5 mg (has no administration in time range)  sodium chloride 0.9 % bolus 500 mL (0 mLs Intravenous Stopped 10/12/19 1809)  ondansetron (ZOFRAN) injection 4 mg (4 mg Intravenous Given 10/12/19 1645)  acetaminophen (TYLENOL) tablet 650 mg (650 mg Oral Given 10/12/19 1637)  iohexol (OMNIPAQUE) 300 MG/ML solution 75 mL (75 mLs Intravenous Contrast Given 10/12/19 1855)  cefTRIAXone (ROCEPHIN) 1 g in sodium chloride 0.9 % 100 mL IVPB (0  g Intravenous Stopped 10/12/19 2056)  0.9 %  sodium chloride infusion ( Intravenous Stopped 10/12/19 2109)  LORazepam (ATIVAN) injection 1 mg (1 mg Intravenous Given 10/12/19 2108)  sodium chloride 0.9 % bolus 1,000 mL (1,000 mLs Intravenous New Bag/Given 10/12/19 2108)     Initial Impression / Assessment and Plan / ED Course  I have reviewed the triage vital signs and the nursing notes.  Pertinent labs & imaging results that were available during my care of the patient were reviewed by me and considered in my medical decision making (see chart for details).        Patient presenting for evaluation of lower abdominal pain with nausea, vomiting, diarrhea.  She is febrile to 100.4 F, initially tachycardic and hypertensive.  She has focal suprapubic tenderness on examination.  She has chronic low back pain, reports no change in her chronic pain and she has no focal midline lumbar spine tenderness.  She has normal strength and sensation of the bilateral lower extremities and no red flag signs concerning for cauda equina or spinal abscess.  Lab work obtained today reviewed by me shows no leukocytosis, mild anemia, mild elevation in creatinine but BUN within normal limits.  Her sodium and potassium are both low today, will give IV normal saline.  Her lactate was elevated so blood cultures were obtained.  I held off on 30 cc/kg bolus due to her hyponatremia, did not want to replenish too quickly and given that her blood pressures have  been within normal limits while she has been in the ED I do not feel that she required this bolus emergently.  UA concerning for UTI, will culture and start on IV Rocephin.  No evidence of pneumonia on chest x-ray today.  CT scan of the abdomen and pelvis consistent with bilateral pyelonephritis also possible infectious or inflammatory ileitis.  No evidence of acute surgical abnormality.  Spoke with Dr. Olevia Bowens with [facility] hospitalist service who agrees to assume care of patient and  bring her into the hospital for further evaluation and management.  Final Clinical Impressions(s) / ED Diagnoses   Final diagnoses:  Sepsis without acute organ dysfunction, due to unspecified organism [facility])  Acute pyelonephritis  Ileitis    ED Discharge Orders    None       Debroah Baller 10/12/19 2208    Lucrezia Starch, MD 10/13/19 1251

## 2019-10-12 NOTE — ED Notes (Signed)
This RN walked by room & pts monitor was alarming, I walked in room, pt had soiled herself, was complaining of SOB, chills, and needed blankets. Pt was cyanotic & HR was in 160s. Pt assisted back to bed, placed on 4L per [facility], placed back on cardiac monitor, obtained EKG, EDP @ bedside to assess pt & place orders. pts O2 back up to 100%. hospitalist paged & will be updated about situation shortly.

## 2019-10-13 DIAGNOSIS — E038 Other specified hypothyroidism: Secondary | ICD-10-CM

## 2019-10-13 DIAGNOSIS — N10 Acute tubulo-interstitial nephritis: Secondary | ICD-10-CM

## 2019-10-13 DIAGNOSIS — D509 Iron deficiency anemia, unspecified: Secondary | ICD-10-CM

## 2019-10-13 DIAGNOSIS — I10 Essential (primary) hypertension: Secondary | ICD-10-CM

## 2019-10-13 DIAGNOSIS — E872 Acidosis: Secondary | ICD-10-CM

## 2019-10-13 DIAGNOSIS — E876 Hypokalemia: Secondary | ICD-10-CM

## 2019-10-13 DIAGNOSIS — E871 Hypo-osmolality and hyponatremia: Secondary | ICD-10-CM

## 2019-10-13 LAB — BLOOD CULTURE ID PANEL (REFLEXED)

## 2019-10-13 LAB — COMPREHENSIVE METABOLIC PANEL
ALT: 9 U/L (ref 0–44)
AST: 17 U/L (ref 15–41)
Albumin: 2.9 g/dL — ABNORMAL LOW (ref 3.5–5.0)
Alkaline Phosphatase: 60 U/L (ref 38–126)
Anion gap: 13 (ref 5–15)
BUN: 15 mg/dL (ref 8–23)
CO2: 19 mmol/L — ABNORMAL LOW (ref 22–32)
Calcium: 7.7 mg/dL — ABNORMAL LOW (ref 8.9–10.3)
Chloride: 106 mmol/L (ref 98–111)
Creatinine, Ser: 1.29 mg/dL — ABNORMAL HIGH (ref 0.44–1.00)
GFR calc Af Amer: 48 mL/min — ABNORMAL LOW (ref >60)
GFR calc non Af Amer: 41 mL/min — ABNORMAL LOW (ref >60)
Glucose, Bld: 112 mg/dL — ABNORMAL HIGH (ref 70–99)
Potassium: 4.2 mmol/L (ref 3.5–5.1)
Sodium: 138 mmol/L (ref 135–145)
Total Bilirubin: 0.8 mg/dL (ref 0.3–1.2)
Total Protein: 6.1 g/dL — ABNORMAL LOW (ref 6.5–8.1)

## 2019-10-13 LAB — CBC WITH DIFFERENTIAL/PLATELET
Abs Immature Granulocytes: 0.06 10*3/uL (ref 0.00–0.07)
Basophils Absolute: 0 10*3/uL (ref 0.0–0.1)
Basophils Relative: 0 %
Eosinophils Absolute: 0 10*3/uL (ref 0.0–0.5)
Eosinophils Relative: 0 %
HCT: 32.2 % — ABNORMAL LOW (ref 36.0–46.0)
Hemoglobin: 10 g/dL — ABNORMAL LOW (ref 12.0–15.0)
Immature Granulocytes: 1 %
Lymphocytes Relative: 10 %
Lymphs Abs: 0.8 10*3/uL (ref 0.7–4.0)
MCH: 30.1 pg (ref 26.0–34.0)
MCHC: 31.1 g/dL (ref 30.0–36.0)
MCV: 97 fL (ref 80.0–100.0)
Monocytes Absolute: 1.1 10*3/uL — ABNORMAL HIGH (ref 0.1–1.0)
Monocytes Relative: 14 %
Neutro Abs: 5.6 10*3/uL (ref 1.7–7.7)
Neutrophils Relative %: 75 %
Platelets: 102 10*3/uL — ABNORMAL LOW (ref 150–400)
RBC: 3.32 MIL/uL — ABNORMAL LOW (ref 3.87–5.11)
RDW: 13.8 % (ref 11.5–15.5)
WBC: 7.5 10*3/uL (ref 4.0–10.5)
nRBC: 0 % (ref 0.0–0.2)

## 2019-10-13 LAB — MRSA PCR SCREENING: MRSA by PCR: NEGATIVE

## 2019-10-13 LAB — LACTIC ACID, PLASMA: Lactic Acid, Venous: 1 mmol/L (ref 0.5–1.9)

## 2019-10-13 LAB — SARS CORONAVIRUS 2 (TAT 6-24 HRS): SARS Coronavirus 2: NEGATIVE

## 2019-10-13 MED ORDER — K PHOS MONO-SOD PHOS DI & MONO 155-852-130 MG PO TABS
250.0000 mg | ORAL_TABLET | Freq: Three times a day (TID) | ORAL | Status: AC
  Administered 2019-10-13 (×3): 250 mg via ORAL
  Filled 2019-10-13: qty 1

## 2019-10-13 NOTE — Progress Notes (Signed)
PROGRESS NOTE    [name]  MWN:[id number] DOB: Feb 17, 1946 DOA: 10/12/2019 PCP: Claretta Fraise, MD     Brief Narrative:  As per H&P written by Dr. Olevia Bowens on 10/12/19 73 y.o. female with medical history significant of alcoholism, anemia, anxiety/depression, osteoarthritis of left knee, B12 deficiency, right carotid artery narrowing, stage II chronic kidney disease, COPD, headaches, hyperlipidemia, hypertension, hyponatremia, hypothyroidism, history of TIA, history of orthostatic syncope, history of pneumonia, history of SBO who is coming to the emergency department with complaints of 5 days of progressively worse urinary urgency, lower abdominal pain and dysuria.  She denies flank pain or hematuria.  She has had multiple episodes of emesis and diarrhea for the past 3 days.  She denies melena or hematochezia.  She did try over-the-counter Azo with some improvement, but did not take away completely the symptoms.  She denies fevers, but complains of chills, night sweats, decreased appetite, fatigue and malaise.  No headache, rhinorrhea, sore throat, wheezing or hemoptysis.  Denies chest pain, dyspnea, palpitations, PND, orthopnea or recent pitting edema of the lower extremities.  No polyuria, polydipsia, polyphagia or blurred vision.  ED Course: Initial vital signs were temperature 100.4 F, pulse 103, respiration 18, blood pressure 153/70 mmHg and O2 sat 100% on room air.  Patient was given Rocephin and a total of 1500 mL of normal saline.  While in the ER, the patient had an episode of tachycardiac in the 130s and 140s and received lorazepam and IV fluids, which improved her heart rate.   Assessment & Plan: 1-Sepsis secondary to UTI and gram neg rods bacteremia ([place]) -will continue IV antibiotics -follow culture results and sensitivity -continue IVF's and supportive care -patient met sepsis criteria on admission.  2-Essential hypertension -BP soft and stable -continue holding  antihypertensive agents for now.  3-Hyperlipidemia -continue statins.   4-Anemia -no signs of overt bleeding -will follow Hgb trend   5-thrombocytopenia -in the setting of chronic alcohol use most likely -no bleeding appreciated -avoid heparin products -follow platelets count.  6-hyponatremia and Hypokalemia -will continue IVF's and electrolyte repletion -follow trend.  7-Hypothyroidism -continue synthroid and cytomel   8-Lactic acidosis -in the setting of dehydration and infection -continue IVF's -repeat/followed lactic acid back to normal now.  9-Acute renal failure -pre-renal in nature due to dehydration  -UTI also playing a role -continue ivf's and and abx's therapy for infection -follow renal function trend  10-hx of alcohol abuse -no active withdrawal appreciated -per patient no alcohol usage in months -continue thiamine   11-depression -continue cymbalta.   DVT prophylaxis: SCD's. Code Status: Full Code. Family Communication: no family at bedside. Disposition Plan: remains inpatient, continue IV antibiotics, follow culture results, advance diet and assess clinical response.  Consultants:  None   Procedures:   See below for x-ray reports.  Antimicrobials:  Anti-infectives (From admission, onward)   Start     Dose/Rate Route Frequency Ordered Stop   10/12/19 1830  cefTRIAXone (ROCEPHIN) 1 g in sodium chloride 0.9 % 100 mL IVPB     1 g 200 mL/hr over 30 Minutes Intravenous  Once 10/12/19 1824 10/12/19 2056   10/12/19 1830  cefTRIAXone (ROCEPHIN) 1 g in sodium chloride 0.9 % 100 mL IVPB     1 g 200 mL/hr over 30 Minutes Intravenous Every 24 hours 10/12/19 1954         Subjective: Currently afebrile, but reports chills; denies chest pain, no nausea, no vomiting, no shortness of breath.  Patient reports appetite is better  and would like to have her diet advanced.  She is feeling weak and tired.  Objective: Vitals:   10/13/19 1600 10/13/19 1614  10/13/19 1700 10/13/19 1800  BP: 117/66  121/82 124/74  Pulse: (!) 103  (!) 124 (!) 115  Resp:   17 20  Temp:  98.4 F (36.9 C)    TempSrc:  Oral    SpO2: 100%  100% 99%  Weight:      Height:        Intake/Output Summary (Last 24 hours) at 10/13/2019 1923 Last data filed at 10/13/2019 1849 Gross per 24 hour  Intake 827.81 ml  Output --  Net 827.81 ml   Filed Weights   10/12/19 1422 10/12/19 2356 10/13/19 0500  Weight: 72.6 kg 73.4 kg 73.4 kg    Examination: General exam: Alert, awake, oriented x 3; feeling slightly better; given the still weak and deconditioned.  Denies nausea vomiting currently and would like to have her diet further advanced. Respiratory system: Clear to auscultation. Respiratory effort normal.  No using accessory muscles. Cardiovascular system: Rate controlled. No murmurs, rubs or gallops. Gastrointestinal system: Abdomen is nondistended, soft and nontender. No organomegaly or masses felt. Normal bowel sounds heard. Central nervous system: Alert and oriented. No focal neurological deficits. Extremities: No cyanosis or clubbing. Skin: No rashes, lesions or ulcers Psychiatry: Judgement and insight appear normal. Mood & affect appropriate.     Data Reviewed: I have personally reviewed following labs and imaging studies  CBC: Recent Labs  Lab 10/12/19 1449 10/13/19 0351  WBC 10.0 7.5  NEUTROABS 8.2* 5.6  HGB 11.1* 10.0*  HCT 34.1* 32.2*  MCV 94.5 97.0  PLT PLATELET CLUMPS NOTED ON SMEAR, COUNT APPEARS ADEQUATE 725*   Basic Metabolic Panel: Recent Labs  Lab 10/12/19 1449 10/12/19 1458 10/13/19 0351  NA 128*  --  138  K 3.0*  --  4.2  CL 93*  --  106  CO2 22  --  19*  GLUCOSE 116*  --  112*  BUN 17  --  15  CREATININE 1.34*  --  1.29*  CALCIUM 8.1*  --  7.7*  MG  --  1.8  --   PHOS  --  2.0*  --    GFR: Estimated Creatinine Clearance: 39.8 mL/min (A) (by C-G formula based on SCr of 1.29 mg/dL (H)).   Liver Function Tests: Recent Labs   Lab 10/12/19 1449 10/13/19 0351  AST 20 17  ALT 11 9  ALKPHOS 73 60  BILITOT 1.1 0.8  PROT 7.2 6.1*  ALBUMIN 3.6 2.9*   Recent Labs  Lab 10/12/19 1449  LIPASE 17   Urine analysis:    Component Value Date/Time   COLORURINE AMBER (A) 10/12/2019 1730   APPEARANCEUR HAZY (A) 10/12/2019 1730   APPEARANCEUR Clear 12/27/2018 0928   LABSPEC 1.011 10/12/2019 1730   PHURINE 5.0 10/12/2019 1730   GLUCOSEU NEGATIVE 10/12/2019 1730   HGBUR MODERATE (A) 10/12/2019 1730   BILIRUBINUR NEGATIVE 10/12/2019 1730   BILIRUBINUR Negative 12/27/2018 0928   KETONESUR NEGATIVE 10/12/2019 1730   PROTEINUR 100 (A) 10/12/2019 1730   UROBILINOGEN negative 09/10/2015 1624   UROBILINOGEN 0.2 04/30/2015 0346   NITRITE POSITIVE (A) 10/12/2019 1730   LEUKOCYTESUR SMALL (A) 10/12/2019 1730    Recent Results (from the past 240 hour(s))  Blood culture (routine x 2)     Status: None (Preliminary result)   Collection Time: 10/12/19  4:52 PM   Specimen: BLOOD LEFT ARM  Result Value Ref  Range Status   Specimen Description BLOOD LEFT ARM  Final   Special Requests   Final    BOTTLES DRAWN AEROBIC AND ANAEROBIC Blood Culture adequate volume   Culture  Setup Time   Final    GRAM NEGATIVE RODS Gram Stain Report Called to,Read Back By and Verified With: HYLTON,L ON 10/13/19 AT 1800 BY LOY,C AEROBIC BOTTLE PERFORMED AT [facility]    Culture   Final    NO GROWTH < 24 HOURS Performed at [facility], [address] [place], [place] [address]    Report Status PENDING  Incomplete  Blood culture (routine x 2)     Status: None (Preliminary result)   Collection Time: 10/12/19  5:08 PM   Specimen: Left Antecubital; Blood  Result Value Ref Range Status   Specimen Description LEFT ANTECUBITAL  Final   Special Requests   Final    BOTTLES DRAWN AEROBIC AND ANAEROBIC Blood Culture results may not be optimal due to an excessive volume of blood received in culture bottles   Culture   Final    NO GROWTH < 24  HOURS Performed at [facility], [address] [place], [place] [address]    Report Status PENDING  Incomplete  SARS CORONAVIRUS 2 (TAT 6-24 HRS) Nasopharyngeal Nasopharyngeal Swab     Status: None   Collection Time: 10/12/19  7:34 PM   Specimen: Nasopharyngeal Swab  Result Value Ref Range Status   SARS Coronavirus 2 NEGATIVE NEGATIVE Final    Comment: (NOTE) SARS-CoV-2 target nucleic acids are NOT DETECTED. The SARS-CoV-2 RNA is generally detectable in upper and lower respiratory specimens during the acute phase of infection. Negative results do not preclude SARS-CoV-2 infection, do not rule out co-infections with other pathogens, and should not be used as the sole basis for treatment or other patient management decisions. Negative results must be combined with clinical observations, patient history, and epidemiological information. The expected result is Negative. Fact Sheet for Patients: SugarRoll.be Fact Sheet for Healthcare Providers: https://www.woods-mathews.com/ This test is not yet approved or cleared by the [place] FDA and  has been authorized for detection and/or diagnosis of SARS-CoV-2 by FDA under an Emergency Use Authorization (EUA). This EUA will remain  in effect (meaning this test can be used) for the duration of the COVID-19 declaration under Section 56 4(b)(1) of the Act, 21 U.S.C. section 360bbb-3(b)(1), unless the authorization is terminated or revoked sooner. Performed at [facility], [place] [address], [place], [place] [address]   MRSA PCR Screening     Status: None   Collection Time: 10/12/19 11:57 PM   Specimen: Nasal Mucosa; Nasopharyngeal  Result Value Ref Range Status   MRSA by PCR NEGATIVE NEGATIVE Final    Comment:        The GeneXpert MRSA Assay (FDA approved for NASAL specimens only), is one component of a comprehensive MRSA colonization surveillance program. It is not intended to diagnose  MRSA infection nor to guide or monitor treatment for MRSA infections. Performed at [facility], [address] [place],  [address]       Radiology Studies: Ct Abdomen Pelvis W Contrast  Result Date: 10/12/2019 CLINICAL DATA:  Abdominal pain. Urinary and bowel incontinence. Emesis. EXAM: CT ABDOMEN AND PELVIS WITH CONTRAST TECHNIQUE: Multidetector CT imaging of the abdomen and pelvis was performed using the standard protocol following bolus administration of intravenous contrast. CONTRAST:  19m OMNIPAQUE IOHEXOL 300 MG/ML  SOLN COMPARISON:  06/28/2016 FINDINGS: Lower chest: Choose 1 Hepatobiliary: Liver normal  in size and overall attenuation. 11 mm low-attenuation lesion in right lobe, segment 7, not well-defined on the prior CT that was performed without contrast. This is likely a hemangioma. No other liver masses or lesions. Small gallstones suggested dependently a mildly distended gallbladder. No wall thickening or inflammation. No bile duct dilation. Pancreas: Unremarkable. No pancreatic ductal dilatation or surrounding inflammatory changes. Spleen: Prominent spleen, 12 cm in long axis increased from 10 cm on the prior CT. No splenic mass or focal lesion. Adrenals/Urinary Tract: No adrenal masses. Bilateral renal cortical thinning. There are multiple areas of relative decreased renal enhancement bilaterally, most evident on the delayed sequence. No defined renal masses, no stones and no hydronephrosis. Mild stranding in the perinephric spaces, without a perinephric fluid collection. Ureters are normal in course and in caliber. Bladder is unremarkable. Stomach/Bowel: Unremarkable stomach. Colon and small bowel are normal in caliber. Mild wall thickening of the terminal ileum. No other small bowel wall thickening. There is mild wall prominence at the ileocecal valve, in the right colon. Remaining portions of the colon shows normal wall thickness. No mesenteric inflammation. Vascular/Lymphatic:  Aortic atherosclerosis. No aneurysm. There is plaque at the origin of the renal arteries, but no definite hemodynamically significant stenosis. No enlarged lymph nodes. Reproductive: Uterus and bilateral adnexa are unremarkable. Other: No abdominal wall hernia or abnormality. No abdominopelvic ascites. Musculoskeletal: Compression fractures of T12 and L1. Slight depression of the upper endplate of L2 and lower endplate of L3. Small node along the upper endplate of L4. The compression fracture of L1 has increased in severity from the prior CT. The compression fracture of T12 is new. These do not appear acute or recent, however. No other fractures. No osteoblastic or osteolytic bone lesions. IMPRESSION: 1. Multiple areas of decreased enhancement in the kidneys, best seen on the delayed sequence. This is consistent with bilateral pyelonephritis in the proper clinical setting. No evidence of a renal or perinephric abscess. No hydronephrosis. 2. Mild wall thickening of the terminal ileum and right colon at the ileocecal valve, without adjacent inflammation. Consider infectious or inflammatory ileitis. 3. No other evidence of an acute abnormality. 4. Aortic atherosclerosis. 5. Liver lesion that is most likely a hemangioma. 6. Spleen appears borderline enlarged and larger than it was previously. Etiology of this is unclear. Electronically Signed   By: Lajean Manes M.D.   On: 10/12/2019 19:28   Dg Chest Portable 1 View  Result Date: 10/12/2019 CLINICAL DATA:  Shortness of breath EXAM: PORTABLE CHEST 1 VIEW COMPARISON:  Portable exam 2110 hours compared to 1554 hours FINDINGS: Normal heart size, mediastinal contours, and pulmonary vascularity. Atherosclerotic calcification aorta. Biapical scarring. Lungs otherwise clear. No acute infiltrate, pleural effusion or pneumothorax. Bones unremarkable. IMPRESSION: No acute abnormalities. Electronically Signed   By: Lavonia Dana M.D.   On: 10/12/2019 21:32   Dg Chest Portable 1  View  Result Date: 10/12/2019 CLINICAL DATA:  Fever, abdominal pain. EXAM: PORTABLE CHEST 1 VIEW COMPARISON:  Chest x-rays dated 05/18/2017 and 10/18/2016. FINDINGS: Heart size and mediastinal contours are within normal limits. Biapical pleuroparenchymal scarring/fibrosis. Lungs otherwise clear. No confluent opacity to suggest a developing pneumonia. No pleural effusion. Osseous structures about the chest are unremarkable. IMPRESSION: No active cardiopulmonary disease. No evidence of pneumonia or pulmonary edema. Electronically Signed   By: Franki Cabot M.D.   On: 10/12/2019 16:14    Scheduled Meds:  aspirin EC  81 mg Oral Daily   Chlorhexidine Gluconate Cloth  6 each Topical Daily  DULoxetine  60 mg Oral Daily   folic acid  8,295 mcg Oral Daily   levothyroxine  112 mcg Oral Daily   liothyronine  10 mcg Oral Daily   mouth rinse  15 mL Mouth Rinse BID   rosuvastatin  10 mg Oral Daily   thiamine  100 mg Oral Daily   Continuous Infusions:  cefTRIAXone (ROCEPHIN)  IV Stopped (10/13/19 1801)     LOS: 1 day    Time spent: 35 minutes. Greater than 50% of this time was spent in direct contact with the patient, coordinating care and discussing relevant ongoing clinical issues, including sepsis presentation in the setting of UTI, dehydration and positive blood cx's.Barton Dubois, MD [facility] Pager [phone]   10/13/2019, 7:23 PM

## 2019-10-13 NOTE — Progress Notes (Signed)
Gram negative rods in aerobic bottle blood culture text Dr. Dyann Kief. Stay with current medications.

## 2019-10-14 DIAGNOSIS — B9620 Unspecified Escherichia coli [E. coli] as the cause of diseases classified elsewhere: Secondary | ICD-10-CM

## 2019-10-14 DIAGNOSIS — R7881 Bacteremia: Secondary | ICD-10-CM

## 2019-10-14 DIAGNOSIS — E782 Mixed hyperlipidemia: Secondary | ICD-10-CM

## 2019-10-14 LAB — CBC WITH DIFFERENTIAL/PLATELET
Abs Immature Granulocytes: 0.05 10*3/uL (ref 0.00–0.07)
Basophils Absolute: 0 10*3/uL (ref 0.0–0.1)
Basophils Relative: 0 %
Eosinophils Absolute: 0.1 10*3/uL (ref 0.0–0.5)
Eosinophils Relative: 2 %
HCT: 30.4 % — ABNORMAL LOW (ref 36.0–46.0)
Hemoglobin: 9.6 g/dL — ABNORMAL LOW (ref 12.0–15.0)
Immature Granulocytes: 1 %
Lymphocytes Relative: 18 %
Lymphs Abs: 1 10*3/uL (ref 0.7–4.0)
MCH: 30.5 pg (ref 26.0–34.0)
MCHC: 31.6 g/dL (ref 30.0–36.0)
MCV: 96.5 fL (ref 80.0–100.0)
Monocytes Absolute: 0.8 10*3/uL (ref 0.1–1.0)
Monocytes Relative: 15 %
Neutro Abs: 3.4 10*3/uL (ref 1.7–7.7)
Neutrophils Relative %: 64 %
Platelets: 120 10*3/uL — ABNORMAL LOW (ref 150–400)
RBC: 3.15 MIL/uL — ABNORMAL LOW (ref 3.87–5.11)
RDW: 13.7 % (ref 11.5–15.5)
WBC: 5.4 10*3/uL (ref 4.0–10.5)
nRBC: 0 % (ref 0.0–0.2)

## 2019-10-14 MED ORDER — SODIUM CHLORIDE 0.9 % IV SOLN
INTRAVENOUS | Status: DC
  Administered 2019-10-14: 23:00:00 via INTRAVENOUS

## 2019-10-14 MED ORDER — SODIUM CHLORIDE 0.9 % IV SOLN
2.0000 g | INTRAVENOUS | Status: DC
  Administered 2019-10-14 – 2019-10-15 (×2): 2 g via INTRAVENOUS
  Filled 2019-10-14 (×2): qty 20

## 2019-10-14 NOTE — Progress Notes (Signed)
PROGRESS NOTE    [name]  OIN:[id number] DOB: 07-24-1946 DOA: 10/12/2019 PCP: Claretta Fraise, MD     Brief Narrative:  As per H&P written by Dr. Olevia Bowens on 10/12/19 73 y.o. female with medical history significant of alcoholism, anemia, anxiety/depression, osteoarthritis of left knee, B12 deficiency, right carotid artery narrowing, stage II chronic kidney disease, COPD, headaches, hyperlipidemia, hypertension, hyponatremia, hypothyroidism, history of TIA, history of orthostatic syncope, history of pneumonia, history of SBO who is coming to the emergency department with complaints of 5 days of progressively worse urinary urgency, lower abdominal pain and dysuria.  She denies flank pain or hematuria.  She has had multiple episodes of emesis and diarrhea for the past 3 days.  She denies melena or hematochezia.  She did try over-the-counter Azo with some improvement, but did not take away completely the symptoms.  She denies fevers, but complains of chills, night sweats, decreased appetite, fatigue and malaise.  No headache, rhinorrhea, sore throat, wheezing or hemoptysis.  Denies chest pain, dyspnea, palpitations, PND, orthopnea or recent pitting edema of the lower extremities.  No polyuria, polydipsia, polyphagia or blurred vision.  ED Course: Initial vital signs were temperature 100.4 F, pulse 103, respiration 18, blood pressure 153/70 mmHg and O2 sat 100% on room air.  Patient was given Rocephin and a total of 1500 mL of normal saline.  While in the ER, the patient had an episode of tachycardiac in the 130s and 140s and received lorazepam and IV fluids, which improved her heart rate.   Assessment & Plan: 1-Sepsis secondary to E. Coli UTI and bacteremia ([place]) -will continue IV antibiotics -follow culture results and sensitivity -continue IVF's and supportive care. -patient met sepsis criteria on admission.  2-Essential hypertension -BP soft and stable -continue holding antihypertensive  agents for now.  3-Hyperlipidemia -Continue statins.   4-Anemia -no signs of overt bleeding -will follow Hgb trend   5-thrombocytopenia -in the setting of chronic alcohol use and sepsis most likely -no bleeding appreciated -will avoid heparin products -follow platelets count trend.  6-hyponatremia and Hypokalemia -will continue IVF's and electrolyte repletion -follow trend. -K now repleted; no need for further telemetry monitoring   7-Hypothyroidism -continue synthroid and cytomel   8-Lactic acidosis -in the setting of dehydration and infection -continue IVF's -repeat/followed lactic acid back to normal now.  9-Acute renal failure -pre-renal in nature due to dehydration  -UTI also playing a role -continue ivf's and and abx's therapy for infection -follow renal function trend; trending down.   10-hx of alcohol abuse -no active withdrawal appreciated -per patient no significant alcohol usage in months -continue thiamine   11-depression -continue cymbalta. -No suicidal ideation or hallucination.   DVT prophylaxis: SCD's. Code Status: Full Code. Family Communication: no family at bedside. Disposition Plan: remains inpatient, continue IV antibiotics, follow culture results and assess clinical response.  Patient will be transferred to [facility] bed.  Consultants:  None   Procedures:   See below for x-ray reports.  Antimicrobials:  Anti-infectives (From admission, onward)   Start     Dose/Rate Route Frequency Ordered Stop   10/14/19 1400  cefTRIAXone (ROCEPHIN) 2 g in sodium chloride 0.9 % 100 mL IVPB     2 g 200 mL/hr over 30 Minutes Intravenous Every 24 hours 10/14/19 0844     10/12/19 1830  cefTRIAXone (ROCEPHIN) 1 g in sodium chloride 0.9 % 100 mL IVPB     1 g 200 mL/hr over 30 Minutes Intravenous  Once 10/12/19 1824 10/12/19 2056  10/12/19 1830  cefTRIAXone (ROCEPHIN) 1 g in sodium chloride 0.9 % 100 mL IVPB  Status:  Discontinued     1 g 200 mL/hr over  30 Minutes Intravenous Every 24 hours 10/12/19 1954 10/14/19 0844       Subjective: Feeling weak and tired.  Reports no chest pain or shortness of breath.  Currently afebrile.  No nausea, no vomiting, no dysuria.  Objective: Vitals:   10/14/19 1300 10/14/19 1400 10/14/19 1500 10/14/19 1607  BP: 121/73 113/67 122/78   Pulse: 97 98 95 95  Resp: _0 Temp:    98.9 F (37.2 C)  TempSrc:    Oral  SpO2: 97% 97% 100% 98%  Weight:      Height:        Intake/Output Summary (Last 24 hours) at 10/14/2019 1729 Last data filed at 10/14/2019 1532 Gross per 24 hour  Intake 111.52 ml  Output --  Net 111.52 ml   Filed Weights   10/12/19 2356 10/13/19 0500 10/14/19 0500  Weight: 73.4 kg 73.4 kg 74 kg    Examination: General exam: Alert, awake, oriented x 3; currently afebrile, no chest pain, no nausea, no vomiting.  Has tolerated diet without problems.  Still feeling weak and tired. Respiratory system: Clear to auscultation. Respiratory effort normal. Cardiovascular system:RRR. No murmurs, rubs, gallops. Gastrointestinal system: Abdomen is nondistended, soft and nontender. No organomegaly or masses felt. Normal bowel sounds heard. Central nervous system: Alert and oriented. No focal neurological deficits. Extremities: No cyanosis or clubbing. Skin: No rashes, lesions or ulcers Psychiatry: Judgement and insight appear normal. Mood & affect appropriate.    Data Reviewed: I have personally reviewed following labs and imaging studies  CBC: Recent Labs  Lab 10/12/19 1449 10/13/19 0351 10/14/19 0417  WBC 10.0 7.5 5.4  NEUTROABS 8.2* 5.6 3.4  HGB 11.1* 10.0* 9.6*  HCT 34.1* 32.2* 30.4*  MCV 94.5 97.0 96.5  PLT PLATELET CLUMPS NOTED ON SMEAR, COUNT APPEARS ADEQUATE 102* 354*   Basic Metabolic Panel: Recent Labs  Lab 10/12/19 1449 10/12/19 1458 10/13/19 0351  NA 128*  --  138  K 3.0*  --  4.2  CL 93*  --  106  CO2 22  --  19*  GLUCOSE 116*  --  112*  BUN 17  --  15   CREATININE 1.34*  --  1.29*  CALCIUM 8.1*  --  7.7*  MG  --  1.8  --   PHOS  --  2.0*  --    GFR: Estimated Creatinine Clearance: 39.8 mL/min (A) (by C-G formula based on SCr of 1.29 mg/dL (H)).   Liver Function Tests: Recent Labs  Lab 10/12/19 1449 10/13/19 0351  AST 20 17  ALT 11 9  ALKPHOS 73 60  BILITOT 1.1 0.8  PROT 7.2 6.1*  ALBUMIN 3.6 2.9*   Recent Labs  Lab 10/12/19 1449  LIPASE 17   Urine analysis:    Component Value Date/Time   COLORURINE AMBER (A) 10/12/2019 1730   APPEARANCEUR HAZY (A) 10/12/2019 1730   APPEARANCEUR Clear 12/27/2018 0928   LABSPEC 1.011 10/12/2019 1730   PHURINE 5.0 10/12/2019 1730   GLUCOSEU NEGATIVE 10/12/2019 1730   HGBUR MODERATE (A) 10/12/2019 1730   BILIRUBINUR NEGATIVE 10/12/2019 1730   BILIRUBINUR Negative 12/27/2018 0928   KETONESUR NEGATIVE 10/12/2019 1730   PROTEINUR 100 (A) 10/12/2019 1730   UROBILINOGEN negative 09/10/2015 1624   UROBILINOGEN 0.2 04/30/2015 0346   NITRITE POSITIVE (A) 10/12/2019 1730  LEUKOCYTESUR SMALL (A) 10/12/2019 1730    Recent Results (from the past 240 hour(s))  Blood culture (routine x 2)     Status: Abnormal (Preliminary result)   Collection Time: 10/12/19  4:52 PM   Specimen: BLOOD LEFT ARM  Result Value Ref Range Status   Specimen Description   Final    BLOOD LEFT ARM Performed at [facility], [address] [place], [place] [address]    Special Requests   Final    BOTTLES DRAWN AEROBIC AND ANAEROBIC Blood Culture adequate volume Performed at [facility], [address] [place], [place] [address]    Culture  Setup Time   Final    GRAM NEGATIVE RODS Gram Stain Report Called to,Read Back By and Verified With: HYLTON,L ON 10/13/19 AT 1800 BY LOY,C AEROBIC BOTTLE ONLY PERFORMED AT [facility] Organism ID to follow CRITICAL RESULT CALLED TO, READ BACK BY AND VERIFIED WITHMiquel Dunn RN [id number] 10/13/19 A BROWNING Performed at [facility], [place] [address], [place], [place] [address]     Culture ESCHERICHIA COLI (A)  Final   Report Status PENDING  Incomplete  Blood Culture ID Panel (Reflexed)     Status: Abnormal   Collection Time: 10/12/19  4:52 PM  Result Value Ref Range Status   Enterococcus species NOT DETECTED NOT DETECTED Final   Listeria monocytogenes NOT DETECTED NOT DETECTED Final   Staphylococcus species NOT DETECTED NOT DETECTED Final   Staphylococcus aureus (BCID) NOT DETECTED NOT DETECTED Final   Streptococcus species NOT DETECTED NOT DETECTED Final   Streptococcus agalactiae NOT DETECTED NOT DETECTED Final   Streptococcus pneumoniae NOT DETECTED NOT DETECTED Final   Streptococcus pyogenes NOT DETECTED NOT DETECTED Final   Acinetobacter baumannii NOT DETECTED NOT DETECTED Final   Enterobacteriaceae species DETECTED (A) NOT DETECTED Final    Comment: Enterobacteriaceae represent a large family of gram-negative bacteria, not a single organism. CRITICAL RESULT CALLED TO, READ BACK BY AND VERIFIED WITH: Miquel Dunn RN [id number] 10/13/19 A BROWNING    Enterobacter cloacae complex NOT DETECTED NOT DETECTED Final   Escherichia coli DETECTED (A) NOT DETECTED Final    Comment: CRITICAL RESULT CALLED TO, READ BACK BY AND VERIFIED WITH: Miquel Dunn RN [id number] 10/13/19 A BROWNING    Klebsiella oxytoca NOT DETECTED NOT DETECTED Final   Klebsiella pneumoniae NOT DETECTED NOT DETECTED Final   Proteus species NOT DETECTED NOT DETECTED Final   Serratia marcescens NOT DETECTED NOT DETECTED Final   Carbapenem resistance NOT DETECTED NOT DETECTED Final   Haemophilus influenzae NOT DETECTED NOT DETECTED Final   Neisseria meningitidis NOT DETECTED NOT DETECTED Final   Pseudomonas aeruginosa NOT DETECTED NOT DETECTED Final   Candida albicans NOT DETECTED NOT DETECTED Final   Candida glabrata NOT DETECTED NOT DETECTED Final   Candida krusei NOT DETECTED NOT DETECTED Final   Candida parapsilosis NOT DETECTED NOT DETECTED Final   Candida tropicalis NOT DETECTED NOT DETECTED Final    Comment:  Performed at [facility], [place] [address], [place], [place] [address]  Blood culture (routine x 2)     Status: None (Preliminary result)   Collection Time: 10/12/19  5:08 PM   Specimen: Left Antecubital; Blood  Result Value Ref Range Status   Specimen Description LEFT ANTECUBITAL  Final   Special Requests   Final    BOTTLES DRAWN AEROBIC AND ANAEROBIC Blood Culture results may not be optimal due to an excessive volume of blood received in culture bottles   Culture  Final    NO GROWTH 2 DAYS Performed at [facility], [address] [place], [place] [address]    Report Status PENDING  Incomplete  SARS CORONAVIRUS 2 (TAT 6-24 HRS) Nasopharyngeal Nasopharyngeal Swab     Status: None   Collection Time: 10/12/19  7:34 PM   Specimen: Nasopharyngeal Swab  Result Value Ref Range Status   SARS Coronavirus 2 NEGATIVE NEGATIVE Final    Comment: (NOTE) SARS-CoV-2 target nucleic acids are NOT DETECTED. The SARS-CoV-2 RNA is generally detectable in upper and lower respiratory specimens during the acute phase of infection. Negative results do not preclude SARS-CoV-2 infection, do not rule out co-infections with other pathogens, and should not be used as the sole basis for treatment or other patient management decisions. Negative results must be combined with clinical observations, patient history, and epidemiological information. The expected result is Negative. Fact Sheet for Patients: SugarRoll.be Fact Sheet for Healthcare Providers: https://www.woods-mathews.com/ This test is not yet approved or cleared by the [place] FDA and  has been authorized for detection and/or diagnosis of SARS-CoV-2 by FDA under an Emergency Use Authorization (EUA). This EUA will remain  in effect (meaning this test can be used) for the duration of the COVID-19 declaration under Section 56 4(b)(1) of the Act, 21 U.S.C. section 360bbb-3(b)(1), unless the  authorization is terminated or revoked sooner. Performed at [facility], [place] [address], [place], [place] [address]   MRSA PCR Screening     Status: None   Collection Time: 10/12/19 11:57 PM   Specimen: Nasal Mucosa; Nasopharyngeal  Result Value Ref Range Status   MRSA by PCR NEGATIVE NEGATIVE Final    Comment:        The GeneXpert MRSA Assay (FDA approved for NASAL specimens only), is one component of a comprehensive MRSA colonization surveillance program. It is not intended to diagnose MRSA infection nor to guide or monitor treatment for MRSA infections. Performed at [facility], [address], [place], [place] [address]       Radiology Studies: Ct Abdomen Pelvis W Contrast  Result Date: 10/12/2019 CLINICAL DATA:  Abdominal pain. Urinary and bowel incontinence. Emesis. EXAM: CT ABDOMEN AND PELVIS WITH CONTRAST TECHNIQUE: Multidetector CT imaging of the abdomen and pelvis was performed using the standard protocol following bolus administration of intravenous contrast. CONTRAST:  46m OMNIPAQUE IOHEXOL 300 MG/ML  SOLN COMPARISON:  06/28/2016 FINDINGS: Lower chest: Choose 1 Hepatobiliary: Liver normal in size and overall attenuation. 11 mm low-attenuation lesion in right lobe, segment 7, not well-defined on the prior CT that was performed without contrast. This is likely a hemangioma. No other liver masses or lesions. Small gallstones suggested dependently a mildly distended gallbladder. No wall thickening or inflammation. No bile duct dilation. Pancreas: Unremarkable. No pancreatic ductal dilatation or surrounding inflammatory changes. Spleen: Prominent spleen, 12 cm in long axis increased from 10 cm on the prior CT. No splenic mass or focal lesion. Adrenals/Urinary Tract: No adrenal masses. Bilateral renal cortical thinning. There are multiple areas of relative decreased renal enhancement bilaterally, most evident on the delayed sequence. No defined renal masses, no stones and no  hydronephrosis. Mild stranding in the perinephric spaces, without a perinephric fluid collection. Ureters are normal in course and in caliber. Bladder is unremarkable. Stomach/Bowel: Unremarkable stomach. Colon and small bowel are normal in caliber. Mild wall thickening of the terminal ileum. No other small bowel wall thickening. There is mild wall prominence at the ileocecal valve, in the right colon. Remaining portions of the  colon shows normal wall thickness. No mesenteric inflammation. Vascular/Lymphatic: Aortic atherosclerosis. No aneurysm. There is plaque at the origin of the renal arteries, but no definite hemodynamically significant stenosis. No enlarged lymph nodes. Reproductive: Uterus and bilateral adnexa are unremarkable. Other: No abdominal wall hernia or abnormality. No abdominopelvic ascites. Musculoskeletal: Compression fractures of T12 and L1. Slight depression of the upper endplate of L2 and lower endplate of L3. Small node along the upper endplate of L4. The compression fracture of L1 has increased in severity from the prior CT. The compression fracture of T12 is new. These do not appear acute or recent, however. No other fractures. No osteoblastic or osteolytic bone lesions. IMPRESSION: 1. Multiple areas of decreased enhancement in the kidneys, best seen on the delayed sequence. This is consistent with bilateral pyelonephritis in the proper clinical setting. No evidence of a renal or perinephric abscess. No hydronephrosis. 2. Mild wall thickening of the terminal ileum and right colon at the ileocecal valve, without adjacent inflammation. Consider infectious or inflammatory ileitis. 3. No other evidence of an acute abnormality. 4. Aortic atherosclerosis. 5. Liver lesion that is most likely a hemangioma. 6. Spleen appears borderline enlarged and larger than it was previously. Etiology of this is unclear. Electronically Signed   By: Lajean Manes M.D.   On: 10/12/2019 19:28   Dg Chest Portable 1  View  Result Date: 10/12/2019 CLINICAL DATA:  Shortness of breath EXAM: PORTABLE CHEST 1 VIEW COMPARISON:  Portable exam 2110 hours compared to 1554 hours FINDINGS: Normal heart size, mediastinal contours, and pulmonary vascularity. Atherosclerotic calcification aorta. Biapical scarring. Lungs otherwise clear. No acute infiltrate, pleural effusion or pneumothorax. Bones unremarkable. IMPRESSION: No acute abnormalities. Electronically Signed   By: Lavonia Dana M.D.   On: 10/12/2019 21:32    Scheduled Meds:  aspirin EC  81 mg Oral Daily   Chlorhexidine Gluconate Cloth  6 each Topical Daily   DULoxetine  60 mg Oral Daily   folic acid  3,888 mcg Oral Daily   levothyroxine  112 mcg Oral Daily   liothyronine  10 mcg Oral Daily   mouth rinse  15 mL Mouth Rinse BID   rosuvastatin  10 mg Oral Daily   thiamine  100 mg Oral Daily   Continuous Infusions:  cefTRIAXone (ROCEPHIN)  IV 200 mL/hr at 10/14/19 1532     LOS: 2 days    Time spent: 30 minutes.    Barton Dubois, MD [facility] Pager [phone]   10/14/2019, 5:29 PM

## 2019-10-15 DIAGNOSIS — N179 Acute kidney failure, unspecified: Secondary | ICD-10-CM

## 2019-10-15 DIAGNOSIS — N10 Acute tubulo-interstitial nephritis: Secondary | ICD-10-CM

## 2019-10-15 DIAGNOSIS — K529 Noninfective gastroenteritis and colitis, unspecified: Secondary | ICD-10-CM

## 2019-10-15 DIAGNOSIS — N182 Chronic kidney disease, stage 2 (mild): Secondary | ICD-10-CM

## 2019-10-15 DIAGNOSIS — D696 Thrombocytopenia, unspecified: Secondary | ICD-10-CM

## 2019-10-15 DIAGNOSIS — R7881 Bacteremia: Secondary | ICD-10-CM

## 2019-10-15 DIAGNOSIS — A4151 Sepsis due to Escherichia coli [E. coli]: Principal | ICD-10-CM

## 2019-10-15 HISTORY — DX: Acute pyelonephritis: N10

## 2019-10-15 HISTORY — DX: Chronic kidney disease, stage 2 (mild): N18.2

## 2019-10-15 HISTORY — DX: Chronic kidney disease, stage 2 (mild): N17.9

## 2019-10-15 HISTORY — DX: Thrombocytopenia, unspecified: D69.6

## 2019-10-15 LAB — BASIC METABOLIC PANEL
Anion gap: 10 (ref 5–15)
BUN: 7 mg/dL — ABNORMAL LOW (ref 8–23)
CO2: 23 mmol/L (ref 22–32)
Calcium: 8 mg/dL — ABNORMAL LOW (ref 8.9–10.3)
Chloride: 104 mmol/L (ref 98–111)
Creatinine, Ser: 1.14 mg/dL — ABNORMAL HIGH (ref 0.44–1.00)
GFR calc Af Amer: 56 mL/min — ABNORMAL LOW (ref >60)
GFR calc non Af Amer: 48 mL/min — ABNORMAL LOW (ref >60)
Glucose, Bld: 107 mg/dL — ABNORMAL HIGH (ref 70–99)
Potassium: 3.1 mmol/L — ABNORMAL LOW (ref 3.5–5.1)
Sodium: 137 mmol/L (ref 135–145)

## 2019-10-15 LAB — CBC
HCT: 29.6 % — ABNORMAL LOW (ref 36.0–46.0)
Hemoglobin: 9.3 g/dL — ABNORMAL LOW (ref 12.0–15.0)
MCH: 30 pg (ref 26.0–34.0)
MCHC: 31.4 g/dL (ref 30.0–36.0)
MCV: 95.5 fL (ref 80.0–100.0)
Platelets: 130 10*3/uL — ABNORMAL LOW (ref 150–400)
RBC: 3.1 MIL/uL — ABNORMAL LOW (ref 3.87–5.11)
RDW: 13.4 % (ref 11.5–15.5)
WBC: 5.1 10*3/uL (ref 4.0–10.5)
nRBC: 0 % (ref 0.0–0.2)

## 2019-10-15 LAB — CULTURE, BLOOD (ROUTINE X 2): Special Requests: ADEQUATE

## 2019-10-15 MED ORDER — METRONIDAZOLE IN NACL 5-0.79 MG/ML-% IV SOLN
500.0000 mg | Freq: Three times a day (TID) | INTRAVENOUS | Status: DC
  Administered 2019-10-15 – 2019-10-16 (×3): 500 mg via INTRAVENOUS
  Filled 2019-10-15 (×3): qty 100

## 2019-10-15 MED ORDER — POTASSIUM CHLORIDE CRYS ER 20 MEQ PO TBCR
40.0000 meq | EXTENDED_RELEASE_TABLET | Freq: Once | ORAL | Status: AC
  Administered 2019-10-15: 40 meq via ORAL
  Filled 2019-10-15: qty 2

## 2019-10-15 MED ORDER — POTASSIUM CHLORIDE IN NACL 20-0.9 MEQ/L-% IV SOLN
INTRAVENOUS | Status: AC
  Administered 2019-10-15 – 2019-10-16 (×2): via INTRAVENOUS

## 2019-10-15 NOTE — Consult Note (Signed)
Referring Provider: No ref. provider found Primary Care Physician:  Claretta Fraise, MD Primary Gastroenterologist:  Dr. Gala Romney  Reason for Consultation:    Wall thickening to terminal ileum.  HPI:   Patient is 73 year old Caucasian female who presented 3 days ago with few day history of lower abdominal pain increased urinary frequency with urgency and incontinence as well as diarrhea and she had single episode of vomiting and diminished intake for 5 days. Work-up in emergency room revealed abnormal urinalysis and lactic acidosis.  She was also felt to be dehydrated and had hyponatremia.  CT scan was obtained and revealed focal wall thickening to terminal ileal mucosa along with other findings.  Hence reason for consultation. Blood cultures were positive for E. coli.  Patient has responded to IV antibiotic. Patient states she had soft stool yesterday.  She has not had a bowel movement today.  She states her appetite is back.  She says abdominal pain has resolved.  She is not incontinent of urine anymore. Patient has history of extensive ulceration to ascending colon and cecum back in January 2017.  Dr. Gala Romney felt that this injury was secondary to NSAID use.  She reassures me that she does not take any NSAIDs.  She just takes 2 Tylenol per day.  She has bilateral knee pain secondary to osteoarthrosis.  She has worse pain on the left side.  She states she had arthroscopy 3 years ago but it did not help.  She also complains of chronic back pain.  She states pain started when she fell at school.  She was 73 years old.  She remembers her mother asked her to wear support.  He continues to have back pain.  She is not aware that she has had compression fractures of vertebrae.  She does not know whether she has osteoporosis. No history of recent antibiotic use.  She drinks city water.  Patient is retired.  She lives alone.  She lost her husband due to lung carcinoma.  She has 2 grownup children.  Both her son  and daughter in good health.  She smokes cigarettes for 20 years 1 to 2 packs/day.  She states she drinks alcohol occasionally.  .   Past Medical History:  Diagnosis Date  .    Marland Kitchen Anemia   . Anxiety   . Arthritis    Left knee  . B12 deficiency   . Carotid artery narrowing 10/03/2012   On the right.  . Chronic kidney disease (CKD), stage II (mild)   . COPD (chronic obstructive pulmonary disease) ([place])   . Depression   . Headache(784.0)   . Hyperlipidemia   . Hypertension   . Hyponatremia 01/09/2012  . Hypothyroid   . Menopause   . Mini stroke ([place])   . Orthostatic syncope 10/02/2012  . Pneumonia   . SBO (small bowel obstruction) ([place]) 06/28/2016  . Shortness of breath   . Stroke [facility]) 2010   no deficits    Past Surgical History:  Procedure Laterality Date  . AGILE CAPSULE N/A 02/22/2016   dummy capsule remained in the distal ileum  . APPENDECTOMY    . CATARACT EXTRACTION W/PHACO  06/20/2012   Procedure: CATARACT EXTRACTION PHACO AND INTRAOCULAR LENS PLACEMENT (IOC);  Surgeon: Tonny Branch, MD;  Location: [facility];  Service: Ophthalmology;  Laterality: Right;  CDE:10.66  . CATARACT EXTRACTION W/PHACO  07/11/2012   Procedure: CATARACT EXTRACTION PHACO AND INTRAOCULAR LENS PLACEMENT (IOC);  Surgeon: Tonny Branch, MD;  Location: [facility];  Service:  Ophthalmology;  Laterality: Left;  CDE: 12.69  . COLONOSCOPY N/A 12/17/2015   RMR: Marked circumferential ulceration of the ascending colon/Ileocecal valve ulceration with biopsy most consistent with NSAID injury versus inflammatory bowel disease, pancolonoc diverticulosis redundant colon  . ESOPHAGOGASTRODUODENOSCOPY N/A 12/17/2015   [id number] gastric polyp, biopsy benign  . KNEE ARTHROSCOPY Right 09/2016  . LUMBAR LAMINECTOMY/DECOMPRESSION MICRODISCECTOMY N/A 05/25/2017   Procedure: CENTRAL DECOMPRESSIVE LUMBAR LAMINECTOMY FOR SPINAL STENOSIS L3-L4, FORAMINOTOMY FOR THE L4 ROOT AND L5 ROOT;  Surgeon: Latanya Maudlin, MD;  Location: [facility];   Service: Orthopedics;  Laterality: N/A;  . TONSILLECTOMY      Prior to Admission medications   Medication Sig Start Date End Date Taking? Authorizing Provider  alendronate (FOSAMAX) 70 MG tablet Take 70 mg by mouth once a week. Take with a full glass of water on an empty stomach.   Yes [provider]  aspirin EC 81 MG tablet Take 81 mg by mouth daily.   Yes [provider]  Coenzyme Q10 (CO Q-10) 100 MG CAPS Take 100 mg by mouth daily. 11/15/16  Yes Claretta Fraise, MD  DULoxetine (CYMBALTA) 60 MG capsule Take 1 capsule (60 mg total) by mouth daily. 08/19/19  Yes Stacks, Cletus Gash, MD  folic acid (FOLVITE) [id number] MCG tablet Take 2,000 mcg by mouth daily.   Yes [provider]  levothyroxine (SYNTHROID, LEVOTHROID) 112 MCG tablet Take 1 tablet (112 mcg total) by mouth daily. 12/31/18  Yes Stacks, Cletus Gash, MD  liothyronine (CYTOMEL) 5 MCG tablet Take 2 tablets (10 mcg total) by mouth daily. 04/23/19  Yes Stacks, Cletus Gash, MD  nabumetone (RELAFEN) 500 MG tablet TAKE 2 TABLETS (1,000 MG TOTAL) BY MOUTH 2 (TWO) TIMES DAILY FOR MUSCLE AND JOINT PAIN 05/08/19  Yes Claretta Fraise, MD  olmesartan-hydrochlorothiazide (BENICAR HCT) 40-25 MG tablet Take 1 tablet by mouth daily. 08/25/19  Yes Stacks, Cletus Gash, MD  rosuvastatin (CRESTOR) 10 MG tablet TAKE 1 TABLET EVERY DAY 05/08/19  Yes Claretta Fraise, MD  thiamine (VITAMIN B-1) 100 MG tablet Take 1 tablet (100 mg total) by mouth daily. 04/20/16  Yes Claretta Fraise, MD  acetaminophen (TYLENOL) 500 MG tablet Take 1 tablet (500 mg total) by mouth 2 (two) times daily. Pain Patient not taking: Reported on 10/12/2019 07/02/16   Rexene Alberts, MD  ondansetron (ZOFRAN) 4 MG tablet Take 1 tablet (4 mg total) by mouth every 6 (six) hours as needed for nausea. Patient not taking: Reported on 10/12/2019 04/22/19   Heath Lark D, DO    Current Facility-Administered Medications  Medication Dose Route Frequency Provider Last Rate Last Dose  . 0.9 % NaCl with KCl  20 mEq/ L  infusion   Intravenous Continuous Tat, Shanon Brow, MD 75 mL/hr at 10/15/19 1147    . acetaminophen (TYLENOL) tablet 650 mg  650 mg Oral Q6H PRN Barton Dubois, MD   650 mg at 10/13/19 2016   Or  . acetaminophen (TYLENOL) suppository 650 mg  650 mg Rectal Q6H PRN Barton Dubois, MD      . aspirin EC tablet 81 mg  81 mg Oral Daily Barton Dubois, MD   81 mg at 10/15/19 1102  . cefTRIAXone (ROCEPHIN) 2 g in sodium chloride 0.9 % 100 mL IVPB  2 g Intravenous Q24H Barton Dubois, MD 200 mL/hr at 10/15/19 1605 2 g at 10/15/19 1605  . Chlorhexidine Gluconate Cloth 2 % PADS 6 each  6 each Topical Daily Barton Dubois, MD   6 each at 10/15/19 1103  . DULoxetine (CYMBALTA) DR  capsule 60 mg  60 mg Oral Daily Barton Dubois, MD   60 mg at 10/15/19 1101  . folic acid (FOLVITE) tablet 2 mg  2,000 mcg Oral Daily Barton Dubois, MD   2 mg at 10/15/19 1102  . levothyroxine (SYNTHROID) tablet 112 mcg  112 mcg Oral Daily Barton Dubois, MD   112 mcg at 10/15/19 0601  . liothyronine (CYTOMEL) tablet 10 mcg  10 mcg Oral Daily Barton Dubois, MD   10 mcg at 10/15/19 1103  . MEDLINE mouth rinse  15 mL Mouth Rinse BID Barton Dubois, MD   15 mL at 10/15/19 1103  . metroNIDAZOLE (FLAGYL) IVPB 500 mg  500 mg Intravenous Q8H Tat, David, MD      . nabumetone (RELAFEN) tablet 1,000 mg  1,000 mg Oral BID PRN Barton Dubois, MD      . prochlorperazine (COMPAZINE) injection 5 mg  5 mg Intravenous Q4H PRN Barton Dubois, MD      . rosuvastatin (CRESTOR) tablet 10 mg  10 mg Oral Daily Barton Dubois, MD   10 mg at 10/15/19 1102  . thiamine (VITAMIN B-1) tablet 100 mg  100 mg Oral Daily Barton Dubois, MD   100 mg at 10/15/19 1101    Allergies as of 10/12/2019 - Review Complete 10/12/2019  Allergen Reaction Noted  . Ciprofloxacin Other (See Comments) 08/15/2016  . Procardia [nifedipine] Swelling 04/21/2011  . Norvasc [amlodipine besylate] Hives 04/21/2011    Family History  Problem Relation Age of Onset  . GER  disease Mother   . Ulcers Mother   . Hypertension Maternal Grandmother     Social History   Socioeconomic History  . Marital status: Widowed    Spouse name: Not on file  . Number of children: 2  . Years of education: 78  . Highest education level: Some college, no degree  Occupational History  . 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 Financial resource strain: Not hard at all  . Food insecurity    Worry: Never true    Inability: Never true  . Transportation needs    Medical: No    Non-medical: No  Tobacco Use  . Smoking status: Former Smoker    Packs/day: 2.00    Years: 30.00    Pack years: 60.00    Types: Cigarettes    Quit date: 06/17/1992    Years since quitting: 27.3  . Smokeless tobacco: Never Used  Substance and Sexual Activity  . Alcohol use: No  . Drug use: No  . Sexual activity: Not Currently  Lifestyle  . Physical activity    Days per week: 0 days    Minutes per session: 0 min  . Stress: Not at all  Relationships  . Social connections    Talks on phone: More than three times a week    Gets together: More than three times a week    Attends religious service: More than 4 times per year    Active member of club or organization: Yes    Attends meetings of clubs or organizations: More than 4 times per year    Relationship status: Widowed  . Intimate partner violence    Fear of current or ex partner: No    Emotionally abused: No    Physically abused: No    Forced sexual activity: No  Other Topics Concern  . Not on file  Social History Narrative  . Not on  file    Review of Systems: See HPI, otherwise normal ROS  Physical Exam: Temp:  [99 F (37.2 C)-100.5 F (38.1 C)] 99 F (37.2 C) (11/04 1447) Pulse Rate:  [95-119] 101 (11/04 1447) Resp:  [19-20] 19 (11/04 1447) BP: (115-143)/(55-76) 128/76 (11/04 1447) SpO2:  [91 %-99 %] 98 % (11/04 1447) Last BM Date: 10/14/19  Patient is alert  and in no acute distress. Conjunctiva is pale.  Sclerae nonicteric. Oropharyngeal mucosa is normal. Dentition in satisfactory condition. Cardiac exam with regular rhythm normal S1 and S2.  No murmur or gallop noted. Auscultation lungs reveal vesicular breath sounds bilaterally.  No rales or rhonchi. Abdomen is symmetrical.  Bowel sounds are normal.  On palpation is soft and nontender with organomegaly or masses. She does not have clubbing koilonychia or peripheral edema.    Lab Results: Recent Labs    10/13/19 0351 10/14/19 0417 10/15/19 0410  WBC 7.5 5.4 5.1  HGB 10.0* 9.6* 9.3*  HCT 32.2* 30.4* 29.6*  PLT 102* 120* 130*   BMET Recent Labs    10/13/19 0351 10/15/19 0410  NA 138 137  K 4.2 3.1*  CL 106 104  CO2 19* 23  GLUCOSE 112* 107*  BUN 15 7*  CREATININE 1.29* 1.14*  CALCIUM 7.7* 8.0*   LFT Recent Labs    10/13/19 0351  PROT 6.1*  ALBUMIN 2.9*  AST 17  ALT 9  ALKPHOS 60  BILITOT 0.8   PT/INR No results for input(s): LABPROT, INR in the last 72 hours. Hepatitis Panel No results for input(s): HEPBSAG, HCVAB, HEPAIGM, HEPBIGM in the last 72 hours.  Studies/Results:  Abdominal pelvic CT from 10/12/2019 reviewed.  There is focal wall thickening to terminal ileum cholelithiasis and 11 mm lesion in right hepatic lobe. He also has compression fracture to T12 and L1.  Assessment;  Patient is 73 year old Caucasian female who presented with abdominal pain increased urinary frequency and diarrhea who was found to have urosepsis secondary to E. coli responding to ceftriaxone.  Abdominal pelvic CT on 10/12/2019 revealed wall thickening to terminal ileum. Suspect acute ileitis secondary to E. coli sepsis.  Patient's diarrhea has resolved.  Patient has history of proximal colonic ulcers back in January 2017 felt to be secondary to NSAID use.  She is not using NSAIDs anymore.  I do not believe she needs further work-up unless stool is loose in which case GI pathogen  panel should be done.  Anemia secondary to acute illness.  Mild thrombocytopenia possibly due to sepsis.  Asymptomatic cholelithiasis.  Hepatic lesion in right lobe measuring 11 mm felt to be hemangioma.  This lesion was not seen on CT of January 2017 which was without contrast.   Recommendations;  GI pathogen panel if stool is loose. I would recommend follow-up with Dr. Gala Romney in 4 to 6 weeks. Liver protocol CT in 6 months or so to document stability of liver lesion and hopefully prove unequivocally that it is hemangioma  Discussed with Dr. Shanon Brow Tat.   LOS: 3 days   Hiawatha Merriott  10/15/2019, 5:26 PM

## 2019-10-15 NOTE — Progress Notes (Signed)
PROGRESS NOTE  [name] [id number] DOB: [birth year] DOA: 10/12/2019 PCP: Claretta Fraise, MD  Brief History:  73 y.o.femalewith medical history significant ofalcoholism, anemia, anxiety/depression, osteoarthritis of left knee, B12 deficiency, right carotid artery narrowing, stage II chronic kidney disease, COPD, headaches, hyperlipidemia, hypertension, hyponatremia, hypothyroidism, history of TIA, history of orthostatic syncope, history of pneumonia, history of SBO who is coming to the emergency department with complaints of 5 days of progressively worse urinary urgency, lower abdominal pain and dysuria.  She has had multiple episodes of emesis and diarrhea for 3 days. She denies melena or hematochezia. She did try over-the-counter Azo with some improvement, but did not take away completely the symptoms. In the ED, the patient was noted to have a temperature of 100.4 F with tachycardia in the 130s.  CT of the abdomen and pelvis showed multiple areas of hypoenhancement in the kidney consistent with bilateral pyelonephritis.  There was also mild wall thickening of the terminal ileum and right colon without adjacent inflammatory changes.  The patient was started on ceftriaxone.  UA showed 21-50 WBC.  Blood cultures subsequently grew E. coli.  Ceftriaxone was increased to 2 g daily.  GI was consulted for abnormal CT findings.  Assessment/Plan: Sepsis -Present on admission -Secondary to pyelonephritis and bacteremia -Lactic acid peaked 2.5 -Continue ceftriaxone -Fevers trending down, still had low-grade temperature a.m. 10/15/2019 -Continue IV fluids  Pyelonephritis -Blood culture showing E. Coli -Continue ceftriaxone -CT abd/pelvis--hypoenhancement of the kidneys  Ascending colon and terminal ileum thickening -GI consult -concerned about colitis/ileitis -12/17/2015 colonoscopy--Marked ascending colon ileocecal valve ulceration -add metronidazole  Acute on chronic renal  failure--CKD stage II -Baseline creatinine 0.8-1.0 -Serum creatinine peaked 1.34 -Secondary to sepsis  Thrombocytopenia -Secondary to sepsis -Improving with antibiotics -Monitor for signs of bleeding  Essential hypertension -Holding olmesartan/HCTZ  Lactic acidosis -Secondary to sepsis -Improved  Hypothyroidism -Continue Synthroid and Cytomel  Hyperlipidemia -Continue crestor  Depression -Continue Cymbalta  Hypokalemia -replete -check mag -add KCl to IVF     Disposition Plan:   Home 11/5 if cleared by GI Family Communication:   No Family at bedside  Consultants:  GI  Code Status:  FULL  DVT Prophylaxis:  [place] Heparin /  Lovenox   Procedures: As Listed in Progress Note Above  Antibiotics: Ceftriaxone 2 grams 11/3>>> Metronidazole 11/4>>>       Subjective: Patient denies fevers, chills, headache, chest pain, dyspnea, nausea, vomiting, diarrhea, abdominal pain, dysuria, hematuria, hematochezia, and melena.   Objective: Vitals:   10/14/19 2100 10/14/19 2200 10/15/19 0006 10/15/19 0452  BP: 122/72 (!) 115/55 119/73 119/74  Pulse: 100 95 97 (!) 101  Resp:   20 20  Temp:   100.2 F (37.9 C) (!) 100.5 F (38.1 C)  TempSrc:   Oral Oral  SpO2: 94% 93% 99% 99%  Weight:      Height:        Intake/Output Summary (Last 24 hours) at 10/15/2019 1004 Last data filed at 10/15/2019 0500 Gross per 24 hour  Intake 193.96 ml  Output --  Net 193.96 ml   Weight change:  Exam:   General:  Pt is alert, follows commands appropriately, not in acute distress  HEENT: No icterus, No thrush, No neck mass, [place]/AT  Cardiovascular: RRR, S1/S2, no rubs, no gallops  Respiratory: CTA bilaterally, no wheezing, no crackles, no rhonchi  Abdomen: Soft/+BS, non tender, non distended, no guarding  Extremities: trace nonpitting edema, No lymphangitis, No petechiae, No  rashes, no synovitis   Data Reviewed: I have personally reviewed following labs and imaging  studies Basic Metabolic Panel: Recent Labs  Lab 10/12/19 1449 10/12/19 1458 10/13/19 0351 10/15/19 0410  NA 128*  --  138 137  K 3.0*  --  4.2 3.1*  CL 93*  --  106 104  CO2 22  --  19* 23  GLUCOSE 116*  --  112* 107*  BUN 17  --  15 7*  CREATININE 1.34*  --  1.29* 1.14*  CALCIUM 8.1*  --  7.7* 8.0*  MG  --  1.8  --   --   PHOS  --  2.0*  --   --    Liver Function Tests: Recent Labs  Lab 10/12/19 1449 10/13/19 0351  AST 20 17  ALT 11 9  ALKPHOS 73 60  BILITOT 1.1 0.8  PROT 7.2 6.1*  ALBUMIN 3.6 2.9*   Recent Labs  Lab 10/12/19 1449  LIPASE 17   No results for input(s): AMMONIA in the last 168 hours. Coagulation Profile: No results for input(s): INR, PROTIME in the last 168 hours. CBC: Recent Labs  Lab 10/12/19 1449 10/13/19 0351 10/14/19 0417 10/15/19 0410  WBC 10.0 7.5 5.4 5.1  NEUTROABS 8.2* 5.6 3.4  --   HGB 11.1* 10.0* 9.6* 9.3*  HCT 34.1* 32.2* 30.4* 29.6*  MCV 94.5 97.0 96.5 95.5  PLT PLATELET CLUMPS NOTED ON SMEAR, COUNT APPEARS ADEQUATE 102* 120* 130*   Cardiac Enzymes: No results for input(s): CKTOTAL, CKMB, CKMBINDEX, TROPONINI in the last 168 hours. BNP: Invalid input(s): POCBNP CBG: No results for input(s): GLUCAP in the last 168 hours. HbA1C: No results for input(s): HGBA1C in the last 72 hours. Urine analysis:    Component Value Date/Time   COLORURINE AMBER (A) 10/12/2019 1730   APPEARANCEUR HAZY (A) 10/12/2019 1730   APPEARANCEUR Clear 12/27/2018 0928   LABSPEC 1.011 10/12/2019 1730   PHURINE 5.0 10/12/2019 1730   GLUCOSEU NEGATIVE 10/12/2019 1730   HGBUR MODERATE (A) 10/12/2019 1730   BILIRUBINUR NEGATIVE 10/12/2019 1730   BILIRUBINUR Negative 12/27/2018 0928   KETONESUR NEGATIVE 10/12/2019 1730   PROTEINUR 100 (A) 10/12/2019 1730   UROBILINOGEN negative 09/10/2015 1624   UROBILINOGEN 0.2 04/30/2015 0346   NITRITE POSITIVE (A) 10/12/2019 1730   LEUKOCYTESUR SMALL (A) 10/12/2019 1730   Sepsis  Labs: @LABRCNTIP (procalcitonin:4,lacticidven:4) ) Recent Results (from the past 240 hour(s))  Blood culture (routine x 2)     Status: Abnormal   Collection Time: 10/12/19  4:52 PM   Specimen: BLOOD LEFT ARM  Result Value Ref Range Status   Specimen Description   Final    BLOOD LEFT ARM Performed at [facility], [address], [place], [place] [address]    Special Requests   Final    BOTTLES DRAWN AEROBIC AND ANAEROBIC Blood Culture adequate volume Performed at [facility], [address] [place], [place] [address]    Culture  Setup Time   Final    GRAM NEGATIVE RODS Gram Stain Report Called to,Read Back By and Verified With: HYLTON,L ON 10/13/19 AT 1800 BY LOY,C AEROBIC BOTTLE ONLY PERFORMED AT [facility] Organism ID to follow CRITICAL RESULT CALLED TO, READ BACK BY AND VERIFIED WITHMiquel Dunn RN [id number] 10/13/19 A BROWNING Performed at [facility], [place] [address] [place], [place] [address]    Culture ESCHERICHIA COLI (A)  Final   Report Status 10/15/2019 FINAL  Final   Organism ID, Bacteria ESCHERICHIA COLI  Final  Susceptibility   Escherichia coli - MIC*    AMPICILLIN 4 SENSITIVE Sensitive     CEFAZOLIN <=4 SENSITIVE Sensitive     CEFEPIME <=1 SENSITIVE Sensitive     CEFTAZIDIME <=1 SENSITIVE Sensitive     CEFTRIAXONE <=1 SENSITIVE Sensitive     CIPROFLOXACIN <=0.25 SENSITIVE Sensitive     GENTAMICIN <=1 SENSITIVE Sensitive     IMIPENEM <=0.25 SENSITIVE Sensitive     TRIMETH/SULFA <=20 SENSITIVE Sensitive     AMPICILLIN/SULBACTAM <=2 SENSITIVE Sensitive     PIP/TAZO <=4 SENSITIVE Sensitive     Extended ESBL NEGATIVE Sensitive     * ESCHERICHIA COLI  Blood Culture ID Panel (Reflexed)     Status: Abnormal   Collection Time: 10/12/19  4:52 PM  Result Value Ref Range Status   Enterococcus species NOT DETECTED NOT DETECTED Final   Listeria monocytogenes NOT DETECTED NOT DETECTED Final   Staphylococcus species NOT DETECTED NOT DETECTED Final   Staphylococcus aureus  (BCID) NOT DETECTED NOT DETECTED Final   Streptococcus species NOT DETECTED NOT DETECTED Final   Streptococcus agalactiae NOT DETECTED NOT DETECTED Final   Streptococcus pneumoniae NOT DETECTED NOT DETECTED Final   Streptococcus pyogenes NOT DETECTED NOT DETECTED Final   Acinetobacter baumannii NOT DETECTED NOT DETECTED Final   Enterobacteriaceae species DETECTED (A) NOT DETECTED Final    Comment: Enterobacteriaceae represent a large family of gram-negative bacteria, not a single organism. CRITICAL RESULT CALLED TO, READ BACK BY AND VERIFIED WITH: Miquel Dunn RN [id number] 10/13/19 A BROWNING    Enterobacter cloacae complex NOT DETECTED NOT DETECTED Final   Escherichia coli DETECTED (A) NOT DETECTED Final    Comment: CRITICAL RESULT CALLED TO, READ BACK BY AND VERIFIED WITH: Miquel Dunn RN [id number] 10/13/19 A BROWNING    Klebsiella oxytoca NOT DETECTED NOT DETECTED Final   Klebsiella pneumoniae NOT DETECTED NOT DETECTED Final   Proteus species NOT DETECTED NOT DETECTED Final   Serratia marcescens NOT DETECTED NOT DETECTED Final   Carbapenem resistance NOT DETECTED NOT DETECTED Final   Haemophilus influenzae NOT DETECTED NOT DETECTED Final   Neisseria meningitidis NOT DETECTED NOT DETECTED Final   Pseudomonas aeruginosa NOT DETECTED NOT DETECTED Final   Candida albicans NOT DETECTED NOT DETECTED Final   Candida glabrata NOT DETECTED NOT DETECTED Final   Candida krusei NOT DETECTED NOT DETECTED Final   Candida parapsilosis NOT DETECTED NOT DETECTED Final   Candida tropicalis NOT DETECTED NOT DETECTED Final    Comment: Performed at [facility], [place] [address], [place], [place] [address]  Blood culture (routine x 2)     Status: None (Preliminary result)   Collection Time: 10/12/19  5:08 PM   Specimen: Left Antecubital; Blood  Result Value Ref Range Status   Specimen Description LEFT ANTECUBITAL  Final   Special Requests   Final    BOTTLES DRAWN AEROBIC AND ANAEROBIC Blood Culture results may not  be optimal due to an excessive volume of blood received in culture bottles   Culture   Final    NO GROWTH 2 DAYS Performed at [facility], [address] [place], [place] [address]    Report Status PENDING  Incomplete  SARS CORONAVIRUS 2 (Mykael Batz 6-24 HRS) Nasopharyngeal Nasopharyngeal Swab     Status: None   Collection Time: 10/12/19  7:34 PM   Specimen: Nasopharyngeal Swab  Result Value Ref Range Status   SARS Coronavirus 2 NEGATIVE NEGATIVE Final    Comment: (NOTE) SARS-CoV-2 target nucleic acids are NOT DETECTED. The  SARS-CoV-2 RNA is generally detectable in upper and lower respiratory specimens during the acute phase of infection. Negative results do not preclude SARS-CoV-2 infection, do not rule out co-infections with other pathogens, and should not be used as the sole basis for treatment or other patient management decisions. Negative results must be combined with clinical observations, patient history, and epidemiological information. The expected result is Negative. Fact Sheet for Patients: SugarRoll.be Fact Sheet for Healthcare Providers: https://www.woods-mathews.com/ This test is not yet approved or cleared by the [place] FDA and  has been authorized for detection and/or diagnosis of SARS-CoV-2 by FDA under an Emergency Use Authorization (EUA). This EUA will remain  in effect (meaning this test can be used) for the duration of the COVID-19 declaration under Section 56 4(b)(1) of the Act, 21 U.S.C. section 360bbb-3(b)(1), unless the authorization is terminated or revoked sooner. Performed at [facility], [place] [address], [place], [place] [address]   MRSA PCR Screening     Status: None   Collection Time: 10/12/19 11:57 PM   Specimen: Nasal Mucosa; Nasopharyngeal  Result Value Ref Range Status   MRSA by PCR NEGATIVE NEGATIVE Final    Comment:        The GeneXpert MRSA Assay (FDA approved for NASAL specimens only),  is one component of a comprehensive MRSA colonization surveillance program. It is not intended to diagnose MRSA infection nor to guide or monitor treatment for MRSA infections. Performed at [facility], [address], [place], [place] [address]      Scheduled Meds:  aspirin EC  81 mg Oral Daily   Chlorhexidine Gluconate Cloth  6 each Topical Daily   DULoxetine  60 mg Oral Daily   folic acid  123XX123 mcg Oral Daily   levothyroxine  112 mcg Oral Daily   liothyronine  10 mcg Oral Daily   mouth rinse  15 mL Mouth Rinse BID   rosuvastatin  10 mg Oral Daily   thiamine  100 mg Oral Daily   Continuous Infusions:  sodium chloride 75 mL/hr at 10/14/19 2233   cefTRIAXone (ROCEPHIN)  IV 200 mL/hr at 10/14/19 1532    Procedures/Studies: Ct Abdomen Pelvis W Contrast  Result Date: 10/12/2019 CLINICAL DATA:  Abdominal pain. Urinary and bowel incontinence. Emesis. EXAM: CT ABDOMEN AND PELVIS WITH CONTRAST TECHNIQUE: Multidetector CT imaging of the abdomen and pelvis was performed using the standard protocol following bolus administration of intravenous contrast. CONTRAST:  41mL OMNIPAQUE IOHEXOL 300 MG/ML  SOLN COMPARISON:  06/28/2016 FINDINGS: Lower chest: Choose 1 Hepatobiliary: Liver normal in size and overall attenuation. 11 mm low-attenuation lesion in right lobe, segment 7, not well-defined on the prior CT that was performed without contrast. This is likely a hemangioma. No other liver masses or lesions. Small gallstones suggested dependently a mildly distended gallbladder. No wall thickening or inflammation. No bile duct dilation. Pancreas: Unremarkable. No pancreatic ductal dilatation or surrounding inflammatory changes. Spleen: Prominent spleen, 12 cm in long axis increased from 10 cm on the prior CT. No splenic mass or focal lesion. Adrenals/Urinary Tract: No adrenal masses. Bilateral renal cortical thinning. There are multiple areas of relative decreased renal enhancement  bilaterally, most evident on the delayed sequence. No defined renal masses, no stones and no hydronephrosis. Mild stranding in the perinephric spaces, without a perinephric fluid collection. Ureters are normal in course and in caliber. Bladder is unremarkable. Stomach/Bowel: Unremarkable stomach. Colon and small bowel are normal in caliber. Mild wall thickening of the terminal ileum. No other small  bowel wall thickening. There is mild wall prominence at the ileocecal valve, in the right colon. Remaining portions of the colon shows normal wall thickness. No mesenteric inflammation. Vascular/Lymphatic: Aortic atherosclerosis. No aneurysm. There is plaque at the origin of the renal arteries, but no definite hemodynamically significant stenosis. No enlarged lymph nodes. Reproductive: Uterus and bilateral adnexa are unremarkable. Other: No abdominal wall hernia or abnormality. No abdominopelvic ascites. Musculoskeletal: Compression fractures of T12 and L1. Slight depression of the upper endplate of L2 and lower endplate of L3. Small node along the upper endplate of L4. The compression fracture of L1 has increased in severity from the prior CT. The compression fracture of T12 is new. These do not appear acute or recent, however. No other fractures. No osteoblastic or osteolytic bone lesions. IMPRESSION: 1. Multiple areas of decreased enhancement in the kidneys, best seen on the delayed sequence. This is consistent with bilateral pyelonephritis in the proper clinical setting. No evidence of a renal or perinephric abscess. No hydronephrosis. 2. Mild wall thickening of the terminal ileum and right colon at the ileocecal valve, without adjacent inflammation. Consider infectious or inflammatory ileitis. 3. No other evidence of an acute abnormality. 4. Aortic atherosclerosis. 5. Liver lesion that is most likely a hemangioma. 6. Spleen appears borderline enlarged and larger than it was previously. Etiology of this is unclear.  Electronically Signed   By: Lajean Manes M.D.   On: 10/12/2019 19:28   Dg Chest Portable 1 View  Result Date: 10/12/2019 CLINICAL DATA:  Shortness of breath EXAM: PORTABLE CHEST 1 VIEW COMPARISON:  Portable exam 2110 hours compared to 1554 hours FINDINGS: Normal heart size, mediastinal contours, and pulmonary vascularity. Atherosclerotic calcification aorta. Biapical scarring. Lungs otherwise clear. No acute infiltrate, pleural effusion or pneumothorax. Bones unremarkable. IMPRESSION: No acute abnormalities. Electronically Signed   By: Lavonia Dana M.D.   On: 10/12/2019 21:32   Dg Chest Portable 1 View  Result Date: 10/12/2019 CLINICAL DATA:  Fever, abdominal pain. EXAM: PORTABLE CHEST 1 VIEW COMPARISON:  Chest x-rays dated 05/18/2017 and 10/18/2016. FINDINGS: Heart size and mediastinal contours are within normal limits. Biapical pleuroparenchymal scarring/fibrosis. Lungs otherwise clear. No confluent opacity to suggest a developing pneumonia. No pleural effusion. Osseous structures about the chest are unremarkable. IMPRESSION: No active cardiopulmonary disease. No evidence of pneumonia or pulmonary edema. Electronically Signed   By: Franki Cabot M.D.   On: 10/12/2019 16:14    Orson Eva, DO  [facility] Pager [phone]  If 7PM-7AM, please contact night-coverage [email or web address] Password [email or web address] 10/15/2019, 10:04 AM   LOS: 3 days

## 2019-10-15 NOTE — Evaluation (Signed)
Physical Therapy Evaluation Patient Details Name: [name] MRN: [id number] DOB: [birth year] Today's Date: 10/15/2019   History of Present Illness  [name] is a 73 y.o. female with medical history significant of alcoholism, anemia, anxiety/depression, osteoarthritis of left knee, B12 deficiency, right carotid artery narrowing, stage II chronic kidney disease, COPD, headaches, hyperlipidemia, hypertension, hyponatremia, hypothyroidism, history of TIA, history of orthostatic syncope, history of pneumonia, history of SBO who is coming to the emergency department with complaints of 5 days of progressively worse urinary urgency, lower abdominal pain and dysuria.  She denies flank pain or hematuria.  She has had multiple episodes of emesis and diarrhea for the past 3 days.  She denies melena or hematochezia.  She did try over-the-counter Azo with some improvement, but did not take away completely the symptoms.  She denies fevers, but complains of chills, night sweats, decreased appetite, fatigue and malaise.  No headache, rhinorrhea, sore throat, wheezing or hemoptysis.  Denies chest pain, dyspnea, palpitations, PND, orthopnea or recent pitting edema of the lower extremities.  No polyuria, polydipsia, polyphagia or blurred vision.    Clinical Impression  Patient tolerated treatment well. Patient was able to perform all bed mobility and transfers Mod I due to slightly labored movement/ increased time. Patient showed good seated balance and tolerance at EOB. Patient showed good standing balance using SPC in LT hand. Patient was able to ambulate 100 feet in hallway to stairs, ambulate 3 steps and return to room with Mod I. Patient did note increased discomfort in LLE toward end of ambulation. Patient returned to chair, noting minimal fatigue. Patient left upright in bed with phone and call bell in reach. Nursing staff notified of current mobility status. Patient dischargedphysical therapy to care of  nursing for ambulation daily as tolerated for length of stay.     Follow Up Recommendations Supervision - Intermittent;Supervision for mobility/OOB    Equipment Recommendations  None recommended by PT    Recommendations for Other Services       Precautions / Restrictions Precautions Precautions: Fall Restrictions Weight Bearing Restrictions: No      Mobility  Bed Mobility Overal bed mobility: Modified Independent             General bed mobility comments: Slightly labored movement  Transfers Overall transfer level: Modified independent Equipment used: Straight cane             General transfer comment: Slightly labored movement  Ambulation/Gait Ambulation/Gait assistance: Modified independent (Device/Increase time) Gait Distance (Feet): 100 Feet Assistive device: Straight cane Gait Pattern/deviations: Decreased stride length;Decreased stance time - left Gait velocity: Slightly decreased   General Gait Details: Slight decrease in LLE stance due to pain, uses SPC in LT hand. Appears steady and with good awareness otherwise  Stairs Stairs: Yes Stairs assistance: Modified independent (Device/Increase time) Stair Management: One rail Right;Step to pattern;With cane Number of Stairs: 3 General stair comments: Step to pattern, Minimal use of HHA using rail  Wheelchair Mobility    Modified Rankin (Stroke Patients Only)       Balance Overall balance assessment: Modified Independent                                           Pertinent Vitals/Pain Pain Assessment: Faces Faces Pain Scale: Hurts little more Pain Location: Low back and LT knee Pain Descriptors / Indicators: Aching;Sore Pain Intervention(s): Limited activity  within patient's tolerance;Monitored during session    [place] expects to be discharged to:: Private residence Living Arrangements: Children Available Help at Discharge: Family;Available  PRN/intermittently Type of Home: House Home Access: Stairs to enter Entrance Stairs-Rails: None Entrance Stairs-Number of Steps: 3 Home Layout: One level Home Equipment: Walker - 2 wheels;Walker - 4 wheels;Cane - single point;Shower seat      Prior Function Level of Independence: Independent with assistive device(s)         Comments: 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, newspaper        Extremity/Trunk Assessment   Upper Extremity Assessment Upper Extremity Assessment: Overall WFL for tasks assessed    Lower Extremity Assessment Lower Extremity Assessment: Overall WFL for tasks assessed    Cervical / Trunk Assessment Cervical / Trunk Assessment: Normal  Communication   Communication: No difficulties  Cognition Arousal/Alertness: Awake/alert Behavior During Therapy: WFL for tasks assessed/performed Overall Cognitive Status: Within Functional Limits for tasks assessed                                        General Comments      Exercises     Assessment/Plan    PT Assessment Patent does not need any further PT services  PT Problem List         PT Treatment Interventions      PT Goals (Current goals can be found in the Care Plan section)  Acute Rehab PT Goals Patient Stated Goal: Return home PT Goal Formulation: With patient Time For Goal Achievement: 10/15/19 Potential to Achieve Goals: Good    Frequency     Barriers to discharge        Co-evaluation               AM-PAC PT "6 Clicks" Mobility  Outcome Measure Help needed turning from your back to your side while in a flat bed without using bedrails?: None Help needed moving from lying on your back to sitting on the side of a flat bed without using bedrails?: None Help needed moving to and from a bed to a chair (including a wheelchair)?: None Help needed standing up from a chair using your arms (e.g., wheelchair or bedside chair)?:  None Help needed to walk in hospital room?: A Little Help needed climbing 3-5 steps with a railing? : A Little 6 Click Score: 22    End of Session   Activity Tolerance: Patient tolerated treatment well Patient left: in chair;with call bell/phone within reach Nurse Communication: Mobility status PT Visit Diagnosis: Unsteadiness on feet (R26.81);Other abnormalities of gait and mobility (R26.89);Difficulty in walking, not elsewhere classified (R26.2)    Time: [id number] PT Time Calculation (min) (ACUTE ONLY): 20 min   Charges:   PT Evaluation $PT Eval Low Complexity: 1 Low         8:46 AM, 10/15/19 Josue Hector PT DPT  Physical Therapist with [facility]  [phone]

## 2019-10-16 ENCOUNTER — Telehealth: Payer: Self-pay | Admitting: Gastroenterology

## 2019-10-16 ENCOUNTER — Encounter: Payer: Self-pay | Admitting: Internal Medicine

## 2019-10-16 LAB — BASIC METABOLIC PANEL
Anion gap: 11 (ref 5–15)
BUN: 6 mg/dL — ABNORMAL LOW (ref 8–23)
CO2: 19 mmol/L — ABNORMAL LOW (ref 22–32)
Calcium: 7.8 mg/dL — ABNORMAL LOW (ref 8.9–10.3)
Chloride: 107 mmol/L (ref 98–111)
Creatinine, Ser: 1.06 mg/dL — ABNORMAL HIGH (ref 0.44–1.00)
GFR calc Af Amer: >60 mL/min (ref >60)
GFR calc non Af Amer: 52 mL/min — ABNORMAL LOW (ref >60)
Glucose, Bld: 111 mg/dL — ABNORMAL HIGH (ref 70–99)
Potassium: 3.2 mmol/L — ABNORMAL LOW (ref 3.5–5.1)
Sodium: 137 mmol/L (ref 135–145)

## 2019-10-16 LAB — CBC
HCT: 30.1 % — ABNORMAL LOW (ref 36.0–46.0)
Hemoglobin: 9.7 g/dL — ABNORMAL LOW (ref 12.0–15.0)
MCH: 30.3 pg (ref 26.0–34.0)
MCHC: 32.2 g/dL (ref 30.0–36.0)
MCV: 94.1 fL (ref 80.0–100.0)
Platelets: 168 10*3/uL (ref 150–400)
RBC: 3.2 MIL/uL — ABNORMAL LOW (ref 3.87–5.11)
RDW: 13.5 % (ref 11.5–15.5)
WBC: 6 10*3/uL (ref 4.0–10.5)
nRBC: 0.3 % — ABNORMAL HIGH (ref 0.0–0.2)

## 2019-10-16 LAB — MAGNESIUM: Magnesium: 1.9 mg/dL (ref 1.7–2.4)

## 2019-10-16 MED ORDER — GUAIFENESIN-DM 100-10 MG/5ML PO SYRP
5.0000 mL | ORAL_SOLUTION | ORAL | Status: DC | PRN
  Administered 2019-10-16: 5 mL via ORAL
  Filled 2019-10-16: qty 5

## 2019-10-16 MED ORDER — LEVOFLOXACIN 500 MG PO TABS
500.0000 mg | ORAL_TABLET | Freq: Every day | ORAL | Status: DC
  Administered 2019-10-16: 500 mg via ORAL
  Filled 2019-10-16: qty 1

## 2019-10-16 MED ORDER — POTASSIUM CHLORIDE CRYS ER 20 MEQ PO TBCR
40.0000 meq | EXTENDED_RELEASE_TABLET | Freq: Once | ORAL | Status: AC
  Administered 2019-10-16: 09:00:00 40 meq via ORAL
  Filled 2019-10-16: qty 2

## 2019-10-16 MED ORDER — LEVOFLOXACIN 500 MG PO TABS: 500.0000 mg | ORAL_TABLET | Freq: Every day | ORAL | 0 refills | Status: DC

## 2019-10-16 NOTE — Telephone Encounter (Signed)
PATIENT SCHEDULED AND LETTER SENT  °

## 2019-10-16 NOTE — Telephone Encounter (Signed)
Patient needs hospital follow-up in 4 to 6 weeks with Dr. Gala Romney or any APP.

## 2019-10-16 NOTE — Progress Notes (Signed)
Enteric precautions in place this am, Gastric panel PCR ordered for loose stool per nursing report since patient had been reporting loose stools on admission. No stool during the night per report. Patient reported one soft, formed stool this am. Unable to send for gastric panel PCR since stool was not collected and was formed. Donavan Foil, RN

## 2019-10-16 NOTE — Progress Notes (Signed)
Discharge instructions reviewed with patient. Given AVS. Verbalized understanding of instructions, follow-up and prescription for Levaquin to be picked up from her pharmacy. First dose of levaquin given as ordered prior to discharge. Patient reported "I had bad reaction with hallucinations when I took that sister drug Cipro." stated she discussed with MD and she was willing to try Levaquin and her son would be with her if any issues. Tolerated first dose with no complaints. [place] for discharge per MD. IV site removed, site was within normal limits. Pt left floor in stable condition via w/c accompanied by nursing staff. Discharged home. Donavan Foil, RN

## 2019-10-17 ENCOUNTER — Telehealth: Payer: Self-pay | Admitting: Family Medicine

## 2019-10-17 LAB — CULTURE, BLOOD (ROUTINE X 2): Culture: NO GROWTH

## 2019-10-17 NOTE — Discharge Summary (Addendum)
Physician Discharge Summary  JENSYN ROVITO [id number] DOB: [birth year] DOA: 10/12/2019  PCP: Claretta Fraise, MD  Admit date: 10/12/2019 Discharge date: 10/16/2019  Admitted From: Home Disposition:  Home  Recommendations for Outpatient Follow-up:  1. Follow up with PCP in 1-2 weeks 2. Please obtain BMP/CBC in one week     Discharge Condition: Stable CODE STATUS: FULL Diet recommendation: Heart Healthy   Brief/Interim Summary: 73 y.o.femalewith medical history significant ofalcoholism, anemia, anxiety/depression, osteoarthritis of left knee, B12 deficiency, right carotid artery narrowing, stage II chronic kidney disease, COPD, headaches, hyperlipidemia, hypertension, hyponatremia, hypothyroidism, history of TIA, history of orthostatic syncope, history of pneumonia, history of SBO who is coming to the emergency department with complaints of 5 days of progressively worse urinary urgency, lower abdominal pain and dysuria.  She has had multiple episodes of emesis and diarrhea for 3 days. She denies melena or hematochezia. She did try over-the-counter Azo with some improvement, but did not take away completely the symptoms. In the ED, the patient was noted to have a temperature of 100.4 F with tachycardia in the 130s.  CT of the abdomen and pelvis showed multiple areas of hypoenhancement in the kidney consistent with bilateral pyelonephritis.  There was also mild wall thickening of the terminal ileum and right colon without adjacent inflammatory changes.  The patient was started on ceftriaxone.  UA showed 21-50 WBC.  Blood cultures subsequently grew E. coli.  Ceftriaxone was increased to 2 g daily.  GI was consulted for abnormal CT findings.  They this may be ileitis related to patient's EColi bacteremia and did not recommend further workup unless pt started having diarrhea, which she did not.   Discharge Diagnoses:  Sepsis -Present on admission -Secondary to pyelonephritis and  bacteremia -Lactic acid peaked 2.5 -Continue ceftriaxone>>>d/c home with levofloxacin x 10 more days to finish 2 weeks of abx -Fevers trending down, remains hemodynamically improved and clinically improved -Continue IV fluids  Pyelonephritis -Blood culture showing E. Coli -Continue ceftriaxone>>>home with levofloxacin -CT abd/pelvis--hypoenhancement of the kidneys  Ascending colon and terminal ileum thickening -GI consult appreciated--may be ileitis related to patient's EColi bacteremia and did not recommend further workup unless pt started having diarrhea, which she did not. -concerned about colitis/ileitis -12/17/2015 colonoscopy--Marked ascending colon ileocecal valve ulceration -add metronidazole during hospitalization  Acute on chronic renal failure--CKD stage II -Baseline creatinine 0.8-1.0 -Serum creatinine peaked 1.34 -Secondary to sepsis -serum creatinine 1.06 on day of d/c  Thrombocytopenia -Secondary to sepsis -Improving with antibiotics -Monitor for signs of bleeding  Essential hypertension -Holding olmesartan/HCTZ--restart after d/c  Lactic acidosis -Secondary to sepsis -Improved  Hypothyroidism -Continue Synthroid and Cytomel  Hyperlipidemia -Continue crestor  Depression -Continue Cymbalta  Hypokalemia -repleted -check mag--1.9 -add KCl to IVF   Discharge Instructions   Allergies as of 10/16/2019      Reactions   Ciprofloxacin Other (See Comments)   Caused pt to have delusional thoughts and hallucinations   Procardia [nifedipine] Swelling   Edema    Norvasc [amlodipine Besylate] Hives      Medication List    STOP taking these medications   acetaminophen 500 MG tablet Commonly known as: TYLENOL     TAKE these medications   alendronate 70 MG tablet Commonly known as: FOSAMAX Take 70 mg by mouth once a week. Take with a full glass of water on an empty stomach.   aspirin EC 81 MG tablet Take 81 mg by mouth daily.   Co Q-10  100 MG Caps Take 100 mg by  mouth daily.   DULoxetine 60 MG capsule Commonly known as: Cymbalta Take 1 capsule (60 mg total) by mouth daily.   folic acid [id number] MCG tablet Commonly known as: FOLVITE Take 2,000 mcg by mouth daily.   levofloxacin 500 MG tablet Commonly known as: LEVAQUIN Take 1 tablet (500 mg total) by mouth daily.   levothyroxine 112 MCG tablet Commonly known as: SYNTHROID Take 1 tablet (112 mcg total) by mouth daily.   liothyronine 5 MCG tablet Commonly known as: Cytomel Take 2 tablets (10 mcg total) by mouth daily.   nabumetone 500 MG tablet Commonly known as: RELAFEN TAKE 2 TABLETS (1,000 MG TOTAL) BY MOUTH 2 (TWO) TIMES DAILY FOR MUSCLE AND JOINT PAIN   olmesartan-hydrochlorothiazide 40-25 MG tablet Commonly known as: Benicar HCT Take 1 tablet by mouth daily.   ondansetron 4 MG tablet Commonly known as: ZOFRAN Take 1 tablet (4 mg total) by mouth every 6 (six) hours as needed for nausea.   rosuvastatin 10 MG tablet Commonly known as: CRESTOR TAKE 1 TABLET EVERY DAY   thiamine 100 MG tablet Commonly known as: VITAMIN B-1 Take 1 tablet (100 mg total) by mouth daily.      Follow-up Information    Stacks, Cletus Gash, MD. Schedule an appointment as soon as possible for a visit in 1 week(s).   Specialty: Family Medicine Why: Call for follow-up appointment within 1 week as instructed.  Contact information: [place] [address] [phone]          Allergies  Allergen Reactions   Ciprofloxacin Other (See Comments)    Caused pt to have delusional thoughts and hallucinations   Procardia [Nifedipine] Swelling    Edema     Norvasc [Amlodipine Besylate] Hives    Consultations:  GI   Procedures/Studies: Ct Abdomen Pelvis W Contrast  Result Date: 10/12/2019 CLINICAL DATA:  Abdominal pain. Urinary and bowel incontinence. Emesis. EXAM: CT ABDOMEN AND PELVIS WITH CONTRAST TECHNIQUE: Multidetector CT imaging of the abdomen and  pelvis was performed using the standard protocol following bolus administration of intravenous contrast. CONTRAST:  35mL OMNIPAQUE IOHEXOL 300 MG/ML  SOLN COMPARISON:  06/28/2016 FINDINGS: Lower chest: Choose 1 Hepatobiliary: Liver normal in size and overall attenuation. 11 mm low-attenuation lesion in right lobe, segment 7, not well-defined on the prior CT that was performed without contrast. This is likely a hemangioma. No other liver masses or lesions. Small gallstones suggested dependently a mildly distended gallbladder. No wall thickening or inflammation. No bile duct dilation. Pancreas: Unremarkable. No pancreatic ductal dilatation or surrounding inflammatory changes. Spleen: Prominent spleen, 12 cm in long axis increased from 10 cm on the prior CT. No splenic mass or focal lesion. Adrenals/Urinary Tract: No adrenal masses. Bilateral renal cortical thinning. There are multiple areas of relative decreased renal enhancement bilaterally, most evident on the delayed sequence. No defined renal masses, no stones and no hydronephrosis. Mild stranding in the perinephric spaces, without a perinephric fluid collection. Ureters are normal in course and in caliber. Bladder is unremarkable. Stomach/Bowel: Unremarkable stomach. Colon and small bowel are normal in caliber. Mild wall thickening of the terminal ileum. No other small bowel wall thickening. There is mild wall prominence at the ileocecal valve, in the right colon. Remaining portions of the colon shows normal wall thickness. No mesenteric inflammation. Vascular/Lymphatic: Aortic atherosclerosis. No aneurysm. There is plaque at the origin of the renal arteries, but no definite hemodynamically significant stenosis. No enlarged lymph nodes. Reproductive: Uterus and bilateral adnexa are unremarkable. Other: No  abdominal wall hernia or abnormality. No abdominopelvic ascites. Musculoskeletal: Compression fractures of T12 and L1. Slight depression of the upper endplate  of L2 and lower endplate of L3. Small node along the upper endplate of L4. The compression fracture of L1 has increased in severity from the prior CT. The compression fracture of T12 is new. These do not appear acute or recent, however. No other fractures. No osteoblastic or osteolytic bone lesions. IMPRESSION: 1. Multiple areas of decreased enhancement in the kidneys, best seen on the delayed sequence. This is consistent with bilateral pyelonephritis in the proper clinical setting. No evidence of a renal or perinephric abscess. No hydronephrosis. 2. Mild wall thickening of the terminal ileum and right colon at the ileocecal valve, without adjacent inflammation. Consider infectious or inflammatory ileitis. 3. No other evidence of an acute abnormality. 4. Aortic atherosclerosis. 5. Liver lesion that is most likely a hemangioma. 6. Spleen appears borderline enlarged and larger than it was previously. Etiology of this is unclear. Electronically Signed   By: Lajean Manes M.D.   On: 10/12/2019 19:28   Dg Chest Portable 1 View  Result Date: 10/12/2019 CLINICAL DATA:  Shortness of breath EXAM: PORTABLE CHEST 1 VIEW COMPARISON:  Portable exam 2110 hours compared to 1554 hours FINDINGS: Normal heart size, mediastinal contours, and pulmonary vascularity. Atherosclerotic calcification aorta. Biapical scarring. Lungs otherwise clear. No acute infiltrate, pleural effusion or pneumothorax. Bones unremarkable. IMPRESSION: No acute abnormalities. Electronically Signed   By: Lavonia Dana M.D.   On: 10/12/2019 21:32   Dg Chest Portable 1 View  Result Date: 10/12/2019 CLINICAL DATA:  Fever, abdominal pain. EXAM: PORTABLE CHEST 1 VIEW COMPARISON:  Chest x-rays dated 05/18/2017 and 10/18/2016. FINDINGS: Heart size and mediastinal contours are within normal limits. Biapical pleuroparenchymal scarring/fibrosis. Lungs otherwise clear. No confluent opacity to suggest a developing pneumonia. No pleural effusion. Osseous structures  about the chest are unremarkable. IMPRESSION: No active cardiopulmonary disease. No evidence of pneumonia or pulmonary edema. Electronically Signed   By: Franki Cabot M.D.   On: 10/12/2019 16:14         Discharge Exam: Vitals:   10/16/19 0512 10/16/19 0542  BP: 131/84   Pulse: (!) 111 68  Resp: 16   Temp: (!) 100.4 F (38 C)   SpO2: 97% 94%   Vitals:   10/15/19 1447 10/15/19 2146 10/16/19 0512 10/16/19 0542  BP: 128/76 139/83 131/84   Pulse: (!) 101 95 (!) 111 68  Resp: 19 17 16    Temp: 99 F (37.2 C) 99.2 F (37.3 C) (!) 100.4 F (38 C)   TempSrc: Oral Oral Oral   SpO2: 98% 100% 97% 94%  Weight:      Height:        General: Pt is alert, awake, not in acute distress Cardiovascular: RRR, S1/S2 +, no rubs, no gallops Respiratory: CTA bilaterally, no wheezing, no rhonchi Abdominal: Soft, NT, ND, bowel sounds + Extremities: no edema, no cyanosis   The results of significant diagnostics from this hospitalization (including imaging, microbiology, ancillary and laboratory) are listed below for reference.    Significant Diagnostic Studies: Ct Abdomen Pelvis W Contrast  Result Date: 10/12/2019 CLINICAL DATA:  Abdominal pain. Urinary and bowel incontinence. Emesis. EXAM: CT ABDOMEN AND PELVIS WITH CONTRAST TECHNIQUE: Multidetector CT imaging of the abdomen and pelvis was performed using the standard protocol following bolus administration of intravenous contrast. CONTRAST:  65mL OMNIPAQUE IOHEXOL 300 MG/ML  SOLN COMPARISON:  06/28/2016 FINDINGS: Lower chest: Choose 1 Hepatobiliary: Liver normal in  size and overall attenuation. 11 mm low-attenuation lesion in right lobe, segment 7, not well-defined on the prior CT that was performed without contrast. This is likely a hemangioma. No other liver masses or lesions. Small gallstones suggested dependently a mildly distended gallbladder. No wall thickening or inflammation. No bile duct dilation. Pancreas: Unremarkable. No pancreatic  ductal dilatation or surrounding inflammatory changes. Spleen: Prominent spleen, 12 cm in long axis increased from 10 cm on the prior CT. No splenic mass or focal lesion. Adrenals/Urinary Tract: No adrenal masses. Bilateral renal cortical thinning. There are multiple areas of relative decreased renal enhancement bilaterally, most evident on the delayed sequence. No defined renal masses, no stones and no hydronephrosis. Mild stranding in the perinephric spaces, without a perinephric fluid collection. Ureters are normal in course and in caliber. Bladder is unremarkable. Stomach/Bowel: Unremarkable stomach. Colon and small bowel are normal in caliber. Mild wall thickening of the terminal ileum. No other small bowel wall thickening. There is mild wall prominence at the ileocecal valve, in the right colon. Remaining portions of the colon shows normal wall thickness. No mesenteric inflammation. Vascular/Lymphatic: Aortic atherosclerosis. No aneurysm. There is plaque at the origin of the renal arteries, but no definite hemodynamically significant stenosis. No enlarged lymph nodes. Reproductive: Uterus and bilateral adnexa are unremarkable. Other: No abdominal wall hernia or abnormality. No abdominopelvic ascites. Musculoskeletal: Compression fractures of T12 and L1. Slight depression of the upper endplate of L2 and lower endplate of L3. Small node along the upper endplate of L4. The compression fracture of L1 has increased in severity from the prior CT. The compression fracture of T12 is new. These do not appear acute or recent, however. No other fractures. No osteoblastic or osteolytic bone lesions. IMPRESSION: 1. Multiple areas of decreased enhancement in the kidneys, best seen on the delayed sequence. This is consistent with bilateral pyelonephritis in the proper clinical setting. No evidence of a renal or perinephric abscess. No hydronephrosis. 2. Mild wall thickening of the terminal ileum and right colon at the  ileocecal valve, without adjacent inflammation. Consider infectious or inflammatory ileitis. 3. No other evidence of an acute abnormality. 4. Aortic atherosclerosis. 5. Liver lesion that is most likely a hemangioma. 6. Spleen appears borderline enlarged and larger than it was previously. Etiology of this is unclear. Electronically Signed   By: Lajean Manes M.D.   On: 10/12/2019 19:28   Dg Chest Portable 1 View  Result Date: 10/12/2019 CLINICAL DATA:  Shortness of breath EXAM: PORTABLE CHEST 1 VIEW COMPARISON:  Portable exam 2110 hours compared to 1554 hours FINDINGS: Normal heart size, mediastinal contours, and pulmonary vascularity. Atherosclerotic calcification aorta. Biapical scarring. Lungs otherwise clear. No acute infiltrate, pleural effusion or pneumothorax. Bones unremarkable. IMPRESSION: No acute abnormalities. Electronically Signed   By: Lavonia Dana M.D.   On: 10/12/2019 21:32   Dg Chest Portable 1 View  Result Date: 10/12/2019 CLINICAL DATA:  Fever, abdominal pain. EXAM: PORTABLE CHEST 1 VIEW COMPARISON:  Chest x-rays dated 05/18/2017 and 10/18/2016. FINDINGS: Heart size and mediastinal contours are within normal limits. Biapical pleuroparenchymal scarring/fibrosis. Lungs otherwise clear. No confluent opacity to suggest a developing pneumonia. No pleural effusion. Osseous structures about the chest are unremarkable. IMPRESSION: No active cardiopulmonary disease. No evidence of pneumonia or pulmonary edema. Electronically Signed   By: Franki Cabot M.D.   On: 10/12/2019 16:14     Microbiology: Recent Results (from the past 240 hour(s))  Blood culture (routine x 2)     Status: Abnormal  Collection Time: 10/12/19  4:52 PM   Specimen: BLOOD LEFT ARM  Result Value Ref Range Status   Specimen Description   Final    BLOOD LEFT ARM Performed at [facility], [address], [place], [place] [address]    Special Requests   Final    BOTTLES DRAWN AEROBIC AND ANAEROBIC Blood Culture adequate  volume Performed at [facility], [address], [place],  [address]    Culture  Setup Time   Final    GRAM NEGATIVE RODS Gram Stain Report Called to,Read Back By and Verified With: HYLTON,L ON 10/13/19 AT 1800 BY LOY,C AEROBIC BOTTLE ONLY PERFORMED AT [facility] Organism ID to follow CRITICAL RESULT CALLED TO, READ BACK BY AND VERIFIED WITHMiquel Dunn RN [id number] 10/13/19 A BROWNING Performed at [facility], [place] [address], [place], [place] [address]    Culture ESCHERICHIA COLI (A)  Final   Report Status 10/15/2019 FINAL  Final   Organism ID, Bacteria ESCHERICHIA COLI  Final      Susceptibility   Escherichia coli - MIC*    AMPICILLIN 4 SENSITIVE Sensitive     CEFAZOLIN <=4 SENSITIVE Sensitive     CEFEPIME <=1 SENSITIVE Sensitive     CEFTAZIDIME <=1 SENSITIVE Sensitive     CEFTRIAXONE <=1 SENSITIVE Sensitive     CIPROFLOXACIN <=0.25 SENSITIVE Sensitive     GENTAMICIN <=1 SENSITIVE Sensitive     IMIPENEM <=0.25 SENSITIVE Sensitive     TRIMETH/SULFA <=20 SENSITIVE Sensitive     AMPICILLIN/SULBACTAM <=2 SENSITIVE Sensitive     PIP/TAZO <=4 SENSITIVE Sensitive     Extended ESBL NEGATIVE Sensitive     * ESCHERICHIA COLI  Blood Culture ID Panel (Reflexed)     Status: Abnormal   Collection Time: 10/12/19  4:52 PM  Result Value Ref Range Status   Enterococcus species NOT DETECTED NOT DETECTED Final   Listeria monocytogenes NOT DETECTED NOT DETECTED Final   Staphylococcus species NOT DETECTED NOT DETECTED Final   Staphylococcus aureus (BCID) NOT DETECTED NOT DETECTED Final   Streptococcus species NOT DETECTED NOT DETECTED Final   Streptococcus agalactiae NOT DETECTED NOT DETECTED Final   Streptococcus pneumoniae NOT DETECTED NOT DETECTED Final   Streptococcus pyogenes NOT DETECTED NOT DETECTED Final   Acinetobacter baumannii NOT DETECTED NOT DETECTED Final   Enterobacteriaceae species DETECTED (A) NOT DETECTED Final    Comment: Enterobacteriaceae represent a large family of  gram-negative bacteria, not a single organism. CRITICAL RESULT CALLED TO, READ BACK BY AND VERIFIED WITH: Miquel Dunn RN [id number] 10/13/19 A BROWNING    Enterobacter cloacae complex NOT DETECTED NOT DETECTED Final   Escherichia coli DETECTED (A) NOT DETECTED Final    Comment: CRITICAL RESULT CALLED TO, READ BACK BY AND VERIFIED WITH: Miquel Dunn RN [id number] 10/13/19 A BROWNING    Klebsiella oxytoca NOT DETECTED NOT DETECTED Final   Klebsiella pneumoniae NOT DETECTED NOT DETECTED Final   Proteus species NOT DETECTED NOT DETECTED Final   Serratia marcescens NOT DETECTED NOT DETECTED Final   Carbapenem resistance NOT DETECTED NOT DETECTED Final   Haemophilus influenzae NOT DETECTED NOT DETECTED Final   Neisseria meningitidis NOT DETECTED NOT DETECTED Final   Pseudomonas aeruginosa NOT DETECTED NOT DETECTED Final   Candida albicans NOT DETECTED NOT DETECTED Final   Candida glabrata NOT DETECTED NOT DETECTED Final   Candida krusei NOT DETECTED NOT DETECTED Final   Candida parapsilosis NOT DETECTED NOT DETECTED Final   Candida tropicalis NOT DETECTED NOT DETECTED Final  Comment: Performed at [facility], [place] [address], [place], [place] [address]  Blood culture (routine x 2)     Status: None   Collection Time: 10/12/19  5:08 PM   Specimen: Left Antecubital; Blood  Result Value Ref Range Status   Specimen Description LEFT ANTECUBITAL  Final   Special Requests   Final    BOTTLES DRAWN AEROBIC AND ANAEROBIC Blood Culture results may not be optimal due to an excessive volume of blood received in culture bottles   Culture   Final    NO GROWTH 5 DAYS Performed at [facility], [address] [place], [place] [address]    Report Status 10/17/2019 FINAL  Final  SARS CORONAVIRUS 2 (Brindle Leyba 6-24 HRS) Nasopharyngeal Nasopharyngeal Swab     Status: None   Collection Time: 10/12/19  7:34 PM   Specimen: Nasopharyngeal Swab  Result Value Ref Range Status   SARS Coronavirus 2 NEGATIVE NEGATIVE Final    Comment:  (NOTE) SARS-CoV-2 target nucleic acids are NOT DETECTED. The SARS-CoV-2 RNA is generally detectable in upper and lower respiratory specimens during the acute phase of infection. Negative results do not preclude SARS-CoV-2 infection, do not rule out co-infections with other pathogens, and should not be used as the sole basis for treatment or other patient management decisions. Negative results must be combined with clinical observations, patient history, and epidemiological information. The expected result is Negative. Fact Sheet for Patients: SugarRoll.be Fact Sheet for Healthcare Providers: https://www.woods-mathews.com/ This test is not yet approved or cleared by the [place] FDA and  has been authorized for detection and/or diagnosis of SARS-CoV-2 by FDA under an Emergency Use Authorization (EUA). This EUA will remain  in effect (meaning this test can be used) for the duration of the COVID-19 declaration under Section 56 4(b)(1) of the Act, 21 U.S.C. section 360bbb-3(b)(1), unless the authorization is terminated or revoked sooner. Performed at [facility], [place] [address] [place], [place] [address]   MRSA PCR Screening     Status: None   Collection Time: 10/12/19 11:57 PM   Specimen: Nasal Mucosa; Nasopharyngeal  Result Value Ref Range Status   MRSA by PCR NEGATIVE NEGATIVE Final    Comment:        The GeneXpert MRSA Assay (FDA approved for NASAL specimens only), is one component of a comprehensive MRSA colonization surveillance program. It is not intended to diagnose MRSA infection nor to guide or monitor treatment for MRSA infections. Performed at [facility], [address], [place], [place] [address]      Labs: Basic Metabolic Panel: Recent Labs  Lab 10/12/19 1449 10/12/19 1458 10/13/19 0351 10/15/19 0410 10/16/19 0516  NA 128*  --  138 137 137  K 3.0*  --  4.2 3.1* 3.2*  CL 93*  --  106 104 107  CO2 22   --  19* 23 19*  GLUCOSE 116*  --  112* 107* 111*  BUN 17  --  15 7* 6*  CREATININE 1.34*  --  1.29* 1.14* 1.06*  CALCIUM 8.1*  --  7.7* 8.0* 7.8*  MG  --  1.8  --   --  1.9  PHOS  --  2.0*  --   --   --    Liver Function Tests: Recent Labs  Lab 10/12/19 1449 10/13/19 0351  AST 20 17  ALT 11 9  ALKPHOS 73 60  BILITOT 1.1 0.8  PROT 7.2 6.1*  ALBUMIN 3.6 2.9*   Recent Labs  Lab 10/12/19 1449  LIPASE 17   No results for input(s): AMMONIA in the last 168 hours. CBC: Recent Labs  Lab 10/12/19 1449 10/13/19 0351 10/14/19 0417 10/15/19 0410 10/16/19 0516  WBC 10.0 7.5 5.4 5.1 6.0  NEUTROABS 8.2* 5.6 3.4  --   --   HGB 11.1* 10.0* 9.6* 9.3* 9.7*  HCT 34.1* 32.2* 30.4* 29.6* 30.1*  MCV 94.5 97.0 96.5 95.5 94.1  PLT PLATELET CLUMPS NOTED ON SMEAR, COUNT APPEARS ADEQUATE 102* 120* 130* 168   Cardiac Enzymes: No results for input(s): CKTOTAL, CKMB, CKMBINDEX, TROPONINI in the last 168 hours. BNP: Invalid input(s): POCBNP CBG: No results for input(s): GLUCAP in the last 168 hours.  Time coordinating discharge:  36 minutes  Signed:  Orson Eva, DO [facility] Pager: [phone] 10/17/2019, 4:01 PM

## 2019-10-17 NOTE — Telephone Encounter (Signed)
appt made

## 2019-10-22 ENCOUNTER — Ambulatory Visit (INDEPENDENT_AMBULATORY_CARE_PROVIDER_SITE_OTHER): Payer: Medicare Other | Admitting: Family Medicine

## 2019-10-22 ENCOUNTER — Encounter: Payer: Self-pay | Admitting: Family Medicine

## 2019-10-22 DIAGNOSIS — N10 Acute tubulo-interstitial nephritis: Secondary | ICD-10-CM

## 2019-10-22 DIAGNOSIS — A4151 Sepsis due to Escherichia coli [E. coli]: Secondary | ICD-10-CM | POA: Diagnosis not present

## 2019-10-22 NOTE — Progress Notes (Signed)
    Subjective:    Patient ID: [name], female    DOB: Jun 12, 1946, 73 y.o.   MRN: [id number]   HPI: [name] is a 73 y.o. female presenting for hospital follow up. Admitted on 11/1 for pyelonephritis.Blood culture positive for E.coli. Now feeling much better. Now no edema or dysuria.    Depression screen [facility] 2/9 08/20/2019 08/01/2019 06/02/2019 04/30/2019 12/27/2018  Decreased Interest 0 0 0 0 1  Down, Depressed, Hopeless 0 0 0 0 0  PHQ - 2 Score 0 0 0 0 1  Altered sleeping - - 0 0 -  Tired, decreased energy - - 0 0 -  Change in appetite - - 0 0 -  Feeling bad or failure about yourself  - - 0 0 -  Trouble concentrating - - 0 0 -  Moving slowly or fidgety/restless - - 0 0 -  Suicidal thoughts - - 0 0 -  PHQ-9 Score - - 0 0 -  Some recent data might be hidden     Relevant past medical, surgical, family and social history reviewed and updated as indicated.  Interim medical history since our last visit reviewed. Allergies and medications reviewed and updated.  ROS:  Review of Systems  Constitutional: Negative.   HENT: Negative.   Eyes: Negative for visual disturbance.  Respiratory: Negative for shortness of breath.   Cardiovascular: Negative for chest pain.  Gastrointestinal: Negative for abdominal pain.  Musculoskeletal: Negative for arthralgias.     Social History   Tobacco Use  Smoking Status Former Smoker  . Packs/day: 2.00  . Years: 30.00  . Pack years: 60.00  . Types: Cigarettes  . Quit date: 06/17/1992  . Years since quitting: 27.3  Smokeless Tobacco Never Used       Objective:     Wt Readings from Last 3 Encounters:  10/14/19 163 lb 2.3 oz (74 kg)  06/02/19 160 lb (72.6 kg)  04/30/19 161 lb (73 kg)     Exam deferred. Pt. Harboring due to COVID 19. Phone visit performed.   Assessment & Plan:   1. Acute pyelonephritis   2. Sepsis due to Escherichia coli, unspecified whether acute organ dysfunction present ([place])     No orders of the  defined types were placed in this encounter.   No orders of the defined types were placed in this encounter.     Diagnoses and all orders for this visit:  Acute pyelonephritis  Sepsis due to Escherichia coli, unspecified whether acute organ dysfunction present [facility])    Virtual Visit via telephone Note  I discussed the limitations, risks, security and privacy concerns of performing an evaluation and management service by telephone and the availability of in person appointments. The patient was identified with two identifiers. Pt.expressed understanding and agreed to proceed. Pt. Is at home. Dr. Livia Snellen is in his office.  Follow Up Instructions:   I discussed the assessment and treatment plan with the patient. The patient was provided an opportunity to ask questions and all were answered. The patient agreed with the plan and demonstrated an understanding of the instructions.   The patient was advised to call back or seek an in-person evaluation if the symptoms worsen or if the condition fails to improve as anticipated.   Total minutes including chart review and phone contact time: 20   Follow up plan: No follow-ups on file.  Claretta Fraise, MD [place]

## 2019-10-24 ENCOUNTER — Telehealth: Payer: Self-pay | Admitting: Family Medicine

## 2019-10-24 ENCOUNTER — Other Ambulatory Visit: Payer: Self-pay | Admitting: Family Medicine

## 2019-10-27 NOTE — Telephone Encounter (Signed)
Please contact the patient See how she is doing since this is left over from Friday. Add her to my acute day on Wednesday. If dyspneic add her to today's acute provider

## 2019-10-27 NOTE — Telephone Encounter (Signed)
Pt called and wed appt made

## 2019-10-28 ENCOUNTER — Other Ambulatory Visit: Payer: Self-pay

## 2019-10-29 ENCOUNTER — Ambulatory Visit (INDEPENDENT_AMBULATORY_CARE_PROVIDER_SITE_OTHER): Payer: Medicare Other | Admitting: Family Medicine

## 2019-10-29 ENCOUNTER — Encounter: Payer: Self-pay | Admitting: Family Medicine

## 2019-10-29 ENCOUNTER — Other Ambulatory Visit: Payer: Self-pay

## 2019-10-29 VITALS — BP 180/91 | HR 78 | Temp 97.1°F | Resp 20 | Ht 68.0 in | Wt 164.0 lb

## 2019-10-29 DIAGNOSIS — R600 Localized edema: Secondary | ICD-10-CM

## 2019-10-29 DIAGNOSIS — N10 Acute tubulo-interstitial nephritis: Secondary | ICD-10-CM

## 2019-10-29 DIAGNOSIS — E038 Other specified hypothyroidism: Secondary | ICD-10-CM | POA: Diagnosis not present

## 2019-10-29 DIAGNOSIS — I10 Essential (primary) hypertension: Secondary | ICD-10-CM

## 2019-10-29 LAB — MICROSCOPIC EXAMINATION
Bacteria, UA: NONE SEEN
Renal Epithel, UA: NONE SEEN /hpf

## 2019-10-29 LAB — URINALYSIS, COMPLETE
Bilirubin, UA: NEGATIVE
Glucose, UA: NEGATIVE
Ketones, UA: NEGATIVE
Leukocytes,UA: NEGATIVE
Nitrite, UA: NEGATIVE
Protein,UA: NEGATIVE
Specific Gravity, UA: 1.015 (ref 1.005–1.030)
Urobilinogen, Ur: 0.2 mg/dL (ref 0.2–1.0)
pH, UA: 7 (ref 5.0–7.5)

## 2019-10-29 MED ORDER — METOPROLOL SUCCINATE ER 50 MG PO TB24: 50.0000 mg | ORAL_TABLET | Freq: Every day | ORAL | 2 refills | Status: DC

## 2019-10-29 NOTE — Progress Notes (Signed)
Subjective:  Patient ID: [name], female    DOB: 1946-03-09  Age: 73 y.o. MRN: [id number]  CC: Swelling in feet   HPI [name] presents for edema starting last week. No  Dyspnea or chest pain. No fever, chills. Energy is improving slowly since hospitalization 2 weeks ago for e.coli sepsis. [name] lood pressure is elevated. She is aking meds as prescribed. Has some HA behind eyes.  Depression screen [facility] 2/9 10/29/2019 08/20/2019 08/01/2019  Decreased Interest 0 0 0  Down, Depressed, Hopeless 0 0 0  PHQ - 2 Score 0 0 0  Altered sleeping - - -  Tired, decreased energy - - -  Change in appetite - - -  Feeling bad or failure about yourself  - - -  Trouble concentrating - - -  Moving slowly or fidgety/restless - - -  Suicidal thoughts - - -  PHQ-9 Score - - -  Some recent data might be hidden    History [name] has a past medical history of Alcoholism ([place]) (01/09/2012), Anemia, Anxiety, Arthritis, B12 deficiency, Carotid artery narrowing (10/03/2012), Chronic kidney disease (CKD), stage II (mild), COPD (chronic obstructive pulmonary disease) ([place]), Depression, Headache(784.0), Hyperlipidemia, Hypertension, Hyponatremia (01/09/2012), Hypothyroid, Menopause, Mini stroke ([place]), Orthostatic syncope (10/02/2012), Pneumonia, SBO (small bowel obstruction) ([place]) (06/28/2016), Shortness of breath, and Stroke ([place]) (2010).   She has a past surgical history that includes Appendectomy; Tonsillectomy; Cataract extraction w/PHACO (06/20/2012); Cataract extraction w/PHACO (07/11/2012); Colonoscopy (N/A, 12/17/2015); Esophagogastroduodenoscopy (N/A, 12/17/2015); Agile capsule (N/A, 02/22/2016); Knee arthroscopy (Right, 09/2016); and Lumbar laminectomy/decompression microdiscectomy (N/A, 05/25/2017).   [name] family history includes GER disease in [name] mother; Hypertension in [name] maternal grandmother; Ulcers in [name] mother.She reports that she quit smoking about 27 years ago. [name] smoking use included cigarettes. She  has a 60.00 pack-year smoking history. She has never used smokeless tobacco. She reports that she does not drink alcohol or use drugs.    ROS Review of Systems  Constitutional: Negative.   HENT: Negative for congestion.   Eyes: Negative for visual disturbance.  Respiratory: Negative for shortness of breath.   Cardiovascular: Positive for leg swelling. Negative for chest pain.  Gastrointestinal: Negative for abdominal pain, constipation, diarrhea, nausea and vomiting.  Genitourinary: Negative for difficulty urinating.  Musculoskeletal: Negative for arthralgias and myalgias.  Neurological: Positive for headaches.  Psychiatric/Behavioral: Negative for sleep disturbance.    Objective:  BP (!) 180/91   Pulse 78   Temp (!) 97.1 F (36.2 C) (Temporal)   Resp 20   Ht 5\' 8"  (1.727 m)   Wt 164 lb (74.4 kg)   LMP 08/21/2013   SpO2 96%   BMI 24.94 kg/m   BP Readings from Last 3 Encounters:  10/29/19 (!) 180/91  10/16/19 131/84  06/02/19 (!) 143/85    Wt Readings from Last 3 Encounters:  10/29/19 164 lb (74.4 kg)  10/14/19 163 lb 2.3 oz (74 kg)  06/02/19 160 lb (72.6 kg)     Physical Exam Constitutional:      General: She is not in acute distress.    Appearance: She is well-developed.  HENT:     Head: Normocephalic and atraumatic.     Right Ear: External ear normal.     Left Ear: External ear normal.     Nose: Nose normal.  Eyes:     Conjunctiva/sclera: Conjunctivae normal.     Pupils: Pupils are equal, round, and reactive to light.  Neck:     Musculoskeletal: Normal range of motion  and neck supple.     Thyroid: No thyromegaly.  Cardiovascular:     Rate and Rhythm: Normal rate and regular rhythm.     Heart sounds: Normal heart sounds.  Pulmonary:     Effort: Pulmonary effort is normal. No respiratory distress.     Breath sounds: Normal breath sounds. No wheezing or rales.  Abdominal:     General: Bowel sounds are normal. There is no distension.     Palpations:  Abdomen is soft.     Tenderness: There is no abdominal tenderness.  Musculoskeletal:        General: Swelling (2-3+ BLE) present.  Lymphadenopathy:     Cervical: No cervical adenopathy.  Skin:    General: Skin is warm and dry.  Neurological:     Mental Status: She is alert and oriented to person, place, and time.     Deep Tendon Reflexes: Reflexes are normal and symmetric.  Psychiatric:        Behavior: Behavior normal.        Thought Content: Thought content normal.        Judgment: Judgment normal.     Results for orders placed or performed in visit on 10/29/19  Microscopic Examination   URINE  Result Value Ref Range   WBC, UA 0-5 0 - 5 /hpf   RBC 0-2 0 - 2 /hpf   Epithelial Cells (non renal) 0-10 0 - 10 /hpf   Renal Epithel, UA None seen None seen /hpf   Bacteria, UA None seen None seen/Few  urinalysis- dip and micro  Result Value Ref Range   Specific Gravity, UA 1.015 1.005 - 1.030   pH, UA 7.0 5.0 - 7.5   Color, UA Yellow Yellow   Appearance Ur Clear Clear   Leukocytes,UA Negative Negative   Protein,UA Negative Negative/Trace   Glucose, UA Negative Negative   Ketones, UA Negative Negative   RBC, UA Trace (A) Negative   Bilirubin, UA Negative Negative   Urobilinogen, Ur 0.2 0.2 - 1.0 mg/dL   Nitrite, UA Negative Negative   Microscopic Examination See below:      Assessment & Plan:   [name] was seen today for swelling in feet.  Diagnoses and all orders for this visit:  Acute pyelonephritis -     CBC -     CMP -     urinalysis- dip and micro -     Urine culture  Localized edema -     CBC -     CMP -     urinalysis- dip and micro -     Urine culture -     TSH + free T4  Accelerated hypertension  Other specified hypothyroidism -     TSH + free T4  Other orders -     metoprolol succinate (TOPROL-XL) 50 MG 24 hr tablet; Take 1 tablet (50 mg total) by mouth daily. For blood pressure control -     Microscopic Examination       I am having  [name]. Price start on metoprolol succinate. I am also having [name] maintain [name] aspirin EC, thiamine, Co 0000000, folic acid, levothyroxine, alendronate, ondansetron, liothyronine, nabumetone, rosuvastatin, DULoxetine, olmesartan-hydrochlorothiazide, and levofloxacin.  Allergies as of 10/29/2019      Reactions   Ciprofloxacin Other (See Comments)   Caused pt to have delusional thoughts and hallucinations   Procardia [nifedipine] Swelling   Edema    Norvasc [amlodipine Besylate] Hives      Medication List  Accurate as of October 29, 2019  5:42 PM. If you have any questions, ask your nurse or doctor.        alendronate 70 MG tablet Commonly known as: FOSAMAX Take 70 mg by mouth once a week. Take with a full glass of water on an empty stomach.   aspirin EC 81 MG tablet Take 81 mg by mouth daily.   Co Q-10 100 MG Caps Take 100 mg by mouth daily.   DULoxetine 60 MG capsule Commonly known as: Cymbalta Take 1 capsule (60 mg total) by mouth daily.   folic acid Q000111Q MCG tablet Commonly known as: FOLVITE Take 2,000 mcg by mouth daily.   levofloxacin 500 MG tablet Commonly known as: LEVAQUIN Take 1 tablet (500 mg total) by mouth daily.   levothyroxine 112 MCG tablet Commonly known as: SYNTHROID Take 1 tablet (112 mcg total) by mouth daily.   liothyronine 5 MCG tablet Commonly known as: Cytomel Take 2 tablets (10 mcg total) by mouth daily.   metoprolol succinate 50 MG 24 hr tablet Commonly known as: TOPROL-XL Take 1 tablet (50 mg total) by mouth daily. For blood pressure control Started by: Claretta Fraise, MD   nabumetone 500 MG tablet Commonly known as: RELAFEN TAKE 2 TABLETS (1,000 MG TOTAL) BY MOUTH 2 (TWO) TIMES DAILY FOR MUSCLE AND JOINT PAIN   olmesartan-hydrochlorothiazide 40-25 MG tablet Commonly known as: Benicar HCT Take 1 tablet by mouth daily.   ondansetron 4 MG tablet Commonly known as: ZOFRAN Take 1 tablet (4 mg total) by mouth every 6 (six) hours as  needed for nausea.   rosuvastatin 10 MG tablet Commonly known as: CRESTOR TAKE 1 TABLET EVERY DAY   thiamine 100 MG tablet Commonly known as: VITAMIN B-1 Take 1 tablet (100 mg total) by mouth daily.        Follow-up: No follow-ups on file.  Claretta Fraise, M.D.

## 2019-10-30 ENCOUNTER — Telehealth: Payer: Self-pay | Admitting: Family Medicine

## 2019-10-30 ENCOUNTER — Other Ambulatory Visit: Payer: Self-pay | Admitting: Family Medicine

## 2019-10-30 LAB — CBC WITH DIFFERENTIAL/PLATELET
Basophils Absolute: 0.1 10*3/uL (ref 0.0–0.2)
Basos: 1 %
EOS (ABSOLUTE): 0.1 10*3/uL (ref 0.0–0.4)
Eos: 1 %
Hematocrit: 35.2 % (ref 34.0–46.6)
Hemoglobin: 11.7 g/dL (ref 11.1–15.9)
Immature Grans (Abs): 0 10*3/uL (ref 0.0–0.1)
Immature Granulocytes: 0 %
Lymphocytes Absolute: 2.1 10*3/uL (ref 0.7–3.1)
Lymphs: 37 %
MCH: 30.1 pg (ref 26.6–33.0)
MCHC: 33.2 g/dL (ref 31.5–35.7)
MCV: 91 fL (ref 79–97)
Monocytes Absolute: 0.5 10*3/uL (ref 0.1–0.9)
Monocytes: 9 %
Neutrophils Absolute: 3 10*3/uL (ref 1.4–7.0)
Neutrophils: 52 %
Platelets: 365 10*3/uL (ref 150–450)
RBC: 3.89 x10E6/uL (ref 3.77–5.28)
RDW: 13.4 % (ref 11.7–15.4)
WBC: 5.8 10*3/uL (ref 3.4–10.8)

## 2019-10-30 LAB — CMP14+EGFR
ALT: 5 IU/L (ref 0–32)
AST: 9 IU/L (ref 0–40)
Albumin/Globulin Ratio: 1.7 (ref 1.2–2.2)
Albumin: 4.5 g/dL (ref 3.7–4.7)
Alkaline Phosphatase: 73 IU/L (ref 39–117)
BUN/Creatinine Ratio: 12 (ref 12–28)
BUN: 15 mg/dL (ref 8–27)
Bilirubin Total: 0.5 mg/dL (ref 0.0–1.2)
CO2: 21 mmol/L (ref 20–29)
Calcium: 9.6 mg/dL (ref 8.7–10.3)
Chloride: 95 mmol/L — ABNORMAL LOW (ref 96–106)
Creatinine, Ser: 1.25 mg/dL — ABNORMAL HIGH (ref 0.57–1.00)
GFR calc Af Amer: 50 mL/min/{1.73_m2} — ABNORMAL LOW (ref >59)
GFR calc non Af Amer: 43 mL/min/{1.73_m2} — ABNORMAL LOW (ref >59)
Globulin, Total: 2.7 g/dL (ref 1.5–4.5)
Glucose: 99 mg/dL (ref 65–99)
Potassium: 3.8 mmol/L (ref 3.5–5.2)
Sodium: 134 mmol/L (ref 134–144)
Total Protein: 7.2 g/dL (ref 6.0–8.5)

## 2019-10-30 LAB — URINE CULTURE

## 2019-10-30 MED ORDER — NABUMETONE 500 MG PO TABS: 1000.0000 mg | ORAL_TABLET | Freq: Two times a day (BID) | ORAL | 1 refills | Status: DC

## 2019-10-30 NOTE — Telephone Encounter (Signed)
Patient aware and verbalizes understanding. 

## 2019-10-30 NOTE — Telephone Encounter (Signed)
lmtcb

## 2019-10-30 NOTE — Progress Notes (Signed)
Hello Pansey,  Your lab result is normal and/or stable.Some minor variations that are not significant are commonly marked abnormal, but do not represent any medical problem for you.  Best regards, Audery Wassenaar, M.D.

## 2019-10-30 NOTE — Telephone Encounter (Signed)
Please contact the patient I sent in the nabumetone. I want her to take the Benicar HCTZ (olmesartan HCTZ is the generic &  That's okay too.)  Thanks, WS

## 2019-10-30 NOTE — Telephone Encounter (Signed)
Patient states she requested a refill of nabumetone 500mg  yesterday and it was never sent in. Please send in.  Patient also states that she was previously on olmesartan HCTZ and then for the last 2 1/2 months she has only been on olmesartan.  States she is now on olmesartan HCTZ but wanted Dr. Livia Snellen to know that she has been without the HCTZ and that is why her ankles are swelling.  Please clarify the correct medication patient should be taking.

## 2019-10-31 LAB — TSH+FREE T4
Free T4: 0.62 ng/dL — ABNORMAL LOW (ref 0.82–1.77)
TSH: 89.1 u[IU]/mL — ABNORMAL HIGH (ref 0.450–4.500)

## 2019-10-31 LAB — SPECIMEN STATUS REPORT

## 2019-11-03 ENCOUNTER — Other Ambulatory Visit: Payer: Self-pay | Admitting: Family Medicine

## 2019-11-03 ENCOUNTER — Other Ambulatory Visit: Payer: Self-pay | Admitting: *Deleted

## 2019-11-03 DIAGNOSIS — E039 Hypothyroidism, unspecified: Secondary | ICD-10-CM

## 2019-11-03 MED ORDER — LEVOTHYROXINE SODIUM 137 MCG PO TABS: 137.0000 ug | ORAL_TABLET | Freq: Every day | ORAL | 1 refills | Status: DC

## 2019-11-19 ENCOUNTER — Encounter: Payer: Self-pay | Admitting: Internal Medicine

## 2019-11-19 ENCOUNTER — Telehealth: Payer: Self-pay | Admitting: Internal Medicine

## 2019-11-19 ENCOUNTER — Ambulatory Visit: Payer: Medicare Other | Admitting: Gastroenterology

## 2019-11-19 NOTE — Telephone Encounter (Signed)
Patient was a no show and letter sent  °

## 2019-11-19 NOTE — Progress Notes (Deleted)
Primary Care Physician: Claretta Fraise, MD  Primary Gastroenterologist:  Garfield Cornea, MD   No chief complaint on file.   HPI: [name] is a 73 y.o. female here for hospital follow-up.  She was in the hospital last month with several day history of lower abdominal pain, urinary symptoms, diarrhea.  CT revealed focal wall thickening to the terminal ileum.  Blood cultures positive for E. coli.  Patient has a history of extensive ulceration to the ascending colon and cecum in January 2017 and at the time was felt to be secondary to NSAID use.  Patient denies any recent NSAIDs.  Dr. Laural Golden saw patient for [place] while covering hospital for [place].  He suspected acute ileitis secondary to E. coli sepsis.  Patient's diarrhea had resolved by the time he saw the patient in consultation.  CT also showed right hepatic lobe lesion measuring 11 mm felt to be hemangioma.  Recommend a liver protocol CT in 6 months or so to document stability of the liver lesion hopefully prove that it to be a hemangioma.   Current Outpatient Medications  Medication Sig Dispense Refill  . alendronate (FOSAMAX) 70 MG tablet Take 70 mg by mouth once a week. Take with a full glass of water on an empty stomach.    Marland Kitchen aspirin EC 81 MG tablet Take 81 mg by mouth daily.    . Coenzyme Q10 (CO Q-10) 100 MG CAPS Take 100 mg by mouth daily. 100 each 10  . DULoxetine (CYMBALTA) 60 MG capsule Take 1 capsule (60 mg total) by mouth daily. 90 capsule 1  . folic acid (FOLVITE) Q000111Q MCG tablet Take 2,000 mcg by mouth daily.    Marland Kitchen levofloxacin (LEVAQUIN) 500 MG tablet Take 1 tablet (500 mg total) by mouth daily. 10 tablet 0  . levothyroxine (SYNTHROID) 137 MCG tablet Take 1 tablet (137 mcg total) by mouth daily. 30 tablet 1  . liothyronine (CYTOMEL) 5 MCG tablet Take 2 tablets (10 mcg total) by mouth daily. 180 tablet 1  . metoprolol succinate (TOPROL-XL) 50 MG 24 hr tablet Take 1 tablet (50 mg total) by mouth daily. For blood pressure  control 30 tablet 2  . nabumetone (RELAFEN) 500 MG tablet Take 2 tablets (1,000 mg total) by mouth 2 (two) times daily. For muscle and joint pain 360 tablet 1  . olmesartan-hydrochlorothiazide (BENICAR HCT) 40-25 MG tablet Take 1 tablet by mouth daily. 90 tablet 1  . ondansetron (ZOFRAN) 4 MG tablet Take 1 tablet (4 mg total) by mouth every 6 (six) hours as needed for nausea. 20 tablet 0  . rosuvastatin (CRESTOR) 10 MG tablet TAKE 1 TABLET EVERY DAY 90 tablet 1  . thiamine (VITAMIN B-1) 100 MG tablet Take 1 tablet (100 mg total) by mouth daily. 90 tablet 3   No current facility-administered medications for this visit.     Allergies as of 11/19/2019 - Review Complete 10/29/2019  Allergen Reaction Noted  . Ciprofloxacin Other (See Comments) 08/15/2016  . Procardia [nifedipine] Swelling 04/21/2011  . Norvasc [amlodipine besylate] Hives 04/21/2011    ROS:  General: Negative for anorexia, weight loss, fever, chills, fatigue, weakness. ENT: Negative for hoarseness, difficulty swallowing , nasal congestion. CV: Negative for chest pain, angina, palpitations, dyspnea on exertion, peripheral edema.  Respiratory: Negative for dyspnea at rest, dyspnea on exertion, cough, sputum, wheezing.  GI: See history of present illness. GU:  Negative for dysuria, hematuria, urinary incontinence, urinary frequency, nocturnal urination.  Endo: Negative for unusual  weight change.    Physical Examination:   LMP 08/21/2013   General: Well-nourished, well-developed in no acute distress.  Eyes: No icterus. Mouth: Oropharyngeal mucosa moist and pink , no lesions erythema or exudate. Lungs: Clear to auscultation bilaterally.  Heart: Regular rate and rhythm, no murmurs rubs or gallops.  Abdomen: Bowel sounds are normal, nontender, nondistended, no hepatosplenomegaly or masses, no abdominal bruits or hernia , no rebound or guarding.   Extremities: No lower extremity edema. No clubbing or deformities. Neuro: Alert  and oriented x 4   Skin: Warm and dry, no jaundice.   Psych: Alert and cooperative, normal mood and affect.  Labs:  Lab Results  Component Value Date   CREATININE 1.25 (H) 10/29/2019   BUN 15 10/29/2019   NA 134 10/29/2019   K 3.8 10/29/2019   CL 95 (L) 10/29/2019   CO2 21 10/29/2019   Lab Results  Component Value Date   ALT 5 10/29/2019   AST 9 10/29/2019   ALKPHOS 73 10/29/2019   BILITOT 0.5 10/29/2019   Lab Results  Component Value Date   WBC 5.8 10/29/2019   HGB 11.7 10/29/2019   HCT 35.2 10/29/2019   MCV 91 10/29/2019   PLT 365 10/29/2019   Lab Results  Component Value Date   TSH 89.100 (H) 10/29/2019     Imaging Studies: No results found.

## 2019-12-01 ENCOUNTER — Encounter: Payer: Self-pay | Admitting: Family Medicine

## 2019-12-01 ENCOUNTER — Ambulatory Visit (INDEPENDENT_AMBULATORY_CARE_PROVIDER_SITE_OTHER): Payer: Medicare Other | Admitting: Family Medicine

## 2019-12-01 NOTE — Progress Notes (Signed)
Could not reach patient after 4 attempts to call, spread out 5 min apart though appt. Time. LEft messages. Asked her to call and reschedule. WS

## 2020-01-05 ENCOUNTER — Ambulatory Visit: Payer: Medicare Other | Admitting: Family Medicine

## 2020-01-16 ENCOUNTER — Other Ambulatory Visit: Payer: Self-pay

## 2020-01-19 ENCOUNTER — Encounter: Payer: Self-pay | Admitting: Family Medicine

## 2020-01-19 ENCOUNTER — Other Ambulatory Visit: Payer: Self-pay

## 2020-01-19 ENCOUNTER — Ambulatory Visit: Payer: Medicare Other | Admitting: Family Medicine

## 2020-01-19 ENCOUNTER — Ambulatory Visit (INDEPENDENT_AMBULATORY_CARE_PROVIDER_SITE_OTHER): Payer: Medicare HMO | Admitting: Family Medicine

## 2020-01-19 VITALS — BP 184/86 | HR 64 | Temp 96.8°F | Ht 68.0 in | Wt 163.2 lb

## 2020-01-19 DIAGNOSIS — E038 Other specified hypothyroidism: Secondary | ICD-10-CM

## 2020-01-19 DIAGNOSIS — M48062 Spinal stenosis, lumbar region with neurogenic claudication: Secondary | ICD-10-CM

## 2020-01-19 DIAGNOSIS — R7401 Elevation of levels of liver transaminase levels: Secondary | ICD-10-CM | POA: Diagnosis not present

## 2020-01-19 DIAGNOSIS — E782 Mixed hyperlipidemia: Secondary | ICD-10-CM | POA: Diagnosis not present

## 2020-01-19 DIAGNOSIS — R300 Dysuria: Secondary | ICD-10-CM | POA: Diagnosis not present

## 2020-01-19 DIAGNOSIS — F3341 Major depressive disorder, recurrent, in partial remission: Secondary | ICD-10-CM

## 2020-01-19 DIAGNOSIS — I10 Essential (primary) hypertension: Secondary | ICD-10-CM | POA: Diagnosis not present

## 2020-01-19 DIAGNOSIS — E538 Deficiency of other specified B group vitamins: Secondary | ICD-10-CM

## 2020-01-19 DIAGNOSIS — D500 Iron deficiency anemia secondary to blood loss (chronic): Secondary | ICD-10-CM

## 2020-01-19 DIAGNOSIS — N182 Chronic kidney disease, stage 2 (mild): Secondary | ICD-10-CM | POA: Diagnosis not present

## 2020-01-19 DIAGNOSIS — M810 Age-related osteoporosis without current pathological fracture: Secondary | ICD-10-CM

## 2020-01-19 LAB — URINALYSIS, COMPLETE
Bilirubin, UA: NEGATIVE
Glucose, UA: NEGATIVE
Ketones, UA: NEGATIVE
Nitrite, UA: NEGATIVE
Protein,UA: NEGATIVE
Specific Gravity, UA: 1.025 (ref 1.005–1.030)
Urobilinogen, Ur: 0.2 mg/dL (ref 0.2–1.0)
pH, UA: 6 (ref 5.0–7.5)

## 2020-01-19 LAB — MICROSCOPIC EXAMINATION
Epithelial Cells (non renal): >10 /HPF — AB (ref 0–10)
Renal Epithel, UA: NONE SEEN /HPF
WBC, UA: >30 /HPF — AB (ref 0–5)

## 2020-01-19 MED ORDER — OLMESARTAN MEDOXOMIL-HCTZ 40-25 MG PO TABS: 1.0000 | ORAL_TABLET | Freq: Every day | ORAL | 1 refills | Status: DC

## 2020-01-19 MED ORDER — LEVOTHYROXINE SODIUM 137 MCG PO TABS: 137.0000 ug | ORAL_TABLET | Freq: Every day | ORAL | 1 refills | Status: DC

## 2020-01-19 MED ORDER — SULFAMETHOXAZOLE-TRIMETHOPRIM 800-160 MG PO TABS: 1.0000 | ORAL_TABLET | Freq: Two times a day (BID) | ORAL | 0 refills | Status: DC

## 2020-01-19 MED ORDER — LIOTHYRONINE SODIUM 5 MCG PO TABS: 10.0000 ug | ORAL_TABLET | Freq: Every day | ORAL | 1 refills | Status: DC

## 2020-01-19 MED ORDER — NABUMETONE 500 MG PO TABS: 1000.0000 mg | ORAL_TABLET | Freq: Two times a day (BID) | ORAL | 1 refills | Status: DC

## 2020-01-19 MED ORDER — ALENDRONATE SODIUM 70 MG PO TABS: 70.0000 mg | ORAL_TABLET | ORAL | 3 refills | Status: DC

## 2020-01-19 MED ORDER — DULOXETINE HCL 60 MG PO CPEP: 60.0000 mg | ORAL_CAPSULE | Freq: Every day | ORAL | 1 refills | Status: DC

## 2020-01-19 MED ORDER — ROSUVASTATIN CALCIUM 10 MG PO TABS: 10.0000 mg | ORAL_TABLET | Freq: Every day | ORAL | 1 refills | Status: DC

## 2020-01-19 MED ORDER — METOPROLOL SUCCINATE ER 50 MG PO TB24: 50.0000 mg | ORAL_TABLET | Freq: Every day | ORAL | 2 refills | Status: DC

## 2020-01-19 NOTE — Patient Instructions (Signed)
Please check your blood pressure twice daily.  Do this for 1 week.  Then return your readings to the office.  Each time you check your blood pressure, you should do 3 readings back to back about a minute apart.  You should also be sure it is at a time when you are well rested and under no unusual stress.

## 2020-01-19 NOTE — Progress Notes (Signed)
Subjective:  Patient ID: Karenann Cai, female    DOB: 03-13-1946  Age: 74 y.o. MRN: [id number]  CC: Follow-up (6 month)   HPI JACOBI RYANT presents for follow-up of elevated cholesterol. Doing well without complaints on current medication. Denies side effects of statin including myalgia and arthralgia and nausea. Also in today for liver function testing. Currently no chest pain, shortness of breath or other cardiovascular related symptoms noted. burning with urination and frequency for several days. Denies fever . No flank pain. No nausea, vomiting.  Patient was recently hospitalized with urosepsis.  Concerned about recurrence.   Patient notes that her blood pressure is high but she has had abnormal stress this morning for a variety of reasons.  She says her home readings are usually done at rest and are normal.  Patient presents for follow-up on  thyroid. The patient has a history of hypothyroidism for many years. It has been stable recently. Pt. denies any change in  voice, loss of hair, heat or cold intolerance. Energy level has been adequate to good. Patient denies constipation and diarrhea. No myxedema. Medication is as noted below. Verified that pt is taking it daily on an empty stomach. Well tolerated.  Patient has a history of iron deficiency anemia.  She reports that her toes get excessively cold.  She is due to have her iron level checked anyway.  We will start with a hemoglobin.  Depression screen [facility] 2/9 01/19/2020 10/29/2019 08/20/2019 08/01/2019 06/02/2019  Decreased Interest 0 0 0 0 0  Down, Depressed, Hopeless 1 0 0 0 0  PHQ - 2 Score 1 0 0 0 0  Altered sleeping - - - - 0  Tired, decreased energy - - - - 0  Change in appetite - - - - 0  Feeling bad or failure about yourself  - - - - 0  Trouble concentrating - - - - 0  Moving slowly or fidgety/restless - - - - 0  Suicidal thoughts - - - - 0  PHQ-9 Score - - - - 0  Some recent data might be hidden      History Lezlie  has a past medical history of Alcoholism ([place]) (01/09/2012), Anemia, Anxiety, Arthritis, B12 deficiency, Carotid artery narrowing (10/03/2012), Chronic kidney disease (CKD), stage II (mild), COPD (chronic obstructive pulmonary disease) ([place]), Depression, Headache(784.0), Hyperlipidemia, Hypertension, Hyponatremia (01/09/2012), Hypothyroid, Menopause, Mini stroke ([place]), Orthostatic syncope (10/02/2012), Pneumonia, SBO (small bowel obstruction) ([facility]) (06/28/2016), Shortness of breath, and Stroke ([place]) (2010).   She has a past surgical history that includes Appendectomy; Tonsillectomy; Cataract extraction w/PHACO (06/20/2012); Cataract extraction w/PHACO (07/11/2012); Colonoscopy (N/A, 12/17/2015); Esophagogastroduodenoscopy (N/A, 12/17/2015); Agile capsule (N/A, 02/22/2016); Knee arthroscopy (Right, 09/2016); and Lumbar laminectomy/decompression microdiscectomy (N/A, 05/25/2017).   Her family history includes GER disease in her mother; Hypertension in her maternal grandmother; Ulcers in her mother.She reports that she quit smoking about 27 years ago. Her smoking use included cigarettes. She has a 60.00 pack-year smoking history. She has never used smokeless tobacco. She reports that she does not drink alcohol or use drugs.  Current Outpatient Medications on File Prior to Visit  Medication Sig Dispense Refill  . aspirin EC 81 MG tablet Take 81 mg by mouth daily.    . Coenzyme Q10 (CO Q-10) 100 MG CAPS Take 100 mg by mouth daily. 941 each 10  . folic acid (FOLVITE) 740 MCG tablet Take 2,000 mcg by mouth daily.    . ondansetron (ZOFRAN) 4 MG tablet Take 1 tablet (  4 mg total) by mouth every 6 (six) hours as needed for nausea. 20 tablet 0  . thiamine (VITAMIN B-1) 100 MG tablet Take 1 tablet (100 mg total) by mouth daily. 90 tablet 3   No current facility-administered medications on file prior to visit.    ROS Review of Systems  Constitutional: Negative.   HENT: Negative for congestion.   Eyes: Negative for visual  disturbance.  Respiratory: Negative for shortness of breath.   Cardiovascular: Negative for chest pain.  Gastrointestinal: Negative for abdominal pain, constipation, diarrhea, nausea and vomiting.  Genitourinary: Negative for difficulty urinating.  Musculoskeletal: Positive for arthralgias and back pain. Negative for myalgias.       Reports that her toes get very cold.  Neurological: Negative for headaches.  Psychiatric/Behavioral: Negative for sleep disturbance.    Objective:  BP (!) 184/86   Pulse 64   Temp (!) 96.8 F (36 C) (Temporal)   Ht 5' 8"  (1.727 m)   Wt 163 lb 3.2 oz (74 kg)   LMP 08/21/2013   BMI 24.81 kg/m   BP Readings from Last 3 Encounters:  01/19/20 (!) 184/86  10/29/19 (!) 180/91  10/16/19 131/84    Wt Readings from Last 3 Encounters:  01/19/20 163 lb 3.2 oz (74 kg)  10/29/19 164 lb (74.4 kg)  10/14/19 163 lb 2.3 oz (74 kg)     Physical Exam Constitutional:      General: She is not in acute distress.    Appearance: She is well-developed.  HENT:     Head: Normocephalic and atraumatic.  Eyes:     Conjunctiva/sclera: Conjunctivae normal.     Pupils: Pupils are equal, round, and reactive to light.  Neck:     Thyroid: No thyromegaly.  Cardiovascular:     Rate and Rhythm: Normal rate and regular rhythm.     Heart sounds: Normal heart sounds. No murmur.  Pulmonary:     Effort: Pulmonary effort is normal. No respiratory distress.     Breath sounds: Normal breath sounds. No wheezing or rales.  Abdominal:     General: Bowel sounds are normal. There is no distension.     Palpations: Abdomen is soft.     Tenderness: There is no abdominal tenderness.  Musculoskeletal:        General: Normal range of motion.     Cervical back: Normal range of motion and neck supple.  Lymphadenopathy:     Cervical: No cervical adenopathy.  Skin:    General: Skin is warm and dry.  Neurological:     Mental Status: She is alert and oriented to person, place, and time.    Psychiatric:        Behavior: Behavior normal.        Thought Content: Thought content normal.        Judgment: Judgment normal.     Lab Results  Component Value Date   HGBA1C 4.7 11/13/2013   HGBA1C 5.0 10/22/2013    Lab Results  Component Value Date   WBC 5.8 10/29/2019   HGB 11.7 10/29/2019   HCT 35.2 10/29/2019   PLT 365 10/29/2019   GLUCOSE 99 10/29/2019   CHOL 147 02/19/2018   TRIG 142 02/19/2018   HDL 65 02/19/2018   LDLCALC 54 02/19/2018   ALT 5 10/29/2019   AST 9 10/29/2019   NA 134 10/29/2019   K 3.8 10/29/2019   CL 95 (L) 10/29/2019   CREATININE 1.25 (H) 10/29/2019   BUN 15 10/29/2019  CO2 21 10/29/2019   TSH 89.100 (H) 10/29/2019   INR 1.00 05/18/2017   HGBA1C 4.7 11/13/2013    CT ABDOMEN PELVIS W CONTRAST  Result Date: 10/12/2019 CLINICAL DATA:  Abdominal pain. Urinary and bowel incontinence. Emesis. EXAM: CT ABDOMEN AND PELVIS WITH CONTRAST TECHNIQUE: Multidetector CT imaging of the abdomen and pelvis was performed using the standard protocol following bolus administration of intravenous contrast. CONTRAST:  22m OMNIPAQUE IOHEXOL 300 MG/ML  SOLN COMPARISON:  06/28/2016 FINDINGS: Lower chest: Choose 1 Hepatobiliary: Liver normal in size and overall attenuation. 11 mm low-attenuation lesion in right lobe, segment 7, not well-defined on the prior CT that was performed without contrast. This is likely a hemangioma. No other liver masses or lesions. Small gallstones suggested dependently a mildly distended gallbladder. No wall thickening or inflammation. No bile duct dilation. Pancreas: Unremarkable. No pancreatic ductal dilatation or surrounding inflammatory changes. Spleen: Prominent spleen, 12 cm in long axis increased from 10 cm on the prior CT. No splenic mass or focal lesion. Adrenals/Urinary Tract: No adrenal masses. Bilateral renal cortical thinning. There are multiple areas of relative decreased renal enhancement bilaterally, most evident on the delayed  sequence. No defined renal masses, no stones and no hydronephrosis. Mild stranding in the perinephric spaces, without a perinephric fluid collection. Ureters are normal in course and in caliber. Bladder is unremarkable. Stomach/Bowel: Unremarkable stomach. Colon and small bowel are normal in caliber. Mild wall thickening of the terminal ileum. No other small bowel wall thickening. There is mild wall prominence at the ileocecal valve, in the right colon. Remaining portions of the colon shows normal wall thickness. No mesenteric inflammation. Vascular/Lymphatic: Aortic atherosclerosis. No aneurysm. There is plaque at the origin of the renal arteries, but no definite hemodynamically significant stenosis. No enlarged lymph nodes. Reproductive: Uterus and bilateral adnexa are unremarkable. Other: No abdominal wall hernia or abnormality. No abdominopelvic ascites. Musculoskeletal: Compression fractures of T12 and L1. Slight depression of the upper endplate of L2 and lower endplate of L3. Small node along the upper endplate of L4. The compression fracture of L1 has increased in severity from the prior CT. The compression fracture of T12 is new. These do not appear acute or recent, however. No other fractures. No osteoblastic or osteolytic bone lesions. IMPRESSION: 1. Multiple areas of decreased enhancement in the kidneys, best seen on the delayed sequence. This is consistent with bilateral pyelonephritis in the proper clinical setting. No evidence of a renal or perinephric abscess. No hydronephrosis. 2. Mild wall thickening of the terminal ileum and right colon at the ileocecal valve, without adjacent inflammation. Consider infectious or inflammatory ileitis. 3. No other evidence of an acute abnormality. 4. Aortic atherosclerosis. 5. Liver lesion that is most likely a hemangioma. 6. Spleen appears borderline enlarged and larger than it was previously. Etiology of this is unclear. Electronically Signed   By: DLajean Manes M.D.   On: 10/12/2019 19:28   DG Chest Portable 1 View  Result Date: 10/12/2019 CLINICAL DATA:  Shortness of breath EXAM: PORTABLE CHEST 1 VIEW COMPARISON:  Portable exam 2110 hours compared to 1554 hours FINDINGS: Normal heart size, mediastinal contours, and pulmonary vascularity. Atherosclerotic calcification aorta. Biapical scarring. Lungs otherwise clear. No acute infiltrate, pleural effusion or pneumothorax. Bones unremarkable. IMPRESSION: No acute abnormalities. Electronically Signed   By: MLavonia DanaM.D.   On: 10/12/2019 21:32   DG Chest Portable 1 View  Result Date: 10/12/2019 CLINICAL DATA:  Fever, abdominal pain. EXAM: PORTABLE CHEST 1 VIEW COMPARISON:  Chest x-rays  dated 05/18/2017 and 10/18/2016. FINDINGS: Heart size and mediastinal contours are within normal limits. Biapical pleuroparenchymal scarring/fibrosis. Lungs otherwise clear. No confluent opacity to suggest a developing pneumonia. No pleural effusion. Osseous structures about the chest are unremarkable. IMPRESSION: No active cardiopulmonary disease. No evidence of pneumonia or pulmonary edema. Electronically Signed   By: Franki Cabot M.D.   On: 10/12/2019 16:14    Assessment & Plan:   Ellarae was seen today for follow-up.  Diagnoses and all orders for this visit:  Essential hypertension -     olmesartan-hydrochlorothiazide (BENICAR HCT) 40-25 MG tablet; Take 1 tablet by mouth daily. -     CBC with Differential/Platelet -     CMP14+EGFR  Accelerated hypertension -     olmesartan-hydrochlorothiazide (BENICAR HCT) 40-25 MG tablet; Take 1 tablet by mouth daily. -     metoprolol succinate (TOPROL-XL) 50 MG 24 hr tablet; Take 1 tablet (50 mg total) by mouth daily. For blood pressure control -     CBC with Differential/Platelet -     CMP14+EGFR  Mixed hyperlipidemia -     rosuvastatin (CRESTOR) 10 MG tablet; Take 1 tablet (10 mg total) by mouth daily. -     CBC with Differential/Platelet -     CMP14+EGFR -     Lipid  panel  B12 deficiency -     CBC with Differential/Platelet -     CMP14+EGFR  Other specified hypothyroidism -     liothyronine (CYTOMEL) 5 MCG tablet; Take 2 tablets (10 mcg total) by mouth daily. -     levothyroxine (SYNTHROID) 137 MCG tablet; Take 1 tablet (137 mcg total) by mouth daily. -     CBC with Differential/Platelet -     CMP14+EGFR -     TSH + free T4  Chronic kidney disease (CKD), stage II (mild) -     CBC with Differential/Platelet -     CMP14+EGFR  Iron deficiency anemia due to chronic blood loss -     CBC with Differential/Platelet -     CMP14+EGFR  Transaminitis -     CBC with Differential/Platelet -     CMP14+EGFR  Spinal stenosis, lumbar region with neurogenic claudication -     nabumetone (RELAFEN) 500 MG tablet; Take 2 tablets (1,000 mg total) by mouth 2 (two) times daily. For muscle and joint pain -     CBC with Differential/Platelet -     CMP14+EGFR  Dysuria -     CBC with Differential/Platelet -     CMP14+EGFR -     Urine Culture -     Urinalysis, Complete  Age-related osteoporosis without current pathological fracture -     alendronate (FOSAMAX) 70 MG tablet; Take 1 tablet (70 mg total) by mouth once a week. Take with a full glass of water on an empty stomach. -     CBC with Differential/Platelet -     CMP14+EGFR  Recurrent major depressive disorder, in partial remission ([place])  Other orders -     DULoxetine (CYMBALTA) 60 MG capsule; Take 1 capsule (60 mg total) by mouth daily. -     Discontinue: sulfamethoxazole-trimethoprim (BACTRIM DS) 800-160 MG tablet; Take 1 tablet by mouth 2 (two) times daily. -     sulfamethoxazole-trimethoprim (BACTRIM DS) 800-160 MG tablet; Take 1 tablet by mouth 2 (two) times daily. -     Microscopic Examination   I have discontinued [name]. Price's levofloxacin and sulfamethoxazole-trimethoprim. I have also changed her rosuvastatin and alendronate. Additionally, I am  having her start on  sulfamethoxazole-trimethoprim. Lastly, I am having her maintain her aspirin EC, thiamine, Co B-63, folic acid, ondansetron, olmesartan-hydrochlorothiazide, nabumetone, metoprolol succinate, liothyronine, levothyroxine, and DULoxetine.  Meds ordered this encounter  Medications  . rosuvastatin (CRESTOR) 10 MG tablet    Sig: Take 1 tablet (10 mg total) by mouth daily.    Dispense:  90 tablet    Refill:  1  . olmesartan-hydrochlorothiazide (BENICAR HCT) 40-25 MG tablet    Sig: Take 1 tablet by mouth daily.    Dispense:  90 tablet    Refill:  1  . nabumetone (RELAFEN) 500 MG tablet    Sig: Take 2 tablets (1,000 mg total) by mouth 2 (two) times daily. For muscle and joint pain    Dispense:  360 tablet    Refill:  1  . metoprolol succinate (TOPROL-XL) 50 MG 24 hr tablet    Sig: Take 1 tablet (50 mg total) by mouth daily. For blood pressure control    Dispense:  30 tablet    Refill:  2  . liothyronine (CYTOMEL) 5 MCG tablet    Sig: Take 2 tablets (10 mcg total) by mouth daily.    Dispense:  180 tablet    Refill:  1  . levothyroxine (SYNTHROID) 137 MCG tablet    Sig: Take 1 tablet (137 mcg total) by mouth daily.    Dispense:  30 tablet    Refill:  1  . DULoxetine (CYMBALTA) 60 MG capsule    Sig: Take 1 capsule (60 mg total) by mouth daily.    Dispense:  90 capsule    Refill:  1  . alendronate (FOSAMAX) 70 MG tablet    Sig: Take 1 tablet (70 mg total) by mouth once a week. Take with a full glass of water on an empty stomach.    Dispense:  13 tablet    Refill:  3  . DISCONTD: sulfamethoxazole-trimethoprim (BACTRIM DS) 800-160 MG tablet    Sig: Take 1 tablet by mouth 2 (two) times daily.    Dispense:  14 tablet    Refill:  0  . sulfamethoxazole-trimethoprim (BACTRIM DS) 800-160 MG tablet    Sig: Take 1 tablet by mouth 2 (two) times daily.    Dispense:  14 tablet    Refill:  0     Follow-up: Return in about 6 months (around 07/18/2020), or if symptoms worsen or fail to  improve.  Claretta Fraise, M.D.

## 2020-01-20 ENCOUNTER — Other Ambulatory Visit: Payer: Self-pay

## 2020-01-20 ENCOUNTER — Other Ambulatory Visit: Payer: Self-pay | Admitting: Family Medicine

## 2020-01-20 DIAGNOSIS — E038 Other specified hypothyroidism: Secondary | ICD-10-CM

## 2020-01-20 LAB — CMP14+EGFR
ALT: 5 IU/L (ref 0–32)
AST: 10 IU/L (ref 0–40)
Albumin/Globulin Ratio: 1.7 (ref 1.2–2.2)
Albumin: 4.5 g/dL (ref 3.7–4.7)
Alkaline Phosphatase: 102 IU/L (ref 39–117)
BUN/Creatinine Ratio: 13 (ref 12–28)
BUN: 18 mg/dL (ref 8–27)
Bilirubin Total: 0.3 mg/dL (ref 0.0–1.2)
CO2: 24 mmol/L (ref 20–29)
Calcium: 9.5 mg/dL (ref 8.7–10.3)
Chloride: 97 mmol/L (ref 96–106)
Creatinine, Ser: 1.37 mg/dL — ABNORMAL HIGH (ref 0.57–1.00)
GFR calc Af Amer: 44 mL/min/{1.73_m2} — ABNORMAL LOW (ref >59)
GFR calc non Af Amer: 39 mL/min/{1.73_m2} — ABNORMAL LOW (ref >59)
Globulin, Total: 2.7 g/dL (ref 1.5–4.5)
Glucose: 75 mg/dL (ref 65–99)
Potassium: 3.8 mmol/L (ref 3.5–5.2)
Sodium: 137 mmol/L (ref 134–144)
Total Protein: 7.2 g/dL (ref 6.0–8.5)

## 2020-01-20 LAB — CBC WITH DIFFERENTIAL/PLATELET
Basophils Absolute: 0 10*3/uL (ref 0.0–0.2)
Basos: 1 %
EOS (ABSOLUTE): 0.2 10*3/uL (ref 0.0–0.4)
Eos: 2 %
Hematocrit: 35.3 % (ref 34.0–46.6)
Hemoglobin: 11.6 g/dL (ref 11.1–15.9)
Immature Grans (Abs): 0 10*3/uL (ref 0.0–0.1)
Immature Granulocytes: 1 %
Lymphocytes Absolute: 1.6 10*3/uL (ref 0.7–3.1)
Lymphs: 25 %
MCH: 30.9 pg (ref 26.6–33.0)
MCHC: 32.9 g/dL (ref 31.5–35.7)
MCV: 94 fL (ref 79–97)
Monocytes Absolute: 0.6 10*3/uL (ref 0.1–0.9)
Monocytes: 9 %
Neutrophils Absolute: 4.2 10*3/uL (ref 1.4–7.0)
Neutrophils: 62 %
Platelets: 198 10*3/uL (ref 150–450)
RBC: 3.76 x10E6/uL — ABNORMAL LOW (ref 3.77–5.28)
RDW: 13.7 % (ref 11.7–15.4)
WBC: 6.7 10*3/uL (ref 3.4–10.8)

## 2020-01-20 LAB — TSH+FREE T4
Free T4: 1.3 ng/dL (ref 0.82–1.77)
TSH: 20.4 u[IU]/mL — ABNORMAL HIGH (ref 0.450–4.500)

## 2020-01-20 LAB — LIPID PANEL
Chol/HDL Ratio: 2.3 ratio (ref 0.0–4.4)
Cholesterol, Total: 162 mg/dL (ref 100–199)
HDL: 70 mg/dL (ref >39)
LDL Chol Calc (NIH): 71 mg/dL (ref 0–99)
Triglycerides: 121 mg/dL (ref 0–149)
VLDL Cholesterol Cal: 21 mg/dL (ref 5–40)

## 2020-01-20 MED ORDER — LEVOTHYROXINE SODIUM 150 MCG PO TABS: 150.0000 ug | ORAL_TABLET | Freq: Every day | ORAL | 0 refills | Status: DC

## 2020-01-21 ENCOUNTER — Other Ambulatory Visit: Payer: Self-pay | Admitting: Family Medicine

## 2020-01-21 ENCOUNTER — Telehealth: Payer: Self-pay

## 2020-01-21 LAB — URINE CULTURE

## 2020-01-21 MED ORDER — AMOXICILLIN 500 MG PO CAPS: 500.0000 mg | ORAL_CAPSULE | Freq: Three times a day (TID) | ORAL | 0 refills | Status: DC

## 2020-01-21 NOTE — Telephone Encounter (Signed)
Patient presented to office to make sure that her BP monitor was operating properly. Her reading with her machine was 169/87 with a pulse of 74. Our office machine had a reading of 161/77 with a pulse of 73. Patient will continue to monitor BPs at home and report back to you in 1 week.

## 2020-02-16 ENCOUNTER — Telehealth: Payer: Self-pay | Admitting: Family Medicine

## 2020-02-16 NOTE — Chronic Care Management (AMB) (Signed)
  Care Management   Note  02/16/2020 Name: GALILEAH DUNLAVEY MRN: [id number] DOB: 1946-03-25  BASILIA TAYLOR is a 74 y.o. year old female who is a primary care patient of Stacks, Cletus Gash, MD and is actively engaged with the care management team. I reached out to Karenann Cai by phone today to assist with scheduling a follow up visit with the RN Case Manager  Follow up plan: Unsuccessful telephone outreach attempt made patient is currently living at [facility] per note in chart. A HIPPA compliant phone message was left for the patient providing contact information and requesting a return call.  The patient has been provided with contact information for the care management team and has been advised to call with any health related questions or concerns.   [place], [place] [address] Direct Dial: [phone] [email or web address]

## 2020-02-18 ENCOUNTER — Other Ambulatory Visit: Payer: Self-pay | Admitting: *Deleted

## 2020-02-18 DIAGNOSIS — E038 Other specified hypothyroidism: Secondary | ICD-10-CM

## 2020-02-19 ENCOUNTER — Ambulatory Visit: Payer: Medicare HMO | Attending: Internal Medicine

## 2020-02-19 DIAGNOSIS — Z23 Encounter for immunization: Secondary | ICD-10-CM

## 2020-02-19 NOTE — Progress Notes (Signed)
   Covid-19 Vaccination Clinic  Name:  [name]    MRN: [id number] DOB: 23-Jan-1946  02/19/2020  [name] was observed post Covid-19 immunization for 15 minutes without incident. She was provided with Vaccine Information Sheet and instruction to access the V-Safe system.   [name] was instructed to call 911 with any severe reactions post vaccine: Marland Kitchen Difficulty breathing  . Swelling of face and throat  . A fast heartbeat  . A bad rash all over body  . Dizziness and weakness   Immunizations Administered    Name Date Dose VIS Date Route   Moderna COVID-19 Vaccine 02/19/2020  8:55 AM 0.5 mL 11/11/2019 Intramuscular   Manufacturer: Moderna   Lot: [id number]   [id number]

## 2020-03-01 ENCOUNTER — Telehealth: Payer: Self-pay | Admitting: Family Medicine

## 2020-03-01 DIAGNOSIS — E782 Mixed hyperlipidemia: Secondary | ICD-10-CM

## 2020-03-01 NOTE — Telephone Encounter (Signed)
Talked about Thyroid dosage. Pt is to be taking 150 mcg, she will stay on this until her appt on Wednesday.

## 2020-03-01 NOTE — Telephone Encounter (Signed)
  Medication Request  03/01/2020  What is the name of the medication. Med for her cholestrol did not say it was levothyroxine  Have you contacted your pharmacy to request a refill? no  Which [facility] you [facility]   Patient notified that their request is being sent to the clinical staff for review and that they should receive a call once it is complete. If they do not receive a call within 24 hours they can check with their pharmacy or our office.

## 2020-03-03 ENCOUNTER — Other Ambulatory Visit: Payer: Self-pay

## 2020-03-03 ENCOUNTER — Encounter: Payer: Self-pay | Admitting: Family Medicine

## 2020-03-03 ENCOUNTER — Ambulatory Visit (INDEPENDENT_AMBULATORY_CARE_PROVIDER_SITE_OTHER): Payer: Medicare HMO | Admitting: Family Medicine

## 2020-03-03 VITALS — BP 130/87 | HR 78 | Temp 97.8°F | Ht 68.0 in | Wt 161.0 lb

## 2020-03-03 DIAGNOSIS — I10 Essential (primary) hypertension: Secondary | ICD-10-CM

## 2020-03-03 DIAGNOSIS — E038 Other specified hypothyroidism: Secondary | ICD-10-CM

## 2020-03-03 NOTE — Progress Notes (Signed)
Subjective:  Patient ID: [name], female    DOB: November 22, 1946  Age: 74 y.o. MRN: [id number]  CC: Follow-up   HPI [name] presents for  follow-up of hypertension. Patient has no history of headache chest pain or shortness of breath or recent cough. Patient also denies symptoms of TIA such as focal numbness or weakness. Patient denies side effects from medication. States taking it regularly.  She feels much better since we changed her blood pressure medicine and increased her thyroid medication.  She is in today for repeat thyroid blood work.   History [name] has a past medical history of Alcoholism ([place]) (01/09/2012), Anemia, Anxiety, Arthritis, B12 deficiency, Carotid artery narrowing (10/03/2012), Chronic kidney disease (CKD), stage II (mild), COPD (chronic obstructive pulmonary disease) ([place]), Depression, Headache(784.0), Hyperlipidemia, Hypertension, Hyponatremia (01/09/2012), Hypothyroid, Menopause, Mini stroke ([place]), Orthostatic syncope (10/02/2012), Pneumonia, SBO (small bowel obstruction) ([place]) (06/28/2016), Shortness of breath, and Stroke ([place]) (2010).   She has a past surgical history that includes Appendectomy; Tonsillectomy; Cataract extraction w/PHACO (06/20/2012); Cataract extraction w/PHACO (07/11/2012); Colonoscopy (N/A, 12/17/2015); Esophagogastroduodenoscopy (N/A, 12/17/2015); Agile capsule (N/A, 02/22/2016); Knee arthroscopy (Right, 09/2016); and Lumbar laminectomy/decompression microdiscectomy (N/A, 05/25/2017).   Her family history includes GER disease in her mother; Hypertension in her maternal grandmother; Ulcers in her mother.She reports that she quit smoking about 27 years ago. Her smoking use included cigarettes. She has a 60.00 pack-year smoking history. She has never used smokeless tobacco. She reports that she does not drink alcohol or use drugs.  Current Outpatient Medications on File Prior to Visit  Medication Sig Dispense Refill  . alendronate (FOSAMAX) 70 MG  tablet Take 1 tablet (70 mg total) by mouth once a week. Take with a full glass of water on an empty stomach. 13 tablet 3  . aspirin EC 81 MG tablet Take 81 mg by mouth daily.    . Coenzyme Q10 (CO Q-10) 100 MG CAPS Take 100 mg by mouth daily. 100 each 10  . DULoxetine (CYMBALTA) 60 MG capsule Take 1 capsule (60 mg total) by mouth daily. 90 capsule 1  . folic acid (FOLVITE) Q000111Q MCG tablet Take 2,000 mcg by mouth daily.    Marland Kitchen levothyroxine (SYNTHROID) 150 MCG tablet Take 1 tablet (150 mcg total) by mouth daily. 90 tablet 0  . liothyronine (CYTOMEL) 5 MCG tablet Take 2 tablets (10 mcg total) by mouth daily. 180 tablet 1  . metoprolol succinate (TOPROL-XL) 50 MG 24 hr tablet Take 1 tablet (50 mg total) by mouth daily. For blood pressure control 30 tablet 2  . nabumetone (RELAFEN) 500 MG tablet Take 2 tablets (1,000 mg total) by mouth 2 (two) times daily. For muscle and joint pain 360 tablet 1  . olmesartan-hydrochlorothiazide (BENICAR HCT) 40-25 MG tablet Take 1 tablet by mouth daily. 90 tablet 1  . rosuvastatin (CRESTOR) 10 MG tablet Take 1 tablet (10 mg total) by mouth daily. 90 tablet 1  . thiamine (VITAMIN B-1) 100 MG tablet Take 1 tablet (100 mg total) by mouth daily. 90 tablet 3   No current facility-administered medications on file prior to visit.    ROS Review of Systems  Constitutional: Negative.   HENT: Negative.   Eyes: Negative for visual disturbance.  Respiratory: Negative for shortness of breath.   Cardiovascular: Negative for chest pain.  Gastrointestinal: Negative for abdominal pain.  Musculoskeletal: Negative for arthralgias.    Objective:  BP 130/87   Pulse 78   Temp 97.8 F (36.6 C) (Temporal)  Ht 5\' 8"  (1.727 m)   Wt 161 lb (73 kg)   LMP 08/21/2013   BMI 24.48 kg/m   BP Readings from Last 3 Encounters:  03/03/20 130/87  01/19/20 (!) 184/86  10/29/19 (!) 180/91    Wt Readings from Last 3 Encounters:  03/03/20 161 lb (73 kg)  01/19/20 163 lb 3.2 oz (74 kg)   10/29/19 164 lb (74.4 kg)     Physical Exam Constitutional:      General: She is not in acute distress.    Appearance: She is well-developed.  Cardiovascular:     Rate and Rhythm: Normal rate and regular rhythm.  Pulmonary:     Breath sounds: Normal breath sounds.  Skin:    General: Skin is warm and dry.  Neurological:     Mental Status: She is alert and oriented to person, place, and time.       Assessment & Plan:   Tamaya was seen today for follow-up.  Diagnoses and all orders for this visit:  Essential hypertension  Other specified hypothyroidism -     T4, Free -     TSH   Allergies as of 03/03/2020      Reactions   Ciprofloxacin Other (See Comments)   Caused pt to have delusional thoughts and hallucinations   Procardia [nifedipine] Swelling   Edema    Norvasc [amlodipine Besylate] Hives      Medication List       Accurate as of March 03, 2020 11:07 PM. If you have any questions, ask your nurse or doctor.        STOP taking these medications   amoxicillin 500 MG capsule Commonly known as: AMOXIL Stopped by: Claretta Fraise, MD   ondansetron 4 MG tablet Commonly known as: ZOFRAN Stopped by: Claretta Fraise, MD     TAKE these medications   alendronate 70 MG tablet Commonly known as: FOSAMAX Take 1 tablet (70 mg total) by mouth once a week. Take with a full glass of water on an empty stomach.   aspirin EC 81 MG tablet Take 81 mg by mouth daily.   Co Q-10 100 MG Caps Take 100 mg by mouth daily.   DULoxetine 60 MG capsule Commonly known as: Cymbalta Take 1 capsule (60 mg total) by mouth daily.   folic acid Q000111Q MCG tablet Commonly known as: FOLVITE Take 2,000 mcg by mouth daily.   levothyroxine 150 MCG tablet Commonly known as: SYNTHROID Take 1 tablet (150 mcg total) by mouth daily.   liothyronine 5 MCG tablet Commonly known as: Cytomel Take 2 tablets (10 mcg total) by mouth daily.   metoprolol succinate 50 MG 24 hr tablet Commonly  known as: TOPROL-XL Take 1 tablet (50 mg total) by mouth daily. For blood pressure control   nabumetone 500 MG tablet Commonly known as: RELAFEN Take 2 tablets (1,000 mg total) by mouth 2 (two) times daily. For muscle and joint pain   olmesartan-hydrochlorothiazide 40-25 MG tablet Commonly known as: Benicar HCT Take 1 tablet by mouth daily.   rosuvastatin 10 MG tablet Commonly known as: CRESTOR Take 1 tablet (10 mg total) by mouth daily.   thiamine 100 MG tablet Commonly known as: VITAMIN B-1 Take 1 tablet (100 mg total) by mouth daily.       No orders of the defined types were placed in this encounter.   Patient is currently stable and improving with her current regimen.  Follow-up: Return in about 6 months (around 09/03/2020).  Claretta Fraise,  M.D. 

## 2020-03-04 LAB — T4, FREE: Free T4: 2.09 ng/dL — ABNORMAL HIGH (ref 0.82–1.77)

## 2020-03-04 LAB — TSH: TSH: 1.16 u[IU]/mL (ref 0.450–4.500)

## 2020-03-08 ENCOUNTER — Other Ambulatory Visit: Payer: Self-pay | Admitting: *Deleted

## 2020-03-08 DIAGNOSIS — E038 Other specified hypothyroidism: Secondary | ICD-10-CM

## 2020-03-18 ENCOUNTER — Other Ambulatory Visit: Payer: Self-pay | Admitting: Family Medicine

## 2020-03-18 DIAGNOSIS — E038 Other specified hypothyroidism: Secondary | ICD-10-CM

## 2020-03-24 ENCOUNTER — Ambulatory Visit: Payer: Medicare HMO | Attending: Internal Medicine

## 2020-03-24 DIAGNOSIS — Z23 Encounter for immunization: Secondary | ICD-10-CM

## 2020-03-24 NOTE — Progress Notes (Signed)
   Covid-19 Vaccination Clinic  Name:  [name]    MRN: [id number] DOB: Jul 20, 1946  03/24/2020  [name] was observed post Covid-19 immunization for 15 minutes without incident. She was provided with Vaccine Information Sheet and instruction to access the V-Safe system.   [name] was instructed to call 911 with any severe reactions post vaccine: Marland Kitchen Difficulty breathing  . Swelling of face and throat  . A fast heartbeat  . A bad rash all over body  . Dizziness and weakness   Immunizations Administered    Name Date Dose VIS Date Route   Moderna COVID-19 Vaccine 03/24/2020  8:18 AM 0.5 mL 11/11/2019 Intramuscular   Manufacturer: Moderna   Lot: [id number]   [id number]

## 2020-04-20 ENCOUNTER — Ambulatory Visit: Payer: Medicare HMO | Admitting: Family Medicine

## 2020-04-26 ENCOUNTER — Ambulatory Visit (INDEPENDENT_AMBULATORY_CARE_PROVIDER_SITE_OTHER): Payer: Medicare HMO | Admitting: Family Medicine

## 2020-04-26 ENCOUNTER — Other Ambulatory Visit: Payer: Self-pay

## 2020-04-26 ENCOUNTER — Encounter: Payer: Self-pay | Admitting: Family Medicine

## 2020-04-26 VITALS — BP 132/83 | HR 72 | Temp 98.0°F | Ht 68.0 in | Wt 164.0 lb

## 2020-04-26 DIAGNOSIS — M5416 Radiculopathy, lumbar region: Secondary | ICD-10-CM | POA: Diagnosis not present

## 2020-04-26 DIAGNOSIS — M1710 Unilateral primary osteoarthritis, unspecified knee: Secondary | ICD-10-CM | POA: Diagnosis not present

## 2020-04-26 NOTE — Progress Notes (Signed)
Subjective:  Patient ID: [name], female    DOB: 1946/01/17  Age: 74 y.o. MRN: [id number]  CC: Referral (Ortho)   HPI WALAA RIX presents for having terrible back pain.  She is also having increasing pain in the knee on the left with standing.  Once the left knee starts hurting she will favor it and then the right knee starts to hurt as well.  She says the left lower extremity is in pain all the way down.  She describes back pain is 5-7/10 aching.  The knees are 5/10 aching.  She says that she is unable to do chores such as cleaning house because of the pain and debility.  The left knee cramps at night interrupting sleep.  Depression screen [facility] 2/9 04/26/2020 03/03/2020 01/19/2020  Decreased Interest 0 0 0  Down, Depressed, Hopeless 0 0 1  PHQ - 2 Score 0 0 1  Altered sleeping - - -  Tired, decreased energy - - -  Change in appetite - - -  Feeling bad or failure about yourself  - - -  Trouble concentrating - - -  Moving slowly or fidgety/restless - - -  Suicidal thoughts - - -  PHQ-9 Score - - -  Some recent data might be hidden    History Aurion has a past medical history of Alcoholism ([place]) (01/09/2012), Anemia, Anxiety, Arthritis, B12 deficiency, Carotid artery narrowing (10/03/2012), Chronic kidney disease (CKD), stage II (mild), COPD (chronic obstructive pulmonary disease) ([place]), Depression, Headache(784.0), Hyperlipidemia, Hypertension, Hyponatremia (01/09/2012), Hypothyroid, Menopause, Mini stroke ([place]), Orthostatic syncope (10/02/2012), Pneumonia, SBO (small bowel obstruction) ([place]) (06/28/2016), Shortness of breath, and Stroke ([place]) (2010).   She has a past surgical history that includes Appendectomy; Tonsillectomy; Cataract extraction w/PHACO (06/20/2012); Cataract extraction w/PHACO (07/11/2012); Colonoscopy (N/A, 12/17/2015); Esophagogastroduodenoscopy (N/A, 12/17/2015); Agile capsule (N/A, 02/22/2016); Knee arthroscopy (Right, 09/2016); and Lumbar laminectomy/decompression  microdiscectomy (N/A, 05/25/2017).   Her family history includes GER disease in her mother; Hypertension in her maternal grandmother; Ulcers in her mother.She reports that she quit smoking about 27 years ago. Her smoking use included cigarettes. She has a 60.00 pack-year smoking history. She has never used smokeless tobacco. She reports that she does not drink alcohol or use drugs.    ROS Review of Systems  Constitutional: Positive for activity change and fatigue.  Cardiovascular: Negative.   Musculoskeletal: Positive for arthralgias, back pain, gait problem and myalgias.    Objective:  BP 132/83   Pulse 72   Temp 98 F (36.7 C) (Temporal)   Ht 5\' 8"  (1.727 m)   Wt 164 lb (74.4 kg)   LMP 08/21/2013   BMI 24.94 kg/m   BP Readings from Last 3 Encounters:  04/26/20 132/83  03/03/20 130/87  01/19/20 (!) 184/86    Wt Readings from Last 3 Encounters:  04/26/20 164 lb (74.4 kg)  03/03/20 161 lb (73 kg)  01/19/20 163 lb 3.2 oz (74 kg)     Physical Exam Constitutional:      General: She is not in acute distress.    Appearance: She is well-developed.  Cardiovascular:     Rate and Rhythm: Normal rate and regular rhythm.  Pulmonary:     Breath sounds: Normal breath sounds.  Musculoskeletal:        General: Tenderness (along anterior joint line right knee.  Negative McMurray and Lachman bilaterally) present.     Comments: Spinal flexion is painful after 15 degrees  Skin:    General: Skin is warm  and dry.  Neurological:     Mental Status: She is alert and oriented to person, place, and time.       Assessment & Plan:   Adlynn was seen today for referral.  Diagnoses and all orders for this visit:  Lumbar radiculopathy Comments: left sided Orders: -     Ambulatory referral to Neurosurgery -     [place]; Future  Arthritis of knee -     Ambulatory referral to Orthopedics  Other orders -     predniSONE (DELTASONE) 10 MG tablet; Take 5 daily for  3 days followed by 4,3,2 and 1 for 3 days each.       I am having [name]. Price start on predniSONE. I am also having her maintain her aspirin EC, thiamine, Co 0000000, folic acid, rosuvastatin, olmesartan-hydrochlorothiazide, nabumetone, metoprolol succinate, liothyronine, DULoxetine, alendronate, and levothyroxine.  Allergies as of 04/26/2020      Reactions   Ciprofloxacin Other (See Comments)   Caused pt to have delusional thoughts and hallucinations   Procardia [nifedipine] Swelling   Edema    Norvasc [amlodipine Besylate] Hives      Medication List       Accurate as of Apr 26, 2020 11:59 PM. If you have any questions, ask your nurse or doctor.        alendronate 70 MG tablet Commonly known as: FOSAMAX Take 1 tablet (70 mg total) by mouth once a week. Take with a full glass of water on an empty stomach.   aspirin EC 81 MG tablet Take 81 mg by mouth daily.   Co Q-10 100 MG Caps Take 100 mg by mouth daily.   DULoxetine 60 MG capsule Commonly known as: Cymbalta Take 1 capsule (60 mg total) by mouth daily.   folic acid Q000111Q MCG tablet Commonly known as: FOLVITE Take 2,000 mcg by mouth daily.   levothyroxine 150 MCG tablet Commonly known as: SYNTHROID TAKE 1 TABLET EVERY DAY   liothyronine 5 MCG tablet Commonly known as: Cytomel Take 2 tablets (10 mcg total) by mouth daily.   metoprolol succinate 50 MG 24 hr tablet Commonly known as: TOPROL-XL Take 1 tablet (50 mg total) by mouth daily. For blood pressure control   nabumetone 500 MG tablet Commonly known as: RELAFEN Take 2 tablets (1,000 mg total) by mouth 2 (two) times daily. For muscle and joint pain   olmesartan-hydrochlorothiazide 40-25 MG tablet Commonly known as: Benicar HCT Take 1 tablet by mouth daily.   predniSONE 10 MG tablet Commonly known as: DELTASONE Take 5 daily for 3 days followed by 4,3,2 and 1 for 3 days each. Started by: Claretta Fraise, MD   rosuvastatin 10 MG tablet Commonly known  as: CRESTOR Take 1 tablet (10 mg total) by mouth daily.   thiamine 100 MG tablet Commonly known as: Vitamin B-1 Take 1 tablet (100 mg total) by mouth daily.        Follow-up: Return if symptoms worsen or fail to improve.  Claretta Fraise, M.D.

## 2020-04-27 ENCOUNTER — Telehealth: Payer: Self-pay | Admitting: Family Medicine

## 2020-04-27 ENCOUNTER — Encounter: Payer: Self-pay | Admitting: Family Medicine

## 2020-04-27 MED ORDER — PREDNISONE 10 MG PO TABS: ORAL_TABLET | ORAL | 0 refills | Status: DC

## 2020-04-27 NOTE — Telephone Encounter (Signed)
I sent the scrip for prednisone.

## 2020-04-27 NOTE — Telephone Encounter (Signed)
Pt calling about message earlier. Pt Aware that message was sent to provide waiting for him to address.

## 2020-04-27 NOTE — Telephone Encounter (Signed)
noted 

## 2020-04-27 NOTE — Telephone Encounter (Signed)
Patient aware and verbalized understanding. °

## 2020-04-28 ENCOUNTER — Telehealth: Payer: Self-pay | Admitting: Family Medicine

## 2020-04-28 ENCOUNTER — Other Ambulatory Visit: Payer: Self-pay

## 2020-04-28 MED ORDER — PREDNISONE 10 MG PO TABS: ORAL_TABLET | ORAL | 0 refills | Status: DC

## 2020-04-28 NOTE — Telephone Encounter (Signed)
Patient informed that Prednisone has been resent to [facility] in [place]. It has been cancelled with her [employer].

## 2020-04-28 NOTE — Telephone Encounter (Signed)
Pt called regarding the Rx that was sent in by Dr Livia Snellen for Prednisone. Pt says when she spoke to Dr Livia Snellen about this, she was told that he wanted to send Rx to [facility] in [place] that way she didn't have to wait to receive it from [facility] [employer]. But then she found out that Rx was sent to [facility] anyway and shes still waiting to get it. Can Rx be sent to [facility] in [place] so that pt can pick up today? Wants to be called back asap.

## 2020-04-30 DIAGNOSIS — S32010G Wedge compression fracture of first lumbar vertebra, subsequent encounter for fracture with delayed healing: Secondary | ICD-10-CM | POA: Diagnosis not present

## 2020-04-30 DIAGNOSIS — M5416 Radiculopathy, lumbar region: Secondary | ICD-10-CM | POA: Diagnosis not present

## 2020-04-30 DIAGNOSIS — S22080G Wedge compression fracture of T11-T12 vertebra, subsequent encounter for fracture with delayed healing: Secondary | ICD-10-CM | POA: Diagnosis not present

## 2020-05-12 ENCOUNTER — Other Ambulatory Visit: Payer: Self-pay

## 2020-05-12 ENCOUNTER — Ambulatory Visit (HOSPITAL_COMMUNITY)
Admission: RE | Admit: 2020-05-12 | Discharge: 2020-05-12 | Disposition: A | Discharge status: Home / Self Care | Payer: Medicare HMO | Source: Ambulatory Visit | Attending: Family Medicine | Admitting: Family Medicine

## 2020-05-12 DIAGNOSIS — M5416 Radiculopathy, lumbar region: Secondary | ICD-10-CM | POA: Diagnosis not present

## 2020-05-12 DIAGNOSIS — S32030A Wedge compression fracture of third lumbar vertebra, initial encounter for closed fracture: Secondary | ICD-10-CM | POA: Diagnosis not present

## 2020-05-13 ENCOUNTER — Encounter: Payer: Self-pay | Admitting: *Deleted

## 2020-05-19 ENCOUNTER — Other Ambulatory Visit: Payer: Self-pay | Admitting: Family Medicine

## 2020-05-19 DIAGNOSIS — S32030A Wedge compression fracture of third lumbar vertebra, initial encounter for closed fracture: Secondary | ICD-10-CM

## 2020-05-26 ENCOUNTER — Other Ambulatory Visit: Payer: Self-pay | Admitting: *Deleted

## 2020-05-26 DIAGNOSIS — E038 Other specified hypothyroidism: Secondary | ICD-10-CM

## 2020-05-26 MED ORDER — LEVOTHYROXINE SODIUM 150 MCG PO TABS: 150.0000 ug | ORAL_TABLET | Freq: Every day | ORAL | 2 refills | Status: DC

## 2020-06-04 ENCOUNTER — Telehealth: Payer: Self-pay | Admitting: Family Medicine

## 2020-06-04 DIAGNOSIS — I10 Essential (primary) hypertension: Secondary | ICD-10-CM

## 2020-06-04 MED ORDER — METOPROLOL SUCCINATE ER 50 MG PO TB24: 50.0000 mg | ORAL_TABLET | Freq: Every day | ORAL | 1 refills | Status: DC

## 2020-06-04 MED ORDER — METOPROLOL SUCCINATE ER 50 MG PO TB24: 50.0000 mg | ORAL_TABLET | Freq: Every day | ORAL | 0 refills | Status: DC

## 2020-06-04 NOTE — Telephone Encounter (Signed)
Pt is completely out of her metoprolol and she was last seen 02/2020 and told to follow up in 6 months. 30 day supply sent to local pharmacy and then 90 days supply sent into mail pharmacy and pt is aware.

## 2020-06-04 NOTE — Telephone Encounter (Signed)
  Prescription Request  06/04/2020  What is the name of the medication or equipment? Metoprolol  Have you contacted your pharmacy to request a refill? (if applicable) No  Which pharmacy would you like this sent to? [facility]   Patient notified that their request is being sent to the clinical staff for review and that they should receive a response within 2 business days.   Stacks' pt.  She is completely out.  Please call her.

## 2020-06-08 DIAGNOSIS — S32010G Wedge compression fracture of first lumbar vertebra, subsequent encounter for fracture with delayed healing: Secondary | ICD-10-CM | POA: Diagnosis not present

## 2020-06-08 DIAGNOSIS — M1710 Unilateral primary osteoarthritis, unspecified knee: Secondary | ICD-10-CM | POA: Diagnosis not present

## 2020-06-08 DIAGNOSIS — M1712 Unilateral primary osteoarthritis, left knee: Secondary | ICD-10-CM | POA: Diagnosis not present

## 2020-06-10 ENCOUNTER — Ambulatory Visit (INDEPENDENT_AMBULATORY_CARE_PROVIDER_SITE_OTHER): Payer: Medicare HMO | Admitting: Family Medicine

## 2020-06-10 ENCOUNTER — Encounter: Payer: Self-pay | Admitting: Family Medicine

## 2020-06-10 DIAGNOSIS — R3989 Other symptoms and signs involving the genitourinary system: Secondary | ICD-10-CM | POA: Diagnosis not present

## 2020-06-10 LAB — MICROSCOPIC EXAMINATION
RBC, Urine: NONE SEEN /hpf (ref 0–2)
WBC, UA: >30 /hpf — AB (ref 0–5)

## 2020-06-10 LAB — URINALYSIS, COMPLETE
Bilirubin, UA: NEGATIVE
Glucose, UA: NEGATIVE
Nitrite, UA: POSITIVE — AB
RBC, UA: NEGATIVE
Specific Gravity, UA: 1.02 (ref 1.005–1.030)
Urobilinogen, Ur: 1 mg/dL (ref 0.2–1.0)
pH, UA: 7 (ref 5.0–7.5)

## 2020-06-10 MED ORDER — CEFDINIR 300 MG PO CAPS: 300.0000 mg | ORAL_CAPSULE | Freq: Two times a day (BID) | ORAL | 0 refills | Status: AC

## 2020-06-10 NOTE — Progress Notes (Signed)
Virtual Visit via Telephone Note  I connected with [name] on 06/10/20 at 2:06 PM by telephone and verified that I am speaking with the correct person using two identifiers. [name] is currently located at a friends house and her friend is currently with her during this visit. The provider, Loman Brooklyn, FNP is located in their office at time of visit.  I discussed the limitations, risks, security and privacy concerns of performing an evaluation and management service by telephone and the availability of in person appointments. I also discussed with the patient that there may be a patient responsible charge related to this service. The patient expressed understanding and agreed to proceed.  Subjective: PCP: Claretta Fraise, MD  Chief Complaint  Patient presents with  . Urinary Tract Infection   Urinary Tract Infection: Patient complains of dysuria and foul smelling urine. She has had symptoms for 1 week. Patient denies back pain, fever and stomach ache. Patient does have a history of recurrent UTI.  Patient does have a history of pyelonephritis.    ROS: Per HPI  Current Outpatient Medications:  .  alendronate (FOSAMAX) 70 MG tablet, Take 1 tablet (70 mg total) by mouth once a week. Take with a full glass of water on an empty stomach., Disp: 13 tablet, Rfl: 3 .  aspirin EC 81 MG tablet, Take 81 mg by mouth daily., Disp: , Rfl:  .  Coenzyme Q10 (CO Q-10) 100 MG CAPS, Take 100 mg by mouth daily., Disp: 100 each, Rfl: 10 .  DULoxetine (CYMBALTA) 60 MG capsule, Take 1 capsule (60 mg total) by mouth daily., Disp: 90 capsule, Rfl: 1 .  folic acid (FOLVITE) 431 MCG tablet, Take 2,000 mcg by mouth daily., Disp: , Rfl:  .  levothyroxine (SYNTHROID) 150 MCG tablet, Take 1 tablet (150 mcg total) by mouth daily., Disp: 90 tablet, Rfl: 2 .  liothyronine (CYTOMEL) 5 MCG tablet, Take 2 tablets (10 mcg total) by mouth daily., Disp: 180 tablet, Rfl: 1 .  metoprolol succinate (TOPROL-XL)  50 MG 24 hr tablet, Take 1 tablet (50 mg total) by mouth daily. For blood pressure control, Disp: 90 tablet, Rfl: 1 .  nabumetone (RELAFEN) 500 MG tablet, Take 2 tablets (1,000 mg total) by mouth 2 (two) times daily. For muscle and joint pain, Disp: 360 tablet, Rfl: 1 .  olmesartan-hydrochlorothiazide (BENICAR HCT) 40-25 MG tablet, Take 1 tablet by mouth daily., Disp: 90 tablet, Rfl: 1 .  predniSONE (DELTASONE) 10 MG tablet, Take 5 daily for 3 days followed by 4,3,2 and 1 for 3 days each., Disp: 45 tablet, Rfl: 0 .  rosuvastatin (CRESTOR) 10 MG tablet, Take 1 tablet (10 mg total) by mouth daily., Disp: 90 tablet, Rfl: 1 .  thiamine (VITAMIN B-1) 100 MG tablet, Take 1 tablet (100 mg total) by mouth daily., Disp: 90 tablet, Rfl: 3  Allergies  Allergen Reactions  . Ciprofloxacin Other (See Comments)    Caused pt to have delusional thoughts and hallucinations  . Procardia [Nifedipine] Swelling    Edema    . Norvasc [Amlodipine Besylate] Hives   Past Medical History:  Diagnosis Date  . Alcoholism ([place]) 01/09/2012  . Anemia   . Anxiety   . Arthritis    Left knee  . B12 deficiency   . Carotid artery narrowing 10/03/2012   On the right.  . Chronic kidney disease (CKD), stage II (mild)   . COPD (chronic obstructive pulmonary disease) ([place])   . Depression   .  Headache(784.0)   . Hyperlipidemia   . Hypertension   . Hyponatremia 01/09/2012  . Hypothyroid   . Menopause   . Mini stroke ([place])   . Orthostatic syncope 10/02/2012  . Pneumonia   . SBO (small bowel obstruction) ([place]) 06/28/2016  . Shortness of breath   . Stroke [facility]) 2010   no deficits    Observations/Objective: A&O  No respiratory distress or wheezing audible over the phone Mood, judgement, and thought processes all WNL   Assessment and Plan: 1. Suspected UTI - Education provided on UTIs. Encouraged adequate hydration.  - cefdinir (OMNICEF) 300 MG capsule; Take 1 capsule (300 mg total) by mouth 2 (two) times daily for  10 days. 1 po BID  Dispense: 20 capsule; Refill: 0   Follow Up Instructions:  I discussed the assessment and treatment plan with the patient. The patient was provided an opportunity to ask questions and all were answered. The patient agreed with the plan and demonstrated an understanding of the instructions.   The patient was advised to call back or seek an in-person evaluation if the symptoms worsen or if the condition fails to improve as anticipated.  The above assessment and management plan was discussed with the patient. The patient verbalized understanding of and has agreed to the management plan. Patient is aware to call the clinic if symptoms persist or worsen. Patient is aware when to return to the clinic for a follow-up visit. Patient educated on when it is appropriate to go to the emergency department.   Time call ended: 2:09 PM  I provided 5 minutes of non-face-to-face time during this encounter.  Hendricks Limes, MSN, APRN, FNP-C [facility] 06/10/20

## 2020-06-10 NOTE — Addendum Note (Signed)
Addended by: Karle Plumber on: 06/10/2020 03:53 PM   Modules accepted: Orders

## 2020-06-10 NOTE — Patient Instructions (Signed)
Urinary Tract Infection, Adult A urinary tract infection (UTI) is an infection of any part of the urinary tract. The urinary tract includes:  The kidneys.  The ureters.  The bladder.  The urethra. These organs make, store, and get rid of pee (urine) in the body. What are the causes? This is caused by germs (bacteria) in your genital area. These germs grow and cause swelling (inflammation) of your urinary tract. What increases the risk? You are more likely to develop this condition if:  You have a small, thin tube (catheter) to drain pee.  You cannot control when you pee or poop (incontinence).  You are female, and: ? You use these methods to prevent pregnancy:  A medicine that kills sperm (spermicide).  A device that blocks sperm (diaphragm). ? You have low levels of a female hormone (estrogen). ? You are pregnant.  You have genes that add to your risk.  You are sexually active.  You take antibiotic medicines.  You have trouble peeing because of: ? A prostate that is bigger than normal, if you are female. ? A blockage in the part of your body that drains pee from the bladder (urethra). ? A kidney stone. ? A nerve condition that affects your bladder (neurogenic bladder). ? Not getting enough to drink. ? Not peeing often enough.  You have other conditions, such as: ? Diabetes. ? A weak disease-fighting system (immune system). ? Sickle cell disease. ? Gout. ? Injury of the spine. What are the signs or symptoms? Symptoms of this condition include:  Needing to pee right away (urgently).  Peeing often.  Peeing small amounts often.  Pain or burning when peeing.  Blood in the pee.  Pee that smells bad or not like normal.  Trouble peeing.  Pee that is cloudy.  Fluid coming from the vagina, if you are female.  Pain in the belly or lower back. Other symptoms include:  Throwing up (vomiting).  No urge to eat.  Feeling mixed up (confused).  Being tired  and grouchy (irritable).  A fever.  Watery poop (diarrhea). How is this treated? This condition may be treated with:  Antibiotic medicine.  Other medicines.  Drinking enough water. Follow these instructions at home:  Medicines  Take over-the-counter and prescription medicines only as told by your doctor.  If you were prescribed an antibiotic medicine, take it as told by your doctor. Do not stop taking it even if you start to feel better. General instructions  Make sure you: ? Pee until your bladder is empty. ? Do not hold pee for a long time. ? Empty your bladder after sex. ? Wipe from front to back after pooping if you are a female. Use each tissue one time when you wipe.  Drink enough fluid to keep your pee pale yellow.  Keep all follow-up visits as told by your doctor. This is important. Contact a doctor if:  You do not get better after 1-2 days.  Your symptoms go away and then come back. Get help right away if:  You have very bad back pain.  You have very bad pain in your lower belly.  You have a fever.  You are sick to your stomach (nauseous).  You are throwing up. Summary  A urinary tract infection (UTI) is an infection of any part of the urinary tract.  This condition is caused by germs in your genital area.  There are many risk factors for a UTI. These include having a small, thin   tube to drain pee and not being able to control when you pee or poop.  Treatment includes antibiotic medicines for germs.  Drink enough fluid to keep your pee pale yellow. This information is not intended to replace advice given to you by your health care provider. Make sure you discuss any questions you have with your health care provider. Document Revised: 11/14/2018 Document Reviewed: 06/06/2018 Elsevier Patient Education  2020 Elsevier Inc.  

## 2020-06-14 LAB — URINE CULTURE

## 2020-06-21 ENCOUNTER — Telehealth: Payer: Self-pay | Admitting: Family Medicine

## 2020-06-21 NOTE — Telephone Encounter (Signed)
lmtcb

## 2020-06-22 DIAGNOSIS — M545 Low back pain: Secondary | ICD-10-CM | POA: Diagnosis not present

## 2020-06-22 DIAGNOSIS — M4856XA Collapsed vertebra, not elsewhere classified, lumbar region, initial encounter for fracture: Secondary | ICD-10-CM | POA: Diagnosis not present

## 2020-06-22 DIAGNOSIS — S32030A Wedge compression fracture of third lumbar vertebra, initial encounter for closed fracture: Secondary | ICD-10-CM | POA: Diagnosis not present

## 2020-06-30 ENCOUNTER — Emergency Department (HOSPITAL_COMMUNITY): Payer: Medicare HMO

## 2020-06-30 ENCOUNTER — Encounter (HOSPITAL_COMMUNITY): Payer: Self-pay | Admitting: Emergency Medicine

## 2020-06-30 ENCOUNTER — Other Ambulatory Visit: Payer: Self-pay

## 2020-06-30 ENCOUNTER — Emergency Department (HOSPITAL_COMMUNITY)
Admission: EM | Admit: 2020-06-30 | Discharge: 2020-06-30 | Disposition: A | Discharge status: Home / Self Care | Payer: Medicare HMO | Attending: Emergency Medicine | Admitting: Emergency Medicine

## 2020-06-30 DIAGNOSIS — Z79899 Other long term (current) drug therapy: Secondary | ICD-10-CM | POA: Insufficient documentation

## 2020-06-30 DIAGNOSIS — J449 Chronic obstructive pulmonary disease, unspecified: Secondary | ICD-10-CM | POA: Insufficient documentation

## 2020-06-30 DIAGNOSIS — Z87891 Personal history of nicotine dependence: Secondary | ICD-10-CM | POA: Diagnosis not present

## 2020-06-30 DIAGNOSIS — I129 Hypertensive chronic kidney disease with stage 1 through stage 4 chronic kidney disease, or unspecified chronic kidney disease: Secondary | ICD-10-CM | POA: Diagnosis not present

## 2020-06-30 DIAGNOSIS — Y92008 Other place in unspecified non-institutional (private) residence as the place of occurrence of the external cause: Secondary | ICD-10-CM | POA: Diagnosis not present

## 2020-06-30 DIAGNOSIS — Z7982 Long term (current) use of aspirin: Secondary | ICD-10-CM | POA: Insufficient documentation

## 2020-06-30 DIAGNOSIS — W01198A Fall on same level from slipping, tripping and stumbling with subsequent striking against other object, initial encounter: Secondary | ICD-10-CM | POA: Insufficient documentation

## 2020-06-30 DIAGNOSIS — Y939 Activity, unspecified: Secondary | ICD-10-CM | POA: Diagnosis not present

## 2020-06-30 DIAGNOSIS — E039 Hypothyroidism, unspecified: Secondary | ICD-10-CM | POA: Insufficient documentation

## 2020-06-30 DIAGNOSIS — I951 Orthostatic hypotension: Secondary | ICD-10-CM | POA: Insufficient documentation

## 2020-06-30 DIAGNOSIS — Y999 Unspecified external cause status: Secondary | ICD-10-CM | POA: Diagnosis not present

## 2020-06-30 DIAGNOSIS — G9389 Other specified disorders of brain: Secondary | ICD-10-CM | POA: Diagnosis not present

## 2020-06-30 DIAGNOSIS — N182 Chronic kidney disease, stage 2 (mild): Secondary | ICD-10-CM | POA: Diagnosis not present

## 2020-06-30 DIAGNOSIS — R55 Syncope and collapse: Secondary | ICD-10-CM | POA: Diagnosis present

## 2020-06-30 DIAGNOSIS — W19XXXA Unspecified fall, initial encounter: Secondary | ICD-10-CM

## 2020-06-30 DIAGNOSIS — S0990XA Unspecified injury of head, initial encounter: Secondary | ICD-10-CM | POA: Diagnosis not present

## 2020-06-30 MED ORDER — SODIUM CHLORIDE 0.9 % IV BOLUS
500.0000 mL | Freq: Once | INTRAVENOUS | Status: AC
  Administered 2020-06-30: 500 mL via INTRAVENOUS

## 2020-06-30 NOTE — ED Provider Notes (Signed)
Well-appearing 74 year old female who had an accidental slip and fall onto a brick porch about an hour ago.  She did hit the back of her head more towards the crown of the head.  On exam she does have a very small lump, hematoma approximately 1 cm in size, no open laceration, minimal tenderness in exam including normal neurologic function with all 4 extremities, normal speech and coordination and her facial symmetry is normal.  CT scan pending to rule out intracranial injury given age, otherwise well-appearing and anticipate discharge if negative.  Patient and family member both informed of plan and are agreeable.  Medical screening examination/treatment/procedure(s) were conducted as a shared visit with non-physician practitioner(s) and myself.  I personally evaluated the patient during the encounter.  Clinical Impression:   Final diagnoses:  Fall, initial encounter  Orthostatic hypotension         Noemi Chapel, MD 07/01/20 1208

## 2020-06-30 NOTE — ED Notes (Signed)
Patient transported to CT 

## 2020-06-30 NOTE — Discharge Instructions (Addendum)
The CT scan of your head today was reassuring.  Your blood pressure was low upon arrival and drops when you stand up from a lying position.  This is called orthostatic hypotension and can cause you to fall if you stand suddenly.  Is important that you stand and move slowly to avoid falling.  Try to drink plenty of water.  You may experience headache secondary to your fall today.  Follow-up with your primary doctor this week for recheck, return emergency department if you develop worsening symptoms such as sudden onset of severe headache, visual changes, vomiting, or difficulty with speech or standing

## 2020-06-30 NOTE — ED Provider Notes (Signed)
[facility] EMERGENCY DEPARTMENT Provider Note   CSN: [id number] Arrival date & time: 06/30/20  1451     History Chief Complaint  Patient presents with  . Fall    [name] is a 74 y.o. female.  HPI      [name] is a 74 y.o. female with past medical history of anemia, alcoholism, COPD, chronic kidney disease, and previous stroke without deficit who presents to the Emergency Department for evaluation secondary to a fall that occurred around 2:30 PM today.  She states that she had been sitting on the porch with her fianc and when she stood up to go back inside the house, she describes a fall backwards onto some bricks.  States that she struck her head but denies loss of consciousness.  Fall was witnessed by her son and fianc.  She reports multiple falls and has been evaluated by neurology without a clear cause of her frequent falls.  She denies any symptoms and states that she is only here because her fianc made her come in for evaluation.  She pacifically denies headache, dizziness, visual changes, chest pain or shortness of breath.  Low back or neck pain, and numbness or weakness of her extremities.    Past Medical History:  Diagnosis Date  . Alcoholism ([place]) 01/09/2012  . Anemia   . Anxiety   . Arthritis    Left knee  . B12 deficiency   . Carotid artery narrowing 10/03/2012   On the right.  . Chronic kidney disease (CKD), stage II (mild)   . COPD (chronic obstructive pulmonary disease) ([place])   . Depression   . Headache(784.0)   . Hyperlipidemia   . Hypertension   . Hyponatremia 01/09/2012  . Hypothyroid   . Menopause   . Mini stroke ([place])   . Orthostatic syncope 10/02/2012  . Pneumonia   . SBO (small bowel obstruction) ([place]) 06/28/2016  . Shortness of breath   . Stroke [facility]) 2010   no deficits    Patient Active Problem List   Diagnosis Date Noted  . Bacteremia due to Gram-negative bacteria 10/15/2019  . E. coli sepsis ([place]) 10/15/2019  . Acute  pyelonephritis 10/15/2019  . Thrombocytopenia ([place]) 10/15/2019  . Acute renal failure superimposed on stage 2 chronic kidney disease ([place]) 10/15/2019  . Ileitis   . Sepsis secondary to UTI ([place]) 10/12/2019  . Lactic acidosis 10/12/2019  . Arthritis   . Hyponatremia 04/21/2019  . Spinal stenosis, lumbar region with neurogenic claudication 05/25/2017  . Sinus tachycardia 06/29/2016  . Transaminitis 03/10/2016  . Pelvic mass 01/27/2016  . Colonic ulcer   . Diverticulosis of colon without hemorrhage   . Iron deficiency anemia due to chronic blood loss   . Lumbar pain 12/14/2015  . Chronic kidney disease (CKD), stage II (mild) 10/03/2012  . Carotid artery narrowing 10/03/2012  . Hypokalemia 10/02/2012  . Hypothyroidism 10/02/2012  . Essential hypertension 01/09/2012  . Hyperlipidemia 01/09/2012  . B12 deficiency 01/09/2012    Past Surgical History:  Procedure Laterality Date  . AGILE CAPSULE N/A 02/22/2016   dummy capsule remained in the distal ileum  . APPENDECTOMY    . CATARACT EXTRACTION W/PHACO  06/20/2012   Procedure: CATARACT EXTRACTION PHACO AND INTRAOCULAR LENS PLACEMENT (IOC);  Surgeon: Tonny Branch, MD;  Location: [facility]S;  Service: Ophthalmology;  Laterality: Right;  CDE:10.66  . CATARACT EXTRACTION W/PHACO  07/11/2012   Procedure: CATARACT EXTRACTION PHACO AND INTRAOCULAR LENS PLACEMENT (IOC);  Surgeon: Tonny Branch, MD;  Location: [facility];  Service: Ophthalmology;  Laterality: Left;  CDE: 12.69  . COLONOSCOPY N/A 12/17/2015   RMR: Marked circumferential ulceration of the ascending colon/Ileocecal valve ulceration with biopsy most consistent with NSAID injury versus inflammatory bowel disease, pancolonoc diverticulosis redundant colon  . ESOPHAGOGASTRODUODENOSCOPY N/A 12/17/2015   ATF:TDDUKGURK gastric polyp, biopsy benign  . KNEE ARTHROSCOPY Right 09/2016  . LUMBAR LAMINECTOMY/DECOMPRESSION MICRODISCECTOMY N/A 05/25/2017   Procedure: CENTRAL DECOMPRESSIVE LUMBAR LAMINECTOMY FOR  SPINAL STENOSIS L3-L4, FORAMINOTOMY FOR THE L4 ROOT AND L5 ROOT;  Surgeon: Latanya Maudlin, MD;  Location: [facility];  Service: Orthopedics;  Laterality: N/A;  . TONSILLECTOMY       OB History   No obstetric history on file.     Family History  Problem Relation Age of Onset  . GER disease Mother   . Ulcers Mother   . Hypertension Maternal Grandmother     Social History   Tobacco Use  . Smoking status: Former Smoker    Packs/day: 2.00    Years: 30.00    Pack years: 60.00    Types: Cigarettes    Quit date: 06/17/1992    Years since quitting: 28.0  . Smokeless tobacco: Never Used  Vaping Use  . Vaping Use: Never used  Substance Use Topics  . Alcohol use: No  . Drug use: No    Home Medications Prior to Admission medications   Medication Sig Start Date End Date Taking? Authorizing Provider  alendronate (FOSAMAX) 70 MG tablet Take 1 tablet (70 mg total) by mouth once a week. Take with a full glass of water on an empty stomach. 01/19/20   Claretta Fraise, MD  aspirin EC 81 MG tablet Take 81 mg by mouth daily.    [provider]  Coenzyme Q10 (CO Q-10) 100 MG CAPS Take 100 mg by mouth daily. 11/15/16   Claretta Fraise, MD  DULoxetine (CYMBALTA) 60 MG capsule Take 1 capsule (60 mg total) by mouth daily. 01/19/20   Claretta Fraise, MD  folic acid (FOLVITE) 270 MCG tablet Take 2,000 mcg by mouth daily.    [provider]  levothyroxine (SYNTHROID) 150 MCG tablet Take 1 tablet (150 mcg total) by mouth daily. 05/26/20   Claretta Fraise, MD  liothyronine (CYTOMEL) 5 MCG tablet Take 2 tablets (10 mcg total) by mouth daily. 01/19/20   Claretta Fraise, MD  metoprolol succinate (TOPROL-XL) 50 MG 24 hr tablet Take 1 tablet (50 mg total) by mouth daily. For blood pressure control 06/04/20   Claretta Fraise, MD  nabumetone (RELAFEN) 500 MG tablet Take 2 tablets (1,000 mg total) by mouth 2 (two) times daily. For muscle and joint pain 01/19/20   Claretta Fraise, MD  olmesartan-hydrochlorothiazide  (BENICAR HCT) 40-25 MG tablet Take 1 tablet by mouth daily. 01/19/20   Claretta Fraise, MD  predniSONE (DELTASONE) 10 MG tablet Take 5 daily for 3 days followed by 4,3,2 and 1 for 3 days each. 04/28/20   Claretta Fraise, MD  rosuvastatin (CRESTOR) 10 MG tablet Take 1 tablet (10 mg total) by mouth daily. 01/19/20   Claretta Fraise, MD  thiamine (VITAMIN B-1) 100 MG tablet Take 1 tablet (100 mg total) by mouth daily. 04/20/16   Claretta Fraise, MD    Allergies    Ciprofloxacin, Procardia [nifedipine], and Norvasc [amlodipine besylate]  Review of Systems   Review of Systems  Constitutional: Negative for chills, fatigue and fever.  HENT: Negative for sore throat and trouble swallowing.   Eyes: Negative for visual disturbance.  Respiratory: Negative  for shortness of breath and wheezing.   Cardiovascular: Negative for chest pain.  Gastrointestinal: Negative for abdominal pain, nausea and vomiting.  Genitourinary: Negative for dysuria, flank pain and hematuria.  Musculoskeletal: Negative for arthralgias, back pain, myalgias and neck pain.  Skin: Negative for rash.  Neurological: Negative for dizziness, syncope, facial asymmetry, speech difficulty, weakness, numbness and headaches.  Hematological: Does not bruise/bleed easily.    Physical Exam Updated Vital Signs BP (!) 91/58   Pulse 83   Temp (!) 97.5 F (36.4 C) (Temporal)   Resp 16   Ht 5\' 8"  (1.727 m)   Wt 63.5 kg   LMP 08/21/2013   SpO2 98%   BMI 21.29 kg/m   Physical Exam Vitals and nursing note reviewed.  Constitutional:      Appearance: Normal appearance. She is normal weight.  HENT:     Head: Normocephalic. No abrasion or laceration.     Comments: 1 cm hematoma of the scalp.  No abrasions or lacerations.     Mouth/Throat:     Mouth: Mucous membranes are moist.  Eyes:     Extraocular Movements: Extraocular movements intact.     Conjunctiva/sclera: Conjunctivae normal.     Pupils: Pupils are equal, round, and reactive to light.   Neck:     Thyroid: No thyromegaly.     Meningeal: Kernig's sign absent.  Cardiovascular:     Rate and Rhythm: Normal rate and regular rhythm.     Pulses: Normal pulses.  Pulmonary:     Effort: Pulmonary effort is normal.     Breath sounds: Normal breath sounds. No wheezing.  Chest:     Chest wall: No tenderness.  Abdominal:     Palpations: Abdomen is soft.     Tenderness: There is no abdominal tenderness. There is no guarding or rebound.  Musculoskeletal:        General: Normal range of motion.     Cervical back: Normal range of motion and neck supple. No tenderness.     Right lower leg: No edema.     Left lower leg: No edema.  Skin:    General: Skin is warm and dry.     Capillary Refill: Capillary refill takes less than 2 seconds.     Findings: No rash.  Neurological:     General: No focal deficit present.     Mental Status: She is alert.     GCS: GCS eye subscore is 4. GCS verbal subscore is 5. GCS motor subscore is 6.     Sensory: Sensation is intact. No sensory deficit.     Motor: Motor function is intact. No weakness.     Coordination: Coordination is intact.     Comments: CN II through XII intact.  Speech clear.  No pronator drift.  Grip strengths are strong and symmetrical bilaterally.  No lower extremity weakness on exam.  Normal finger-nose testing.     ED Results / Procedures / Treatments   Labs (all labs ordered are listed, but only abnormal results are displayed) Labs Reviewed - No data to display  EKG None  Radiology No results found.  Procedures Procedures (including critical care time)  Medications Ordered in ED Medications - No data to display  ED Course  I have reviewed the triage vital signs and the nursing notes.  Pertinent labs & imaging results that were available during my care of the patient were reviewed by me and considered in my medical decision making (see chart for details).  MDM Rules/Calculators/A&P                           Patient well-appearing.  Nontoxic.  No focal neuro deficits on exam.  Fall onto some bricks 1-1/2 to 2 hours prior to arrival.  She does take aspirin daily, but denies other anticoagulants.  Orthostatic VS - Lying from 06/29/20 1923 to 06/30/20 1923  Date/Time  06/30/20 1719  BP- Lying: 129/70  Pulse- Lying: 81  User: BLO   Orthostatic VS - Sitting from 06/29/20 1923 to 06/30/20 1923  Date/Time  06/30/20 1719  BP- Sitting: 137/75  Pulse- Sitting: 76  Who: BLO   Orthostatic VS - Standing from 06/29/20 1923 to 06/30/20 1923  Date/Time  06/30/20 1719  BP- Standing at 0 minutes: 101/74  Pulse- Standing at 0 minutes: 83       Pt has hx of orthostatic hypotension.  On recheck, she is feeling better.  Blood pressure has improved.  She is now 137/75.  Sitting on the edge of the stretcher talking with person at bedside.  CT scan reassuring.  Eating crackers and drinking water. Will give patient 500 cc bolus of IV saline.  I feel that she is appropriate for discharge home, head injury instructions were discussed.  She agrees to close follow-up with PCP.   Final Clinical Impression(s) / ED Diagnoses Final diagnoses:  Fall, initial encounter  Orthostatic hypotension    Rx / DC Orders ED Discharge Orders    None       Bufford Lope 06/30/20 1936    Noemi Chapel, MD 07/01/20 1208

## 2020-06-30 NOTE — ED Triage Notes (Signed)
Pt tripped and fell onto brick porch x1 hour ago. Pt admits to hitting head during fall. States family wanted pt to be evaluated. Denies sx, pain or LOC from fall.

## 2020-07-07 ENCOUNTER — Telehealth: Payer: Self-pay | Admitting: Family Medicine

## 2020-07-07 NOTE — Telephone Encounter (Signed)
Making appt

## 2020-07-13 ENCOUNTER — Other Ambulatory Visit: Payer: Self-pay | Admitting: Family Medicine

## 2020-07-13 DIAGNOSIS — I10 Essential (primary) hypertension: Secondary | ICD-10-CM

## 2020-07-13 NOTE — Telephone Encounter (Signed)
Pt aware refill sent 06/04/20 #90 with a refill to [facility]

## 2020-07-13 NOTE — Telephone Encounter (Signed)
  Prescription Request  07/13/2020  What is the name of the medication or equipment? metoprololer 50 mg  Have you contacted your pharmacy to request a refill? (if applicable) yes  Which pharmacy would you like this sent to? [facility]   Patient notified that their request is being sent to the clinical staff for review and that they should receive a response within 2 business days.

## 2020-07-16 ENCOUNTER — Other Ambulatory Visit: Payer: Self-pay | Admitting: Family Medicine

## 2020-07-16 DIAGNOSIS — E782 Mixed hyperlipidemia: Secondary | ICD-10-CM

## 2020-07-16 DIAGNOSIS — I10 Essential (primary) hypertension: Secondary | ICD-10-CM

## 2020-08-02 ENCOUNTER — Ambulatory Visit (INDEPENDENT_AMBULATORY_CARE_PROVIDER_SITE_OTHER): Payer: Medicare HMO | Admitting: Family

## 2020-08-02 ENCOUNTER — Encounter: Payer: Self-pay | Admitting: Family

## 2020-08-02 DIAGNOSIS — R399 Unspecified symptoms and signs involving the genitourinary system: Secondary | ICD-10-CM

## 2020-08-02 DIAGNOSIS — R300 Dysuria: Secondary | ICD-10-CM

## 2020-08-02 LAB — URINALYSIS, COMPLETE
Bilirubin, UA: NEGATIVE
Glucose, UA: NEGATIVE
Ketones, UA: NEGATIVE
Nitrite, UA: NEGATIVE
Protein,UA: NEGATIVE
RBC, UA: NEGATIVE
Specific Gravity, UA: 1.025 (ref 1.005–1.030)
Urobilinogen, Ur: 1 mg/dL (ref 0.2–1.0)
pH, UA: 7 (ref 5.0–7.5)

## 2020-08-02 LAB — MICROSCOPIC EXAMINATION: RBC, Urine: NONE SEEN /hpf (ref 0–2)

## 2020-08-02 MED ORDER — CEFTRIAXONE SODIUM 1 G IJ SOLR
1.0000 g | Freq: Once | INTRAMUSCULAR | Status: AC
  Administered 2020-08-02: 1 g via INTRAMUSCULAR

## 2020-08-02 MED ORDER — CEPHALEXIN 500 MG PO CAPS: 500.0000 mg | ORAL_CAPSULE | Freq: Two times a day (BID) | ORAL | 0 refills | Status: DC

## 2020-08-02 NOTE — Progress Notes (Signed)
   Virtual Visit via telephone Note Due to COVID-19 pandemic this visit was conducted virtually. This visit type was conducted due to national recommendations for restrictions regarding the COVID-19 Pandemic (e.g. social distancing, sheltering in place) in an effort to limit this patient's exposure and mitigate transmission in our community. All issues noted in this document were discussed and addressed.  A physical exam was not performed with this format.  I connected with [name] on 08/02/20 at 12:16 pm  by telephone and verified that I am speaking with the correct person using two identifiers. [name] is currently located at office and no one  is currently with her during visit. The provider, Evelina Dun, FNP is located in their office at time of visit.  I discussed the limitations, risks, security and privacy concerns of performing an evaluation and management service by telephone and the availability of in person appointments. I also discussed with the patient that there may be a patient responsible charge related to this service. The patient expressed understanding and agreed to proceed.   History and Present Illness:  Dysuria  This is a recurrent problem. The current episode started 1 to 4 weeks ago. The problem occurs intermittently. The problem has been waxing and waning. The quality of the pain is described as burning. The pain is at a severity of 4/10. The pain is mild. There has been no fever. Associated symptoms include a discharge, flank pain, frequency, nausea and urgency. Pertinent negatives include no hematuria or hesitancy. She has tried increased fluids for the symptoms. The treatment provided mild relief.     Review of Systems  Gastrointestinal: Positive for nausea.  Genitourinary: Positive for dysuria, flank pain, frequency and urgency. Negative for hematuria and hesitancy.     Observations/Objective: No SOB or distress noted  Assessment and Plan: 1.  Dysuria - Urinalysis, Complete - Urine Culture - cefTRIAXone (ROCEPHIN) injection 1 g - cephALEXin (KEFLEX) 500 MG capsule; Take 1 capsule (500 mg total) by mouth 2 (two) times daily.  Dispense: 14 capsule; Refill: 0  2. UTI symptoms Force fluids RTO if symptoms are worsening or do not improve  Culture pending - cephALEXin (KEFLEX) 500 MG capsule; Take 1 capsule (500 mg total) by mouth 2 (two) times daily.  Dispense: 14 capsule; Refill: 0      I discussed the assessment and treatment plan with the patient. The patient was provided an opportunity to ask questions and all were answered. The patient agreed with the plan and demonstrated an understanding of the instructions.   The patient was advised to call back or seek an in-person evaluation if the symptoms worsen or if the condition fails to improve as anticipated.  The above assessment and management plan was discussed with the patient. The patient verbalized understanding of and has agreed to the management plan. Patient is aware to call the clinic if symptoms persist or worsen. Patient is aware when to return to the clinic for a follow-up visit. Patient educated on when it is appropriate to go to the emergency department.   Time call ended:  12:30 pm   I provided 14 minutes of non-face-to-face time during this encounter.    Evelina Dun, FNP

## 2020-08-07 LAB — URINE CULTURE

## 2020-08-12 ENCOUNTER — Telehealth: Payer: Medicare HMO | Admitting: *Deleted

## 2020-08-12 ENCOUNTER — Telehealth: Payer: Self-pay | Admitting: *Deleted

## 2020-08-12 NOTE — Telephone Encounter (Signed)
  Chronic Care Management   Outreach Note  08/12/2020 Name: [name] MRN: [id number] DOB: July 30, 1946  Referred by: Claretta Fraise, MD Reason for referral : Chronic Care Management (RN follow up)   An unsuccessful telephone follow-up was attempted today. The patient was referred to the case management team for assistance with care management and care coordination.   Follow Up Plan: A HIPAA compliant phone message was left for the patient providing contact information and requesting a return call.  The care management team will reach out to the patient again over the next 10 days.   Chong Sicilian, BSN, RN-BC Embedded Chronic Care Manager [facility] Direct Dial: [phone]

## 2020-09-02 NOTE — Telephone Encounter (Signed)
This lady was so sweet. She has been r/s

## 2020-09-05 NOTE — Telephone Encounter (Signed)
She is very sweet :) Thank you for helping her to reschedule.

## 2020-09-06 ENCOUNTER — Ambulatory Visit: Payer: Medicare HMO | Admitting: Family Medicine

## 2020-09-07 ENCOUNTER — Other Ambulatory Visit: Payer: Self-pay

## 2020-09-07 ENCOUNTER — Ambulatory Visit (INDEPENDENT_AMBULATORY_CARE_PROVIDER_SITE_OTHER): Payer: Medicare HMO

## 2020-09-07 ENCOUNTER — Ambulatory Visit (INDEPENDENT_AMBULATORY_CARE_PROVIDER_SITE_OTHER): Payer: Medicare HMO | Admitting: Family Medicine

## 2020-09-07 ENCOUNTER — Encounter: Payer: Self-pay | Admitting: Family Medicine

## 2020-09-07 VITALS — BP 146/78 | HR 78 | Temp 97.8°F | Resp 20 | Ht 68.0 in | Wt 155.0 lb

## 2020-09-07 DIAGNOSIS — R300 Dysuria: Secondary | ICD-10-CM | POA: Diagnosis not present

## 2020-09-07 DIAGNOSIS — E538 Deficiency of other specified B group vitamins: Secondary | ICD-10-CM

## 2020-09-07 DIAGNOSIS — M8589 Other specified disorders of bone density and structure, multiple sites: Secondary | ICD-10-CM | POA: Diagnosis not present

## 2020-09-07 DIAGNOSIS — E038 Other specified hypothyroidism: Secondary | ICD-10-CM

## 2020-09-07 DIAGNOSIS — E782 Mixed hyperlipidemia: Secondary | ICD-10-CM | POA: Diagnosis not present

## 2020-09-07 DIAGNOSIS — Z780 Asymptomatic menopausal state: Secondary | ICD-10-CM

## 2020-09-07 DIAGNOSIS — I10 Essential (primary) hypertension: Secondary | ICD-10-CM

## 2020-09-07 DIAGNOSIS — M85852 Other specified disorders of bone density and structure, left thigh: Secondary | ICD-10-CM | POA: Diagnosis not present

## 2020-09-07 DIAGNOSIS — Z23 Encounter for immunization: Secondary | ICD-10-CM | POA: Diagnosis not present

## 2020-09-07 LAB — URINALYSIS, COMPLETE
Bilirubin, UA: NEGATIVE
Glucose, UA: NEGATIVE
Ketones, UA: NEGATIVE
Nitrite, UA: NEGATIVE
Specific Gravity, UA: 1.025 (ref 1.005–1.030)
Urobilinogen, Ur: 1 mg/dL (ref 0.2–1.0)
pH, UA: 7 (ref 5.0–7.5)

## 2020-09-07 LAB — MICROSCOPIC EXAMINATION: WBC, UA: >30 /hpf — AB (ref 0–5)

## 2020-09-07 MED ORDER — METOPROLOL SUCCINATE ER 100 MG PO TB24: 100.0000 mg | ORAL_TABLET | Freq: Every day | ORAL | 1 refills | Status: DC

## 2020-09-07 MED ORDER — NITROFURANTOIN MONOHYD MACRO 100 MG PO CAPS: 100.0000 mg | ORAL_CAPSULE | Freq: Two times a day (BID) | ORAL | 0 refills | Status: DC

## 2020-09-07 MED ORDER — NITROFURANTOIN MACROCRYSTAL 100 MG PO CAPS: 100.0000 mg | ORAL_CAPSULE | Freq: Every day | ORAL | 5 refills | Status: DC

## 2020-09-07 NOTE — Progress Notes (Signed)
Subjective:  Patient ID: [name], female    DOB: [birth year]  Age: 74 y.o. MRN: [id number]  CC: Medical Management of Chronic Issues   HPI [name] presents for continued lower back pain in spite of her recent thoracoplasty.  She has not communicated this to her neurosurgeon however.  She has a history of multiple compression fractures.  She is due today for bone density test.   Follow-up of hypertension. Patient has no history of headache chest pain or shortness of breath or recent cough. Patient also denies symptoms of TIA such as numbness weakness lateralizing. Patient checks  blood pressure at home and has not had any elevated readings recently. Patient denies side effects from his medication. States taking it regularly.   Patient also is having more UTI symptoms.  Primarily frequency and some dysuria.  She is sexually active and this seems to flare every time they are intermittent.    Depression screen [facility] 2/9 09/07/2020 04/26/2020 03/03/2020  Decreased Interest 0 0 0  Down, Depressed, Hopeless 0 0 0  PHQ - 2 Score 0 0 0  Altered sleeping - - -  Tired, decreased energy - - -  Change in appetite - - -  Feeling bad or failure about yourself  - - -  Trouble concentrating - - -  Moving slowly or fidgety/restless - - -  Suicidal thoughts - - -  PHQ-9 Score - - -  Some recent data might be hidden    History [name] has a past medical history of Acute pyelonephritis (10/15/2019), Acute renal failure superimposed on stage 2 chronic kidney disease ([place]) (10/15/2019), Alcoholism ([place]) (01/09/2012), Anemia, Anxiety, Arthritis, B12 deficiency, Carotid artery narrowing (10/03/2012), Chronic kidney disease (CKD), stage II (mild), COPD (chronic obstructive pulmonary disease) ([place]), Depression, Headache(784.0), Hyperlipidemia, Hypertension, Hyponatremia (01/09/2012), Hypothyroid, Menopause, Mini stroke ([place]), Orthostatic syncope (10/02/2012), Pneumonia, SBO (small bowel obstruction) ([place])  (06/28/2016), Shortness of breath, Stroke ([place]) (2010), and Thrombocytopenia ([place]) (10/15/2019).   She has a past surgical history that includes Appendectomy; Tonsillectomy; Cataract extraction w/PHACO (06/20/2012); Cataract extraction w/PHACO (07/11/2012); Colonoscopy (N/A, 12/17/2015); Esophagogastroduodenoscopy (N/A, 12/17/2015); Agile capsule (N/A, 02/22/2016); Knee arthroscopy (Right, 09/2016); and Lumbar laminectomy/decompression microdiscectomy (N/A, 05/25/2017).   Her family history includes GER disease in her mother; Hypertension in her maternal grandmother; Ulcers in her mother.She reports that she quit smoking about 28 years ago. Her smoking use included cigarettes. She has a 60.00 pack-year smoking history. She has never used smokeless tobacco. She reports that she does not drink alcohol and does not use drugs.    ROS Review of Systems  Constitutional: Negative.   HENT: Negative.   Eyes: Negative for visual disturbance.  Respiratory: Negative for shortness of breath.   Cardiovascular: Negative for chest pain.  Gastrointestinal: Negative for abdominal pain.  Genitourinary: Positive for difficulty urinating and dysuria.  Musculoskeletal: Positive for arthralgias and back pain.    Objective:  BP (!) 146/78   Pulse 78   Temp 97.8 F (36.6 C) (Temporal)   Resp 20   Ht 5' 8"  (1.727 m)   Wt 155 lb (70.3 kg)   LMP 08/21/2013   SpO2 97%   BMI 23.57 kg/m   BP Readings from Last 3 Encounters:  09/07/20 (!) 146/78  06/30/20 137/75  04/26/20 132/83    Wt Readings from Last 3 Encounters:  09/07/20 155 lb (70.3 kg)  06/30/20 140 lb (63.5 kg)  04/26/20 164 lb (74.4 kg)     Physical Exam Constitutional:  General: She is not in acute distress.    Appearance: She is well-developed.  HENT:     Head: Normocephalic and atraumatic.  Eyes:     Conjunctiva/sclera: Conjunctivae normal.     Pupils: Pupils are equal, round, and reactive to light.  Neck:     Thyroid: No thyromegaly.    Cardiovascular:     Rate and Rhythm: Normal rate and regular rhythm.     Heart sounds: Normal heart sounds. No murmur heard.   Pulmonary:     Effort: Pulmonary effort is normal. No respiratory distress.     Breath sounds: Normal breath sounds. No wheezing or rales.  Abdominal:     General: Bowel sounds are normal. There is no distension.     Palpations: Abdomen is soft.     Tenderness: There is no abdominal tenderness.  Musculoskeletal:        General: Normal range of motion.     Cervical back: Normal range of motion and neck supple.  Lymphadenopathy:     Cervical: No cervical adenopathy.  Skin:    General: Skin is warm and dry.  Neurological:     Mental Status: She is alert and oriented to person, place, and time.  Psychiatric:        Behavior: Behavior normal.        Thought Content: Thought content normal.        Judgment: Judgment normal.    Results for orders placed or performed in visit on 09/07/20  Microscopic Examination   Urine  Result Value Ref Range   WBC, UA >30 (A) 0 - 5 /hpf   RBC 0-2 0 - 2 /hpf   Epithelial Cells (non renal) 0-10 0 - 10 /hpf   Bacteria, UA Many (A) None seen/Few  Urinalysis, Complete  Result Value Ref Range   Specific Gravity, UA 1.025 1.005 - 1.030   pH, UA 7.0 5.0 - 7.5   Color, UA Yellow Yellow   Appearance Ur Cloudy (A) Clear   Leukocytes,UA 3+ (A) Negative   Protein,UA 1+ (A) Negative/Trace   Glucose, UA Negative Negative   Ketones, UA Negative Negative   RBC, UA 1+ (A) Negative   Bilirubin, UA Negative Negative   Urobilinogen, Ur 1.0 0.2 - 1.0 mg/dL   Nitrite, UA Negative Negative   Microscopic Examination See below:      Assessment & Plan:   [name] was seen today for medical management of chronic issues.  Diagnoses and all orders for this visit:  Other specified hypothyroidism -     TSH + free T4 -     T3, free  Dysuria -     Urinalysis, Complete -     Urine Culture -     Microscopic Examination -      nitrofurantoin, macrocrystal-monohydrate, (MACROBID) 100 MG capsule; Take 1 capsule (100 mg total) by mouth 2 (two) times daily. -     nitrofurantoin (MACRODANTIN) 100 MG capsule; Take 1 capsule (100 mg total) by mouth daily. To prevent bladder infection  Need for immunization against influenza -     Flu Vaccine QUAD High Dose(Fluad)  Essential hypertension -     CBC with Differential/Platelet -     CMP14+EGFR  Mixed hyperlipidemia -     CMP14+EGFR -     Lipid panel  B12 deficiency  Osteopenia of multiple sites -     VITAMIN D 25 Hydroxy (Vit-D Deficiency, Fractures) -     DG Bone Density; Future  Accelerated hypertension -  metoprolol succinate (TOPROL-XL) 100 MG 24 hr tablet; Take 1 tablet (100 mg total) by mouth daily. For blood pressure control       I have discontinued [name]. Price's predniSONE and cephALEXin. I have also changed her metoprolol succinate. Additionally, I am having her start on nitrofurantoin (macrocrystal-monohydrate) and nitrofurantoin. Lastly, I am having her maintain her aspirin EC, thiamine, Co K-32, folic acid, nabumetone, liothyronine, DULoxetine, alendronate, levothyroxine, rosuvastatin, and olmesartan-hydrochlorothiazide.  Allergies as of 09/07/2020      Reactions   Ciprofloxacin Other (See Comments)   Caused pt to have delusional thoughts and hallucinations   Procardia [nifedipine] Swelling   Edema    Norvasc [amlodipine Besylate] Hives      Medication List       Accurate as of September 07, 2020 10:16 PM. If you have any questions, ask your nurse or doctor.        STOP taking these medications   cephALEXin 500 MG capsule Commonly known as: KEFLEX Stopped by: Claretta Fraise, MD   predniSONE 10 MG tablet Commonly known as: DELTASONE Stopped by: Claretta Fraise, MD     TAKE these medications   alendronate 70 MG tablet Commonly known as: FOSAMAX Take 1 tablet (70 mg total) by mouth once a week. Take with a full glass of water on  an empty stomach.   aspirin EC 81 MG tablet Take 81 mg by mouth daily.   Co Q-10 100 MG Caps Take 100 mg by mouth daily.   DULoxetine 60 MG capsule Commonly known as: Cymbalta Take 1 capsule (60 mg total) by mouth daily.   folic acid 761 MCG tablet Commonly known as: FOLVITE Take 2,000 mcg by mouth daily.   levothyroxine 150 MCG tablet Commonly known as: SYNTHROID Take 1 tablet (150 mcg total) by mouth daily.   liothyronine 5 MCG tablet Commonly known as: Cytomel Take 2 tablets (10 mcg total) by mouth daily.   metoprolol succinate 100 MG 24 hr tablet Commonly known as: TOPROL-XL Take 1 tablet (100 mg total) by mouth daily. For blood pressure control What changed:   medication strength  how much to take Changed by: Claretta Fraise, MD   nabumetone 500 MG tablet Commonly known as: RELAFEN Take 2 tablets (1,000 mg total) by mouth 2 (two) times daily. For muscle and joint pain   nitrofurantoin (macrocrystal-monohydrate) 100 MG capsule Commonly known as: Macrobid Take 1 capsule (100 mg total) by mouth 2 (two) times daily. Started by: Claretta Fraise, MD   nitrofurantoin 100 MG capsule Commonly known as: Macrodantin Take 1 capsule (100 mg total) by mouth daily. To prevent bladder infection Started by: Claretta Fraise, MD   olmesartan-hydrochlorothiazide 40-25 MG tablet Commonly known as: BENICAR HCT TAKE 1 TABLET EVERY DAY   rosuvastatin 10 MG tablet Commonly known as: CRESTOR TAKE 1 TABLET (10 MG TOTAL) BY MOUTH DAILY.   thiamine 100 MG tablet Commonly known as: Vitamin B-1 Take 1 tablet (100 mg total) by mouth daily.        Follow-up: Return in about 3 months (around 12/07/2020).  Claretta Fraise, M.D.

## 2020-09-08 LAB — CBC WITH DIFFERENTIAL/PLATELET
Basophils Absolute: 0 10*3/uL (ref 0.0–0.2)
Basos: 0 %
EOS (ABSOLUTE): 0.1 10*3/uL (ref 0.0–0.4)
Eos: 2 %
Hematocrit: 39.7 % (ref 34.0–46.6)
Hemoglobin: 13.3 g/dL (ref 11.1–15.9)
Immature Grans (Abs): 0 10*3/uL (ref 0.0–0.1)
Immature Granulocytes: 1 %
Lymphocytes Absolute: 2.1 10*3/uL (ref 0.7–3.1)
Lymphs: 31 %
MCH: 30.7 pg (ref 26.6–33.0)
MCHC: 33.5 g/dL (ref 31.5–35.7)
MCV: 92 fL (ref 79–97)
Monocytes Absolute: 0.7 10*3/uL (ref 0.1–0.9)
Monocytes: 11 %
Neutrophils Absolute: 3.7 10*3/uL (ref 1.4–7.0)
Neutrophils: 55 %
Platelets: 268 10*3/uL (ref 150–450)
RBC: 4.33 x10E6/uL (ref 3.77–5.28)
RDW: 14 % (ref 11.7–15.4)
WBC: 6.7 10*3/uL (ref 3.4–10.8)

## 2020-09-08 LAB — CMP14+EGFR
ALT: 9 IU/L (ref 0–32)
AST: 12 IU/L (ref 0–40)
Albumin/Globulin Ratio: 1.6 (ref 1.2–2.2)
Albumin: 4.8 g/dL — ABNORMAL HIGH (ref 3.7–4.7)
Alkaline Phosphatase: 107 IU/L (ref 44–121)
BUN/Creatinine Ratio: 12 (ref 12–28)
BUN: 15 mg/dL (ref 8–27)
Bilirubin Total: 0.6 mg/dL (ref 0.0–1.2)
CO2: 24 mmol/L (ref 20–29)
Calcium: 9.6 mg/dL (ref 8.7–10.3)
Chloride: 96 mmol/L (ref 96–106)
Creatinine, Ser: 1.28 mg/dL — ABNORMAL HIGH (ref 0.57–1.00)
GFR calc Af Amer: 48 mL/min/{1.73_m2} — ABNORMAL LOW (ref >59)
GFR calc non Af Amer: 42 mL/min/{1.73_m2} — ABNORMAL LOW (ref >59)
Globulin, Total: 3 g/dL (ref 1.5–4.5)
Glucose: 99 mg/dL (ref 65–99)
Potassium: 4.1 mmol/L (ref 3.5–5.2)
Sodium: 137 mmol/L (ref 134–144)
Total Protein: 7.8 g/dL (ref 6.0–8.5)

## 2020-09-08 LAB — LIPID PANEL
Chol/HDL Ratio: 2.9 ratio (ref 0.0–4.4)
Cholesterol, Total: 235 mg/dL — ABNORMAL HIGH (ref 100–199)
HDL: 80 mg/dL (ref >39)
LDL Chol Calc (NIH): 100 mg/dL — ABNORMAL HIGH (ref 0–99)
Triglycerides: 334 mg/dL — ABNORMAL HIGH (ref 0–149)
VLDL Cholesterol Cal: 55 mg/dL — ABNORMAL HIGH (ref 5–40)

## 2020-09-08 LAB — T3, FREE: T3, Free: 1.5 pg/mL — ABNORMAL LOW (ref 2.0–4.4)

## 2020-09-08 LAB — TSH+FREE T4
Free T4: 2.21 ng/dL — ABNORMAL HIGH (ref 0.82–1.77)
TSH: 0.208 u[IU]/mL — ABNORMAL LOW (ref 0.450–4.500)

## 2020-09-08 LAB — VITAMIN D 25 HYDROXY (VIT D DEFICIENCY, FRACTURES): Vit D, 25-Hydroxy: 17.4 ng/mL — ABNORMAL LOW (ref 30.0–100.0)

## 2020-09-12 LAB — URINE CULTURE

## 2020-09-13 ENCOUNTER — Other Ambulatory Visit: Payer: Self-pay | Admitting: Family Medicine

## 2020-09-13 DIAGNOSIS — E038 Other specified hypothyroidism: Secondary | ICD-10-CM

## 2020-09-13 MED ORDER — VITAMIN D (ERGOCALCIFEROL) 1.25 MG (50000 UNIT) PO CAPS: 50000.0000 [IU] | ORAL_CAPSULE | ORAL | 3 refills | Status: DC

## 2020-09-13 MED ORDER — LEVOTHYROXINE SODIUM 125 MCG PO TABS: 125.0000 ug | ORAL_TABLET | Freq: Every day | ORAL | 1 refills | Status: DC

## 2020-09-14 ENCOUNTER — Telehealth: Payer: Self-pay

## 2020-09-14 NOTE — Telephone Encounter (Signed)
Pt returned missed call from [facility] regarding lab results. Reviewed results with pt per Dr Livia Snellen notes. Pt voiced understanding. Pt scheduled appt to see Dr Livia Snellen for 2 mth ck on 11/02/20.

## 2020-09-15 NOTE — Telephone Encounter (Signed)
Patient aware and verbalized understanding. °

## 2020-09-20 DIAGNOSIS — S32030G Wedge compression fracture of third lumbar vertebra, subsequent encounter for fracture with delayed healing: Secondary | ICD-10-CM | POA: Diagnosis not present

## 2020-09-20 DIAGNOSIS — I10 Essential (primary) hypertension: Secondary | ICD-10-CM | POA: Diagnosis not present

## 2020-09-22 ENCOUNTER — Encounter: Payer: Self-pay | Admitting: Physical Therapy

## 2020-09-22 ENCOUNTER — Ambulatory Visit: Payer: Medicare HMO | Attending: Neurosurgery | Admitting: Physical Therapy

## 2020-09-22 ENCOUNTER — Other Ambulatory Visit: Payer: Self-pay

## 2020-09-22 DIAGNOSIS — R293 Abnormal posture: Secondary | ICD-10-CM | POA: Diagnosis not present

## 2020-09-22 DIAGNOSIS — M545 Low back pain, unspecified: Secondary | ICD-10-CM | POA: Diagnosis not present

## 2020-09-22 DIAGNOSIS — G8929 Other chronic pain: Secondary | ICD-10-CM | POA: Insufficient documentation

## 2020-09-22 DIAGNOSIS — R2681 Unsteadiness on feet: Secondary | ICD-10-CM | POA: Diagnosis not present

## 2020-09-22 NOTE — Therapy (Signed)
[facility] [place], [place], [address] Phone: [phone]   Fax:  [phone]  Physical Therapy Evaluation  Patient Details  Name: [name] MRN: [id number] Date of Birth: 04-20-1946 Referring Provider (PT): Duffy Rhody MD   Encounter Date: 09/22/2020   PT End of Session - 09/22/20 1136    Visit Number 1    Number of Visits 16    Date for PT Re-Evaluation 12/21/20    Authorization Type FOTO.    PT Start Time 1034    PT Stop Time 1125    PT Time Calculation (min) 51 min    Activity Tolerance Patient tolerated treatment well    Behavior During Therapy WFL for tasks assessed/performed           Past Medical History:  Diagnosis Date  . Acute pyelonephritis 10/15/2019  . Acute renal failure superimposed on stage 2 chronic kidney disease ([place]) 10/15/2019  . Alcoholism ([place]) 01/09/2012  . Anemia   . Anxiety   . Arthritis    Left knee  . B12 deficiency   . Carotid artery narrowing 10/03/2012   On the right.  . Chronic kidney disease (CKD), stage II (mild)   . COPD (chronic obstructive pulmonary disease) ([place])   . Depression   . Headache(784.0)   . Hyperlipidemia   . Hypertension   . Hyponatremia 01/09/2012  . Hypothyroid   . Menopause   . Mini stroke ([place])   . Orthostatic syncope 10/02/2012  . Pneumonia   . SBO (small bowel obstruction) ([place]) 06/28/2016  . Shortness of breath   . Stroke [facility]) 2010   no deficits  . Thrombocytopenia ([place]) 10/15/2019    Past Surgical History:  Procedure Laterality Date  . AGILE CAPSULE N/A 02/22/2016   dummy capsule remained in the distal ileum  . APPENDECTOMY    . CATARACT EXTRACTION W/PHACO  06/20/2012   Procedure: CATARACT EXTRACTION PHACO AND INTRAOCULAR LENS PLACEMENT (IOC);  Surgeon: Tonny Branch, MD;  Location: [facility];  Service: Ophthalmology;  Laterality: Right;  CDE:10.66  . CATARACT EXTRACTION W/PHACO  07/11/2012   Procedure: CATARACT EXTRACTION PHACO AND  INTRAOCULAR LENS PLACEMENT (IOC);  Surgeon: Tonny Branch, MD;  Location: [facility];  Service: Ophthalmology;  Laterality: Left;  CDE: 12.69  . COLONOSCOPY N/A 12/17/2015   RMR: Marked circumferential ulceration of the ascending colon/Ileocecal valve ulceration with biopsy most consistent with NSAID injury versus inflammatory bowel disease, pancolonoc diverticulosis redundant colon  . ESOPHAGOGASTRODUODENOSCOPY N/A 12/17/2015   [id number] gastric polyp, biopsy benign  . KNEE ARTHROSCOPY Right 09/2016  . LUMBAR LAMINECTOMY/DECOMPRESSION MICRODISCECTOMY N/A 05/25/2017   Procedure: CENTRAL DECOMPRESSIVE LUMBAR LAMINECTOMY FOR SPINAL STENOSIS L3-L4, FORAMINOTOMY FOR THE L4 ROOT AND L5 ROOT;  Surgeon: Latanya Maudlin, MD;  Location: [facility];  Service: Orthopedics;  Laterality: N/A;  . TONSILLECTOMY      There were no vitals filed for this visit.    Subjective Assessment - 09/22/20 1144    Subjective COVID-19 screen performed prior to patient entering clinic.  The patient presents to the clinic today with c/o signifciant LBP rated at an 8/10.  She presented to the clinic using a straight cane on the left and HHA of friend on right.  She is looking into a walker that will provide her with more stability which is certainly recommended.  She has a TLSO donned today which she has had for a couple of days.  She underwent an L3 Kyphoplasty in July of this year due to a  compression fracture.  Sitting in a comfortable chair decreases her pain and movement decreases her pain.    Pertinent History Osteopenia, OA, right knee scope, previous lumbar laminectomy, stroke, TIA.    How long can you stand comfortably? 5-10 minutes.    How long can you walk comfortably? Around home.    Diagnostic tests See 'Imaging" tab.    Patient Stated Goals Walk and move better.  Increase quality of life.  Decrease pain.    Currently in Pain? Yes    Pain Score 8     Pain Location Back    Pain Orientation Right;Left    Pain Descriptors /  Indicators Aching;Discomfort;Throbbing    Pain Type Chronic pain    Pain Onset More than a month ago    Pain Frequency Constant    Aggravating Factors  See above.    Pain Relieving Factors See above.              [facility] PT Assessment - 09/22/20 0001      Assessment   Medical Diagnosis Compression fracture of L3.    Referring Provider (PT) Duffy Rhody MD    Onset Date/Surgical Date --   Ongoing.     Precautions   Precautions Fall    Precaution Comments Be with patient/HHA at all times.      Restrictions   Weight Bearing Restrictions No      Balance Screen   Has the patient fallen in the past 6 months Yes    How many times? --   ED visit after a fall on 06/30/20.     [place] residence      Prior Function   Level of Independence Independent      Observation/Other Assessments   Focus on Therapeutic Outcomes (FOTO)  81% limitation.      Posture/Postural Control   Posture/Postural Control Postural limitations    Postural Limitations Rounded Shoulders;Forward head;Decreased lumbar lordosis;Flexed trunk    Posture Comments Left knee genu valgum.      Deep Tendon Reflexes   DTR Assessment Site Patella;Achilles    Patella DTR 1+    Achilles DTR 0      ROM / Strength   AROM / PROM / Strength AROM;Strength      AROM   Overall AROM Comments Deferred lumbar range of motion testing due to very high pain-level.  Bilateral LE ROM is WFL.      Strength   Overall Strength Comments Right quads= 4+/5, Left quads= 4-/5.  Bilateral ankel strength is 4+/5.      Palpation   Palpation comment Diffuse tender over patient's bilateral lumbar region.  Her lumbar musculature is very taut.      Transfers   Comments Sit to stand with definite use of armrests and CGA.        Ambulation/Gait   Gait Comments The patient walking in spinal flexion with her TLSO donned with her straight cane on the left and HHA on right.  Patient's gait is slow and  cautious with decreased step/stride length.                      Objective measurements completed on examination: See above findings.       [facility] Adult PT Treatment/Exercise - 09/22/20 0001      Modalities   Modalities Electrical Stimulation;Moist Heat      Moist Heat Therapy   Number Minutes Moist Heat 20 Minutes  Moist Heat Location Lumbar Spine      Electrical Stimulation   Electrical Stimulation Location Lumbar.    Electrical Stimulation Action IFC    Electrical Stimulation Parameters 80-150 Hz x 20 minutes.    Electrical Stimulation Goals Tone;Pain                       PT Long Term Goals - 09/22/20 1222      PT LONG TERM GOAL #1   Title I with HEP    Time 8    Period Weeks    Status New      PT LONG TERM GOAL #2   Title decreased pain with ADLS to 1-0/93 or less.    Time 8    Period Weeks    Status New      PT LONG TERM GOAL #3   Title Stand 15 minutes with pain not > 3-4/10.    Time 8    Period Weeks    Status New      PT LONG TERM GOAL #4   Title Walk a community distance with pain not > 4/10.    Time 8    Period Weeks    Status New                  Plan - 09/22/20 1209    Clinical Impression Statement The patient presents to OPPT s/p L3 Kyphoplasty performed in July of this year.  She has her TLSO donned.  She presents with a high pain-level.  Her functional mobility is impaired and her FOTO limitation score is rated at an 81% limitation.  She has diffuse bilateral lumbar pain and her musculature is very taut to palpation.  She walks with a straight cane and HHA though it is recommed she use a walker for additional safety at this time.  Patient will benefit from skilled physical therapy intervention to address deficits and pain.    Personal Factors and Comorbidities Comorbidity 1;Comorbidity 2    Comorbidities Osteopenia, OA, right knee scope, previous lumbar laminectomy, stroke, TIA.    Examination-Activity  Limitations Locomotion Level;Other;Transfers    Examination-Participation Restrictions Other    Stability/Clinical Decision Making Evolving/Moderate complexity    Clinical Decision Making Moderate    Rehab Potential Good    PT Frequency 2x / week    PT Duration 8 weeks    PT Treatment/Interventions ADLs/Self Care Home Management;Cryotherapy;Electrical Stimulation;Ultrasound;Moist Heat;Therapeutic activities;Therapeutic exercise;Functional mobility training;Neuromuscular re-education;Manual techniques;Patient/family education;Dry needling    PT Next Visit Plan Modalites and STW/M to lumbar region.  Functional mobility.  Personnel officer. Nustep.  Parallel bar activities.    Consulted and Agree with Plan of Care Patient           Patient will benefit from skilled therapeutic intervention in order to improve the following deficits and impairments:  Pain, Decreased activity tolerance, Increased muscle spasms, Postural dysfunction, Decreased strength, Abnormal gait, Difficulty walking  Visit Diagnosis: Chronic bilateral low back pain, unspecified whether sciatica present - Plan: PT plan of care cert/re-cert  Abnormal posture - Plan: PT plan of care cert/re-cert  Unsteadiness on feet - Plan: PT plan of care cert/re-cert     Problem List Patient Active Problem List   Diagnosis Date Noted  . Ileitis   . Arthritis   . Hyponatremia 04/21/2019  . Spinal stenosis, lumbar region with neurogenic claudication 05/25/2017  . Sinus tachycardia 06/29/2016  . Transaminitis 03/10/2016  . Pelvic mass 01/27/2016  . Colonic ulcer   .  Diverticulosis of colon without hemorrhage   . Iron deficiency anemia due to chronic blood loss   . Lumbar pain 12/14/2015  . Chronic kidney disease (CKD), stage II (mild) 10/03/2012  . Carotid artery narrowing 10/03/2012  . Hypokalemia 10/02/2012  . Hypothyroidism 10/02/2012  . Essential hypertension 01/09/2012  . Hyperlipidemia 01/09/2012  . B12 deficiency  01/09/2012    Dezmon Conover, [place] MPT 09/22/2020, 12:25 PM  [facility] [address] [place], [place], [address] Phone: [phone]   Fax:  [phone]  Name: [name] MRN: [id number] Date of Birth: [birth year]

## 2020-09-24 ENCOUNTER — Ambulatory Visit: Payer: Medicare HMO | Admitting: Physical Therapy

## 2020-09-24 DIAGNOSIS — R2681 Unsteadiness on feet: Secondary | ICD-10-CM | POA: Diagnosis not present

## 2020-09-24 DIAGNOSIS — G8929 Other chronic pain: Secondary | ICD-10-CM | POA: Diagnosis not present

## 2020-09-24 DIAGNOSIS — M545 Low back pain, unspecified: Secondary | ICD-10-CM | POA: Diagnosis not present

## 2020-09-24 DIAGNOSIS — R293 Abnormal posture: Secondary | ICD-10-CM | POA: Diagnosis not present

## 2020-09-24 NOTE — Therapy (Signed)
[facility] [place], [place], [address] Phone: [phone]   Fax:  [phone]  Physical Therapy Treatment  Patient Details  Name: [name] MRN: [id number] Date of Birth: [birth year] Referring Provider (PT): Duffy Rhody MD   Encounter Date: 09/24/2020   PT End of Session - 09/24/20 1238    Visit Number 2    Number of Visits 16    Date for PT Re-Evaluation 12/21/20    Authorization Type FOTO.    PT Start Time 0907    PT Stop Time 1000    PT Time Calculation (min) 53 min           Past Medical History:  Diagnosis Date  . Acute pyelonephritis 10/15/2019  . Acute renal failure superimposed on stage 2 chronic kidney disease ([place]) 10/15/2019  . Alcoholism ([place]) 01/09/2012  . Anemia   . Anxiety   . Arthritis    Left knee  . B12 deficiency   . Carotid artery narrowing 10/03/2012   On the right.  . Chronic kidney disease (CKD), stage II (mild)   . COPD (chronic obstructive pulmonary disease) ([place])   . Depression   . Headache(784.0)   . Hyperlipidemia   . Hypertension   . Hyponatremia 01/09/2012  . Hypothyroid   . Menopause   . Mini stroke ([place])   . Orthostatic syncope 10/02/2012  . Pneumonia   . SBO (small bowel obstruction) ([place]) 06/28/2016  . Shortness of breath   . Stroke [facility]) 2010   no deficits  . Thrombocytopenia ([place]) 10/15/2019    Past Surgical History:  Procedure Laterality Date  . AGILE CAPSULE N/A 02/22/2016   dummy capsule remained in the distal ileum  . APPENDECTOMY    . CATARACT EXTRACTION W/PHACO  06/20/2012   Procedure: CATARACT EXTRACTION PHACO AND INTRAOCULAR LENS PLACEMENT (IOC);  Surgeon: Tonny Branch, MD;  Location: [facility];  Service: Ophthalmology;  Laterality: Right;  CDE:10.66  . CATARACT EXTRACTION W/PHACO  07/11/2012   Procedure: CATARACT EXTRACTION PHACO AND INTRAOCULAR LENS PLACEMENT (IOC);  Surgeon: Tonny Branch, MD;  Location: [facility];  Service: Ophthalmology;  Laterality: Left;   CDE: 12.69  . COLONOSCOPY N/A 12/17/2015   RMR: Marked circumferential ulceration of the ascending colon/Ileocecal valve ulceration with biopsy most consistent with NSAID injury versus inflammatory bowel disease, pancolonoc diverticulosis redundant colon  . ESOPHAGOGASTRODUODENOSCOPY N/A 12/17/2015   [id number] gastric polyp, biopsy benign  . KNEE ARTHROSCOPY Right 09/2016  . LUMBAR LAMINECTOMY/DECOMPRESSION MICRODISCECTOMY N/A 05/25/2017   Procedure: CENTRAL DECOMPRESSIVE LUMBAR LAMINECTOMY FOR SPINAL STENOSIS L3-L4, FORAMINOTOMY FOR THE L4 ROOT AND L5 ROOT;  Surgeon: Latanya Maudlin, MD;  Location: [facility];  Service: Orthopedics;  Laterality: N/A;  . TONSILLECTOMY      There were no vitals filed for this visit.   Subjective Assessment - 09/24/20 1200    Subjective COVID-19 screen performed prior to patient entering clinic. No new complaints.    Pertinent History Osteopenia, OA, right knee scope, previous lumbar laminectomy, stroke, TIA.    How long can you stand comfortably? 5-10 minutes.    How long can you walk comfortably? Around home.    Diagnostic tests See 'Imaging" tab.    Patient Stated Goals Walk and move better.  Increase quality of life.  Decrease pain.    Currently in Pain? Yes    Pain Score 8     Pain Location Back    Pain Orientation Right;Left    Pain Descriptors / Indicators Aching;Discomfort;Throbbing  Pain Type Chronic pain    Pain Onset More than a month ago                             [facility] Adult PT Treatment/Exercise - 09/24/20 0001      Modalities   Modalities Electrical Stimulation;Moist Heat;Ultrasound      Moist Heat Therapy   Number Minutes Moist Heat 20 Minutes    Moist Heat Location Lumbar Spine      Electrical Stimulation   Electrical Stimulation Location Lumbar    Electrical Stimulation Action IFC    Electrical Stimulation Parameters 80-150 Hz x 20 minutes    Electrical Stimulation Goals Tone;Pain      Ultrasound    Ultrasound Location Seated:  Combo e'stim/US at 1.50 W/CM2 x 12 minutes to her affected lumbar region.      Manual Therapy   Manual Therapy Soft tissue mobilization    Soft tissue mobilization STW/M x 12 minutes to patient's affected lumbar musculature.                       PT Long Term Goals - 09/22/20 1222      PT LONG TERM GOAL #1   Title I with HEP    Time 8    Period Weeks    Status New      PT LONG TERM GOAL #2   Title decreased pain with ADLS to 1-0/62 or less.    Time 8    Period Weeks    Status New      PT LONG TERM GOAL #3   Title Stand 15 minutes with pain not > 3-4/10.    Time 8    Period Weeks    Status New      PT LONG TERM GOAL #4   Title Walk a community distance with pain not > 4/10.    Time 8    Period Weeks    Status New                 Plan - 09/24/20 1241    Clinical Impression Statement Patient with continued low back pain but did well with treatment today.    Comorbidities Osteopenia, OA, right knee scope, previous lumbar laminectomy, stroke, TIA.    Examination-Activity Limitations Locomotion Level;Other;Transfers    Examination-Participation Restrictions Other    Stability/Clinical Decision Making Evolving/Moderate complexity    Rehab Potential Good    PT Frequency 2x / week    PT Duration 8 weeks    PT Treatment/Interventions ADLs/Self Care Home Management;Cryotherapy;Electrical Stimulation;Ultrasound;Moist Heat;Therapeutic activities;Therapeutic exercise;Functional mobility training;Neuromuscular re-education;Manual techniques;Patient/family education;Dry needling    PT Next Visit Plan Modalites and STW/M to lumbar region.  Functional mobility.  Personnel officer. Nustep.  Parallel bar activities.    Consulted and Agree with Plan of Care Patient           Patient will benefit from skilled therapeutic intervention in order to improve the following deficits and impairments:  Pain, Decreased activity tolerance, Increased  muscle spasms, Postural dysfunction, Decreased strength, Abnormal gait, Difficulty walking  Visit Diagnosis: Chronic bilateral low back pain, unspecified whether sciatica present  Abnormal posture  Unsteadiness on feet     Problem List Patient Active Problem List   Diagnosis Date Noted  . Ileitis   . Arthritis   . Hyponatremia 04/21/2019  . Spinal stenosis, lumbar region with neurogenic claudication 05/25/2017  . Sinus tachycardia 06/29/2016  .  Transaminitis 03/10/2016  . Pelvic mass 01/27/2016  . Colonic ulcer   . Diverticulosis of colon without hemorrhage   . Iron deficiency anemia due to chronic blood loss   . Lumbar pain 12/14/2015  . Chronic kidney disease (CKD), stage II (mild) 10/03/2012  . Carotid artery narrowing 10/03/2012  . Hypokalemia 10/02/2012  . Hypothyroidism 10/02/2012  . Essential hypertension 01/09/2012  . Hyperlipidemia 01/09/2012  . B12 deficiency 01/09/2012    Micaiah Remillard, [place] MPT 09/24/2020, 12:42 PM  [facility] [address] [place], [place], [address] Phone: [phone]   Fax:  [phone]  Name: ASHLE STIEF MRN: [id number] Date of Birth: 12-Sep-1946

## 2020-09-27 ENCOUNTER — Other Ambulatory Visit: Payer: Self-pay

## 2020-09-27 ENCOUNTER — Ambulatory Visit: Payer: Medicare HMO | Admitting: Physical Therapy

## 2020-09-27 ENCOUNTER — Encounter: Payer: Self-pay | Admitting: Physical Therapy

## 2020-09-27 DIAGNOSIS — M545 Low back pain, unspecified: Secondary | ICD-10-CM

## 2020-09-27 DIAGNOSIS — R293 Abnormal posture: Secondary | ICD-10-CM | POA: Diagnosis not present

## 2020-09-27 DIAGNOSIS — R2681 Unsteadiness on feet: Secondary | ICD-10-CM

## 2020-09-27 DIAGNOSIS — G8929 Other chronic pain: Secondary | ICD-10-CM

## 2020-09-27 NOTE — Therapy (Signed)
[facility] [place], [place], [address] Phone: [phone]   Fax:  [phone]  Physical Therapy Treatment  Patient Details  Name: [name] MRN: [id number] Date of Birth: November 09, 1946 Referring Provider (PT): Duffy Rhody MD   Encounter Date: 09/27/2020   PT End of Session - 09/27/20 1046    Visit Number 3    Number of Visits 16    Date for PT Re-Evaluation 12/21/20    Authorization Type FOTO.    PT Start Time [phone]    PT Stop Time 1032    PT Time Calculation (min) 45 min    Activity Tolerance Patient tolerated treatment well    Behavior During Therapy WFL for tasks assessed/performed           Past Medical History:  Diagnosis Date  . Acute pyelonephritis 10/15/2019  . Acute renal failure superimposed on stage 2 chronic kidney disease ([place]) 10/15/2019  . Alcoholism ([place]) 01/09/2012  . Anemia   . Anxiety   . Arthritis    Left knee  . B12 deficiency   . Carotid artery narrowing 10/03/2012   On the right.  . Chronic kidney disease (CKD), stage II (mild)   . COPD (chronic obstructive pulmonary disease) ([place])   . Depression   . Headache(784.0)   . Hyperlipidemia   . Hypertension   . Hyponatremia 01/09/2012  . Hypothyroid   . Menopause   . Mini stroke ([place])   . Orthostatic syncope 10/02/2012  . Pneumonia   . SBO (small bowel obstruction) ([place]) 06/28/2016  . Shortness of breath   . Stroke [facility]) 2010   no deficits  . Thrombocytopenia ([place]) 10/15/2019    Past Surgical History:  Procedure Laterality Date  . AGILE CAPSULE N/A 02/22/2016   dummy capsule remained in the distal ileum  . APPENDECTOMY    . CATARACT EXTRACTION W/PHACO  06/20/2012   Procedure: CATARACT EXTRACTION PHACO AND INTRAOCULAR LENS PLACEMENT (IOC);  Surgeon: Tonny Branch, MD;  Location: [facility];  Service: Ophthalmology;  Laterality: Right;  CDE:10.66  . CATARACT EXTRACTION W/PHACO  07/11/2012   Procedure: CATARACT EXTRACTION PHACO AND  INTRAOCULAR LENS PLACEMENT (IOC);  Surgeon: Tonny Branch, MD;  Location: [facility];  Service: Ophthalmology;  Laterality: Left;  CDE: 12.69  . COLONOSCOPY N/A 12/17/2015   RMR: Marked circumferential ulceration of the ascending colon/Ileocecal valve ulceration with biopsy most consistent with NSAID injury versus inflammatory bowel disease, pancolonoc diverticulosis redundant colon  . ESOPHAGOGASTRODUODENOSCOPY N/A 12/17/2015   [id number] gastric polyp, biopsy benign  . KNEE ARTHROSCOPY Right 09/2016  . LUMBAR LAMINECTOMY/DECOMPRESSION MICRODISCECTOMY N/A 05/25/2017   Procedure: CENTRAL DECOMPRESSIVE LUMBAR LAMINECTOMY FOR SPINAL STENOSIS L3-L4, FORAMINOTOMY FOR THE L4 ROOT AND L5 ROOT;  Surgeon: Latanya Maudlin, MD;  Location: [facility];  Service: Orthopedics;  Laterality: N/A;  . TONSILLECTOMY      There were no vitals filed for this visit.   Subjective Assessment - 09/27/20 1045    Subjective COVID-19 screen performed prior to patient entering clinic. Patient reported "My back hurts like you know what"    Pertinent History Osteopenia, OA, right knee scope, previous lumbar laminectomy, stroke, TIA.    How long can you stand comfortably? 5-10 minutes.    How long can you walk comfortably? Around home.    Diagnostic tests See 'Imaging" tab.    Patient Stated Goals Walk and move better.  Increase quality of life.  Decrease pain.    Currently in Pain? Yes    Pain Score  10-Worst pain ever    Pain Location Back    Pain Orientation Right;Left;Lower    Pain Descriptors / Indicators Aching;Discomfort    Pain Type Chronic pain    Pain Onset More than a month ago    Pain Frequency Constant              [facility] PT Assessment - 09/27/20 0001      Assessment   Medical Diagnosis Compression fracture of L3.    Referring Provider (PT) Duffy Rhody MD      Precautions   Precautions Fall    Precaution Comments Be with patient/HHA at all times.      Restrictions   Weight Bearing Restrictions No                          [facility] Adult PT Treatment/Exercise - 09/27/20 0001      Modalities   Modalities Electrical Stimulation;Moist Heat;Ultrasound      Moist Heat Therapy   Number Minutes Moist Heat 10 Minutes    Moist Heat Location Lumbar Spine      Electrical Stimulation   Electrical Stimulation Location lumbar    Electrical Stimulation Action IFC    Electrical Stimulation Parameters 80-150 hz x10 mins    Electrical Stimulation Goals Tone;Pain      Ultrasound   Ultrasound Location bilateral lumbar paraspinals and QLs    Ultrasound Parameters 1.5 w/cm2 100% 1 mhz x12 mins    Ultrasound Goals Pain      Manual Therapy   Manual Therapy Soft tissue mobilization    Soft tissue mobilization STW to bilateral lumbar paraspinals and QL in sitting to decrease pain and tone                  PT Education - 09/27/20 1046    Education Details draw ins    Person(s) Educated Patient    Methods Explanation;Demonstration    Comprehension Verbalized understanding;Returned demonstration               PT Long Term Goals - 09/22/20 1222      PT LONG TERM GOAL #1   Title I with HEP    Time 8    Period Weeks    Status New      PT LONG TERM GOAL #2   Title decreased pain with ADLS to 9-9/24 or less.    Time 8    Period Weeks    Status New      PT LONG TERM GOAL #3   Title Stand 15 minutes with pain not > 3-4/10.    Time 8    Period Weeks    Status New      PT LONG TERM GOAL #4   Title Walk a community distance with pain not > 4/10.    Time 8    Period Weeks    Status New                 Plan - 09/27/20 1046    Clinical Impression Statement Patient arrived in a wheel chair due to the inability to walk because of high levels of low back pain. Session focused on conservative treatment in which she reported some incease of low back pain with sitting. Patient with moderate tone to bilateral lumbar paraspsinals and QLs. Patient educated on draw in  exercise for core strengthening. Patient demonstrated exercises well and was instructed she can perform in various positions with brace donned.  No adverse affects upon removal of modalities. Patient required assistance for donning brace.    Personal Factors and Comorbidities Comorbidity 1;Comorbidity 2    Comorbidities Osteopenia, OA, right knee scope, previous lumbar laminectomy, stroke, TIA.    Examination-Activity Limitations Locomotion Level;Other;Transfers    Examination-Participation Restrictions Other    Stability/Clinical Decision Making Evolving/Moderate complexity    Clinical Decision Making Moderate    Rehab Potential Good    PT Frequency 2x / week    PT Duration 8 weeks    PT Treatment/Interventions ADLs/Self Care Home Management;Cryotherapy;Electrical Stimulation;Ultrasound;Moist Heat;Therapeutic activities;Therapeutic exercise;Functional mobility training;Neuromuscular re-education;Manual techniques;Patient/family education;Dry needling    PT Next Visit Plan Modalites and STW/M to lumbar region.  Functional mobility.  Personnel officer. Nustep.  Parallel bar activities.    Consulted and Agree with Plan of Care Patient           Patient will benefit from skilled therapeutic intervention in order to improve the following deficits and impairments:  Pain, Decreased activity tolerance, Increased muscle spasms, Postural dysfunction, Decreased strength, Abnormal gait, Difficulty walking  Visit Diagnosis: Chronic bilateral low back pain, unspecified whether sciatica present  Abnormal posture  Unsteadiness on feet     Problem List Patient Active Problem List   Diagnosis Date Noted  . Ileitis   . Arthritis   . Hyponatremia 04/21/2019  . Spinal stenosis, lumbar region with neurogenic claudication 05/25/2017  . Sinus tachycardia 06/29/2016  . Transaminitis 03/10/2016  . Pelvic mass 01/27/2016  . Colonic ulcer   . Diverticulosis of colon without hemorrhage   . Iron deficiency  anemia due to chronic blood loss   . Lumbar pain 12/14/2015  . Chronic kidney disease (CKD), stage II (mild) 10/03/2012  . Carotid artery narrowing 10/03/2012  . Hypokalemia 10/02/2012  . Hypothyroidism 10/02/2012  . Essential hypertension 01/09/2012  . Hyperlipidemia 01/09/2012  . B12 deficiency 01/09/2012    Gabriela Eves, PT, DPT 09/27/2020, 10:52 AM  [facility] [address] [place], [place], [address] Phone: [phone]   Fax:  [phone]  Name: UVA RUNKEL MRN: [id number] Date of Birth: 10/13/1946

## 2020-09-29 ENCOUNTER — Ambulatory Visit: Payer: Medicare HMO

## 2020-09-30 ENCOUNTER — Encounter: Payer: Self-pay | Admitting: Family Medicine

## 2020-09-30 ENCOUNTER — Ambulatory Visit: Payer: Medicare HMO | Admitting: Physical Therapy

## 2020-09-30 ENCOUNTER — Other Ambulatory Visit: Payer: Self-pay

## 2020-09-30 DIAGNOSIS — R293 Abnormal posture: Secondary | ICD-10-CM

## 2020-09-30 DIAGNOSIS — G8929 Other chronic pain: Secondary | ICD-10-CM

## 2020-09-30 DIAGNOSIS — M545 Low back pain, unspecified: Secondary | ICD-10-CM

## 2020-09-30 DIAGNOSIS — R2681 Unsteadiness on feet: Secondary | ICD-10-CM

## 2020-09-30 NOTE — Therapy (Signed)
[facility] [place], [place], [address] Phone: [phone]   Fax:  [phone]  Physical Therapy Treatment  Patient Details  Name: [name] MRN: [id number] Date of Birth: September 14, 1946 Referring Provider (PT): Duffy Rhody MD   Encounter Date: 09/30/2020   PT End of Session - 09/30/20 1111    Visit Number 4    Number of Visits 16    Date for PT Re-Evaluation 12/21/20    Authorization Type FOTO.    PT Start Time 1005   Patient arrived late   PT Stop Time 1053    PT Time Calculation (min) 48 min    Activity Tolerance Patient limited by pain    Behavior During Therapy [facility] for tasks assessed/performed           Past Medical History:  Diagnosis Date  . Acute pyelonephritis 10/15/2019  . Acute renal failure superimposed on stage 2 chronic kidney disease ([place]) 10/15/2019  . Alcoholism ([place]) 01/09/2012  . Anemia   . Anxiety   . Arthritis    Left knee  . B12 deficiency   . Carotid artery narrowing 10/03/2012   On the right.  . Chronic kidney disease (CKD), stage II (mild)   . COPD (chronic obstructive pulmonary disease) ([place])   . Depression   . Headache(784.0)   . Hyperlipidemia   . Hypertension   . Hyponatremia 01/09/2012  . Hypothyroid   . Menopause   . Mini stroke ([place])   . Orthostatic syncope 10/02/2012  . Pneumonia   . SBO (small bowel obstruction) ([place]) 06/28/2016  . Shortness of breath   . Stroke [facility]) 2010   no deficits  . Thrombocytopenia ([place]) 10/15/2019    Past Surgical History:  Procedure Laterality Date  . AGILE CAPSULE N/A 02/22/2016   dummy capsule remained in the distal ileum  . APPENDECTOMY    . CATARACT EXTRACTION W/PHACO  06/20/2012   Procedure: CATARACT EXTRACTION PHACO AND INTRAOCULAR LENS PLACEMENT (IOC);  Surgeon: Tonny Branch, MD;  Location: [facility];  Service: Ophthalmology;  Laterality: Right;  CDE:10.66  . CATARACT EXTRACTION W/PHACO  07/11/2012   Procedure: CATARACT EXTRACTION  PHACO AND INTRAOCULAR LENS PLACEMENT (IOC);  Surgeon: Tonny Branch, MD;  Location: [facility];  Service: Ophthalmology;  Laterality: Left;  CDE: 12.69  . COLONOSCOPY N/A 12/17/2015   RMR: Marked circumferential ulceration of the ascending colon/Ileocecal valve ulceration with biopsy most consistent with NSAID injury versus inflammatory bowel disease, pancolonoc diverticulosis redundant colon  . ESOPHAGOGASTRODUODENOSCOPY N/A 12/17/2015   [id number] gastric polyp, biopsy benign  . KNEE ARTHROSCOPY Right 09/2016  . LUMBAR LAMINECTOMY/DECOMPRESSION MICRODISCECTOMY N/A 05/25/2017   Procedure: CENTRAL DECOMPRESSIVE LUMBAR LAMINECTOMY FOR SPINAL STENOSIS L3-L4, FORAMINOTOMY FOR THE L4 ROOT AND L5 ROOT;  Surgeon: Latanya Maudlin, MD;  Location: [facility];  Service: Orthopedics;  Laterality: N/A;  . TONSILLECTOMY      There were no vitals filed for this visit.   Subjective Assessment - 09/30/20 1006    Subjective COVID-19 screen performed prior to patient entering clinic. Patient reported ongoing pain and had a fall yesterday when she went onto front porch and fell onto Lt knee    Pertinent History Osteopenia, OA, right knee scope, previous lumbar laminectomy, stroke, TIA.    How long can you stand comfortably? 5-10 minutes.    How long can you walk comfortably? Around home.    Diagnostic tests See 'Imaging" tab.    Patient Stated Goals Walk and move better.  Increase quality of life.  Decrease pain.    Currently in Pain? Yes    Pain Score 10-Worst pain ever    Pain Location Back    Pain Orientation Right;Left;Lower    Pain Descriptors / Indicators Discomfort    Pain Type Chronic pain    Pain Onset More than a month ago    Pain Frequency Constant    Aggravating Factors  movement    Pain Relieving Factors rest                             [facility] Adult PT Treatment/Exercise - 09/30/20 0001      Exercises   Exercises Lumbar      Lumbar Exercises: Standing   Other Standing Lumbar  Exercises Standing at mat table for core sets x26min   with brace on      Moist Heat Therapy   Number Minutes Moist Heat 15 Minutes    Moist Heat Location Lumbar Spine      Electrical Stimulation   Electrical Stimulation Location lumbar    Electrical Stimulation Action IFC    Electrical Stimulation Parameters 80-150hz  x87min    Electrical Stimulation Goals Tone;Pain      Ultrasound   Ultrasound Location Bil lumbar paraspinals    Ultrasound Parameters 1.5w/cm2/50%/77mhz x76min                  PT Education - 09/30/20 1118    Education Details Verbal HEP for core sets in sitting or standing at walker and to stand at walker with weight shifting and core sets every hour to improve stance time    Person(s) Educated Patient    Methods Explanation;Demonstration;Verbal cues    Comprehension Verbalized understanding;Returned demonstration               PT Long Term Goals - 09/30/20 1116      PT LONG TERM GOAL #1   Title I with HEP    Baseline Educated patient on low level HEP 09/30/20    Time 8    Period Weeks    Status On-going      PT LONG TERM GOAL #2   Title decreased pain with ADLS to 3-1/51 or less.    Baseline 10/10 pain today 09/30/20    Time 8    Period Weeks    Status On-going      PT LONG TERM GOAL #3   Title Stand 15 minutes with pain not > 3-4/10.    Baseline limited with standing less than 5 min 09/30/20    Time 8    Period Weeks    Status On-going      PT LONG TERM GOAL #4   Title Walk a community distance with pain not > 4/10.    Baseline unable to walk a community distance 09/30/20    Time 8    Period Weeks    Status On-going                 Plan - 09/30/20 1120    Clinical Impression Statement Patient tolerated treatment fair due to ongoing pain in back. Patient reported a fall yeterday on her porch using her cane.Today focused on education with the use of a walker that she has at home and to stand every hour with walker in front  and chair behind for safety, to perfrom weight shifting and core sets to improve mobility and standing tolerance. Today patient was able to stand for about  two min with exercise. Patient will bring walker next treatment to progress her with education and use of walker to avoid future falls. Patient current goals ongoing due to pain. Patient continues to wear her brace at all times.    Personal Factors and Comorbidities Comorbidity 1;Comorbidity 2    Comorbidities Osteopenia, OA, right knee scope, previous lumbar laminectomy, stroke, TIA.    Examination-Activity Limitations Locomotion Level;Other;Transfers    Examination-Participation Restrictions Other    Stability/Clinical Decision Making Evolving/Moderate complexity    Rehab Potential Good    PT Frequency 2x / week    PT Duration 8 weeks    PT Treatment/Interventions ADLs/Self Care Home Management;Cryotherapy;Electrical Stimulation;Ultrasound;Moist Heat;Therapeutic activities;Therapeutic exercise;Functional mobility training;Neuromuscular re-education;Manual techniques;Patient/family education;Dry needling    PT Next Visit Plan Modalites and STW/M to lumbar region.  Functional mobility.  Gait Training with walker standing and progress per tolerance with stairs and ramp. Nustep.  Parallel bar activities.    Consulted and Agree with Plan of Care Patient           Patient will benefit from skilled therapeutic intervention in order to improve the following deficits and impairments:  Pain, Decreased activity tolerance, Increased muscle spasms, Postural dysfunction, Decreased strength, Abnormal gait, Difficulty walking  Visit Diagnosis: Chronic bilateral low back pain, unspecified whether sciatica present  Abnormal posture  Unsteadiness on feet     Problem List Patient Active Problem List   Diagnosis Date Noted  . Ileitis   . Arthritis   . Hyponatremia 04/21/2019  . Spinal stenosis, lumbar region with neurogenic claudication 05/25/2017   . Sinus tachycardia 06/29/2016  . Transaminitis 03/10/2016  . Pelvic mass 01/27/2016  . Colonic ulcer   . Diverticulosis of colon without hemorrhage   . Iron deficiency anemia due to chronic blood loss   . Lumbar pain 12/14/2015  . Chronic kidney disease (CKD), stage II (mild) 10/03/2012  . Carotid artery narrowing 10/03/2012  . Hypokalemia 10/02/2012  . Hypothyroidism 10/02/2012  . Essential hypertension 01/09/2012  . Hyperlipidemia 01/09/2012  . B12 deficiency 01/09/2012    Phillips Climes, PTA 09/30/2020, 11:30 AM  [facility] [place], [place], [address] Phone: [phone]   Fax:  [phone]  Name: [name] MRN: [id number] Date of Birth: 07/31/1946

## 2020-10-05 ENCOUNTER — Encounter: Payer: Self-pay | Admitting: Physical Therapy

## 2020-10-05 ENCOUNTER — Other Ambulatory Visit: Payer: Self-pay

## 2020-10-05 ENCOUNTER — Ambulatory Visit: Payer: Medicare HMO | Admitting: Physical Therapy

## 2020-10-05 DIAGNOSIS — R293 Abnormal posture: Secondary | ICD-10-CM | POA: Diagnosis not present

## 2020-10-05 DIAGNOSIS — M545 Low back pain, unspecified: Secondary | ICD-10-CM | POA: Diagnosis not present

## 2020-10-05 DIAGNOSIS — R2681 Unsteadiness on feet: Secondary | ICD-10-CM

## 2020-10-05 DIAGNOSIS — G8929 Other chronic pain: Secondary | ICD-10-CM

## 2020-10-05 NOTE — Therapy (Addendum)
[facility] [place], [place], [address] Phone: [phone]   Fax:  [phone]  Physical Therapy Treatment PHYSICAL THERAPY DISCHARGE SUMMARY  Visits from Start of Care: 5  Current functional level related to goals / functional outcomes: See below   Remaining deficits: See goals   Education / Equipment: HEP Plan: Patient agrees to discharge.  Patient goals were not met. Patient is being discharged due to lack of progress.  ?????     Patient Details  Name: [name] MRN: [id number] Date of Birth: [birth year] Referring Provider (PT): Duffy Rhody MD   Encounter Date: 10/05/2020   PT End of Session - 10/05/20 0958    Visit Number 5    Number of Visits 16    Date for PT Re-Evaluation 12/21/20    Authorization Type FOTO.    PT Start Time [phone]    PT Stop Time 1034    PT Time Calculation (min) 45 min    Equipment Utilized During Treatment Gait belt;Other (comment)   WC, Rollator   Activity Tolerance Patient tolerated treatment well    Behavior During Therapy WFL for tasks assessed/performed           Past Medical History:  Diagnosis Date  . Acute pyelonephritis 10/15/2019  . Acute renal failure superimposed on stage 2 chronic kidney disease ([place]) 10/15/2019  . Alcoholism ([place]) 01/09/2012  . Anemia   . Anxiety   . Arthritis    Left knee  . B12 deficiency   . Carotid artery narrowing 10/03/2012   On the right.  . Chronic kidney disease (CKD), stage II (mild)   . COPD (chronic obstructive pulmonary disease) ([place])   . Depression   . Headache(784.0)   . Hyperlipidemia   . Hypertension   . Hyponatremia 01/09/2012  . Hypothyroid   . Menopause   . Mini stroke ([place])   . Orthostatic syncope 10/02/2012  . Pneumonia   . SBO (small bowel obstruction) ([place]) 06/28/2016  . Shortness of breath   . Stroke [facility]) 2010   no deficits  . Thrombocytopenia ([place]) 10/15/2019    Past Surgical History:  Procedure  Laterality Date  . AGILE CAPSULE N/A 02/22/2016   dummy capsule remained in the distal ileum  . APPENDECTOMY    . CATARACT EXTRACTION W/PHACO  06/20/2012   Procedure: CATARACT EXTRACTION PHACO AND INTRAOCULAR LENS PLACEMENT (IOC);  Surgeon: Tonny Branch, MD;  Location: [facility];  Service: Ophthalmology;  Laterality: Right;  CDE:10.66  . CATARACT EXTRACTION W/PHACO  07/11/2012   Procedure: CATARACT EXTRACTION PHACO AND INTRAOCULAR LENS PLACEMENT (IOC);  Surgeon: Tonny Branch, MD;  Location: [facility];  Service: Ophthalmology;  Laterality: Left;  CDE: 12.69  . COLONOSCOPY N/A 12/17/2015   RMR: Marked circumferential ulceration of the ascending colon/Ileocecal valve ulceration with biopsy most consistent with NSAID injury versus inflammatory bowel disease, pancolonoc diverticulosis redundant colon  . ESOPHAGOGASTRODUODENOSCOPY N/A 12/17/2015   KAJ:GOTLXBWIO gastric polyp, biopsy benign  . KNEE ARTHROSCOPY Right 09/2016  . LUMBAR LAMINECTOMY/DECOMPRESSION MICRODISCECTOMY N/A 05/25/2017   Procedure: CENTRAL DECOMPRESSIVE LUMBAR LAMINECTOMY FOR SPINAL STENOSIS L3-L4, FORAMINOTOMY FOR THE L4 ROOT AND L5 ROOT;  Surgeon: Latanya Maudlin, MD;  Location: [facility];  Service: Orthopedics;  Laterality: N/A;  . TONSILLECTOMY      There were no vitals filed for this visit.   Subjective Assessment - 10/05/20 0958    Subjective COVID-19 screen performed prior to patient entering clinic. Patient reports not using lumbar corset for two days but unable  due to intolerable constant pain. Patient    Pertinent History Osteopenia, OA, right knee scope, previous lumbar laminectomy, stroke, TIA.    How long can you stand comfortably? 5-10 minutes.    How long can you walk comfortably? Around home.    Diagnostic tests See 'Imaging" tab.    Patient Stated Goals Walk and move better.  Increase quality of life.  Decrease pain.    Currently in Pain? No/denies              [facility] PT Assessment - 10/05/20 0001      Assessment    Medical Diagnosis Compression fracture of L3.    Referring Provider (PT) Duffy Rhody MD      Precautions   Precautions Fall    Precaution Comments Be with patient/HHA at all times.      Restrictions   Weight Bearing Restrictions No                         [facility] Adult PT Treatment/Exercise - 10/05/20 0001      Ambulation/Gait   Ambulation/Gait Yes    Ambulation/Gait Assistance 6: Modified independent (Device/Increase time)    Ambulation Distance (Feet) 125 Feet    Assistive device Rollator    Gait Pattern Decreased stride length;Trunk flexed    Ambulation Surface Level;Indoor      Modalities   Modalities Electrical Stimulation;Ultrasound;Moist Heat      Moist Heat Therapy   Number Minutes Moist Heat 15 Minutes    Moist Heat Location Lumbar Spine      Electrical Stimulation   Electrical Stimulation Location B low back    Electrical Stimulation Action IFC    Electrical Stimulation Parameters 80-150 hz x15 min    Electrical Stimulation Goals Tone;Pain      Ultrasound   Ultrasound Location R lumbar paraspinals, QL    Ultrasound Parameters Combo 1.5 w/cm2, 100%, 1 mhz x10 min    Ultrasound Goals Pain      Manual Therapy   Manual Therapy Soft tissue mobilization    Soft tissue mobilization STW to R Ql, lumbar paraspinals to reduce tightness and pain                       PT Long Term Goals - 09/30/20 1116      PT LONG TERM GOAL #1   Title I with HEP    Baseline Educated patient on low level HEP 09/30/20    Time 8    Period Weeks    Status On-going      PT LONG TERM GOAL #2   Title decreased pain with ADLS to 1-6/10 or less.    Baseline 10/10 pain today 09/30/20    Time 8    Period Weeks    Status On-going      PT LONG TERM GOAL #3   Title Stand 15 minutes with pain not > 3-4/10.    Baseline limited with standing less than 5 min 09/30/20    Time 8    Period Weeks    Status On-going      PT LONG TERM GOAL #4   Title Walk a  community distance with pain not > 4/10.    Baseline unable to walk a community distance 09/30/20    Time 8    Period Weeks    Status On-going                 Plan -  10/05/20 1157    Clinical Impression Statement Patient presented in clinic with reports of no use of lumbar corset brace for two days as she has intolerable pain with brace donned. Patient did bring in rollator and [facility] for gait training as she reports she does not know how to use rollator. Patient able to tolerate gait training with rollator without LBP and with WNL. Patient did present with increased tone palpable in B lumbar paraspinals and QL especially with R QL. Normal modalities response noted following removal of the modalities. Patient provided extensive education regarding safety with WC and rollator for brakes and gait sequencing. Patient verbalized understanding of instructions.    Personal Factors and Comorbidities Comorbidity 1;Comorbidity 2    Comorbidities Osteopenia, OA, right knee scope, previous lumbar laminectomy, stroke, TIA.    Examination-Activity Limitations Locomotion Level;Other;Transfers    Examination-Participation Restrictions Other    Stability/Clinical Decision Making Evolving/Moderate complexity    Rehab Potential Good    PT Frequency 2x / week    PT Duration 8 weeks    PT Treatment/Interventions ADLs/Self Care Home Management;Cryotherapy;Electrical Stimulation;Ultrasound;Moist Heat;Therapeutic activities;Therapeutic exercise;Functional mobility training;Neuromuscular re-education;Manual techniques;Patient/family education;Dry needling    PT Next Visit Plan Modalites and STW/M to lumbar region.  Functional mobility.  Gait Training with walker standing and progress per tolerance with stairs and ramp. Nustep.  Parallel bar activities.    Consulted and Agree with Plan of Care Patient           Patient will benefit from skilled therapeutic intervention in order to improve the following deficits  and impairments:  Pain, Decreased activity tolerance, Increased muscle spasms, Postural dysfunction, Decreased strength, Abnormal gait, Difficulty walking  Visit Diagnosis: Chronic bilateral low back pain, unspecified whether sciatica present  Abnormal posture  Unsteadiness on feet     Problem List Patient Active Problem List   Diagnosis Date Noted  . Ileitis   . Arthritis   . Hyponatremia 04/21/2019  . Spinal stenosis, lumbar region with neurogenic claudication 05/25/2017  . Sinus tachycardia 06/29/2016  . Transaminitis 03/10/2016  . Pelvic mass 01/27/2016  . Colonic ulcer   . Diverticulosis of colon without hemorrhage   . Iron deficiency anemia due to chronic blood loss   . Lumbar pain 12/14/2015  . Chronic kidney disease (CKD), stage II (mild) 10/03/2012  . Carotid artery narrowing 10/03/2012  . Hypokalemia 10/02/2012  . Hypothyroidism 10/02/2012  . Essential hypertension 01/09/2012  . Hyperlipidemia 01/09/2012  . B12 deficiency 01/09/2012    Standley Brooking, PTA 10/05/2020, 12:08 PM  [facility] [address] [place], [place], [address] Phone: [phone]   Fax:  [phone]  Name: [name] MRN: [id number] Date of Birth: 12-30-1945

## 2020-10-07 ENCOUNTER — Ambulatory Visit: Payer: Medicare HMO | Admitting: Physical Therapy

## 2020-10-07 ENCOUNTER — Other Ambulatory Visit: Payer: Self-pay

## 2020-10-07 NOTE — Therapy (Signed)
[facility] [place], [place], [address] Phone: [phone]   Fax:  [phone]  Patient Details  Name: [name] MRN: [id number] Date of Birth: 1946/02/26 Referring Provider:  Claretta Fraise, MD  Encounter Date: 10/07/2020  Pt arriving to therapy today for treatment session in tears reporting no change in her pain since her initial evaluation. Pt has made progress with her functional mobility with progressing from wheelchair to amb with a straight cane.  Pt still reporting 10/10 pain in her R low back. I discussed treatment options and further therapy interventions and pt reported that nothing seems to help. I recommended pt follow up with Dr. Marcello Moores who was her referring provider and pt preferring to go back to her PCP. I am placing pt on hold until she follow up with her MD.   Oretha Caprice, PT, MPT 10/07/2020, 10:08 AM  [facility] [place], [place], [address] Phone: [phone]   Fax:  [phone]

## 2020-10-15 ENCOUNTER — Telehealth: Payer: Medicare HMO

## 2020-11-02 ENCOUNTER — Ambulatory Visit (INDEPENDENT_AMBULATORY_CARE_PROVIDER_SITE_OTHER): Payer: Medicare HMO | Admitting: Family Medicine

## 2020-11-02 ENCOUNTER — Encounter: Payer: Self-pay | Admitting: Family Medicine

## 2020-11-02 ENCOUNTER — Other Ambulatory Visit: Payer: Self-pay

## 2020-11-02 VITALS — BP 133/80 | HR 90 | Temp 97.6°F | Resp 20 | Ht 68.0 in | Wt 150.0 lb

## 2020-11-02 DIAGNOSIS — M48062 Spinal stenosis, lumbar region with neurogenic claudication: Secondary | ICD-10-CM | POA: Diagnosis not present

## 2020-11-02 DIAGNOSIS — I10 Essential (primary) hypertension: Secondary | ICD-10-CM

## 2020-11-02 DIAGNOSIS — M5416 Radiculopathy, lumbar region: Secondary | ICD-10-CM | POA: Diagnosis not present

## 2020-11-02 DIAGNOSIS — E038 Other specified hypothyroidism: Secondary | ICD-10-CM

## 2020-11-02 DIAGNOSIS — E782 Mixed hyperlipidemia: Secondary | ICD-10-CM

## 2020-11-02 MED ORDER — CALCITONIN (SALMON) 200 UNIT/ACT NA SOLN: 1.0000 | Freq: Every day | NASAL | 12 refills | Status: DC

## 2020-11-02 NOTE — Progress Notes (Signed)
Subjective:  Patient ID: [name], female    DOB: August 05, 1946  Age: 74 y.o. MRN: [id number]  CC: 2 month follow up   HPI NDIA SAMPATH presents for severe lower back pain with bilaterlal lumbar distribution. There is severe pain and cramping at the left calf intermittently.  Pain was 10/10 last month so that PT had to be aborted. Since then her pain has dropped to 6-7/10. Worse with ambulation, etc.   Depression screen [facility] 2/9 11/02/2020 09/07/2020 04/26/2020  Decreased Interest 0 0 0  Down, Depressed, Hopeless 0 0 0  PHQ - 2 Score 0 0 0  Altered sleeping - - -  Tired, decreased energy - - -  Change in appetite - - -  Feeling bad or failure about yourself  - - -  Trouble concentrating - - -  Moving slowly or fidgety/restless - - -  Suicidal thoughts - - -  PHQ-9 Score - - -  Some recent data might be hidden    History Kaelea has a past medical history of Acute pyelonephritis (10/15/2019), Acute renal failure superimposed on stage 2 chronic kidney disease ([place]) (10/15/2019), Alcoholism ([place]) (01/09/2012), Anemia, Anxiety, Arthritis, B12 deficiency, Carotid artery narrowing (10/03/2012), Chronic kidney disease (CKD), stage II (mild), COPD (chronic obstructive pulmonary disease) ([place]), Depression, Headache(784.0), Hyperlipidemia, Hypertension, Hyponatremia (01/09/2012), Hypothyroid, Menopause, Mini stroke ([place]), Orthostatic syncope (10/02/2012), Pneumonia, SBO (small bowel obstruction) ([place]) (06/28/2016), Shortness of breath, Stroke ([place]) (2010), and Thrombocytopenia ([place]) (10/15/2019).   She has a past surgical history that includes Appendectomy; Tonsillectomy; Cataract extraction w/PHACO (06/20/2012); Cataract extraction w/PHACO (07/11/2012); Colonoscopy (N/A, 12/17/2015); Esophagogastroduodenoscopy (N/A, 12/17/2015); Agile capsule (N/A, 02/22/2016); Knee arthroscopy (Right, 09/2016); and Lumbar laminectomy/decompression microdiscectomy (N/A, 05/25/2017).   Her family history includes GER disease  in her mother; Hypertension in her maternal grandmother; Ulcers in her mother.She reports that she quit smoking about 28 years ago. Her smoking use included cigarettes. She has a 60.00 pack-year smoking history. She has never used smokeless tobacco. She reports that she does not drink alcohol and does not use drugs.    ROS Review of Systems  Constitutional: Negative.   HENT: Negative.   Eyes: Negative for visual disturbance.  Respiratory: Negative for shortness of breath.   Cardiovascular: Negative for chest pain.  Gastrointestinal: Negative for abdominal pain.  Musculoskeletal: Positive for arthralgias and back pain.    Objective:  BP 133/80   Pulse 90   Temp 97.6 F (36.4 C) (Temporal)   Resp 20   Ht 5' 8"  (1.727 m)   Wt 150 lb (68 kg)   LMP 08/21/2013   SpO2 96%   BMI 22.81 kg/m   BP Readings from Last 3 Encounters:  11/02/20 133/80  09/07/20 (!) 146/78  06/30/20 137/75    Wt Readings from Last 3 Encounters:  11/02/20 150 lb (68 kg)  09/07/20 155 lb (70.3 kg)  06/30/20 140 lb (63.5 kg)     Physical Exam Constitutional:      General: She is not in acute distress.    Appearance: She is well-developed.  HENT:     Head: Normocephalic and atraumatic.     Right Ear: External ear normal.     Left Ear: External ear normal.     Nose: Nose normal.  Eyes:     Conjunctiva/sclera: Conjunctivae normal.     Pupils: Pupils are equal, round, and reactive to light.  Neck:     Thyroid: No thyromegaly.  Cardiovascular:     Rate and Rhythm:  Normal rate and regular rhythm.     Heart sounds: Normal heart sounds. No murmur heard.   Pulmonary:     Effort: Pulmonary effort is normal. No respiratory distress.     Breath sounds: Normal breath sounds. No wheezing or rales.  Abdominal:     General: Bowel sounds are normal. There is no distension.     Palpations: Abdomen is soft.     Tenderness: There is no abdominal tenderness.  Musculoskeletal:        General: Normal range of  motion.     Cervical back: Normal range of motion and neck supple.  Lymphadenopathy:     Cervical: No cervical adenopathy.  Skin:    General: Skin is warm and dry.  Neurological:     Mental Status: She is alert and oriented to person, place, and time.     Deep Tendon Reflexes: Reflexes are normal and symmetric.  Psychiatric:        Behavior: Behavior normal.        Thought Content: Thought content normal.        Judgment: Judgment normal.       Assessment & Plan:   Mahati was seen today for 2 month follow up.  Diagnoses and all orders for this visit:  Spinal stenosis, lumbar region with neurogenic claudication -     Ambulatory referral to Neurology  Other specified hypothyroidism -     CBC with Differential/Platelet -     CMP14+EGFR -     TSH + free T4 -     Thyroid Panel With TSH  Essential hypertension -     CBC with Differential/Platelet -     CMP14+EGFR -     TSH + free T4  Mixed hyperlipidemia  Lumbar radiculopathy -     Ambulatory referral to Neurology  Other orders -     calcitonin, salmon, (MIACALCIN/FORTICAL) 200 UNIT/ACT nasal spray; Place 1 spray into alternate nostrils daily.       I have discontinued [name]. Price's nitrofurantoin. I am also having her start on calcitonin (salmon). Additionally, I am having her maintain her aspirin EC, thiamine, Co Z-61, folic acid, nabumetone, liothyronine, DULoxetine, alendronate, rosuvastatin, olmesartan-hydrochlorothiazide, nitrofurantoin (macrocrystal-monohydrate), metoprolol succinate, levothyroxine, and Vitamin D (Ergocalciferol).  Allergies as of 11/02/2020      Reactions   Ciprofloxacin Other (See Comments)   Caused pt to have delusional thoughts and hallucinations   Procardia [nifedipine] Swelling   Edema    Norvasc [amlodipine Besylate] Hives      Medication List       Accurate as of November 02, 2020 10:19 PM. If you have any questions, ask your nurse or doctor.        STOP taking these  medications   nitrofurantoin 100 MG capsule Commonly known as: Macrodantin Stopped by: Claretta Fraise, MD     TAKE these medications   alendronate 70 MG tablet Commonly known as: FOSAMAX Take 1 tablet (70 mg total) by mouth once a week. Take with a full glass of water on an empty stomach.   aspirin EC 81 MG tablet Take 81 mg by mouth daily.   calcitonin (salmon) 200 UNIT/ACT nasal spray Commonly known as: MIACALCIN/FORTICAL Place 1 spray into alternate nostrils daily. Started by: Claretta Fraise, MD   Co Q-10 100 MG Caps Take 100 mg by mouth daily.   DULoxetine 60 MG capsule Commonly known as: Cymbalta Take 1 capsule (60 mg total) by mouth daily.   folic acid 096 MCG  tablet Commonly known as: FOLVITE Take 2,000 mcg by mouth daily.   levothyroxine 125 MCG tablet Commonly known as: SYNTHROID Take 1 tablet (125 mcg total) by mouth daily.   liothyronine 5 MCG tablet Commonly known as: Cytomel Take 2 tablets (10 mcg total) by mouth daily.   metoprolol succinate 100 MG 24 hr tablet Commonly known as: TOPROL-XL Take 1 tablet (100 mg total) by mouth daily. For blood pressure control   nabumetone 500 MG tablet Commonly known as: RELAFEN Take 2 tablets (1,000 mg total) by mouth 2 (two) times daily. For muscle and joint pain   nitrofurantoin (macrocrystal-monohydrate) 100 MG capsule Commonly known as: Macrobid Take 1 capsule (100 mg total) by mouth 2 (two) times daily.   olmesartan-hydrochlorothiazide 40-25 MG tablet Commonly known as: BENICAR HCT TAKE 1 TABLET EVERY DAY   rosuvastatin 10 MG tablet Commonly known as: CRESTOR TAKE 1 TABLET (10 MG TOTAL) BY MOUTH DAILY.   thiamine 100 MG tablet Commonly known as: Vitamin B-1 Take 1 tablet (100 mg total) by mouth daily.   Vitamin D (Ergocalciferol) 1.25 MG (50000 UNIT) Caps capsule Commonly known as: DRISDOL Take 1 capsule (50,000 Units total) by mouth every 7 (seven) days.        Follow-up: Return in about 3  months (around 02/02/2021).  Claretta Fraise, M.D.

## 2020-11-03 LAB — CBC WITH DIFFERENTIAL/PLATELET
Basophils Absolute: 0 10*3/uL (ref 0.0–0.2)
Basos: 1 %
EOS (ABSOLUTE): 0.1 10*3/uL (ref 0.0–0.4)
Eos: 2 %
Hematocrit: 33.2 % — ABNORMAL LOW (ref 34.0–46.6)
Hemoglobin: 11.3 g/dL (ref 11.1–15.9)
Immature Grans (Abs): 0 10*3/uL (ref 0.0–0.1)
Immature Granulocytes: 0 %
Lymphocytes Absolute: 1.2 10*3/uL (ref 0.7–3.1)
Lymphs: 31 %
MCH: 31.5 pg (ref 26.6–33.0)
MCHC: 34 g/dL (ref 31.5–35.7)
MCV: 93 fL (ref 79–97)
Monocytes Absolute: 0.4 10*3/uL (ref 0.1–0.9)
Monocytes: 10 %
Neutrophils Absolute: 2.1 10*3/uL (ref 1.4–7.0)
Neutrophils: 56 %
Platelets: 173 10*3/uL (ref 150–450)
RBC: 3.59 x10E6/uL — ABNORMAL LOW (ref 3.77–5.28)
RDW: 14.1 % (ref 11.7–15.4)
WBC: 3.8 10*3/uL (ref 3.4–10.8)

## 2020-11-03 LAB — CMP14+EGFR
ALT: 7 IU/L (ref 0–32)
AST: 12 IU/L (ref 0–40)
Albumin/Globulin Ratio: 1.9 (ref 1.2–2.2)
Albumin: 4.5 g/dL (ref 3.7–4.7)
Alkaline Phosphatase: 84 IU/L (ref 44–121)
BUN/Creatinine Ratio: 18 (ref 12–28)
BUN: 30 mg/dL — ABNORMAL HIGH (ref 8–27)
Bilirubin Total: 0.3 mg/dL (ref 0.0–1.2)
CO2: 21 mmol/L (ref 20–29)
Calcium: 9.1 mg/dL (ref 8.7–10.3)
Chloride: 100 mmol/L (ref 96–106)
Creatinine, Ser: 1.64 mg/dL — ABNORMAL HIGH (ref 0.57–1.00)
GFR calc Af Amer: 36 mL/min/{1.73_m2} — ABNORMAL LOW (ref >59)
GFR calc non Af Amer: 31 mL/min/{1.73_m2} — ABNORMAL LOW (ref >59)
Globulin, Total: 2.4 g/dL (ref 1.5–4.5)
Glucose: 174 mg/dL — ABNORMAL HIGH (ref 65–99)
Potassium: 4.7 mmol/L (ref 3.5–5.2)
Sodium: 136 mmol/L (ref 134–144)
Total Protein: 6.9 g/dL (ref 6.0–8.5)

## 2020-11-03 LAB — TSH+FREE T4
Free T4: 3.04 ng/dL — ABNORMAL HIGH (ref 0.82–1.77)
TSH: 0.017 u[IU]/mL — ABNORMAL LOW (ref 0.450–4.500)

## 2020-11-07 ENCOUNTER — Other Ambulatory Visit: Payer: Self-pay | Admitting: Family Medicine

## 2020-11-07 DIAGNOSIS — E038 Other specified hypothyroidism: Secondary | ICD-10-CM

## 2020-11-07 MED ORDER — LEVOTHYROXINE SODIUM 100 MCG PO TABS: 100.0000 ug | ORAL_TABLET | Freq: Every day | ORAL | 1 refills | Status: DC

## 2020-11-15 ENCOUNTER — Encounter: Payer: Self-pay | Admitting: Neurology

## 2020-11-15 ENCOUNTER — Ambulatory Visit: Payer: Medicare Other | Admitting: *Deleted

## 2020-11-15 DIAGNOSIS — E782 Mixed hyperlipidemia: Secondary | ICD-10-CM

## 2020-11-15 DIAGNOSIS — I10 Essential (primary) hypertension: Secondary | ICD-10-CM

## 2020-11-15 NOTE — Patient Instructions (Signed)
RN Care manager will call patient back on 11/16/20

## 2020-11-15 NOTE — Chronic Care Management (AMB) (Signed)
  Chronic Care Management   Note  11/15/2020 Name: [name] MRN: [id number] DOB: Jul 17, 1946  I reached out to [name] today to discuss CCM and care coordination. She is having a problem with her insurance but can't locate the letter that she received. That is her primary concern right now and she would like to talk with me about it. She's going to keep looking for the paper and I plan to give her a call back tomorrow morning.   Follow up plan: [place] will reach out again over the next 24 hours  Patient will reach out to [place] as needed  Chong Sicilian, BSN, RN-BC [place] [facility] Direct Dial: [phone]

## 2020-11-16 ENCOUNTER — Ambulatory Visit: Payer: Medicare HMO

## 2020-11-19 ENCOUNTER — Other Ambulatory Visit: Payer: Self-pay

## 2020-11-19 DIAGNOSIS — R202 Paresthesia of skin: Secondary | ICD-10-CM

## 2020-11-22 ENCOUNTER — Ambulatory Visit (INDEPENDENT_AMBULATORY_CARE_PROVIDER_SITE_OTHER): Payer: Medicare HMO | Admitting: Family Medicine

## 2020-11-22 ENCOUNTER — Encounter: Payer: Self-pay | Admitting: Family Medicine

## 2020-11-22 ENCOUNTER — Other Ambulatory Visit: Payer: Self-pay

## 2020-11-22 VITALS — BP 134/72 | HR 67 | Temp 97.4°F | Ht 68.0 in | Wt 152.2 lb

## 2020-11-22 DIAGNOSIS — M1710 Unilateral primary osteoarthritis, unspecified knee: Secondary | ICD-10-CM

## 2020-11-22 DIAGNOSIS — M1712 Unilateral primary osteoarthritis, left knee: Secondary | ICD-10-CM | POA: Diagnosis not present

## 2020-11-22 DIAGNOSIS — M25562 Pain in left knee: Secondary | ICD-10-CM | POA: Diagnosis not present

## 2020-11-22 DIAGNOSIS — E038 Other specified hypothyroidism: Secondary | ICD-10-CM

## 2020-11-22 MED ORDER — BETAMETHASONE SOD PHOS & ACET 6 (3-3) MG/ML IJ SUSP
12.0000 mg | Freq: Once | INTRAMUSCULAR | Status: AC
  Administered 2020-11-22: 12 mg via INTRAMUSCULAR

## 2020-11-22 MED ORDER — LEVOTHYROXINE SODIUM 100 MCG PO TABS: 100.0000 ug | ORAL_TABLET | Freq: Every day | ORAL | 1 refills | Status: DC

## 2020-11-22 MED ORDER — VITAMIN D (ERGOCALCIFEROL) 1.25 MG (50000 UNIT) PO CAPS: 50000.0000 [IU] | ORAL_CAPSULE | ORAL | 3 refills | Status: DC

## 2020-11-22 NOTE — Progress Notes (Signed)
Subjective:  Patient ID: [name], female    DOB: 10/16/1946  Age: 74 y.o. MRN: [id number]  CC: Follow-up (3 week)   HPI [name] presents for follow up for knee injection. She never picked up the thyroid med. Not symptomatic, however. The knees are very much in pain. She requests injection into the left.  Depression screen [facility] 2/9 11/25/2020 11/22/2020 11/02/2020  Decreased Interest 0 0 0  Down, Depressed, Hopeless 0 0 0  PHQ - 2 Score 0 0 0  Altered sleeping - - -  Tired, decreased energy - - -  Change in appetite - - -  Feeling bad or failure about yourself  - - -  Trouble concentrating - - -  Moving slowly or fidgety/restless - - -  Suicidal thoughts - - -  PHQ-9 Score - - -  Some recent data might be hidden    History [name] has a past medical history of Acute pyelonephritis (10/15/2019), Acute renal failure superimposed on stage 2 chronic kidney disease ([place]) (10/15/2019), Alcoholism ([place]) (01/09/2012), Anemia, Anxiety, Arthritis, B12 deficiency, Carotid artery narrowing (10/03/2012), Chronic kidney disease (CKD), stage II (mild), Depression, Headache(784.0), Hyperlipidemia, Hypertension, Hyponatremia (01/09/2012), Hypothyroid, Menopause, Mini stroke ([place]), Orthostatic syncope (10/02/2012), Pneumonia, SBO (small bowel obstruction) ([place]) (06/28/2016), Shortness of breath, Stroke ([place]) (2010), and Thrombocytopenia ([place]) (10/15/2019).   She has a past surgical history that includes Appendectomy; Tonsillectomy; Cataract extraction w/PHACO (06/20/2012); Cataract extraction w/PHACO (07/11/2012); Colonoscopy (N/A, 12/17/2015); Esophagogastroduodenoscopy (N/A, 12/17/2015); Agile capsule (N/A, 02/22/2016); Knee arthroscopy (Right, 09/2016); and Lumbar laminectomy/decompression microdiscectomy (N/A, 05/25/2017).   Her family history includes GER disease in her mother; Hypertension in her maternal grandmother; Ulcers in her mother.She reports that she quit smoking about 28 years ago. Her  smoking use included cigarettes. She has a 60.00 pack-year smoking history. She has never used smokeless tobacco. She reports that she does not drink alcohol and does not use drugs.    ROS Review of Systems  Constitutional: Negative.   HENT: Negative.   Eyes: Negative for visual disturbance.  Respiratory: Negative for shortness of breath.   Cardiovascular: Negative for chest pain.  Gastrointestinal: Negative for abdominal pain.  Musculoskeletal: Positive for arthralgias (knees).    Objective:  BP 134/72   Pulse 67   Temp (!) 97.4 F (36.3 C) (Temporal)   Ht 5\' 8"  (1.727 m)   Wt 152 lb 3.2 oz (69 kg)   LMP 08/21/2013   BMI 23.14 kg/m   BP Readings from Last 3 Encounters:  11/22/20 134/72  11/02/20 133/80  09/07/20 (!) 146/78    Wt Readings from Last 3 Encounters:  11/22/20 152 lb 3.2 oz (69 kg)  11/02/20 150 lb (68 kg)  09/07/20 155 lb (70.3 kg)     Physical Exam Constitutional:      General: She is not in acute distress.    Appearance: She is well-developed and well-nourished.  Cardiovascular:     Rate and Rhythm: Normal rate and regular rhythm.  Pulmonary:     Breath sounds: Normal breath sounds.  Skin:    General: Skin is warm and dry.  Neurological:     Mental Status: She is alert and oriented to person, place, and time.  Psychiatric:        Mood and Affect: Mood and affect normal.       Assessment & Plan:   After obtaining informed consent, A steroid injection was performed at the left knee. The lateral compartment was cleansed with betadine. With  1.5 inch, 23 gauge needle using 2.5 ml marcan and 9 mg of Celestone the joint space was entered and injected. This was well tolerated. Simple dressing applied.    I am having [name]. Price maintain her aspirin EC, thiamine, Co B-09, folic acid, nabumetone, liothyronine, DULoxetine, alendronate, rosuvastatin, olmesartan-hydrochlorothiazide, metoprolol succinate, calcitonin (salmon), levothyroxine, and  Vitamin D (Ergocalciferol). We administered betamethasone acetate-betamethasone sodium phosphate.  Allergies as of 11/22/2020      Reactions   Ciprofloxacin Other (See Comments)   Caused pt to have delusional thoughts and hallucinations   Procardia [nifedipine] Swelling   Edema    Norvasc [amlodipine Besylate] Hives      Medication List       Accurate as of November 22, 2020 11:59 PM. If you have any questions, ask your nurse or doctor.        alendronate 70 MG tablet Commonly known as: FOSAMAX Take 1 tablet (70 mg total) by mouth once a week. Take with a full glass of water on an empty stomach.   aspirin EC 81 MG tablet Take 81 mg by mouth daily.   calcitonin (salmon) 200 UNIT/ACT nasal spray Commonly known as: MIACALCIN/FORTICAL Place 1 spray into alternate nostrils daily.   Co Q-10 100 MG Caps Take 100 mg by mouth daily.   DULoxetine 60 MG capsule Commonly known as: Cymbalta Take 1 capsule (60 mg total) by mouth daily.   folic acid 628 MCG tablet Commonly known as: FOLVITE Take 2,000 mcg by mouth daily.   levothyroxine 100 MCG tablet Commonly known as: SYNTHROID Take 1 tablet (100 mcg total) by mouth daily.   liothyronine 5 MCG tablet Commonly known as: Cytomel Take 2 tablets (10 mcg total) by mouth daily.   metoprolol succinate 100 MG 24 hr tablet Commonly known as: TOPROL-XL Take 1 tablet (100 mg total) by mouth daily. For blood pressure control   nabumetone 500 MG tablet Commonly known as: RELAFEN Take 2 tablets (1,000 mg total) by mouth 2 (two) times daily. For muscle and joint pain   nitrofurantoin (macrocrystal-monohydrate) 100 MG capsule Commonly known as: Macrobid Take 1 capsule (100 mg total) by mouth 2 (two) times daily.   olmesartan-hydrochlorothiazide 40-25 MG tablet Commonly known as: BENICAR HCT TAKE 1 TABLET EVERY DAY   rosuvastatin 10 MG tablet Commonly known as: CRESTOR TAKE 1 TABLET (10 MG TOTAL) BY MOUTH DAILY.   thiamine 100  MG tablet Commonly known as: Vitamin B-1 Take 1 tablet (100 mg total) by mouth daily.   Vitamin D (Ergocalciferol) 1.25 MG (50000 UNIT) Caps capsule Commonly known as: DRISDOL Take 1 capsule (50,000 Units total) by mouth every 7 (seven) days.        Follow-up: No follow-ups on file.  Claretta Fraise, M.D.

## 2020-11-24 ENCOUNTER — Other Ambulatory Visit: Payer: Self-pay

## 2020-11-24 ENCOUNTER — Ambulatory Visit (INDEPENDENT_AMBULATORY_CARE_PROVIDER_SITE_OTHER): Payer: Medicare HMO

## 2020-11-24 DIAGNOSIS — Z23 Encounter for immunization: Secondary | ICD-10-CM | POA: Diagnosis not present

## 2020-11-24 NOTE — Progress Notes (Signed)
   Covid-19 Vaccination Clinic  Name:  MAKAYLA LANTER    MRN: [id number] DOB: 04-Dec-1946  11/24/2020  Ms. Price was observed post Covid-19 immunization for 15 minutes without incident. She was provided with Vaccine Information Sheet and instruction to access the V-Safe system.   [name] was instructed to call 911 with any severe reactions post vaccine: Marland Kitchen Difficulty breathing  . Swelling of face and throat  . A fast heartbeat  . A bad rash all over body  . Dizziness and weakness   Immunizations Administered    No immunizations on file.

## 2020-11-25 ENCOUNTER — Ambulatory Visit (INDEPENDENT_AMBULATORY_CARE_PROVIDER_SITE_OTHER): Payer: Medicare HMO | Admitting: *Deleted

## 2020-11-25 DIAGNOSIS — Z0000 Encounter for general adult medical examination without abnormal findings: Secondary | ICD-10-CM

## 2020-11-25 NOTE — Progress Notes (Addendum)
MEDICARE ANNUAL WELLNESS VISIT  11/25/2020  Telephone Visit Disclaimer This Medicare AWV was conducted by telephone due to national recommendations for restrictions regarding the COVID-19 Pandemic (e.g. social distancing).  I verified, using two identifiers, that I am speaking with [name] or their authorized healthcare agent. I discussed the limitations, risks, security, and privacy concerns of performing an evaluation and management service by telephone and the potential availability of an in-person appointment in the future. The patient expressed understanding and agreed to proceed.  Location of Patient:Home  Location of Provider (nurse):  Office   Subjective:    [name] is a 74 y.o. female patient of Stacks, Cletus Gash, MD who had a Medicare Annual Wellness Visit today via telephone. [name] is Retired and lives with their son. she has 2 children. she reports that she is socially active and does interact with friends/family regularly. she is minimally physically active and enjoys reading.  Patient Care Team: Kelsey Fraise, MD as PCP - General (Family Medicine) [name], Kelsey Estimable, MD as Consulting Physician (Gastroenterology) Kelsey Maudlin, MD as Consulting Physician (Orthopedic Surgery) Kelsey China, RN as Case Manager  Advanced Directives 11/25/2020 09/22/2020 06/30/2020 10/12/2019 10/12/2019 08/01/2019 04/21/2019  Does Patient Have a Medical Advance Directive? No Yes No Yes Yes Yes Yes  Type of Advance Directive - - - Living will;Healthcare Power of [place];Living will Living will;Healthcare Power of [place]  Does patient want to make changes to medical advance directive? - - - No - Patient declined - No - Patient declined No - Patient declined  Copy of [place] in Chart? - - - No - copy requested - No - copy requested No - copy requested  Would patient like information on creating a  medical advance directive? Yes (MAU/Ambulatory/Procedural Areas - Information given) - - - - - -  Pre-existing out of facility DNR order (yellow form or pink MOST form) - - - - - - -    Hospital Utilization Over the Past 12 Months: # of hospitalizations or ER visits: 0 # of surgeries: 0  Review of Systems    Patient reports that her overall health is unchanged compared to last year.  History obtained from chart review and the patient General ROS: negative  Patient Reported Readings (BP, Pulse, CBG, Weight, etc) none  Pain Assessment Pain : No/denies pain     Current Medications & Allergies (verified) Allergies as of 11/25/2020      Reactions   Ciprofloxacin Other (See Comments)   Caused pt to have delusional thoughts and hallucinations   Procardia [nifedipine] Swelling   Edema    Norvasc [amlodipine Besylate] Hives      Medication List       Accurate as of November 25, 2020 12:16 PM. If you have any questions, ask your nurse or doctor.        STOP taking these medications   nitrofurantoin (macrocrystal-monohydrate) 100 MG capsule Commonly known as: Macrobid     TAKE these medications   alendronate 70 MG tablet Commonly known as: FOSAMAX Take 1 tablet (70 mg total) by mouth once a week. Take with a full glass of water on an empty stomach.   aspirin EC 81 MG tablet Take 81 mg by mouth daily.   calcitonin (salmon) 200 UNIT/ACT nasal spray Commonly known as: MIACALCIN/FORTICAL Place 1 spray into alternate nostrils daily.   Co Q-10 100 MG Caps Take 100 mg by  mouth daily.   DULoxetine 60 MG capsule Commonly known as: Cymbalta Take 1 capsule (60 mg total) by mouth daily.   folic acid 419 MCG tablet Commonly known as: FOLVITE Take 2,000 mcg by mouth daily.   levothyroxine 100 MCG tablet Commonly known as: SYNTHROID Take 1 tablet (100 mcg total) by mouth daily.   liothyronine 5 MCG tablet Commonly known as: Cytomel Take 2 tablets (10 mcg total) by mouth  daily.   metoprolol succinate 100 MG 24 hr tablet Commonly known as: TOPROL-XL Take 1 tablet (100 mg total) by mouth daily. For blood pressure control   nabumetone 500 MG tablet Commonly known as: RELAFEN Take 2 tablets (1,000 mg total) by mouth 2 (two) times daily. For muscle and joint pain   olmesartan-hydrochlorothiazide 40-25 MG tablet Commonly known as: BENICAR HCT TAKE 1 TABLET EVERY DAY   rosuvastatin 10 MG tablet Commonly known as: CRESTOR TAKE 1 TABLET (10 MG TOTAL) BY MOUTH DAILY.   thiamine 100 MG tablet Commonly known as: Vitamin B-1 Take 1 tablet (100 mg total) by mouth daily.   Vitamin D (Ergocalciferol) 1.25 MG (50000 UNIT) Caps capsule Commonly known as: DRISDOL Take 1 capsule (50,000 Units total) by mouth every 7 (seven) days.       History (reviewed): Past Medical History:  Diagnosis Date  . Acute pyelonephritis 10/15/2019  . Acute renal failure superimposed on stage 2 chronic kidney disease ([place]) 10/15/2019  . Alcoholism ([place]) 01/09/2012  . Anemia   . Anxiety   . Arthritis    Left knee  . B12 deficiency   . Carotid artery narrowing 10/03/2012   On the right.  . Chronic kidney disease (CKD), stage II (mild)   . Depression   . Headache(784.0)   . Hyperlipidemia   . Hypertension   . Hyponatremia 01/09/2012  . Hypothyroid   . Menopause   . Mini stroke ([place])   . Orthostatic syncope 10/02/2012  . Pneumonia   . SBO (small bowel obstruction) ([place]) 06/28/2016  . Shortness of breath   . Stroke [facility]) 2010   no deficits  . Thrombocytopenia ([place]) 10/15/2019   Past Surgical History:  Procedure Laterality Date  . AGILE CAPSULE N/A 02/22/2016   dummy capsule remained in the distal ileum  . APPENDECTOMY    . CATARACT EXTRACTION W/PHACO  06/20/2012   Procedure: CATARACT EXTRACTION PHACO AND INTRAOCULAR LENS PLACEMENT (IOC);  Surgeon: Kelsey Branch, MD;  Location: [facility];  Service: Ophthalmology;  Laterality: Right;  CDE:10.66  . CATARACT EXTRACTION W/PHACO   07/11/2012   Procedure: CATARACT EXTRACTION PHACO AND INTRAOCULAR LENS PLACEMENT (IOC);  Surgeon: Kelsey Branch, MD;  Location: [facility];  Service: Ophthalmology;  Laterality: Left;  CDE: 12.69  . COLONOSCOPY N/A 12/17/2015   RMR: Marked circumferential ulceration of the ascending colon/Ileocecal valve ulceration with biopsy most consistent with NSAID injury versus inflammatory bowel disease, pancolonoc diverticulosis redundant colon  . ESOPHAGOGASTRODUODENOSCOPY N/A 12/17/2015   [id number]:[id number] gastric polyp, biopsy benign  . KNEE ARTHROSCOPY Right 09/2016  . LUMBAR LAMINECTOMY/DECOMPRESSION MICRODISCECTOMY N/A 05/25/2017   Procedure: CENTRAL DECOMPRESSIVE LUMBAR LAMINECTOMY FOR SPINAL STENOSIS L3-L4, FORAMINOTOMY FOR THE L4 ROOT AND L5 ROOT;  Surgeon: Kelsey Maudlin, MD;  Location: [facility];  Service: Orthopedics;  Laterality: N/A;  . TONSILLECTOMY     Family History  Problem Relation Age of Onset  . GER disease Mother   . Ulcers Mother   . Hypertension Maternal Grandmother    Social History   Socioeconomic History  . Marital status: Widowed  Spouse name: Not on file  . Number of children: 2  . Years of education: 44  . Highest education level: Some college, no degree  Occupational History  . 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 Smoking status: Former Smoker    Packs/day: 2.00    Years: 30.00    Pack years: 60.00    Types: Cigarettes    Quit date: 06/17/1992    Years since quitting: 28.4  . Smokeless tobacco: Never Used  Vaping Use  . Vaping Use: Never used  Substance and Sexual Activity  . Alcohol use: No  . Drug use: No  . Sexual activity: Not Currently  Other Topics Concern  . Not on file  Social History Narrative  . Not on file   Social Determinants of Health   Financial Resource Strain: Low Risk   . Difficulty of Paying Living Expenses: Not hard at all  Food Insecurity: No Food Insecurity  . Worried About  Charity fundraiser in the Last Year: Never true  . Ran Out of Food in the Last Year: Never true  Transportation Needs: No Transportation Needs  . Lack of Transportation (Medical): No  . Lack of Transportation (Non-Medical): No  Physical Activity: Inactive  . Days of Exercise per Week: 0 days  . Minutes of Exercise per Session: 0 min  Stress: No Stress Concern Present  . Feeling of Stress : Not at all  Social Connections: Moderately Integrated  . Frequency of Communication with Friends and Family: More than three times a week  . Frequency of Social Gatherings with Friends and Family: More than three times a week  . Attends Religious Services: More than 4 times per year  . Active Member of Clubs or Organizations: Yes  . Attends Archivist Meetings: More than 4 times per year  . Marital Status: Widowed    Activities of Daily Living In your present state of health, do you have any difficulty performing the following activities: 11/25/2020  Hearing? N  Vision? N  Difficulty concentrating or making decisions? N  Walking or climbing stairs? N  Dressing or bathing? N  Doing errands, shopping? N  Preparing Food and eating ? N  Using the Toilet? N  In the past six months, have you accidently leaked urine? N  Do you have problems with loss of bowel control? N  Managing your Medications? N  Managing your Finances? N  Housekeeping or managing your Housekeeping? N  Some recent data might be hidden    Patient Education/ Literacy How often do you need to have someone help you when you read instructions, pamphlets, or other written materials from your doctor or pharmacy?: 1 - Never What is the last grade level you completed in school?: 12th Grade  Exercise Current Exercise Habits: The patient does not participate in regular exercise at present, Exercise limited by: orthopedic condition(s)  Diet Patient reports consuming 3 meals a day and 2 snack(s) a day Patient reports that  her primary diet is: Regular Patient reports that she does have regular access to food.   Depression Screen PHQ 2/9 Scores 11/25/2020 11/22/2020 11/02/2020 09/07/2020 04/26/2020 03/03/2020 01/19/2020  PHQ - 2 Score 0 0 0 0 0 0 1  PHQ- 9 Score - - - - - - -     Fall Risk Fall Risk  11/25/2020 11/22/2020 11/02/2020 09/07/2020 04/26/2020  Falls in the past year? 1 1  1 1 1   Number falls in past yr: 1 1 1 1 1   Comment - - - - At least 6 in the past year  Injury with Fall? 0 0 0 0 0  Comment - - - - Bruising and scrapes  Risk Factor Category  - - - - -  Risk for fall due to : History of fall(s);Impaired balance/gait;Impaired mobility History of fall(s);Impaired balance/gait;Impaired mobility History of fall(s);Impaired balance/gait;Impaired mobility History of fall(s);Impaired balance/gait;Impaired mobility Impaired balance/gait;Impaired mobility  Risk for fall due to: Comment - - - - -  Follow up Falls evaluation completed Falls evaluation completed Falls evaluation completed Falls evaluation completed Falls evaluation completed     Objective:  [name] seemed alert and oriented and she participated appropriately during our telephone visit.  Blood Pressure Weight BMI  BP Readings from Last 3 Encounters:  11/22/20 134/72  11/02/20 133/80  09/07/20 (!) 146/78   Wt Readings from Last 3 Encounters:  11/22/20 152 lb 3.2 oz (69 kg)  11/02/20 150 lb (68 kg)  09/07/20 155 lb (70.3 kg)   BMI Readings from Last 1 Encounters:  11/22/20 23.14 kg/m    *Unable to obtain current vital signs, weight, and BMI due to telephone visit type  Hearing/Vision  . [name] did not seem to have difficulty with hearing/understanding during the telephone conversation . Reports that she has had a formal eye exam by an eye care professional within the past year . Reports that she has not had a formal hearing evaluation within the past year *Unable to fully assess hearing and vision during telephone visit  type  Cognitive Function: 6CIT Screen 11/25/2020 08/01/2019  What Year? 0 points 0 points  What month? 0 points 0 points  What time? 0 points 0 points  Count back from 20 0 points 0 points  Months in reverse 0 points 0 points  Repeat phrase 0 points 2 points  Total Score 0 2   (Normal:0-7, Significant for Dysfunction: >8)  Normal Cognitive Function Screening: Yes   Immunization & Health Maintenance Record Immunization History  Administered Date(s) Administered  . Fluad Quad(high Dose 65+) 09/29/2019, 09/07/2020  . Influenza, High Dose Seasonal PF 09/23/2014, 09/11/2016, 09/05/2017, 09/16/2018  . Influenza,inj,Quad PF,6+ Mos 10/01/2013, 09/10/2015  . Moderna SARS-COV2 Booster Vaccination 11/24/2020  . Moderna Sars-Covid-2 Vaccination 02/19/2020, 03/24/2020  . Pneumococcal Conjugate-13 05/12/2016  . Pneumococcal Polysaccharide-23 01/10/2012, 04/27/2015  . Tdap 12/14/2015    Health Maintenance  Topic Date Due  . MAMMOGRAM  11/05/2020  . TETANUS/TDAP  12/13/2025  . COLONOSCOPY  12/16/2025  . INFLUENZA VACCINE  Completed  . DEXA SCAN  Completed  . COVID-19 Vaccine  Completed  . Hepatitis C Screening  Completed  . PNA vac Low Risk Adult  Completed       Assessment  This is a routine wellness examination for [name].  Health Maintenance: Due or Overdue Health Maintenance Due  Topic Date Due  . MAMMOGRAM  11/05/2020    [name] does not need a referral for Community Assistance: Care Management:   no Social Work:    no Prescription Assistance:  no Nutrition/Diabetes Education:  no   Plan:  Personalized Goals Goals Addressed            This Visit's Progress   . Exercise 3x per week (30 min per time)       Chair exercises for 15 minutes daily. Refer to handout that was given.    11/25/2020 AWV Goal:  Exercise for General Health   Patient will verbalize understanding of the benefits of increased physical activity:  Exercising regularly is  important. It will improve your overall fitness, flexibility, and endurance.  Regular exercise also will improve your overall health. It can help you control your weight, reduce stress, and improve your bone density.  Over the next year, patient will increase physical activity as tolerated with a goal of at least 150 minutes of moderate physical activity per week.   You can tell that you are exercising at a moderate intensity if your heart starts beating faster and you start breathing faster but can still hold a conversation.  Moderate-intensity exercise ideas include:  Walking 1 mile (1.6 km) in about 15 minutes  Biking  Hiking  Golfing  Dancing  Water aerobics  Patient will verbalize understanding of everyday activities that increase physical activity by providing examples like the following: ? Yard work, such as: ? Pushing a Conservation officer, nature ? Raking and bagging leaves ? Washing your car ? Pushing a stroller ? Shoveling snow ? Gardening ? Washing windows or floors  Patient will be able to explain general safety guidelines for exercising:   Before you start a new exercise program, talk with your health care provider.  Do not exercise so much that you hurt yourself, feel dizzy, or get very short of breath.  Wear comfortable clothes and wear shoes with good support.  Drink plenty of water while you exercise to prevent dehydration or heat stroke.  Work out until your breathing and your heartbeat get faster.       Personalized Health Maintenance & Screening Recommendations  Screening mammography Advanced directives: has an advanced directive - a copy HAS NOT been provided.  Lung Cancer Screening Recommended: no (Low Dose CT Chest recommended if Age 33-80 years, 30 pack-year currently smoking OR have quit w/in past 15 years) Hepatitis C Screening recommended: no HIV Screening recommended: no  Advanced Directives: Written information was not prepared per patient's  request.  Referrals & Orders No orders of the defined types were placed in this encounter.   Follow-up Plan . Follow-up with Kelsey Fraise, MD as planned    I have personally reviewed and noted the following in the patient's chart:   . Medical and social history . Use of alcohol, tobacco or illicit drugs  . Current medications and supplements . Functional ability and status . Nutritional status . Physical activity . Advanced directives . List of other physicians . Hospitalizations, surgeries, and ER visits in previous 12 months . Vitals . Screenings to include cognitive, depression, and falls . Referrals and appointments  In addition, I have reviewed and discussed with [name] certain preventive protocols, quality metrics, and best practice recommendations. A written personalized care plan for preventive services as well as general preventive health recommendations is available and can be mailed to the patient at her request.      Wardell Heath, LPN  96/22/2979  AVS printed and mailed to patient  I have reviewed and agree with the above AWV documentation.  [name], M.D.

## 2020-11-25 NOTE — Patient Instructions (Signed)
  [place] Maintenance Summary and Written Plan of Care  [name] ,  Thank you for allowing me to perform your Medicare Annual Wellness Visit and for your ongoing commitment to your health.   Health Maintenance & Immunization History Health Maintenance  Topic Date Due  . MAMMOGRAM  11/05/2020  . TETANUS/TDAP  12/13/2025  . COLONOSCOPY  12/16/2025  . INFLUENZA VACCINE  Completed  . DEXA SCAN  Completed  . COVID-19 Vaccine  Completed  . Hepatitis C Screening  Completed  . PNA vac Low Risk Adult  Completed   Immunization History  Administered Date(s) Administered  . Fluad Quad(high Dose 65+) 09/29/2019, 09/07/2020  . Influenza, High Dose Seasonal PF 09/23/2014, 09/11/2016, 09/05/2017, 09/16/2018  . Influenza,inj,Quad PF,6+ Mos 10/01/2013, 09/10/2015  . Moderna SARS-COV2 Booster Vaccination 11/24/2020  . Moderna Sars-Covid-2 Vaccination 02/19/2020, 03/24/2020  . Pneumococcal Conjugate-13 05/12/2016  . Pneumococcal Polysaccharide-23 01/10/2012, 04/27/2015  . Tdap 12/14/2015    These are the patient goals that we discussed: Goals Addressed            This Visit's Progress   . Exercise 3x per week (30 min per time)       Chair exercises for 15 minutes daily. Refer to handout that was given.    11/25/2020 AWV Goal: Exercise for General Health   Patient will verbalize understanding of the benefits of increased physical activity:  Exercising regularly is important. It will improve your overall fitness, flexibility, and endurance.  Regular exercise also will improve your overall health. It can help you control your weight, reduce stress, and improve your bone density.  Over the next year, patient will increase physical activity as tolerated with a goal of at least 150 minutes of moderate physical activity per week.   You can tell that you are exercising at a moderate intensity if your heart starts beating faster and you start breathing faster  but can still hold a conversation.  Moderate-intensity exercise ideas include:  Walking 1 mile (1.6 km) in about 15 minutes  Biking  Hiking  Golfing  Dancing  Water aerobics  Patient will verbalize understanding of everyday activities that increase physical activity by providing examples like the following: ? Yard work, such as: ? Pushing a Conservation officer, nature ? Raking and bagging leaves ? Washing your car ? Pushing a stroller ? Shoveling snow ? Gardening ? Washing windows or floors  Patient will be able to explain general safety guidelines for exercising:   Before you start a new exercise program, talk with your health care provider.  Do not exercise so much that you hurt yourself, feel dizzy, or get very short of breath.  Wear comfortable clothes and wear shoes with good support.  Drink plenty of water while you exercise to prevent dehydration or heat stroke.  Work out until your breathing and your heartbeat get faster.         This is a list of Health Maintenance Items that are overdue or due now: Health Maintenance Due  Topic Date Due  . MAMMOGRAM  11/05/2020     Orders/Referrals Placed Today: No orders of the defined types were placed in this encounter.  (Contact our referral department at [phone] if you have not spoken with someone about your referral appointment within the next 5 days)    Follow-up Plan Follow up with Dr. Livia Snellen as scheduled

## 2020-11-29 ENCOUNTER — Encounter: Payer: Self-pay | Admitting: Family Medicine

## 2020-12-07 ENCOUNTER — Ambulatory Visit: Payer: Medicare HMO | Admitting: Family Medicine

## 2020-12-14 ENCOUNTER — Telehealth: Payer: Self-pay

## 2020-12-14 NOTE — Telephone Encounter (Signed)
She just needs to call the insurance or the urologist and see if they are in her network. Most do not require a referral ,but if insurance tells her it is necessary, let me know who she is seeing and when and I will order it.

## 2020-12-14 NOTE — Telephone Encounter (Signed)
Patient aware and verbalizes understanding. 

## 2020-12-20 ENCOUNTER — Ambulatory Visit: Payer: Medicare HMO | Admitting: Family Medicine

## 2020-12-20 ENCOUNTER — Telehealth: Payer: Self-pay | Admitting: Family Medicine

## 2020-12-20 ENCOUNTER — Encounter: Payer: Self-pay | Admitting: Family Medicine

## 2020-12-22 ENCOUNTER — Encounter: Payer: Self-pay | Admitting: Neurology

## 2020-12-22 ENCOUNTER — Other Ambulatory Visit: Payer: Self-pay

## 2020-12-22 ENCOUNTER — Ambulatory Visit: Payer: Medicare HMO | Admitting: Neurology

## 2020-12-22 VITALS — BP 182/95 | HR 62 | Ht 68.0 in | Wt 148.0 lb

## 2020-12-22 DIAGNOSIS — R202 Paresthesia of skin: Secondary | ICD-10-CM

## 2020-12-22 DIAGNOSIS — G621 Alcoholic polyneuropathy: Secondary | ICD-10-CM | POA: Diagnosis not present

## 2020-12-22 DIAGNOSIS — M5416 Radiculopathy, lumbar region: Secondary | ICD-10-CM | POA: Diagnosis not present

## 2020-12-22 NOTE — Progress Notes (Signed)
[facility] Clinic Note - Initial Visit   Date: 12/22/20  [name] MRN: [id number] DOB: 1946-04-21   Dear Dr. Livia Snellen:  Thank you for your kind referral of [name] for consultation of left leg pain. Although her history is well known to you, please allow [place] to reiterate it for the purpose of our medical record. The patient was accompanied to the clinic by self.    History of Present Illness: [name] is a 75 y.o. left-handed female with CKD stage 2, anxiety, vitamin B12 deficiency, carotid disease, hyperlipidemia, hypertension, hypothyroidism, TIA, alcoholism, and chronic low back pain presenting for evaluation of left leg pain.   In 2018, she had lumbar decompression for severe spinal stenosis at L3-4, left L4 and L5 foraminal stenosis.  She also had left knee surgery.  Prior to her surgery, she was having a lot of low back pain and pain into the left leg.  Following surgery, her left foot weakness improved, but she continues to have achy low back pain and achy left leg/knee pain.  She was seen by Dr. Marcello Moores at [facility] in May 2021 for these complaints.  At that visit, MRI lumbar spine and plain films of the lumbar spine was ordered to help guide management.  MRI lumbar spine from 05/2020 showed acute L3 compression fracture, chronic compression fracture at T12, L1, and L2.  No canal or foraminal stenosis.  She underwent kyphoplasty, but did not appreciate any change in her pain.  She had not seen pain management or had ESI.  She was seen at our office in 2015 by Dr. Tomi Likens for spells of expressive aphasia.   She denies any numbness/tingling of the feet. She endorses imbalance and has been using a walker for the past 6 months.  She previously was drinking 4-5 drinks nightly x several years, but tells me she no longer does this.  Her history shows vitamin B12 deficiency for which she takes supplement.  She lives with her son.      Out-side paper records, electronic medical record, and images have been reviewed where available and summarized as:  MRI lumbar spine 05/13/2020: Acute compression fracture at L3, superimposed upon chronic loss of height of about 20%. Marrow edema indicates acute injury. There does not appear to be additional loss of height however, when compared to the CT scan last November.  Old compression deformities at T12, L1, L2 and minimal endplate deformities at L4 and L5. None of those show any acute or subacute component. There is some chronic cystic change at the T12 level, which simulates a recent injury, but I do not believe there is a change when compared to last November.   Lab Results  Component Value Date   HGBA1C 4.7 11/13/2013   Lab Results  Component Value Date   VITAMINB12 206 (L) 02/19/2018   Lab Results  Component Value Date   TSH 0.017 (L) 11/02/2020   Lab Results  Component Value Date   ESRSEDRATE 27 (H) 07/02/2016    Past Medical History:  Diagnosis Date  . Acute pyelonephritis 10/15/2019  . Acute renal failure superimposed on stage 2 chronic kidney disease ([place]) 10/15/2019  . Alcoholism ([place]) 01/09/2012  . Anemia   . Anxiety   . Arthritis    Left knee  . B12 deficiency   . Carotid artery narrowing 10/03/2012   On the right.  . Chronic kidney disease (CKD), stage II (mild)   . Depression   .  Headache(784.0)   . Hyperlipidemia   . Hypertension   . Hyponatremia 01/09/2012  . Hypothyroid   . Menopause   . Mini stroke ([place])   . Orthostatic syncope 10/02/2012  . Pneumonia   . SBO (small bowel obstruction) ([place]) 06/28/2016  . Shortness of breath   . Stroke [facility]) 2010   no deficits  . Thrombocytopenia ([place]) 10/15/2019    Past Surgical History:  Procedure Laterality Date  . AGILE CAPSULE N/A 02/22/2016   dummy capsule remained in the distal ileum  . APPENDECTOMY    . CATARACT EXTRACTION W/PHACO  06/20/2012   Procedure: CATARACT EXTRACTION PHACO AND  INTRAOCULAR LENS PLACEMENT (IOC);  Surgeon: Tonny Branch, MD;  Location: [facility];  Service: Ophthalmology;  Laterality: Right;  CDE:10.66  . CATARACT EXTRACTION W/PHACO  07/11/2012   Procedure: CATARACT EXTRACTION PHACO AND INTRAOCULAR LENS PLACEMENT (IOC);  Surgeon: Tonny Branch, MD;  Location: [facility];  Service: Ophthalmology;  Laterality: Left;  CDE: 12.69  . COLONOSCOPY N/A 12/17/2015   RMR: Marked circumferential ulceration of the ascending colon/Ileocecal valve ulceration with biopsy most consistent with NSAID injury versus inflammatory bowel disease, pancolonoc diverticulosis redundant colon  . ESOPHAGOGASTRODUODENOSCOPY N/A 12/17/2015   NKN:[id number] gastric polyp, biopsy benign  . KNEE ARTHROSCOPY Right 09/2016  . LUMBAR LAMINECTOMY/DECOMPRESSION MICRODISCECTOMY N/A 05/25/2017   Procedure: CENTRAL DECOMPRESSIVE LUMBAR LAMINECTOMY FOR SPINAL STENOSIS L3-L4, FORAMINOTOMY FOR THE L4 ROOT AND L5 ROOT;  Surgeon: Latanya Maudlin, MD;  Location: [facility];  Service: Orthopedics;  Laterality: N/A;  . TONSILLECTOMY       Medications:  Outpatient Encounter Medications as of 12/22/2020  Medication Sig  . alendronate (FOSAMAX) 70 MG tablet Take 1 tablet (70 mg total) by mouth once a week. Take with a full glass of water on an empty stomach.  Marland Kitchen aspirin EC 81 MG tablet Take 81 mg by mouth daily.  . calcitonin, salmon, (MIACALCIN/FORTICAL) 200 UNIT/ACT nasal spray Place 1 spray into alternate nostrils daily.  . Coenzyme Q10 (CO Q-10) 100 MG CAPS Take 100 mg by mouth daily.  . DULoxetine (CYMBALTA) 60 MG capsule Take 1 capsule (60 mg total) by mouth daily.  . folic acid (FOLVITE) 379 MCG tablet Take 2,000 mcg by mouth daily.  Marland Kitchen levothyroxine (SYNTHROID) 125 MCG tablet Take 125 mcg by mouth daily.  Marland Kitchen liothyronine (CYTOMEL) 5 MCG tablet Take 2 tablets (10 mcg total) by mouth daily.  . metoprolol succinate (TOPROL-XL) 100 MG 24 hr tablet Take 1 tablet (100 mg total) by mouth daily. For blood pressure control  .  nabumetone (RELAFEN) 500 MG tablet Take 2 tablets (1,000 mg total) by mouth 2 (two) times daily. For muscle and joint pain  . olmesartan-hydrochlorothiazide (BENICAR HCT) 40-25 MG tablet TAKE 1 TABLET EVERY DAY  . rosuvastatin (CRESTOR) 10 MG tablet TAKE 1 TABLET (10 MG TOTAL) BY MOUTH DAILY.  Marland Kitchen thiamine (VITAMIN B-1) 100 MG tablet Take 1 tablet (100 mg total) by mouth daily.  . Vitamin D, Ergocalciferol, (DRISDOL) 1.25 MG (50000 UNIT) CAPS capsule Take 1 capsule (50,000 Units total) by mouth every 7 (seven) days.  . [DISCONTINUED] levothyroxine (SYNTHROID) 100 MCG tablet Take 1 tablet (100 mcg total) by mouth daily.   No facility-administered encounter medications on file as of 12/22/2020.    Allergies:  Allergies  Allergen Reactions  . Ciprofloxacin Other (See Comments)    Caused pt to have delusional thoughts and hallucinations  . Procardia [Nifedipine] Swelling    Edema    . Norvasc [Amlodipine Besylate] Hives  Family History: Family History  Problem Relation Age of Onset  . GER disease Mother   . Ulcers Mother   . Hypertension Maternal Grandmother     Social History: Social History   Tobacco Use  . Smoking status: Former Smoker    Packs/day: 2.00    Years: 30.00    Pack years: 60.00    Types: Cigarettes    Quit date: 06/17/1992    Years since quitting: 28.5  . Smokeless tobacco: Never Used  Vaping Use  . Vaping Use: Never used  Substance Use Topics  . Alcohol use: No  . Drug use: No   Social History   Social History Narrative  . Not on file    Vital Signs:  BP (!) 182/95   Pulse 62   Ht 5\' 8"  (1.727 m)   Wt 148 lb (67.1 kg)   LMP 08/21/2013   SpO2 97%   BMI 22.50 kg/m   Neurological Exam: MENTAL STATUS including orientation to time, place, person, recent and remote memory, attention span and concentration, language, and fund of knowledge is normal.  Speech is not dysarthric.  CRANIAL NERVES: II:  No visual field defects.   III-IV-VI: Pupils  equal round and reactive to light.  Normal conjugate, extra-ocular eye movements in all directions of gaze.  No nystagmus.  No ptosis.   V:  Normal facial sensation.    VII:  Normal facial symmetry and movements.   VIII:  Normal hearing and vestibular function.   IX-X:  Normal palatal movement.   XI:  Normal shoulder shrug and head rotation.   XII:  Normal tongue strength and range of motion, no deviation or fasciculation.  MOTOR:  No atrophy, fasciculations or abnormal movements.  No pronator drift.   Upper Extremity:  Right  Left  Deltoid  5/5   5/5   Biceps  5/5   5/5   Triceps  5/5   5/5   Infraspinatus 5/5  5/5  Medial pectoralis 5/5  5/5  Wrist extensors  5/5   5/5   Wrist flexors  5/5   5/5   Finger extensors  5/5   5/5   Finger flexors  5/5   5/5   Dorsal interossei  5/5   5/5   Abductor pollicis  5/5   5/5   Tone (Ashworth scale)  0  0   Lower Extremity:  Right  Left  Hip flexors  5/5   5/5   Hip extensors  5/5   5/5   Adductor 5/5  5/5  Abductor 5/5  5/5  Knee flexors  5/5   5/5   Knee extensors  5/5   5/5   Dorsiflexors  5/5   5/5   Plantarflexors  5/5   5/5   Toe extensors  5/5   5/5   Toe flexors  5/5   5/5   Tone (Ashworth scale)  0  0   MSRs:  Right        Left                  brachioradialis 2+  2+  biceps 2+  2+  triceps 2+  2+  patellar 2+  0  ankle jerk 0  0  Hoffman no  no  plantar response down  down   SENSORY:  Vibration and temperature reduced below the ankles bilaterally.  Sensation intact in the hands.   COORDINATION/GAIT: Normal finger-to- nose-finger.  Intact rapid alternating movements bilaterally. Gait is  assisted with a walker, stable    IMPRESSION: 1.  Lumbar spinal canal stenosis at L-4 and foraminal stenosis at left L4-5 s/p lumbar decompression (2018)  2.  Chronic compression fracture of the vertebral spine with chronic low back pain 3.  Left leg pain, described as achy. She denies radicular symptoms, which would be expected  with nerve involvement.  4.  Peripheral neuropathy, alcohol-induced, manifesting with distal numbness and gait unsteadiness.  Patient denies any current alcohol consumption.   PLAN/RECOMMENDATIONS:  I discussed that I do not see that her ongoing low back pain or left leg pain is truly nerve related.  I have personally viewed her MRI from 05/2020 which does not show any new or ongoing nerve impingement.  Pain is described as throbbing/achy, suggestive of musculoskeletal pain.  She may benefit from seeing pain management and a trial of steroid injections.  NCS/EMG of the left leg was offered. She would like to think about it and contact the office, if she would like to proceed.  I think it will probably show some neuropathy chronic lumbar radiculopathy, but unlikely to change the course of management, since it remains pain management and PT which she has completed.   Total time spent reviewing records, interview, history/exam, counseling, documentation, and coordination of care on day of encounter:  50 min      Thank you for allowing me to participate in patient's care.  If I can answer any additional questions, I would be pleased to do so.    Sincerely,    Donika K. Posey Pronto, DO

## 2020-12-22 NOTE — Patient Instructions (Addendum)
If you would like to proceed with nerve testing of the left leg, please contact my office to schedule  You may also benefit from seeing pain management - please discuss this with your primary care doctor

## 2020-12-27 ENCOUNTER — Other Ambulatory Visit: Payer: Self-pay

## 2020-12-27 ENCOUNTER — Ambulatory Visit (INDEPENDENT_AMBULATORY_CARE_PROVIDER_SITE_OTHER): Payer: Medicare HMO | Admitting: Family Medicine

## 2020-12-27 ENCOUNTER — Encounter: Payer: Self-pay | Admitting: Family Medicine

## 2020-12-27 DIAGNOSIS — M1990 Unspecified osteoarthritis, unspecified site: Secondary | ICD-10-CM

## 2020-12-27 DIAGNOSIS — M5416 Radiculopathy, lumbar region: Secondary | ICD-10-CM | POA: Diagnosis not present

## 2020-12-27 DIAGNOSIS — N182 Chronic kidney disease, stage 2 (mild): Secondary | ICD-10-CM | POA: Diagnosis not present

## 2020-12-27 MED ORDER — PREGABALIN 50 MG PO CAPS: ORAL_CAPSULE | ORAL | 0 refills | Status: DC

## 2020-12-27 NOTE — Progress Notes (Signed)
Subjective:    Patient ID: [name], female    DOB: [birth year], 75 y.o.   MRN: [id number]   HPI: [name] is a 75 y.o. female presenting for neuropathy pain. It is about the same. "I hurt." back & down the left leg.Moderately severe. Saw Dr. Posey Pronto of neurology from our referral last week. She feels pain is neuropathic due to radiculopathy brought on by compressio fractures as well as being from alcohol based neuropathy. She offerred PNCV and EMG. Pt. Decided to wait until she spoke to me to decide about the test. Pain clinic and ESI were also recommended. Pt. Reports there was no change in her pain with the left knee injection last month for the arthritis of the knee   About to marry her fiance and move into a new house. Single level, in [place].    Depression screen [facility] 2/9 11/25/2020 11/22/2020 11/02/2020 09/07/2020 04/26/2020  Decreased Interest 0 0 0 0 0  Down, Depressed, Hopeless 0 0 0 0 0  PHQ - 2 Score 0 0 0 0 0  Altered sleeping - - - - -  Tired, decreased energy - - - - -  Change in appetite - - - - -  Feeling bad or failure about yourself  - - - - -  Trouble concentrating - - - - -  Moving slowly or fidgety/restless - - - - -  Suicidal thoughts - - - - -  PHQ-9 Score - - - - -  Some recent data might be hidden     Relevant past medical, surgical, family and social history reviewed and updated as indicated.  Interim medical history since our last visit reviewed. Allergies and medications reviewed and updated.  ROS:  Review of Systems  Constitutional: Positive for activity change and fatigue.  HENT: Negative.   Respiratory: Negative for shortness of breath.   Gastrointestinal: Negative for abdominal pain.  Musculoskeletal: Positive for arthralgias and myalgias.  Neurological: Negative for dizziness.     Social History   Tobacco Use  Smoking Status Former Smoker  . Packs/day: 2.00  . Years: 30.00  . Pack years: 60.00  . Types: Cigarettes  . Quit  date: 06/17/1992  . Years since quitting: 28.5  Smokeless Tobacco Never Used       Objective:     Wt Readings from Last 3 Encounters:  12/22/20 148 lb (67.1 kg)  11/22/20 152 lb 3.2 oz (69 kg)  11/02/20 150 lb (68 kg)     Exam deferred. Pt. Harboring due to COVID 19. Phone visit performed.   Assessment & Plan:   1. Lumbar radiculopathy   2. Arthritis   3. Chronic kidney disease (CKD), stage II (mild)     Meds ordered this encounter  Medications  . pregabalin (LYRICA) 50 MG capsule    Sig: 1 qhs X7 days , then 2 qhs X 7d, then 3 qhs X 7d, then 4 qhs    Dispense:  120 capsule    Refill:  0    No orders of the defined types were placed in this encounter.     Diagnoses and all orders for this visit:  Lumbar radiculopathy  Arthritis  Chronic kidney disease (CKD), stage II (mild)  Other orders -     pregabalin (LYRICA) 50 MG capsule; 1 qhs X7 days , then 2 qhs X 7d, then 3 qhs X 7d, then 4 qhs    Virtual Visit via telephone Note  I discussed  the limitations, risks, security and privacy concerns of performing an evaluation and management service by telephone and the availability of in person appointments. The patient was identified with two identifiers. Pt.expressed understanding and agreed to proceed. Pt. Is at home. Dr. Livia Snellen is in his office.  Follow Up Instructions:   I discussed the assessment and treatment plan with the patient. The patient was provided an opportunity to ask questions and all were answered. The patient agreed with the plan and demonstrated an understanding of the instructions.   The patient was advised to call back or seek an in-person evaluation if the symptoms worsen or if the condition fails to improve as anticipated.   Total minutes including chart review and phone contact time: 27   Follow up plan: Return in about 1 month (around 01/27/2021).  Claretta Fraise, MD [place]

## 2021-01-18 ENCOUNTER — Ambulatory Visit: Payer: Medicare HMO | Admitting: Family Medicine

## 2021-01-20 ENCOUNTER — Encounter: Payer: Self-pay | Admitting: Family Medicine

## 2021-01-26 ENCOUNTER — Encounter: Payer: Self-pay | Admitting: Family Medicine

## 2021-01-26 ENCOUNTER — Ambulatory Visit (INDEPENDENT_AMBULATORY_CARE_PROVIDER_SITE_OTHER): Payer: Medicare HMO | Admitting: Family Medicine

## 2021-01-26 ENCOUNTER — Other Ambulatory Visit: Payer: Self-pay

## 2021-01-26 VITALS — BP 133/74 | HR 78 | Temp 98.1°F | Resp 20 | Ht 68.0 in | Wt 150.2 lb

## 2021-01-26 DIAGNOSIS — E559 Vitamin D deficiency, unspecified: Secondary | ICD-10-CM | POA: Diagnosis not present

## 2021-01-26 DIAGNOSIS — L239 Allergic contact dermatitis, unspecified cause: Secondary | ICD-10-CM

## 2021-01-26 DIAGNOSIS — E039 Hypothyroidism, unspecified: Secondary | ICD-10-CM

## 2021-01-26 DIAGNOSIS — M810 Age-related osteoporosis without current pathological fracture: Secondary | ICD-10-CM

## 2021-01-26 DIAGNOSIS — E782 Mixed hyperlipidemia: Secondary | ICD-10-CM | POA: Diagnosis not present

## 2021-01-26 DIAGNOSIS — I10 Essential (primary) hypertension: Secondary | ICD-10-CM | POA: Diagnosis not present

## 2021-01-26 MED ORDER — BETAMETHASONE SOD PHOS & ACET 6 (3-3) MG/ML IJ SUSP
6.0000 mg | Freq: Once | INTRAMUSCULAR | Status: AC
Start: 2021-01-26 — End: 2021-01-26
  Administered 2021-01-26: 6 mg via INTRAMUSCULAR

## 2021-01-26 MED ORDER — ALENDRONATE SODIUM 70 MG PO TABS: 70.0000 mg | ORAL_TABLET | ORAL | 3 refills | Status: DC

## 2021-01-26 MED ORDER — METOPROLOL SUCCINATE ER 100 MG PO TB24: 100.0000 mg | ORAL_TABLET | Freq: Every day | ORAL | 1 refills | Status: DC

## 2021-01-26 MED ORDER — OLMESARTAN MEDOXOMIL 40 MG PO TABS: 40.0000 mg | ORAL_TABLET | Freq: Every day | ORAL | 1 refills | Status: DC

## 2021-01-26 MED ORDER — ROSUVASTATIN CALCIUM 10 MG PO TABS: 10.0000 mg | ORAL_TABLET | Freq: Every day | ORAL | 1 refills | Status: DC

## 2021-01-26 NOTE — Progress Notes (Signed)
Subjective:  Patient ID: [name], female    DOB: 1946/06/23  Age: 75 y.o. MRN: [id number]  CC: Medical Management of Chronic Issues   HPI [name] presents for itching 2-3 days at torso, a little on legs  Cramping in her hands in the evening. Did not get any relief from the Lyrica or from the [place].  She has borrowed an occasional oxycodone from her significant other.   Follow-up of hypertension. Patient has no history of headache chest pain or shortness of breath or recent cough. Patient also denies symptoms of TIA such as numbness weakness lateralizing. Patient checks  blood pressure at home and has not had any elevated readings recently. Patient denies side effects from his medication. States taking it regularly.  Patient's thyroid labs 2 months ago were mildly into the overactive range.  She denies diarrhea or palpitations.  No excessive jitteriness or hot flashes.  Patient does continue to use vitamin D supplementation due to deficiency in the past.  Additionally she is taking Fosamax for her osteopenia.   Depression screen [facility] 2/9 01/26/2021 11/25/2020 11/22/2020  Decreased Interest 0 0 0  Down, Depressed, Hopeless 0 0 0  PHQ - 2 Score 0 0 0  Altered sleeping - - -  Tired, decreased energy - - -  Change in appetite - - -  Feeling bad or failure about yourself  - - -  Trouble concentrating - - -  Moving slowly or fidgety/restless - - -  Suicidal thoughts - - -  PHQ-9 Score - - -  Some recent data might be hidden    History Evarose has a past medical history of Acute pyelonephritis (10/15/2019), Acute renal failure superimposed on stage 2 chronic kidney disease ([place]) (10/15/2019), Alcoholism ([place]) (01/09/2012), Anemia, Anxiety, Arthritis, B12 deficiency, Carotid artery narrowing (10/03/2012), Chronic kidney disease (CKD), stage II (mild), Depression, Headache(784.0), Hyperlipidemia, Hypertension, Hyponatremia (01/09/2012), Hypothyroid, Menopause, Mini stroke ([place]),  Orthostatic syncope (10/02/2012), Pneumonia, SBO (small bowel obstruction) ([place]) (06/28/2016), Shortness of breath, Stroke ([place]) (2010), and Thrombocytopenia ([place]) (10/15/2019).   She has a past surgical history that includes Appendectomy; Tonsillectomy; Cataract extraction w/PHACO (06/20/2012); Cataract extraction w/PHACO (07/11/2012); Colonoscopy (N/A, 12/17/2015); Esophagogastroduodenoscopy (N/A, 12/17/2015); Agile capsule (N/A, 02/22/2016); Knee arthroscopy (Right, 09/2016); and Lumbar laminectomy/decompression microdiscectomy (N/A, 05/25/2017).   Her family history includes GER disease in her mother; Hypertension in her maternal grandmother; Ulcers in her mother.She reports that she quit smoking about 28 years ago. Her smoking use included cigarettes. She has a 60.00 pack-year smoking history. She has never used smokeless tobacco. She reports that she does not drink alcohol and does not use drugs.    ROS Review of Systems  Constitutional: Negative.   HENT: Negative.   Eyes: Negative for visual disturbance.  Respiratory: Negative for shortness of breath.   Cardiovascular: Negative for chest pain.  Gastrointestinal: Negative for abdominal pain.  Musculoskeletal: Negative for arthralgias.  Skin: Positive for rash.    Objective:  BP 133/74   Pulse 78   Temp 98.1 F (36.7 C) (Temporal)   Resp 20   Ht 5' 8"  (1.727 m)   Wt 150 lb 4 oz (68.2 kg)   LMP 08/21/2013   SpO2 98%   BMI 22.85 kg/m   BP Readings from Last 3 Encounters:  01/26/21 133/74  12/22/20 (!) 182/95  11/22/20 134/72    Wt Readings from Last 3 Encounters:  01/26/21 150 lb 4 oz (68.2 kg)  12/22/20 148 lb (67.1 kg)  11/22/20 152  lb 3.2 oz (69 kg)     Physical Exam Constitutional:      General: She is not in acute distress.    Appearance: She is well-developed.  HENT:     Head: Normocephalic and atraumatic.  Eyes:     Conjunctiva/sclera: Conjunctivae normal.     Pupils: Pupils are equal, round, and reactive to  light.  Neck:     Thyroid: No thyromegaly.  Cardiovascular:     Rate and Rhythm: Normal rate and regular rhythm.     Heart sounds: Normal heart sounds. No murmur heard.   Pulmonary:     Effort: Pulmonary effort is normal. No respiratory distress.     Breath sounds: Normal breath sounds. No wheezing or rales.  Abdominal:     General: Bowel sounds are normal. There is no distension.     Palpations: Abdomen is soft.     Tenderness: There is no abdominal tenderness.  Musculoskeletal:        General: No swelling, tenderness, deformity or signs of injury. Normal range of motion.     Cervical back: Normal range of motion and neck supple.  Lymphadenopathy:     Cervical: No cervical adenopathy.  Skin:    General: Skin is warm and dry.     Findings: Rash (  Minimal maculopapular eruption on the posterior shoulders and left flank.  There is excoriations of 1 to 2 mm in a few of these areas.) present.  Neurological:     Mental Status: She is alert and oriented to person, place, and time.  Psychiatric:        Behavior: Behavior normal.        Thought Content: Thought content normal.        Judgment: Judgment normal.       Assessment & Plan:   Johnelle was seen today for medical management of chronic issues.  Diagnoses and all orders for this visit:  Allergic dermatitis -     CBC with Differential/Platelet -     CMP14+EGFR -     Sedimentation rate -     betamethasone acetate-betamethasone sodium phosphate (CELESTONE) injection 6 mg  Mixed hyperlipidemia -     rosuvastatin (CRESTOR) 10 MG tablet; Take 1 tablet (10 mg total) by mouth daily. -     CBC with Differential/Platelet -     CMP14+EGFR  Accelerated hypertension -     metoprolol succinate (TOPROL-XL) 100 MG 24 hr tablet; Take 1 tablet (100 mg total) by mouth daily. For blood pressure control -     CBC with Differential/Platelet -     CMP14+EGFR  Essential hypertension -     CBC with Differential/Platelet -      CMP14+EGFR  Age-related osteoporosis without current pathological fracture -     alendronate (FOSAMAX) 70 MG tablet; Take 1 tablet (70 mg total) by mouth once a week. Take with a full glass of water on an empty stomach. -     CBC with Differential/Platelet -     CMP14+EGFR  Vitamin D deficiency -     VITAMIN D 25 Hydroxy (Vit-D Deficiency, Fractures)  Hypothyroidism, unspecified type -     TSH + free T4  Other orders -     olmesartan (BENICAR) 40 MG tablet; Take 1 tablet (40 mg total) by mouth daily.       I have discontinued [name]. Price's olmesartan-hydrochlorothiazide, calcitonin (salmon), and pregabalin. I am also having her start on olmesartan. Additionally, I am having her maintain  her aspirin EC, thiamine, folic acid, Vitamin D (Ergocalciferol), levothyroxine, rosuvastatin, metoprolol succinate, and alendronate. We will continue to administer betamethasone acetate-betamethasone sodium phosphate.  Allergies as of 01/26/2021      Reactions   Ciprofloxacin Other (See Comments)   Caused pt to have delusional thoughts and hallucinations   Procardia [nifedipine] Swelling   Edema    Norvasc [amlodipine Besylate] Hives      Medication List       Accurate as of January 26, 2021  4:18 PM. If you have any questions, ask your nurse or doctor.        STOP taking these medications   calcitonin (salmon) 200 UNIT/ACT nasal spray Commonly known as: MIACALCIN/FORTICAL Stopped by: Claretta Fraise, MD   olmesartan-hydrochlorothiazide 40-25 MG tablet Commonly known as: BENICAR HCT Stopped by: Claretta Fraise, MD   pregabalin 50 MG capsule Commonly known as: Lyrica Stopped by: Claretta Fraise, MD     TAKE these medications   alendronate 70 MG tablet Commonly known as: FOSAMAX Take 1 tablet (70 mg total) by mouth once a week. Take with a full glass of water on an empty stomach.   aspirin EC 81 MG tablet Take 81 mg by mouth daily.   folic acid 989 MCG tablet Commonly known  as: FOLVITE Take 2,000 mcg by mouth daily.   levothyroxine 125 MCG tablet Commonly known as: SYNTHROID Take 125 mcg by mouth daily.   metoprolol succinate 100 MG 24 hr tablet Commonly known as: TOPROL-XL Take 1 tablet (100 mg total) by mouth daily. For blood pressure control   olmesartan 40 MG tablet Commonly known as: BENICAR Take 1 tablet (40 mg total) by mouth daily. Started by: Claretta Fraise, MD   rosuvastatin 10 MG tablet Commonly known as: CRESTOR Take 1 tablet (10 mg total) by mouth daily.   thiamine 100 MG tablet Commonly known as: Vitamin B-1 Take 1 tablet (100 mg total) by mouth daily.   Vitamin D (Ergocalciferol) 1.25 MG (50000 UNIT) Caps capsule Commonly known as: DRISDOL Take 1 capsule (50,000 Units total) by mouth every 7 (seven) days.        Follow-up: Return in about 6 months (around 07/26/2021), or if symptoms worsen or fail to improve.  Claretta Fraise, M.D.

## 2021-01-27 LAB — SEDIMENTATION RATE: Sed Rate: 12 mm/hr (ref 0–40)

## 2021-01-27 LAB — CMP14+EGFR
ALT: 10 IU/L (ref 0–32)
AST: 13 IU/L (ref 0–40)
Albumin/Globulin Ratio: 1.9 (ref 1.2–2.2)
Albumin: 4.7 g/dL (ref 3.7–4.7)
Alkaline Phosphatase: 80 IU/L (ref 44–121)
BUN/Creatinine Ratio: 14 (ref 12–28)
BUN: 16 mg/dL (ref 8–27)
Bilirubin Total: 0.4 mg/dL (ref 0.0–1.2)
CO2: 20 mmol/L (ref 20–29)
Calcium: 9.7 mg/dL (ref 8.7–10.3)
Chloride: 97 mmol/L (ref 96–106)
Creatinine, Ser: 1.16 mg/dL — ABNORMAL HIGH (ref 0.57–1.00)
GFR calc Af Amer: 54 mL/min/{1.73_m2} — ABNORMAL LOW (ref >59)
GFR calc non Af Amer: 47 mL/min/{1.73_m2} — ABNORMAL LOW (ref >59)
Globulin, Total: 2.5 g/dL (ref 1.5–4.5)
Glucose: 93 mg/dL (ref 65–99)
Potassium: 4.3 mmol/L (ref 3.5–5.2)
Sodium: 136 mmol/L (ref 134–144)
Total Protein: 7.2 g/dL (ref 6.0–8.5)

## 2021-01-27 LAB — VITAMIN D 25 HYDROXY (VIT D DEFICIENCY, FRACTURES): Vit D, 25-Hydroxy: 57 ng/mL (ref 30.0–100.0)

## 2021-01-27 LAB — CBC WITH DIFFERENTIAL/PLATELET
Basophils Absolute: 0.1 10*3/uL (ref 0.0–0.2)
Basos: 1 %
EOS (ABSOLUTE): 0.3 10*3/uL (ref 0.0–0.4)
Eos: 3 %
Hematocrit: 35.9 % (ref 34.0–46.6)
Hemoglobin: 12.4 g/dL (ref 11.1–15.9)
Immature Grans (Abs): 0.1 10*3/uL (ref 0.0–0.1)
Immature Granulocytes: 1 %
Lymphocytes Absolute: 2.6 10*3/uL (ref 0.7–3.1)
Lymphs: 27 %
MCH: 31.7 pg (ref 26.6–33.0)
MCHC: 34.5 g/dL (ref 31.5–35.7)
MCV: 92 fL (ref 79–97)
Monocytes Absolute: 0.6 10*3/uL (ref 0.1–0.9)
Monocytes: 7 %
Neutrophils Absolute: 6.2 10*3/uL (ref 1.4–7.0)
Neutrophils: 61 %
Platelets: 317 10*3/uL (ref 150–450)
RBC: 3.91 x10E6/uL (ref 3.77–5.28)
RDW: 11.5 % — ABNORMAL LOW (ref 11.7–15.4)
WBC: 9.8 10*3/uL (ref 3.4–10.8)

## 2021-01-27 LAB — TSH+FREE T4
Free T4: 2.34 ng/dL — ABNORMAL HIGH (ref 0.82–1.77)
TSH: 0.03 u[IU]/mL — ABNORMAL LOW (ref 0.450–4.500)

## 2021-01-30 ENCOUNTER — Other Ambulatory Visit: Payer: Self-pay | Admitting: Family Medicine

## 2021-01-30 MED ORDER — LEVOTHYROXINE SODIUM 100 MCG PO TABS: 100.0000 ug | ORAL_TABLET | Freq: Every day | ORAL | 1 refills | Status: DC

## 2021-02-23 NOTE — Chronic Care Management (AMB) (Signed)
  Chronic Care Management   Outreach Note  11/16/2020 Name: [name] MRN: [id number] DOB: 10-12-1946  Referred by: Claretta Fraise, MD Reason for referral : Chronic Care Management (RN follow up)   An unsuccessful follow-up Telephone Visit was attempted today. The patient was referred to the case management team for assistance with care management and care coordination.   RNCM will reach out to patient again over the next 30 days.    Chong Sicilian, BSN, RN-BC Embedded Chronic Care Manager [facility] [facility] Direct Dial: [phone]

## 2021-03-15 DIAGNOSIS — H9209 Otalgia, unspecified ear: Secondary | ICD-10-CM | POA: Diagnosis not present

## 2021-03-15 DIAGNOSIS — R059 Cough, unspecified: Secondary | ICD-10-CM | POA: Diagnosis not present

## 2021-03-15 DIAGNOSIS — H5789 Other specified disorders of eye and adnexa: Secondary | ICD-10-CM | POA: Diagnosis not present

## 2021-03-15 DIAGNOSIS — R432 Parageusia: Secondary | ICD-10-CM | POA: Diagnosis not present

## 2021-03-15 DIAGNOSIS — J029 Acute pharyngitis, unspecified: Secondary | ICD-10-CM | POA: Diagnosis not present

## 2021-03-21 ENCOUNTER — Ambulatory Visit (INDEPENDENT_AMBULATORY_CARE_PROVIDER_SITE_OTHER): Payer: Medicare HMO | Admitting: Nurse Practitioner

## 2021-03-21 DIAGNOSIS — H9202 Otalgia, left ear: Secondary | ICD-10-CM

## 2021-03-21 MED ORDER — FLUTICASONE PROPIONATE 50 MCG/ACT NA SUSP: 2.0000 | Freq: Every day | NASAL | 6 refills | Status: DC

## 2021-03-21 NOTE — Progress Notes (Signed)
   Virtual Visit  Note Due to COVID-19 pandemic this visit was conducted virtually. This visit type was conducted due to national recommendations for restrictions regarding the COVID-19 Pandemic (e.g. social distancing, sheltering in place) in an effort to limit this patient's exposure and mitigate transmission in our community. All issues noted in this document were discussed and addressed.  A physical exam was not performed with this format.  I connected with [name] on 03/21/21 at 1:38 by telephone and verified that I am speaking with the correct person using two identifiers. [name] is currently located at home and no one is currently with her during visit. The provider, Mary-Margaret Hassell Done, FNP is located in their office at time of visit.  I discussed the limitations, risks, security and privacy concerns of performing an evaluation and management service by telephone and the availability of in person appointments. I also discussed with the patient that there may be a patient responsible charge related to this service. The patient expressed understanding and agreed to proceed.   History and Present Illness:   Chief Complaint: URI   HPI Patient does telephone visit for runny eyes. She went to urgent care and was pt on oral antibiotic and eye drops. Eyes have gotten better. But her ear is hurting. She says it is just pounding. She denies any fever.    Review of Systems  Constitutional: Negative for chills and fever.  HENT: Positive for ear pain. Negative for ear discharge.   Respiratory: Negative.   Cardiovascular: Negative.   Genitourinary: Negative.   Neurological: Negative.   Psychiatric/Behavioral: Negative.   All other systems reviewed and are negative.    Observations/Objective: Alert and oriented- answers all questions appropriately No distress    Assessment and Plan: [name] in today with chief complaint of URI   1. Left ear pain Force  fluids claritin D OTC for a week then just plain claritin daily Meds ordered this encounter  Medications  . fluticasone (FLONASE) 50 MCG/ACT nasal spray    Sig: Place 2 sprays into both nostrils daily.    Dispense:  16 g    Refill:  6    Order Specific Question:   Supervising Provider    Answer:   Caryl Pina A [[id number]]        Follow Up Instructions: prn    I discussed the assessment and treatment plan with the patient. The patient was provided an opportunity to ask questions and all were answered. The patient agreed with the plan and demonstrated an understanding of the instructions.   The patient was advised to call back or seek an in-person evaluation if the symptoms worsen or if the condition fails to improve as anticipated.  The above assessment and management plan was discussed with the patient. The patient verbalized understanding of and has agreed to the management plan. Patient is aware to call the clinic if symptoms persist or worsen. Patient is aware when to return to the clinic for a follow-up visit. Patient educated on when it is appropriate to go to the emergency department.   Time call ended:  1:50  I provided 12 minutes of  non face-to-face time during this encounter.    Mary-Margaret Hassell Done, FNP

## 2021-04-25 ENCOUNTER — Other Ambulatory Visit: Payer: Self-pay

## 2021-04-25 ENCOUNTER — Ambulatory Visit (INDEPENDENT_AMBULATORY_CARE_PROVIDER_SITE_OTHER): Payer: Medicare HMO | Admitting: Family Medicine

## 2021-04-25 ENCOUNTER — Encounter: Payer: Self-pay | Admitting: Family Medicine

## 2021-04-25 VITALS — BP 132/69 | HR 80 | Temp 96.9°F | Ht 68.0 in | Wt 137.0 lb

## 2021-04-25 DIAGNOSIS — E782 Mixed hyperlipidemia: Secondary | ICD-10-CM

## 2021-04-25 DIAGNOSIS — M48062 Spinal stenosis, lumbar region with neurogenic claudication: Secondary | ICD-10-CM

## 2021-04-25 DIAGNOSIS — R270 Ataxia, unspecified: Secondary | ICD-10-CM | POA: Diagnosis not present

## 2021-04-25 DIAGNOSIS — I679 Cerebrovascular disease, unspecified: Secondary | ICD-10-CM

## 2021-04-25 DIAGNOSIS — I10 Essential (primary) hypertension: Secondary | ICD-10-CM

## 2021-04-25 DIAGNOSIS — E538 Deficiency of other specified B group vitamins: Secondary | ICD-10-CM

## 2021-04-25 DIAGNOSIS — E038 Other specified hypothyroidism: Secondary | ICD-10-CM

## 2021-04-25 DIAGNOSIS — D500 Iron deficiency anemia secondary to blood loss (chronic): Secondary | ICD-10-CM

## 2021-04-25 NOTE — Progress Notes (Signed)
Subjective:  Patient ID: [name], female    DOB: 07/23/1946  Age: 75 y.o. MRN: [id number]  CC: Medical Management of Chronic Issues   HPI [name] presents for follow-up of elevated cholesterol. Doing well without complaints on current medication. Denies side effects of statin including myalgia and arthralgia and nausea. Also in today for liver function testing. Currently no chest pain, shortness of breath or other cardiovascular related symptoms noted.  Random loss of balance then can stumble. Last fall was 2 months ago. Scared of falling.   History [name] has a past medical history of Acute pyelonephritis (10/15/2019), Acute renal failure superimposed on stage 2 chronic kidney disease ([place]) (10/15/2019), Alcoholism ([place]) (01/09/2012), Anemia, Anxiety, Arthritis, B12 deficiency, Carotid artery narrowing (10/03/2012), Chronic kidney disease (CKD), stage II (mild), Depression, Headache(784.0), Hyperlipidemia, Hypertension, Hyponatremia (01/09/2012), Hypothyroid, Menopause, Mini stroke ([place]), Orthostatic syncope (10/02/2012), Pneumonia, SBO (small bowel obstruction) ([place]) (06/28/2016), Shortness of breath, Stroke ([place]) (2010), and Thrombocytopenia ([place]) (10/15/2019).   She has a past surgical history that includes Appendectomy; Tonsillectomy; Cataract extraction w/PHACO (06/20/2012); Cataract extraction w/PHACO (07/11/2012); Colonoscopy (N/A, 12/17/2015); Esophagogastroduodenoscopy (N/A, 12/17/2015); Agile capsule (N/A, 02/22/2016); Knee arthroscopy (Right, 09/2016); and Lumbar laminectomy/decompression microdiscectomy (N/A, 05/25/2017).   Her family history includes GER disease in her mother; Hypertension in her maternal grandmother; Ulcers in her mother.She reports that she quit smoking about 28 years ago. Her smoking use included cigarettes. She has a 60.00 pack-year smoking history. She has never used smokeless tobacco. She reports that she does not drink alcohol and does not use drugs.  Current  Outpatient Medications on File Prior to Visit  Medication Sig Dispense Refill  . alendronate (FOSAMAX) 70 MG tablet Take 1 tablet (70 mg total) by mouth once a week. Take with a full glass of water on an empty stomach. 13 tablet 3  . aspirin EC 81 MG tablet Take 81 mg by mouth daily.    . fluticasone (FLONASE) 50 MCG/ACT nasal spray Place 2 sprays into both nostrils daily. 16 g 6  . folic acid (FOLVITE) 174 MCG tablet Take 2,000 mcg by mouth daily.    Marland Kitchen levothyroxine (SYNTHROID) 100 MCG tablet Take 1 tablet (100 mcg total) by mouth daily. 90 tablet 1  . metoprolol succinate (TOPROL-XL) 100 MG 24 hr tablet Take 1 tablet (100 mg total) by mouth daily. For blood pressure control 90 tablet 1  . olmesartan (BENICAR) 40 MG tablet Take 1 tablet (40 mg total) by mouth daily. 90 tablet 1  . rosuvastatin (CRESTOR) 10 MG tablet Take 1 tablet (10 mg total) by mouth daily. 90 tablet 1  . thiamine (VITAMIN B-1) 100 MG tablet Take 1 tablet (100 mg total) by mouth daily. 90 tablet 3  . Vitamin D, Ergocalciferol, (DRISDOL) 1.25 MG (50000 UNIT) CAPS capsule Take 1 capsule (50,000 Units total) by mouth every 7 (seven) days. 13 capsule 3   No current facility-administered medications on file prior to visit.    ROS Review of Systems  Constitutional: Negative.   HENT: Negative.   Eyes: Negative for visual disturbance.  Respiratory: Negative for shortness of breath.   Cardiovascular: Negative for chest pain.  Gastrointestinal: Negative for abdominal pain.  Musculoskeletal: Negative for arthralgias.    Objective:  BP 132/69   Pulse 80   Temp (!) 96.9 F (36.1 C)   Ht 5' 8"  (1.727 m)   Wt 137 lb (62.1 kg)   LMP 08/21/2013   SpO2 100%   BMI 20.83 kg/m  BP Readings from Last 3 Encounters:  04/25/21 132/69  01/26/21 133/74  12/22/20 (!) 182/95    Wt Readings from Last 3 Encounters:  04/25/21 137 lb (62.1 kg)  01/26/21 150 lb 4 oz (68.2 kg)  12/22/20 148 lb (67.1 kg)     Physical  Exam Constitutional:      General: She is not in acute distress.    Appearance: She is well-developed.  HENT:     Head: Normocephalic and atraumatic.  Eyes:     Conjunctiva/sclera: Conjunctivae normal.     Pupils: Pupils are equal, round, and reactive to light.  Neck:     Thyroid: No thyromegaly.  Cardiovascular:     Rate and Rhythm: Normal rate and regular rhythm.     Heart sounds: Normal heart sounds. No murmur heard.   Pulmonary:     Effort: Pulmonary effort is normal. No respiratory distress.     Breath sounds: Normal breath sounds. No wheezing or rales.  Abdominal:     General: Bowel sounds are normal. There is no distension.     Palpations: Abdomen is soft.     Tenderness: There is no abdominal tenderness.  Musculoskeletal:        General: Normal range of motion.     Cervical back: Normal range of motion and neck supple.  Lymphadenopathy:     Cervical: No cervical adenopathy.  Skin:    General: Skin is warm and dry.  Neurological:     Mental Status: She is alert and oriented to person, place, and time.     Coordination: Coordination abnormal.     Gait: Gait abnormal (slow, unsteady. Not antalgic or shuffling).  Psychiatric:        Behavior: Behavior normal.        Thought Content: Thought content normal.        Judgment: Judgment normal.     Lab Results  Component Value Date   HGBA1C 4.7 11/13/2013   HGBA1C 5.0 10/22/2013    Lab Results  Component Value Date   WBC 9.8 01/26/2021   HGB 12.4 01/26/2021   HCT 35.9 01/26/2021   PLT 317 01/26/2021   GLUCOSE 93 01/26/2021   CHOL 235 (H) 09/07/2020   TRIG 334 (H) 09/07/2020   HDL 80 09/07/2020   LDLCALC 100 (H) 09/07/2020   ALT 10 01/26/2021   AST 13 01/26/2021   NA 136 01/26/2021   K 4.3 01/26/2021   CL 97 01/26/2021   CREATININE 1.16 (H) 01/26/2021   BUN 16 01/26/2021   CO2 20 01/26/2021   TSH 0.030 (L) 01/26/2021   INR 1.00 05/18/2017   HGBA1C 4.7 11/13/2013    CT Head Wo Contrast  Result  Date: 06/30/2020 CLINICAL DATA:  Head injury, fall EXAM: CT HEAD WITHOUT CONTRAST TECHNIQUE: Contiguous axial images were obtained from the base of the skull through the vertex without intravenous contrast. COMPARISON:  04/22/2015 FINDINGS: Brain: Normal anatomic configuration. Moderate parenchymal volume loss appears stable since prior examination. Extensive periventricular white matter changes are again identified, likely reflecting the sequela of small vessel ischemia, similar to that noted on prior examination. Mild ventriculomegaly appears stable, likely reflecting changes of central atrophy. No abnormal intra or extra-axial mass lesion or fluid collection. No abnormal mass effect or midline shift. No evidence of acute intracranial hemorrhage or infarct. Cerebellum unremarkable. Vascular: Unremarkable Skull: Intact Sinuses/Orbits: Paranasal sinuses are clear. Orbits are unremarkable. Other: Mastoid air cells and middle ear cavities are clear. IMPRESSION: Advanced senescent changes, stable since  prior examination of 04/22/2015. No evidence of acute intracranial injury or calvarial fracture. Electronically Signed   By: Fidela Salisbury MD   On: 06/30/2020 18:09    Assessment & Plan:   Wave was seen today for medical management of chronic issues.  Diagnoses and all orders for this visit:  B12 deficiency -     CBC with Differential/Platelet -     Lipid panel -     CMP14+EGFR  Essential hypertension -     CBC with Differential/Platelet -     Lipid panel -     CMP14+EGFR  Mixed hyperlipidemia -     CBC with Differential/Platelet -     Lipid panel -     CMP14+EGFR  Other specified hypothyroidism -     CBC with Differential/Platelet -     Lipid panel -     CMP14+EGFR -     TSH + free T4  Iron deficiency anemia due to chronic blood loss -     CBC with Differential/Platelet -     Lipid panel -     CMP14+EGFR  Spinal stenosis, lumbar region with neurogenic claudication -     CBC with  Differential/Platelet -     Lipid panel -     CMP14+EGFR  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 Price maintain her aspirin EC, thiamine, folic acid, Vitamin D (Ergocalciferol), rosuvastatin, metoprolol succinate, alendronate, olmesartan, levothyroxine, and fluticasone.  No orders of the defined types were placed in this encounter.    Follow-up: Return in about 3 months (around 07/26/2021).  Claretta Fraise, M.D.

## 2021-04-26 ENCOUNTER — Other Ambulatory Visit: Payer: Self-pay | Admitting: *Deleted

## 2021-04-26 ENCOUNTER — Other Ambulatory Visit: Payer: Medicare HMO

## 2021-04-26 ENCOUNTER — Other Ambulatory Visit: Payer: Self-pay | Admitting: Family Medicine

## 2021-04-26 DIAGNOSIS — E875 Hyperkalemia: Secondary | ICD-10-CM

## 2021-04-26 LAB — BASIC METABOLIC PANEL
BUN/Creatinine Ratio: 21 (ref 12–28)
BUN: 25 mg/dL (ref 8–27)
CO2: 20 mmol/L (ref 20–29)
Calcium: 9.4 mg/dL (ref 8.7–10.3)
Chloride: 99 mmol/L (ref 96–106)
Creatinine, Ser: 1.21 mg/dL — ABNORMAL HIGH (ref 0.57–1.00)
Glucose: 89 mg/dL (ref 65–99)
Potassium: 5.9 mmol/L (ref 3.5–5.2)
Sodium: 135 mmol/L (ref 134–144)
eGFR: 47 mL/min/{1.73_m2} — ABNORMAL LOW (ref >59)

## 2021-04-26 MED ORDER — LEVOTHYROXINE SODIUM 50 MCG PO TABS: 50.0000 ug | ORAL_TABLET | Freq: Every day | ORAL | 1 refills | Status: DC

## 2021-04-26 NOTE — Progress Notes (Signed)
See separate lab results regarding the potassium.  Otherwise, the thyroid blood test shows overactivity.  She will need to decrease the dose by half.  I sent in a prescription for that dose.  She will need to follow-up in 6 to 8 weeks to determine if that is the new best dose for her.

## 2021-04-27 LAB — CBC WITH DIFFERENTIAL/PLATELET
Basophils Absolute: 0 10*3/uL (ref 0.0–0.2)
Basos: 1 %
EOS (ABSOLUTE): 0.1 10*3/uL (ref 0.0–0.4)
Eos: 2 %
Hematocrit: 36.6 % (ref 34.0–46.6)
Hemoglobin: 12.2 g/dL (ref 11.1–15.9)
Immature Grans (Abs): 0 10*3/uL (ref 0.0–0.1)
Immature Granulocytes: 1 %
Lymphocytes Absolute: 2.2 10*3/uL (ref 0.7–3.1)
Lymphs: 33 %
MCH: 29 pg (ref 26.6–33.0)
MCHC: 33.3 g/dL (ref 31.5–35.7)
MCV: 87 fL (ref 79–97)
Monocytes Absolute: 0.6 10*3/uL (ref 0.1–0.9)
Monocytes: 9 %
Neutrophils Absolute: 3.7 10*3/uL (ref 1.4–7.0)
Neutrophils: 54 %
Platelets: 376 10*3/uL (ref 150–450)
RBC: 4.21 x10E6/uL (ref 3.77–5.28)
RDW: 13.3 % (ref 11.7–15.4)
WBC: 6.7 10*3/uL (ref 3.4–10.8)

## 2021-04-27 LAB — LIPID PANEL
Chol/HDL Ratio: 2.6 ratio (ref 0.0–4.4)
Cholesterol, Total: 130 mg/dL (ref 100–199)
HDL: 50 mg/dL (ref >39)
LDL Chol Calc (NIH): 46 mg/dL (ref 0–99)
Triglycerides: 216 mg/dL — ABNORMAL HIGH (ref 0–149)
VLDL Cholesterol Cal: 34 mg/dL (ref 5–40)

## 2021-04-27 LAB — CMP14+EGFR
ALT: 5 IU/L (ref 0–32)
AST: 9 IU/L (ref 0–40)
Albumin/Globulin Ratio: 1.6 (ref 1.2–2.2)
Albumin: 4.5 g/dL (ref 3.7–4.7)
Alkaline Phosphatase: 82 IU/L (ref 44–121)
BUN/Creatinine Ratio: 19 (ref 12–28)
BUN: 25 mg/dL (ref 8–27)
Bilirubin Total: 0.5 mg/dL (ref 0.0–1.2)
CO2: 18 mmol/L — ABNORMAL LOW (ref 20–29)
Calcium: 10.1 mg/dL (ref 8.7–10.3)
Chloride: 100 mmol/L (ref 96–106)
Creatinine, Ser: 1.33 mg/dL — ABNORMAL HIGH (ref 0.57–1.00)
Globulin, Total: 2.9 g/dL (ref 1.5–4.5)
Glucose: 101 mg/dL — ABNORMAL HIGH (ref 65–99)
Potassium: 6.2 mmol/L (ref 3.5–5.2)
Sodium: 134 mmol/L (ref 134–144)
Total Protein: 7.4 g/dL (ref 6.0–8.5)
eGFR: 42 mL/min/{1.73_m2} — ABNORMAL LOW (ref >59)

## 2021-04-27 LAB — TSH+FREE T4
Free T4: 4.58 ng/dL — ABNORMAL HIGH (ref 0.82–1.77)
TSH: <0.005 u[IU]/mL — ABNORMAL LOW (ref 0.450–4.500)

## 2021-04-27 LAB — SEDIMENTATION RATE: Sed Rate: 3 mm/hr (ref 0–40)

## 2021-04-28 ENCOUNTER — Other Ambulatory Visit: Payer: Self-pay | Admitting: Family Medicine

## 2021-04-28 MED ORDER — LEVOTHYROXINE SODIUM 100 MCG PO TABS: 100.0000 ug | ORAL_TABLET | Freq: Every day | ORAL | 1 refills | Status: DC

## 2021-05-05 ENCOUNTER — Ambulatory Visit: Payer: Medicare HMO | Admitting: Family Medicine

## 2021-05-10 ENCOUNTER — Other Ambulatory Visit: Payer: Self-pay

## 2021-05-10 ENCOUNTER — Ambulatory Visit (HOSPITAL_COMMUNITY)
Admission: RE | Admit: 2021-05-10 | Discharge: 2021-05-10 | Disposition: A | Discharge status: Home / Self Care | Payer: Medicare HMO | Source: Ambulatory Visit | Attending: Family Medicine | Admitting: Family Medicine

## 2021-05-10 DIAGNOSIS — R270 Ataxia, unspecified: Secondary | ICD-10-CM | POA: Diagnosis not present

## 2021-05-10 DIAGNOSIS — I679 Cerebrovascular disease, unspecified: Secondary | ICD-10-CM | POA: Diagnosis not present

## 2021-05-10 DIAGNOSIS — R42 Dizziness and giddiness: Secondary | ICD-10-CM | POA: Diagnosis not present

## 2021-05-12 ENCOUNTER — Other Ambulatory Visit: Payer: Self-pay | Admitting: Family Medicine

## 2021-05-12 DIAGNOSIS — Z1231 Encounter for screening mammogram for malignant neoplasm of breast: Secondary | ICD-10-CM

## 2021-05-13 ENCOUNTER — Encounter: Payer: Self-pay | Admitting: Family Medicine

## 2021-05-17 ENCOUNTER — Encounter: Payer: Self-pay | Admitting: Family Medicine

## 2021-05-17 ENCOUNTER — Ambulatory Visit (INDEPENDENT_AMBULATORY_CARE_PROVIDER_SITE_OTHER): Payer: Medicare HMO | Admitting: Family Medicine

## 2021-05-17 ENCOUNTER — Other Ambulatory Visit: Payer: Self-pay

## 2021-05-17 VITALS — BP 154/69 | HR 73 | Temp 97.8°F | Ht 68.0 in | Wt 134.0 lb

## 2021-05-17 DIAGNOSIS — I10 Essential (primary) hypertension: Secondary | ICD-10-CM

## 2021-05-17 DIAGNOSIS — E038 Other specified hypothyroidism: Secondary | ICD-10-CM

## 2021-05-17 DIAGNOSIS — M48062 Spinal stenosis, lumbar region with neurogenic claudication: Secondary | ICD-10-CM | POA: Diagnosis not present

## 2021-05-17 DIAGNOSIS — M545 Low back pain, unspecified: Secondary | ICD-10-CM | POA: Diagnosis not present

## 2021-05-17 MED ORDER — PREGABALIN 50 MG PO CAPS: ORAL_CAPSULE | ORAL | 0 refills | Status: DC

## 2021-05-17 MED ORDER — AMOXICILLIN-POT CLAVULANATE 875-125 MG PO TABS: 1.0000 | ORAL_TABLET | Freq: Two times a day (BID) | ORAL | 1 refills | Status: DC

## 2021-05-17 NOTE — Progress Notes (Signed)
Subjective:  Patient ID: [name], female    DOB: 1946/04/20  Age: 75 y.o. MRN: [id number]  CC: Follow-up (Potassium and Hypertension/)   HPI [name] presents for recheck of potassium. It has been elevated repeatedly.  presents for  follow-up of hypertension. Patient has no history of headache chest pain or shortness of breath or recent cough. Patient also denies symptoms of TIA such as focal numbness or weakness. Patient denies side effects from medication. States taking it regularly.   follow-up on  thyroid. The patient has a history of hypothyroidism for many years. It has been stable recently. Pt. denies any change in  voice, loss of hair, heat or cold intolerance. Energy level has been adequate to good. Patient denies constipation and diarrhea. No myxedema. Medication is as noted below. Verified that pt is taking it daily on an empty stomach. Well tolerated.    Depression screen [facility] 2/9 05/17/2021 04/25/2021 04/25/2021  Decreased Interest 0 0 0  Down, Depressed, Hopeless 0 0 0  PHQ - 2 Score 0 0 0  Altered sleeping 0 0 -  Tired, decreased energy 1 1 -  Change in appetite 0 2 -  Feeling bad or failure about yourself  0 0 -  Trouble concentrating 0 0 -  Moving slowly or fidgety/restless 0 0 -  Suicidal thoughts 0 0 -  PHQ-9 Score 1 3 -  Difficult doing work/chores Somewhat difficult Somewhat difficult -  Some recent data might be hidden    History [name] has a past medical history of Acute pyelonephritis (10/15/2019), Acute renal failure superimposed on stage 2 chronic kidney disease ([place]) (10/15/2019), Alcoholism ([place]) (01/09/2012), Anemia, Anxiety, Arthritis, B12 deficiency, Carotid artery narrowing (10/03/2012), Chronic kidney disease (CKD), stage II (mild), Depression, Headache(784.0), Hyperlipidemia, Hypertension, Hyponatremia (01/09/2012), Hypothyroid, Menopause, Mini stroke ([place]), Orthostatic syncope (10/02/2012), Pneumonia, SBO (small bowel obstruction) ([place])  (06/28/2016), Shortness of breath, Stroke ([place]) (2010), and Thrombocytopenia ([place]) (10/15/2019).   [name] has a past surgical history that includes Appendectomy; Tonsillectomy; Cataract extraction w/PHACO (06/20/2012); Cataract extraction w/PHACO (07/11/2012); Colonoscopy (N/A, 12/17/2015); Esophagogastroduodenoscopy (N/A, 12/17/2015); Agile capsule (N/A, 02/22/2016); Knee arthroscopy (Right, 09/2016); and Lumbar laminectomy/decompression microdiscectomy (N/A, 05/25/2017).   [name] family history includes GER disease in [name] mother; Hypertension in [name] maternal grandmother; Ulcers in [name] mother.[name] reports that [name] quit smoking about 28 years ago. [name] smoking use included cigarettes. [name] has a 60.00 pack-year smoking history. [name] has never used smokeless tobacco. [name] reports that [name] does not drink alcohol and does not use drugs.    ROS Review of Systems  Constitutional: Negative.   HENT: Negative.   Eyes: Negative for visual disturbance.  Respiratory: Negative for shortness of breath.   Cardiovascular: Negative for chest pain.  Gastrointestinal: Negative for abdominal pain.  Musculoskeletal: Positive for back pain (lumbar, chronic, stable currently. ). Negative for arthralgias.    Objective:  BP (!) 154/69   Pulse 73   Temp 97.8 F (36.6 C)   Ht 5' 8"  (1.727 m)   Wt 134 lb (60.8 kg)   LMP 08/21/2013   SpO2 100%   BMI 20.37 kg/m   BP Readings from Last 3 Encounters:  05/17/21 (!) 154/69  04/25/21 132/69  01/26/21 133/74    Wt Readings from Last 3 Encounters:  05/17/21 134 lb (60.8 kg)  04/25/21 137 lb (62.1 kg)  01/26/21 150 lb 4 oz (68.2 kg)     Physical Exam Constitutional:      General: [name] is not in acute  distress.    Appearance: [name] is well-developed.  HENT:     Head: Normocephalic and atraumatic.  Eyes:     Conjunctiva/sclera: Conjunctivae normal.     Pupils: Pupils are equal, round, and reactive to light.  Neck:     Thyroid: No thyromegaly.  Cardiovascular:     Rate and  Rhythm: Normal rate and regular rhythm.     Heart sounds: Normal heart sounds. No murmur heard.   Pulmonary:     Effort: Pulmonary effort is normal. No respiratory distress.     Breath sounds: Normal breath sounds. No wheezing or rales.  Abdominal:     General: Bowel sounds are normal. There is no distension.     Palpations: Abdomen is soft.     Tenderness: There is no abdominal tenderness.  Musculoskeletal:        General: Normal range of motion.     Cervical back: Normal range of motion and neck supple.  Lymphadenopathy:     Cervical: No cervical adenopathy.  Skin:    General: Skin is warm and dry.  Neurological:     Mental Status: [name] is alert and oriented to person, place, and time.  Psychiatric:        Behavior: Behavior normal.        Thought Content: Thought content normal.        Judgment: Judgment normal.       Assessment & Plan:   A[place]a was seen today for follow-up.  Diagnoses and all orders for this visit:  Other specified hypothyroidism  Lumbar pain -     pregabalin (LYRICA) 50 MG capsule; 1 qhs X7 days , then 2 qhs X 7d, then 3 qhs X 7d, then 4 qhs  Essential hypertension -     BMP8+EGFR  Spinal stenosis, lumbar region with neurogenic claudication -     pregabalin (LYRICA) 50 MG capsule; 1 qhs X7 days , then 2 qhs X 7d, then 3 qhs X 7d, then 4 qhs  Other orders -     amoxicillin-clavulanate (AUGMENTIN) 875-125 MG tablet; Take 1 tablet by mouth 2 (two) times daily. Take all of this medication       I am having [name]. Price start on amoxicillin-clavulanate. I am also having [name] maintain [name] aspirin EC, thiamine, folic acid, Vitamin D (Ergocalciferol), rosuvastatin, metoprolol succinate, alendronate, fluticasone, levothyroxine, and pregabalin.  Allergies as of 05/17/2021      Reactions   Ciprofloxacin Other (See Comments)   Caused pt to have delusional thoughts and hallucinations   Procardia [nifedipine] Swelling   Edema    Norvasc [amlodipine  Besylate] Hives      Medication List       Accurate as of May 17, 2021 11:59 PM. If you have any questions, ask your nurse or doctor.        alendronate 70 MG tablet Commonly known as: FOSAMAX Take 1 tablet (70 mg total) by mouth once a week. Take with a full glass of water on an empty stomach.   amoxicillin-clavulanate 875-125 MG tablet Commonly known as: AUGMENTIN Take 1 tablet by mouth 2 (two) times daily. Take all of this medication Started by: Claretta Fraise, MD   aspirin EC 81 MG tablet Take 81 mg by mouth daily.   fluticasone 50 MCG/ACT nasal spray Commonly known as: FLONASE Place 2 sprays into both nostrils daily.   folic acid 161 MCG tablet Commonly known as: FOLVITE Take 2,000 mcg by mouth daily.   levothyroxine 100 MCG  tablet Commonly known as: SYNTHROID Take 1 tablet (100 mcg total) by mouth daily.   metoprolol succinate 100 MG 24 hr tablet Commonly known as: TOPROL-XL Take 1 tablet (100 mg total) by mouth daily. For blood pressure control   olmesartan 40 MG tablet Commonly known as: BENICAR Take 1 tablet (40 mg total) by mouth daily.   pregabalin 50 MG capsule Commonly known as: Lyrica 1 qhs X7 days , then 2 qhs X 7d, then 3 qhs X 7d, then 4 qhs Started by: Claretta Fraise, MD   rosuvastatin 10 MG tablet Commonly known as: CRESTOR Take 1 tablet (10 mg total) by mouth daily.   thiamine 100 MG tablet Commonly known as: Vitamin B-1 Take 1 tablet (100 mg total) by mouth daily.   Vitamin D (Ergocalciferol) 1.25 MG (50000 UNIT) Caps capsule Commonly known as: DRISDOL Take 1 capsule (50,000 Units total) by mouth every 7 (seven) days.        Follow-up: Return in about 1 month (around 06/16/2021).  Claretta Fraise, M.D.

## 2021-05-18 ENCOUNTER — Other Ambulatory Visit: Payer: Self-pay | Admitting: Family Medicine

## 2021-05-18 ENCOUNTER — Encounter: Payer: Self-pay | Admitting: Family Medicine

## 2021-05-18 LAB — BMP8+EGFR
BUN/Creatinine Ratio: 13 (ref 12–28)
BUN: 18 mg/dL (ref 8–27)
CO2: 18 mmol/L — ABNORMAL LOW (ref 20–29)
Calcium: 9.8 mg/dL (ref 8.7–10.3)
Chloride: 100 mmol/L (ref 96–106)
Creatinine, Ser: 1.39 mg/dL — ABNORMAL HIGH (ref 0.57–1.00)
Glucose: 78 mg/dL (ref 65–99)
Potassium: 6 mmol/L (ref 3.5–5.2)
Sodium: 139 mmol/L (ref 134–144)
eGFR: 40 mL/min/1.73 — ABNORMAL LOW

## 2021-05-18 MED ORDER — OLMESARTAN MEDOXOMIL 20 MG PO TABS: 20.0000 mg | ORAL_TABLET | Freq: Every day | ORAL | 0 refills | Status: DC

## 2021-05-19 ENCOUNTER — Ambulatory Visit
Admission: RE | Admit: 2021-05-19 | Discharge: 2021-05-19 | Disposition: A | Discharge status: Home / Self Care | Payer: Medicare HMO | Source: Ambulatory Visit | Attending: Family Medicine | Admitting: Family Medicine

## 2021-05-19 ENCOUNTER — Other Ambulatory Visit: Payer: Self-pay

## 2021-05-19 ENCOUNTER — Encounter: Payer: Self-pay | Admitting: *Deleted

## 2021-05-19 DIAGNOSIS — Z1231 Encounter for screening mammogram for malignant neoplasm of breast: Secondary | ICD-10-CM

## 2021-05-19 NOTE — Progress Notes (Signed)
Patient aware.

## 2021-06-01 ENCOUNTER — Other Ambulatory Visit: Payer: Self-pay | Admitting: *Deleted

## 2021-06-01 DIAGNOSIS — I10 Essential (primary) hypertension: Secondary | ICD-10-CM

## 2021-06-01 DIAGNOSIS — M48062 Spinal stenosis, lumbar region with neurogenic claudication: Secondary | ICD-10-CM

## 2021-06-01 DIAGNOSIS — M545 Low back pain, unspecified: Secondary | ICD-10-CM

## 2021-06-01 DIAGNOSIS — R300 Dysuria: Secondary | ICD-10-CM

## 2021-06-01 MED ORDER — METOPROLOL SUCCINATE ER 100 MG PO TB24: 100.0000 mg | ORAL_TABLET | Freq: Every day | ORAL | 1 refills | Status: DC

## 2021-06-01 NOTE — Telephone Encounter (Signed)
Fax from [facility]) RF request for Duloxetine 60 mg & Nabemetone 500 mg Not on current med list. Last OV 05/17/21 Next OV 06/20/21

## 2021-06-13 ENCOUNTER — Other Ambulatory Visit: Payer: Self-pay | Admitting: Family Medicine

## 2021-06-13 DIAGNOSIS — E782 Mixed hyperlipidemia: Secondary | ICD-10-CM

## 2021-06-16 ENCOUNTER — Ambulatory Visit: Payer: Medicare HMO | Admitting: Family Medicine

## 2021-06-16 ENCOUNTER — Telehealth: Payer: Self-pay | Admitting: Family Medicine

## 2021-06-16 NOTE — Telephone Encounter (Signed)
Her dose was reduced to 20 mg daily based on her blood work done at her office visit last time.  Her potassium at that time was too high.  The olmesartan at the higher dose can contribute to that.  So I had her reduce the dose.  She should be taking 20 mg a day now.  If she cannot break her current 40s in half then I can call in a refill.  Otherwise I will see her Monday the 11th as scheduled.

## 2021-06-16 NOTE — Telephone Encounter (Signed)
Patient aware.

## 2021-06-20 ENCOUNTER — Ambulatory Visit (INDEPENDENT_AMBULATORY_CARE_PROVIDER_SITE_OTHER): Payer: Medicare HMO | Admitting: Family Medicine

## 2021-06-20 ENCOUNTER — Other Ambulatory Visit: Payer: Self-pay

## 2021-06-20 VITALS — BP 83/50 | HR 68 | Temp 97.7°F | Ht 68.0 in | Wt 130.0 lb

## 2021-06-20 DIAGNOSIS — E875 Hyperkalemia: Secondary | ICD-10-CM | POA: Diagnosis not present

## 2021-06-20 DIAGNOSIS — M48062 Spinal stenosis, lumbar region with neurogenic claudication: Secondary | ICD-10-CM

## 2021-06-20 DIAGNOSIS — M1710 Unilateral primary osteoarthritis, unspecified knee: Secondary | ICD-10-CM | POA: Diagnosis not present

## 2021-06-20 DIAGNOSIS — M545 Low back pain, unspecified: Secondary | ICD-10-CM

## 2021-06-20 NOTE — Progress Notes (Signed)
Subjective:  Patient ID: [name], female    DOB: October 31, 1946  Age: 75 y.o. MRN: [id number]  CC: Knee Pain and Back Pain   HPI [name] presents for left knee pain getting worse by the day. Pain is 7-8/10. Throughout the joint line.    Continues to  have lumbar Back pain. It makes it almost impossible to stand up straight. 7-8/10, sometimes worse. Onset about a month ago. Back pain increasing since "the guy worked on it."   Recently had hyperkalemia. Her Benicar dose was decreased. In today for recheck of BP & of potassium.  Depression screen [facility] 2/9 05/17/2021 04/25/2021 04/25/2021  Decreased Interest 0 0 0  Down, Depressed, Hopeless 0 0 0  PHQ - 2 Score 0 0 0  Altered sleeping 0 0 -  Tired, decreased energy 1 1 -  Change in appetite 0 2 -  Feeling bad or failure about yourself  0 0 -  Trouble concentrating 0 0 -  Moving slowly or fidgety/restless 0 0 -  Suicidal thoughts 0 0 -  PHQ-9 Score 1 3 -  Difficult doing work/chores Somewhat difficult Somewhat difficult -  Some recent data might be hidden    History [name] has a past medical history of Acute pyelonephritis (10/15/2019), Acute renal failure superimposed on stage 2 chronic kidney disease ([place]) (10/15/2019), Alcoholism ([place]) (01/09/2012), Anemia, Anxiety, Arthritis, B12 deficiency, Carotid artery narrowing (10/03/2012), Chronic kidney disease (CKD), stage II (mild), Depression, Headache(784.0), Hyperlipidemia, Hypertension, Hyponatremia (01/09/2012), Hypothyroid, Menopause, Mini stroke ([place]), Orthostatic syncope (10/02/2012), Pneumonia, SBO (small bowel obstruction) ([place]) (06/28/2016), Shortness of breath, Stroke ([place]) (2010), and Thrombocytopenia ([place]) (10/15/2019).   She has a past surgical history that includes Appendectomy; Tonsillectomy; Cataract extraction w/PHACO (06/20/2012); Cataract extraction w/PHACO (07/11/2012); Colonoscopy (N/A, 12/17/2015); Esophagogastroduodenoscopy (N/A, 12/17/2015); Agile capsule (N/A,  02/22/2016); Knee arthroscopy (Right, 09/2016); and Lumbar laminectomy/decompression microdiscectomy (N/A, 05/25/2017).   Her family history includes GER disease in her mother; Hypertension in her maternal grandmother; Ulcers in her mother.She reports that she quit smoking about 29 years ago. Her smoking use included cigarettes. She has a 60.00 pack-year smoking history. She has never used smokeless tobacco. She reports that she does not drink alcohol and does not use drugs.    ROS Review of Systems  Constitutional: Negative.   HENT: Negative.    Eyes:  Negative for visual disturbance.  Respiratory:  Negative for shortness of breath.   Cardiovascular:  Negative for chest pain.  Gastrointestinal:  Negative for abdominal pain.  Musculoskeletal:  Positive for arthralgias and back pain.   Objective:  BP (!) 83/50   Pulse 68   Temp 97.7 F (36.5 C)   Ht 5' 8"  (1.727 m)   Wt 130 lb (59 kg)   LMP 08/21/2013   SpO2 100%   BMI 19.77 kg/m   BP Readings from Last 3 Encounters:  06/20/21 (!) 83/50  05/17/21 (!) 154/69  04/25/21 132/69    Wt Readings from Last 3 Encounters:  06/20/21 130 lb (59 kg)  05/17/21 134 lb (60.8 kg)  04/25/21 137 lb (62.1 kg)     Physical Exam Constitutional:      General: She is not in acute distress.    Appearance: She is well-developed.  Cardiovascular:     Rate and Rhythm: Normal rate and regular rhythm.  Pulmonary:     Breath sounds: Normal breath sounds.  Musculoskeletal:        General: Tenderness (for ROMat left knee & for spinal flexion)  present. Normal range of motion.  Skin:    General: Skin is warm and dry.  Neurological:     Mental Status: She is alert and oriented to person, place, and time.   Results for orders placed or performed in visit on 06/20/21  [facility]  Result Value Ref Range   Glucose 117 (H) 65 - 99 mg/dL   BUN 22 8 - 27 mg/dL   Creatinine, Ser 1.55 (H) 0.57 - 1.00 mg/dL   eGFR 35 (L) >59 mL/min/1.73   BUN/Creatinine  Ratio 14 12 - 28   Sodium 134 134 - 144 mmol/L   Potassium 5.2 3.5 - 5.2 mmol/L   Chloride 98 96 - 106 mmol/L   CO2 19 (L) 20 - 29 mmol/L   Calcium 9.4 8.7 - 10.3 mg/dL      Assessment & Plan:   [name] was seen today for knee pain and back pain.  Diagnoses and all orders for this visit:  Lumbar pain -     DG Lumbar Spine 2-3 Views; Future  Spinal stenosis, lumbar region with neurogenic claudication -     DG Lumbar Spine 2-3 Views; Future  Arthritis of knee -     DG Knee 1-2 Views Left; Future -     Ambulatory referral to Orthopedics  Hyperkalemia -     BMP8+EGFR      I have discontinued [name]. [name]'s amoxicillin-clavulanate. I am also having her maintain her aspirin EC, thiamine, folic acid, Vitamin D (Ergocalciferol), alendronate, fluticasone, levothyroxine, pregabalin, olmesartan, metoprolol succinate, and rosuvastatin.  Allergies as of 06/20/2021       Reactions   Ciprofloxacin Other (See Comments)   Caused pt to have delusional thoughts and hallucinations   Procardia [nifedipine] Swelling   Edema    Norvasc [amlodipine Besylate] Hives        Medication List        Accurate as of June 20, 2021 11:59 PM. If you have any questions, ask your nurse or doctor.          STOP taking these medications    amoxicillin-clavulanate 875-125 MG tablet Commonly known as: AUGMENTIN Stopped by: Claretta Fraise, MD       TAKE these medications    alendronate 70 MG tablet Commonly known as: FOSAMAX Take 1 tablet (70 mg total) by mouth once a week. Take with a full glass of water on an empty stomach.   aspirin EC 81 MG tablet Take 81 mg by mouth daily.   fluticasone 50 MCG/ACT nasal spray Commonly known as: FLONASE Place 2 sprays into both nostrils daily.   folic acid 916 MCG tablet Commonly known as: FOLVITE Take 2,000 mcg by mouth daily.   levothyroxine 100 MCG tablet Commonly known as: SYNTHROID Take 1 tablet (100 mcg total) by mouth daily.    metoprolol succinate 100 MG 24 hr tablet Commonly known as: TOPROL-XL Take 1 tablet (100 mg total) by mouth daily. For blood pressure control   olmesartan 20 MG tablet Commonly known as: BENICAR Take 1 tablet (20 mg total) by mouth daily. What changed: Another medication with the same name was removed. Continue taking this medication, and follow the directions you see here. Changed by: Claretta Fraise, MD   pregabalin 50 MG capsule Commonly known as: Lyrica 1 qhs X7 days , then 2 qhs X 7d, then 3 qhs X 7d, then 4 qhs   rosuvastatin 10 MG tablet Commonly known as: CRESTOR TAKE 1 TABLET EVERY DAY   thiamine 100  MG tablet Commonly known as: Vitamin B-1 Take 1 tablet (100 mg total) by mouth daily.   Vitamin D (Ergocalciferol) 1.25 MG (50000 UNIT) Caps capsule Commonly known as: DRISDOL Take 1 capsule (50,000 Units total) by mouth every 7 (seven) days.         Follow-up: No follow-ups on file.  Claretta Fraise, M.D.

## 2021-06-21 ENCOUNTER — Encounter: Payer: Self-pay | Admitting: Family Medicine

## 2021-06-21 LAB — BMP8+EGFR
BUN/Creatinine Ratio: 14 (ref 12–28)
BUN: 22 mg/dL (ref 8–27)
CO2: 19 mmol/L — ABNORMAL LOW (ref 20–29)
Calcium: 9.4 mg/dL (ref 8.7–10.3)
Chloride: 98 mmol/L (ref 96–106)
Creatinine, Ser: 1.55 mg/dL — ABNORMAL HIGH (ref 0.57–1.00)
Glucose: 117 mg/dL — ABNORMAL HIGH (ref 65–99)
Potassium: 5.2 mmol/L (ref 3.5–5.2)
Sodium: 134 mmol/L (ref 134–144)
eGFR: 35 mL/min/{1.73_m2} — ABNORMAL LOW (ref >59)

## 2021-06-21 NOTE — Progress Notes (Signed)
Hello Cecilie,  Your lab result is normal and/or stable.Some minor variations that are not significant are commonly marked abnormal, but do not represent any medical problem for you.  Best regards, Mackinze Criado, M.D.

## 2021-06-23 ENCOUNTER — Other Ambulatory Visit (INDEPENDENT_AMBULATORY_CARE_PROVIDER_SITE_OTHER): Payer: Medicare HMO

## 2021-06-23 ENCOUNTER — Other Ambulatory Visit: Payer: Self-pay

## 2021-06-23 DIAGNOSIS — M1710 Unilateral primary osteoarthritis, unspecified knee: Secondary | ICD-10-CM

## 2021-06-23 DIAGNOSIS — M48062 Spinal stenosis, lumbar region with neurogenic claudication: Secondary | ICD-10-CM

## 2021-06-23 DIAGNOSIS — M25562 Pain in left knee: Secondary | ICD-10-CM | POA: Diagnosis not present

## 2021-06-23 DIAGNOSIS — M545 Low back pain, unspecified: Secondary | ICD-10-CM

## 2021-07-20 ENCOUNTER — Telehealth: Payer: Self-pay | Admitting: Family Medicine

## 2021-07-20 NOTE — Telephone Encounter (Signed)
Pt has not heard from [facility] and thought the referral had been canceled. Please call back with status of referral.

## 2021-07-26 ENCOUNTER — Other Ambulatory Visit: Payer: Self-pay

## 2021-07-26 ENCOUNTER — Encounter: Payer: Self-pay | Admitting: Family Medicine

## 2021-07-26 ENCOUNTER — Ambulatory Visit (INDEPENDENT_AMBULATORY_CARE_PROVIDER_SITE_OTHER): Payer: Medicare HMO | Admitting: Family Medicine

## 2021-07-26 VITALS — BP 126/64 | HR 79 | Temp 97.8°F | Ht 68.0 in | Wt 123.4 lb

## 2021-07-26 DIAGNOSIS — M48062 Spinal stenosis, lumbar region with neurogenic claudication: Secondary | ICD-10-CM

## 2021-07-26 DIAGNOSIS — E039 Hypothyroidism, unspecified: Secondary | ICD-10-CM

## 2021-07-26 DIAGNOSIS — R430 Anosmia: Secondary | ICD-10-CM | POA: Diagnosis not present

## 2021-07-26 DIAGNOSIS — R300 Dysuria: Secondary | ICD-10-CM

## 2021-07-26 DIAGNOSIS — D500 Iron deficiency anemia secondary to blood loss (chronic): Secondary | ICD-10-CM

## 2021-07-26 DIAGNOSIS — E782 Mixed hyperlipidemia: Secondary | ICD-10-CM | POA: Diagnosis not present

## 2021-07-26 DIAGNOSIS — M545 Low back pain, unspecified: Secondary | ICD-10-CM | POA: Diagnosis not present

## 2021-07-26 DIAGNOSIS — I10 Essential (primary) hypertension: Secondary | ICD-10-CM

## 2021-07-26 LAB — URINALYSIS
Bilirubin, UA: NEGATIVE
Glucose, UA: NEGATIVE
Ketones, UA: NEGATIVE
Nitrite, UA: NEGATIVE
Protein,UA: NEGATIVE
Specific Gravity, UA: 1.01 (ref 1.005–1.030)
Urobilinogen, Ur: 0.2 mg/dL (ref 0.2–1.0)
pH, UA: 5 (ref 5.0–7.5)

## 2021-07-26 MED ORDER — SULFAMETHOXAZOLE-TRIMETHOPRIM 800-160 MG PO TABS: 1.0000 | ORAL_TABLET | Freq: Two times a day (BID) | ORAL | 0 refills | Status: DC

## 2021-07-26 MED ORDER — PREGABALIN 50 MG PO CAPS: ORAL_CAPSULE | ORAL | 0 refills | Status: DC

## 2021-07-26 MED ORDER — PREDNISONE 10 MG PO TABS: ORAL_TABLET | ORAL | 0 refills | Status: DC

## 2021-07-26 MED ORDER — FLUTICASONE PROPIONATE 50 MCG/ACT NA SUSP: 2.0000 | Freq: Every day | NASAL | 6 refills | Status: DC

## 2021-07-26 MED ORDER — FEXOFENADINE-PSEUDOEPHED ER 180-240 MG PO TB24: 1.0000 | ORAL_TABLET | Freq: Every evening | ORAL | 11 refills | Status: DC

## 2021-07-26 NOTE — Progress Notes (Signed)
Subjective:  Patient ID: [name], female    DOB: 1946/10/16  Age: 75 y.o. MRN: [id number]  CC: Medical Management of Chronic Issues   HPI JASHIRA COTUGNO presents for Everything tastes terrible. Started with conjunctivitis  in April.  Can't eat foods she loves such as tomato sandwich. .  She can discern sweet, sour, bitter, salty.  She denies any URI symptoms including congestion. Did not try the Lyrica through misunderstanding. Would like to try it due to chronic back pain.   burning with urination and frequency for several days. Denies fever . No flank pain. No nausea, vomiting.   Depression screen [facility] 2/9 07/26/2021 05/17/2021 04/25/2021  Decreased Interest 0 0 0  Down, Depressed, Hopeless 0 0 0  PHQ - 2 Score 0 0 0  Altered sleeping - 0 0  Tired, decreased energy - 1 1  Change in appetite - 0 2  Feeling bad or failure about yourself  - 0 0  Trouble concentrating - 0 0  Moving slowly or fidgety/restless - 0 0  Suicidal thoughts - 0 0  PHQ-9 Score - 1 3  Difficult doing work/chores - Somewhat difficult Somewhat difficult  Some recent data might be hidden    History Rabia has a past medical history of Acute pyelonephritis (10/15/2019), Acute renal failure superimposed on stage 2 chronic kidney disease ([place]) (10/15/2019), Alcoholism ([place]) (01/09/2012), Anemia, Anxiety, Arthritis, B12 deficiency, Carotid artery narrowing (10/03/2012), Chronic kidney disease (CKD), stage II (mild), Depression, Headache(784.0), Hyperlipidemia, Hypertension, Hyponatremia (01/09/2012), Hypothyroid, Menopause, Mini stroke ([place]), Orthostatic syncope (10/02/2012), Pneumonia, SBO (small bowel obstruction) ([facility]) (06/28/2016), Shortness of breath, Stroke ([place]) (2010), and Thrombocytopenia ([place]) (10/15/2019).   She has a past surgical history that includes Appendectomy; Tonsillectomy; Cataract extraction w/PHACO (06/20/2012); Cataract extraction w/PHACO (07/11/2012); Colonoscopy (N/A, 12/17/2015);  Esophagogastroduodenoscopy (N/A, 12/17/2015); Agile capsule (N/A, 02/22/2016); Knee arthroscopy (Right, 09/2016); and Lumbar laminectomy/decompression microdiscectomy (N/A, 05/25/2017).   Her family history includes GER disease in her mother; Hypertension in her maternal grandmother; Ulcers in her mother.She reports that she quit smoking about 29 years ago. Her smoking use included cigarettes. She has a 60.00 pack-year smoking history. She has never used smokeless tobacco. She reports that she does not drink alcohol and does not use drugs.    ROS Review of Systems  Constitutional: Negative.  Negative for chills, diaphoresis and fever.  HENT: Negative.  Negative for congestion.   Eyes:  Negative for visual disturbance.  Respiratory:  Negative for cough and shortness of breath.   Cardiovascular:  Negative for chest pain and palpitations.  Gastrointestinal:  Negative for abdominal pain, constipation, diarrhea and nausea.  Genitourinary:  Positive for dysuria, frequency and urgency. Negative for decreased urine volume, flank pain, hematuria, menstrual problem and pelvic pain.  Musculoskeletal:  Negative for arthralgias and joint swelling.  Skin:  Negative for rash.  Neurological:  Negative for dizziness and numbness.   Objective:  BP 126/64   Pulse 79   Temp 97.8 F (36.6 C)   Ht 5' 8"  (1.727 m)   Wt 123 lb 6.4 oz (56 kg)   LMP 08/21/2013   SpO2 100%   BMI 18.76 kg/m   BP Readings from Last 3 Encounters:  07/26/21 126/64  06/20/21 (!) 83/50  05/17/21 (!) 154/69    Wt Readings from Last 3 Encounters:  07/26/21 123 lb 6.4 oz (56 kg)  06/20/21 130 lb (59 kg)  05/17/21 134 lb (60.8 kg)     Physical Exam Constitutional:  General: She is not in acute distress.    Appearance: She is well-developed.  HENT:     Head: Normocephalic and atraumatic.     Nose: Congestion present.  Eyes:     Conjunctiva/sclera: Conjunctivae normal.     Pupils: Pupils are equal, round, and reactive  to light.  Cardiovascular:     Rate and Rhythm: Normal rate and regular rhythm.     Heart sounds: Normal heart sounds. No murmur heard. Pulmonary:     Effort: Pulmonary effort is normal. No respiratory distress.     Breath sounds: Normal breath sounds. No wheezing or rales.  Abdominal:     General: Bowel sounds are normal. There is no distension.     Palpations: Abdomen is soft. There is no mass.     Tenderness: There is no abdominal tenderness. There is no guarding or rebound.  Musculoskeletal:        General: No tenderness.     Cervical back: Normal range of motion and neck supple.     Lumbar back: Spasms present. No deformity. Decreased range of motion.  Skin:    General: Skin is warm and dry.  Neurological:     Mental Status: She is alert and oriented to person, place, and time.     Deep Tendon Reflexes: Reflexes are normal and symmetric.  Psychiatric:        Behavior: Behavior normal.        Thought Content: Thought content normal.      Assessment & Plan:   See was seen today for medical management of chronic issues.  Diagnoses and all orders for this visit:  Essential hypertension -     CMP14+EGFR  Mixed hyperlipidemia -     Lipid panel  Iron deficiency anemia due to chronic blood loss -     CBC with Differential/Platelet  Hypothyroidism, unspecified type -     TSH + free T4  Lumbar pain -     pregabalin (LYRICA) 50 MG capsule; 1 qhs X7 days , then 2 qhs X 7d, then 3 qhs X 7d, then 4 qhs  Spinal stenosis, lumbar region with neurogenic claudication -     pregabalin (LYRICA) 50 MG capsule; 1 qhs X7 days , then 2 qhs X 7d, then 3 qhs X 7d, then 4 qhs  Anosmia -     fexofenadine-pseudoephedrine (ALLEGRA-D 24) 180-240 MG 24 hr tablet; Take 1 tablet by mouth every evening. For allergy and congestion -     predniSONE (DELTASONE) 10 MG tablet; Take 5 daily for 3 days followed by 4,3,2 and 1 for 3 days each. -     fluticasone (FLONASE) 50 MCG/ACT nasal spray;  Place 2 sprays into both nostrils daily.  Dysuria -     Urinalysis -     Urine Culture  Other orders -     sulfamethoxazole-trimethoprim (BACTRIM DS) 800-160 MG tablet; Take 1 tablet by mouth 2 (two) times daily.      I am having [name]. Mccabe start on fexofenadine-pseudoephedrine, predniSONE, and sulfamethoxazole-trimethoprim. I am also having her maintain her aspirin EC, thiamine, folic acid, Vitamin D (Ergocalciferol), alendronate, levothyroxine, olmesartan, metoprolol succinate, rosuvastatin, pregabalin, and fluticasone.  Allergies as of 07/26/2021       Reactions   Ciprofloxacin Other (See Comments)   Caused pt to have delusional thoughts and hallucinations   Procardia [nifedipine] Swelling   Edema    Norvasc [amlodipine Besylate] Hives        Medication List  Accurate as of July 26, 2021  5:50 PM. If you have any questions, ask your nurse or doctor.          alendronate 70 MG tablet Commonly known as: FOSAMAX Take 1 tablet (70 mg total) by mouth once a week. Take with a full glass of water on an empty stomach.   aspirin EC 81 MG tablet Take 81 mg by mouth daily.   fexofenadine-pseudoephedrine 180-240 MG 24 hr tablet Commonly known as: ALLEGRA-D 24 Take 1 tablet by mouth every evening. For allergy and congestion Started by: Claretta Fraise, MD   fluticasone 50 MCG/ACT nasal spray Commonly known as: FLONASE Place 2 sprays into both nostrils daily.   folic acid 979 MCG tablet Commonly known as: FOLVITE Take 2,000 mcg by mouth daily.   levothyroxine 100 MCG tablet Commonly known as: SYNTHROID Take 1 tablet (100 mcg total) by mouth daily.   metoprolol succinate 100 MG 24 hr tablet Commonly known as: TOPROL-XL Take 1 tablet (100 mg total) by mouth daily. For blood pressure control   olmesartan 20 MG tablet Commonly known as: BENICAR Take 1 tablet (20 mg total) by mouth daily.   predniSONE 10 MG tablet Commonly known as: DELTASONE Take 5  daily for 3 days followed by 4,3,2 and 1 for 3 days each. Started by: Claretta Fraise, MD   pregabalin 50 MG capsule Commonly known as: Lyrica 1 qhs X7 days , then 2 qhs X 7d, then 3 qhs X 7d, then 4 qhs   rosuvastatin 10 MG tablet Commonly known as: CRESTOR TAKE 1 TABLET EVERY DAY   sulfamethoxazole-trimethoprim 800-160 MG tablet Commonly known as: BACTRIM DS Take 1 tablet by mouth 2 (two) times daily. Started by: Claretta Fraise, MD   thiamine 100 MG tablet Commonly known as: Vitamin B-1 Take 1 tablet (100 mg total) by mouth daily.   Vitamin D (Ergocalciferol) 1.25 MG (50000 UNIT) Caps capsule Commonly known as: DRISDOL Take 1 capsule (50,000 Units total) by mouth every 7 (seven) days.         Follow-up: Return in about 3 months (around 10/26/2021), or if symptoms worsen or fail to improve.  Claretta Fraise, M.D.

## 2021-07-27 LAB — LIPID PANEL
Chol/HDL Ratio: 1.9 ratio (ref 0.0–4.4)
Cholesterol, Total: 136 mg/dL (ref 100–199)
HDL: 70 mg/dL (ref >39)
LDL Chol Calc (NIH): 47 mg/dL (ref 0–99)
Triglycerides: 105 mg/dL (ref 0–149)
VLDL Cholesterol Cal: 19 mg/dL (ref 5–40)

## 2021-07-27 LAB — CMP14+EGFR
ALT: 8 IU/L (ref 0–32)
AST: 13 IU/L (ref 0–40)
Albumin/Globulin Ratio: 2 (ref 1.2–2.2)
Albumin: 4.5 g/dL (ref 3.7–4.7)
Alkaline Phosphatase: 85 IU/L (ref 44–121)
BUN/Creatinine Ratio: 7 — ABNORMAL LOW (ref 12–28)
BUN: 10 mg/dL (ref 8–27)
Bilirubin Total: 0.4 mg/dL (ref 0.0–1.2)
CO2: 17 mmol/L — ABNORMAL LOW (ref 20–29)
Calcium: 10.1 mg/dL (ref 8.7–10.3)
Chloride: 99 mmol/L (ref 96–106)
Creatinine, Ser: 1.35 mg/dL — ABNORMAL HIGH (ref 0.57–1.00)
Globulin, Total: 2.3 g/dL (ref 1.5–4.5)
Glucose: 83 mg/dL (ref 65–99)
Potassium: 4.6 mmol/L (ref 3.5–5.2)
Sodium: 134 mmol/L (ref 134–144)
Total Protein: 6.8 g/dL (ref 6.0–8.5)
eGFR: 41 mL/min/{1.73_m2} — ABNORMAL LOW (ref >59)

## 2021-07-27 LAB — CBC WITH DIFFERENTIAL/PLATELET
Basophils Absolute: 0.1 10*3/uL (ref 0.0–0.2)
Basos: 1 %
EOS (ABSOLUTE): 0.3 10*3/uL (ref 0.0–0.4)
Eos: 3 %
Hematocrit: 36.5 % (ref 34.0–46.6)
Hemoglobin: 11.8 g/dL (ref 11.1–15.9)
Immature Grans (Abs): 0.1 10*3/uL (ref 0.0–0.1)
Immature Granulocytes: 1 %
Lymphocytes Absolute: 2.1 10*3/uL (ref 0.7–3.1)
Lymphs: 22 %
MCH: 30.8 pg (ref 26.6–33.0)
MCHC: 32.3 g/dL (ref 31.5–35.7)
MCV: 95 fL (ref 79–97)
Monocytes Absolute: 0.7 10*3/uL (ref 0.1–0.9)
Monocytes: 7 %
Neutrophils Absolute: 6.4 10*3/uL (ref 1.4–7.0)
Neutrophils: 66 %
Platelets: 320 10*3/uL (ref 150–450)
RBC: 3.83 x10E6/uL (ref 3.77–5.28)
RDW: 14.1 % (ref 11.7–15.4)
WBC: 9.5 10*3/uL (ref 3.4–10.8)

## 2021-07-27 LAB — TSH+FREE T4
Free T4: 1.29 ng/dL (ref 0.82–1.77)
TSH: 0.425 u[IU]/mL — ABNORMAL LOW (ref 0.450–4.500)

## 2021-07-28 LAB — URINE CULTURE

## 2021-07-29 ENCOUNTER — Other Ambulatory Visit: Payer: Self-pay | Admitting: Family Medicine

## 2021-07-29 MED ORDER — FLUCONAZOLE 100 MG PO TABS: ORAL_TABLET | ORAL | 0 refills | Status: DC

## 2021-08-01 ENCOUNTER — Other Ambulatory Visit: Payer: Self-pay | Admitting: Family Medicine

## 2021-08-04 ENCOUNTER — Telehealth: Payer: Self-pay | Admitting: Family Medicine

## 2021-08-04 NOTE — Telephone Encounter (Signed)
Patient aware of what medications she should be on.

## 2021-08-18 DIAGNOSIS — M25562 Pain in left knee: Secondary | ICD-10-CM | POA: Diagnosis not present

## 2021-08-22 ENCOUNTER — Telehealth: Payer: Self-pay | Admitting: Family Medicine

## 2021-08-23 ENCOUNTER — Other Ambulatory Visit: Payer: Self-pay | Admitting: *Deleted

## 2021-08-23 ENCOUNTER — Other Ambulatory Visit: Payer: Self-pay

## 2021-08-23 ENCOUNTER — Other Ambulatory Visit: Payer: Medicare HMO

## 2021-08-23 DIAGNOSIS — R300 Dysuria: Secondary | ICD-10-CM

## 2021-08-23 LAB — URINALYSIS, COMPLETE
Bilirubin, UA: NEGATIVE
Glucose, UA: NEGATIVE
Ketones, UA: NEGATIVE
Leukocytes,UA: NEGATIVE
Nitrite, UA: NEGATIVE
RBC, UA: NEGATIVE
Specific Gravity, UA: 1.02 (ref 1.005–1.030)
Urobilinogen, Ur: 0.2 mg/dL (ref 0.2–1.0)
pH, UA: 7.5 (ref 5.0–7.5)

## 2021-08-23 LAB — MICROSCOPIC EXAMINATION
RBC, Urine: NONE SEEN /hpf (ref 0–2)
Renal Epithel, UA: NONE SEEN /hpf

## 2021-08-23 NOTE — Telephone Encounter (Signed)
Tests ordered, patient aware

## 2021-08-23 NOTE — Telephone Encounter (Signed)
Have her come in to do a specimen for UA & cultureonly since she was recently seen

## 2021-08-26 NOTE — Telephone Encounter (Signed)
Please call patient with lab results ASAP.

## 2021-08-27 LAB — URINE CULTURE

## 2021-08-29 NOTE — Telephone Encounter (Signed)
PATIENT AWARE OF RESULTS

## 2021-09-08 ENCOUNTER — Ambulatory Visit: Payer: Medicare HMO | Admitting: Family Medicine

## 2021-09-21 ENCOUNTER — Encounter: Payer: Self-pay | Admitting: Family Medicine

## 2021-09-21 ENCOUNTER — Ambulatory Visit (INDEPENDENT_AMBULATORY_CARE_PROVIDER_SITE_OTHER): Payer: Medicare HMO | Admitting: Family Medicine

## 2021-09-21 ENCOUNTER — Other Ambulatory Visit: Payer: Self-pay

## 2021-09-21 ENCOUNTER — Other Ambulatory Visit: Payer: Self-pay | Admitting: Family Medicine

## 2021-09-21 VITALS — BP 166/78 | HR 95 | Temp 97.7°F | Ht 68.0 in | Wt 125.2 lb

## 2021-09-21 DIAGNOSIS — E039 Hypothyroidism, unspecified: Secondary | ICD-10-CM

## 2021-09-21 DIAGNOSIS — E782 Mixed hyperlipidemia: Secondary | ICD-10-CM

## 2021-09-21 DIAGNOSIS — M545 Low back pain, unspecified: Secondary | ICD-10-CM | POA: Diagnosis not present

## 2021-09-21 DIAGNOSIS — I10 Essential (primary) hypertension: Secondary | ICD-10-CM

## 2021-09-21 DIAGNOSIS — M48062 Spinal stenosis, lumbar region with neurogenic claudication: Secondary | ICD-10-CM | POA: Diagnosis not present

## 2021-09-21 MED ORDER — METOPROLOL SUCCINATE ER 100 MG PO TB24: 100.0000 mg | ORAL_TABLET | Freq: Every day | ORAL | 3 refills | Status: DC

## 2021-09-21 MED ORDER — PREGABALIN 200 MG PO CAPS: 200.0000 mg | ORAL_CAPSULE | Freq: Every day | ORAL | 1 refills | Status: DC

## 2021-09-21 MED ORDER — LEVOTHYROXINE SODIUM 100 MCG PO TABS: 100.0000 ug | ORAL_TABLET | Freq: Every day | ORAL | 1 refills | Status: DC

## 2021-09-21 NOTE — Progress Notes (Signed)
Subjective:  Patient ID: [name], female    DOB: Dec 11, 1946  Age: 75 y.o. MRN: [id number]  CC: Medical Management of Chronic Issues   HPI NABA SNEED presents for preop clearance for left knee replacement surgery.   Not sure if the lyrica helped with pain, but surgery is coming up soon.  Depression screen [facility] 2/9 09/21/2021 07/26/2021 05/17/2021  Decreased Interest 0 0 0  Down, Depressed, Hopeless 0 0 0  PHQ - 2 Score 0 0 0  Altered sleeping - - 0  Tired, decreased energy - - 1  Change in appetite - - 0  Feeling bad or failure about yourself  - - 0  Trouble concentrating - - 0  Moving slowly or fidgety/restless - - 0  Suicidal thoughts - - 0  PHQ-9 Score - - 1  Difficult doing work/chores - - Somewhat difficult  Some recent data might be hidden    History Zaylia has a past medical history of Acute pyelonephritis (10/15/2019), Acute renal failure superimposed on stage 2 chronic kidney disease ([place]) (10/15/2019), Alcoholism ([place]) (01/09/2012), Anemia, Anxiety, Arthritis, B12 deficiency, Carotid artery narrowing (10/03/2012), Chronic kidney disease (CKD), stage II (mild), Depression, Headache(784.0), Hyperlipidemia, Hypertension, Hyponatremia (01/09/2012), Hypothyroid, Menopause, Mini stroke, Orthostatic syncope (10/02/2012), Pneumonia, SBO (small bowel obstruction) ([facility]) (06/28/2016), Shortness of breath, Stroke ([place]) (2010), and Thrombocytopenia ([place]) (10/15/2019).   She has a past surgical history that includes Appendectomy; Tonsillectomy; Cataract extraction w/PHACO (06/20/2012); Cataract extraction w/PHACO (07/11/2012); Colonoscopy (N/A, 12/17/2015); Esophagogastroduodenoscopy (N/A, 12/17/2015); Agile capsule (N/A, 02/22/2016); Knee arthroscopy (Right, 09/2016); and Lumbar laminectomy/decompression microdiscectomy (N/A, 05/25/2017).   Her family history includes GER disease in her mother; Hypertension in her maternal grandmother; Ulcers in her mother.She reports that she quit smoking  about 29 years ago. Her smoking use included cigarettes. She has a 60.00 pack-year smoking history. She has never used smokeless tobacco. She reports that she does not drink alcohol and does not use drugs.    ROS Review of Systems  Constitutional: Negative.   HENT: Negative.    Eyes:  Negative for visual disturbance.  Respiratory:  Negative for shortness of breath.   Cardiovascular:  Negative for chest pain.  Gastrointestinal:  Negative for abdominal pain.  Musculoskeletal:  Negative for arthralgias.   Objective:  BP (!) 166/78   Pulse 95   Temp 97.7 F (36.5 C)   Ht 5' 8"  (1.727 m)   Wt 125 lb 3.2 oz (56.8 kg)   LMP 08/21/2013   SpO2 100%   BMI 19.04 kg/m   BP Readings from Last 3 Encounters:  09/21/21 (!) 166/78  07/26/21 126/64  06/20/21 (!) 83/50    Wt Readings from Last 3 Encounters:  09/21/21 125 lb 3.2 oz (56.8 kg)  07/26/21 123 lb 6.4 oz (56 kg)  06/20/21 130 lb (59 kg)     Physical Exam Constitutional:      General: She is not in acute distress.    Appearance: She is well-developed.  Cardiovascular:     Rate and Rhythm: Normal rate and regular rhythm.  Pulmonary:     Breath sounds: Normal breath sounds.  Musculoskeletal:        General: Normal range of motion.  Skin:    General: Skin is warm and dry.  Neurological:     Mental Status: She is alert and oriented to person, place, and time.      Assessment & Plan:   Carter was seen today for medical management of chronic issues.  Diagnoses  and all orders for this visit:  Essential hypertension -     CBC with Differential/Platelet -     CMP14+EGFR  Mixed hyperlipidemia -     Lipid panel  Hypothyroidism, unspecified type -     Cancel: TSH + free T4  Accelerated hypertension -     metoprolol succinate (TOPROL-XL) 100 MG 24 hr tablet; Take 1 tablet (100 mg total) by mouth daily. For blood pressure control  Lumbar pain -     pregabalin (LYRICA) 200 MG capsule; Take 1 capsule (200 mg total)  by mouth daily.  Spinal stenosis, lumbar region with neurogenic claudication -     pregabalin (LYRICA) 200 MG capsule; Take 1 capsule (200 mg total) by mouth daily.  Other orders -     levothyroxine (SYNTHROID) 100 MCG tablet; Take 1 tablet (100 mcg total) by mouth daily.      I have discontinued [name]. Sailors's predniSONE and sulfamethoxazole-trimethoprim. I have also changed her pregabalin. Additionally, I am having her maintain her aspirin EC, thiamine, folic acid, Vitamin D (Ergocalciferol), alendronate, rosuvastatin, fexofenadine-pseudoephedrine, fluticasone, fluconazole, olmesartan, levothyroxine, and metoprolol succinate.  Allergies as of 09/21/2021       Reactions   Ciprofloxacin Other (See Comments)   Caused pt to have delusional thoughts and hallucinations   Procardia [nifedipine] Swelling   Edema    Norvasc [amlodipine Besylate] Hives        Medication List        Accurate as of September 21, 2021  3:53 PM. If you have any questions, ask your nurse or doctor.          STOP taking these medications    predniSONE 10 MG tablet Commonly known as: DELTASONE Stopped by: Claretta Fraise, MD   sulfamethoxazole-trimethoprim 800-160 MG tablet Commonly known as: BACTRIM DS Stopped by: Claretta Fraise, MD       TAKE these medications    alendronate 70 MG tablet Commonly known as: FOSAMAX Take 1 tablet (70 mg total) by mouth once a week. Take with a full glass of water on an empty stomach.   aspirin EC 81 MG tablet Take 81 mg by mouth daily.   fexofenadine-pseudoephedrine 180-240 MG 24 hr tablet Commonly known as: ALLEGRA-D 24 Take 1 tablet by mouth every evening. For allergy and congestion   fluconazole 100 MG tablet Commonly known as: Diflucan Take two with first dose. Then starting the next day take one daily until all are taken.   fluticasone 50 MCG/ACT nasal spray Commonly known as: FLONASE Place 2 sprays into both nostrils daily.   folic acid  383 MCG tablet Commonly known as: FOLVITE Take 2,000 mcg by mouth daily.   levothyroxine 100 MCG tablet Commonly known as: SYNTHROID Take 1 tablet (100 mcg total) by mouth daily.   metoprolol succinate 100 MG 24 hr tablet Commonly known as: TOPROL-XL Take 1 tablet (100 mg total) by mouth daily. For blood pressure control   olmesartan 20 MG tablet Commonly known as: BENICAR Take 1 tablet by mouth once daily   pregabalin 200 MG capsule Commonly known as: Lyrica Take 1 capsule (200 mg total) by mouth daily. What changed:  medication strength how much to take how to take this when to take this additional instructions Changed by: Claretta Fraise, MD   rosuvastatin 10 MG tablet Commonly known as: CRESTOR TAKE 1 TABLET EVERY DAY   thiamine 100 MG tablet Commonly known as: Vitamin B-1 Take 1 tablet (100 mg total) by mouth daily.  Vitamin D (Ergocalciferol) 1.25 MG (50000 UNIT) Caps capsule Commonly known as: DRISDOL Take 1 capsule (50,000 Units total) by mouth every 7 (seven) days.         Follow-up: Return in about 6 months (around 03/22/2022).  Claretta Fraise, M.D.

## 2021-09-22 LAB — LIPID PANEL
Chol/HDL Ratio: 2.1 ratio (ref 0.0–4.4)
Cholesterol, Total: 145 mg/dL (ref 100–199)
HDL: 69 mg/dL (ref >39)
LDL Chol Calc (NIH): 26 mg/dL (ref 0–99)
Triglycerides: 354 mg/dL — ABNORMAL HIGH (ref 0–149)
VLDL Cholesterol Cal: 50 mg/dL — ABNORMAL HIGH (ref 5–40)

## 2021-09-22 LAB — CMP14+EGFR
ALT: 37 IU/L — ABNORMAL HIGH (ref 0–32)
AST: 64 IU/L — ABNORMAL HIGH (ref 0–40)
Albumin/Globulin Ratio: 1.7 (ref 1.2–2.2)
Albumin: 4 g/dL (ref 3.7–4.7)
Alkaline Phosphatase: 160 IU/L — ABNORMAL HIGH (ref 44–121)
BUN/Creatinine Ratio: 7 — ABNORMAL LOW (ref 12–28)
BUN: 9 mg/dL (ref 8–27)
Bilirubin Total: 0.7 mg/dL (ref 0.0–1.2)
CO2: 24 mmol/L (ref 20–29)
Calcium: 9 mg/dL (ref 8.7–10.3)
Chloride: 100 mmol/L (ref 96–106)
Creatinine, Ser: 1.34 mg/dL — ABNORMAL HIGH (ref 0.57–1.00)
Globulin, Total: 2.4 g/dL (ref 1.5–4.5)
Glucose: 94 mg/dL (ref 70–99)
Potassium: 4.5 mmol/L (ref 3.5–5.2)
Sodium: 139 mmol/L (ref 134–144)
Total Protein: 6.4 g/dL (ref 6.0–8.5)
eGFR: 42 mL/min/{1.73_m2} — ABNORMAL LOW (ref >59)

## 2021-09-22 LAB — CBC WITH DIFFERENTIAL/PLATELET
Basophils Absolute: 0 10*3/uL (ref 0.0–0.2)
Basos: 0 %
EOS (ABSOLUTE): 0 10*3/uL (ref 0.0–0.4)
Eos: 0 %
Hematocrit: 38.2 % (ref 34.0–46.6)
Hemoglobin: 13.1 g/dL (ref 11.1–15.9)
Immature Grans (Abs): 0 10*3/uL (ref 0.0–0.1)
Immature Granulocytes: 0 %
Lymphocytes Absolute: 2.7 10*3/uL (ref 0.7–3.1)
Lymphs: 28 %
MCH: 32.9 pg (ref 26.6–33.0)
MCHC: 34.3 g/dL (ref 31.5–35.7)
MCV: 96 fL (ref 79–97)
Monocytes Absolute: 0.9 10*3/uL (ref 0.1–0.9)
Monocytes: 10 %
Neutrophils Absolute: 5.8 10*3/uL (ref 1.4–7.0)
Neutrophils: 62 %
Platelets: 203 10*3/uL (ref 150–450)
RBC: 3.98 x10E6/uL (ref 3.77–5.28)
RDW: 12.2 % (ref 11.7–15.4)
WBC: 9.5 10*3/uL (ref 3.4–10.8)

## 2021-09-22 NOTE — Progress Notes (Signed)
Pt r/c about labs. Please call back

## 2021-09-23 ENCOUNTER — Other Ambulatory Visit: Payer: Self-pay | Admitting: *Deleted

## 2021-09-23 DIAGNOSIS — R748 Abnormal levels of other serum enzymes: Secondary | ICD-10-CM

## 2021-10-06 ENCOUNTER — Telehealth: Payer: Self-pay | Admitting: Family Medicine

## 2021-10-06 NOTE — Telephone Encounter (Signed)
REFERRAL REQUEST Telephone Note  Have you been seen at our office for this problem? yes (Advise that they may need an appointment with their PCP before a referral can be done)  Reason for Referral: aptt that was suppose to already be made Referral discussed with patient: yes  Best contact number of patient for referral team: [phone]    Has patient been seen by a specialist for this issue before: not sure  Patient provider preference for referral: [facility] Patient location preference for referral: [facility]   Patient notified that referrals can take up to a week or longer to process. If they haven't heard anything within a week they should call back and speak with the referral department.    Stacks' pt.  She called [facility] & apparently they don't have her scheduled.  Please call pt.  She thought she had an appt already!  She said no one ever calls her back!

## 2021-10-08 NOTE — Telephone Encounter (Signed)
Please cancel referral

## 2021-10-10 NOTE — Progress Notes (Signed)
Please enter orders for PAT visit scheduled for 10-18-21

## 2021-10-12 DIAGNOSIS — M25562 Pain in left knee: Secondary | ICD-10-CM | POA: Diagnosis not present

## 2021-10-13 ENCOUNTER — Telehealth: Payer: Self-pay | Admitting: *Deleted

## 2021-10-13 ENCOUNTER — Other Ambulatory Visit: Payer: Self-pay | Admitting: Family Medicine

## 2021-10-13 MED ORDER — VITAMIN D (ERGOCALCIFEROL) 1.25 MG (50000 UNIT) PO CAPS: 50000.0000 [IU] | ORAL_CAPSULE | ORAL | 3 refills | Status: DC

## 2021-10-13 NOTE — Telephone Encounter (Signed)
Fax from [place] [facility]: Vit D 1.25 mg cap, cannot fill due to Rx expiring on 11/22/21 before next available fill date Rx was written 11/22/20 #13 with 3 refills last Vit D level was 57 on 01/26/21 Last OV 09/21/21 Next OV 03/22/22 Please advise

## 2021-10-13 NOTE — Telephone Encounter (Signed)
Please let the patient know that I sent their prescription to their pharmacy. Thanks, WS 

## 2021-10-17 ENCOUNTER — Other Ambulatory Visit: Payer: Self-pay | Admitting: Family Medicine

## 2021-10-17 NOTE — Patient Instructions (Signed)
DUE TO COVID-19 ONLY ONE VISITOR IS ALLOWED TO COME WITH YOU AND STAY IN THE WAITING ROOM ONLY DURING PRE OP AND PROCEDURE DAY OF SURGERY IF YOU ARE GOING HOME AFTER SURGERY. IF YOU ARE SPENDING THE NIGHT 2 PEOPLE MAY VISIT WITH YOU IN YOUR PRIVATE ROOM AFTER SURGERY UNTIL VISITING  HOURS ARE OVER AT 8:00 PM AND 1 VISITOR CAN SPEND THE NIGHT.   YOU NEED TO HAVE A COVID 19 TEST ON_11/17____THIS TEST MUST BE DONE BEFORE SURGERY,  COVID TESTING SITE  IS LOCATED AT [place], [place]. REMAIN IN YOUR CAR THIS IS A DRIVE UP TEST. AFTER YOUR COVID TEST PLEASE WEAR A MASK OUT IN PUBLIC AND SOCIAL DISTANCE AND [place] YOUR HANDS FREQUENTLY, ALSO ASK ALL YOUR CLOSE CONTACT PERSONS TO WEAR A MASK AND SOCIAL DISTANCE AND [place] THEIR HANDS FREQUENTLY ALSO.               [name]     Your procedure is scheduled on: 10/31/21   Report to [facility] Main  Entrance   Report to admitting at  8:50 AM     Call this number if you have problems the morning of surgery [phone]    No food after midnight.    You may have clear liquid until 8:30 AM.    At 8:00 AM drink pre surgery drink.   Nothing by mouth after 8:30 AM.     CLEAR LIQUID DIET   Foods Allowed                                                                     Foods Excluded  Coffee and tea, regular and decaf                             liquids that you cannot  Plain Jell-O any favor except red or purple                                           see through such as: Fruit ices (not with fruit pulp)                                     milk, soups, orange juice  Iced Popsicles                                    All solid food Carbonated beverages, regular and diet                                    Cranberry, grape and apple juices Sports drinks like Gatorade Lightly seasoned clear broth or consume(fat free) Sugar     BRUSH YOUR TEETH MORNING OF SURGERY AND RINSE YOUR MOUTH OUT, NO CHEWING GUM  CANDY OR MINTS.     Take these medicines the morning of surgery with A  SIP OF WATER: Lyrica, Metoprolol, Levothyroxine                                You may not have any metal on your body including hair pins and              piercings  Do not wear jewelry, make-up, lotions, powders or perfumes, deodorant             Do not wear nail polish on your fingernails.  Do not shave  48 hours prior to surgery.              Do not bring valuables to the hospital. [place].  Contacts, dentures or bridgework may not be worn into surgery.  Leave suitcase in the car. After surgery it may be brought to your room.                 [place] - Preparing for Surgery Before surgery, you can play an important role.  Because skin is not sterile, your skin needs to be as free of germs as possible.  You can reduce the number of germs on your skin by washing with CHG (chlorahexidine gluconate) soap before surgery.  CHG is an antiseptic cleaner which kills germs and bonds with the skin to continue killing germs even after washing. Please DO NOT use if you have an allergy to CHG or antibacterial soaps.  If your skin becomes reddened/irritated stop using the CHG and inform your nurse when you arrive at Short Stay. Do not shave (including legs and underarms) for at least 48 hours prior to the first CHG shower.  Please follow these instructions carefully:  1.  Shower with CHG Soap the night before surgery and the  morning of Surgery.  2.  If you choose to wash your hair, wash your hair first as usual with your  normal  shampoo.  3.  After you shampoo, rinse your hair and body thoroughly to remove the  shampoo.                            4.  Use CHG as you would any other liquid soap.  You can apply chg directly  to the skin and wash                       Gently with a scrungie or clean washcloth.  5.  Apply the CHG Soap to your body ONLY FROM THE NECK DOWN.   Do  not use on face/ open                           Wound or open sores. Avoid contact with eyes, ears mouth and genitals (private parts).                       Wash face,  Genitals (private parts) with your normal soap.             6.  Wash thoroughly, paying special attention to the area where your surgery  will be performed.  7.  Thoroughly rinse your body with warm water from the neck down.  8.  DO NOT shower/wash with your  normal soap after using and rinsing off  the CHG Soap.                9.  Pat yourself dry with a clean towel.            10.  Wear clean pajamas.            11.  Place clean sheets on your bed the night of your first shower and do not  sleep with pets. Day of Surgery : Do not apply any lotions/deodorants the morning of surgery.  Please wear clean clothes to the hospital/surgery center.  FAILURE TO FOLLOW THESE INSTRUCTIONS MAY RESULT IN THE CANCELLATION OF YOUR SURGERY PATIENT SIGNATURE_________________________________  NURSE SIGNATURE__________________________________  ________________________________________________________________________   [name]  An incentive spirometer is a tool that can help keep your lungs clear and active. This tool measures how well you are filling your lungs with each breath. Taking long deep breaths may help reverse or decrease the chance of developing breathing (pulmonary) problems (especially infection) following: A long period of time when you are unable to move or be active. BEFORE THE PROCEDURE  If the spirometer includes an indicator to show your best effort, your nurse or respiratory therapist will set it to a desired goal. If possible, sit up straight or lean slightly forward. Try not to slouch. Hold the incentive spirometer in an upright position. INSTRUCTIONS FOR USE  Sit on the edge of your bed if possible, or sit up as far as you can in bed or on a chair. Hold the incentive spirometer in an upright  position. Breathe out normally. Place the mouthpiece in your mouth and seal your lips tightly around it. Breathe in slowly and as deeply as possible, raising the piston or the ball toward the top of the column. Hold your breath for 3-5 seconds or for as long as possible. Allow the piston or ball to fall to the bottom of the column. Remove the mouthpiece from your mouth and breathe out normally. Rest for a few seconds and repeat Steps 1 through 7 at least 10 times every 1-2 hours when you are awake. Take your time and take a few normal breaths between deep breaths. The spirometer may include an indicator to show your best effort. Use the indicator as a goal to work toward during each repetition. After each set of 10 deep breaths, practice coughing to be sure your lungs are clear. If you have an incision (the cut made at the time of surgery), support your incision when coughing by placing a pillow or rolled up towels firmly against it. Once you are able to get out of bed, walk around indoors and cough well. You may stop using the incentive spirometer when instructed by your caregiver.  RISKS AND COMPLICATIONS Take your time so you do not get dizzy or light-headed. If you are in pain, you may need to take or ask for pain medication before doing incentive spirometry. It is harder to take a deep breath if you are having pain. AFTER USE Rest and breathe slowly and easily. It can be helpful to keep track of a log of your progress. Your caregiver can provide you with a simple table to help with this. If you are using the spirometer at home, follow these instructions: [place] IF:  You are having difficultly using the spirometer. You have trouble using the spirometer as often as instructed. Your pain medication is not giving enough relief while using  the spirometer. You develop fever of 100.5 F (38.1 C) or higher. SEEK IMMEDIATE MEDICAL CARE IF:  You cough up bloody sputum that had not been  present before. You develop fever of 102 F (38.9 C) or greater. You develop worsening pain at or near the incision site. MAKE SURE YOU:  Understand these instructions. Will watch your condition. Will get help right away if you are not doing well or get worse. Document Released: 04/09/2007 Document Revised: 02/19/2012 Document Reviewed: 06/10/2007 [facility] Patient Information 2014 [place], [place].   ________________________________________________________________________

## 2021-10-18 ENCOUNTER — Other Ambulatory Visit: Payer: Self-pay

## 2021-10-18 ENCOUNTER — Encounter (HOSPITAL_COMMUNITY): Payer: Self-pay

## 2021-10-18 ENCOUNTER — Encounter (HOSPITAL_COMMUNITY)
Admission: RE | Admit: 2021-10-18 | Discharge: 2021-10-18 | Disposition: A | Discharge status: Home / Self Care | Payer: Medicare HMO | Source: Ambulatory Visit | Attending: Orthopedic Surgery | Admitting: Orthopedic Surgery

## 2021-10-18 VITALS — Ht 64.0 in | Wt 135.0 lb

## 2021-10-18 DIAGNOSIS — I10 Essential (primary) hypertension: Secondary | ICD-10-CM | POA: Insufficient documentation

## 2021-10-18 DIAGNOSIS — Z01812 Encounter for preprocedural laboratory examination: Secondary | ICD-10-CM | POA: Diagnosis present

## 2021-10-18 DIAGNOSIS — Z01818 Encounter for other preprocedural examination: Secondary | ICD-10-CM

## 2021-10-18 NOTE — Progress Notes (Signed)
COVID Test-10/27/21    PCP - Dr. Viona Gilmore. [place] 09/21/21 Cardiologist - none  Chest x-ray - no EKG - will get it at the lab visit 10/27/21 Stress Test - no ECHO - 2014-epic Cardiac Cath - no Pacemaker/ICD device last checked:NA  Sleep Study - no CPAP -   Fasting Blood Sugar - NA Checks Blood Sugar _____ times a day  Blood Thinner Instructions:NA Aspirin Instructions: Last Dose:  Anesthesia review: yes  Patient denies shortness of breath, fever, cough and chest pain at PAT appointment Pt denies SOB with any activities but she doesn't climb stairs.She denies Thrombocytopenia and CKD. She forgot about her PAT visit  on 10/18/21. Lab only visit will be on 10/27/21 before her Covid test.  Patient verbalized understanding of instructions that were given to them at the PAT appointment. Patient was also instructed that they will need to review over the PAT instructions again at home before surgery. yes

## 2021-10-19 ENCOUNTER — Telehealth: Payer: Self-pay | Admitting: Family Medicine

## 2021-10-19 NOTE — Telephone Encounter (Signed)
LMTCB

## 2021-10-20 ENCOUNTER — Ambulatory Visit: Payer: Medicare HMO | Admitting: Family Medicine

## 2021-10-27 ENCOUNTER — Other Ambulatory Visit: Payer: Self-pay | Admitting: Orthopedic Surgery

## 2021-10-27 ENCOUNTER — Encounter (HOSPITAL_COMMUNITY)
Admission: RE | Admit: 2021-10-27 | Discharge: 2021-10-27 | Disposition: A | Discharge status: Home / Self Care | Payer: Medicare HMO | Source: Ambulatory Visit | Attending: Orthopedic Surgery | Admitting: Orthopedic Surgery

## 2021-10-27 DIAGNOSIS — Z01818 Encounter for other preprocedural examination: Secondary | ICD-10-CM | POA: Insufficient documentation

## 2021-10-27 DIAGNOSIS — M1712 Unilateral primary osteoarthritis, left knee: Secondary | ICD-10-CM | POA: Diagnosis not present

## 2021-10-27 LAB — CBC
HCT: 38.7 % (ref 36.0–46.0)
Hemoglobin: 12.9 g/dL (ref 12.0–15.0)
MCH: 32.9 pg (ref 26.0–34.0)
MCHC: 33.3 g/dL (ref 30.0–36.0)
MCV: 98.7 fL (ref 80.0–100.0)
Platelets: 221 10*3/uL (ref 150–400)
RBC: 3.92 MIL/uL (ref 3.87–5.11)
RDW: 12 % (ref 11.5–15.5)
WBC: 9.5 10*3/uL (ref 4.0–10.5)
nRBC: 0 % (ref 0.0–0.2)

## 2021-10-27 LAB — COMPREHENSIVE METABOLIC PANEL
ALT: 13 U/L (ref 0–44)
AST: 25 U/L (ref 15–41)
Albumin: 4 g/dL (ref 3.5–5.0)
Alkaline Phosphatase: 83 U/L (ref 38–126)
Anion gap: 11 (ref 5–15)
BUN: 15 mg/dL (ref 8–23)
CO2: 21 mmol/L — ABNORMAL LOW (ref 22–32)
Calcium: 9.3 mg/dL (ref 8.9–10.3)
Chloride: 101 mmol/L (ref 98–111)
Creatinine, Ser: 1.7 mg/dL — ABNORMAL HIGH (ref 0.44–1.00)
GFR, Estimated: 31 mL/min — ABNORMAL LOW (ref >60)
Glucose, Bld: 80 mg/dL (ref 70–99)
Potassium: 5.3 mmol/L — ABNORMAL HIGH (ref 3.5–5.1)
Sodium: 133 mmol/L — ABNORMAL LOW (ref 135–145)
Total Bilirubin: 0.9 mg/dL (ref 0.3–1.2)
Total Protein: 6.7 g/dL (ref 6.5–8.1)

## 2021-10-27 LAB — SURGICAL PCR SCREEN
MRSA, PCR: NEGATIVE
Staphylococcus aureus: NEGATIVE

## 2021-10-27 LAB — PROTIME-INR
INR: 1 (ref 0.8–1.2)
Prothrombin Time: 12.8 seconds (ref 11.4–15.2)

## 2021-10-27 LAB — SARS CORONAVIRUS 2 (TAT 6-24 HRS): SARS Coronavirus 2: NEGATIVE

## 2021-10-31 ENCOUNTER — Other Ambulatory Visit: Payer: Self-pay

## 2021-10-31 ENCOUNTER — Ambulatory Visit (HOSPITAL_COMMUNITY): Payer: Medicare HMO | Admitting: Certified Registered"

## 2021-10-31 ENCOUNTER — Encounter (HOSPITAL_COMMUNITY): Payer: Self-pay | Admitting: Orthopedic Surgery

## 2021-10-31 ENCOUNTER — Ambulatory Visit (HOSPITAL_COMMUNITY): Payer: Medicare HMO | Admitting: Physician Assistant

## 2021-10-31 ENCOUNTER — Observation Stay (HOSPITAL_COMMUNITY)
Admission: RE | Admit: 2021-10-31 | Discharge: 2021-11-02 | Disposition: A | Discharge status: Home / Self Care | Payer: Medicare HMO | Attending: Orthopedic Surgery | Admitting: Orthopedic Surgery

## 2021-10-31 ENCOUNTER — Encounter (HOSPITAL_COMMUNITY)
Admission: RE | Disposition: A | Discharge status: Home / Self Care | Payer: Self-pay | Source: Home / Self Care | Attending: Orthopedic Surgery

## 2021-10-31 DIAGNOSIS — M1712 Unilateral primary osteoarthritis, left knee: Principal | ICD-10-CM | POA: Insufficient documentation

## 2021-10-31 DIAGNOSIS — N182 Chronic kidney disease, stage 2 (mild): Secondary | ICD-10-CM | POA: Insufficient documentation

## 2021-10-31 DIAGNOSIS — M179 Osteoarthritis of knee, unspecified: Secondary | ICD-10-CM | POA: Diagnosis present

## 2021-10-31 DIAGNOSIS — I129 Hypertensive chronic kidney disease with stage 1 through stage 4 chronic kidney disease, or unspecified chronic kidney disease: Secondary | ICD-10-CM | POA: Diagnosis not present

## 2021-10-31 DIAGNOSIS — G8918 Other acute postprocedural pain: Secondary | ICD-10-CM | POA: Diagnosis not present

## 2021-10-31 DIAGNOSIS — Z87891 Personal history of nicotine dependence: Secondary | ICD-10-CM | POA: Diagnosis not present

## 2021-10-31 DIAGNOSIS — E039 Hypothyroidism, unspecified: Secondary | ICD-10-CM | POA: Insufficient documentation

## 2021-10-31 DIAGNOSIS — E871 Hypo-osmolality and hyponatremia: Secondary | ICD-10-CM | POA: Diagnosis not present

## 2021-10-31 DIAGNOSIS — Z7982 Long term (current) use of aspirin: Secondary | ICD-10-CM | POA: Diagnosis not present

## 2021-10-31 DIAGNOSIS — Z79899 Other long term (current) drug therapy: Secondary | ICD-10-CM | POA: Insufficient documentation

## 2021-10-31 DIAGNOSIS — I10 Essential (primary) hypertension: Secondary | ICD-10-CM

## 2021-10-31 DIAGNOSIS — E876 Hypokalemia: Secondary | ICD-10-CM | POA: Diagnosis not present

## 2021-10-31 HISTORY — PX: TOTAL KNEE ARTHROPLASTY: SHX125

## 2021-10-31 LAB — CBC
HCT: 33.7 % — ABNORMAL LOW (ref 36.0–46.0)
Hemoglobin: 11.4 g/dL — ABNORMAL LOW (ref 12.0–15.0)
MCH: 33.3 pg (ref 26.0–34.0)
MCHC: 33.8 g/dL (ref 30.0–36.0)
MCV: 98.5 fL (ref 80.0–100.0)
Platelets: 152 10*3/uL (ref 150–400)
RBC: 3.42 MIL/uL — ABNORMAL LOW (ref 3.87–5.11)
RDW: 11.9 % (ref 11.5–15.5)
WBC: 4.6 10*3/uL (ref 4.0–10.5)
nRBC: 0 % (ref 0.0–0.2)

## 2021-10-31 LAB — COMPREHENSIVE METABOLIC PANEL
ALT: 16 U/L (ref 0–44)
AST: 21 U/L (ref 15–41)
Albumin: 3.5 g/dL (ref 3.5–5.0)
Alkaline Phosphatase: 74 U/L (ref 38–126)
Anion gap: 8 (ref 5–15)
BUN: 14 mg/dL (ref 8–23)
CO2: 22 mmol/L (ref 22–32)
Calcium: 8.7 mg/dL — ABNORMAL LOW (ref 8.9–10.3)
Chloride: 102 mmol/L (ref 98–111)
Creatinine, Ser: 1.37 mg/dL — ABNORMAL HIGH (ref 0.44–1.00)
GFR, Estimated: 41 mL/min — ABNORMAL LOW (ref >60)
Glucose, Bld: 163 mg/dL — ABNORMAL HIGH (ref 70–99)
Potassium: 4.5 mmol/L (ref 3.5–5.1)
Sodium: 132 mmol/L — ABNORMAL LOW (ref 135–145)
Total Bilirubin: 0.6 mg/dL (ref 0.3–1.2)
Total Protein: 6.1 g/dL — ABNORMAL LOW (ref 6.5–8.1)

## 2021-10-31 LAB — TYPE AND SCREEN
ABO/RH(D): O POS
Antibody Screen: NEGATIVE

## 2021-10-31 SURGERY — ARTHROPLASTY, KNEE, TOTAL
Anesthesia: Spinal | Site: Knee | Laterality: Left

## 2021-10-31 MED ORDER — PROPOFOL 10 MG/ML IV BOLUS
INTRAVENOUS | Status: DC | PRN
  Administered 2021-10-31: 20 mg via INTRAVENOUS
  Administered 2021-10-31 (×2): 10 mg via INTRAVENOUS

## 2021-10-31 MED ORDER — PREGABALIN 100 MG PO CAPS
200.0000 mg | ORAL_CAPSULE | Freq: Every day | ORAL | Status: DC
  Administered 2021-11-01 – 2021-11-02 (×2): 200 mg via ORAL
  Filled 2021-10-31 (×2): qty 2

## 2021-10-31 MED ORDER — BUPIVACAINE LIPOSOME 1.3 % IJ SUSP
INTRAMUSCULAR | Status: AC
  Filled 2021-10-31: qty 20

## 2021-10-31 MED ORDER — CEFAZOLIN SODIUM-DEXTROSE 2-4 GM/100ML-% IV SOLN
2.0000 g | Freq: Four times a day (QID) | INTRAVENOUS | Status: AC
  Administered 2021-10-31 (×2): 2 g via INTRAVENOUS
  Filled 2021-10-31 (×2): qty 100

## 2021-10-31 MED ORDER — ONDANSETRON HCL 4 MG PO TABS: 4.0000 mg | ORAL_TABLET | Freq: Four times a day (QID) | ORAL | Status: DC | PRN

## 2021-10-31 MED ORDER — CEFAZOLIN SODIUM-DEXTROSE 2-4 GM/100ML-% IV SOLN
2.0000 g | INTRAVENOUS | Status: AC
  Administered 2021-10-31: 2 g via INTRAVENOUS
  Filled 2021-10-31: qty 100

## 2021-10-31 MED ORDER — PHENYLEPHRINE 40 MCG/ML (10ML) SYRINGE FOR IV PUSH (FOR BLOOD PRESSURE SUPPORT)
PREFILLED_SYRINGE | INTRAVENOUS | Status: DC | PRN
Start: 2021-10-31 — End: 2021-10-31
  Administered 2021-10-31: 80 ug via INTRAVENOUS
  Administered 2021-10-31 (×3): 40 ug via INTRAVENOUS
  Administered 2021-10-31 (×4): 80 ug via INTRAVENOUS
  Administered 2021-10-31: 40 ug via INTRAVENOUS
  Administered 2021-10-31 (×3): 80 ug via INTRAVENOUS
  Administered 2021-10-31: 40 ug via INTRAVENOUS
  Administered 2021-10-31 (×3): 80 ug via INTRAVENOUS
  Administered 2021-10-31: 40 ug via INTRAVENOUS

## 2021-10-31 MED ORDER — FLEET ENEMA 7-19 GM/118ML RE ENEM: 1.0000 | ENEMA | Freq: Once | RECTAL | Status: DC | PRN

## 2021-10-31 MED ORDER — OXYCODONE HCL 5 MG PO TABS
5.0000 mg | ORAL_TABLET | ORAL | Status: DC | PRN
  Administered 2021-10-31: 10 mg via ORAL

## 2021-10-31 MED ORDER — MIDAZOLAM HCL 2 MG/2ML IJ SOLN
1.0000 mg | INTRAMUSCULAR | Status: DC
  Filled 2021-10-31: qty 2

## 2021-10-31 MED ORDER — DEXAMETHASONE SODIUM PHOSPHATE 10 MG/ML IJ SOLN
8.0000 mg | Freq: Once | INTRAMUSCULAR | Status: AC
  Administered 2021-10-31: 8 mg via INTRAVENOUS

## 2021-10-31 MED ORDER — ACETAMINOPHEN 500 MG PO TABS
1000.0000 mg | ORAL_TABLET | Freq: Four times a day (QID) | ORAL | Status: AC
  Administered 2021-10-31 – 2021-11-01 (×3): 1000 mg via ORAL
  Filled 2021-10-31 (×4): qty 2

## 2021-10-31 MED ORDER — LACTATED RINGERS IV SOLN: INTRAVENOUS | Status: DC

## 2021-10-31 MED ORDER — PHENYLEPHRINE 40 MCG/ML (10ML) SYRINGE FOR IV PUSH (FOR BLOOD PRESSURE SUPPORT)
PREFILLED_SYRINGE | INTRAVENOUS | Status: AC
  Filled 2021-10-31: qty 10

## 2021-10-31 MED ORDER — CHLORHEXIDINE GLUCONATE 0.12 % MT SOLN
15.0000 mL | Freq: Once | OROMUCOSAL | Status: AC
  Administered 2021-10-31: 15 mL via OROMUCOSAL

## 2021-10-31 MED ORDER — SODIUM CHLORIDE (PF) 0.9 % IJ SOLN
INTRAMUSCULAR | Status: DC | PRN
  Administered 2021-10-31: 60 mL

## 2021-10-31 MED ORDER — SODIUM CHLORIDE 0.9 % IR SOLN
Status: DC | PRN
  Administered 2021-10-31: 1000 mL

## 2021-10-31 MED ORDER — PHENYLEPHRINE 40 MCG/ML (10ML) SYRINGE FOR IV PUSH (FOR BLOOD PRESSURE SUPPORT)
PREFILLED_SYRINGE | INTRAVENOUS | Status: AC
  Filled 2021-10-31: qty 20

## 2021-10-31 MED ORDER — BUPIVACAINE IN DEXTROSE 0.75-8.25 % IT SOLN
INTRATHECAL | Status: DC | PRN
  Administered 2021-10-31: 1.4 mL via INTRATHECAL

## 2021-10-31 MED ORDER — ROSUVASTATIN CALCIUM 10 MG PO TABS
10.0000 mg | ORAL_TABLET | Freq: Every day | ORAL | Status: DC
  Administered 2021-11-01 – 2021-11-02 (×2): 10 mg via ORAL
  Filled 2021-10-31 (×2): qty 1

## 2021-10-31 MED ORDER — METHOCARBAMOL 500 MG IVPB - SIMPLE MED
500.0000 mg | Freq: Four times a day (QID) | INTRAVENOUS | Status: DC | PRN
  Filled 2021-10-31: qty 50

## 2021-10-31 MED ORDER — MORPHINE SULFATE (PF) 2 MG/ML IV SOLN
0.5000 mg | INTRAVENOUS | Status: DC | PRN
Start: 2021-10-31 — End: 2021-11-02

## 2021-10-31 MED ORDER — POVIDONE-IODINE 10 % EX SWAB
2.0000 "application " | Freq: Once | CUTANEOUS | Status: AC
  Administered 2021-10-31: 2 via TOPICAL

## 2021-10-31 MED ORDER — DEXAMETHASONE SODIUM PHOSPHATE 10 MG/ML IJ SOLN
INTRAMUSCULAR | Status: AC
  Filled 2021-10-31: qty 1

## 2021-10-31 MED ORDER — METOPROLOL SUCCINATE ER 50 MG PO TB24
100.0000 mg | ORAL_TABLET | Freq: Every day | ORAL | Status: DC
  Administered 2021-11-01: 100 mg via ORAL
  Filled 2021-10-31: qty 2

## 2021-10-31 MED ORDER — LEVOTHYROXINE SODIUM 100 MCG PO TABS
100.0000 ug | ORAL_TABLET | Freq: Every day | ORAL | Status: DC
  Administered 2021-11-01 – 2021-11-02 (×2): 100 ug via ORAL
  Filled 2021-10-31 (×2): qty 1

## 2021-10-31 MED ORDER — METOCLOPRAMIDE HCL 5 MG/ML IJ SOLN: 5.0000 mg | Freq: Three times a day (TID) | INTRAMUSCULAR | Status: DC | PRN

## 2021-10-31 MED ORDER — PROPOFOL 500 MG/50ML IV EMUL
INTRAVENOUS | Status: AC
  Filled 2021-10-31: qty 50

## 2021-10-31 MED ORDER — ORAL CARE MOUTH RINSE: 15.0000 mL | Freq: Once | OROMUCOSAL | Status: AC

## 2021-10-31 MED ORDER — SODIUM CHLORIDE (PF) 0.9 % IJ SOLN
INTRAMUSCULAR | Status: AC
  Filled 2021-10-31: qty 10

## 2021-10-31 MED ORDER — PROPOFOL 500 MG/50ML IV EMUL
INTRAVENOUS | Status: DC | PRN
  Administered 2021-10-31: 75 ug/kg/min via INTRAVENOUS

## 2021-10-31 MED ORDER — POLYETHYLENE GLYCOL 3350 17 G PO PACK: 17.0000 g | PACK | Freq: Every day | ORAL | Status: DC | PRN

## 2021-10-31 MED ORDER — FLUTICASONE PROPIONATE 50 MCG/ACT NA SUSP
2.0000 | Freq: Every day | NASAL | Status: DC | PRN
  Filled 2021-10-31: qty 16

## 2021-10-31 MED ORDER — ROPIVACAINE HCL 5 MG/ML IJ SOLN
INTRAMUSCULAR | Status: DC | PRN
  Administered 2021-10-31: 20 mL via PERINEURAL

## 2021-10-31 MED ORDER — BUPIVACAINE LIPOSOME 1.3 % IJ SUSP: 20.0000 mL | Freq: Once | INTRAMUSCULAR | Status: DC

## 2021-10-31 MED ORDER — ASPIRIN EC 325 MG PO TBEC
325.0000 mg | DELAYED_RELEASE_TABLET | Freq: Two times a day (BID) | ORAL | Status: DC
  Administered 2021-11-01 – 2021-11-02 (×3): 325 mg via ORAL
  Filled 2021-10-31 (×3): qty 1

## 2021-10-31 MED ORDER — ONDANSETRON HCL 4 MG/2ML IJ SOLN
4.0000 mg | Freq: Four times a day (QID) | INTRAMUSCULAR | Status: DC | PRN
  Administered 2021-10-31: 4 mg via INTRAVENOUS

## 2021-10-31 MED ORDER — DOCUSATE SODIUM 100 MG PO CAPS
100.0000 mg | ORAL_CAPSULE | Freq: Two times a day (BID) | ORAL | Status: DC
  Administered 2021-10-31 – 2021-11-02 (×4): 100 mg via ORAL
  Filled 2021-10-31 (×4): qty 1

## 2021-10-31 MED ORDER — BISACODYL 10 MG RE SUPP: 10.0000 mg | Freq: Every day | RECTAL | Status: DC | PRN

## 2021-10-31 MED ORDER — METOCLOPRAMIDE HCL 5 MG PO TABS: 5.0000 mg | ORAL_TABLET | Freq: Three times a day (TID) | ORAL | Status: DC | PRN

## 2021-10-31 MED ORDER — STERILE WATER FOR IRRIGATION IR SOLN
Status: DC | PRN
  Administered 2021-10-31: 2000 mL

## 2021-10-31 MED ORDER — TRANEXAMIC ACID-NACL 1000-0.7 MG/100ML-% IV SOLN
1000.0000 mg | INTRAVENOUS | Status: AC
  Administered 2021-10-31: 1000 mg via INTRAVENOUS
  Filled 2021-10-31: qty 100

## 2021-10-31 MED ORDER — IRBESARTAN 150 MG PO TABS
150.0000 mg | ORAL_TABLET | Freq: Every day | ORAL | Status: DC
  Administered 2021-11-01: 150 mg via ORAL
  Filled 2021-10-31: qty 1

## 2021-10-31 MED ORDER — DIPHENHYDRAMINE HCL 12.5 MG/5ML PO ELIX: 12.5000 mg | ORAL_SOLUTION | ORAL | Status: DC | PRN

## 2021-10-31 MED ORDER — METHOCARBAMOL 500 MG PO TABS
500.0000 mg | ORAL_TABLET | Freq: Four times a day (QID) | ORAL | Status: DC | PRN
  Administered 2021-11-01: 500 mg via ORAL
  Filled 2021-10-31: qty 1

## 2021-10-31 MED ORDER — FENTANYL CITRATE PF 50 MCG/ML IJ SOSY: 25.0000 ug | PREFILLED_SYRINGE | INTRAMUSCULAR | Status: DC | PRN

## 2021-10-31 MED ORDER — BUPIVACAINE LIPOSOME 1.3 % IJ SUSP
INTRAMUSCULAR | Status: DC | PRN
  Administered 2021-10-31: 20 mL

## 2021-10-31 MED ORDER — FENTANYL CITRATE PF 50 MCG/ML IJ SOSY
50.0000 ug | PREFILLED_SYRINGE | INTRAMUSCULAR | Status: AC
  Administered 2021-10-31: 50 ug via INTRAVENOUS
  Filled 2021-10-31: qty 2

## 2021-10-31 MED ORDER — PHENOL 1.4 % MT LIQD: 1.0000 | OROMUCOSAL | Status: DC | PRN

## 2021-10-31 MED ORDER — LIP MEDEX EX OINT
TOPICAL_OINTMENT | CUTANEOUS | Status: AC
  Administered 2021-10-31: 75
  Filled 2021-10-31: qty 7

## 2021-10-31 MED ORDER — SODIUM CHLORIDE 0.9 % IV SOLN: INTRAVENOUS | Status: DC

## 2021-10-31 MED ORDER — DEXAMETHASONE SODIUM PHOSPHATE 10 MG/ML IJ SOLN
10.0000 mg | Freq: Once | INTRAMUSCULAR | Status: AC
  Administered 2021-11-01: 10 mg via INTRAVENOUS
  Filled 2021-10-31: qty 1

## 2021-10-31 MED ORDER — ACETAMINOPHEN 10 MG/ML IV SOLN
1000.0000 mg | Freq: Four times a day (QID) | INTRAVENOUS | Status: DC
  Administered 2021-10-31: 1000 mg via INTRAVENOUS
  Filled 2021-10-31: qty 100

## 2021-10-31 MED ORDER — MENTHOL 3 MG MT LOZG: 1.0000 | LOZENGE | OROMUCOSAL | Status: DC | PRN

## 2021-10-31 MED ORDER — TRAMADOL HCL 50 MG PO TABS
50.0000 mg | ORAL_TABLET | Freq: Four times a day (QID) | ORAL | Status: DC | PRN
  Administered 2021-10-31 – 2021-11-01 (×2): 50 mg via ORAL
  Filled 2021-10-31 (×2): qty 1

## 2021-10-31 SURGICAL SUPPLY — 56 items
ATTUNE PS FEM LT SZ 5 CEM KNEE (Femur) ×1 IMPLANT
ATTUNE PSRP INSR SZ 5 10M KNEE (Insert) ×2 IMPLANT
BAG COUNTER SPONGE SURGICOUNT (BAG) IMPLANT
BAG SPEC THK2 15X12 ZIP CLS (MISCELLANEOUS) ×1
BAG SPNG CNTER NS LX DISP (BAG)
BAG ZIPLOCK 12X15 (MISCELLANEOUS) ×2 IMPLANT
BASE TIBIAL ROT PLAT SZ 5 KNEE (Knees) ×1 IMPLANT
BLADE SAG 18X100X1.27 (BLADE) ×2 IMPLANT
BLADE SAW SGTL 11.0X1.19X90.0M (BLADE) ×2 IMPLANT
BNDG ELASTIC 6X5.8 VLCR STR LF (GAUZE/BANDAGES/DRESSINGS) ×2 IMPLANT
BOWL SMART MIX CTS (DISPOSABLE) ×2 IMPLANT
BSPLAT TIB 5 CMNT ROT PLAT STR (Knees) ×1 IMPLANT
CEMENT HV SMART SET (Cement) ×4 IMPLANT
COVER SURGICAL LIGHT HANDLE (MISCELLANEOUS) ×2 IMPLANT
CUFF TOURN SGL QUICK 34 (TOURNIQUET CUFF) ×2
CUFF TRNQT CYL 34X4.125X (TOURNIQUET CUFF) ×1 IMPLANT
DECANTER SPIKE VIAL GLASS SM (MISCELLANEOUS) ×2 IMPLANT
DRAPE INCISE IOBAN 66X45 STRL (DRAPES) ×2 IMPLANT
DRAPE U-SHAPE 47X51 STRL (DRAPES) ×2 IMPLANT
DRESSING AQUACEL AG SP 3.5X10 (GAUZE/BANDAGES/DRESSINGS) ×1 IMPLANT
DRSG AQUACEL AG ADV 3.5X10 (GAUZE/BANDAGES/DRESSINGS) ×2 IMPLANT
DRSG AQUACEL AG SP 3.5X10 (GAUZE/BANDAGES/DRESSINGS) ×2
DURAPREP 26ML APPLICATOR (WOUND CARE) ×2 IMPLANT
ELECT REM PT RETURN 15FT ADLT (MISCELLANEOUS) ×2 IMPLANT
GLOVE SRG 8 PF TXTR STRL LF DI (GLOVE) ×1 IMPLANT
GLOVE SURG ENC MOIS LTX SZ6.5 (GLOVE) ×2 IMPLANT
GLOVE SURG ENC MOIS LTX SZ8 (GLOVE) ×4 IMPLANT
GLOVE SURG UNDER POLY LF SZ7 (GLOVE) ×2 IMPLANT
GLOVE SURG UNDER POLY LF SZ8 (GLOVE) ×2
GLOVE SURG UNDER POLY LF SZ8.5 (GLOVE) ×2 IMPLANT
GOWN STRL REUS W/TWL LRG LVL3 (GOWN DISPOSABLE) ×4 IMPLANT
GOWN STRL REUS W/TWL XL LVL3 (GOWN DISPOSABLE) ×2 IMPLANT
HANDPIECE INTERPULSE COAX TIP (DISPOSABLE) ×2
HOLDER FOLEY CATH W/STRAP (MISCELLANEOUS) IMPLANT
IMMOBILIZER KNEE 20 (SOFTGOODS) ×2
IMMOBILIZER KNEE 20 THIGH 36 (SOFTGOODS) ×1 IMPLANT
KIT TURNOVER KIT A (KITS) IMPLANT
MANIFOLD NEPTUNE II (INSTRUMENTS) ×2 IMPLANT
NS IRRIG 1000ML POUR BTL (IV SOLUTION) ×2 IMPLANT
PACK TOTAL KNEE CUSTOM (KITS) ×2 IMPLANT
PADDING CAST COTTON 6X4 STRL (CAST SUPPLIES) ×3 IMPLANT
PATELLA MEDIAL ATTUN 35MM KNEE (Knees) ×1 IMPLANT
PROTECTOR NERVE ULNAR (MISCELLANEOUS) ×2 IMPLANT
SET HNDPC FAN SPRY TIP SCT (DISPOSABLE) ×1 IMPLANT
STRIP CLOSURE SKIN 1/2X4 (GAUZE/BANDAGES/DRESSINGS) ×4 IMPLANT
STRIP CLOSURE SKIN 1/4X4 (GAUZE/BANDAGES/DRESSINGS) ×2 IMPLANT
SUT MNCRL AB 4-0 PS2 18 (SUTURE) ×2 IMPLANT
SUT STRATAFIX 0 PDS 27 VIOLET (SUTURE) ×2
SUT VIC AB 2-0 CT1 27 (SUTURE) ×6
SUT VIC AB 2-0 CT1 TAPERPNT 27 (SUTURE) ×3 IMPLANT
SUTURE STRATFX 0 PDS 27 VIOLET (SUTURE) ×1 IMPLANT
TIBIAL BASE ROT PLAT SZ 5 KNEE (Knees) ×2 IMPLANT
TRAY FOLEY MTR SLVR 16FR STAT (SET/KITS/TRAYS/PACK) ×2 IMPLANT
TUBE SUCTION HIGH CAP CLEAR NV (SUCTIONS) ×2 IMPLANT
WATER STERILE IRR 1000ML POUR (IV SOLUTION) ×4 IMPLANT
WRAP KNEE MAXI GEL POST OP (GAUZE/BANDAGES/DRESSINGS) ×2 IMPLANT

## 2021-10-31 NOTE — Anesthesia Postprocedure Evaluation (Signed)
Anesthesia Post Note  Patient: [name]  Procedure(s) Performed: TOTAL KNEE ARTHROPLASTY (Left: Knee)     Patient location during evaluation: PACU Anesthesia Type: Spinal Level of consciousness: awake and alert Pain management: pain level controlled Vital Signs Assessment: post-procedure vital signs reviewed and stable Respiratory status: spontaneous breathing and respiratory function stable Cardiovascular status: blood pressure returned to baseline and stable Postop Assessment: spinal receding Anesthetic complications: no   No notable events documented.  Last Vitals:  Vitals:   10/31/21 1430 10/31/21 1449  BP: (!) 119/56 108/80  Pulse: 77 75  Resp: 11 16  Temp:  36.6 C  SpO2: 100% 100%    Last Pain:  Vitals:   10/31/21 1449  TempSrc: Oral  PainSc:                  [name]

## 2021-10-31 NOTE — Op Note (Signed)
OPERATIVE REPORT-TOTAL KNEE ARTHROPLASTY   Pre-operative diagnosis- Osteoarthritis  Left knee(s)  Post-operative diagnosis- Osteoarthritis Left knee(s)  Procedure-  Left  Total Knee Arthroplasty  Surgeon- Dione Plover.  Intrieri, MD  Assistant- Theresa Duty, PA-C   Anesthesia-   Adductor canal block and spinal  EBL- 25 ml   Drains None  Tourniquet time-  Total Tourniquet Time Documented: Thigh (Left) - 30 minutes Total: Thigh (Left) - 30 minutes     Complications- None  Condition-PACU - hemodynamically stable.   Brief Clinical Note  [name] is a 75 y.o. year old female with end stage OA of her left knee with progressively worsening pain and dysfunction. She has constant pain, with activity and at rest and significant functional deficits with difficulties even with ADLs. She has had extensive non-op management including analgesics, injections of cortisone and home exercise program, but remains in significant pain with significant dysfunction. Radiographs show bone on bone arthritis lateral and patellofemoral. She presents now for left Total Knee Arthroplasty.     Procedure in detail---   The patient is brought into the operating room and positioned supine on the operating table. After successful administration of  Adductor canal block and spinal,   a tourniquet is placed high on the  Left thigh(s) and the lower extremity is prepped and draped in the usual sterile fashion. Time out is performed by the operating team and then the  Left lower extremity is wrapped in Esmarch, knee flexed and the tourniquet inflated to 300 mmHg.       A midline incision is made with a ten blade through the subcutaneous tissue to the level of the extensor mechanism. A fresh blade is used to make a medial parapatellar arthrotomy. Soft tissue over the proximal medial tibia is subperiosteally elevated to the joint line with a knife and into the semimembranosus bursa with a Cobb elevator. Soft  tissue over the proximal lateral tibia is elevated with attention being paid to avoiding the patellar tendon on the tibial tubercle. The patella is everted, knee flexed 90 degrees and the ACL and PCL are removed. Findings are bone on bone lateral and patellofemoral with large global osteophytes        The drill is used to create a starting hole in the distal femur and the canal is thoroughly irrigated with sterile saline to remove the fatty contents. The 5 degree Left  valgus alignment guide is placed into the femoral canal and the distal femoral cutting block is pinned to remove 9 mm off the distal femur. Resection is made with an oscillating saw.      The tibia is subluxed forward and the menisci are removed. The extramedullary alignment guide is placed referencing proximally at the medial aspect of the tibial tubercle and distally along the second metatarsal axis and tibial crest. The block is pinned to remove 58mm off the more deficient lateral  side. Resection is made with an oscillating saw. Size 5is the most appropriate size for the tibia and the proximal tibia is prepared with the modular drill and keel punch for that size.      The femoral sizing guide is placed and size 5 is most appropriate. Rotation is marked off the epicondylar axis and confirmed by creating a rectangular flexion gap at 90 degrees. The size 5 cutting block is pinned in this rotation and the anterior, posterior and chamfer cuts are made with the oscillating saw. The intercondylar block is then placed and that cut is made.  Trial size 5 tibial component, trial size 5 posterior stabilized femur and a 10  mm posterior stabilized rotating platform insert trial is placed. Full extension is achieved with excellent varus/valgus and anterior/posterior balance throughout full range of motion. The patella is everted and thickness measured to be 22  mm. Free hand resection is taken to 12 mm, a 35 template is placed, lug holes are drilled,  trial patella is placed, and it tracks normally. Osteophytes are removed off the posterior femur with the trial in place. All trials are removed and the cut bone surfaces prepared with pulsatile lavage. Cement is mixed and once ready for implantation, the size 5 tibial implant, size  5 posterior stabilized femoral component, and the size 35 patella are cemented in place and the patella is held with the clamp. The trial insert is placed and the knee held in full extension. The Exparel (20 ml mixed with 60 ml saline) is injected into the extensor mechanism, posterior capsule, medial and lateral gutters and subcutaneous tissues.  All extruded cement is removed and once the cement is hard the permanent 10 mm posterior stabilized rotating platform insert is placed into the tibial tray.      The wound is copiously irrigated with saline solution and the extensor mechanism closed with # 0 Stratofix suture. The tourniquet is released for a total tourniquet time of 30  minutes. Flexion against gravity is 140 degrees and the patella tracks normally. Subcutaneous tissue is closed with 2.0 vicryl and subcuticular with running 4.0 Monocryl. The incision is cleaned and dried and steri-strips and a bulky sterile dressing are applied. The limb is placed into a knee immobilizer and the patient is awakened and transported to recovery in stable condition.      Please note that a surgical assistant was a medical necessity for this procedure in order to perform it in a safe and expeditious manner. Surgical assistant was necessary to retract the ligaments and vital neurovascular structures to prevent injury to them and also necessary for proper positioning of the limb to allow for anatomic placement of the prosthesis.   Dione Plover Tonna Palazzi, MD    10/31/2021, 12:37 PM

## 2021-10-31 NOTE — Anesthesia Preprocedure Evaluation (Signed)
Anesthesia Evaluation  Patient identified by MRN, date of birth, ID band Patient awake    Reviewed: Allergy & Precautions, NPO status , Patient's Chart, lab work & pertinent test results  Airway Mallampati: II  TM Distance: >3 FB Neck ROM: Full    Dental  (+) Dental Advisory Given   Pulmonary former smoker,    breath sounds clear to auscultation       Cardiovascular hypertension, Pt. on medications and Pt. on home beta blockers  Rhythm:Regular Rate:Normal     Neuro/Psych CVA    GI/Hepatic Neg liver ROS, PUD,   Endo/Other  Hypothyroidism   Renal/GU CRFRenal disease     Musculoskeletal  (+) Arthritis ,   Abdominal   Peds  Hematology negative hematology ROS (+)   Anesthesia Other Findings   Reproductive/Obstetrics                             Lab Results  Component Value Date   WBC 9.5 10/27/2021   HGB 12.9 10/27/2021   HCT 38.7 10/27/2021   MCV 98.7 10/27/2021   PLT 221 10/27/2021   Lab Results  Component Value Date   CREATININE 1.70 (H) 10/27/2021   BUN 15 10/27/2021   NA 133 (L) 10/27/2021   K 5.3 (H) 10/27/2021   CL 101 10/27/2021   CO2 21 (L) 10/27/2021    Anesthesia Physical Anesthesia Plan  ASA: 3  Anesthesia Plan: Spinal   Post-op Pain Management: Regional block and Ofirmev IV (intra-op)   Induction:   PONV Risk Score and Plan: 2 and Propofol infusion, Ondansetron and Treatment may vary due to age or medical condition  Airway Management Planned: Natural Airway and Simple Face Mask  Additional Equipment:   Intra-op Plan:   Post-operative Plan:   Informed Consent: I have reviewed the patients History and Physical, chart, labs and discussed the procedure including the risks, benefits and alternatives for the proposed anesthesia with the patient or authorized representative who has indicated his/her understanding and acceptance.       Plan Discussed with:    Anesthesia Plan Comments:         Anesthesia Quick Evaluation

## 2021-10-31 NOTE — Interval H&P Note (Signed)
History and Physical Interval Note:  10/31/2021 9:36 AM  [name]  has presented today for surgery, with the diagnosis of left knee osteoarthritis.  The various methods of treatment have been discussed with the patient and family. After consideration of risks, benefits and other options for treatment, the patient has consented to  Procedure(s): TOTAL KNEE ARTHROPLASTY (Left) as a surgical intervention.  The patient's history has been reviewed, patient examined, no change in status, stable for surgery.  I have reviewed the patient's chart and labs.  Questions were answered to the patient's satisfaction.     Pilar Plate Tahji 

## 2021-10-31 NOTE — Transfer of Care (Signed)
Immediate Anesthesia Transfer of Care Note  Patient: [name]  Procedure(s) Performed: TOTAL KNEE ARTHROPLASTY (Left: Knee)  Patient Location: PACU  Anesthesia Type:MAC, Regional and Spinal  Level of Consciousness: awake, alert  and oriented  Airway & Oxygen Therapy: Patient Spontanous Breathing  Post-op Assessment: Report given to RN and Post -op Vital signs reviewed and stable  Post vital signs: Reviewed and stable  Last Vitals:  Vitals Value Taken Time  BP    Temp    Pulse 78 10/31/21 1259  Resp 18 10/31/21 1300  SpO2 99 % 10/31/21 1259  Vitals shown include unvalidated device data.  Last Pain:  Vitals:   10/31/21 0956  TempSrc:   PainSc: 5       Patients Stated Pain Goal: 3 (54/65/03 5465)  Complications: No notable events documented.

## 2021-10-31 NOTE — H&P (Signed)
TOTAL KNEE ADMISSION H&P  Patient is being admitted for left total knee arthroplasty.  Subjective:  Chief Complaint: Left knee pain.  HPI: [name], 75 y.o. female has a history of pain and functional disability in the left knee due to arthritis and has failed non-surgical conservative treatments for greater than 12 weeks to include NSAID's and/or analgesics, flexibility and strengthening excercises, and activity modification. Onset of symptoms was gradual, starting  several  years ago with gradually worsening course since that time. The patient noted no past surgery on the left knee.  Patient currently rates pain in the left knee at 7 out of 10 with activity. Patient has worsening of pain with activity and weight bearing, pain that interferes with activities of daily living, pain with passive range of motion, and crepitus. Patient has evidence of periarticular osteophytes and joint space narrowing by imaging studies. There is no active infection.  Patient Active Problem List   Diagnosis Date Noted   Ileitis    Arthritis    Hyponatremia 04/21/2019   Spinal stenosis, lumbar region with neurogenic claudication 05/25/2017   Sinus tachycardia 06/29/2016   Transaminitis 03/10/2016   Pelvic mass 01/27/2016   Colonic ulcer    Diverticulosis of colon without hemorrhage    Iron deficiency anemia due to chronic blood loss    Lumbar pain 12/14/2015   Chronic kidney disease (CKD), stage II (mild) 10/03/2012   Carotid artery narrowing 10/03/2012   Hypokalemia 10/02/2012   Hypothyroidism 10/02/2012   Essential hypertension 01/09/2012   Hyperlipidemia 01/09/2012   B12 deficiency 01/09/2012    Past Medical History:  Diagnosis Date   Acute pyelonephritis 10/15/2019   Acute renal failure superimposed on stage 2 chronic kidney disease ([place]) 10/15/2019   Alcoholism ([place]) 01/09/2012   Anxiety    Arthritis    Left knee   B12 deficiency    Carotid artery narrowing 10/03/2012   On the  right.   Chronic kidney disease (CKD), stage II (mild)    Depression    Headache(784.0)    Hyperlipidemia    Hypertension    Hyponatremia 01/09/2012   Hypothyroid    Menopause    Mini stroke    Orthostatic syncope 10/02/2012   SBO (small bowel obstruction) ([place]) 06/28/2016   Stroke ([place]) 2010   no deficits   Thrombocytopenia ([place]) 10/15/2019    Past Surgical History:  Procedure Laterality Date   AGILE CAPSULE N/A 02/22/2016   dummy capsule remained in the distal ileum   APPENDECTOMY     CATARACT EXTRACTION W/PHACO  06/20/2012   Procedure: CATARACT EXTRACTION PHACO AND INTRAOCULAR LENS PLACEMENT ([place]);  Surgeon: Tonny Branch, MD;  Location: [facility];  Service: Ophthalmology;  Laterality: Right;  CDE:10.66   CATARACT EXTRACTION W/PHACO  07/11/2012   Procedure: CATARACT EXTRACTION PHACO AND INTRAOCULAR LENS PLACEMENT ([facility]);  Surgeon: Tonny Branch, MD;  Location: [facility];  Service: Ophthalmology;  Laterality: Left;  CDE: 12.69   COLONOSCOPY N/A 12/17/2015   RMR: Marked circumferential ulceration of the ascending colon/Ileocecal valve ulceration with biopsy most consistent with NSAID injury versus inflammatory bowel disease, pancolonoc diverticulosis redundant colon   ESOPHAGOGASTRODUODENOSCOPY N/A 12/17/2015   [id number] gastric polyp, biopsy benign   KNEE ARTHROSCOPY Right 09/2016   LUMBAR LAMINECTOMY/DECOMPRESSION MICRODISCECTOMY N/A 05/25/2017   Procedure: CENTRAL DECOMPRESSIVE LUMBAR LAMINECTOMY FOR SPINAL STENOSIS L3-L4, FORAMINOTOMY FOR THE L4 ROOT AND L5 ROOT;  Surgeon: Latanya Maudlin, MD;  Location: [facility];  Service: Orthopedics;  Laterality: N/A;   TONSILLECTOMY  Prior to Admission medications   Medication Sig Start Date End Date Taking? Authorizing Provider  alendronate (FOSAMAX) 70 MG tablet Take 1 tablet (70 mg total) by mouth once a week. Take with a full glass of water on an empty stomach. 01/26/21  Yes Stacks, Cletus Gash, MD  fluconazole (DIFLUCAN) 100 MG tablet Take two with  first dose. Then starting the next day take one daily until all are taken. 07/29/21  Yes Stacks, Cletus Gash, MD  fluticasone (FLONASE) 50 MCG/ACT nasal spray Place 2 sprays into both nostrils daily. Patient taking differently: Place 2 sprays into both nostrils daily as needed for allergies. 07/26/21  Yes Stacks, Cletus Gash, MD  folic acid (FOLVITE) 741 MCG tablet Take 2,400 mcg by mouth daily.   Yes [provider]  levothyroxine (SYNTHROID) 100 MCG tablet Take 1 tablet (100 mcg total) by mouth daily. 09/21/21  Yes Claretta Fraise, MD  metoprolol succinate (TOPROL-XL) 100 MG 24 hr tablet Take 1 tablet (100 mg total) by mouth daily. For blood pressure control 09/21/21  Yes Stacks, Cletus Gash, MD  olmesartan (BENICAR) 20 MG tablet Take 1 tablet by mouth once daily 09/21/21  Yes Stacks, Cletus Gash, MD  pregabalin (LYRICA) 200 MG capsule Take 1 capsule (200 mg total) by mouth daily. 09/21/21  Yes Stacks, Cletus Gash, MD  rosuvastatin (CRESTOR) 10 MG tablet TAKE 1 TABLET EVERY DAY 06/14/21  Yes Stacks, Cletus Gash, MD  Vitamin D, Ergocalciferol, (DRISDOL) 1.25 MG (50000 UNIT) CAPS capsule Take 1 capsule (50,000 Units total) by mouth every 7 (seven) days. 10/13/21 10/12/22 Yes Claretta Fraise, MD  aspirin EC 81 MG tablet Take 81 mg by mouth daily. Patient not taking: Reported on 10/13/2021    [provider]  fexofenadine-pseudoephedrine (ALLEGRA-D 24) 180-240 MG 24 hr tablet Take 1 tablet by mouth every evening. For allergy and congestion Patient not taking: Reported on 10/13/2021 07/26/21   Claretta Fraise, MD  thiamine (VITAMIN B-1) 100 MG tablet Take 1 tablet (100 mg total) by mouth daily. Patient not taking: Reported on 10/13/2021 04/20/16   Claretta Fraise, MD    Allergies  Allergen Reactions   Ciprofloxacin Other (See Comments)    Caused pt to have delusional thoughts and hallucinations   Procardia [Nifedipine] Swelling    Edema     Norvasc [Amlodipine Besylate] Hives    Social History   Socioeconomic  History   Marital status: Widowed    Spouse name: Not on file   Number of children: 2   Years of education: 14   Highest education level: Some college, no degree  Occupational History   Occupation: Psychologist, educational: [employer]    Comment: Retired  Tobacco Use   Smoking status: Former    Packs/day: 2.00    Years: 30.00    Pack years: 60.00    Types: Cigarettes    Quit date: 06/17/1992    Years since quitting: 29.3   Smokeless tobacco: Never  Vaping Use   Vaping Use: Never used  Substance and Sexual Activity   Alcohol use: No   Drug use: No   Sexual activity: Not Currently  Other Topics Concern   Not on file  Social History Narrative   Not on file   Social Determinants of Health   Financial Resource Strain: Low Risk    Difficulty of Paying Living Expenses: Not hard at all  Food Insecurity: No Food Insecurity   Worried About [place] in the Last Year: Never true   Ran Out  of Food in the Last Year: Never true  Transportation Needs: No Transportation Needs   Lack of Transportation (Medical): No   Lack of Transportation (Non-Medical): No  Physical Activity: Inactive   Days of Exercise per Week: 0 days   Minutes of Exercise per Session: 0 min  Stress: No Stress Concern Present   Feeling of Stress : Not at all  Social Connections: Moderately Integrated   Frequency of Communication with Friends and Family: More than three times a week   Frequency of Social Gatherings with Friends and Family: More than three times a week   Attends Religious Services: More than 4 times per year   Active Member of [employer] or Organizations: Yes   Attends Archivist Meetings: More than 4 times per year   Marital Status: Widowed  Human resources officer Violence: Not At Risk   Fear of Current or Ex-Partner: No   Emotionally Abused: No   Physically Abused: No   Sexually Abused: No    Tobacco Use: Medium Risk   Smoking Tobacco Use: Former   Smokeless Tobacco  Use: Never   Passive Exposure: Not on file   Social History   Substance and Sexual Activity  Alcohol Use No    Family History  Problem Relation Age of Onset   GER disease Mother    Ulcers Mother    Hypertension Maternal Grandmother     ROS: Constitutional: no fever, no chills, no night sweats, no significant weight loss Cardiovascular: no chest pain, no palpitations Respiratory: no cough, no shortness of breath, No COPD Gastrointestinal: no vomiting, no nausea Musculoskeletal: no swelling in Joints, Joint Pain Neurologic: no numbness, no tingling, no difficulty with balance   Objective:  Physical Exam: Well nourished and well developed.  General: Alert and oriented x3, cooperative and pleasant, no acute distress.  Head: normocephalic, atraumatic, neck supple.  Eyes: EOMI.  Respiratory: breath sounds clear in all fields, no wheezing, rales, or rhonchi. Cardiovascular: Regular rate and rhythm, no murmurs, gallops or rubs.  Abdomen: non-tender to palpation and soft, normoactive bowel sounds. Musculoskeletal:  Antalgic gait pattern favoring the left side with the use of a cane.   Bilateral Hip Exam:  ROM: Normal without discomfort.   Right Knee Exam:  No effusion.  Range of motion is 0-125 degrees.  No crepitus on range of motion of the knee.  No medial joint line tenderness.  No lateral joint line tenderness.  Stable knee.   Left Knee Exam:  No effusion.  Slight valgus deformity.  Range of motion is 5 to 125 degrees.  Moderate crepitus on range of motion of the knee.  Positive lateral greater than medial joint line tenderness.  Stable knee.  Calves soft and nontender. Motor function intact in LE. Strength 5/5 LE bilaterally. Neuro: Distal pulses 2+. Sensation to light touch intact in LE.    Vital signs in last 24 hours:    Imaging Review  Radiographs- AP and lateral of the left knee dated 08/18/2021 demonstrate bone-on-bone arthritis in the lateral  compartment and patellofemoral compartment.  Assessment/Plan:  End stage arthritis, left knee   The patient history, physical examination, clinical judgment of the provider and imaging studies are consistent with end stage degenerative joint disease of the left knee and total knee arthroplasty is deemed medically necessary. The treatment options including medical management, injection therapy arthroscopy and arthroplasty were discussed at length. The risks and benefits of total knee arthroplasty were presented and reviewed. The risks due to aseptic loosening,  infection, stiffness, patella tracking problems, thromboembolic complications and other imponderables were discussed. The patient acknowledged the explanation, agreed to proceed with the plan and consent was signed. Patient is being admitted for inpatient treatment for surgery, pain control, PT, OT, prophylactic antibiotics, VTE prophylaxis, progressive ambulation and ADLs and discharge planning. The patient is planning to be discharged  home .   Patient's anticipated LOS is less than 2 midnights, meeting these requirements: - Lives within 1 hour of care - Has a competent adult at home to recover with post-op - NO history of  - Chronic pain requiring opioids  - Diabetes  - Coronary Artery Disease  - Heart failure  - Heart attack  - Stroke  - DVT/VTE  - Cardiac arrhythmia  - Respiratory Failure/COPD  - Renal failure  - Anemia  - Advanced Liver disease    Therapy Plans: [place] Disposition: Home with Husband Planned DVT Prophylaxis: Aspirin 325mg  DME Needed: 3-in-1 PCP: Claretta Fraise, MD (clearance not received - patient contacting) TXA: IV Allergies: Amlodipine, Cipro, Nifedipine Anesthesia Concerns: None BMI: 19.6 Last HgbA1c: N/A  Pharmacy: [facility] in [place]  - Patient was instructed on what medications to stop prior to surgery. - Follow-up visit in 2 weeks with Dr. Wynelle Link - Begin physical therapy  following surgery - Pre-operative lab work as pre-surgical testing - Prescriptions will be provided in hospital at time of discharge  Fenton Foy, [facility], PA-C Orthopedic Surgery [facility] [place]

## 2021-10-31 NOTE — Evaluation (Signed)
Physical Therapy Evaluation Patient Details Name: [name] MRN: [id number] DOB: Dec 17, 1945 Today's Date: 10/31/2021  History of Present Illness  Patient is 75 y.o. 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    Clinical Impression  [name] is a 75 y.o. female POD 0 s/p Lt TKA. Patient reports independence with [facility] for mobility at baseline. Patient is now limited by functional impairments (see PT problem list below) and requires min assist for transfers and gait with RW. Patient was able to ambulate ~50 feet with RW and min assist. Patient instructed in exercise to facilitate circulation to manage edema and reduce risk of DVT. Patient will benefit from continued skilled PT interventions to address impairments and progress towards PLOF. Acute PT will follow to progress mobility and stair training in preparation for safe discharge home.        Recommendations for follow up therapy are one component of a multi-disciplinary discharge planning process, led by the attending physician.  Recommendations may be updated based on patient status, additional functional criteria and insurance authorization.  Follow Up Recommendations Follow physician's recommendations for discharge plan and follow up therapies    Assistance Recommended at Discharge Frequent or constant Supervision/Assistance  Functional Status Assessment Patient has had a recent decline in their functional status and demonstrates the ability to make significant improvements in function in a reasonable and predictable amount of time.  Equipment Recommendations  None recommended by PT    Recommendations for Other Services       Precautions / Restrictions Precautions Precautions: Fall Restrictions Weight Bearing Restrictions: No Other Position/Activity Restrictions: WBAT      Mobility  Bed Mobility Overal bed mobility: Needs Assistance Bed Mobility:  Supine to Sit     Supine to sit: Min assist;HOB elevated     General bed mobility comments: cues for use of bedrail and assist to bring LE's off EOB    Transfers Overall transfer level: Needs assistance Equipment used: Rolling walker (2 wheels) Transfers: Sit to/from Stand Sit to Stand: Min assist           General transfer comment: cues for hand placement on RW and assist to rise from EOB. pt steady once standing.    Ambulation/Gait Ambulation/Gait assistance: Min assist Gait Distance (Feet): 50 Feet Assistive device: Rolling walker (2 wheels) Gait Pattern/deviations: Step-to pattern;Decreased stride length;Decreased weight shift to left Gait velocity: decr     General Gait Details: cues for step to pattern and proximity to RW, no overt LOB noted and no buckling at Lt knee thorughout. assist to mange walker and cues for posture throughout gait.  Stairs            Wheelchair Mobility    Modified Rankin (Stroke Patients Only)       Balance Overall balance assessment: Needs assistance Sitting-balance support: Feet supported Sitting balance-Leahy Scale: Good     Standing balance support: Reliant on assistive device for balance;During functional activity;Bilateral upper extremity supported Standing balance-Leahy Scale: Poor                               Pertinent Vitals/Pain Pain Assessment: Faces Faces Pain Scale: Hurts even more Pain Location: Lt knee Pain Descriptors / Indicators: Aching;Discomfort;Guarding Pain Intervention(s): Limited activity within patient's tolerance;Repositioned;Ice applied;Monitored during session    [place] expects to be discharged to:: Private residence Living Arrangements: Spouse/significant other Available Help  at Discharge: Family Type of Home: House Home Access: Stairs to enter Entrance Stairs-Rails:  (there is a post) Technical brewer of Steps: 2   Home Layout: One level Home  Equipment: Conservation officer, nature (2 wheels);Cane - single point      Prior Function Prior Level of Function : Independent/Modified Independent                     Hand Dominance   Dominant Hand: Right    Extremity/Trunk Assessment   Upper Extremity Assessment Upper Extremity Assessment: Overall WFL for tasks assessed    Lower Extremity Assessment Lower Extremity Assessment: LLE deficits/detail LLE Sensation: WNL LLE Coordination: WNL    Cervical / Trunk Assessment Cervical / Trunk Assessment: Normal  Communication   Communication: No difficulties  Cognition Arousal/Alertness: Awake/alert Behavior During Therapy: WFL for tasks assessed/performed Overall Cognitive Status: Within Functional Limits for tasks assessed                                          General Comments      Exercises     Assessment/Plan    PT Assessment Patient needs continued PT services  PT Problem List Decreased strength;Decreased range of motion;Decreased activity tolerance;Decreased balance;Decreased mobility;Decreased knowledge of use of DME;Decreased knowledge of precautions;Pain       PT Treatment Interventions DME instruction;Gait training;Stair training;Functional mobility training;Therapeutic activities;Therapeutic exercise;Balance training;Patient/family education    PT Goals (Current goals can be found in the Care Plan section)  Acute Rehab PT Goals Patient Stated Goal: get back home and knees and back to stop hurting PT Goal Formulation: With patient Time For Goal Achievement: 11/07/21 Potential to Achieve Goals: Good    Frequency 7X/week   Barriers to discharge        Co-evaluation               AM-PAC PT "6 Clicks" Mobility  Outcome Measure Help needed turning from your back to your side while in a flat bed without using bedrails?: A Little Help needed moving from lying on your back to sitting on the side of a flat bed without using bedrails?: A  Little Help needed moving to and from a bed to a chair (including a wheelchair)?: A Little Help needed standing up from a chair using your arms (e.g., wheelchair or bedside chair)?: A Little Help needed to walk in hospital room?: A Little Help needed climbing 3-5 steps with a railing? : A Little 6 Click Score: 18    End of Session Equipment Utilized During Treatment: Gait belt Activity Tolerance: Patient tolerated treatment well Patient left: in chair;with call bell/phone within reach;with chair alarm set Nurse Communication: Mobility status PT Visit Diagnosis: Difficulty in walking, not elsewhere classified (R26.2);Muscle weakness (generalized) (M62.81)    Time: 6269-4854 PT Time Calculation (min) (ACUTE ONLY): 26 min   Charges:   PT Evaluation $PT Eval Low Complexity: 1 Low PT Treatments $Gait Training: 8-22 mins        Verner Mould, DPT Acute Rehabilitation Services Office [phone] Pager [phone]   Jacques Navy 10/31/2021, 6:24 PM

## 2021-10-31 NOTE — Anesthesia Procedure Notes (Addendum)
  Anesthesia Regional Block: Adductor canal block   Pre-Anesthetic Checklist: , timeout performed,  Correct Patient, Correct Site, Correct Laterality,  Correct Procedure, Correct Position, site marked,  Risks and benefits discussed,  Surgical consent,  Pre-op evaluation,  At surgeon's request and post-op pain management  Laterality: Left  Prep: chloraprep       Needles:  Injection technique: Single-shot  Needle Type: Echogenic Needle     Needle Length: 9cm  Needle Gauge: 21     Additional Needles:   Procedures:,,,, ultrasound used (permanent image in chart),,    Narrative:  Start time: 10/31/2021 10:42 AM End time: 10/31/2021 10:49 AM Injection made incrementally with aspirations every 5 mL.  Performed by: Personally  Anesthesiologist: Suzette Battiest, MD

## 2021-10-31 NOTE — Care Plan (Signed)
Ortho Bundle Case Management Note  Patient Details  Name: [name] MRN: [id number] Date of Birth: 1946-02-14  L TKA on 10-31-21 DCP:  Home with spou[place].   DME:  Has a RW.  3-in-1 ordered through [facility] PT:  [facility] on 11-02-21.                   DME Arranged:  Walker rolling, 3-N-1 DME Agency:  [facility]  HH Arranged:  NA HH Agency:  NA  Additional Comments: Please contact me with any questions of if this plan should need to change.  Marianne Sofia, RN,CCM [facility]  [phone] 10/31/2021, 4:59 PM

## 2021-10-31 NOTE — Progress Notes (Signed)
Assisted Dr. Rob Fitzgerald with left, ultrasound guided, adductor canal block. Side rails up, monitors on throughout procedure. See vital signs in flow sheet. Tolerated Procedure well.  

## 2021-10-31 NOTE — Discharge Instructions (Signed)
 Frank Aluisio, MD Total Joint Specialist [facility] [address] [place], [place] [address] [phone]  TOTAL KNEE REPLACEMENT POSTOPERATIVE DIRECTIONS    Knee Rehabilitation, Guidelines Following Surgery  Results after knee surgery are often greatly improved when you follow the exercise, range of motion and muscle strengthening exercises prescribed by your doctor. Safety measures are also important to protect the knee from further injury. If any of these exercises cause you to have increased pain or swelling in your knee joint, decrease the amount until you are comfortable again and slowly increase them. If you have problems or questions, call your caregiver or physical therapist for advice.   BLOOD CLOT PREVENTION Take a 325 mg Aspirin two times a day for three weeks following surgery. Then take an 81 mg Aspirin once a day for three weeks. Then discontinue Aspirin. You may resume your vitamins/supplements upon discharge from the hospital. Do not take any NSAIDs (Advil, Aleve, Ibuprofen, Meloxicam, etc.) until you have discontinued the 325 mg Aspirin.  HOME CARE INSTRUCTIONS  Remove items at home which could result in a fall. This includes throw rugs or furniture in walking pathways.  ICE to the affected knee as much as tolerated. Icing helps control swelling. If the swelling is well controlled you will be more comfortable and rehab easier. Continue to use ice on the knee for pain and swelling from surgery. You may notice swelling that will progress down to the foot and ankle. This is normal after surgery. Elevate the leg when you are not up walking on it.    Continue to use the breathing machine which will help keep your temperature down. It is common for your temperature to cycle up and down following surgery, especially at night when you are not up moving around and exerting yourself. The breathing machine keeps your lungs expanded and your temperature  down. Do not place pillow under the operative knee, focus on keeping the knee straight while resting  DIET You may resume your previous home diet once you are discharged from the hospital.  DRESSING / WOUND CARE / SHOWERING Keep your bulky bandage on for 2 days. On the third post-operative day you may remove the Ace bandage and gauze. There is a waterproof adhesive bandage on your skin which will stay in place until your first follow-up appointment. Once you remove this you will not need to place another bandage You may begin showering 3 days following surgery, but do not submerge the incision under water.  ACTIVITY For the first 5 days, the key is rest and control of pain and swelling Do your home exercises twice a day starting on post-operative day 3. On the days you go to physical therapy, just do the home exercises once that day. You should rest, ice and elevate the leg for 50 minutes out of every hour. Get up and walk/stretch for 10 minutes per hour. After 5 days you can increase your activity slowly as tolerated. Walk with your walker as instructed. Use the walker until you are comfortable transitioning to a cane. Walk with the cane in the opposite hand of the operative leg. You may discontinue the cane once you are comfortable and walking steadily. Avoid periods of inactivity such as sitting longer than an hour when not asleep. This helps prevent blood clots.  You may discontinue the knee immobilizer once you are able to perform a straight leg raise while lying down. You may resume a sexual relationship in one month   or when given the OK by your doctor.  You may return to work once you are cleared by your doctor.  Do not drive a car for 6 weeks or until released by your surgeon.  Do not drive while taking narcotics.  TED HOSE STOCKINGS Wear the elastic stockings on both legs for three weeks following surgery during the day. You may remove them at night for sleeping.  WEIGHT  BEARING Weight bearing as tolerated with assist device (walker, cane, etc) as directed, use it as long as suggested by your surgeon or therapist, typically at least 4-6 weeks.  POSTOPERATIVE CONSTIPATION PROTOCOL Constipation - defined medically as fewer than three stools per week and severe constipation as less than one stool per week.  One of the most common issues patients have following surgery is constipation.  Even if you have a regular bowel pattern at home, your normal regimen is likely to be disrupted due to multiple reasons following surgery.  Combination of anesthesia, postoperative narcotics, change in appetite and fluid intake all can affect your bowels.  In order to avoid complications following surgery, here are some recommendations in order to help you during your recovery period.  Colace (docusate) - Pick up an over-the-counter form of Colace or another stool softener and take twice a day as long as you are requiring postoperative pain medications.  Take with a full glass of water daily.  If you experience loose stools or diarrhea, hold the colace until you stool forms back up. If your symptoms do not get better within 1 week or if they get worse, check with your doctor. Dulcolax (bisacodyl) - Pick up over-the-counter and take as directed by the product packaging as needed to assist with the movement of your bowels.  Take with a full glass of water.  Use this product as needed if not relieved by Colace only.  MiraLax (polyethylene glycol) - Pick up over-the-counter to have on hand. MiraLax is a solution that will increase the amount of water in your bowels to assist with bowel movements.  Take as directed and can mix with a glass of water, juice, soda, coffee, or tea. Take if you go more than two days without a movement. Do not use MiraLax more than once per day. Call your doctor if you are still constipated or irregular after using this medication for 7 days in a row.  If you continue  to have problems with postoperative constipation, please contact the office for further assistance and recommendations.  If you experience "the worst abdominal pain ever" or develop nausea or vomiting, please contact the office immediatly for further recommendations for treatment.  ITCHING If you experience itching with your medications, try taking only a single pain pill, or even half a pain pill at a time.  You can also use Benadryl over the counter for itching or also to help with sleep.   MEDICATIONS See your medication summary on the "After Visit Summary" that the nursing staff will review with you prior to discharge.  You may have some home medications which will be placed on hold until you complete the course of blood thinner medication.  It is important for you to complete the blood thinner medication as prescribed by your surgeon.  Continue your approved medications as instructed at time of discharge.  PRECAUTIONS If you experience chest pain or shortness of breath - call 911 immediately for transfer to the hospital emergency department.  If you develop a fever greater that 101 F,   purulent drainage from wound, increased redness or drainage from wound, foul odor from the wound/dressing, or calf pain - CONTACT YOUR SURGEON.                                                   FOLLOW-UP APPOINTMENTS Make sure you keep all of your appointments after your operation with your surgeon and caregivers. You should call the office at the above phone number and make an appointment for approximately two weeks after the date of your surgery or on the date instructed by your surgeon outlined in the "After Visit Summary".  RANGE OF MOTION AND STRENGTHENING EXERCISES  Rehabilitation of the knee is important following a knee injury or an operation. After just a few days of immobilization, the muscles of the thigh which control the knee become weakened and shrink (atrophy). Knee exercises are designed to build up  the tone and strength of the thigh muscles and to improve knee motion. Often times heat used for twenty to thirty minutes before working out will loosen up your tissues and help with improving the range of motion but do not use heat for the first two weeks following surgery. These exercises can be done on a training (exercise) mat, on the floor, on a table or on a bed. Use what ever works the best and is most comfortable for you Knee exercises include:  Leg Lifts - While your knee is still immobilized in a splint or cast, you can do straight leg raises. Lift the leg to 60 degrees, hold for 3 sec, and slowly lower the leg. Repeat 10-20 times 2-3 times daily. Perform this exercise against resistance later as your knee gets better.  Quad and Hamstring Sets - Tighten up the muscle on the front of the thigh (Quad) and hold for 5-10 sec. Repeat this 10-20 times hourly. Hamstring sets are done by pushing the foot backward against an object and holding for 5-10 sec. Repeat as with quad sets.  Leg Slides: Lying on your back, slowly slide your foot toward your buttocks, bending your knee up off the floor (only go as far as is comfortable). Then slowly slide your foot back down until your leg is flat on the floor again. Angel Wings: Lying on your back spread your legs to the side as far apart as you can without causing discomfort.  A rehabilitation program following serious knee injuries can speed recovery and prevent re-injury in the future due to weakened muscles. Contact your doctor or a physical therapist for more information on knee rehabilitation.   POST-OPERATIVE OPIOID TAPER INSTRUCTIONS: It is important to wean off of your opioid medication as soon as possible. If you do not need pain medication after your surgery it is ok to stop day one. Opioids include: Codeine, Hydrocodone(Norco, Vicodin), Oxycodone(Percocet, oxycontin) and hydromorphone amongst others.  Long term and even short term use of opiods can  cause: Increased pain response Dependence Constipation Depression Respiratory depression And more.  Withdrawal symptoms can include Flu like symptoms Nausea, vomiting And more Techniques to manage these symptoms Hydrate well Eat regular healthy meals Stay active Use relaxation techniques(deep breathing, meditating, yoga) Do Not substitute Alcohol to help with tapering If you have been on opioids for less than two weeks and do not have pain than it is ok to stop all together.  Plan   to wean off of opioids This plan should start within one week post op of your joint replacement. Maintain the same interval or time between taking each dose and first decrease the dose.  Cut the total daily intake of opioids by one tablet each day Next start to increase the time between doses. The last dose that should be eliminated is the evening dose.   IF YOU ARE TRANSFERRED TO A SKILLED REHAB FACILITY If the patient is transferred to a skilled rehab facility following release from the hospital, a list of the current medications will be sent to the facility for the patient to continue.  When discharged from the skilled rehab facility, please have the facility set up the patient's Home Health Physical Therapy prior to being released. Also, the skilled facility will be responsible for providing the patient with their medications at time of release from the facility to include their pain medication, the muscle relaxants, and their blood thinner medication. If the patient is still at the rehab facility at time of the two week follow up appointment, the skilled rehab facility will also need to assist the patient in arranging follow up appointment in our office and any transportation needs.  MAKE SURE YOU:  Understand these instructions.  Get help right away if you are not doing well or get worse.   DENTAL ANTIBIOTICS:  In most cases prophylactic antibiotics for Dental procdeures after total joint surgery are  not necessary.  Exceptions are as follows:  1. History of prior total joint infection  2. Severely immunocompromised (Organ Transplant, cancer chemotherapy, Rheumatoid biologic meds such as Humera)  3. Poorly controlled diabetes (A1C &gt; 8.0, blood glucose over 200)  If you have one of these conditions, contact your surgeon for an antibiotic prescription, prior to your dental procedure.    Pick up stool softner and laxative for home use following surgery while on pain medications. Do not submerge incision under water. Please use good hand washing techniques while changing dressing each day. May shower starting three days after surgery. Please use a clean towel to pat the incision dry following showers. Continue to use ice for pain and swelling after surgery. Do not use any lotions or creams on the incision until instructed by your surgeon.  

## 2021-10-31 NOTE — Anesthesia Procedure Notes (Signed)
Spinal  Patient location during procedure: OR Start time: 10/31/2021 11:32 AM End time: 10/31/2021 11:39 AM Reason for block: surgical anesthesia Staffing Performed: anesthesiologist  Anesthesiologist: Suzette Battiest, MD Preanesthetic Checklist Completed: patient identified, IV checked, site marked, risks and benefits discussed, surgical consent, monitors and equipment checked, pre-op evaluation and timeout performed Spinal Block Patient position: sitting Prep: DuraPrep Patient monitoring: heart rate, cardiac monitor, continuous pulse ox and blood pressure Approach: midline Location: L5-S1 Injection technique: single-shot Needle Needle type: Pencan  Needle gauge: 24 G Needle length: 9 cm Assessment Sensory level: T4 Events: CSF return Additional Notes X2 attempts at L3/4 and 4/5 without successful SAB. CSF obtained on L5/S1. Freeflowing CSF and aspiration before injection. During injection unable to push partway through. Syringe removed and obtained CSF free flowing again. Aspiration before and after injection.

## 2021-10-31 NOTE — Progress Notes (Signed)
Orthopedic Tech Progress Note Patient Details:  Navil Kole Sep 02, 1946 [id number]  CPM Left Knee CPM Left Knee: On Left Knee Flexion (Degrees): 40 Left Knee Extension (Degrees): 10  Post Interventions Patient Tolerated: Well Instructions Provided: Care of device, Adjustment of device  [place] Pink 10/31/2021, 1:12 PM

## 2021-11-01 ENCOUNTER — Encounter (HOSPITAL_COMMUNITY): Payer: Self-pay | Admitting: Orthopedic Surgery

## 2021-11-01 DIAGNOSIS — E039 Hypothyroidism, unspecified: Secondary | ICD-10-CM | POA: Diagnosis not present

## 2021-11-01 DIAGNOSIS — Z87891 Personal history of nicotine dependence: Secondary | ICD-10-CM | POA: Diagnosis not present

## 2021-11-01 DIAGNOSIS — Z7982 Long term (current) use of aspirin: Secondary | ICD-10-CM | POA: Diagnosis not present

## 2021-11-01 DIAGNOSIS — Z79899 Other long term (current) drug therapy: Secondary | ICD-10-CM | POA: Diagnosis not present

## 2021-11-01 DIAGNOSIS — N182 Chronic kidney disease, stage 2 (mild): Secondary | ICD-10-CM | POA: Diagnosis not present

## 2021-11-01 DIAGNOSIS — M1712 Unilateral primary osteoarthritis, left knee: Secondary | ICD-10-CM | POA: Diagnosis not present

## 2021-11-01 DIAGNOSIS — I129 Hypertensive chronic kidney disease with stage 1 through stage 4 chronic kidney disease, or unspecified chronic kidney disease: Secondary | ICD-10-CM | POA: Diagnosis not present

## 2021-11-01 LAB — CBC
HCT: 27.1 % — ABNORMAL LOW (ref 36.0–46.0)
Hemoglobin: 9.1 g/dL — ABNORMAL LOW (ref 12.0–15.0)
MCH: 33.2 pg (ref 26.0–34.0)
MCHC: 33.6 g/dL (ref 30.0–36.0)
MCV: 98.9 fL (ref 80.0–100.0)
Platelets: 125 10*3/uL — ABNORMAL LOW (ref 150–400)
RBC: 2.74 MIL/uL — ABNORMAL LOW (ref 3.87–5.11)
RDW: 11.6 % (ref 11.5–15.5)
WBC: 5.4 10*3/uL (ref 4.0–10.5)
nRBC: 0 % (ref 0.0–0.2)

## 2021-11-01 LAB — BASIC METABOLIC PANEL
Anion gap: 8 (ref 5–15)
BUN: 16 mg/dL (ref 8–23)
CO2: 20 mmol/L — ABNORMAL LOW (ref 22–32)
Calcium: 8.3 mg/dL — ABNORMAL LOW (ref 8.9–10.3)
Chloride: 102 mmol/L (ref 98–111)
Creatinine, Ser: 1.45 mg/dL — ABNORMAL HIGH (ref 0.44–1.00)
GFR, Estimated: 38 mL/min — ABNORMAL LOW (ref >60)
Glucose, Bld: 162 mg/dL — ABNORMAL HIGH (ref 70–99)
Potassium: 4.8 mmol/L (ref 3.5–5.1)
Sodium: 130 mmol/L — ABNORMAL LOW (ref 135–145)

## 2021-11-01 MED ORDER — ASPIRIN 325 MG PO TBEC: 325.0000 mg | DELAYED_RELEASE_TABLET | Freq: Two times a day (BID) | ORAL | 0 refills | Status: AC

## 2021-11-01 MED ORDER — METHOCARBAMOL 500 MG PO TABS: 500.0000 mg | ORAL_TABLET | Freq: Four times a day (QID) | ORAL | 0 refills | Status: DC | PRN

## 2021-11-01 MED ORDER — TRAMADOL HCL 50 MG PO TABS: 50.0000 mg | ORAL_TABLET | Freq: Four times a day (QID) | ORAL | 0 refills | Status: DC | PRN

## 2021-11-01 NOTE — Progress Notes (Signed)
Physical Therapy Treatment Patient Details Name: [name] MRN: [id number] DOB: [birth year] Today's Date: 11/01/2021   History of Present Illness Patient is 75 y.o. 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    PT Comments    Pt finishing lunch on arrival.  Pt eager to practice steps and hopefully d/c home.  Pt however, became dizzy with ambulation which worsened so instead returned to recliner.  BPs then obtained as below and RN notified.   11/01/21 1407  Vital Signs  Pulse Rate 70  Pulse Rate Source Monitor  BP (!) 102/51  BP Location Left Arm  BP Method Automatic  Oxygen Therapy  SpO2 100 %  O2 Device Room Air    11/01/21 1409  Vital Signs  Pulse Rate 67  Pulse Rate Source Monitor  BP (!) 96/52  BP Location Left Arm  BP Method Automatic  Oxygen Therapy  SpO2 100 %  O2 Device Room Air         Recommendations for follow up therapy are one component of a multi-disciplinary discharge planning process, led by the attending physician.  Recommendations may be updated based on patient status, additional functional criteria and insurance authorization.  Follow Up Recommendations  Follow physician's recommendations for discharge plan and follow up therapies     Assistance Recommended at Discharge Frequent or constant Supervision/Assistance  Equipment Recommendations  None recommended by PT    Recommendations for Other Services       Precautions / Restrictions Precautions Precautions: Fall;Knee Restrictions Other Position/Activity Restrictions: WBAT     Mobility  Bed Mobility Overal bed mobility: Needs Assistance Bed Mobility: Supine to Sit     Supine to sit: Min guard;HOB elevated     General bed mobility comments: pt in recliner    Transfers Overall transfer level: Needs assistance Equipment used: Rolling walker (2 wheels) Transfers: Sit to/from Stand Sit to Stand: Min guard            General transfer comment: verbal cues for UE and LE positioning, increased time and effort    Ambulation/Gait Ambulation/Gait assistance: Min guard;Min assist Gait Distance (Feet): 24 Feet Assistive device: Rolling walker (2 wheels) Gait Pattern/deviations: Step-to pattern;Decreased stride length;Decreased stance time - left;Antalgic Gait velocity: decr     General Gait Details: verbal cues for sequence and RW positioning, step length; pt reported dizziness which worsened so did not practice steps and returned to Dentist    Modified Rankin (Stroke Patients Only)       Balance                                            Cognition Arousal/Alertness: Awake/alert Behavior During Therapy: WFL for tasks assessed/performed Overall Cognitive Status: Within Functional Limits for tasks assessed                                          Exercises   General Comments        Pertinent Vitals/Pain Pain Assessment: Faces Faces Pain Scale: Hurts even more Pain Location: Lt knee Pain Descriptors / Indicators: Aching;Discomfort;Sore Pain Intervention(s): Monitored during session;Repositioned    Home Living  Prior Function            PT Goals (current goals can now be found in the care plan section) Progress towards PT goals: Progressing toward goals    Frequency    7X/week      PT Plan Current plan remains appropriate    Co-evaluation              AM-PAC PT "6 Clicks" Mobility   Outcome Measure  Help needed turning from your back to your side while in a flat bed without using bedrails?: A Little Help needed moving from lying on your back to sitting on the side of a flat bed without using bedrails?: A Little Help needed moving to and from a bed to a chair (including a wheelchair)?: A Little Help needed standing up from a chair using  your arms (e.g., wheelchair or bedside chair)?: A Little Help needed to walk in hospital room?: A Little Help needed climbing 3-5 steps with a railing? : A Little 6 Click Score: 18    End of Session Equipment Utilized During Treatment: Gait belt Activity Tolerance: Treatment limited secondary to medical complications (Comment) (low BP) Patient left: in chair;with call bell/phone within reach;with chair alarm set;with family/visitor present Nurse Communication: Mobility status PT Visit Diagnosis: Difficulty in walking, not elsewhere classified (R26.2);Muscle weakness (generalized) (M62.81)     Time: 7048-8891 PT Time Calculation (min) (ACUTE ONLY): 19 min  Charges:  $Gait Training: 8-22 mins                     Jannette Spanner PT, DPT Acute Rehabilitation Services Pager: [phone] Office: [place] 11/01/2021, 3:27 PM

## 2021-11-01 NOTE — Progress Notes (Signed)
Subjective: 1 Day Post-Op Procedure(s) (LRB): TOTAL KNEE ARTHROPLASTY (Left) Patient reports pain as mild.   Patient seen in rounds by Dr. Wynelle Link. Patient had issues with confusion after receiving oxycodone and tramadol. Oxy discontinued, will just use tramadol for pain control moving forward. Denies chest pain or SOB. Foley catheter removed this AM. We will continue therapy today, ambulated 44' yesterday.   Objective: Vital signs in last 24 hours: Temp:  [97.5 F (36.4 C)-98.4 F (36.9 C)] 98.3 F (36.8 C) (11/22 0447) Pulse Rate:  [62-85] 67 (11/22 0447) Resp:  [11-26] 16 (11/22 0447) BP: (105-156)/(56-97) 137/74 (11/22 0447) SpO2:  [94 %-100 %] 100 % (11/22 0447) Weight:  [53.1 kg] 53.1 kg (11/21 0956)  Intake/Output from previous day:  Intake/Output Summary (Last 24 hours) at 11/01/2021 0821 Last data filed at 11/01/2021 0535 Gross per 24 hour  Intake 1220 ml  Output 700 ml  Net 520 ml     Intake/Output this shift: No intake/output data recorded.  Labs: Recent Labs    10/31/21 1503 11/01/21 0308  HGB 11.4* 9.1*   Recent Labs    10/31/21 1503 11/01/21 0308  WBC 4.6 5.4  RBC 3.42* 2.74*  HCT 33.7* 27.1*  PLT 152 125*   Recent Labs    10/31/21 1503 11/01/21 0308  NA 132* 130*  K 4.5 4.8  CL 102 102  CO2 22 20*  BUN 14 16  CREATININE 1.37* 1.45*  GLUCOSE 163* 162*  CALCIUM 8.7* 8.3*   No results for input(s): LABPT, INR in the last 72 hours.  Exam: General - Patient is Alert and Oriented Extremity - Neurologically intact Neurovascular intact Sensation intact distally Dorsiflexion/Plantar flexion intact Dressing - dressing C/D/I Motor Function - intact, moving foot and toes well on exam.   Past Medical History:  Diagnosis Date   Acute pyelonephritis 10/15/2019   Acute renal failure superimposed on stage 2 chronic kidney disease ([place]) 10/15/2019   Alcoholism ([place]) 01/09/2012   Anxiety    Arthritis    Left knee   B12 deficiency     Carotid artery narrowing 10/03/2012   On the right.   Chronic kidney disease (CKD), stage II (mild)    Depression    Headache(784.0)    Hyperlipidemia    Hypertension    Hyponatremia 01/09/2012   Hypothyroid    Menopause    Mini stroke    Orthostatic syncope 10/02/2012   SBO (small bowel obstruction) ([place]) 06/28/2016   Stroke ([place]) 2010   no deficits   Thrombocytopenia ([place]) 10/15/2019    Assessment/Plan: 1 Day Post-Op Procedure(s) (LRB): TOTAL KNEE ARTHROPLASTY (Left) Principal Problem:   OA (osteoarthritis) of knee Active Problems:   Primary osteoarthritis of left knee  Estimated body mass index is 20.1 kg/m as calculated from the following:   Height as of this encounter: 5\' 4"  (1.626 m).   Weight as of this encounter: 53.1 kg. Advance diet Up with therapy D/C IV fluids   Patient's anticipated LOS is less than 2 midnights, meeting these requirements: - Lives within 1 hour of care - Has a competent adult at home to recover with post-op recover - NO history of  - Chronic pain requiring opioids  - Diabetes  - Coronary Artery Disease  - Heart failure  - Heart attack  - DVT/VTE  - Cardiac arrhythmia  - Respiratory Failure/COPD  - Renal failure  - Anemia  - Advanced Liver disease  DVT Prophylaxis - Aspirin Weight bearing as tolerated. Continue therapy.  Plan  is to go Home after hospital stay. Possible discharge this afternoon if pain controlled and confusion subsided. Scheduled [facility] at [facility]). Follow-up in the office in 2 weeks.  The PDMP database was reviewed today prior to any opioid medications being prescribed to this patient.  Theresa Duty, PA-C Orthopedic Surgery [phone] 11/01/2021, 8:21 AM

## 2021-11-01 NOTE — Progress Notes (Signed)
Physical Therapy Treatment Patient Details Name: [name] MRN: [id number] DOB: [birth year] Today's Date: 11/01/2021   History of Present Illness Patient is 75 y.o. 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    PT Comments    Pt ambulated in hallway and performed LE exercises.  Pt will need to practice steps prior to d/c home possibly later today.    Recommendations for follow up therapy are one component of a multi-disciplinary discharge planning process, led by the attending physician.  Recommendations may be updated based on patient status, additional functional criteria and insurance authorization.  Follow Up Recommendations  Follow physician's recommendations for discharge plan and follow up therapies     Assistance Recommended at Discharge Frequent or constant Supervision/Assistance  Equipment Recommendations  None recommended by PT    Recommendations for Other Services       Precautions / Restrictions Precautions Precautions: Fall;Knee Restrictions Other Position/Activity Restrictions: WBAT     Mobility  Bed Mobility Overal bed mobility: Needs Assistance Bed Mobility: Supine to Sit     Supine to sit: Min guard;HOB elevated     General bed mobility comments: increased time and effort    Transfers Overall transfer level: Needs assistance Equipment used: Rolling walker (2 wheels) Transfers: Sit to/from Stand Sit to Stand: Min guard           General transfer comment: verbal cues for UE and LE positioning    Ambulation/Gait Ambulation/Gait assistance: Min guard Gait Distance (Feet): 80 Feet Assistive device: Rolling walker (2 wheels) Gait Pattern/deviations: Step-to pattern;Decreased stride length;Decreased stance time - left;Antalgic Gait velocity: decr     General Gait Details: verbal cues for sequence and RW positioning, step length   Stairs             Wheelchair  Mobility    Modified Rankin (Stroke Patients Only)       Balance                                            Cognition Arousal/Alertness: Awake/alert Behavior During Therapy: WFL for tasks assessed/performed Overall Cognitive Status: Within Functional Limits for tasks assessed                                          Exercises Total Joint Exercises Ankle Circles/Pumps: AROM;Both;10 reps Quad Sets: AROM;Both;10 reps Short Arc Quad: AROM;Left;10 reps Heel Slides: Seated;AROM;Left;10 reps Hip ABduction/ADduction: AROM;Left;10 reps Straight Leg Raises: AROM;Left;10 reps    General Comments        Pertinent Vitals/Pain Pain Assessment: Faces Faces Pain Scale: Hurts even more Pain Location: Lt knee Pain Descriptors / Indicators: Aching;Discomfort;Sore Pain Intervention(s): Repositioned;Monitored during session;Patient requesting pain meds-RN notified    Home Living                          Prior Function            PT Goals (current goals can now be found in the care plan section) Progress towards PT goals: Progressing toward goals    Frequency    7X/week      PT Plan Current plan remains appropriate    Co-evaluation  AM-PAC PT "6 Clicks" Mobility   Outcome Measure  Help needed turning from your back to your side while in a flat bed without using bedrails?: A Little Help needed moving from lying on your back to sitting on the side of a flat bed without using bedrails?: A Little Help needed moving to and from a bed to a chair (including a wheelchair)?: A Little Help needed standing up from a chair using your arms (e.g., wheelchair or bedside chair)?: A Little Help needed to walk in hospital room?: A Little Help needed climbing 3-5 steps with a railing? : A Little 6 Click Score: 18    End of Session Equipment Utilized During Treatment: Gait belt Activity Tolerance: Patient tolerated  treatment well Patient left: in chair;with call bell/phone within reach;with chair alarm set;with family/visitor present Nurse Communication: Mobility status PT Visit Diagnosis: Difficulty in walking, not elsewhere classified (R26.2);Muscle weakness (generalized) (M62.81)     Time: 9381-8299 PT Time Calculation (min) (ACUTE ONLY): 20 min  Charges:  $Therapeutic Exercise: 8-22 mins                    Jannette Spanner PT, DPT Acute Rehabilitation Services Pager: [phone] Office: [place] 11/01/2021, 12:16 PM

## 2021-11-01 NOTE — Telephone Encounter (Signed)
Message is two weeks old, patient never returned call.

## 2021-11-01 NOTE — TOC Transition Note (Signed)
Transition of Care [facility]) - CM/SW Discharge Note  Patient Details  Name: [name] MRN: [id number] Date of Birth: 1946-04-05  [facility] CM/SW Contact:  Sherie Don, LCSW Phone Number: 11/01/2021, 11:17 AM  Clinical Narrative: Patient is expected to discharge home after working with PT. CSW met with patient to confirm discharge plan. Patient will discharge home with OPPT at [facility] in [place]. Patient has a rolling walker at home and declined the 3N1. TOC signing off.  Final next level of care: OP Rehab Barriers to Discharge: No Barriers Identified  Patient Goals and CMS Choice Patient states their goals for this hospitalization and ongoing recovery are:: Discharge home with OPPT Choice offered to / list presented to : NA  Discharge Plan and Services        DME Arranged: N/A DME Agency: NA HH Arranged: NA HH Agency: NA  Readmission Risk Interventions No flowsheet data found.

## 2021-11-01 NOTE — Progress Notes (Signed)
Patient experienced slight confusion with administration of oxycodone and ultram.

## 2021-11-02 ENCOUNTER — Ambulatory Visit: Payer: Medicare HMO | Attending: Orthopedic Surgery | Admitting: Physical Therapy

## 2021-11-02 DIAGNOSIS — Z87891 Personal history of nicotine dependence: Secondary | ICD-10-CM | POA: Diagnosis not present

## 2021-11-02 DIAGNOSIS — Z7982 Long term (current) use of aspirin: Secondary | ICD-10-CM | POA: Diagnosis not present

## 2021-11-02 DIAGNOSIS — Z79899 Other long term (current) drug therapy: Secondary | ICD-10-CM | POA: Diagnosis not present

## 2021-11-02 DIAGNOSIS — N182 Chronic kidney disease, stage 2 (mild): Secondary | ICD-10-CM | POA: Diagnosis not present

## 2021-11-02 DIAGNOSIS — M1712 Unilateral primary osteoarthritis, left knee: Secondary | ICD-10-CM | POA: Diagnosis not present

## 2021-11-02 DIAGNOSIS — E039 Hypothyroidism, unspecified: Secondary | ICD-10-CM | POA: Diagnosis not present

## 2021-11-02 DIAGNOSIS — I129 Hypertensive chronic kidney disease with stage 1 through stage 4 chronic kidney disease, or unspecified chronic kidney disease: Secondary | ICD-10-CM | POA: Diagnosis not present

## 2021-11-02 LAB — CBC
HCT: 28.8 % — ABNORMAL LOW (ref 36.0–46.0)
Hemoglobin: 9.7 g/dL — ABNORMAL LOW (ref 12.0–15.0)
MCH: 33.7 pg (ref 26.0–34.0)
MCHC: 33.7 g/dL (ref 30.0–36.0)
MCV: 100 fL (ref 80.0–100.0)
Platelets: 179 10*3/uL (ref 150–400)
RBC: 2.88 MIL/uL — ABNORMAL LOW (ref 3.87–5.11)
RDW: 11.8 % (ref 11.5–15.5)
WBC: 7.3 10*3/uL (ref 4.0–10.5)
nRBC: 0 % (ref 0.0–0.2)

## 2021-11-02 LAB — BASIC METABOLIC PANEL
Anion gap: 9 (ref 5–15)
BUN: 20 mg/dL (ref 8–23)
CO2: 20 mmol/L — ABNORMAL LOW (ref 22–32)
Calcium: 8.7 mg/dL — ABNORMAL LOW (ref 8.9–10.3)
Chloride: 100 mmol/L (ref 98–111)
Creatinine, Ser: 1.38 mg/dL — ABNORMAL HIGH (ref 0.44–1.00)
GFR, Estimated: 40 mL/min — ABNORMAL LOW (ref >60)
Glucose, Bld: 125 mg/dL — ABNORMAL HIGH (ref 70–99)
Potassium: 5 mmol/L (ref 3.5–5.1)
Sodium: 129 mmol/L — ABNORMAL LOW (ref 135–145)

## 2021-11-02 MED ORDER — SODIUM CHLORIDE 0.9 % IV BOLUS
500.0000 mL | Freq: Once | INTRAVENOUS | Status: AC
Start: 2021-11-02 — End: 2021-11-02
  Administered 2021-11-02: 500 mL via INTRAVENOUS

## 2021-11-02 NOTE — Progress Notes (Signed)
Physical Therapy Treatment Patient Details Name: [name] MRN: [id number] DOB: 1945-12-25 Today's Date: 11/02/2021   History of Present Illness Patient is 75 y.o. 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    PT Comments    Pt eager for d/c home today.  Pt able to ambulate in hallway and perform steps.  Pt denies dizziness.  Pt has HEP handout and gait belt for home.  Pt had no further questions.    Recommendations for follow up therapy are one component of a multi-disciplinary discharge planning process, led by the attending physician.  Recommendations may be updated based on patient status, additional functional criteria and insurance authorization.  Follow Up Recommendations  Follow physician's recommendations for discharge plan and follow up therapies     Assistance Recommended at Discharge Frequent or constant Supervision/Assistance  Equipment Recommendations  None recommended by PT    Recommendations for Other Services       Precautions / Restrictions Precautions Precautions: Fall;Knee Restrictions Other Position/Activity Restrictions: WBAT     Mobility  Bed Mobility               General bed mobility comments: pt sitting EOB on arrival    Transfers Overall transfer level: Needs assistance Equipment used: Rolling walker (2 wheels) Transfers: Sit to/from Stand Sit to Stand: Min guard           General transfer comment: verbal cues for UE and LE positioning, increased time and effort    Ambulation/Gait Ambulation/Gait assistance: Min guard Gait Distance (Feet): 80 Feet Assistive device: Rolling walker (2 wheels) Gait Pattern/deviations: Step-to pattern;Decreased stride length;Decreased stance time - left;Antalgic       General Gait Details: verbal cues for sequence and RW positioning, step length; pt denies dizziness   Stairs Stairs: Yes Stairs assistance: Min guard Stair  Management: Step to pattern;Forwards;Two rails Number of Stairs: 2 General stair comments: pt performed twice, pt able to recall correct sequencing, pt feels confident she will be able to perform once home as well, no dizziness   Wheelchair Mobility    Modified Rankin (Stroke Patients Only)       Balance                                            Cognition Arousal/Alertness: Awake/alert Behavior During Therapy: WFL for tasks assessed/performed Overall Cognitive Status: Within Functional Limits for tasks assessed                                          Exercises      General Comments        Pertinent Vitals/Pain Pain Assessment: Faces Faces Pain Scale: Hurts little more Pain Location: Lt knee Pain Descriptors / Indicators: Aching;Discomfort;Sore Pain Intervention(s): Repositioned;Monitored during session    Home Living                          Prior Function            PT Goals (current goals can now be found in the care plan section) Progress towards PT goals: Progressing toward goals    Frequency    7X/week      PT Plan Current plan  remains appropriate    Co-evaluation              AM-PAC PT "6 Clicks" Mobility   Outcome Measure  Help needed turning from your back to your side while in a flat bed without using bedrails?: A Little Help needed moving from lying on your back to sitting on the side of a flat bed without using bedrails?: A Little Help needed moving to and from a bed to a chair (including a wheelchair)?: A Little Help needed standing up from a chair using your arms (e.g., wheelchair or bedside chair)?: A Little Help needed to walk in hospital room?: A Little Help needed climbing 3-5 steps with a railing? : A Little 6 Click Score: 18    End of Session Equipment Utilized During Treatment: Gait belt Activity Tolerance: Patient tolerated treatment well Patient left: in bed;with call  bell/phone within reach;with bed alarm set;with family/visitor present Nurse Communication: Mobility status PT Visit Diagnosis: Difficulty in walking, not elsewhere classified (R26.2);Muscle weakness (generalized) (M62.81)     Time: 1117-3567 PT Time Calculation (min) (ACUTE ONLY): 12 min  Charges:  $Gait Training: 8-22 mins                    Jannette Spanner PT, DPT Acute Rehabilitation Services Pager: [phone] Office: [place] 11/02/2021, 4:38 PM

## 2021-11-02 NOTE — Progress Notes (Signed)
   Subjective: 2 Days Post-Op Procedure(s) (LRB): TOTAL KNEE ARTHROPLASTY (Left) Patient reports pain as mild.   Patient seen in rounds by Dr. Wynelle Link. Patient had issues with dizziness yesterday during physical therapy. IV fluids restarted, BP still soft this AM. 500 mL bolus ordered. Denies chest pain or SOB.  Plan is to go Home after hospital stay.  Objective: Vital signs in last 24 hours: Temp:  [97.5 F (36.4 C)-97.7 F (36.5 C)] 97.7 F (36.5 C) (11/23 0546) Pulse Rate:  [65-70] 70 (11/23 0546) Resp:  [14-16] 14 (11/23 0546) BP: (96-112)/(51-71) 97/58 (11/23 0546) SpO2:  [100 %] 100 % (11/23 0546)  Intake/Output from previous day:  Intake/Output Summary (Last 24 hours) at 11/02/2021 0719 Last data filed at 11/02/2021 0600 Gross per 24 hour  Intake 480 ml  Output --  Net 480 ml    Intake/Output this shift: No intake/output data recorded.  Labs: Recent Labs    10/31/21 1503 11/01/21 0308 11/02/21 0319  HGB 11.4* 9.1* 9.7*   Recent Labs    11/01/21 0308 11/02/21 0319  WBC 5.4 7.3  RBC 2.74* 2.88*  HCT 27.1* 28.8*  PLT 125* 179   Recent Labs    11/01/21 0308 11/02/21 0319  NA 130* 129*  K 4.8 5.0  CL 102 100  CO2 20* 20*  BUN 16 20  CREATININE 1.45* 1.38*  GLUCOSE 162* 125*  CALCIUM 8.3* 8.7*   No results for input(s): LABPT, INR in the last 72 hours.  Exam: General - Patient is Alert and Oriented Extremity - Neurologically intact Neurovascular intact Sensation intact distally Dorsiflexion/Plantar flexion intact Dressing/Incision - clean, dry, no drainage Motor Function - intact, moving foot and toes well on exam.   Past Medical History:  Diagnosis Date   Acute pyelonephritis 10/15/2019   Acute renal failure superimposed on stage 2 chronic kidney disease ([place]) 10/15/2019   Alcoholism ([place]) 01/09/2012   Anxiety    Arthritis    Left knee   B12 deficiency    Carotid artery narrowing 10/03/2012   On the right.   Chronic kidney disease  (CKD), stage II (mild)    Depression    Headache(784.0)    Hyperlipidemia    Hypertension    Hyponatremia 01/09/2012   Hypothyroid    Menopause    Mini stroke    Orthostatic syncope 10/02/2012   SBO (small bowel obstruction) ([place]) 06/28/2016   Stroke ([place]) 2010   no deficits   Thrombocytopenia ([facility]) 10/15/2019    Assessment/Plan: 2 Days Post-Op Procedure(s) (LRB): TOTAL KNEE ARTHROPLASTY (Left) Principal Problem:   OA (osteoarthritis) of knee Active Problems:   Primary osteoarthritis of left knee  Estimated body mass index is 20.1 kg/m as calculated from the following:   Height as of this encounter: 5\' 4"  (1.626 m).   Weight as of this encounter: 53.1 kg. Up with therapy  DVT Prophylaxis - Aspirin Weight-bearing as tolerated  Discharge to home with OPPT if meeting goals with therapy and dizziness improved. Follow-up in 2 weeks.  Theresa Duty, PA-C Orthopedic Surgery [phone] 11/02/2021, 7:19 AM

## 2021-11-02 NOTE — Plan of Care (Signed)
Pt ready to DC home with husband 

## 2021-11-15 ENCOUNTER — Ambulatory Visit: Payer: Medicare HMO

## 2021-11-16 NOTE — Discharge Summary (Signed)
Physician Discharge Summary   Patient ID: [name] MRN: [id number] DOB/AGE: July 02, 1946 75 y.o.  Admit date: 10/31/2021 Discharge date: 11/02/2021  Primary Diagnosis: Osteoarthritis, left knee   Admission Diagnoses:  Past Medical History:  Diagnosis Date   Acute pyelonephritis 10/15/2019   Acute renal failure superimposed on stage 2 chronic kidney disease ([place]) 10/15/2019   Alcoholism ([place]) 01/09/2012   Anxiety    Arthritis    Left knee   B12 deficiency    Carotid artery narrowing 10/03/2012   On the right.   Chronic kidney disease (CKD), stage II (mild)    Depression    Headache(784.0)    Hyperlipidemia    Hypertension    Hyponatremia 01/09/2012   Hypothyroid    Menopause    Mini stroke    Orthostatic syncope 10/02/2012   SBO (small bowel obstruction) ([place]) 06/28/2016   Stroke ([place]) 2010   no deficits   Thrombocytopenia ([place]) 10/15/2019   Discharge Diagnoses:   Principal Problem:   OA (osteoarthritis) of knee Active Problems:   Primary osteoarthritis of left knee  Estimated body mass index is 20.1 kg/m as calculated from the following:   Height as of this encounter: _0  (1.626 m).   Weight as of this encounter: 53.1 kg.  Procedure:  Procedure(s) (LRB): TOTAL KNEE ARTHROPLASTY (Left)   Consults: None  HPI: [name] is a 75 y.o. year old female with end stage OA of her left knee with progressively worsening pain and dysfunction. She has constant pain, with activity and at rest and significant functional deficits with difficulties even with ADLs. She has had extensive non-op management including analgesics, injections of cortisone and home exercise program, but remains in significant pain with significant dysfunction. Radiographs show bone on bone arthritis lateral and patellofemoral. She presents now for left Total Knee Arthroplasty.     Laboratory Data: Admission on 10/31/2021, Discharged on 11/02/2021  Component Date Value Ref Range  Status   Sodium 10/31/2021 132 (L)  135 - 145 mmol/L Final   Potassium 10/31/2021 4.5  3.5 - 5.1 mmol/L Final   Chloride 10/31/2021 102  98 - 111 mmol/L Final   CO2 10/31/2021 22  22 - 32 mmol/L Final   Glucose, Bld 10/31/2021 163 (H)  70 - 99 mg/dL Final   Glucose reference range applies only to samples taken after fasting for at least 8 hours.   BUN 10/31/2021 14  8 - 23 mg/dL Final   Creatinine, Ser 10/31/2021 1.37 (H)  0.44 - 1.00 mg/dL Final   Calcium 10/31/2021 8.7 (L)  8.9 - 10.3 mg/dL Final   Total Protein 10/31/2021 6.1 (L)  6.5 - 8.1 g/dL Final   Albumin 10/31/2021 3.5  3.5 - 5.0 g/dL Final   AST 10/31/2021 21  15 - 41 U/L Final   ALT 10/31/2021 16  0 - 44 U/L Final   Alkaline Phosphatase 10/31/2021 74  38 - 126 U/L Final   Total Bilirubin 10/31/2021 0.6  0.3 - 1.2 mg/dL Final   GFR, Estimated 10/31/2021 41 (L)  >60 mL/min Final   Comment: (NOTE) Calculated using the CKD-EPI Creatinine Equation (2021)    Anion gap 10/31/2021 8  5 - 15 Final   Performed at [facility], [place] [address] [place], [place] [address]   WBC 10/31/2021 4.6  4.0 - 10.5 K/uL Final   RBC 10/31/2021 3.42 (L)  3.87 - 5.11 MIL/uL Final   Hemoglobin 10/31/2021 11.4 (L)  12.0 - 15.0 g/dL Final  HCT 10/31/2021 33.7 (L)  36.0 - 46.0 % Final   MCV 10/31/2021 98.5  80.0 - 100.0 fL Final   MCH 10/31/2021 33.3  26.0 - 34.0 pg Final   MCHC 10/31/2021 33.8  30.0 - 36.0 g/dL Final   RDW 10/31/2021 11.9  11.5 - 15.5 % Final   Platelets 10/31/2021 152  150 - 400 K/uL Final   nRBC 10/31/2021 0.0  0.0 - 0.2 % Final   Performed at [facility], [place] [address], [place], [place] [address]   WBC 11/01/2021 5.4  4.0 - 10.5 K/uL Final   RBC 11/01/2021 2.74 (L)  3.87 - 5.11 MIL/uL Final   Hemoglobin 11/01/2021 9.1 (L)  12.0 - 15.0 g/dL Final   HCT 11/01/2021 27.1 (L)  36.0 - 46.0 % Final   MCV 11/01/2021 98.9  80.0 - 100.0 fL Final   MCH 11/01/2021 33.2  26.0 - 34.0 pg Final   MCHC  11/01/2021 33.6  30.0 - 36.0 g/dL Final   RDW 11/01/2021 11.6  11.5 - 15.5 % Final   Platelets 11/01/2021 125 (L)  150 - 400 K/uL Final   Comment: Immature Platelet Fraction may be clinically indicated, consider ordering this additional test [id number] REPEATED TO VERIFY    nRBC 11/01/2021 0.0  0.0 - 0.2 % Final   Performed at [facility], [place] [address] [place], [place] [address]   Sodium 11/01/2021 130 (L)  135 - 145 mmol/L Final   Potassium 11/01/2021 4.8  3.5 - 5.1 mmol/L Final   Chloride 11/01/2021 102  98 - 111 mmol/L Final   CO2 11/01/2021 20 (L)  22 - 32 mmol/L Final   Glucose, Bld 11/01/2021 162 (H)  70 - 99 mg/dL Final   Glucose reference range applies only to samples taken after fasting for at least 8 hours.   BUN 11/01/2021 16  8 - 23 mg/dL Final   Creatinine, Ser 11/01/2021 1.45 (H)  0.44 - 1.00 mg/dL Final   Calcium 11/01/2021 8.3 (L)  8.9 - 10.3 mg/dL Final   GFR, Estimated 11/01/2021 38 (L)  >60 mL/min Final   Comment: (NOTE) Calculated using the CKD-EPI Creatinine Equation (2021)    Anion gap 11/01/2021 8  5 - 15 Final   Performed at [facility], [place] [address], [place], [place] [address]   WBC 11/02/2021 7.3  4.0 - 10.5 K/uL Final   RBC 11/02/2021 2.88 (L)  3.87 - 5.11 MIL/uL Final   Hemoglobin 11/02/2021 9.7 (L)  12.0 - 15.0 g/dL Final   HCT 11/02/2021 28.8 (L)  36.0 - 46.0 % Final   MCV 11/02/2021 100.0  80.0 - 100.0 fL Final   MCH 11/02/2021 33.7  26.0 - 34.0 pg Final   MCHC 11/02/2021 33.7  30.0 - 36.0 g/dL Final   RDW 11/02/2021 11.8  11.5 - 15.5 % Final   Platelets 11/02/2021 179  150 - 400 K/uL Final   nRBC 11/02/2021 0.0  0.0 - 0.2 % Final   Performed at [facility], [place] [address], [place], [place] [address]   Sodium 11/02/2021 129 (L)  135 - 145 mmol/L Final   Potassium 11/02/2021 5.0  3.5 - 5.1 mmol/L Final   Chloride 11/02/2021 100  98 - 111 mmol/L Final   CO2 11/02/2021 20 (L)  22 - 32  mmol/L Final   Glucose, Bld 11/02/2021 125 (H)  70 - 99 mg/dL Final   Glucose reference range applies only to samples taken  after fasting for at least 8 hours.   BUN 11/02/2021 20  8 - 23 mg/dL Final   Creatinine, Ser 11/02/2021 1.38 (H)  0.44 - 1.00 mg/dL Final   Calcium 11/02/2021 8.7 (L)  8.9 - 10.3 mg/dL Final   GFR, Estimated 11/02/2021 40 (L)  >60 mL/min Final   Comment: (NOTE) Calculated using the CKD-EPI Creatinine Equation (2021)    Anion gap 11/02/2021 9  5 - 15 Final   Performed at [facility], [place] [address], [place], [place] [address]  Hospital Outpatient Visit on 10/27/2021  Component Date Value Ref Range Status   MRSA, PCR 10/27/2021 NEGATIVE  NEGATIVE Final   Staphylococcus aureus 10/27/2021 NEGATIVE  NEGATIVE Final   Comment: (NOTE) The Xpert SA Assay (FDA approved for NASAL specimens in patients 96 years of age and older), is one component of a comprehensive surveillance program. It is not intended to diagnose infection nor to guide or monitor treatment. Performed at [facility], [place] [address], [place], [place] [address]    WBC 10/27/2021 9.5  4.0 - 10.5 K/uL Final   RBC 10/27/2021 3.92  3.87 - 5.11 MIL/uL Final   Hemoglobin 10/27/2021 12.9  12.0 - 15.0 g/dL Final   HCT 10/27/2021 38.7  36.0 - 46.0 % Final   MCV 10/27/2021 98.7  80.0 - 100.0 fL Final   MCH 10/27/2021 32.9  26.0 - 34.0 pg Final   MCHC 10/27/2021 33.3  30.0 - 36.0 g/dL Final   RDW 10/27/2021 12.0  11.5 - 15.5 % Final   Platelets 10/27/2021 221  150 - 400 K/uL Final   nRBC 10/27/2021 0.0  0.0 - 0.2 % Final   Performed at [facility], [place] [address] [place], [place] [address]   Sodium 10/27/2021 133 (L)  135 - 145 mmol/L Final   Potassium 10/27/2021 5.3 (H)  3.5 - 5.1 mmol/L Final   Chloride 10/27/2021 101  98 - 111 mmol/L Final   CO2 10/27/2021 21 (L)  22 - 32 mmol/L Final   Glucose, Bld 10/27/2021 80  70 - 99 mg/dL Final   Glucose  reference range applies only to samples taken after fasting for at least 8 hours.   BUN 10/27/2021 15  8 - 23 mg/dL Final   Creatinine, Ser 10/27/2021 1.70 (H)  0.44 - 1.00 mg/dL Final   Calcium 10/27/2021 9.3  8.9 - 10.3 mg/dL Final   Total Protein 10/27/2021 6.7  6.5 - 8.1 g/dL Final   Albumin 10/27/2021 4.0  3.5 - 5.0 g/dL Final   AST 10/27/2021 25  15 - 41 U/L Final   ALT 10/27/2021 13  0 - 44 U/L Final   Alkaline Phosphatase 10/27/2021 83  38 - 126 U/L Final   Total Bilirubin 10/27/2021 0.9  0.3 - 1.2 mg/dL Final   GFR, Estimated 10/27/2021 31 (L)  >60 mL/min Final   Comment: (NOTE) Calculated using the CKD-EPI Creatinine Equation (2021)    Anion gap 10/27/2021 11  5 - 15 Final   Performed at [facility], [place] [address], [place], [place] [address]   ABO/RH(D) 10/27/2021 O POS   Final   Antibody Screen 10/27/2021 NEG   Final   Sample Expiration 10/27/2021 11/03/2021,2359   Final   Extend sample reason 10/27/2021    Final                   Value:NO TRANSFUSIONS OR PREGNANCY IN THE PAST 3 MONTHS Performed at  [facility], [place] [address], [place], [place] [address]    Prothrombin Time 10/27/2021 12.8  11.4 - 15.2 seconds Final   INR 10/27/2021 1.0  0.8 - 1.2 Final   Comment: (NOTE) INR goal varies based on device and disease states. Performed at [facility], [place] [address] [place], [place] [address]   Orders Only on 10/27/2021  Component Date Value Ref Range Status   SARS Coronavirus 2 10/27/2021 RESULT: NEGATIVE   Final   Comment: RESULT: NEGATIVESARS-CoV-2 INTERPRETATION:A NEGATIVE  test result means that SARS-CoV-2 RNA was not present in the specimen above the limit of detection of this test. This does not preclude a possible SARS-CoV-2 infection and should not be used as the  sole basis for patient management decisions. Negative results must be combined with clinical observations, patient history, and epidemiological  information. Optimum specimen types and timing for peak viral levels during infections caused by SARS-CoV-2  have not been determined. Collection of multiple specimens or types of specimens may be necessary to detect virus. Improper specimen collection and handling, sequence variability under primers/probes, or organism present below the limit of detection may  lead to false negative results. Positive and negative predictive values of testing are highly dependent on prevalence. False negative test results are more likely when prevalence of disease is high.The expected result is NEGATIVE.Fact S                          heet for  Healthcare Providers: LocalChronicle.no Sheet for Patients: SalonLookup.es Reference Range - Negative   Office Visit on 09/21/2021  Component Date Value Ref Range Status   WBC 09/21/2021 9.5  3.4 - 10.8 x10E3/uL Final   RBC 09/21/2021 3.98  3.77 - 5.28 x10E6/uL Final   Hemoglobin 09/21/2021 13.1  11.1 - 15.9 g/dL Final   Hematocrit 09/21/2021 38.2  34.0 - 46.6 % Final   MCV 09/21/2021 96  79 - 97 fL Final   MCH 09/21/2021 32.9  26.6 - 33.0 pg Final   MCHC 09/21/2021 34.3  31.5 - 35.7 g/dL Final   RDW 09/21/2021 12.2  11.7 - 15.4 % Final   Platelets 09/21/2021 203  150 - 450 x10E3/uL Final   Neutrophils 09/21/2021 62  Not Estab. % Final   Lymphs 09/21/2021 28  Not Estab. % Final   Monocytes 09/21/2021 10  Not Estab. % Final   Eos 09/21/2021 0  Not Estab. % Final   Basos 09/21/2021 0  Not Estab. % Final   Neutrophils Absolute 09/21/2021 5.8  1.4 - 7.0 x10E3/uL Final   Lymphocytes Absolute 09/21/2021 2.7  0.7 - 3.1 x10E3/uL Final   Monocytes Absolute 09/21/2021 0.9  0.1 - 0.9 x10E3/uL Final   EOS (ABSOLUTE) 09/21/2021 0.0  0.0 - 0.4 x10E3/uL Final   Basophils Absolute 09/21/2021 0.0  0.0 - 0.2 x10E3/uL Final   Immature Granulocytes 09/21/2021 0  Not Estab. % Final   Immature Grans (Abs) 09/21/2021 0.0  0.0 -  0.1 x10E3/uL Final   Glucose 09/21/2021 94  70 - 99 mg/dL Final                 **Please note reference interval change**   BUN 09/21/2021 9  8 - 27 mg/dL Final   Creatinine, Ser 09/21/2021 1.34 (H)  0.57 - 1.00 mg/dL Final   eGFR 09/21/2021 42 (L)  >59 mL/min/1.73 Final   BUN/Creatinine Ratio 09/21/2021 7 (L)  12 - 28 Final  Sodium 09/21/2021 139  134 - 144 mmol/L Final   Potassium 09/21/2021 4.5  3.5 - 5.2 mmol/L Final   Chloride 09/21/2021 100  96 - 106 mmol/L Final   CO2 09/21/2021 24  20 - 29 mmol/L Final   Calcium 09/21/2021 9.0  8.7 - 10.3 mg/dL Final   Total Protein 09/21/2021 6.4  6.0 - 8.5 g/dL Final   Albumin 09/21/2021 4.0  3.7 - 4.7 g/dL Final   Globulin, Total 09/21/2021 2.4  1.5 - 4.5 g/dL Final   Albumin/Globulin Ratio 09/21/2021 1.7  1.2 - 2.2 Final   Bilirubin Total 09/21/2021 0.7  0.0 - 1.2 mg/dL Final   Alkaline Phosphatase 09/21/2021 160 (H)  44 - 121 IU/L Final   AST 09/21/2021 64 (H)  0 - 40 IU/L Final   ALT 09/21/2021 37 (H)  0 - 32 IU/L Final   Cholesterol, Total 09/21/2021 145  100 - 199 mg/dL Final   Triglycerides 09/21/2021 354 (H)  0 - 149 mg/dL Final   HDL 09/21/2021 69  >39 mg/dL Final   VLDL Cholesterol Cal 09/21/2021 50 (H)  5 - 40 mg/dL Final   LDL Chol Calc (NIH) 09/21/2021 26  0 - 99 mg/dL Final   Chol/HDL Ratio 09/21/2021 2.1  0.0 - 4.4 ratio Final   Comment:                                   T. Chol/HDL Ratio                                             Men  Women                               1/2 Avg.Risk  3.4    3.3                                   Avg.Risk  5.0    4.4                                2X Avg.Risk  9.6    7.1                                3X Avg.Risk 23.4   11.0      X-Rays:No results found.  EKG: Orders placed or performed during the hospital encounter of 10/27/21   EKG 12-Lead   EKG 12-Lead     Hospital Course: [name] is a 75 y.o. who was admitted to [facility]. They were brought to the  operating room on 10/31/2021 and underwent Procedure(s): TOTAL KNEE ARTHROPLASTY.  Patient tolerated the procedure well and was later transferred to the recovery room and then to the orthopaedic floor for postoperative care. They were given PO and IV analgesics for pain control following their surgery. They were given 24 hours of postoperative antibiotics of  Anti-infectives (From admission, onward)    Start     Dose/Rate Route Frequency Ordered Stop   10/31/21 1800  ceFAZolin (ANCEF) IVPB 2g/100 mL  premix        2 g 200 mL/hr over 30 Minutes Intravenous Every 6 hours 10/31/21 1455 11/01/21 0022   10/31/21 0930  ceFAZolin (ANCEF) IVPB 2g/100 mL premix        2 g 200 mL/hr over 30 Minutes Intravenous On call to O.R. 10/31/21 7209 10/31/21 1200      and started on DVT prophylaxis in the form of Aspirin.   PT and OT were ordered for total joint protocol. Discharge planning consulted to help with postop disposition and equipment needs. Patient had a good night on the evening of surgery. They started to get up OOB with therapy on POD #0. Had issues with dizziness while working with PT, IV fluids restarted and 500 mL bolus given with improvement and no further issues. Continued to work with therapy into POD #2. Pt was seen during rounds on day two and was ready to go home pending progress with therapy. Dressing was changed and the incision was clean, dry and intact. Pt worked with therapy for one additional session and was meeting their goals. She was discharged to home later that day in stable condition.  Diet: Cardiac diet Activity: WBAT Follow-up: in 2 weeks Disposition: Home with OPPT Discharged Condition: stable   Discharge Instructions     Call MD / Call 911   Complete by: As directed    If you experience chest pain or shortness of breath, CALL 911 and be transported to the hospital emergency room.  If you develope a fever above 101 F, pus (white drainage) or increased drainage or redness  at the wound, or calf pain, call your surgeon's office.   Change dressing   Complete by: As directed    You may remove the bulky bandage (ACE wrap and gauze) two days after surgery. You will have an adhesive waterproof bandage underneath. Leave this in place until your first follow-up appointment.   Constipation Prevention   Complete by: As directed    Drink plenty of fluids.  Prune juice may be helpful.  You may use a stool softener, such as Colace (over the counter) 100 mg twice a day.  Use MiraLax (over the counter) for constipation as needed.   Diet - low sodium heart healthy   Complete by: As directed    Do not put a pillow under the knee. Place it under the heel.   Complete by: As directed    Driving restrictions   Complete by: As directed    No driving for two weeks   Post-operative opioid taper instructions:   Complete by: As directed    POST-OPERATIVE OPIOID TAPER INSTRUCTIONS: It is important to wean off of your opioid medication as soon as possible. If you do not need pain medication after your surgery it is ok to stop day one. Opioids include: Codeine, Hydrocodone(Norco, Vicodin), Oxycodone(Percocet, oxycontin) and hydromorphone amongst others.  Long term and even short term use of opiods can cause: Increased pain response Dependence Constipation Depression Respiratory depression And more.  Withdrawal symptoms can include Flu like symptoms Nausea, vomiting And more Techniques to manage these symptoms Hydrate well Eat regular healthy meals Stay active Use relaxation techniques(deep breathing, meditating, yoga) Do Not substitute Alcohol to help with tapering If you have been on opioids for less than two weeks and do not have pain than it is ok to stop all together.  Plan to wean off of opioids This plan should start within one week post op of your joint replacement.  Maintain the same interval or time between taking each dose and first decrease the dose.  Cut the  total daily intake of opioids by one tablet each day Next start to increase the time between doses. The last dose that should be eliminated is the evening dose.      TED hose   Complete by: As directed    Use stockings (TED hose) for three weeks on both leg(s).  You may remove them at night for sleeping.   Weight bearing as tolerated   Complete by: As directed       Allergies as of 11/02/2021       Reactions   Ciprofloxacin Other (See Comments)   Caused pt to have delusional thoughts and hallucinations   Procardia [nifedipine] Swelling   Edema    Norvasc [amlodipine Besylate] Hives        Medication List     STOP taking these medications    fexofenadine-pseudoephedrine 180-240 MG 24 hr tablet Commonly known as: ALLEGRA-D 24   thiamine 100 MG tablet Commonly known as: Vitamin B-1       TAKE these medications    alendronate 70 MG tablet Commonly known as: FOSAMAX Take 1 tablet (70 mg total) by mouth once a week. Take with a full glass of water on an empty stomach.   aspirin 325 MG EC tablet Take 1 tablet (325 mg total) by mouth 2 (two) times daily for 20 days. Then resume one 81 mg aspirin once a day. What changed:  medication strength how much to take when to take this additional instructions   fluconazole 100 MG tablet Commonly known as: Diflucan Take two with first dose. Then starting the next day take one daily until all are taken.   fluticasone 50 MCG/ACT nasal spray Commonly known as: FLONASE Place 2 sprays into both nostrils daily. What changed:  when to take this reasons to take this   folic acid 644 MCG tablet Commonly known as: FOLVITE Take 2,400 mcg by mouth daily.   levothyroxine 100 MCG tablet Commonly known as: SYNTHROID Take 1 tablet (100 mcg total) by mouth daily.   methocarbamol 500 MG tablet Commonly known as: ROBAXIN Take 1 tablet (500 mg total) by mouth every 6 (six) hours as needed for muscle spasms.   metoprolol succinate  100 MG 24 hr tablet Commonly known as: TOPROL-XL Take 1 tablet (100 mg total) by mouth daily. For blood pressure control   olmesartan 20 MG tablet Commonly known as: BENICAR Take 1 tablet by mouth once daily   pregabalin 200 MG capsule Commonly known as: Lyrica Take 1 capsule (200 mg total) by mouth daily.   rosuvastatin 10 MG tablet Commonly known as: CRESTOR TAKE 1 TABLET EVERY DAY   traMADol 50 MG tablet Commonly known as: ULTRAM Take 1-2 tablets (50-100 mg total) by mouth every 6 (six) hours as needed for moderate pain or severe pain.   Vitamin D (Ergocalciferol) 1.25 MG (50000 UNIT) Caps capsule Commonly known as: DRISDOL Take 1 capsule (50,000 Units total) by mouth every 7 (seven) days.               Discharge Care Instructions  (From admission, onward)           Start     Ordered   11/01/21 0000  Weight bearing as tolerated        11/01/21 0825   11/01/21 0000  Change dressing       Comments: You may remove the  bulky bandage (ACE wrap and gauze) two days after surgery. You will have an adhesive waterproof bandage underneath. Leave this in place until your first follow-up appointment.   11/01/21 0825            Follow-up Information     Gaynelle Arabian, MD. Go on 11/15/2021.   Specialty: Orthopedic Surgery Why: You are scheduled for a follow up appointment on 11-15-21 at 1:45 pm Contact information: [address] [place] [address] [phone]                 Signed: Theresa Duty, PA-C Orthopedic Surgery 11/16/2021, 12:06 PM

## 2021-11-28 ENCOUNTER — Ambulatory Visit (INDEPENDENT_AMBULATORY_CARE_PROVIDER_SITE_OTHER): Payer: Medicare HMO

## 2021-11-28 VITALS — Ht 68.0 in | Wt 117.0 lb

## 2021-11-28 DIAGNOSIS — Z0000 Encounter for general adult medical examination without abnormal findings: Secondary | ICD-10-CM

## 2021-11-28 NOTE — Patient Instructions (Signed)
[name] , Thank you for taking time to come for your Medicare Wellness Visit. I appreciate your ongoing commitment to your health goals. Please review the following plan we discussed and let me know if I can assist you in the future.   Screening recommendations/referrals: Colonoscopy: done 12/17/2015 - Repeat in 10 years if recommended Mammogram: Done 05/19/2021 - Repeat annually Bone Density: Done 09/07/2020 - Repeat every 2 years  Recommended yearly ophthalmology/optometry visit for glaucoma screening and checkup Recommended yearly dental visit for hygiene and checkup  Vaccinations: Influenza vaccine: Done 09/07/2021 - Repeat annually  Pneumococcal vaccine: Done 04/27/2015 & 05/12/2016 Tdap vaccine: Done 12/14/2015 - Repeat in 10 years Shingles vaccine: Done   11/28/2019 & 04/22/2020 Covid-19: Done 02/19/2020, 03/24/2020, 11/24/2020, & 09/07/2021  Advanced directives: Please bring a copy of your health care power of attorney and living will to the office to be added to your chart at your convenience.   Conditions/risks identified: Aim for 30 minutes of exercise or brisk walking each day, drink 6-8 glasses of water and eat lots of fruits and vegetables.   Next appointment: Follow up in one year for your annual wellness visit    Preventive Care 65 Years and Older, Female Preventive care refers to lifestyle choices and visits with your health care provider that can promote health and wellness. What does preventive care include? A yearly physical exam. This is also called an annual well check. Dental exams once or twice a year. Routine eye exams. Ask your health care provider how often you should have your eyes checked. Personal lifestyle choices, including: Daily care of your teeth and gums. Regular physical activity. Eating a healthy diet. Avoiding tobacco and drug use. Limiting alcohol use. Practicing safe sex. Taking low-dose aspirin every day. Taking vitamin and mineral supplements as  recommended by your health care provider. What happens during an annual well check? The services and screenings done by your health care provider during your annual well check will depend on your age, overall health, lifestyle risk factors, and family history of disease. Counseling  Your health care provider may ask you questions about your: Alcohol use. Tobacco use. Drug use. Emotional well-being. Home and relationship well-being. Sexual activity. Eating habits. History of falls. Memory and ability to understand (cognition). Work and work Statistician. Reproductive health. Screening  You may have the following tests or measurements: Height, weight, and BMI. Blood pressure. Lipid and cholesterol levels. These may be checked every 5 years, or more frequently if you are over 50 years old. Skin check. Lung cancer screening. You may have this screening every year starting at age 19 if you have a 30-pack-year history of smoking and currently smoke or have quit within the past 15 years. Fecal occult blood test (FOBT) of the stool. You may have this test every year starting at age 90. Flexible sigmoidoscopy or colonoscopy. You may have a sigmoidoscopy every 5 years or a colonoscopy every 10 years starting at age 74. Hepatitis C blood test. Hepatitis B blood test. Sexually transmitted disease (STD) testing. Diabetes screening. This is done by checking your blood sugar (glucose) after you have not eaten for a while (fasting). You may have this done every 1-3 years. Bone density scan. This is done to screen for osteoporosis. You may have this done starting at age 53. Mammogram. This may be done every 1-2 years. Talk to your health care provider about how often you should have regular mammograms. Talk with your health care provider about your test  results, treatment options, and if necessary, the need for more tests. Vaccines  Your health care provider may recommend certain vaccines, such  as: Influenza vaccine. This is recommended every year. Tetanus, diphtheria, and acellular pertussis (Tdap, Td) vaccine. You may need a Td booster every 10 years. Zoster vaccine. You may need this after age 21. Pneumococcal 13-valent conjugate (PCV13) vaccine. One dose is recommended after age 6. Pneumococcal polysaccharide (PPSV23) vaccine. One dose is recommended after age 53. Talk to your health care provider about which screenings and vaccines you need and how often you need them. This information is not intended to replace advice given to you by your health care provider. Make sure you discuss any questions you have with your health care provider. Document Released: 12/24/2015 Document Revised: 08/16/2016 Document Reviewed: 09/28/2015 Elsevier Interactive Patient Education  2017 [employer] Prevention in the Home Falls can cause injuries. They can happen to people of all ages. There are many things you can do to make your home safe and to help prevent falls. What can I do on the outside of my home? Regularly fix the edges of walkways and driveways and fix any cracks. Remove anything that might make you trip as you walk through a door, such as a raised step or threshold. Trim any bushes or trees on the path to your home. Use bright outdoor lighting. Clear any walking paths of anything that might make someone trip, such as rocks or tools. Regularly check to see if handrails are loose or broken. Make sure that both sides of any steps have handrails. Any raised decks and porches should have guardrails on the edges. Have any leaves, snow, or ice cleared regularly. Use sand or salt on walking paths during winter. Clean up any spills in your garage right away. This includes oil or grease spills. What can I do in the bathroom? Use night lights. Install grab bars by the toilet and in the tub and shower. Do not use towel bars as grab bars. Use non-skid mats or decals in the tub or  shower. If you need to sit down in the shower, use a plastic, non-slip stool. Keep the floor dry. Clean up any water that spills on the floor as soon as it happens. Remove soap buildup in the tub or shower regularly. Attach bath mats securely with double-sided non-slip rug tape. Do not have throw rugs and other things on the floor that can make you trip. What can I do in the bedroom? Use night lights. Make sure that you have a light by your bed that is easy to reach. Do not use any sheets or blankets that are too big for your bed. They should not hang down onto the floor. Have a firm chair that has side arms. You can use this for support while you get dressed. Do not have throw rugs and other things on the floor that can make you trip. What can I do in the kitchen? Clean up any spills right away. Avoid walking on wet floors. Keep items that you use a lot in easy-to-reach places. If you need to reach something above you, use a strong step stool that has a grab bar. Keep electrical cords out of the way. Do not use floor polish or wax that makes floors slippery. If you must use wax, use non-skid floor wax. Do not have throw rugs and other things on the floor that can make you trip. What can I do with my stairs?  Do not leave any items on the stairs. Make sure that there are handrails on both sides of the stairs and use them. Fix handrails that are broken or loose. Make sure that handrails are as long as the stairways. Check any carpeting to make sure that it is firmly attached to the stairs. Fix any carpet that is loose or worn. Avoid having throw rugs at the top or bottom of the stairs. If you do have throw rugs, attach them to the floor with carpet tape. Make sure that you have a light switch at the top of the stairs and the bottom of the stairs. If you do not have them, ask someone to add them for you. What else can I do to help prevent falls? Wear shoes that: Do not have high heels. Have  rubber bottoms. Are comfortable and fit you well. Are closed at the toe. Do not wear sandals. If you use a stepladder: Make sure that it is fully opened. Do not climb a closed stepladder. Make sure that both sides of the stepladder are locked into place. Ask someone to hold it for you, if possible. Clearly mark and make sure that you can see: Any grab bars or handrails. First and last steps. Where the edge of each step is. Use tools that help you move around (mobility aids) if they are needed. These include: Canes. Walkers. Scooters. Crutches. Turn on the lights when you go into a dark area. Replace any light bulbs as soon as they burn out. Set up your furniture so you have a clear path. Avoid moving your furniture around. If any of your floors are uneven, fix them. If there are any pets around you, be aware of where they are. Review your medicines with your doctor. Some medicines can make you feel dizzy. This can increase your chance of falling. Ask your doctor what other things that you can do to help prevent falls. This information is not intended to replace advice given to you by your health care provider. Make sure you discuss any questions you have with your health care provider. Document Released: 09/23/2009 Document Revised: 05/04/2016 Document Reviewed: 01/01/2015 Elsevier Interactive Patient Education  2017 [employer].

## 2021-11-28 NOTE — Progress Notes (Signed)
Subjective:   [name] is a 75 y.o. female who presents for Medicare Annual (Subsequent) preventive examination.  Virtual Visit via Telephone Note  I connected with  [name] on 11/28/21 at  1:15 PM EST by telephone and verified that I am speaking with the correct person using two identifiers.  Location: Patient: Home Provider: [facility] Persons participating in the virtual visit: patient/Nurse Health Advisor   I discussed the limitations, risks, security and privacy concerns of performing an evaluation and management service by telephone and the availability of in person appointments. The patient expressed understanding and agreed to proceed.  Interactive audio and video telecommunications were attempted between this nurse and patient, however failed, due to patient having technical difficulties OR patient did not have access to video capability.  We continued and completed visit with audio only.  Some vital signs may be absent or patient reported.   Simone Rodenbeck E Kason Benak, LPN   Review of Systems     Cardiac Risk Factors include: advanced age (>31men, >10 women);sedentary lifestyle;dyslipidemia;hypertension;Other (see comment), Risk factor comments: carotid artery narrowing     Objective:    Today's Vitals   11/28/21 1320 11/28/21 1321  Weight: 117 lb (53.1 kg)   Height: 5\' 8"  (1.727 m)   PainSc:  4    Body mass index is 17.79 kg/m.  Advanced Directives 11/28/2021 10/31/2021 10/18/2021 12/22/2020 12/22/2020 11/25/2020 09/22/2020  Does Patient Have a Medical Advance Directive? Yes Yes Yes Yes Yes No Yes  Type of Paramedic of [place];Living will [place];Living will [place];Living will [place];Living will [place];Living will - -  Does patient want to make changes to medical advance directive? - No - Patient declined - - - - -  Copy of [place] in Chart? No - copy requested No - copy requested - - - - -  Would patient like information on creating a medical advance directive? - - - - - Yes (MAU/Ambulatory/Procedural Areas - Information given) -  Pre-existing out of facility DNR order (yellow form or pink MOST form) - - - - - - -    Current Medications (verified) Outpatient Encounter Medications as of 11/28/2021  Medication Sig   alendronate (FOSAMAX) 70 MG tablet Take 1 tablet (70 mg total) by mouth once a week. Take with a full glass of water on an empty stomach.   fluconazole (DIFLUCAN) 100 MG tablet Take two with first dose. Then starting the next day take one daily until all are taken.   fluticasone (FLONASE) 50 MCG/ACT nasal spray Place 2 sprays into both nostrils daily. (Patient taking differently: Place 2 sprays into both nostrils daily as needed for allergies.)   folic acid (FOLVITE) 409 MCG tablet Take 2,400 mcg by mouth daily.   levothyroxine (SYNTHROID) 100 MCG tablet Take 1 tablet (100 mcg total) by mouth daily.   methocarbamol (ROBAXIN) 500 MG tablet Take 1 tablet (500 mg total) by mouth every 6 (six) hours as needed for muscle spasms.   metoprolol succinate (TOPROL-XL) 100 MG 24 hr tablet Take 1 tablet (100 mg total) by mouth daily. For blood pressure control   olmesartan (BENICAR) 20 MG tablet Take 1 tablet by mouth once daily   ondansetron (ZOFRAN) 4 MG tablet Take 8 mg by mouth 2 (two) times daily.   pregabalin (LYRICA) 200 MG capsule Take 1 capsule (200 mg total) by mouth daily.   rosuvastatin (CRESTOR) 10  MG tablet TAKE 1 TABLET EVERY DAY   traMADol (ULTRAM) 50 MG tablet Take 1-2 tablets (50-100 mg total) by mouth every 6 (six) hours as needed for moderate pain or severe pain.   Vitamin D, Ergocalciferol, (DRISDOL) 1.25 MG (50000 UNIT) CAPS capsule Take 1 capsule (50,000 Units total) by mouth every 7 (seven) days.   No facility-administered encounter medications on file as of 11/28/2021.    Allergies  (verified) Ciprofloxacin, Procardia [nifedipine], and Norvasc [amlodipine besylate]   History: Past Medical History:  Diagnosis Date   Acute pyelonephritis 10/15/2019   Acute renal failure superimposed on stage 2 chronic kidney disease ([place]) 10/15/2019   Alcoholism ([place]) 01/09/2012   Anxiety    Arthritis    Left knee   B12 deficiency    Carotid artery narrowing 10/03/2012   On the right.   Chronic kidney disease (CKD), stage II (mild)    Depression    Headache(784.0)    Hyperlipidemia    Hypertension    Hyponatremia 01/09/2012   Hypothyroid    Menopause    Mini stroke    Orthostatic syncope 10/02/2012   SBO (small bowel obstruction) ([place]) 06/28/2016   Stroke ([place]) 2010   no deficits   Thrombocytopenia ([place]) 10/15/2019   Past Surgical History:  Procedure Laterality Date   AGILE CAPSULE N/A 02/22/2016   dummy capsule remained in the distal ileum   APPENDECTOMY     CATARACT EXTRACTION W/PHACO  06/20/2012   Procedure: CATARACT EXTRACTION PHACO AND INTRAOCULAR LENS PLACEMENT ([place]);  Surgeon: Tonny Branch, MD;  Location: [facility];  Service: Ophthalmology;  Laterality: Right;  CDE:10.66   CATARACT EXTRACTION W/PHACO  07/11/2012   Procedure: CATARACT EXTRACTION PHACO AND INTRAOCULAR LENS PLACEMENT ([facility]);  Surgeon: Tonny Branch, MD;  Location: [facility];  Service: Ophthalmology;  Laterality: Left;  CDE: 12.69   COLONOSCOPY N/A 12/17/2015   RMR: Marked circumferential ulceration of the ascending colon/Ileocecal valve ulceration with biopsy most consistent with NSAID injury versus inflammatory bowel disease, pancolonoc diverticulosis redundant colon   ESOPHAGOGASTRODUODENOSCOPY N/A 12/17/2015   [facility]:[id number] gastric polyp, biopsy benign   KNEE ARTHROSCOPY Right 09/2016   LUMBAR LAMINECTOMY/DECOMPRESSION MICRODISCECTOMY N/A 05/25/2017   Procedure: CENTRAL DECOMPRESSIVE LUMBAR LAMINECTOMY FOR SPINAL STENOSIS L3-L4, FORAMINOTOMY FOR THE L4 ROOT AND L5 ROOT;  Surgeon: Latanya Maudlin, MD;  Location: [facility];  Service: Orthopedics;  Laterality: N/A;   TONSILLECTOMY     TOTAL KNEE ARTHROPLASTY Left 10/31/2021   Procedure: TOTAL KNEE ARTHROPLASTY;  Surgeon: Gaynelle Arabian, MD;  Location: [facility];  Service: Orthopedics;  Laterality: Left;   Family History  Problem Relation Age of Onset   GER disease Mother    Ulcers Mother    Hypertension Maternal Grandmother    Social History   Socioeconomic History   Marital status: Married    Spouse name: Maricela Curet"   Number of children: 2   Years of education: 14   Highest education level: Some college, no degree  Occupational History   Occupation: Psychologist, educational: [employer]    Comment: Retired  Tobacco Use   Smoking status: Former    Packs/day: 2.00    Years: 30.00    Pack years: 60.00    Types: Cigarettes    Quit date: 06/17/1992    Years since quitting: 29.4   Smokeless tobacco: Never  Vaping Use   Vaping Use: Never used  Substance and Sexual Activity   Alcohol use: No   Drug use: No   Sexual  activity: Not Currently  Other Topics Concern   Not on file  Social History Narrative   Lives on one level with her husband, Laruth Bouchard lives 3 streets over   Social Determinants of Radio broadcast assistant Strain: Low Risk    Difficulty of Paying Living Expenses: Not hard at all  Food Insecurity: No Food Insecurity   Worried About Charity fundraiser in the Last Year: Never true   Arboriculturist in the Last Year: Never true  Transportation Needs: No Transportation Needs   Lack of Transportation (Medical): No   Lack of Transportation (Non-Medical): No  Physical Activity: Insufficiently Active   Days of Exercise per Week: 7 days   Minutes of Exercise per Session: 10 min  Stress: No Stress Concern Present   Feeling of Stress : Not at all  Social Connections: Moderately Isolated   Frequency of Communication with Friends and Family: More than three times a week   Frequency of Social Gatherings with  Friends and Family: More than three times a week   Attends Religious Services: Never   Marine scientist or Organizations: No   Attends Music therapist: Never   Marital Status: Married    Tobacco Counseling Counseling given: Not Answered   Clinical Intake:  Pre-visit preparation completed: Yes  Pain : 0-10 Pain Score: 4  Pain Type: Chronic pain Pain Location: Knee Pain Orientation: Left Pain Descriptors / Indicators: Aching, Discomfort, Sore Pain Onset: More than a month ago Pain Frequency: Intermittent     BMI - recorded: 17.79 Nutritional Status: BMI <19  Underweight Nutritional Risks: Unintentional weight loss Diabetes: No  How often do you need to have someone help you when you read instructions, pamphlets, or other written materials from your doctor or pharmacy?: 1 - Never  Diabetic? no  Interpreter Needed?: No  Information entered by :: Hamdi Vari, LPN   Activities of Daily Living In your present state of health, do you have any difficulty performing the following activities: 11/28/2021 10/31/2021  Hearing? N -  Vision? N -  Difficulty concentrating or making decisions? N -  Walking or climbing stairs? Y -  Dressing or bathing? Y -  Comment husband assists since knee surgery -  Doing errands, shopping? Y N  Comment she can usually drive, but since knee surgery only husband drives -  Conservation officer, nature and eating ? N -  Using the Toilet? N -  In the past six months, have you accidently leaked urine? N -  Do you have problems with loss of bowel control? N -  Managing your Medications? N -  Managing your Finances? N -  Housekeeping or managing your Housekeeping? Y -  Some recent data might be hidden    Patient Care Team: Claretta Fraise, MD as PCP - General (Family Medicine) Gala Romney Cristopher Estimable, MD as Consulting Physician (Gastroenterology) Latanya Maudlin, MD as Consulting Physician (Orthopedic Surgery) Ilean China, RN as Case  Manager  Indicate any recent Medical Services you may have received from other than [facility] providers in the past year (date may be approximate).     Assessment:   This is a routine wellness examination for [name].  Hearing/Vision screen Hearing Screening - Comments:: Denies hearing difficulties  Vision Screening - Comments:: Wears rx glasses - up to date with annual eye exams with [facility]  Dietary issues and exercise activities discussed: Current Exercise Habits: Home exercise routine, Type of exercise: walking;stretching, Time (  Minutes): 10, Frequency (Times/Week): 7, Weekly Exercise (Minutes/Week): 70, Intensity: Mild, Exercise limited by: orthopedic condition(s)   Goals Addressed             This Visit's Progress    DIET - INCREASE WATER INTAKE   On track    Try to drink 6-8 glasses of water daily.     Exercise 3x per week (30 min per time)   On track    Chair exercises for 15 minutes daily. Refer to handout that was given.    11/25/2020 AWV Goal: Exercise for General Health  Patient will verbalize understanding of the benefits of increased physical activity: Exercising regularly is important. It will improve your overall fitness, flexibility, and endurance. Regular exercise also will improve your overall health. It can help you control your weight, reduce stress, and improve your bone density. Over the next year, patient will increase physical activity as tolerated with a goal of at least 150 minutes of moderate physical activity per week.  You can tell that you are exercising at a moderate intensity if your heart starts beating faster and you start breathing faster but can still hold a conversation. Moderate-intensity exercise ideas include: Walking 1 mile (1.6 km) in about 15 minutes Biking Hiking Golfing Dancing Water aerobics Patient will verbalize understanding of everyday activities that increase physical activity by providing examples like the  following: Yard work, such as: Sales promotion account executive Gardening Washing windows or floors Patient will be able to explain general safety guidelines for exercising:  Before you start a new exercise program, talk with your health care provider. Do not exercise so much that you hurt yourself, feel dizzy, or get very short of breath. Wear comfortable clothes and wear shoes with good support. Drink plenty of water while you exercise to prevent dehydration or heat stroke. Work out until your breathing and your heartbeat get faster.      LIFESTYLE - DECREASE FALLS RISK   On track    Limit fluids after supper and recommend a bedside commode         Depression Screen PHQ 2/9 Scores 11/28/2021 09/21/2021 07/26/2021 05/17/2021 04/25/2021 04/25/2021 01/26/2021  PHQ - 2 Score 0 0 0 0 0 0 0  PHQ- 9 Score - - - 1 3 - -    Fall Risk Fall Risk  11/28/2021 09/21/2021 07/26/2021 06/20/2021 05/17/2021  Falls in the past year? 1 1 1 1 1   Number falls in past yr: 1 1 1 1 1   Comment - - - - -  Injury with Fall? 0 0 0 0 0  Comment - - - - -  Risk Factor Category  - - - - -  Risk for fall due to : Impaired balance/gait;Orthopedic patient;Medication side effect History of fall(s);Impaired balance/gait History of fall(s);Impaired balance/gait History of fall(s);Impaired balance/gait History of fall(s);Impaired balance/gait  Risk for fall due to: Comment - - - - -  Follow up Education provided;Falls prevention discussed Falls evaluation completed Falls evaluation completed Falls evaluation completed Falls evaluation completed    FALL RISK PREVENTION PERTAINING TO THE HOME:  Any stairs in or around the home? No  If so, are there any without handrails? No  Home free of loose throw rugs in walkways, pet beds, electrical cords, etc? Yes  Adequate lighting in your home to reduce risk of falls? Yes   ASSISTIVE DEVICES UTILIZED TO PREVENT  FALLS:  Life alert? No  Use of a cane, walker or w/c? Yes  Grab bars in the bathroom? Yes  Shower chair or bench in shower? Yes  Elevated toilet seat or a handicapped toilet? Yes   TIMED UP AND GO:  Was the test performed? No . Telephonic visit  Cognitive Function: Normal cognitive status assessed by direct observation by this Nurse Health Advisor. No abnormalities found.    MMSE - Mini Mental State Exam 06/25/2018 09/18/2017  Orientation to time 5 4  Orientation to Place 5 5  Registration 3 3  Attention/ Calculation 5 5  Recall 3 2  Language- name 2 objects 2 2  Language- repeat 1 1  Language- follow 3 step command 2 3  Language- read & follow direction 1 1  Write a sentence 1 1  Copy design 1 0  Total score 29 27     6CIT Screen 11/25/2020 08/01/2019  What Year? 0 points 0 points  What month? 0 points 0 points  What time? 0 points 0 points  Count back from 20 0 points 0 points  Months in reverse 0 points 0 points  Repeat phrase 0 points 2 points  Total Score 0 2    Immunizations Immunization History  Administered Date(s) Administered   Fluad Quad(high Dose 65+) 09/29/2019, 09/07/2020   Influenza, High Dose Seasonal PF 09/23/2014, 09/11/2016, 09/05/2017, 09/16/2018   Influenza,inj,Quad PF,6+ Mos 10/01/2013, 09/10/2015   Moderna SARS-COV2 Booster Vaccination 11/24/2020, 09/07/2021   Moderna Sars-Covid-2 Vaccination 02/19/2020, 03/24/2020   Pneumococcal Conjugate-13 05/12/2016   Pneumococcal Polysaccharide-23 01/10/2012, 04/27/2015   Tdap 12/14/2015   Zoster Recombinat (Shingrix) 11/28/2019, 04/22/2020    TDAP status: Up to date  Flu Vaccine status: Up to date  Pneumococcal vaccine status: Up to date  Covid-19 vaccine status: Completed vaccines  Qualifies for Shingles Vaccine? Yes   Zostavax completed Yes   Shingrix Completed?: Yes  Screening Tests Health Maintenance  Topic Date Due   INFLUENZA VACCINE  07/11/2021   COVID-19 Vaccine (3 - Booster for  Moderna series) 11/02/2021   MAMMOGRAM  05/19/2022   DEXA SCAN  09/07/2022   TETANUS/TDAP  12/13/2025   COLONOSCOPY (Pts 45-53yrs Insurance coverage will need to be confirmed)  12/16/2025   Pneumonia Vaccine 42+ Years old  Completed   Hepatitis C Screening  Completed   Zoster Vaccines- Shingrix  Completed   HPV VACCINES  Aged Out    Health Maintenance  Health Maintenance Due  Topic Date Due   INFLUENZA VACCINE  07/11/2021   COVID-19 Vaccine (3 - Booster for Moderna series) 11/02/2021    Colorectal cancer screening: Type of screening: Colonoscopy. Completed 12/17/2015. Repeat every 10 years  Mammogram status: Completed 05/19/2021. Repeat every year  Bone Density status: Completed 09/07/2020. Results reflect: Bone density results: OSTEOPENIA. Repeat every 2 years.  Lung Cancer Screening: (Low Dose CT Chest recommended if Age 49-80 years, 30 pack-year currently smoking OR have quit w/in 15years.) does not qualify.   Additional Screening:  Hepatitis C Screening: does qualify; Completed 01/27/2016  Vision Screening: Recommended annual ophthalmology exams for early detection of glaucoma and other disorders of the eye. Is the patient up to date with their annual eye exam?  Yes  Who is the provider or what is the name of the office in which the patient attends annual eye exams? [place] If pt is not established with a provider, would they like to be referred to a provider to establish care? No .   Dental Screening: Recommended annual dental exams for proper  oral hygiene  Community Resource Referral / Chronic Care Management: CRR required this visit?  No   CCM required this visit?  No      Plan:     I have personally reviewed and noted the following in the patients chart:   Medical and social history Use of alcohol, tobacco or illicit drugs  Current medications and supplements including opioid prescriptions.  Functional ability and status Nutritional status Physical  activity Advanced directives List of other physicians Hospitalizations, surgeries, and ER visits in previous 12 months Vitals Screenings to include cognitive, depression, and falls Referrals and appointments  In addition, I have reviewed and discussed with patient certain preventive protocols, quality metrics, and best practice recommendations. A written personalized care plan for preventive services as well as general preventive health recommendations were provided to patient.     Sandrea Hammond, LPN   [id number]/02/[id number]   Nurse Notes: None

## 2022-01-27 ENCOUNTER — Emergency Department (HOSPITAL_COMMUNITY): Payer: Medicare HMO

## 2022-01-27 ENCOUNTER — Inpatient Hospital Stay (HOSPITAL_COMMUNITY)
Admission: EM | Admit: 2022-01-27 | Discharge: 2022-01-29 | DRG: 371 | Disposition: A | Discharge status: Home Health | Payer: Medicare HMO | Attending: Family Medicine | Admitting: Family Medicine

## 2022-01-27 ENCOUNTER — Other Ambulatory Visit: Payer: Self-pay

## 2022-01-27 ENCOUNTER — Observation Stay (HOSPITAL_COMMUNITY): Payer: Medicare HMO

## 2022-01-27 ENCOUNTER — Encounter (HOSPITAL_COMMUNITY): Payer: Self-pay | Admitting: Pharmacy Technician

## 2022-01-27 DIAGNOSIS — R64 Cachexia: Secondary | ICD-10-CM | POA: Diagnosis present

## 2022-01-27 DIAGNOSIS — R112 Nausea with vomiting, unspecified: Secondary | ICD-10-CM | POA: Diagnosis present

## 2022-01-27 DIAGNOSIS — E039 Hypothyroidism, unspecified: Secondary | ICD-10-CM | POA: Diagnosis not present

## 2022-01-27 DIAGNOSIS — E869 Volume depletion, unspecified: Secondary | ICD-10-CM | POA: Diagnosis present

## 2022-01-27 DIAGNOSIS — I10 Essential (primary) hypertension: Secondary | ICD-10-CM | POA: Diagnosis not present

## 2022-01-27 DIAGNOSIS — A0472 Enterocolitis due to Clostridium difficile, not specified as recurrent: Secondary | ICD-10-CM | POA: Diagnosis present

## 2022-01-27 DIAGNOSIS — Z888 Allergy status to other drugs, medicaments and biological substances status: Secondary | ICD-10-CM | POA: Diagnosis not present

## 2022-01-27 DIAGNOSIS — R197 Diarrhea, unspecified: Secondary | ICD-10-CM | POA: Diagnosis not present

## 2022-01-27 DIAGNOSIS — Z681 Body mass index (BMI) 19 or less, adult: Secondary | ICD-10-CM

## 2022-01-27 DIAGNOSIS — I129 Hypertensive chronic kidney disease with stage 1 through stage 4 chronic kidney disease, or unspecified chronic kidney disease: Secondary | ICD-10-CM | POA: Diagnosis not present

## 2022-01-27 DIAGNOSIS — E871 Hypo-osmolality and hyponatremia: Secondary | ICD-10-CM | POA: Diagnosis not present

## 2022-01-27 DIAGNOSIS — R4182 Altered mental status, unspecified: Secondary | ICD-10-CM | POA: Diagnosis not present

## 2022-01-27 DIAGNOSIS — E876 Hypokalemia: Secondary | ICD-10-CM | POA: Diagnosis present

## 2022-01-27 DIAGNOSIS — Z87891 Personal history of nicotine dependence: Secondary | ICD-10-CM | POA: Diagnosis not present

## 2022-01-27 DIAGNOSIS — N182 Chronic kidney disease, stage 2 (mild): Secondary | ICD-10-CM | POA: Diagnosis present

## 2022-01-27 DIAGNOSIS — E785 Hyperlipidemia, unspecified: Secondary | ICD-10-CM | POA: Diagnosis present

## 2022-01-27 DIAGNOSIS — E038 Other specified hypothyroidism: Secondary | ICD-10-CM | POA: Diagnosis not present

## 2022-01-27 DIAGNOSIS — E538 Deficiency of other specified B group vitamins: Secondary | ICD-10-CM | POA: Diagnosis present

## 2022-01-27 DIAGNOSIS — Z79899 Other long term (current) drug therapy: Secondary | ICD-10-CM | POA: Diagnosis not present

## 2022-01-27 DIAGNOSIS — Z8249 Family history of ischemic heart disease and other diseases of the circulatory system: Secondary | ICD-10-CM

## 2022-01-27 DIAGNOSIS — E162 Hypoglycemia, unspecified: Secondary | ICD-10-CM | POA: Diagnosis not present

## 2022-01-27 DIAGNOSIS — Z96652 Presence of left artificial knee joint: Secondary | ICD-10-CM | POA: Diagnosis present

## 2022-01-27 DIAGNOSIS — D1803 Hemangioma of intra-abdominal structures: Secondary | ICD-10-CM | POA: Diagnosis not present

## 2022-01-27 DIAGNOSIS — Z79890 Hormone replacement therapy (postmenopausal): Secondary | ICD-10-CM | POA: Diagnosis not present

## 2022-01-27 DIAGNOSIS — Z8673 Personal history of transient ischemic attack (TIA), and cerebral infarction without residual deficits: Secondary | ICD-10-CM

## 2022-01-27 DIAGNOSIS — K6389 Other specified diseases of intestine: Secondary | ICD-10-CM | POA: Diagnosis not present

## 2022-01-27 DIAGNOSIS — Z20822 Contact with and (suspected) exposure to covid-19: Secondary | ICD-10-CM | POA: Diagnosis present

## 2022-01-27 DIAGNOSIS — G9340 Encephalopathy, unspecified: Secondary | ICD-10-CM | POA: Diagnosis not present

## 2022-01-27 DIAGNOSIS — Z743 Need for continuous supervision: Secondary | ICD-10-CM | POA: Diagnosis not present

## 2022-01-27 DIAGNOSIS — K8020 Calculus of gallbladder without cholecystitis without obstruction: Secondary | ICD-10-CM | POA: Diagnosis not present

## 2022-01-27 DIAGNOSIS — E872 Acidosis, unspecified: Secondary | ICD-10-CM | POA: Diagnosis not present

## 2022-01-27 DIAGNOSIS — Z7983 Long term (current) use of bisphosphonates: Secondary | ICD-10-CM

## 2022-01-27 DIAGNOSIS — G9341 Metabolic encephalopathy: Secondary | ICD-10-CM | POA: Diagnosis present

## 2022-01-27 DIAGNOSIS — R300 Dysuria: Secondary | ICD-10-CM | POA: Diagnosis not present

## 2022-01-27 DIAGNOSIS — Z043 Encounter for examination and observation following other accident: Secondary | ICD-10-CM | POA: Diagnosis not present

## 2022-01-27 DIAGNOSIS — R54 Age-related physical debility: Secondary | ICD-10-CM | POA: Diagnosis present

## 2022-01-27 DIAGNOSIS — I6529 Occlusion and stenosis of unspecified carotid artery: Secondary | ICD-10-CM | POA: Diagnosis present

## 2022-01-27 DIAGNOSIS — R188 Other ascites: Secondary | ICD-10-CM | POA: Diagnosis not present

## 2022-01-27 DIAGNOSIS — R410 Disorientation, unspecified: Secondary | ICD-10-CM | POA: Diagnosis not present

## 2022-01-27 DIAGNOSIS — Z881 Allergy status to other antibiotic agents status: Secondary | ICD-10-CM

## 2022-01-27 DIAGNOSIS — J984 Other disorders of lung: Secondary | ICD-10-CM | POA: Diagnosis not present

## 2022-01-27 LAB — RAPID URINE DRUG SCREEN, HOSP PERFORMED
Amphetamines: NOT DETECTED
Barbiturates: NOT DETECTED
Benzodiazepines: NOT DETECTED
Cocaine: NOT DETECTED
Opiates: NOT DETECTED
Tetrahydrocannabinol: NOT DETECTED

## 2022-01-27 LAB — URINALYSIS, ROUTINE W REFLEX MICROSCOPIC
Bilirubin Urine: NEGATIVE
Glucose, UA: NEGATIVE mg/dL
Ketones, ur: NEGATIVE mg/dL
Leukocytes,Ua: NEGATIVE
Nitrite: NEGATIVE
Protein, ur: 30 mg/dL — AB
Renal Epithelial: 7
Specific Gravity, Urine: 1.009 (ref 1.005–1.030)
pH: 8 (ref 5.0–8.0)

## 2022-01-27 LAB — CBC WITH DIFFERENTIAL/PLATELET
Abs Immature Granulocytes: 0.04 10*3/uL (ref 0.00–0.07)
Basophils Absolute: 0 10*3/uL (ref 0.0–0.1)
Basophils Relative: 0 %
Eosinophils Absolute: 0.2 10*3/uL (ref 0.0–0.5)
Eosinophils Relative: 3 %
HCT: 29.7 % — ABNORMAL LOW (ref 36.0–46.0)
Hemoglobin: 10.8 g/dL — ABNORMAL LOW (ref 12.0–15.0)
Immature Granulocytes: 1 %
Lymphocytes Relative: 8 %
Lymphs Abs: 0.5 10*3/uL — ABNORMAL LOW (ref 0.7–4.0)
MCH: 34.8 pg — ABNORMAL HIGH (ref 26.0–34.0)
MCHC: 36.4 g/dL — ABNORMAL HIGH (ref 30.0–36.0)
MCV: 95.8 fL (ref 80.0–100.0)
Monocytes Absolute: 0.5 10*3/uL (ref 0.1–1.0)
Monocytes Relative: 8 %
Neutro Abs: 4.5 10*3/uL (ref 1.7–7.7)
Neutrophils Relative %: 80 %
Platelets: 111 10*3/uL — ABNORMAL LOW (ref 150–400)
RBC: 3.1 MIL/uL — ABNORMAL LOW (ref 3.87–5.11)
RDW: 12.8 % (ref 11.5–15.5)
WBC: 5.6 10*3/uL (ref 4.0–10.5)
nRBC: 0 % (ref 0.0–0.2)

## 2022-01-27 LAB — RESP PANEL BY RT-PCR (FLU A&B, COVID) ARPGX2
Influenza A by PCR: NEGATIVE
Influenza B by PCR: NEGATIVE
SARS Coronavirus 2 by RT PCR: NEGATIVE

## 2022-01-27 LAB — COMPREHENSIVE METABOLIC PANEL
ALT: 20 U/L (ref 0–44)
AST: 36 U/L (ref 15–41)
Albumin: 2.8 g/dL — ABNORMAL LOW (ref 3.5–5.0)
Alkaline Phosphatase: 102 U/L (ref 38–126)
Anion gap: 11 (ref 5–15)
BUN: 10 mg/dL (ref 8–23)
CO2: 19 mmol/L — ABNORMAL LOW (ref 22–32)
Calcium: 7.2 mg/dL — ABNORMAL LOW (ref 8.9–10.3)
Chloride: 102 mmol/L (ref 98–111)
Creatinine, Ser: 0.97 mg/dL (ref 0.44–1.00)
GFR, Estimated: >60 mL/min (ref >60)
Glucose, Bld: 86 mg/dL (ref 70–99)
Potassium: 2.5 mmol/L — CL (ref 3.5–5.1)
Sodium: 132 mmol/L — ABNORMAL LOW (ref 135–145)
Total Bilirubin: 1.8 mg/dL — ABNORMAL HIGH (ref 0.3–1.2)
Total Protein: 5.5 g/dL — ABNORMAL LOW (ref 6.5–8.1)

## 2022-01-27 LAB — VITAMIN B12: Vitamin B-12: 474 pg/mL (ref 180–914)

## 2022-01-27 LAB — MAGNESIUM: Magnesium: 1.8 mg/dL (ref 1.7–2.4)

## 2022-01-27 LAB — CBG MONITORING, ED: Glucose-Capillary: 92 mg/dL (ref 70–99)

## 2022-01-27 MED ORDER — POTASSIUM CHLORIDE 10 MEQ/100ML IV SOLN
10.0000 meq | Freq: Once | INTRAVENOUS | Status: AC
Start: 2022-01-27 — End: 2022-01-27
  Administered 2022-01-27: 10 meq via INTRAVENOUS
  Filled 2022-01-27: qty 100

## 2022-01-27 MED ORDER — MAGNESIUM SULFATE IN D5W 1-5 GM/100ML-% IV SOLN
1.0000 g | Freq: Once | INTRAVENOUS | Status: AC
  Administered 2022-01-27: 1 g via INTRAVENOUS
  Filled 2022-01-27: qty 100

## 2022-01-27 MED ORDER — IOHEXOL 9 MG/ML PO SOLN
ORAL | Status: AC
  Filled 2022-01-27: qty 1000

## 2022-01-27 MED ORDER — LEVOTHYROXINE SODIUM 100 MCG PO TABS
100.0000 ug | ORAL_TABLET | Freq: Every day | ORAL | Status: DC
  Administered 2022-01-28 – 2022-01-29 (×2): 100 ug via ORAL
  Filled 2022-01-27 (×2): qty 1

## 2022-01-27 MED ORDER — HEPARIN SODIUM (PORCINE) 5000 UNIT/ML IJ SOLN
5000.0000 [IU] | Freq: Three times a day (TID) | INTRAMUSCULAR | Status: DC
  Administered 2022-01-27 – 2022-01-29 (×5): 5000 [IU] via SUBCUTANEOUS
  Filled 2022-01-27 (×5): qty 1

## 2022-01-27 MED ORDER — FOLIC ACID 1 MG PO TABS
2400.0000 ug | ORAL_TABLET | Freq: Every day | ORAL | Status: DC
  Administered 2022-01-28 – 2022-01-29 (×2): 2.5 mg via ORAL
  Filled 2022-01-27 (×2): qty 3

## 2022-01-27 MED ORDER — POTASSIUM CHLORIDE CRYS ER 20 MEQ PO TBCR
20.0000 meq | EXTENDED_RELEASE_TABLET | Freq: Once | ORAL | Status: AC
  Administered 2022-01-27: 20 meq via ORAL
  Filled 2022-01-27: qty 1

## 2022-01-27 MED ORDER — LACTATED RINGERS IV SOLN: INTRAVENOUS | Status: DC

## 2022-01-27 MED ORDER — POTASSIUM CHLORIDE 10 MEQ/100ML IV SOLN
10.0000 meq | INTRAVENOUS | Status: AC
  Administered 2022-01-27 – 2022-01-28 (×5): 10 meq via INTRAVENOUS
  Filled 2022-01-27 (×4): qty 100

## 2022-01-27 MED ORDER — IOHEXOL 300 MG/ML  SOLN
100.0000 mL | Freq: Once | INTRAMUSCULAR | Status: AC | PRN
  Administered 2022-01-27: 100 mL via INTRAVENOUS

## 2022-01-27 MED ORDER — POTASSIUM CHLORIDE CRYS ER 20 MEQ PO TBCR
20.0000 meq | EXTENDED_RELEASE_TABLET | Freq: Once | ORAL | Status: AC
Start: 2022-01-27 — End: 2022-01-27
  Administered 2022-01-27: 20 meq via ORAL
  Filled 2022-01-27: qty 1

## 2022-01-27 MED ORDER — SODIUM CHLORIDE 0.9 % IV BOLUS
1000.0000 mL | Freq: Once | INTRAVENOUS | Status: AC
  Administered 2022-01-27: 1000 mL via INTRAVENOUS

## 2022-01-27 MED ORDER — METOPROLOL SUCCINATE ER 50 MG PO TB24
50.0000 mg | ORAL_TABLET | Freq: Every day | ORAL | Status: DC
  Administered 2022-01-27 – 2022-01-29 (×3): 50 mg via ORAL
  Filled 2022-01-27 (×3): qty 1

## 2022-01-27 NOTE — ED Provider Notes (Signed)
[facility] EMERGENCY DEPARTMENT Provider Note   CSN: [id number] Arrival date & time: 01/27/22  1352     History  Chief Complaint  Patient presents with   Altered Mental Status    [name] is a 76 y.o. female with a history of hypertension, B12 deficiency, chronic kidney disease stage II, hypothyroidism osteoarthritis who is approximately 2 months out from a left total knee arthroplasty presenting with a 5-day history of worsening confusion associated with diarrhea and increasing weakness per husband at bedside.  He noted that she developed diarrhea which was mild 5 days ago but over the past 2 days has become copious, nonbloody in association with generalized confusion and difficulty walking, stating he had to hold her up to get her into the emergency department today.  She denies any specific complaints at this time, but is obviously altered.  She does respond to questions, a little bit aggressively, and not always with appropriate answers.  She does endorse abdominal cramping with bowel movements, no other complaints.  She is alert and oriented to self only.  The history is provided by the patient.  Altered Mental Status Associated symptoms: no abdominal pain, no fever, no headaches, no light-headedness, no nausea, no rash, no vomiting and no weakness       Home Medications Prior to Admission medications   Medication Sig Start Date End Date Taking? Authorizing Provider  fluticasone (FLONASE) 50 MCG/ACT nasal spray Place 2 sprays into both nostrils daily. Patient taking differently: Place 2 sprays into both nostrils daily as needed for allergies. 07/26/21  Yes Stacks, Cletus Gash, MD  folic acid (FOLVITE) 163 MCG tablet Take 2,400 mcg by mouth daily.   Yes [provider]  levothyroxine (SYNTHROID) 100 MCG tablet Take 1 tablet (100 mcg total) by mouth daily. 09/21/21  Yes Claretta Fraise, MD  alendronate (FOSAMAX) 70 MG tablet Take 1 tablet (70 mg total) by mouth once a week.  Take with a full glass of water on an empty stomach. Patient not taking: Reported on 01/27/2022 01/26/21   Claretta Fraise, MD  fluconazole (DIFLUCAN) 100 MG tablet Take two with first dose. Then starting the next day take one daily until all are taken. Patient not taking: Reported on 01/27/2022 07/29/21   Claretta Fraise, MD  methocarbamol (ROBAXIN) 500 MG tablet Take 1 tablet (500 mg total) by mouth every 6 (six) hours as needed for muscle spasms. Patient not taking: Reported on 01/27/2022 11/01/21   Theresa Duty L, PA  metoprolol succinate (TOPROL-XL) 100 MG 24 hr tablet Take 1 tablet (100 mg total) by mouth daily. For blood pressure control Patient not taking: Reported on 01/27/2022 09/21/21   Claretta Fraise, MD  olmesartan (BENICAR) 20 MG tablet Take 1 tablet by mouth once daily Patient not taking: Reported on 01/27/2022 09/21/21   Claretta Fraise, MD  olmesartan (BENICAR) 40 MG tablet Take 40 mg by mouth daily. Patient not taking: Reported on 01/27/2022 08/31/21   [provider]  pregabalin (LYRICA) 200 MG capsule Take 1 capsule (200 mg total) by mouth daily. Patient not taking: Reported on 01/27/2022 09/21/21   Claretta Fraise, MD  rosuvastatin (CRESTOR) 10 MG tablet TAKE 1 TABLET EVERY DAY Patient not taking: Reported on 01/27/2022 06/14/21   Claretta Fraise, MD  traMADol (ULTRAM) 50 MG tablet Take 1-2 tablets (50-100 mg total) by mouth every 6 (six) hours as needed for moderate pain or severe pain. Patient not taking: Reported on 01/27/2022 11/01/21   Derl Barrow, PA  Vitamin D,  Ergocalciferol, (DRISDOL) 1.25 MG (50000 UNIT) CAPS capsule Take 1 capsule (50,000 Units total) by mouth every 7 (seven) days. Patient not taking: Reported on 01/27/2022 10/13/21 10/12/22  Claretta Fraise, MD      Allergies    Ciprofloxacin, Procardia [nifedipine], and Norvasc [amlodipine besylate]    Review of Systems   Review of Systems  Constitutional:  Negative for fever.  HENT:  Negative for  congestion and sore throat.   Eyes: Negative.   Respiratory:  Negative for chest tightness and shortness of breath.   Cardiovascular:  Negative for chest pain.  Gastrointestinal:  Positive for diarrhea. Negative for abdominal pain, nausea and vomiting.  Genitourinary: Negative.   Musculoskeletal:  Negative for arthralgias, joint swelling and neck pain.  Skin: Negative.  Negative for rash and wound.  Neurological:  Negative for dizziness, weakness, light-headedness, numbness and headaches.   Physical Exam Updated Vital Signs BP (!) 156/91    Pulse (!) 106    Temp 100.3 F (37.9 C) (Axillary)    Resp 15    LMP 08/21/2013    SpO2 100%  Physical Exam Vitals and nursing note reviewed.  Constitutional:      General: She is not in acute distress.    Appearance: She is well-developed.     Comments: Disheveled appearance.  HENT:     Head: Normocephalic and atraumatic.     Mouth/Throat:     Mouth: Mucous membranes are dry.  Eyes:     Conjunctiva/sclera: Conjunctivae normal.  Cardiovascular:     Rate and Rhythm: Normal rate and regular rhythm.     Heart sounds: Normal heart sounds.  Pulmonary:     Effort: Pulmonary effort is normal.     Breath sounds: Normal breath sounds. No wheezing.  Abdominal:     General: Bowel sounds are normal. There is no distension.     Palpations: Abdomen is soft.     Tenderness: There is no abdominal tenderness. There is no guarding.  Musculoskeletal:        General: No swelling or deformity. Normal range of motion.     Cervical back: Normal range of motion.     Comments: Well healed left knee surgical incision.  No erythema.  Skin:    General: Skin is warm and dry.  Neurological:     Mental Status: She is confused.     Sensory: Sensation is intact.     Motor: Motor function is intact.     Comments: Alert to person only, not time or place.  Knows her husband at bedside. Moving all 4 extremities.    ED Results / Procedures / Treatments   Labs (all  labs ordered are listed, but only abnormal results are displayed) Labs Reviewed  COMPREHENSIVE METABOLIC PANEL - Abnormal; Notable for the following components:      Result Value   Sodium 132 (*)    Potassium 2.5 (*)    CO2 19 (*)    Calcium 7.2 (*)    Total Protein 5.5 (*)    Albumin 2.8 (*)    Total Bilirubin 1.8 (*)    All other components within normal limits  CBC WITH DIFFERENTIAL/PLATELET - Abnormal; Notable for the following components:   RBC 3.10 (*)    Hemoglobin 10.8 (*)    HCT 29.7 (*)    MCH 34.8 (*)    MCHC 36.4 (*)    Platelets 111 (*)    Lymphs Abs 0.5 (*)    All other components within normal limits  URINALYSIS, ROUTINE W REFLEX MICROSCOPIC - Abnormal; Notable for the following components:   Color, Urine AMBER (*)    APPearance CLOUDY (*)    Hgb urine dipstick SMALL (*)    Protein, ur 30 (*)    Bacteria, UA RARE (*)    All other components within normal limits  RESP PANEL BY RT-PCR (FLU A&B, COVID) ARPGX2  MAGNESIUM  RAPID URINE DRUG SCREEN, HOSP PERFORMED  CBG MONITORING, ED    EKG None  Radiology CT Head Wo Contrast  Result Date: 01/27/2022 CLINICAL DATA:  Altered mental status. EXAM: CT HEAD WITHOUT CONTRAST TECHNIQUE: Contiguous axial images were obtained from the base of the skull through the vertex without intravenous contrast. RADIATION DOSE REDUCTION: This exam was performed according to the departmental dose-optimization program which includes automated exposure control, adjustment of the mA and/or kV according to patient size and/or use of iterative reconstruction technique. COMPARISON:  CT head dated June 30, 2020; MRI examination dated May 10, 2021 FINDINGS: Brain: No evidence of acute infarction, hemorrhage, hydrocephalus, extra-axial collection or mass lesion/mass effect. Cerebral atrophy and chronic advanced microvascular ischemic changes of the periventricular white matter. Vascular: No hyperdense vessel or unexpected calcification. Skull:  Normal. Negative for fracture or focal lesion. Sinuses/Orbits: Bilateral cataract surgery. Paranasal sinuses are clear. Other: None. IMPRESSION: 1.  No acute intracranial abnormality. 2. Cerebral atrophy and advanced chronic microvascular ischemic changes of the white matter. Electronically Signed   By: Keane Police D.O.   On: 01/27/2022 16:14   DG Chest Port 1 View  Result Date: 01/27/2022 CLINICAL DATA:  Altered level of consciousness, fell yesterday EXAM: PORTABLE CHEST 1 VIEW COMPARISON:  10/12/2019 FINDINGS: 2 frontal views of the chest demonstrate an unremarkable cardiac silhouette. Stable ectasia of the thoracic aorta. No acute airspace disease, effusion, or pneumothorax. Chronic bilateral pleural and parenchymal scarring, greatest at the apices. No acute bony abnormalities. IMPRESSION: 1. Stable chest, no acute process. Electronically Signed   By: Randa Ngo M.D.   On: 01/27/2022 16:58    Procedures Procedures    Medications Ordered in ED Medications  potassium chloride 10 mEq in 100 mL IVPB (10 mEq Intravenous New Bag/Given 01/27/22 1756)  potassium chloride SA (KLOR-CON M) CR tablet 20 mEq (20 mEq Oral Given 01/27/22 1801)  sodium chloride 0.9 % bolus 1,000 mL (1,000 mLs Intravenous New Bag/Given 01/27/22 1802)    ED Course/ Medical Decision Making/ A&P                           Medical Decision Making Patient with increasing altered mental status and generalized weakness over the past 5 days, diarrhea of unclear etiology but worse since yesterday per husband at bedside.  She is significantly confused at this time with unclear etiology for this confusion.    Problems Addressed: Hypokalemia: acute illness or injury Metabolic encephalopathy: undiagnosed new problem with uncertain prognosis  Amount and/or Complexity of Data Reviewed Labs: ordered.    Details: She does have hypokalemia at 2.5 and hypocalcemia at 7.  2.  Other abnormal labs include a hemoglobin of 10.8, this is a  chronic anemia.  Her UA is cloudy, however no UTI, Radiology: ordered.    Details: CT head is negative for intracranial bleed or other acute abnormality.  CXR negative for pulmonary findings. ECG/medicine tests: ordered.  Risk Decision regarding hospitalization. Risk Details: Pt with suspected metabolic encephalopathy with hypokalemia and hypocalcemia.  She was given IV fluids along with potassium  IV and PO here.  Magnesium also replaced.  Pt will need admission for further replacement of electrolytes and for further management of ongoing confusion.  Discussed with Dr. [facility] who accepts pt for admission.           Final Clinical Impression(s) / ED Diagnoses Final diagnoses:  Hypokalemia  Hypocalcemia  Metabolic encephalopathy    Rx / DC Orders ED Discharge Orders     None         Landis Martins 01/27/22 Melody Haver, MD 01/29/22 [phone]

## 2022-01-27 NOTE — ED Notes (Addendum)
Date and time results received: 01/27/22 3:58 PM    Test: Potassium Critical Value: 2.5  Name of Provider Notified: Evalee Jefferson  Orders Received? Or Actions Taken?: See orders

## 2022-01-27 NOTE — ED Triage Notes (Signed)
Pt bib ems from home with reports of "incoherent"Family reports pt altered. Pt alert to self. Family states pt had a fall yesterday. Strong odor to urine. Pt arrives in 2 gowns. Given 1L NS en route along with Rocephin given at 1336. VSS with ems.

## 2022-01-27 NOTE — ED Notes (Signed)
Pt care taken, cleaned bed, started patient on ct contrast

## 2022-01-27 NOTE — H&P (Signed)
TRH H&P   Patient Demographics:    [name], is a 76 y.o. female  MRN: [id number]   DOB - 1946-09-17  Admit Date - 01/27/2022  Outpatient Primary MD for the patient is Claretta Fraise, MD  Referring MD/NP/PA: PA Cheron Every  Patient coming from: Home  Chief Complaint  Patient presents with   Altered Mental Status      HPI:    [name]  is a 76 y.o. female, with past medical history of hypertension, B12 deficiency, CKD stage II, hypothyroidism, osteoarthritis, patient approximately 2 months out of left total knee arthroplasty, patient was brought by her husband due to altered mental status, history mainly provided by husband, as she remains confused, for last 5 days patient has been having diarrhea, increasing weakness, poor oral intake, as well been having nausea, vomiting, last episode yesterday, diarrhea is copious, nonbloody, vomiting not associated with any blood, she denies any fever or chills, but she was febrile in ED 100.3, she does report some abdominal cramping, denies any previous episodes like that. -In ED patient was noted to be tachycardic at 106, febrile 100.3, work-up significant for severe hypokalemia at 2.5, bicarb low at 19, low at 132, patient started on IV fluids, and [facility] hospitalist consulted to admit.    Review of systems:    In addition to the HPI above,  A full 10 point Review of Systems was done, except as stated above, all other Review of Systems were negative.   With Past History of the following :    Past Medical History:  Diagnosis Date   Acute pyelonephritis 10/15/2019   Acute renal failure superimposed on stage 2 chronic kidney disease ([place]) 10/15/2019   Alcoholism ([place]) 01/09/2012   Anxiety    Arthritis    Left knee   B12 deficiency    Carotid artery narrowing 10/03/2012   On the right.   Chronic kidney disease (CKD), stage II  (mild)    Depression    Headache(784.0)    Hyperlipidemia    Hypertension    Hyponatremia 01/09/2012   Hypothyroid    Menopause    Mini stroke    Orthostatic syncope 10/02/2012   SBO (small bowel obstruction) ([place]) 06/28/2016   Stroke ([place]) 2010   no deficits   Thrombocytopenia ([place]) 10/15/2019      Past Surgical History:  Procedure Laterality Date   AGILE CAPSULE N/A 02/22/2016   dummy capsule remained in the distal ileum   APPENDECTOMY     CATARACT EXTRACTION W/PHACO  06/20/2012   Procedure: CATARACT EXTRACTION PHACO AND INTRAOCULAR LENS PLACEMENT ([place]);  Surgeon: Tonny Branch, MD;  Location: [facility];  Service: Ophthalmology;  Laterality: Right;  CDE:10.66   CATARACT EXTRACTION W/PHACO  07/11/2012   Procedure: CATARACT EXTRACTION PHACO AND INTRAOCULAR LENS PLACEMENT ([facility]);  Surgeon: Tonny Branch, MD;  Location: [facility];  Service: Ophthalmology;  Laterality: Left;  CDE:  12.69   COLONOSCOPY N/A 12/17/2015   RMR: Marked circumferential ulceration of the ascending colon/Ileocecal valve ulceration with biopsy most consistent with NSAID injury versus inflammatory bowel disease, pancolonoc diverticulosis redundant colon   ESOPHAGOGASTRODUODENOSCOPY N/A 12/17/2015   IRS:[id number] gastric polyp, biopsy benign   KNEE ARTHROSCOPY Right 09/2016   LUMBAR LAMINECTOMY/DECOMPRESSION MICRODISCECTOMY N/A 05/25/2017   Procedure: CENTRAL DECOMPRESSIVE LUMBAR LAMINECTOMY FOR SPINAL STENOSIS L3-L4, FORAMINOTOMY FOR THE L4 ROOT AND L5 ROOT;  Surgeon: Latanya Maudlin, MD;  Location: [facility];  Service: Orthopedics;  Laterality: N/A;   TONSILLECTOMY     TOTAL KNEE ARTHROPLASTY Left 10/31/2021   Procedure: TOTAL KNEE ARTHROPLASTY;  Surgeon: Gaynelle Arabian, MD;  Location: [facility];  Service: Orthopedics;  Laterality: Left;      Social History:     Social History   Tobacco Use   Smoking status: Former    Packs/day: 2.00    Years: 30.00    Pack years: 60.00    Types: Cigarettes    Quit date: 06/17/1992    Years  since quitting: 29.6   Smokeless tobacco: Never  Substance Use Topics   Alcohol use: No       Family History :     Family History  Problem Relation Age of Onset   GER disease Mother    Ulcers Mother    Hypertension Maternal Grandmother       Home Medications:   Prior to Admission medications   Medication Sig Start Date End Date Taking? Authorizing Provider  fluticasone (FLONASE) 50 MCG/ACT nasal spray Place 2 sprays into both nostrils daily. Patient taking differently: Place 2 sprays into both nostrils daily as needed for allergies. 07/26/21  Yes Stacks, Cletus Gash, MD  folic acid (FOLVITE) 009 MCG tablet Take 2,400 mcg by mouth daily.   Yes [provider]  levothyroxine (SYNTHROID) 100 MCG tablet Take 1 tablet (100 mcg total) by mouth daily. 09/21/21  Yes Claretta Fraise, MD  alendronate (FOSAMAX) 70 MG tablet Take 1 tablet (70 mg total) by mouth once a week. Take with a full glass of water on an empty stomach. Patient not taking: Reported on 01/27/2022 01/26/21   Claretta Fraise, MD  fluconazole (DIFLUCAN) 100 MG tablet Take two with first dose. Then starting the next day take one daily until all are taken. Patient not taking: Reported on 01/27/2022 07/29/21   Claretta Fraise, MD  methocarbamol (ROBAXIN) 500 MG tablet Take 1 tablet (500 mg total) by mouth every 6 (six) hours as needed for muscle spasms. Patient not taking: Reported on 01/27/2022 11/01/21   Theresa Duty L, PA  metoprolol succinate (TOPROL-XL) 100 MG 24 hr tablet Take 1 tablet (100 mg total) by mouth daily. For blood pressure control Patient not taking: Reported on 01/27/2022 09/21/21   Claretta Fraise, MD  olmesartan (BENICAR) 20 MG tablet Take 1 tablet by mouth once daily Patient not taking: Reported on 01/27/2022 09/21/21   Claretta Fraise, MD  olmesartan (BENICAR) 40 MG tablet Take 40 mg by mouth daily. Patient not taking: Reported on 01/27/2022 08/31/21   [provider]  pregabalin (LYRICA) 200 MG  capsule Take 1 capsule (200 mg total) by mouth daily. Patient not taking: Reported on 01/27/2022 09/21/21   Claretta Fraise, MD  rosuvastatin (CRESTOR) 10 MG tablet TAKE 1 TABLET EVERY DAY Patient not taking: Reported on 01/27/2022 06/14/21   Claretta Fraise, MD  traMADol (ULTRAM) 50 MG tablet Take 1-2 tablets (50-100 mg total) by mouth every 6 (six) hours as needed for moderate pain  or severe pain. Patient not taking: Reported on 01/27/2022 11/01/21   Edmisten, Ok Anis, PA  Vitamin D, Ergocalciferol, (DRISDOL) 1.25 MG (50000 UNIT) CAPS capsule Take 1 capsule (50,000 Units total) by mouth every 7 (seven) days. Patient not taking: Reported on 01/27/2022 10/13/21 10/12/22  Claretta Fraise, MD     Allergies:     Allergies  Allergen Reactions   Ciprofloxacin Other (See Comments)    Caused pt to have delusional thoughts and hallucinations   Procardia [Nifedipine] Swelling    Edema     Norvasc [Amlodipine Besylate] Hives     Physical Exam:   Vitals  Blood pressure (!) 156/91, pulse (!) 106, temperature 100.3 F (37.9 C), temperature source Axillary, resp. rate 15, last menstrual period 08/21/2013, SpO2 100 %.   1. General elderly female, frail, deconditioned laying in bed in mild discomfort  2.  Confused, impaired judgment and insight, oriented x2   3. No F.N deficits, ALL C.Nerves Intact, Strength 5/5 all 4 extremities, Sensation intact all 4 extremities, Plantars down going.  4. Ears and Eyes appear Normal, Conjunctivae clear, PERRLA. Dry Oral Mucosa.  5. Supple Neck, No JVD, No cervical lymphadenopathy appriciated, No Carotid Bruits.  6. Symmetrical Chest wall movement, Good air movement bilaterally, CTAB.  7. Tachycardic, No Gallops, Rubs or Murmurs, No Parasternal Heave.  8. Positive Bowel Sounds, Abdomen Soft, No tenderness, No organomegaly appriciated,No rebound -guarding or rigidity.  9.  No Cyanosis, poor  Skin Turgor, No Skin Rash or Bruise.  10. Good muscle tone,  joints  appear normal , no effusions, Normal ROM.  11. No Palpable Lymph Nodes in Neck or Axillae    Data Review:    CBC Recent Labs  Lab 01/27/22 1511  WBC 5.6  HGB 10.8*  HCT 29.7*  PLT 111*  MCV 95.8  MCH 34.8*  MCHC 36.4*  RDW 12.8  LYMPHSABS 0.5*  MONOABS 0.5  EOSABS 0.2  BASOSABS 0.0   ------------------------------------------------------------------------------------------------------------------  Chemistries  Recent Labs  Lab 01/27/22 1511 01/27/22 1657  NA 132*  --   K 2.5*  --   CL 102  --   CO2 19*  --   GLUCOSE 86  --   BUN 10  --   CREATININE 0.97  --   CALCIUM 7.2*  --   MG  --  1.8  AST 36  --   ALT 20  --   ALKPHOS 102  --   BILITOT 1.8*  --    ------------------------------------------------------------------------------------------------------------------ CrCl cannot be calculated (Unknown ideal weight.). ------------------------------------------------------------------------------------------------------------------ No results for input(s): TSH, T4TOTAL, T3FREE, THYROIDAB in the last 72 hours.  Invalid input(s): FREET3  Coagulation profile No results for input(s): INR, PROTIME in the last 168 hours. ------------------------------------------------------------------------------------------------------------------- No results for input(s): DDIMER in the last 72 hours. -------------------------------------------------------------------------------------------------------------------  Cardiac Enzymes No results for input(s): CKMB, TROPONINI, MYOGLOBIN in the last 168 hours.  Invalid input(s): CK ------------------------------------------------------------------------------------------------------------------ No results found for: BNP   ---------------------------------------------------------------------------------------------------------------  Urinalysis    Component Value Date/Time   COLORURINE AMBER (A) 01/27/2022 1536    APPEARANCEUR CLOUDY (A) 01/27/2022 1536   APPEARANCEUR Clear 08/23/2021 1353   LABSPEC 1.009 01/27/2022 1536   PHURINE 8.0 01/27/2022 1536   GLUCOSEU NEGATIVE 01/27/2022 1536   HGBUR SMALL (A) 01/27/2022 1536   BILIRUBINUR NEGATIVE 01/27/2022 1536   BILIRUBINUR Negative 08/23/2021 1353   [place] 01/27/2022 1536   PROTEINUR 30 (A) 01/27/2022 1536   UROBILINOGEN negative 09/10/2015 1624   UROBILINOGEN 0.2 04/30/2015 0346  NITRITE NEGATIVE 01/27/2022 [place] 01/27/2022 1536    ----------------------------------------------------------------------------------------------------------------   Imaging Results:    CT Head Wo Contrast  Result Date: 01/27/2022 CLINICAL DATA:  Altered mental status. EXAM: CT HEAD WITHOUT CONTRAST TECHNIQUE: Contiguous axial images were obtained from the base of the skull through the vertex without intravenous contrast. RADIATION DOSE REDUCTION: This exam was performed according to the departmental dose-optimization program which includes automated exposure control, adjustment of the mA and/or kV according to patient size and/or use of iterative reconstruction technique. COMPARISON:  CT head dated June 30, 2020; MRI examination dated May 10, 2021 FINDINGS: Brain: No evidence of acute infarction, hemorrhage, hydrocephalus, extra-axial collection or mass lesion/mass effect. Cerebral atrophy and chronic advanced microvascular ischemic changes of the periventricular white matter. Vascular: No hyperdense vessel or unexpected calcification. Skull: Normal. Negative for fracture or focal lesion. Sinuses/Orbits: Bilateral cataract surgery. Paranasal sinuses are clear. Other: None. IMPRESSION: 1.  No acute intracranial abnormality. 2. Cerebral atrophy and advanced chronic microvascular ischemic changes of the white matter. Electronically Signed   By: Keane Police D.O.   On: 01/27/2022 16:14   DG Chest Port 1 View  Result Date: 01/27/2022 CLINICAL  DATA:  Altered level of consciousness, fell yesterday EXAM: PORTABLE CHEST 1 VIEW COMPARISON:  10/12/2019 FINDINGS: 2 frontal views of the chest demonstrate an unremarkable cardiac silhouette. Stable ectasia of the thoracic aorta. No acute airspace disease, effusion, or pneumothorax. Chronic bilateral pleural and parenchymal scarring, greatest at the apices. No acute bony abnormalities. IMPRESSION: 1. Stable chest, no acute process. Electronically Signed   By: Randa Ngo M.D.   On: 01/27/2022 16:58    My personal review of EKG: Rhythm NSR, Rate  92 /min, QTc 479    Assessment & Plan:    Principal Problem:   Hypokalemia Active Problems:   Nausea vomiting and diarrhea   Acute metabolic encephalopathy   Chronic kidney disease (CKD), stage II (mild)   Hypothyroidism   Essential hypertension   Hyperlipidemia   B12 deficiency   Carotid artery narrowing  Nausea/vomiting/diarrhea -Findings most likely due to infectious process, given she has fever of 100.3, I will obtain CT abdomen/pelvis with IV contrast, will hold on initiating any antibiotics pending CT findings,. -Continue with IV hydration. -We will start on full liquid diet and advance as tolerated. -We will check GI pathogen panel and C. Difficile  Fever -Negative UA, chest x-ray with no infection, follow on CT abdomen and pelvis, obtain blood cultures.  Acute metabolic encephalopathy -CT head with no acute findings, most likely due to above  Hypokalemia -Severe at 2.5, continue with p.o. and IV supplement, will give IV magnesium as well  History of B12 deficiency -Does not appear to be any morning supplement, will check level.  Hypothyroidism -continue with Synthroid  Pretension -Patient apparently stopped taking her blood pressure medication, will resume on Toprol-XL lower dose, and if blood pressure remains uncontrolled will increase dose and add losartan.  Hyponatremia -Volume depletion, continue with normal saline    Metabolic acidosis -Bicarb low at 19, due to diarrhea, this should correct with IV fluids.  DVT Prophylaxis Heparin   AM Labs Ordered, also please review Full Orders  Family Communication: Admission, patients condition and plan of care including tests being ordered have been discussed with the patient and husband at bedside who indicate understanding and agree with the plan and Code Status.  Code Status full  Likely DC to  home  Condition GUARDED    Consults  called: none    Admission status: observation    Time spent in minutes : 70 minutes   Phillips Climes M.D on 01/27/2022 at 6:49 PM   [facility] - Office  [phone]

## 2022-01-28 ENCOUNTER — Encounter (HOSPITAL_COMMUNITY): Payer: Self-pay | Admitting: Internal Medicine

## 2022-01-28 ENCOUNTER — Observation Stay (HOSPITAL_COMMUNITY): Payer: Medicare HMO

## 2022-01-28 ENCOUNTER — Other Ambulatory Visit: Payer: Self-pay

## 2022-01-28 DIAGNOSIS — E039 Hypothyroidism, unspecified: Secondary | ICD-10-CM | POA: Diagnosis present

## 2022-01-28 DIAGNOSIS — E871 Hypo-osmolality and hyponatremia: Secondary | ICD-10-CM | POA: Diagnosis present

## 2022-01-28 DIAGNOSIS — Z79899 Other long term (current) drug therapy: Secondary | ICD-10-CM | POA: Diagnosis not present

## 2022-01-28 DIAGNOSIS — E785 Hyperlipidemia, unspecified: Secondary | ICD-10-CM | POA: Diagnosis present

## 2022-01-28 DIAGNOSIS — Z888 Allergy status to other drugs, medicaments and biological substances status: Secondary | ICD-10-CM | POA: Diagnosis not present

## 2022-01-28 DIAGNOSIS — I129 Hypertensive chronic kidney disease with stage 1 through stage 4 chronic kidney disease, or unspecified chronic kidney disease: Secondary | ICD-10-CM | POA: Diagnosis present

## 2022-01-28 DIAGNOSIS — R64 Cachexia: Secondary | ICD-10-CM | POA: Diagnosis present

## 2022-01-28 DIAGNOSIS — A0472 Enterocolitis due to Clostridium difficile, not specified as recurrent: Principal | ICD-10-CM

## 2022-01-28 DIAGNOSIS — K8020 Calculus of gallbladder without cholecystitis without obstruction: Secondary | ICD-10-CM

## 2022-01-28 DIAGNOSIS — N182 Chronic kidney disease, stage 2 (mild): Secondary | ICD-10-CM | POA: Diagnosis present

## 2022-01-28 DIAGNOSIS — Z79890 Hormone replacement therapy (postmenopausal): Secondary | ICD-10-CM | POA: Diagnosis not present

## 2022-01-28 DIAGNOSIS — Z8249 Family history of ischemic heart disease and other diseases of the circulatory system: Secondary | ICD-10-CM | POA: Diagnosis not present

## 2022-01-28 DIAGNOSIS — E872 Acidosis, unspecified: Secondary | ICD-10-CM | POA: Diagnosis present

## 2022-01-28 DIAGNOSIS — E538 Deficiency of other specified B group vitamins: Secondary | ICD-10-CM

## 2022-01-28 DIAGNOSIS — G9341 Metabolic encephalopathy: Secondary | ICD-10-CM | POA: Diagnosis present

## 2022-01-28 DIAGNOSIS — E162 Hypoglycemia, unspecified: Secondary | ICD-10-CM | POA: Diagnosis present

## 2022-01-28 DIAGNOSIS — I10 Essential (primary) hypertension: Secondary | ICD-10-CM | POA: Diagnosis not present

## 2022-01-28 DIAGNOSIS — Z8673 Personal history of transient ischemic attack (TIA), and cerebral infarction without residual deficits: Secondary | ICD-10-CM | POA: Diagnosis not present

## 2022-01-28 DIAGNOSIS — Z87891 Personal history of nicotine dependence: Secondary | ICD-10-CM | POA: Diagnosis not present

## 2022-01-28 DIAGNOSIS — Z881 Allergy status to other antibiotic agents status: Secondary | ICD-10-CM | POA: Diagnosis not present

## 2022-01-28 DIAGNOSIS — E876 Hypokalemia: Secondary | ICD-10-CM | POA: Diagnosis present

## 2022-01-28 DIAGNOSIS — R54 Age-related physical debility: Secondary | ICD-10-CM | POA: Diagnosis present

## 2022-01-28 DIAGNOSIS — Z20822 Contact with and (suspected) exposure to covid-19: Secondary | ICD-10-CM | POA: Diagnosis present

## 2022-01-28 DIAGNOSIS — Z681 Body mass index (BMI) 19 or less, adult: Secondary | ICD-10-CM | POA: Diagnosis not present

## 2022-01-28 LAB — FERRITIN: Ferritin: 292 ng/mL (ref 11–307)

## 2022-01-28 LAB — VITAMIN B12: Vitamin B-12: 465 pg/mL (ref 180–914)

## 2022-01-28 LAB — CBC
HCT: 33.2 % — ABNORMAL LOW (ref 36.0–46.0)
Hemoglobin: 11 g/dL — ABNORMAL LOW (ref 12.0–15.0)
MCH: 32.1 pg (ref 26.0–34.0)
MCHC: 33.1 g/dL (ref 30.0–36.0)
MCV: 96.8 fL (ref 80.0–100.0)
Platelets: 137 10*3/uL — ABNORMAL LOW (ref 150–400)
RBC: 3.43 MIL/uL — ABNORMAL LOW (ref 3.87–5.11)
RDW: 12.9 % (ref 11.5–15.5)
WBC: 7.2 10*3/uL (ref 4.0–10.5)
nRBC: 0 % (ref 0.0–0.2)

## 2022-01-28 LAB — FOLATE: Folate: 25 ng/mL (ref >5.9)

## 2022-01-28 LAB — BASIC METABOLIC PANEL
Anion gap: 12 (ref 5–15)
BUN: 8 mg/dL (ref 8–23)
CO2: 17 mmol/L — ABNORMAL LOW (ref 22–32)
Calcium: 7.1 mg/dL — ABNORMAL LOW (ref 8.9–10.3)
Chloride: 101 mmol/L (ref 98–111)
Creatinine, Ser: 0.84 mg/dL (ref 0.44–1.00)
GFR, Estimated: >60 mL/min (ref >60)
Glucose, Bld: 59 mg/dL — ABNORMAL LOW (ref 70–99)
Potassium: 3.3 mmol/L — ABNORMAL LOW (ref 3.5–5.1)
Sodium: 130 mmol/L — ABNORMAL LOW (ref 135–145)

## 2022-01-28 LAB — C DIFFICILE QUICK SCREEN W PCR REFLEX
C Diff antigen: POSITIVE — AB
C Diff toxin: NEGATIVE

## 2022-01-28 LAB — RETICULOCYTES
Immature Retic Fract: 18.8 % — ABNORMAL HIGH (ref 2.3–15.9)
RBC.: 3.26 MIL/uL — ABNORMAL LOW (ref 3.87–5.11)
Retic Count, Absolute: 51.5 10*3/uL (ref 19.0–186.0)
Retic Ct Pct: 1.6 % (ref 0.4–3.1)

## 2022-01-28 LAB — CLOSTRIDIUM DIFFICILE BY PCR, REFLEXED: Toxigenic C. Difficile by PCR: POSITIVE — AB

## 2022-01-28 LAB — IRON AND TIBC
Iron: 71 ug/dL (ref 28–170)
TIBC: <70 ug/dL (ref 250–450)

## 2022-01-28 LAB — GLUCOSE, CAPILLARY
Glucose-Capillary: 60 mg/dL — ABNORMAL LOW (ref 70–99)
Glucose-Capillary: 98 mg/dL (ref 70–99)
Glucose-Capillary: 89 mg/dL (ref 70–99)

## 2022-01-28 LAB — MAGNESIUM: Magnesium: 2 mg/dL (ref 1.7–2.4)

## 2022-01-28 MED ORDER — ENSURE ENLIVE PO LIQD
237.0000 mL | Freq: Two times a day (BID) | ORAL | Status: DC
  Administered 2022-01-28 – 2022-01-29 (×3): 237 mL via ORAL

## 2022-01-28 MED ORDER — POTASSIUM CHLORIDE IN NACL 20-0.9 MEQ/L-% IV SOLN: INTRAVENOUS | Status: DC

## 2022-01-28 MED ORDER — PIPERACILLIN-TAZOBACTAM 3.375 G IVPB
3.3750 g | Freq: Three times a day (TID) | INTRAVENOUS | Status: DC
Start: 2022-01-28 — End: 2022-01-28
  Administered 2022-01-28: 3.375 g via INTRAVENOUS
  Filled 2022-01-28: qty 50

## 2022-01-28 MED ORDER — VANCOMYCIN HCL 125 MG PO CAPS
125.0000 mg | ORAL_CAPSULE | Freq: Four times a day (QID) | ORAL | Status: DC
  Administered 2022-01-28 – 2022-01-29 (×4): 125 mg via ORAL
  Filled 2022-01-28 (×9): qty 1

## 2022-01-28 MED ORDER — SACCHAROMYCES BOULARDII 250 MG PO CAPS
250.0000 mg | ORAL_CAPSULE | Freq: Two times a day (BID) | ORAL | Status: DC
  Administered 2022-01-28 – 2022-01-29 (×3): 250 mg via ORAL
  Filled 2022-01-28 (×3): qty 1

## 2022-01-28 MED ORDER — KCL IN DEXTROSE-NACL 20-5-0.9 MEQ/L-%-% IV SOLN: INTRAVENOUS | Status: DC

## 2022-01-28 MED ORDER — LORAZEPAM 2 MG/ML IJ SOLN
1.0000 mg | Freq: Once | INTRAMUSCULAR | Status: AC
  Administered 2022-01-28: 1 mg via INTRAVENOUS
  Filled 2022-01-28: qty 1

## 2022-01-28 NOTE — Assessment & Plan Note (Addendum)
Starting oral vancomycin x 10 days 01/28/22  Enteric precautions in place   DC home to complete oral vancomycin at home.

## 2022-01-28 NOTE — Assessment & Plan Note (Addendum)
Pt was restarted on metoprolol XL 50 mg (apparently had not been taking it at home) New Rx given.

## 2022-01-28 NOTE — Assessment & Plan Note (Addendum)
RESOLVED with supportive measures.

## 2022-01-28 NOTE — Assessment & Plan Note (Signed)
Resolving with supportive measures.

## 2022-01-28 NOTE — Assessment & Plan Note (Signed)
Anemia panel pending.

## 2022-01-28 NOTE — Assessment & Plan Note (Addendum)
From poor oral intake.  Monitored closely in hospital and resolved.  Ensure supplements given.

## 2022-01-28 NOTE — Assessment & Plan Note (Addendum)
Oral and IV replacement given and REPLETED

## 2022-01-28 NOTE — Hospital Course (Addendum)
76 y.o. female, with past medical history of hypertension, B12 deficiency, CKD stage II, hypothyroidism, osteoarthritis, patient approximately 2 months out of left total knee arthroplasty, patient was brought by her husband due to altered mental status, history mainly provided by husband, as she remains confused, for last 5 days patient has been having diarrhea, increasing weakness, poor oral intake, as well been having nausea, vomiting, last episode yesterday, diarrhea is copious, nonbloody, vomiting not associated with any blood, she denies any fever or chills, but she was febrile in ED 100.3, she does report some abdominal cramping, denies any previous episodes like that. -In ED patient was noted to be tachycardic at 106, febrile 100.3, work-up significant for severe hypokalemia at 2.5, bicarb low at 19, low at 132, patient started on IV fluids, and [facility] hospitalist consulted to admit.  CT scan suggesting colitis and cholelithiasis.    01/28/2022:  pt reports that loose stools have started to improve and abdominal pain improved. Tolerating full liquid diet. Advance diet as tolerated orders placed.  Dietitian consulted.  RUQ abd [place] ordered.   Update:  C diff tested positive.  Starting oral vancomycin.    01/29/2022:  Pt eating and drinking.  Diarrhea has resolved.  She is tolerating oral vancomycin tablets.  PT evaluated and recommended home health and bedside commode.  Husband agreeable to take home and care for her 24/7.  Son Saralyn Pilar called and agreeable to check in on them frequently for next several days.  Social worker was consulted to investigate home situation and assured me that patient can safely discharge home with husband and son lives close to them and will check up frequently.

## 2022-01-28 NOTE — Assessment & Plan Note (Addendum)
Suspect from dehydration Treated with IV fluid Na up to 133

## 2022-01-28 NOTE — Assessment & Plan Note (Signed)
Not currently on meds.

## 2022-01-28 NOTE — Assessment & Plan Note (Addendum)
continue on home levothyroxine  Follow up with PCP

## 2022-01-28 NOTE — Assessment & Plan Note (Signed)
Intolerance to cipro, started IV zosyn Continue supportive measures

## 2022-01-28 NOTE — Progress Notes (Signed)
PROGRESS NOTE   [name]  JJH:[id number] DOB: 22-Jan-1946 DOA: 01/27/2022 PCP: Claretta Fraise, MD   Chief Complaint  Patient presents with   Altered Mental Status   Level of care: Med-Surg  Brief Admission History:  76 y.o. female, with past medical history of hypertension, B12 deficiency, CKD stage II, hypothyroidism, osteoarthritis, patient approximately 2 months out of left total knee arthroplasty, patient was brought by her husband due to altered mental status, history mainly provided by husband, as she remains confused, for last 5 days patient has been having diarrhea, increasing weakness, poor oral intake, as well been having nausea, vomiting, last episode yesterday, diarrhea is copious, nonbloody, vomiting not associated with any blood, she denies any fever or chills, but she was febrile in ED 100.3, she does report some abdominal cramping, denies any previous episodes like that. -In ED patient was noted to be tachycardic at 106, febrile 100.3, work-up significant for severe hypokalemia at 2.5, bicarb low at 19, low at 132, patient started on IV fluids, and [facility] hospitalist consulted to admit.  CT scan suggesting colitis and cholelithiasis.    01/28/2022:  pt reports that loose stools have started to improve and abdominal pain improved. Tolerating full liquid diet. Advance diet as tolerated orders placed.  Dietitian consulted.  RUQ abd [place] ordered.   Update:  C diff tested positive.  Starting oral vancomycin.     Assessment and Plan: * C. difficile colitis- (present on admission) Starting oral vancomycin x 10 days 01/28/22  Enteric precautions in place    Hypoglycemia- (present on admission) Likely from poor oral intake.  Monitor closely Added dextrose to IV fluid Advance diet as tolerated  Dietitian consultation requested.   Acute metabolic encephalopathy- (present on admission) Resolving with supportive measures.   Nausea vomiting and diarrhea- (present on  admission) Slowly improving with supportive measures Advance diet as tolerated.   Hypokalemia- (present on admission) IV replacement given and REPLETED Recheck K and Mg in AM   Chronic kidney disease (CKD), stage II (mild)- (present on admission) Renal function appears to be stable at this time.   Hypothyroidism- (present on admission) Restarted on home levothyroxine   Hyperlipidemia- (present on admission) Not currently on meds.    Essential hypertension- (present on admission) Pt was restarted on metoprolol XL 50 mg (apparently had not been taking it at home)  B12 deficiency- (present on admission) Anemia panel pending.   Cholelithiases- (present on admission) Check RUQ [place] to rule out acute cholecystitis   Hyponatremia- (present on admission) Suspect from dehydration Treating with IV fluid Recheck BMP in AM.   DVT prophylaxis: sq heparin  Code Status: Full  Family Communication:  Disposition: Status is: Inpatient Remains inpatient appropriate because: IV fluids needed.   Consultants:   Procedures:   Antimicrobials:  Vancomycin oral 2/18>>  Subjective: PT reports feeling weak and poor appetite but diarrhea seems to be slowing down.   Objective: Vitals:   01/28/22 0443 01/28/22 0800 01/28/22 1000 01/28/22 1400  BP: (!) 148/86  140/73 112/69  Pulse: 72  67 65  Resp: 18  16 16   Temp: 97.8 F (36.6 C)  98.6 F (37 C) 98.8 F (37.1 C)  TempSrc:    Oral  SpO2: 100%  100% 100%  Weight:  54.6 kg    Height:  5\' 9"  (1.753 m)      Intake/Output Summary (Last 24 hours) at 01/28/2022 1440 Last data filed at 01/28/2022 0600 Gross per 24 hour  Intake 990 ml  Output  200 ml  Net 790 ml   Filed Weights   01/28/22 0800  Weight: 54.6 kg   Examination:  General exam: thin emaciated appearing female, Appears calm and comfortable  Respiratory system: Clear to auscultation. Respiratory effort normal. Cardiovascular system: normal S1 & S2 heard. No JVD, murmurs,  rubs, gallops or clicks. No pedal edema. Gastrointestinal system: Abdomen is nondistended, soft and nontender. No organomegaly or masses felt. Normal bowel sounds heard. Central nervous system: Alert and oriented. No focal neurological deficits. Extremities: Symmetric 5 x 5 power. Skin: No rashes, lesions or ulcers. Psychiatry: Judgement and insight appear normal. Mood & affect appropriate.   Data Reviewed: I have personally reviewed following labs and imaging studies  CBC: Recent Labs  Lab 01/27/22 1511 01/28/22 0614  WBC 5.6 7.2  NEUTROABS 4.5  --   HGB 10.8* 11.0*  HCT 29.7* 33.2*  MCV 95.8 96.8  PLT 111* 137*    Basic Metabolic Panel: Recent Labs  Lab 01/27/22 1511 01/27/22 1657 01/28/22 0614  NA 132*  --  130*  K 2.5*  --  3.3*  CL 102  --  101  CO2 19*  --  17*  GLUCOSE 86  --  59*  BUN 10  --  8  CREATININE 0.97  --  0.84  CALCIUM 7.2*  --  7.1*  MG  --  1.8 2.0    CBG: Recent Labs  Lab 01/27/22 1532 01/28/22 1216  GLUCAP 92 60*    Recent Results (from the past 240 hour(s))  Resp Panel by RT-PCR (Flu A&B, Covid) Nasopharyngeal Swab     Status: None   Collection Time: 01/27/22  3:32 PM   Specimen: Nasopharyngeal Swab; Nasopharyngeal(NP) swabs in vial transport medium  Result Value Ref Range Status   SARS Coronavirus 2 by RT PCR NEGATIVE NEGATIVE Final    Comment: (NOTE) SARS-CoV-2 target nucleic acids are NOT DETECTED.  The SARS-CoV-2 RNA is generally detectable in upper respiratory specimens during the acute phase of infection. The lowest concentration of SARS-CoV-2 viral copies this assay can detect is 138 copies/mL. A negative result does not preclude SARS-Cov-2 infection and should not be used as the sole basis for treatment or other patient management decisions. A negative result may occur with  improper specimen collection/handling, submission of specimen other than nasopharyngeal swab, presence of viral mutation(s) within the areas targeted  by this assay, and inadequate number of viral copies(<138 copies/mL). A negative result must be combined with clinical observations, patient history, and epidemiological information. The expected result is Negative.  Fact Sheet for Patients:  EntrepreneurPulse.com.au  Fact Sheet for Healthcare Providers:  IncredibleEmployment.be  This test is no t yet approved or cleared by the [place] FDA and  has been authorized for detection and/or diagnosis of SARS-CoV-2 by FDA under an Emergency Use Authorization (EUA). This EUA will remain  in effect (meaning this test can be used) for the duration of the COVID-19 declaration under Section 564(b)(1) of the Act, 21 U.S.C.section 360bbb-3(b)(1), unless the authorization is terminated  or revoked sooner.       Influenza A by PCR NEGATIVE NEGATIVE Final   Influenza B by PCR NEGATIVE NEGATIVE Final    Comment: (NOTE) The Xpert Xpress SARS-CoV-2/FLU/RSV plus assay is intended as an aid in the diagnosis of influenza from Nasopharyngeal swab specimens and should not be used as a sole basis for treatment. Nasal washings and aspirates are unacceptable for Xpert Xpress SARS-CoV-2/FLU/RSV testing.  Fact Sheet for Patients: EntrepreneurPulse.com.au  Fact  Sheet for Healthcare Providers: IncredibleEmployment.be  This test is not yet approved or cleared by the [place] and has been authorized for detection and/or diagnosis of SARS-CoV-2 by FDA under an Emergency Use Authorization (EUA). This EUA will remain in effect (meaning this test can be used) for the duration of the COVID-19 declaration under Section 564(b)(1) of the Act, 21 U.S.C. section 360bbb-3(b)(1), unless the authorization is terminated or revoked.  Performed at [facility], [address], [place], [place] [address]   Culture, blood (routine x 2)     Status: None (Preliminary result)   Collection  Time: 01/27/22  7:16 PM   Specimen: Right Antecubital; Blood  Result Value Ref Range Status   Specimen Description   Final    RIGHT ANTECUBITAL BOTTLES DRAWN AEROBIC AND ANAEROBIC   Special Requests Blood Culture adequate volume  Final   Culture   Final    NO GROWTH < 24 HOURS Performed at [facility], [address] [place], [place] [address]    Report Status PENDING  Incomplete  Culture, blood (routine x 2)     Status: None (Preliminary result)   Collection Time: 01/27/22  7:22 PM   Specimen: BLOOD RIGHT HAND  Result Value Ref Range Status   Specimen Description   Final    BLOOD RIGHT HAND BOTTLES DRAWN AEROBIC AND ANAEROBIC   Special Requests Blood Culture adequate volume  Final   Culture   Final    NO GROWTH < 24 HOURS Performed at [facility], [address] [place], [place] [address]    Report Status PENDING  Incomplete  C Difficile Quick Screen w PCR reflex     Status: Abnormal   Collection Time: 01/28/22 12:23 AM   Specimen: Stool  Result Value Ref Range Status   C Diff antigen POSITIVE (A) NEGATIVE Final   C Diff toxin NEGATIVE NEGATIVE Final   C Diff interpretation Results are indeterminate. See PCR results.  Final    Comment: Performed at [facility], [address], [place], [place] [address]  C. Diff by PCR, Reflexed     Status: Abnormal   Collection Time: 01/28/22 12:23 AM  Result Value Ref Range Status   Toxigenic C. Difficile by PCR POSITIVE (A) NEGATIVE Final    Comment: Positive for toxigenic C. difficile with little to no toxin production. Only treat if clinical presentation suggests symptomatic illness. Performed at [facility], [place] [address], [place],  [address]      Radiology Studies: CT Head Wo Contrast  Result Date: 01/27/2022 CLINICAL DATA:  Altered mental status. EXAM: CT HEAD WITHOUT CONTRAST TECHNIQUE: Contiguous axial images were obtained from the base of the skull through the vertex without intravenous contrast. RADIATION DOSE  REDUCTION: This exam was performed according to the departmental dose-optimization program which includes automated exposure control, adjustment of the mA and/or kV according to patient size and/or use of iterative reconstruction technique. COMPARISON:  CT head dated June 30, 2020; MRI examination dated May 10, 2021 FINDINGS: Brain: No evidence of acute infarction, hemorrhage, hydrocephalus, extra-axial collection or mass lesion/mass effect. Cerebral atrophy and chronic advanced microvascular ischemic changes of the periventricular white matter. Vascular: No hyperdense vessel or unexpected calcification. Skull: Normal. Negative for fracture or focal lesion. Sinuses/Orbits: Bilateral cataract surgery. Paranasal sinuses are clear. Other: None. IMPRESSION: 1.  No acute intracranial abnormality. 2. Cerebral atrophy and advanced chronic microvascular ischemic changes of the white matter. Electronically Signed   By: Judye Bos.O.  On: 01/27/2022 16:14   CT ABDOMEN PELVIS W CONTRAST  Result Date: 01/27/2022 CLINICAL DATA:  Diarrhea, nausea and vomiting. Fever. Total knee arthroplasty 2 months ago. EXAM: CT ABDOMEN AND PELVIS WITH CONTRAST TECHNIQUE: Multidetector CT imaging of the abdomen and pelvis was performed using the standard protocol following bolus administration of intravenous contrast. RADIATION DOSE REDUCTION: This exam was performed according to the departmental dose-optimization program which includes automated exposure control, adjustment of the mA and/or kV according to patient size and/or use of iterative reconstruction technique. CONTRAST:  173mL OMNIPAQUE IOHEXOL 300 MG/ML  SOLN COMPARISON:  10/12/2019 FINDINGS: Lower chest: Small effusions layering dependently with minimal dependent atelectasis. Hepatobiliary: Benign hemangioma within the right lobe of the liver as seen previously, 1.7 cm in diameter. There are small gallstones in the gallbladder. No definite cholecystitis. No visible  choledocholithiasis. Pancreas: Normal.  Duodenal diverticulum incidentally noted. Spleen: Normal Adrenals/Urinary Tract: Adrenal glands are normal. Kidneys are normal. No cyst, mass, stone or hydronephrosis. No evidence of pyelonephritis as was seen in 2020. Bladder is normal. Stomach/Bowel: Stomach is normal. No small bowel pathology is seen. There appears to be wall thickening of the colon, particularly of the right colon, which could go along with colitis. No evidence of obstruction. No evidence of diverticulitis. Vascular/Lymphatic: Aortic atherosclerosis. No aneurysm. IVC is normal. No adenopathy. Reproductive: No pelvic mass. Other: Small amount of ascites. Musculoskeletal: Previously augmented compression fracture at L3. Old compression fractures at T12, L1 and L2. No evidence of acute fracture. IMPRESSION: Small effusions layering dependently with minimal dependent atelectasis at the lung bases. Cholelithiasis. Gallbladder is full but there is no definite CT evidence of cholecystitis or biliary obstruction. If there is clinical concern regarding possible cholecystitis or choledocholithiasis, consider ultrasound and/or nuclear medicine hepatobiliary scan. Mild wall thickening of the colon, particularly the right colon, suggesting possible colitis. No evidence of diverticulitis. No evidence of bowel obstruction. Small amount of ascites, freely distributed. Aortic Atherosclerosis (ICD10-I70.0). Electronically Signed   By: Nelson Chimes M.D.   On: 01/27/2022 21:24   DG Chest Port 1 View  Result Date: 01/27/2022 CLINICAL DATA:  Altered level of consciousness, fell yesterday EXAM: PORTABLE CHEST 1 VIEW COMPARISON:  10/12/2019 FINDINGS: 2 frontal views of the chest demonstrate an unremarkable cardiac silhouette. Stable ectasia of the thoracic aorta. No acute airspace disease, effusion, or pneumothorax. Chronic bilateral pleural and parenchymal scarring, greatest at the apices. No acute bony abnormalities.  IMPRESSION: 1. Stable chest, no acute process. Electronically Signed   By: Randa Ngo M.D.   On: 01/27/2022 16:58   US Abdomen Limited RUQ (LIVER/GB)  Result Date: 01/28/2022 CLINICAL DATA:  Cholelithiasis EXAM: ULTRASOUND ABDOMEN LIMITED RIGHT UPPER QUADRANT COMPARISON:  Abdominal ultrasound 10/18/2012 FINDINGS: Gallbladder: Contains moderate amount of sludge and echogenic small stones. No significant wall thickening or pericholecystic fluid. No sonographic Murphy sign. Common bile duct: Diameter: 4 mm Liver: Coarse, increased echogenicity of the parenchyma with no suspicious mass identified. Portal vein is patent on color Doppler imaging with normal direction of blood flow towards the liver. Other: None. IMPRESSION: 1. Cholelithiasis and sludge. 2. Increased echogenicity of the liver parenchyma which may represent hepatic steatosis and/or other hepatocellular disease. Electronically Signed   By: Ofilia Neas M.D.   On: 01/28/2022 11:23    Scheduled Meds:  feeding supplement  237 mL Oral BID BM   folic acid  1,914 mcg Oral Daily   heparin  5,000 Units Subcutaneous Q8H   levothyroxine  100 mcg Oral Daily   metoprolol  succinate  50 mg Oral Daily   saccharomyces boulardii  250 mg Oral BID   vancomycin  125 mg Oral QID   Continuous Infusions:  dextrose 5 % and 0.9 % NaCl with KCl 20 mEq/L 75 mL/hr at 01/28/22 1235    LOS: 0 days   Time spent: 87 mins  Dontrail Blackwell Wynetta Emery, MD How to contact the [facility] Attending or Consulting provider [place] or covering provider during after hours [place], for this patient?  Check the care team in [facility] and look for a) attending/consulting [facility] provider listed and b) the [facility] team listed Log into [email or web address] and use [place]'s universal password to access. If you do not have the password, please contact the hospital operator. Locate the [facility] provider you are looking for under [facility] and page to a number that you can be directly reached. If you  still have difficulty reaching the provider, please page the [facility] (Director on Call) for the Hospitalists listed on amion for assistance.  01/28/2022, 2:40 PM

## 2022-01-28 NOTE — Progress Notes (Signed)
Pharmacy Antibiotic Note  [name] is a 76 y.o. female admitted on 01/27/2022 with IAI.  Pharmacy has been consulted for Zosyn dosing.  Plan: Zosyn 3.375g IV q8h (4 hour infusion).  Height: 5\' 9"  (175.3 cm) Weight: 54.6 kg (120 lb 5.9 oz) IBW/kg (Calculated) : 66.2  Temp (24hrs), Avg:98.5 F (36.9 C), Min:97.8 F (36.6 C), Max:100.3 F (37.9 C)  Recent Labs  Lab 01/27/22 1511 01/28/22 0614  WBC 5.6 7.2  CREATININE 0.97 0.84    Estimated Creatinine Clearance: 50.6 mL/min (by C-G formula based on SCr of 0.84 mg/dL).    Allergies  Allergen Reactions   Ciprofloxacin Other (See Comments)    Caused pt to have delusional thoughts and hallucinations   Procardia [Nifedipine] Swelling    Edema     Norvasc [Amlodipine Besylate] Hives    Antimicrobials this admission: Zosyn 2/18 >>    Dose adjustments this admission:   Microbiology results: 2/17 BCx: pending  UCx:    Sputum:    MRSA PCR:   Thank you for allowing pharmacy to be a part of this patients care.  Hart Robinsons A 01/28/2022 10:59 AM

## 2022-01-28 NOTE — Assessment & Plan Note (Signed)
Renal function appears to be stable at this time.

## 2022-01-28 NOTE — Assessment & Plan Note (Addendum)
RUQ [place] neg for acute cholecystitis

## 2022-01-29 ENCOUNTER — Other Ambulatory Visit: Payer: Self-pay | Admitting: Family Medicine

## 2022-01-29 DIAGNOSIS — E871 Hypo-osmolality and hyponatremia: Secondary | ICD-10-CM

## 2022-01-29 DIAGNOSIS — R197 Diarrhea, unspecified: Secondary | ICD-10-CM

## 2022-01-29 DIAGNOSIS — R112 Nausea with vomiting, unspecified: Secondary | ICD-10-CM

## 2022-01-29 DIAGNOSIS — E162 Hypoglycemia, unspecified: Secondary | ICD-10-CM

## 2022-01-29 LAB — GASTROINTESTINAL PANEL BY PCR, STOOL (REPLACES STOOL CULTURE)

## 2022-01-29 LAB — CBC
HCT: 30.5 % — ABNORMAL LOW (ref 36.0–46.0)
Hemoglobin: 10.6 g/dL — ABNORMAL LOW (ref 12.0–15.0)
MCH: 34 pg (ref 26.0–34.0)
MCHC: 34.8 g/dL (ref 30.0–36.0)
MCV: 97.8 fL (ref 80.0–100.0)
Platelets: 185 10*3/uL (ref 150–400)
RBC: 3.12 MIL/uL — ABNORMAL LOW (ref 3.87–5.11)
RDW: 13 % (ref 11.5–15.5)
WBC: 5.8 10*3/uL (ref 4.0–10.5)
nRBC: 0 % (ref 0.0–0.2)

## 2022-01-29 LAB — BASIC METABOLIC PANEL
Anion gap: 6 (ref 5–15)
BUN: 7 mg/dL — ABNORMAL LOW (ref 8–23)
CO2: 22 mmol/L (ref 22–32)
Calcium: 7.5 mg/dL — ABNORMAL LOW (ref 8.9–10.3)
Chloride: 105 mmol/L (ref 98–111)
Creatinine, Ser: 0.96 mg/dL (ref 0.44–1.00)
GFR, Estimated: >60 mL/min (ref >60)
Glucose, Bld: 90 mg/dL (ref 70–99)
Potassium: 3.3 mmol/L — ABNORMAL LOW (ref 3.5–5.1)
Sodium: 133 mmol/L — ABNORMAL LOW (ref 135–145)

## 2022-01-29 LAB — GLUCOSE, CAPILLARY
Glucose-Capillary: 76 mg/dL (ref 70–99)
Glucose-Capillary: 74 mg/dL (ref 70–99)

## 2022-01-29 LAB — MAGNESIUM: Magnesium: 1.8 mg/dL (ref 1.7–2.4)

## 2022-01-29 MED ORDER — METOPROLOL SUCCINATE ER 50 MG PO TB24: 50.0000 mg | ORAL_TABLET | Freq: Every day | ORAL | 1 refills | Status: DC

## 2022-01-29 MED ORDER — VANCOMYCIN HCL 125 MG PO CAPS: 125.0000 mg | ORAL_CAPSULE | Freq: Four times a day (QID) | ORAL | 0 refills | Status: AC

## 2022-01-29 MED ORDER — ENSURE ENLIVE PO LIQD: 237.0000 mL | Freq: Two times a day (BID) | ORAL | 1 refills | Status: DC

## 2022-01-29 MED ORDER — SACCHAROMYCES BOULARDII 250 MG PO CAPS
250.0000 mg | ORAL_CAPSULE | Freq: Two times a day (BID) | ORAL | 0 refills | Status: DC
Start: 2022-01-29 — End: 2022-01-29

## 2022-01-29 MED ORDER — POTASSIUM CHLORIDE 20 MEQ PO PACK
40.0000 meq | PACK | Freq: Once | ORAL | Status: AC
Start: 2022-01-29 — End: 2022-01-29
  Administered 2022-01-29: 40 meq via ORAL
  Filled 2022-01-29: qty 2

## 2022-01-29 MED ORDER — VANCOMYCIN HCL 125 MG PO CAPS: 125.0000 mg | ORAL_CAPSULE | Freq: Four times a day (QID) | ORAL | 0 refills | Status: DC

## 2022-01-29 MED ORDER — SACCHAROMYCES BOULARDII 250 MG PO CAPS: 250.0000 mg | ORAL_CAPSULE | Freq: Two times a day (BID) | ORAL | 0 refills | Status: DC

## 2022-01-29 MED ORDER — ENSURE ENLIVE PO LIQD: 237.0000 mL | Freq: Two times a day (BID) | ORAL | 1 refills | Status: AC

## 2022-01-29 NOTE — Progress Notes (Signed)
01/29/2022  I spoke with PT at the bedside while they were working with patient and told me that patient could go home with [facility] with husband providing 24/7 supervision and patient will need a bedside commode.  Pt and husband said that they wanted to go home and would not agree to go to SNF.  Murvin Natal MD

## 2022-01-29 NOTE — Discharge Instructions (Addendum)
PLEASE TAKE VANCOMYCIN TABLETS UNTIL COMPLETE FULL COURSE OF TREATMENT    IMPORTANT INFORMATION: PAY CLOSE ATTENTION   PHYSICIAN DISCHARGE INSTRUCTIONS  Follow with Primary care provider  Claretta Fraise, MD  and other consultants as instructed by your Hospitalist Physician  [place] IF SYMPTOMS COME BACK, WORSEN OR NEW PROBLEM DEVELOPS   Please note: You were cared for by a hospitalist during your hospital stay. Every effort will be made to forward records to your primary care provider.  You can request that your primary care provider send for your hospital records if they have not received them.  Once you are discharged, your primary care physician will handle any further medical issues. Please note that NO REFILLS for any discharge medications will be authorized once you are discharged, as it is imperative that you return to your primary care physician (or establish a relationship with a primary care physician if you do not have one) for your post hospital discharge needs so that they can reassess your need for medications and monitor your lab values.  Please get a complete blood count and chemistry panel checked by your Primary MD at your next visit, and again as instructed by your Primary MD.  Get Medicines reviewed and adjusted: Please take all your medications with you for your next visit with your Primary MD  Laboratory/radiological data: Please request your Primary MD to go over all hospital tests and procedure/radiological results at the follow up, please ask your primary care provider to get all Hospital records sent to his/her office.  In some cases, they will be blood work, cultures and biopsy results pending at the time of your discharge. Please request that your primary care provider follow up on these results.  If you are diabetic, please bring your blood sugar readings with you to your follow up appointment with primary care.    Please  call and make your follow up appointments as soon as possible.    Also Note the following: If you experience worsening of your admission symptoms, develop shortness of breath, life threatening emergency, suicidal or homicidal thoughts you must seek medical attention immediately by calling 911 or calling your MD immediately  if symptoms less severe.  You must read complete instructions/literature along with all the possible adverse reactions/side effects for all the Medicines you take and that have been prescribed to you. Take any new Medicines after you have completely understood and accpet all the possible adverse reactions/side effects.   Do not drive when taking Pain medications or sleeping medications (Benzodiazepines)  Do not take more than prescribed Pain, Sleep and Anxiety Medications. It is not advisable to combine anxiety,sleep and pain medications without talking with your primary care practitioner  Special Instructions: If you have smoked or chewed Tobacco  in the last 2 yrs please stop smoking, stop any regular Alcohol  and or any Recreational drug use.  Wear Seat belts while driving.  Do not drive if taking any narcotic, mind altering or controlled substances or recreational drugs or alcohol.

## 2022-01-29 NOTE — Discharge Summary (Signed)
Physician Discharge Summary   Patient: [name] MRN: [id number] DOB: [birth year]  Admit date:     01/27/2022  Discharge date: 01/29/22  Discharge Physician: Irwin Brakeman   PCP: Claretta Fraise, MD   Recommendations at discharge:   Recommend outpatient palliative care consultation Ambulatory referral to dietitian  Home health services ordered PT, RN, SW Pt very near needing assisted living placement   Discharge Diagnoses: Principal Problem:   C. difficile colitis Active Problems:   Hypokalemia   Nausea vomiting and diarrhea   Acute metabolic encephalopathy   Hypoglycemia   Essential hypertension   Hyperlipidemia   Hypothyroidism   Chronic kidney disease (CKD), stage II (mild)   B12 deficiency   Carotid artery narrowing   Hyponatremia   Cholelithiases  Resolved Problems:   * No resolved hospital problems. *  Hospital Course: 76 y.o. female, with past medical history of hypertension, B12 deficiency, CKD stage II, hypothyroidism, osteoarthritis, patient approximately 2 months out of left total knee arthroplasty, patient was brought by her husband due to altered mental status, history mainly provided by husband, as she remains confused, for last 5 days patient has been having diarrhea, increasing weakness, poor oral intake, as well been having nausea, vomiting, last episode yesterday, diarrhea is copious, nonbloody, vomiting not associated with any blood, she denies any fever or chills, but she was febrile in ED 100.3, she does report some abdominal cramping, denies any previous episodes like that. -In ED patient was noted to be tachycardic at 106, febrile 100.3, work-up significant for severe hypokalemia at 2.5, bicarb low at 19, low at 132, patient started on IV fluids, and [facility] hospitalist consulted to admit.  CT scan suggesting colitis and cholelithiasis.    01/28/2022:  pt reports that loose stools have started to improve and abdominal pain improved. Tolerating full  liquid diet. Advance diet as tolerated orders placed.  Dietitian consulted.  RUQ abd [place] ordered.   Update:  C diff tested positive.  Starting oral vancomycin.    01/29/2022:  Pt eating and drinking.  Diarrhea has resolved.  She is tolerating oral vancomycin tablets.  PT evaluated and recommended home health and bedside commode.  Husband agreeable to take home and care for her 24/7.  Son Saralyn Pilar called and agreeable to check in on them frequently for next several days.   Assessment and Plan: * C. difficile colitis- (present on admission) Starting oral vancomycin x 10 days 01/28/22  Enteric precautions in place   DC home to complete oral vancomycin at home.   Hypoglycemia- (present on admission) From poor oral intake.  Monitored closely in hospital and resolved.  Ensure supplements given.   Acute metabolic encephalopathy- (present on admission) Resolving with supportive measures.   Nausea vomiting and diarrhea- (present on admission) RESOLVED with supportive measures.   Hypokalemia- (present on admission) Oral and IV replacement given and REPLETED    Chronic kidney disease (CKD), stage II (mild)- (present on admission) Renal function appears to be stable at this time.   Hypothyroidism- (present on admission) continue on home levothyroxine  Follow up with PCP  Hyperlipidemia- (present on admission) Not currently on meds.    Essential hypertension- (present on admission) Pt was restarted on metoprolol XL 50 mg (apparently had not been taking it at home) New Rx given.   B12 deficiency- (present on admission) Anemia panel pending.   Cholelithiases- (present on admission) RUQ [place] neg for acute cholecystitis   Hyponatremia- (present on admission) Suspect from dehydration Treated with IV fluid Na  up to 133         Consultants:  Procedures performed:   Disposition: Home health with husband providing 24/7 supervision, son Saralyn Pilar called and will also check frequently on  patient Diet recommendation:  Regular diet  DISCHARGE MEDICATION: Allergies as of 01/29/2022       Reactions   Ciprofloxacin Other (See Comments)   Caused pt to have delusional thoughts and hallucinations   Procardia [nifedipine] Swelling   Edema    Norvasc [amlodipine Besylate] Hives        Medication List     STOP taking these medications    folic acid 035 MCG tablet Commonly known as: FOLVITE       TAKE these medications    feeding supplement Liqd Take 237 mLs by mouth 2 (two) times daily between meals.   levothyroxine 100 MCG tablet Commonly known as: SYNTHROID Take 1 tablet (100 mcg total) by mouth daily.   metoprolol succinate 50 MG 24 hr tablet Commonly known as: TOPROL-XL Take 1 tablet (50 mg total) by mouth daily. Take with or immediately following a meal. Start taking on: January 30, 2022   saccharomyces boulardii 250 MG capsule Commonly known as: FLORASTOR Take 1 capsule (250 mg total) by mouth 2 (two) times daily.   vancomycin 125 MG capsule Commonly known as: VANCOCIN Take 1 capsule (125 mg total) by mouth 4 (four) times daily for 9 days.               Durable Medical Equipment  (From admission, onward)           Start     Ordered   01/29/22 1109  For home use only DME Bedside commode  Once       Question:  Patient needs a bedside commode to treat with the following condition  Answer:  Gait instability   01/29/22 1108            Follow-up Information     Stacks, Cletus Gash, MD. Schedule an appointment as soon as possible for a visit in 1 week(s).   Specialty: Family Medicine Why: Hospital Follow Up Contact information: [place] [id number] [phone]                 Discharge Exam: Danley Danker Weights   01/28/22 0800  Weight: 54.6 kg   General exam: thin emaciated appearing female, Appears calm and comfortable  Respiratory system: Clear to auscultation. Respiratory effort normal. Cardiovascular  system: normal S1 & S2 heard. No JVD, murmurs, rubs, gallops or clicks. No pedal edema. Gastrointestinal system: Abdomen is nondistended, soft and nontender. No organomegaly or masses felt. Normal bowel sounds heard. Central nervous system: Alert and oriented. No focal neurological deficits. Extremities: Symmetric 5 x 5 power. Skin: No rashes, lesions or ulcers. Psychiatry: Judgement and insight appear normal. Mood & affect appropriate.   Condition at discharge: stable  The results of significant diagnostics from this hospitalization (including imaging, microbiology, ancillary and laboratory) are listed below for reference.   Imaging Studies: CT Head Wo Contrast  Result Date: 01/27/2022 CLINICAL DATA:  Altered mental status. EXAM: CT HEAD WITHOUT CONTRAST TECHNIQUE: Contiguous axial images were obtained from the base of the skull through the vertex without intravenous contrast. RADIATION DOSE REDUCTION: This exam was performed according to the departmental dose-optimization program which includes automated exposure control, adjustment of the mA and/or kV according to patient size and/or use of iterative reconstruction technique. COMPARISON:  CT head dated June 30, 2020; MRI  examination dated May 10, 2021 FINDINGS: Brain: No evidence of acute infarction, hemorrhage, hydrocephalus, extra-axial collection or mass lesion/mass effect. Cerebral atrophy and chronic advanced microvascular ischemic changes of the periventricular white matter. Vascular: No hyperdense vessel or unexpected calcification. Skull: Normal. Negative for fracture or focal lesion. Sinuses/Orbits: Bilateral cataract surgery. Paranasal sinuses are clear. Other: None. IMPRESSION: 1.  No acute intracranial abnormality. 2. Cerebral atrophy and advanced chronic microvascular ischemic changes of the white matter. Electronically Signed   By: Keane Police D.O.   On: 01/27/2022 16:14   CT ABDOMEN PELVIS W CONTRAST  Result Date:  01/27/2022 CLINICAL DATA:  Diarrhea, nausea and vomiting. Fever. Total knee arthroplasty 2 months ago. EXAM: CT ABDOMEN AND PELVIS WITH CONTRAST TECHNIQUE: Multidetector CT imaging of the abdomen and pelvis was performed using the standard protocol following bolus administration of intravenous contrast. RADIATION DOSE REDUCTION: This exam was performed according to the departmental dose-optimization program which includes automated exposure control, adjustment of the mA and/or kV according to patient size and/or use of iterative reconstruction technique. CONTRAST:  152mL OMNIPAQUE IOHEXOL 300 MG/ML  SOLN COMPARISON:  10/12/2019 FINDINGS: Lower chest: Small effusions layering dependently with minimal dependent atelectasis. Hepatobiliary: Benign hemangioma within the right lobe of the liver as seen previously, 1.7 cm in diameter. There are small gallstones in the gallbladder. No definite cholecystitis. No visible choledocholithiasis. Pancreas: Normal.  Duodenal diverticulum incidentally noted. Spleen: Normal Adrenals/Urinary Tract: Adrenal glands are normal. Kidneys are normal. No cyst, mass, stone or hydronephrosis. No evidence of pyelonephritis as was seen in 2020. Bladder is normal. Stomach/Bowel: Stomach is normal. No small bowel pathology is seen. There appears to be wall thickening of the colon, particularly of the right colon, which could go along with colitis. No evidence of obstruction. No evidence of diverticulitis. Vascular/Lymphatic: Aortic atherosclerosis. No aneurysm. IVC is normal. No adenopathy. Reproductive: No pelvic mass. Other: Small amount of ascites. Musculoskeletal: Previously augmented compression fracture at L3. Old compression fractures at T12, L1 and L2. No evidence of acute fracture. IMPRESSION: Small effusions layering dependently with minimal dependent atelectasis at the lung bases. Cholelithiasis. Gallbladder is full but there is no definite CT evidence of cholecystitis or biliary  obstruction. If there is clinical concern regarding possible cholecystitis or choledocholithiasis, consider ultrasound and/or nuclear medicine hepatobiliary scan. Mild wall thickening of the colon, particularly the right colon, suggesting possible colitis. No evidence of diverticulitis. No evidence of bowel obstruction. Small amount of ascites, freely distributed. Aortic Atherosclerosis (ICD10-I70.0). Electronically Signed   By: Nelson Chimes M.D.   On: 01/27/2022 21:24   DG Chest Port 1 View  Result Date: 01/27/2022 CLINICAL DATA:  Altered level of consciousness, fell yesterday EXAM: PORTABLE CHEST 1 VIEW COMPARISON:  10/12/2019 FINDINGS: 2 frontal views of the chest demonstrate an unremarkable cardiac silhouette. Stable ectasia of the thoracic aorta. No acute airspace disease, effusion, or pneumothorax. Chronic bilateral pleural and parenchymal scarring, greatest at the apices. No acute bony abnormalities. IMPRESSION: 1. Stable chest, no acute process. Electronically Signed   By: Randa Ngo M.D.   On: 01/27/2022 16:58   US Abdomen Limited RUQ (LIVER/GB)  Result Date: 01/28/2022 CLINICAL DATA:  Cholelithiasis EXAM: ULTRASOUND ABDOMEN LIMITED RIGHT UPPER QUADRANT COMPARISON:  Abdominal ultrasound 10/18/2012 FINDINGS: Gallbladder: Contains moderate amount of sludge and echogenic small stones. No significant wall thickening or pericholecystic fluid. No sonographic Murphy sign. Common bile duct: Diameter: 4 mm Liver: Coarse, increased echogenicity of the parenchyma with no suspicious mass identified. Portal vein is patent on color Doppler  imaging with normal direction of blood flow towards the liver. Other: None. IMPRESSION: 1. Cholelithiasis and sludge. 2. Increased echogenicity of the liver parenchyma which may represent hepatic steatosis and/or other hepatocellular disease. Electronically Signed   By: Ofilia Neas M.D.   On: 01/28/2022 11:23    Microbiology: Results for orders placed or performed  during the hospital encounter of 01/27/22  Resp Panel by RT-PCR (Flu A&B, Covid) Nasopharyngeal Swab     Status: None   Collection Time: 01/27/22  3:32 PM   Specimen: Nasopharyngeal Swab; Nasopharyngeal(NP) swabs in vial transport medium  Result Value Ref Range Status   SARS Coronavirus 2 by RT PCR NEGATIVE NEGATIVE Final    Comment: (NOTE) SARS-CoV-2 target nucleic acids are NOT DETECTED.  The SARS-CoV-2 RNA is generally detectable in upper respiratory specimens during the acute phase of infection. The lowest concentration of SARS-CoV-2 viral copies this assay can detect is 138 copies/mL. A negative result does not preclude SARS-Cov-2 infection and should not be used as the sole basis for treatment or other patient management decisions. A negative result may occur with  improper specimen collection/handling, submission of specimen other than nasopharyngeal swab, presence of viral mutation(s) within the areas targeted by this assay, and inadequate number of viral copies(<138 copies/mL). A negative result must be combined with clinical observations, patient history, and epidemiological information. The expected result is Negative.  Fact Sheet for Patients:  EntrepreneurPulse.com.au  Fact Sheet for Healthcare Providers:  IncredibleEmployment.be  This test is no t yet approved or cleared by the [place] FDA and  has been authorized for detection and/or diagnosis of SARS-CoV-2 by FDA under an Emergency Use Authorization (EUA). This EUA will remain  in effect (meaning this test can be used) for the duration of the COVID-19 declaration under Section 564(b)(1) of the Act, 21 U.S.C.section 360bbb-3(b)(1), unless the authorization is terminated  or revoked sooner.       Influenza A by PCR NEGATIVE NEGATIVE Final   Influenza B by PCR NEGATIVE NEGATIVE Final    Comment: (NOTE) The Xpert Xpress SARS-CoV-2/FLU/RSV plus assay is intended as an  aid in the diagnosis of influenza from Nasopharyngeal swab specimens and should not be used as a sole basis for treatment. Nasal washings and aspirates are unacceptable for Xpert Xpress SARS-CoV-2/FLU/RSV testing.  Fact Sheet for Patients: EntrepreneurPulse.com.au  Fact Sheet for Healthcare Providers: IncredibleEmployment.be  This test is not yet approved or cleared by the [place] FDA and has been authorized for detection and/or diagnosis of SARS-CoV-2 by FDA under an Emergency Use Authorization (EUA). This EUA will remain in effect (meaning this test can be used) for the duration of the COVID-19 declaration under Section 564(b)(1) of the Act, 21 U.S.C. section 360bbb-3(b)(1), unless the authorization is terminated or revoked.  Performed at [facility], [address] [place], [place] [address]   Culture, blood (routine x 2)     Status: None (Preliminary result)   Collection Time: 01/27/22  7:16 PM   Specimen: Right Antecubital; Blood  Result Value Ref Range Status   Specimen Description   Final    RIGHT ANTECUBITAL BOTTLES DRAWN AEROBIC AND ANAEROBIC   Special Requests Blood Culture adequate volume  Final   Culture   Final    NO GROWTH 2 DAYS Performed at [facility], [address], [place], [place] [address]    Report Status PENDING  Incomplete  Culture, blood (routine x 2)     Status: None (Preliminary result)   Collection Time: 01/27/22  7:22 PM   Specimen: BLOOD RIGHT HAND  Result Value Ref Range Status   Specimen Description   Final    BLOOD RIGHT HAND BOTTLES DRAWN AEROBIC AND ANAEROBIC   Special Requests Blood Culture adequate volume  Final   Culture   Final    NO GROWTH 2 DAYS Performed at [facility], [address] [place], [place] [address]    Report Status PENDING  Incomplete  C Difficile Quick Screen w PCR reflex     Status: Abnormal   Collection Time: 01/28/22 12:23 AM   Specimen: Stool  Result Value Ref Range  Status   C Diff antigen POSITIVE (A) NEGATIVE Final   C Diff toxin NEGATIVE NEGATIVE Final   C Diff interpretation Results are indeterminate. See PCR results.  Final    Comment: Performed at [facility], [address], [place], [place] [address]  C. Diff by PCR, Reflexed     Status: Abnormal   Collection Time: 01/28/22 12:23 AM  Result Value Ref Range Status   Toxigenic C. Difficile by PCR POSITIVE (A) NEGATIVE Final    Comment: Positive for toxigenic C. difficile with little to no toxin production. Only treat if clinical presentation suggests symptomatic illness. Performed at [facility], [place] [address], [place], [place] [address]     Labs: CBC: Recent Labs  Lab 01/27/22 1511 01/28/22 0614 01/29/22 0516  WBC 5.6 7.2 5.8  NEUTROABS 4.5  --   --   HGB 10.8* 11.0* 10.6*  HCT 29.7* 33.2* 30.5*  MCV 95.8 96.8 97.8  PLT 111* 137* 248   Basic Metabolic Panel: Recent Labs  Lab 01/27/22 1511 01/27/22 1657 01/28/22 0614 01/29/22 0516  NA 132*  --  130* 133*  K 2.5*  --  3.3* 3.3*  CL 102  --  101 105  CO2 19*  --  17* 22  GLUCOSE 86  --  59* 90  BUN 10  --  8 7*  CREATININE 0.97  --  0.84 0.96  CALCIUM 7.2*  --  7.1* 7.5*  MG  --  1.8 2.0 1.8   Liver Function Tests: Recent Labs  Lab 01/27/22 1511  AST 36  ALT 20  ALKPHOS 102  BILITOT 1.8*  PROT 5.5*  ALBUMIN 2.8*   CBG: Recent Labs  Lab 01/27/22 1532 01/28/22 1216 01/28/22 1655 01/28/22 2218 01/29/22 0747  GLUCAP 92 60* 98 89 76    Discharge time spent: greater than 30 minutes.  Signed: Irwin Brakeman, MD [facility] 01/29/2022

## 2022-01-29 NOTE — Plan of Care (Signed)
°  Problem: Acute Rehab PT Goals(only PT should resolve) Goal: Pt Will Go Supine/Side To Sit Outcome: Progressing Flowsheets (Taken 01/29/2022 1129) Pt will go Supine/Side to Sit:  with supervision  with modified independence Goal: Pt Will Go Sit To Supine/Side Outcome: Progressing Flowsheets (Taken 01/29/2022 1129) Pt will go Sit to Supine/Side:  with supervision  with modified independence Goal: Patient Will Transfer Sit To/From Stand Outcome: Progressing Flowsheets (Taken 01/29/2022 1129) Patient will transfer sit to/from stand: with min guard assist Goal: Pt Will Transfer Bed To Chair/Chair To Bed Outcome: Progressing Flowsheets (Taken 01/29/2022 1129) Pt will Transfer Bed to Chair/Chair to Bed: min guard assist Goal: Pt Will Ambulate Outcome: Progressing Flowsheets (Taken 01/29/2022 1129) Pt will Ambulate:  10 feet  with min guard assist  with least restrictive assistive device Goal: Pt Will Go Up/Down Stairs Outcome: Progressing Flowsheets (Taken 01/29/2022 1129) Pt will Go Up / Down Stairs:  3-5 stairs  with minimal assist  with least restrictive assistive device   Pamala Hurry D. Hartnett-Rands, MS, PT [facility] [place] [phone] 01/29/2022

## 2022-01-29 NOTE — Evaluation (Signed)
Physical Therapy Evaluation Patient Details Name: [name] MRN: [id number] DOB: 1946-11-13 Today's Date: 01/29/2022  History of Present Illness  76 y.o. female, with past medical history of hypertension, B12 deficiency, CKD stage II, hypothyroidism, osteoarthritis, patient approximately 2 months out of left total knee arthroplasty, patient was brought by her husband due to altered mental status, history mainly provided by husband, as she remains confused, for last 5 days patient has been having diarrhea, increasing weakness, poor oral intake, as well been having nausea, vomiting, last episode yesterday, diarrhea is copious, nonbloody, vomiting not associated with any blood, she denies any fever or chills, but she was febrile in ED 100.3, she does report some abdominal cramping, denies any previous episodes like that.  -In ED patient was noted to be tachycardic at 106, febrile 100.3, work-up significant for severe hypokalemia at 2.5, bicarb low at 19, low at 132, patient started on IV fluids, and [facility] hospitalist consulted to admit.  CT scan suggesting colitis and cholelithiasis.  Clinical Impression  Patient agreeable to participating in PT evaluation today. Husband present through most of session. Patient very deconditioned and weak requiring extra time, encouragement, multimodal cuing and assistance for all mobility. Patient required assistance to power up with sit to stand. Patient required multimodal cuing for sequencing of steps and placement of hands for use of RW during transfers and gait. Husband reports 2-3 falls in the last six months. Patient and husband report their home bathroom toilet is 5 feet from their bed. Patient would continue to benefit from skilled physical therapy in current environment and next venue to continue return to prior function and increase strength, endurance, balance, coordination, and functional mobility and gait skills.         Recommendations for follow up  therapy are one component of a multi-disciplinary discharge planning process, led by the attending physician.  Recommendations may be updated based on patient status, additional functional criteria and insurance authorization.  Follow Up Recommendations Skilled nursing-short term rehab (<3 hours/day)    Assistance Recommended at Discharge Frequent or constant Supervision/Assistance  Patient can return home with the following  A lot of help with walking and/or transfers;A lot of help with bathing/dressing/bathroom;Assistance with feeding;Assist for transportation;Direct supervision/assist for financial management;Direct supervision/assist for medications management;Help with stairs or ramp for entrance    Equipment Recommendations Rolling walker (2 wheels)  Recommendations for Other Services       Functional Status Assessment Patient has had a recent decline in their functional status and demonstrates the ability to make significant improvements in function in a reasonable and predictable amount of time.     Precautions / Restrictions Precautions Precautions: Fall Precaution Comments: patient reports2-3 falls in the last six months Restrictions Weight Bearing Restrictions: No      Mobility  Bed Mobility Overal bed mobility: Modified Independent     General bed mobility comments: very slow and labored    Transfers Overall transfer level: Needs assistance Equipment used: Rolling walker (2 wheels) Transfers: Sit to/from Stand, Bed to chair/wheelchair/BSC Sit to Stand: Min assist   Step pivot transfers: Min assist       General transfer comment: unable to independently complete power up for sit to stand; verbal cues and min assist for sequencing of steps and placement of hands for transfers and use of RW    Ambulation/Gait Ambulation/Gait assistance: Min guard, Min assist Gait Distance (Feet): 4 Feet Assistive device: Rolling walker (2 wheels) Gait Pattern/deviations:  Step-to pattern, Decreased step length - right, Decreased  step length - left, Decreased stride length, Trunk flexed, Narrow base of support Gait velocity: decreased     General Gait Details: slow, labored cadence with RW; verbal cues and min assist for sequencing of steps and placement of hands for use of RW with gait; on room air; limited by fatigue  Stairs      Wheelchair Mobility    Modified Rankin (Stroke Patients Only)       Balance Overall balance assessment: Needs assistance Sitting-balance support: Bilateral upper extremity supported, Feet supported Sitting balance-Leahy Scale: Fair     Standing balance support: Bilateral upper extremity supported, During functional activity, Reliant on assistive device for balance Standing balance-Leahy Scale: Fair Standing balance comment: with RW         Pertinent Vitals/Pain Pain Assessment Pain Assessment: No/denies pain    Home Living Family/patient expects to be discharged to:: Private residence Living Arrangements: Spouse/significant other Available Help at Discharge: Family;Available 24 hours/day Type of Home: House Home Access: Stairs to enter Entrance Stairs-Rails: None Entrance Stairs-Number of Steps: 3   Home Layout: One level Home Equipment: Conservation officer, nature (2 wheels);Cane - single point;Cane - quad;Shower seat;Hand held shower head;Grab bars - tub/shower;Grab bars - toilet      Prior Function Prior Level of Function : Driving;History of Falls (last six months);Independent/Modified Independent             Mobility Comments: uses cane, or RW depending on where she is; uses grocery cart for balance at store ADLs Comments: Independent prior to admission     Hand Dominance   Dominant Hand: Left    Extremity/Trunk Assessment   Upper Extremity Assessment Upper Extremity Assessment: Generalized weakness    Lower Extremity Assessment Lower Extremity Assessment: Generalized weakness    Cervical /  Trunk Assessment Cervical / Trunk Assessment: Kyphotic  Communication   Communication: HOH  Cognition Arousal/Alertness: Awake/alert Behavior During Therapy: WFL for tasks assessed/performed Overall Cognitive Status: Within Functional Limits for tasks assessed        General Comments      Exercises     Assessment/Plan    PT Assessment Patient needs continued PT services  PT Problem List Decreased strength;Decreased mobility;Decreased safety awareness;Decreased knowledge of precautions;Decreased activity tolerance;Decreased balance;Decreased knowledge of use of DME       PT Treatment Interventions DME instruction;Therapeutic exercise;Gait training;Balance training;Neuromuscular re-education;Functional mobility training;Therapeutic activities;Patient/family education;Stair training    PT Goals (Current goals can be found in the Care Plan section)  Acute Rehab PT Goals Patient Stated Goal: Go home. PT Goal Formulation: With patient/family Time For Goal Achievement: 02/12/22 Potential to Achieve Goals: Fair    Frequency Min 3X/week        AM-PAC PT "6 Clicks" Mobility  Outcome Measure Help needed turning from your back to your side while in a flat bed without using bedrails?: A Little Help needed moving from lying on your back to sitting on the side of a flat bed without using bedrails?: A Little Help needed moving to and from a bed to a chair (including a wheelchair)?: A Little Help needed standing up from a chair using your arms (e.g., wheelchair or bedside chair)?: A Little Help needed to walk in hospital room?: A Lot Help needed climbing 3-5 steps with a railing? : A Lot 6 Click Score: 16    End of Session Equipment Utilized During Treatment: Gait belt Activity Tolerance: Patient limited by fatigue Patient left: in chair;with nursing/sitter in room;with family/visitor present Nurse Communication: Mobility status PT Visit  Diagnosis: Unsteadiness on feet  (R26.81);Other abnormalities of gait and mobility (R26.89);Muscle weakness (generalized) (M62.81);History of falling (Z91.81);Difficulty in walking, not elsewhere classified (R26.2)    Time: 2297-9892 PT Time Calculation (min) (ACUTE ONLY): 45 min   Charges:   PT Evaluation $PT Eval Low Complexity: 1 Low PT Treatments $Therapeutic Activity: 8-22 mins        Floria Raveling. Hartnett-Rands, MS, PT Per [place] [phone]  Pamala Hurry  Hartnett-Rands 01/29/2022, 11:25 AM

## 2022-01-29 NOTE — Progress Notes (Signed)
Nsg Discharge Note  Admit Date:  01/27/2022 Discharge date: 01/29/2022   [name] to be D/C'd Home per MD order.  AVS completed.   Patient/caregiver able to verbalize understanding.  Discharge Medication: Allergies as of 01/29/2022       Reactions   Ciprofloxacin Other (See Comments)   Caused pt to have delusional thoughts and hallucinations   Procardia [nifedipine] Swelling   Edema    Norvasc [amlodipine Besylate] Hives        Medication List     STOP taking these medications    folic acid 503 MCG tablet Commonly known as: FOLVITE       TAKE these medications    feeding supplement Liqd Take 237 mLs by mouth 2 (two) times daily between meals.   levothyroxine 100 MCG tablet Commonly known as: SYNTHROID Take 1 tablet (100 mcg total) by mouth daily.   metoprolol succinate 50 MG 24 hr tablet Commonly known as: TOPROL-XL Take 1 tablet (50 mg total) by mouth daily. Take with or immediately following a meal. Start taking on: January 30, 2022   saccharomyces boulardii 250 MG capsule Commonly known as: FLORASTOR Take 1 capsule (250 mg total) by mouth 2 (two) times daily.   vancomycin 125 MG capsule Commonly known as: VANCOCIN Take 1 capsule (125 mg total) by mouth 4 (four) times daily for 9 days.               Durable Medical Equipment  (From admission, onward)           Start     Ordered   01/29/22 1109  For home use only DME Bedside commode  Once       Question:  Patient needs a bedside commode to treat with the following condition  Answer:  Gait instability   01/29/22 1108            Discharge Assessment: Vitals:   01/28/22 2111 01/29/22 0355  BP: 140/84 (!) 150/84  Pulse: 69 68  Resp: 18 19  Temp: 98.6 F (37 C) 97.8 F (36.6 C)  SpO2: 100% 100%   Skin clean, dry and intact without evidence of skin break down, no evidence of skin tears noted. IV catheter discontinued intact. Site without signs and symptoms of complications - no  redness or edema noted at insertion site, patient denies c/o pain - only slight tenderness at site.  Dressing with slight pressure applied.  D/c Instructions-Education: Discharge instructions given to patient/family with verbalized understanding. D/c education completed with patient/family including follow up instructions, medication list, d/c activities limitations if indicated, with other d/c instructions as indicated by MD - patient able to verbalize understanding, all questions fully answered. Patient instructed to return to ED, call 911, or call MD for any changes in condition.  Patient escorted via [place], and D/C home via private auto.  Kathie Rhodes, RN 01/29/2022 11:36 AM

## 2022-01-29 NOTE — Progress Notes (Signed)
Husband called, he is at [facility]. Rxs were sent to [facility].  He requested Rxs be sent to [place]. I sent all Rxs there today.  2:38 PM 01/29/2022  C. Wynetta Emery, MD

## 2022-01-29 NOTE — Progress Notes (Addendum)
CSW spoke with patient's son Saralyn Pilar who states he is agreeable to check on the patient and her husband today after discharge. Saralyn Pilar states he only lives 3 streets away from the couple. CSW answered all questions.  Madilyn Fireman, MSW, LCSW Transitions of Care   Clinical Social Worker II [phone]

## 2022-01-30 ENCOUNTER — Telehealth: Payer: Self-pay

## 2022-01-30 DIAGNOSIS — Z748 Other problems related to care provider dependency: Secondary | ICD-10-CM

## 2022-01-30 NOTE — Telephone Encounter (Signed)
Transition Care Management Follow-up Telephone Call Date of discharge and from where: 01/29/22 - [place].Diff colitis How have you been since you were released from the hospital? Doing okay, stable Any questions or concerns? No  Items Reviewed: Did the pt receive and understand the discharge instructions provided? Yes  Medications obtained and verified? Yes  Other? No  Any new allergies since your discharge? No  Dietary orders reviewed? Yes Do you have support at home? No   Home Care and Equipment/Supplies: Were home health services ordered? yes If so, what is the name of the agency? unknown  Has the agency set up a time to come to the patient's home? no Were any new equipment or medical supplies ordered?  Yes: bedside commode What is the name of the medical supply agency? [email or web address] Were you able to get the supplies/equipment? no Do you have any questions related to the use of the equipment or supplies? Yes: hasn't heard from anyone regarding this and really needs it  Functional Questionnaire: (I = Independent and D = Dependent) ADLs: I  Bathing/Dressing- I  Meal Prep- D  Eating- I  Maintaining continence- I  Transferring/Ambulation- I  Managing Meds- I  Follow up appointments reviewed:  PCP Hospital f/u appt confirmed? Yes  Scheduled to see [facility] on 02/02/22 @ 2:25. [facility] f/u appt confirmed? No   Are transportation arrangements needed? Yes  - CRR sent to assist If their condition worsens, is the pt aware to call PCP or go to the Emergency Dept.? Yes Was the patient provided with contact information for the PCP's office or ED? Yes Was to pt encouraged to call back with questions or concerns? Yes

## 2022-01-30 NOTE — Telephone Encounter (Signed)
° °  Telephone encounter was:  Unsuccessful.  01/30/2022 Name: [name] MRN: [id number] DOB: 27-Jun-1946  Unsuccessful outbound call made today to assist with:  Transportation Needs   Outreach Attempt:  1st Attempt voice mailbox is full and cant receive messages at this time   [place], Care Management  [phone] [address], [place], [place] [address] Phone: [phone] Email: [email or web address]

## 2022-01-31 ENCOUNTER — Telehealth: Payer: Self-pay

## 2022-01-31 NOTE — Telephone Encounter (Signed)
° °  Telephone encounter was:  Unsuccessful.  01/31/2022 Name: [name] MRN: [id number] DOB: [birth year]  Unsuccessful outbound call made today to assist with:  Transportation Needs   Outreach Attempt:  2nd Attempt could not leave a message [place], Care Management  [phone] [address], [place], [place] [address] Phone: [phone] Email: [email or web address]

## 2022-02-01 ENCOUNTER — Telehealth: Payer: Self-pay

## 2022-02-01 LAB — CULTURE, BLOOD (ROUTINE X 2)
Culture: NO GROWTH
Culture: NO GROWTH
Special Requests: ADEQUATE
Special Requests: ADEQUATE

## 2022-02-01 NOTE — Telephone Encounter (Signed)
° °  Telephone encounter was:  Unsuccessful.  02/01/2022 Name: [name] MRN: [id number] DOB: [birth year]  Unsuccessful outbound call made today to assist with:  Transportation Needs   Outreach Attempt:  3rd Attempt.  Referral closed unable to contact patient.  A HIPAA compliant voice message was left requesting a return call.  Instructed patient to call back at earliest convenience. [place], Care Management  [phone] [address], [place], [place] [address] Phone: [phone] Email: [email or web address]

## 2022-02-02 ENCOUNTER — Ambulatory Visit: Payer: Medicare HMO | Admitting: Family Medicine

## 2022-02-03 ENCOUNTER — Encounter: Payer: Self-pay | Admitting: Family Medicine

## 2022-03-03 ENCOUNTER — Encounter (HOSPITAL_COMMUNITY): Payer: Self-pay

## 2022-03-03 ENCOUNTER — Other Ambulatory Visit: Payer: Self-pay

## 2022-03-03 ENCOUNTER — Emergency Department (HOSPITAL_COMMUNITY): Payer: Medicare HMO

## 2022-03-03 ENCOUNTER — Emergency Department (HOSPITAL_COMMUNITY)
Admission: EM | Admit: 2022-03-03 | Discharge: 2022-03-04 | Disposition: A | Discharge status: Home / Self Care | Payer: Medicare HMO | Attending: Emergency Medicine | Admitting: Emergency Medicine

## 2022-03-03 DIAGNOSIS — Z79899 Other long term (current) drug therapy: Secondary | ICD-10-CM | POA: Insufficient documentation

## 2022-03-03 DIAGNOSIS — E860 Dehydration: Secondary | ICD-10-CM

## 2022-03-03 DIAGNOSIS — N390 Urinary tract infection, site not specified: Secondary | ICD-10-CM | POA: Diagnosis not present

## 2022-03-03 DIAGNOSIS — Z743 Need for continuous supervision: Secondary | ICD-10-CM | POA: Diagnosis not present

## 2022-03-03 DIAGNOSIS — M549 Dorsalgia, unspecified: Secondary | ICD-10-CM | POA: Diagnosis not present

## 2022-03-03 DIAGNOSIS — R4182 Altered mental status, unspecified: Secondary | ICD-10-CM | POA: Insufficient documentation

## 2022-03-03 DIAGNOSIS — M545 Low back pain, unspecified: Secondary | ICD-10-CM | POA: Diagnosis not present

## 2022-03-03 DIAGNOSIS — R519 Headache, unspecified: Secondary | ICD-10-CM | POA: Diagnosis not present

## 2022-03-03 DIAGNOSIS — E876 Hypokalemia: Secondary | ICD-10-CM

## 2022-03-03 DIAGNOSIS — I10 Essential (primary) hypertension: Secondary | ICD-10-CM | POA: Diagnosis not present

## 2022-03-03 HISTORY — DX: Unspecified urinary incontinence: R32

## 2022-03-03 LAB — URINALYSIS, ROUTINE W REFLEX MICROSCOPIC
Glucose, UA: NEGATIVE mg/dL
Hgb urine dipstick: NEGATIVE
Ketones, ur: 5 mg/dL — AB
Leukocytes,Ua: NEGATIVE
Nitrite: NEGATIVE
Protein, ur: 100 mg/dL — AB
Specific Gravity, Urine: 1.018 (ref 1.005–1.030)
WBC, UA: >50 WBC/hpf — ABNORMAL HIGH (ref 0–5)
pH: 7 (ref 5.0–8.0)

## 2022-03-03 NOTE — ED Provider Notes (Signed)
?[facility] ?Provider Note ? ? ?CSN: [id number] ?Arrival date & time: 03/03/22  2121 ? ?  ? ?History ? ?Chief Complaint  ?Patient presents with  ? Back Pain  ? ? ?[name] is a 76 y.o. female. ? ?HPI ?Patient was at home today when she noticed back pain that was severe.  Also at the same time she felt like she was confused and could not remember the day, month or year.  Back pain has since improved.  She came here by EMS for evaluation.  She states that she married her husband, 3 months ago.  She denies recent fever, chills, cough, shortness of breath or vomiting.  She has noticed that she has been urinating frequently recently ?  ? ?Home Medications ?Prior to Admission medications   ?Medication Sig Start Date End Date Taking? Authorizing Provider  ?amoxicillin-clavulanate (AUGMENTIN) 875-125 MG tablet Take 1 tablet by mouth 2 (two) times daily. One po bid x 7 days 03/04/22  Yes Daleen Bo, MD  ?levothyroxine (SYNTHROID) 100 MCG tablet Take 1 tablet (100 mcg total) by mouth daily. 09/21/21   Claretta Fraise, MD  ?metoprolol succinate (TOPROL-XL) 50 MG 24 hr tablet Take 1 tablet (50 mg total) by mouth daily. Take with or immediately following a meal. 01/30/22   Johnson, Clanford L, MD  ?saccharomyces boulardii (FLORASTOR) 250 MG capsule Take 1 capsule (250 mg total) by mouth 2 (two) times daily. 01/29/22   Murlean Iba, MD  ?   ? ?Allergies    ?Ciprofloxacin, Procardia [nifedipine], and Norvasc [amlodipine besylate]   ? ?Review of Systems   ?Review of Systems ? ?Physical Exam ?Updated Vital Signs ?BP (!) 131/93 (BP Location: Right Arm)   Pulse 86   Temp 97.8 ?F (36.6 ?C) (Oral)   Resp 17   Ht '5\' 9"'$  (1.753 m)   Wt 59 kg   LMP 08/21/2013   SpO2 100%   BMI 19.20 kg/m?  ?Physical Exam ?Vitals and nursing note reviewed.  ?Constitutional:   ?   General: She is not in acute distress. ?   Appearance: She is well-developed. She is not ill-appearing, toxic-appearing or diaphoretic.   ?HENT:  ?   Head: Normocephalic and atraumatic.  ?   Right Ear: External ear normal.  ?   Left Ear: External ear normal.  ?   Mouth/Throat:  ?   Mouth: Mucous membranes are moist.  ?   Pharynx: Oropharynx is clear. No oropharyngeal exudate or posterior oropharyngeal erythema.  ?Eyes:  ?   Conjunctiva/sclera: Conjunctivae normal.  ?   Pupils: Pupils are equal, round, and reactive to light.  ?Neck:  ?   Trachea: Phonation normal.  ?Cardiovascular:  ?   Rate and Rhythm: Normal rate and regular rhythm.  ?   Heart sounds: Normal heart sounds.  ?Pulmonary:  ?   Effort: Pulmonary effort is normal. No respiratory distress.  ?   Breath sounds: Normal breath sounds. No stridor.  ?Abdominal:  ?   General: There is no distension.  ?   Palpations: Abdomen is soft.  ?   Tenderness: There is no abdominal tenderness.  ?Musculoskeletal:     ?   General: Normal range of motion.  ?   Cervical back: Normal range of motion and neck supple.  ?Skin: ?   General: Skin is warm and dry.  ?Neurological:  ?   Mental Status: She is alert.  ?   Cranial Nerves: No cranial nerve deficit.  ?   Sensory:  No sensory deficit.  ?   Motor: No abnormal muscle tone.  ?   Coordination: Coordination normal.  ?   Comments: No dysarthria, aphasia or nystagmus.  Normal strength arms and legs bilaterally.  ?Psychiatric:     ?   Mood and Affect: Mood normal.     ?   Behavior: Behavior normal.  ? ? ?ED Results / Procedures / Treatments   ?Labs ?(all labs ordered are listed, but only abnormal results are displayed) ?Labs Reviewed  ?CBC WITH DIFFERENTIAL/PLATELET - Abnormal; Notable for the following components:  ?    Result Value  ? RBC 3.22 (*)   ? Hemoglobin 10.4 (*)   ? HCT 29.7 (*)   ? All other components within normal limits  ?URINALYSIS, ROUTINE W REFLEX MICROSCOPIC - Abnormal; Notable for the following components:  ? APPearance TURBID (*)   ? Bilirubin Urine SMALL (*)   ? Ketones, ur 5 (*)   ? Protein, ur 100 (*)   ? WBC, UA >50 (*)   ? Bacteria, UA MANY  (*)   ? Non Squamous Epithelial 0-5 (*)   ? All other components within normal limits  ?URINE CULTURE  ?COMPREHENSIVE METABOLIC PANEL  ? ? ?EKG ?None ? ?Radiology ?CT Head Wo Contrast ? ?Result Date: 03/03/2022 ?CLINICAL DATA:  Intermittent back pain. EXAM: CT HEAD WITHOUT CONTRAST TECHNIQUE: Contiguous axial images were obtained from the base of the skull through the vertex without intravenous contrast. RADIATION DOSE REDUCTION: This exam was performed according to the departmental dose-optimization program which includes automated exposure control, adjustment of the mA and/or kV according to patient size and/or use of iterative reconstruction technique. COMPARISON:  January 27, 2022 FINDINGS: Brain: There is moderate severity cerebral atrophy with widening of the extra-axial spaces and ventricular dilatation. There are areas of decreased attenuation within the white matter tracts of the supratentorial brain, consistent with microvascular disease changes. Vascular: No hyperdense vessel or unexpected calcification. Skull: Normal. Negative for fracture or focal lesion. Sinuses/Orbits: No acute finding. Other: None. IMPRESSION: 1. No acute intracranial abnormality. 2. 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 Electronically Signed   By: Virgina Norfolk M.D.   On: 03/03/2022 23:44   ? ?Procedures ?Procedures  ? ? ?Medications Ordered in ED ?Medications  ?amoxicillin-clavulanate (AUGMENTIN) 875-125 MG per tablet 1 tablet (has no administration in time range)  ? ? ?ED Course/ Medical Decision Making/ A&P ?Clinical Course as of 03/04/22 0047  ?Fri Mar 03, 2022  ?2247 She c/o  confusion and back pain tonight. She could not remember the year or date.  [EW]  ?Sat Mar 04, 2022  ?0032 Blood work ordered earlier is just now being drawn.  She has a history of hypokalemia previously.  She was hospitalized for metabolic encephalopathy, about 6 weeks ago. [EW]  ?  ?Clinical  Course User Index ?[EW] Daleen Bo, MD  ? ?                        ?Medical Decision Making ?Patient presenting with transient back pain, which started tonight and was associated with confusion.  She then began to feel better but the confusion continued.  She lives with her husband. ? ?Amount and/or Complexity of Data Reviewed ?Independent Historian: caregiver ?   Details: Husband arrived to help give history. ?External Data Reviewed: labs, radiology, ECG and notes. ?   Details: She is admitted about 6 weeks ago with metabolic encephalopathy.  That time she had C. difficile enteritis, hypokalemia and AKI.  She was discharged on vancomycin. ?Labs: ordered. ?   Details: CBC and metabolic panel ordered, pending at time of transition of care ?Radiology: ordered and independent interpretation performed. ?   Details: CT head-no acute abnormalities ? ?Risk ?Prescription drug management. ?Decision regarding hospitalization. ?Risk Details: Patient with urinary incontinence and abnormal urinalysis consistent with UTI.  Urine culture pending.  Indication for culture is altered mental status.  Head CT negative today.  Patient with encephalopathy recently and C. difficile enteritis.  Labs ordered to evaluate for those problems. ? ? ?Dr. Ashok Cordia to evaluate for discharge after labs return. ? ? ? ? ? ? ? ?Final Clinical Impression(s) / ED Diagnoses ?Final diagnoses:  ?Urinary tract infection without hematuria, site unspecified  ? ? ?Rx / DC Orders ?ED Discharge Orders   ? ?      Ordered  ?  amoxicillin-clavulanate (AUGMENTIN) 875-125 MG tablet  2 times daily       ? 03/04/22 0032  ? ?  ?  ? ?  ? ? ?  ?Daleen Bo, MD ?03/04/22 [phone] ? ?

## 2022-03-03 NOTE — ED Triage Notes (Signed)
Pt arrived from home via [facility] c/o intermittent back pain. Pt reports difficulty standing up. Pt reports not taking her prescribed medications as instructed and is non-complaint at home. Pt reports increasing weakness, and Hx of chronic back pain.  ?

## 2022-03-04 LAB — CBC WITH DIFFERENTIAL/PLATELET
Abs Immature Granulocytes: 0.04 10*3/uL (ref 0.00–0.07)
Basophils Absolute: 0 10*3/uL (ref 0.0–0.1)
Basophils Relative: 0 %
Eosinophils Absolute: 0 10*3/uL (ref 0.0–0.5)
Eosinophils Relative: 1 %
HCT: 29.7 % — ABNORMAL LOW (ref 36.0–46.0)
Hemoglobin: 10.4 g/dL — ABNORMAL LOW (ref 12.0–15.0)
Immature Granulocytes: 1 %
Lymphocytes Relative: 14 %
Lymphs Abs: 1 10*3/uL (ref 0.7–4.0)
MCH: 32.3 pg (ref 26.0–34.0)
MCHC: 35 g/dL (ref 30.0–36.0)
MCV: 92.2 fL (ref 80.0–100.0)
Monocytes Absolute: 0.5 10*3/uL (ref 0.1–1.0)
Monocytes Relative: 7 %
Neutro Abs: 5.6 10*3/uL (ref 1.7–7.7)
Neutrophils Relative %: 77 %
Platelets: 260 10*3/uL (ref 150–400)
RBC: 3.22 MIL/uL — ABNORMAL LOW (ref 3.87–5.11)
RDW: 13.6 % (ref 11.5–15.5)
WBC: 7.2 10*3/uL (ref 4.0–10.5)
nRBC: 0 % (ref 0.0–0.2)

## 2022-03-04 LAB — COMPREHENSIVE METABOLIC PANEL
ALT: 10 U/L (ref 0–44)
AST: 13 U/L — ABNORMAL LOW (ref 15–41)
Albumin: 3.4 g/dL — ABNORMAL LOW (ref 3.5–5.0)
Alkaline Phosphatase: 77 U/L (ref 38–126)
Anion gap: 14 (ref 5–15)
BUN: 14 mg/dL (ref 8–23)
CO2: 25 mmol/L (ref 22–32)
Calcium: 9 mg/dL (ref 8.9–10.3)
Chloride: 92 mmol/L — ABNORMAL LOW (ref 98–111)
Creatinine, Ser: 1.34 mg/dL — ABNORMAL HIGH (ref 0.44–1.00)
GFR, Estimated: 42 mL/min — ABNORMAL LOW (ref >60)
Glucose, Bld: 97 mg/dL (ref 70–99)
Potassium: 2.8 mmol/L — ABNORMAL LOW (ref 3.5–5.1)
Sodium: 131 mmol/L — ABNORMAL LOW (ref 135–145)
Total Bilirubin: 1.8 mg/dL — ABNORMAL HIGH (ref 0.3–1.2)
Total Protein: 6.6 g/dL (ref 6.5–8.1)

## 2022-03-04 MED ORDER — AMOXICILLIN-POT CLAVULANATE 875-125 MG PO TABS
1.0000 | ORAL_TABLET | Freq: Once | ORAL | Status: AC
  Administered 2022-03-04: 1 via ORAL
  Filled 2022-03-04: qty 1

## 2022-03-04 MED ORDER — AMOXICILLIN-POT CLAVULANATE 875-125 MG PO TABS: 1.0000 | ORAL_TABLET | Freq: Two times a day (BID) | ORAL | 0 refills | Status: DC

## 2022-03-04 MED ORDER — POTASSIUM CHLORIDE CRYS ER 20 MEQ PO TBCR
EXTENDED_RELEASE_TABLET | ORAL | 0 refills | Status: DC
Start: 2022-03-04 — End: 2022-07-05

## 2022-03-04 MED ORDER — POTASSIUM CHLORIDE 20 MEQ PO PACK
40.0000 meq | PACK | Freq: Once | ORAL | Status: AC
  Administered 2022-03-04: 40 meq via ORAL
  Filled 2022-03-04: qty 2

## 2022-03-04 NOTE — ED Provider Notes (Signed)
Signed out by Dr Eulis Foster at 0100 to check pending chem, plan is for d/c home with abx for uti.  ? ?Chem reviewed - k is low. Pt is currently drinking fluids well with no nausea/vomiting. No abd pain, abd soft non tender.  ? ?Additional po fluids. Kcl po. ? ?Pt currently appears stable for d/c. ? ?Return precautions provided.  ?  ?Lajean Saver, MD ?03/04/22 0142 ? ?

## 2022-03-04 NOTE — Discharge Instructions (Addendum)
It was our pleasure to provide your ER care today - we hope that you feel better. ? ?Drink plenty of fluids/stay well hydrated.  ? ?Take antibiotic as prescribed.  ? ?Your potassium level is low (2.8) - eat plenty of fruits and vegetables, take potassium supplement as prescribed, and follow up with primary care doctor in one week. ? ?Return to ER if worse, new symptoms, high fevers, new/severe pain, persistent vomiting, trouble breathing, weak/fainting, or other concern.  ?

## 2022-03-05 LAB — URINE CULTURE: Culture: NO GROWTH

## 2022-03-13 ENCOUNTER — Encounter (HOSPITAL_COMMUNITY): Payer: Self-pay | Admitting: Emergency Medicine

## 2022-03-13 ENCOUNTER — Inpatient Hospital Stay (HOSPITAL_COMMUNITY)
Admission: EM | Admit: 2022-03-13 | Discharge: 2022-03-17 | DRG: 683 | Disposition: A | Discharge status: Skilled Nursing Facility | Payer: Medicare HMO | Attending: Internal Medicine | Admitting: Internal Medicine

## 2022-03-13 ENCOUNTER — Other Ambulatory Visit: Payer: Self-pay

## 2022-03-13 DIAGNOSIS — N179 Acute kidney failure, unspecified: Secondary | ICD-10-CM | POA: Diagnosis not present

## 2022-03-13 DIAGNOSIS — I959 Hypotension, unspecified: Secondary | ICD-10-CM | POA: Diagnosis not present

## 2022-03-13 DIAGNOSIS — E860 Dehydration: Secondary | ICD-10-CM | POA: Diagnosis present

## 2022-03-13 DIAGNOSIS — R64 Cachexia: Secondary | ICD-10-CM | POA: Diagnosis not present

## 2022-03-13 DIAGNOSIS — Z7401 Bed confinement status: Secondary | ICD-10-CM | POA: Diagnosis not present

## 2022-03-13 DIAGNOSIS — E785 Hyperlipidemia, unspecified: Secondary | ICD-10-CM | POA: Diagnosis not present

## 2022-03-13 DIAGNOSIS — E876 Hypokalemia: Secondary | ICD-10-CM | POA: Diagnosis not present

## 2022-03-13 DIAGNOSIS — E871 Hypo-osmolality and hyponatremia: Secondary | ICD-10-CM | POA: Diagnosis not present

## 2022-03-13 DIAGNOSIS — R531 Weakness: Secondary | ICD-10-CM | POA: Diagnosis not present

## 2022-03-13 DIAGNOSIS — R5381 Other malaise: Secondary | ICD-10-CM | POA: Diagnosis not present

## 2022-03-13 DIAGNOSIS — R627 Adult failure to thrive: Secondary | ICD-10-CM | POA: Diagnosis present

## 2022-03-13 DIAGNOSIS — Z79899 Other long term (current) drug therapy: Secondary | ICD-10-CM | POA: Diagnosis not present

## 2022-03-13 DIAGNOSIS — L89892 Pressure ulcer of other site, stage 2: Secondary | ICD-10-CM | POA: Diagnosis present

## 2022-03-13 DIAGNOSIS — Z888 Allergy status to other drugs, medicaments and biological substances status: Secondary | ICD-10-CM

## 2022-03-13 DIAGNOSIS — Z681 Body mass index (BMI) 19 or less, adult: Secondary | ICD-10-CM | POA: Diagnosis not present

## 2022-03-13 DIAGNOSIS — Z1639 Resistance to other specified antimicrobial drug: Secondary | ICD-10-CM | POA: Diagnosis not present

## 2022-03-13 DIAGNOSIS — F1020 Alcohol dependence, uncomplicated: Secondary | ICD-10-CM | POA: Diagnosis present

## 2022-03-13 DIAGNOSIS — Z79890 Hormone replacement therapy (postmenopausal): Secondary | ICD-10-CM | POA: Diagnosis not present

## 2022-03-13 DIAGNOSIS — E038 Other specified hypothyroidism: Secondary | ICD-10-CM

## 2022-03-13 DIAGNOSIS — Z743 Need for continuous supervision: Secondary | ICD-10-CM | POA: Diagnosis not present

## 2022-03-13 DIAGNOSIS — E861 Hypovolemia: Secondary | ICD-10-CM | POA: Diagnosis present

## 2022-03-13 DIAGNOSIS — Z96652 Presence of left artificial knee joint: Secondary | ICD-10-CM | POA: Diagnosis present

## 2022-03-13 DIAGNOSIS — M6281 Muscle weakness (generalized): Secondary | ICD-10-CM | POA: Diagnosis not present

## 2022-03-13 DIAGNOSIS — R54 Age-related physical debility: Secondary | ICD-10-CM | POA: Diagnosis present

## 2022-03-13 DIAGNOSIS — B9620 Unspecified Escherichia coli [E. coli] as the cause of diseases classified elsewhere: Secondary | ICD-10-CM | POA: Diagnosis present

## 2022-03-13 DIAGNOSIS — Z8249 Family history of ischemic heart disease and other diseases of the circulatory system: Secondary | ICD-10-CM

## 2022-03-13 DIAGNOSIS — I129 Hypertensive chronic kidney disease with stage 1 through stage 4 chronic kidney disease, or unspecified chronic kidney disease: Secondary | ICD-10-CM | POA: Diagnosis present

## 2022-03-13 DIAGNOSIS — D649 Anemia, unspecified: Secondary | ICD-10-CM | POA: Diagnosis not present

## 2022-03-13 DIAGNOSIS — I10 Essential (primary) hypertension: Secondary | ICD-10-CM | POA: Diagnosis present

## 2022-03-13 DIAGNOSIS — Z881 Allergy status to other antibiotic agents status: Secondary | ICD-10-CM

## 2022-03-13 DIAGNOSIS — Z87891 Personal history of nicotine dependence: Secondary | ICD-10-CM | POA: Diagnosis not present

## 2022-03-13 DIAGNOSIS — N390 Urinary tract infection, site not specified: Principal | ICD-10-CM | POA: Diagnosis present

## 2022-03-13 DIAGNOSIS — R630 Anorexia: Secondary | ICD-10-CM | POA: Diagnosis present

## 2022-03-13 DIAGNOSIS — N182 Chronic kidney disease, stage 2 (mild): Secondary | ICD-10-CM | POA: Diagnosis present

## 2022-03-13 DIAGNOSIS — N189 Chronic kidney disease, unspecified: Secondary | ICD-10-CM | POA: Diagnosis not present

## 2022-03-13 DIAGNOSIS — E039 Hypothyroidism, unspecified: Secondary | ICD-10-CM | POA: Diagnosis present

## 2022-03-13 DIAGNOSIS — Z8673 Personal history of transient ischemic attack (TIA), and cerebral infarction without residual deficits: Secondary | ICD-10-CM | POA: Diagnosis not present

## 2022-03-13 DIAGNOSIS — N3000 Acute cystitis without hematuria: Secondary | ICD-10-CM | POA: Diagnosis not present

## 2022-03-13 DIAGNOSIS — R0902 Hypoxemia: Secondary | ICD-10-CM | POA: Diagnosis not present

## 2022-03-13 LAB — CBC WITH DIFFERENTIAL/PLATELET
Abs Immature Granulocytes: 0.03 10*3/uL (ref 0.00–0.07)
Basophils Absolute: 0 10*3/uL (ref 0.0–0.1)
Basophils Relative: 1 %
Eosinophils Absolute: 0 10*3/uL (ref 0.0–0.5)
Eosinophils Relative: 0 %
HCT: 28 % — ABNORMAL LOW (ref 36.0–46.0)
Hemoglobin: 9.6 g/dL — ABNORMAL LOW (ref 12.0–15.0)
Immature Granulocytes: 1 %
Lymphocytes Relative: 14 %
Lymphs Abs: 0.8 10*3/uL (ref 0.7–4.0)
MCH: 32 pg (ref 26.0–34.0)
MCHC: 34.3 g/dL (ref 30.0–36.0)
MCV: 93.3 fL (ref 80.0–100.0)
Monocytes Absolute: 0.2 10*3/uL (ref 0.1–1.0)
Monocytes Relative: 5 %
Neutro Abs: 4.3 10*3/uL (ref 1.7–7.7)
Neutrophils Relative %: 79 %
Platelets: 246 10*3/uL (ref 150–400)
RBC: 3 MIL/uL — ABNORMAL LOW (ref 3.87–5.11)
RDW: 14.8 % (ref 11.5–15.5)
WBC: 5.4 10*3/uL (ref 4.0–10.5)
nRBC: 0.4 % — ABNORMAL HIGH (ref 0.0–0.2)

## 2022-03-13 LAB — COMPREHENSIVE METABOLIC PANEL
ALT: 9 U/L (ref 0–44)
AST: 13 U/L — ABNORMAL LOW (ref 15–41)
Albumin: 3.5 g/dL (ref 3.5–5.0)
Alkaline Phosphatase: 62 U/L (ref 38–126)
Anion gap: 16 — ABNORMAL HIGH (ref 5–15)
BUN: 15 mg/dL (ref 8–23)
CO2: 18 mmol/L — ABNORMAL LOW (ref 22–32)
Calcium: 9 mg/dL (ref 8.9–10.3)
Chloride: 97 mmol/L — ABNORMAL LOW (ref 98–111)
Creatinine, Ser: 1.31 mg/dL — ABNORMAL HIGH (ref 0.44–1.00)
GFR, Estimated: 43 mL/min — ABNORMAL LOW (ref >60)
Glucose, Bld: 74 mg/dL (ref 70–99)
Potassium: 3.1 mmol/L — ABNORMAL LOW (ref 3.5–5.1)
Sodium: 131 mmol/L — ABNORMAL LOW (ref 135–145)
Total Bilirubin: 1.7 mg/dL — ABNORMAL HIGH (ref 0.3–1.2)
Total Protein: 6.6 g/dL (ref 6.5–8.1)

## 2022-03-13 LAB — URINALYSIS, ROUTINE W REFLEX MICROSCOPIC
Bilirubin Urine: NEGATIVE
Glucose, UA: NEGATIVE mg/dL
Ketones, ur: 20 mg/dL — AB
Nitrite: NEGATIVE
Protein, ur: NEGATIVE mg/dL
Specific Gravity, Urine: 1.01 (ref 1.005–1.030)
pH: 8 (ref 5.0–8.0)

## 2022-03-13 LAB — MAGNESIUM: Magnesium: 2.2 mg/dL (ref 1.7–2.4)

## 2022-03-13 MED ORDER — HEPARIN SODIUM (PORCINE) 5000 UNIT/ML IJ SOLN
5000.0000 [IU] | Freq: Three times a day (TID) | INTRAMUSCULAR | Status: DC
  Administered 2022-03-14 – 2022-03-17 (×10): 5000 [IU] via SUBCUTANEOUS
  Filled 2022-03-13 (×10): qty 1

## 2022-03-13 MED ORDER — ACETAMINOPHEN 325 MG PO TABS: 650.0000 mg | ORAL_TABLET | Freq: Four times a day (QID) | ORAL | Status: DC | PRN

## 2022-03-13 MED ORDER — LEVOTHYROXINE SODIUM 50 MCG PO TABS: 100.0000 ug | ORAL_TABLET | Freq: Every day | ORAL | Status: DC

## 2022-03-13 MED ORDER — ONDANSETRON HCL 4 MG PO TABS: 4.0000 mg | ORAL_TABLET | Freq: Four times a day (QID) | ORAL | Status: DC | PRN

## 2022-03-13 MED ORDER — LEVOTHYROXINE SODIUM 100 MCG PO TABS
100.0000 ug | ORAL_TABLET | Freq: Every day | ORAL | Status: DC
  Administered 2022-03-14 – 2022-03-15 (×2): 100 ug via ORAL
  Filled 2022-03-13: qty 1
  Filled 2022-03-13: qty 2

## 2022-03-13 MED ORDER — ACETAMINOPHEN 650 MG RE SUPP: 650.0000 mg | Freq: Four times a day (QID) | RECTAL | Status: DC | PRN

## 2022-03-13 MED ORDER — METOPROLOL SUCCINATE ER 50 MG PO TB24
50.0000 mg | ORAL_TABLET | Freq: Every day | ORAL | Status: DC
  Administered 2022-03-14 – 2022-03-17 (×3): 50 mg via ORAL
  Filled 2022-03-13 (×4): qty 1

## 2022-03-13 MED ORDER — SODIUM CHLORIDE 0.9 % IV BOLUS
1000.0000 mL | Freq: Once | INTRAVENOUS | Status: AC
Start: 2022-03-13 — End: 2022-03-13
  Administered 2022-03-13: 1000 mL via INTRAVENOUS

## 2022-03-13 MED ORDER — ONDANSETRON HCL 4 MG/2ML IJ SOLN: 4.0000 mg | Freq: Four times a day (QID) | INTRAMUSCULAR | Status: DC | PRN

## 2022-03-13 MED ORDER — ADULT MULTIVITAMIN W/MINERALS CH
1.0000 | ORAL_TABLET | Freq: Every day | ORAL | Status: DC
  Administered 2022-03-14 – 2022-03-17 (×4): 1 via ORAL
  Filled 2022-03-13 (×4): qty 1

## 2022-03-13 MED ORDER — POTASSIUM CHLORIDE CRYS ER 20 MEQ PO TBCR
40.0000 meq | EXTENDED_RELEASE_TABLET | Freq: Once | ORAL | Status: AC
  Administered 2022-03-13: 40 meq via ORAL
  Filled 2022-03-13: qty 2

## 2022-03-13 MED ORDER — SODIUM CHLORIDE 0.9 % IV SOLN
1.0000 g | INTRAVENOUS | Status: DC
  Administered 2022-03-13 – 2022-03-16 (×4): 1 g via INTRAVENOUS
  Filled 2022-03-13 (×4): qty 10

## 2022-03-13 MED ORDER — SODIUM CHLORIDE 0.9 % IV BOLUS
1000.0000 mL | Freq: Once | INTRAVENOUS | Status: AC
  Administered 2022-03-13: 1000 mL via INTRAVENOUS

## 2022-03-13 MED ORDER — OXYCODONE HCL 5 MG PO TABS: 5.0000 mg | ORAL_TABLET | ORAL | Status: DC | PRN

## 2022-03-13 NOTE — ED Provider Notes (Signed)
?[facility] ?Provider Note ? ? ?CSN: [id number] ?Arrival date & time: 03/13/22  1334 ? ?  ? ?History ? ?Chief Complaint  ?Patient presents with  ?? Weakness  ? ? ?Anye Brose is a 76 y.o. female. ? ?Pt complains of feeling weak.  Patient reports she feels like she has become increasingly weak over the last month.  Husband reports patient has a decreased appetite she has had decreased fluid intake.  He reports that she has been unable to get out of the bed for the past week.  He states patient will not eat anything solid.  He states she did drink water last p.m.  Patient complains of feeling nauseated.  States food does not taste good and makes her feel sick.  Patient's husband reports that she has had an approximate 20 pounds of weight loss over the last month.  Patient was treated for C. difficile 1 month ago.  Finished antibiotics.  Has not had any diarrhea.  Patient's husband states patient has not ate anything in order for her to be able to come out.  Patient denies constipation. ? ?The history is provided by the patient. No language interpreter was used.  ?Weakness ?Severity:  Severe ?Onset quality:  Sudden ?Duration:  4 weeks ?Timing:  Constant ?Progression:  Worsening ?Chronicity:  New ?Context: recent infection   ?Relieved by:  Nothing ?Worsened by:  Nothing ?Ineffective treatments:  None tried ?Associated symptoms: anorexia and nausea   ?Associated symptoms: no abdominal pain   ? ?  ? ?Home Medications ?Prior to Admission medications   ?Medication Sig Start Date End Date Taking? Authorizing Provider  ?levothyroxine (SYNTHROID) 100 MCG tablet Take 1 tablet (100 mcg total) by mouth daily. 09/21/21  Yes Claretta Fraise, MD  ?metoprolol succinate (TOPROL-XL) 50 MG 24 hr tablet Take 1 tablet (50 mg total) by mouth daily. Take with or immediately following a meal. 01/30/22  Yes Johnson, Clanford L, MD  ?potassium chloride SA (KLOR-CON M) 20 MEQ tablet Take one tablet bid x 3 days, then take  one tablet once a day 03/04/22  Yes Lajean Saver, MD  ?amoxicillin-clavulanate (AUGMENTIN) 875-125 MG tablet Take 1 tablet by mouth 2 (two) times daily. One po bid x 7 days ?Patient not taking: Reported on 03/13/2022 03/04/22   Daleen Bo, MD  ?saccharomyces boulardii (FLORASTOR) 250 MG capsule Take 1 capsule (250 mg total) by mouth 2 (two) times daily. ?Patient not taking: Reported on 03/13/2022 01/29/22   Murlean Iba, MD  ?   ? ?Allergies    ?Ciprofloxacin, Procardia [nifedipine], and Norvasc [amlodipine besylate]   ? ?Review of Systems   ?Review of Systems  ?Gastrointestinal:  Positive for anorexia and nausea. Negative for abdominal pain.  ?Neurological:  Positive for weakness.  ?All other systems reviewed and are negative. ? ?Physical Exam ?Updated Vital Signs ?BP (!) 156/94   Pulse 76   Temp (!) 97.4 ?F (36.3 ?C) (Oral)   Resp 14   Ht '5\' 9"'$  (1.753 m)   Wt 59 kg   LMP 08/21/2013   SpO2 100%   BMI 19.20 kg/m?  ?Physical Exam ?Vitals and nursing note reviewed.  ?Constitutional:   ?   Appearance: She is well-developed. She is ill-appearing.  ?   Comments: Thin frail appearing  ?HENT:  ?   Head: Normocephalic.  ?   Nose: Nose normal.  ?   Mouth/Throat:  ?   Mouth: Mucous membranes are dry.  ?Eyes:  ?   Extraocular Movements: Extraocular  movements intact.  ?   Pupils: Pupils are equal, round, and reactive to light.  ?Cardiovascular:  ?   Rate and Rhythm: Normal rate and regular rhythm.  ?Pulmonary:  ?   Effort: Pulmonary effort is normal.  ?Abdominal:  ?   General: Abdomen is flat. There is no distension.  ?   Palpations: Abdomen is soft.  ?Musculoskeletal:     ?   General: Normal range of motion.  ?   Cervical back: Normal range of motion.  ?Skin: ?   General: Skin is warm.  ?Neurological:  ?   General: No focal deficit present.  ?   Mental Status: She is alert and oriented to person, place, and time.  ?Psychiatric:     ?   Mood and Affect: Mood normal.  ? ? ?ED Results / Procedures / Treatments    ?Labs ?(all labs ordered are listed, but only abnormal results are displayed) ?Labs Reviewed  ?CBC WITH DIFFERENTIAL/PLATELET - Abnormal; Notable for the following components:  ?    Result Value  ? RBC 3.00 (*)   ? Hemoglobin 9.6 (*)   ? HCT 28.0 (*)   ? nRBC 0.4 (*)   ? All other components within normal limits  ?COMPREHENSIVE METABOLIC PANEL - Abnormal; Notable for the following components:  ? Sodium 131 (*)   ? Potassium 3.1 (*)   ? Chloride 97 (*)   ? CO2 18 (*)   ? Creatinine, Ser 1.31 (*)   ? AST 13 (*)   ? Total Bilirubin 1.7 (*)   ? GFR, Estimated 43 (*)   ? Anion gap 16 (*)   ? All other components within normal limits  ?URINALYSIS, ROUTINE W REFLEX MICROSCOPIC - Abnormal; Notable for the following components:  ? APPearance CLOUDY (*)   ? Hgb urine dipstick SMALL (*)   ? Ketones, ur 20 (*)   ? Leukocytes,Ua SMALL (*)   ? Bacteria, UA MANY (*)   ? All other components within normal limits  ?MAGNESIUM  ? ? ?EKG ?None ? ?Radiology ?No results found. ? ?Procedures ?Procedures  ? ? ?Medications Ordered in ED ?Medications  ?sodium chloride 0.9 % bolus 1,000 mL (1,000 mLs Intravenous Bolus 03/13/22 1727)  ?sodium chloride 0.9 % bolus 1,000 mL (0 mLs Intravenous Stopped 03/13/22 2037)  ?potassium chloride SA (KLOR-CON M) CR tablet 40 mEq (40 mEq Oral Given 03/13/22 1920)  ? ? ?ED Course/ Medical Decision Making/ A&P ?  ?                        ?Medical Decision Making ?Pt unable to get out of bed.  Pt not eating.  Pt is drinking water  ? ?Problems Addressed: ?Weakness: acute illness or injury ?   Details: Pt had cdiff a month ago.  Pt has increased weakness. ? ?Amount and/or Complexity of Data Reviewed ?Independent Historian: spouse ?   Details: Pt's Husband reports pt is not eating. ?External Data Reviewed: notes. ?   Details: Hospitalist notes reviewed from last admission ?Labs: ordered. Decision-making details documented in ED Course. ?   Details: Sodium is 131.  potassium is 3.1  Hemoglobin 9.6  Ua shows many  bacteria ?Discussion of management or test interpretation with external provider(s): Hospitalist consulted for admission  ? ?Risk ?Decision regarding hospitalization. ?Risk Details: Pt has a uti which may be causing recent increased weakness.  Pt not eating and losing weight.  ? ? ? ? ? ?  ? ? ? ? ?  Final Clinical Impression(s) / ED Diagnoses ?Final diagnoses:  ?Urinary tract infection without hematuria, site unspecified  ?Weakness  ?Dehydration  ?Hypokalemia  ?Anorexia  ?Failure to thrive in adult  ? ? ?Rx / DC Orders ?ED Discharge Orders   ? ? None  ? ?  ? ? ?  ?Fransico Meadow, [place] ?03/13/22 2311 ? ?  ?Daleen Bo, MD ?03/14/22 0011 ? ?

## 2022-03-13 NOTE — Assessment & Plan Note (Addendum)
-   k 2.8, 3.1, >>3.5 today - Secondary to poor p.o. intake ?-40 mEq replaced in ED ?-Encourage regular diet ?-Trending  ?

## 2022-03-13 NOTE — Assessment & Plan Note (Addendum)
-   Not eating at home ?-Progressively weaker, now not ambulating ?-Obtaining PT OT evaluation and recommendations ?-Adding appetite stimulant Megace ?Entertaining possibility of placement ?

## 2022-03-13 NOTE — Assessment & Plan Note (Addendum)
-   Creatinine baseline 0.9 ?- on admission 1.31 >>> 1.22  ?-Secondary to poor p.o. intake/dehydration, possible hypothyroidism ?-2 L bolus given in the ED ?-Continue to encourage p.o. intake ?-Avoid nephrotoxic agents when possible ?

## 2022-03-13 NOTE — ED Triage Notes (Signed)
Pt arrived via [facility] from home c/o generalized weakness and loss of appetite x 1 week. Pt states she was nauseous this morning  ?

## 2022-03-13 NOTE — ED Notes (Signed)
Bladder scanner showed 94m of urine in bladder ?

## 2022-03-13 NOTE — Assessment & Plan Note (Addendum)
-   UA indicative of UTI ?-Urine culture pending ?-We will continue with IV antibiotics of Rocephin ?-Continue to monitor ?

## 2022-03-13 NOTE — Assessment & Plan Note (Addendum)
-   TSH 174.548, significantly elevated ?- Thiamine, B12: Normal at 550 ?-Correct electrolytes ?-PT/OT eval and treat ?-No focal deficits ?-Continue to monitor ?Obtaining free T3-T4, currently hemodynamically stable ?

## 2022-03-13 NOTE — Assessment & Plan Note (Addendum)
-   History of hypothyroidism ?- TSH: 174.5 ?We will check free T4, T3 ?-Continue Synthroid ?Otherwise hemodynamically stable, ?We will reach out for endocrinologist for further recommendations.  ?

## 2022-03-13 NOTE — ED Notes (Signed)
Pt incontinent urine, strong smell of urine noted, pt cleaned up and pericare performed  ?

## 2022-03-13 NOTE — Assessment & Plan Note (Signed)
-   Continue Toprol 

## 2022-03-13 NOTE — Assessment & Plan Note (Addendum)
-   Hypovolemic hyponatremia ?-2 L bolus in ED ? BP stable now  ?

## 2022-03-14 ENCOUNTER — Encounter (HOSPITAL_COMMUNITY): Payer: Self-pay | Admitting: Family Medicine

## 2022-03-14 ENCOUNTER — Observation Stay (HOSPITAL_COMMUNITY): Payer: Medicare HMO

## 2022-03-14 DIAGNOSIS — E871 Hypo-osmolality and hyponatremia: Secondary | ICD-10-CM | POA: Diagnosis present

## 2022-03-14 DIAGNOSIS — N179 Acute kidney failure, unspecified: Secondary | ICD-10-CM | POA: Diagnosis present

## 2022-03-14 DIAGNOSIS — R627 Adult failure to thrive: Secondary | ICD-10-CM

## 2022-03-14 DIAGNOSIS — R54 Age-related physical debility: Secondary | ICD-10-CM | POA: Diagnosis present

## 2022-03-14 DIAGNOSIS — E038 Other specified hypothyroidism: Secondary | ICD-10-CM | POA: Diagnosis not present

## 2022-03-14 DIAGNOSIS — Z681 Body mass index (BMI) 19 or less, adult: Secondary | ICD-10-CM | POA: Diagnosis not present

## 2022-03-14 DIAGNOSIS — N390 Urinary tract infection, site not specified: Secondary | ICD-10-CM | POA: Diagnosis present

## 2022-03-14 DIAGNOSIS — N3000 Acute cystitis without hematuria: Secondary | ICD-10-CM | POA: Diagnosis not present

## 2022-03-14 DIAGNOSIS — I129 Hypertensive chronic kidney disease with stage 1 through stage 4 chronic kidney disease, or unspecified chronic kidney disease: Secondary | ICD-10-CM | POA: Diagnosis present

## 2022-03-14 DIAGNOSIS — L89892 Pressure ulcer of other site, stage 2: Secondary | ICD-10-CM | POA: Diagnosis present

## 2022-03-14 DIAGNOSIS — Z79890 Hormone replacement therapy (postmenopausal): Secondary | ICD-10-CM | POA: Diagnosis not present

## 2022-03-14 DIAGNOSIS — Z8673 Personal history of transient ischemic attack (TIA), and cerebral infarction without residual deficits: Secondary | ICD-10-CM | POA: Diagnosis not present

## 2022-03-14 DIAGNOSIS — E039 Hypothyroidism, unspecified: Secondary | ICD-10-CM | POA: Diagnosis present

## 2022-03-14 DIAGNOSIS — E876 Hypokalemia: Secondary | ICD-10-CM

## 2022-03-14 DIAGNOSIS — R531 Weakness: Secondary | ICD-10-CM | POA: Diagnosis not present

## 2022-03-14 DIAGNOSIS — Z96652 Presence of left artificial knee joint: Secondary | ICD-10-CM | POA: Diagnosis present

## 2022-03-14 DIAGNOSIS — Z1639 Resistance to other specified antimicrobial drug: Secondary | ICD-10-CM | POA: Diagnosis present

## 2022-03-14 DIAGNOSIS — E861 Hypovolemia: Secondary | ICD-10-CM | POA: Diagnosis present

## 2022-03-14 DIAGNOSIS — Z87891 Personal history of nicotine dependence: Secondary | ICD-10-CM | POA: Diagnosis not present

## 2022-03-14 DIAGNOSIS — F1020 Alcohol dependence, uncomplicated: Secondary | ICD-10-CM | POA: Diagnosis present

## 2022-03-14 DIAGNOSIS — E785 Hyperlipidemia, unspecified: Secondary | ICD-10-CM | POA: Diagnosis present

## 2022-03-14 DIAGNOSIS — N182 Chronic kidney disease, stage 2 (mild): Secondary | ICD-10-CM | POA: Diagnosis present

## 2022-03-14 DIAGNOSIS — R64 Cachexia: Secondary | ICD-10-CM | POA: Diagnosis present

## 2022-03-14 DIAGNOSIS — Z79899 Other long term (current) drug therapy: Secondary | ICD-10-CM | POA: Diagnosis not present

## 2022-03-14 DIAGNOSIS — I10 Essential (primary) hypertension: Secondary | ICD-10-CM | POA: Diagnosis not present

## 2022-03-14 DIAGNOSIS — E860 Dehydration: Secondary | ICD-10-CM | POA: Diagnosis present

## 2022-03-14 DIAGNOSIS — B9620 Unspecified Escherichia coli [E. coli] as the cause of diseases classified elsewhere: Secondary | ICD-10-CM | POA: Diagnosis present

## 2022-03-14 DIAGNOSIS — R630 Anorexia: Secondary | ICD-10-CM | POA: Diagnosis present

## 2022-03-14 LAB — CBC
HCT: 26.3 % — ABNORMAL LOW (ref 36.0–46.0)
Hemoglobin: 8.8 g/dL — ABNORMAL LOW (ref 12.0–15.0)
MCH: 30.9 pg (ref 26.0–34.0)
MCHC: 33.5 g/dL (ref 30.0–36.0)
MCV: 92.3 fL (ref 80.0–100.0)
Platelets: 215 10*3/uL (ref 150–400)
RBC: 2.85 MIL/uL — ABNORMAL LOW (ref 3.87–5.11)
RDW: 14.7 % (ref 11.5–15.5)
WBC: 5.1 10*3/uL (ref 4.0–10.5)
nRBC: 0.6 % — ABNORMAL HIGH (ref 0.0–0.2)

## 2022-03-14 LAB — COMPREHENSIVE METABOLIC PANEL
ALT: 8 U/L (ref 0–44)
AST: 12 U/L — ABNORMAL LOW (ref 15–41)
Albumin: 2.9 g/dL — ABNORMAL LOW (ref 3.5–5.0)
Alkaline Phosphatase: 55 U/L (ref 38–126)
Anion gap: 11 (ref 5–15)
BUN: 13 mg/dL (ref 8–23)
CO2: 18 mmol/L — ABNORMAL LOW (ref 22–32)
Calcium: 8.1 mg/dL — ABNORMAL LOW (ref 8.9–10.3)
Chloride: 103 mmol/L (ref 98–111)
Creatinine, Ser: 1.22 mg/dL — ABNORMAL HIGH (ref 0.44–1.00)
GFR, Estimated: 47 mL/min — ABNORMAL LOW (ref >60)
Glucose, Bld: 58 mg/dL — ABNORMAL LOW (ref 70–99)
Potassium: 3.5 mmol/L (ref 3.5–5.1)
Sodium: 132 mmol/L — ABNORMAL LOW (ref 135–145)
Total Bilirubin: 1.1 mg/dL (ref 0.3–1.2)
Total Protein: 5.8 g/dL — ABNORMAL LOW (ref 6.5–8.1)

## 2022-03-14 LAB — TSH
TSH: 100 — AB (ref 0.41–5.90)
TSH: 174.548 u[IU]/mL — ABNORMAL HIGH (ref 0.350–4.500)
TSH: 179.378 u[IU]/mL — ABNORMAL HIGH (ref 0.350–4.500)

## 2022-03-14 LAB — MAGNESIUM: Magnesium: 1.9 mg/dL (ref 1.7–2.4)

## 2022-03-14 LAB — VITAMIN B12: Vitamin B-12: 550 pg/mL (ref 180–914)

## 2022-03-14 LAB — T4, FREE: Free T4: <0.25 ng/dL — ABNORMAL LOW (ref 0.61–1.12)

## 2022-03-14 MED ORDER — SODIUM CHLORIDE 0.9% FLUSH
10.0000 mL | Freq: Two times a day (BID) | INTRAVENOUS | Status: DC
  Administered 2022-03-14 – 2022-03-17 (×6): 10 mL

## 2022-03-14 MED ORDER — MEGESTROL ACETATE 400 MG/10ML PO SUSP
400.0000 mg | Freq: Two times a day (BID) | ORAL | Status: DC
  Administered 2022-03-14 – 2022-03-17 (×7): 400 mg via ORAL
  Filled 2022-03-14 (×7): qty 10

## 2022-03-14 MED ORDER — SODIUM CHLORIDE 0.9 % IV SOLN: INTRAVENOUS | Status: AC

## 2022-03-14 MED ORDER — SODIUM CHLORIDE 0.9% FLUSH: 10.0000 mL | INTRAVENOUS | Status: DC | PRN

## 2022-03-14 MED ORDER — ENSURE ENLIVE PO LIQD
237.0000 mL | Freq: Two times a day (BID) | ORAL | Status: DC
  Administered 2022-03-14 – 2022-03-17 (×7): 237 mL via ORAL

## 2022-03-14 NOTE — Progress Notes (Signed)
?PROGRESS NOTE ? ? ? ?Patient: [name]                            PCP: Claretta Fraise, MD                    ?DOB: Mar 01, 1946            DOA: 03/13/2022 ?HDQ:[id number]             DOS: 03/14/2022, 11:12 AM ? ? LOS: 0 days  ? ?Date of Service: The patient was seen and examined on 03/14/2022 ? ?Subjective:  ? ?The patient was seen and examined this morning. ?Stable at this time. ?Still complaining of : Diffuse generalized weaknesses, unable to ambulate without assist  ? ? ?Brief Narrative:  ? ? ? ?[name] is a 76 y.o. female with medical history significant of with history of acute renal failure superimposed on CKD, recovering alcoholism, B12 deficiency, hyperlipidemia, hypertension, hypothyroid, stroke, and more presents ED with a chief complaint of failure to thrive.   ? ?Family story and patient's story very different.  ?Per Family weak, not walking, not eating, confused, weight loss 20 lbs .  Patient reports that she was very weak but she has been getting stronger.  She reports that she was eating 1 time a day but she has been eating 3 times a day lately.  She is alert and oriented during my exam.   ? ?Family is concerned that they cannot take care of her at home when she is this week. ? Patient was recently discharged from the hospital on February 19.  At that time she was also brought in by her husband for altered mental status.  At that time he also complained that she was confused, but he had reported her to have diarrhea at that time as well.  Patient had significant hypokalemia, and was borderline febrile at presentation without admission.  She had colitis and cholelithiasis on CT. She was diagnosed with C. difficile colitis.  She has since finished a course of vancomycin.  She reports no further diarrhea. ?  ?Patient does not smoke.  She reports she quit drinking about 3 or 4 weeks ago.  She does not use illicit drugs.  She is vaccinated for COVID.  Patient is full code. ? ?ED ?Temp 97.4, heart  rate 74-85, respiratory rate 10-24, blood pressure 139/91, satting 99% ?No leukocytosis with a white blood cell count of 5.4, hemoglobin stable 9.6, platelets 246 ?Chemistry shows a creatinine of 1.31, borderline low glucose of 74 ?T. bili elevated 1.7 ?UA is indicative of UTI ?40 mEq potassium given in the ED ?2 L normal saline given in the ED ?Admission requested for failure to thrive ? ? ? ?Assessment & Plan:  ? ?Principal Problem: ?  AKI (acute kidney injury) ([facility]) ?Active Problems: ?  Essential hypertension ?  Hypokalemia ?  Hypothyroidism ?  UTI (urinary tract infection) ?  Hyponatremia ?  Generalized weakness ?  Failure to thrive in adult ? ? ? ? ?Assessment and Plan: ?* AKI (acute kidney injury) ([facility]) ?- Creatinine baseline 0.9 ?- on admission 1.31 >>> 1.22  ?-Secondary to poor p.o. intake/dehydration, possible hypothyroidism ?-2 L bolus given in the ED ?-Continue to encourage p.o. intake ?-Avoid nephrotoxic agents when possible ? ?Failure to thrive in adult ?- Not eating at home ?-Progressively weaker, now not ambulating ?-Obtaining PT OT evaluation and recommendations ?-Adding appetite stimulant Megace ?Entertaining  possibility of placement ? ?Generalized weakness ?- TSH 174.548, significantly elevated ?- Thiamine, B12: Normal at 550 ?-Correct electrolytes ?-PT/OT eval and treat ?-No focal deficits ?-Continue to monitor ?Obtaining free T3-T4, currently hemodynamically stable ? ?Hyponatremia ?- Hypovolemic hyponatremia ?-2 L bolus in ED ? BP stable now  ? ?UTI (urinary tract infection) ?- UA indicative of UTI ?-Urine culture pending ?-We will continue with IV antibiotics of Rocephin ?-Continue to monitor ? ?Hypothyroidism ?- History of hypothyroidism ?- TSH: 174.5 ?We will check free T4, T3 ?-Continue Synthroid ?Otherwise hemodynamically stable, ?We will reach out for endocrinologist for further recommendations.  ? ?Hypokalemia ?- k 2.8, 3.1, >>3.5 today - Secondary to poor p.o. intake ?-40 mEq replaced in  ED ?-Encourage regular diet ?-Trending  ? ?Essential hypertension ?Continue Toprol ? ? ?------------------------------------------------------------------------------------------ ?Nutritional status:  ?The patient's BMI is: Body mass index is 19.2 kg/m?. ?I agree with the assessment and plan as outlined b ? ?Skin Assessment: ?I have examined the patient's skin and I agree with the wound assessment as performed by wound care team ?As outlined belowe: ?Pressure Injury 03/14/22 Ischial tuberosity Right Stage 2 -  Partial thickness loss of dermis presenting as a shallow open injury with a red, pink wound bed without slough. Dry, pink base   0.75 cm round (Active)  ?03/14/22 0909  ?Location: Ischial tuberosity  ?Location Orientation: Right  ?Staging: Stage 2 -  Partial thickness loss of dermis presenting as a shallow open injury with a red, pink wound bed without slough.  ?Wound Description (Comments): Dry, pink base   0.75 cm round  ?Present on Admission:   ?Dressing Type Foam - Lift dressing to assess site every shift 03/14/22 0826  ? ?----------------------------------------------------------------------------------------------------- ?DVT prophylaxis:  ?heparin injection 5,000 Units Start: 03/13/22 2200 ?SCDs Start: 03/13/22 2159 ? ? ?Code Status:   Code Status: Full Code ? ?Family Communication: Husband at bedside ?The above findings and plan of care has been discussed with patient (and family)  in detail,  ?they expressed understanding and agreement of above. ?-Advance care planning has been discussed.  ? ?Admission status:   ?Status is: Observation ?The patient remains OBS appropriate and will d/c before 2 midnights. ? ? ? ? ?Procedures:  ? ?No admission procedures for hospital encounter. ? ? ?Antimicrobials:  ?Anti-infectives (From admission, onward)  ? ? Start     Dose/Rate Route Frequency Ordered Stop  ? 03/13/22 2200  cefTRIAXone (ROCEPHIN) 1 g in sodium chloride 0.9 % 100 mL IVPB       ? 1 g ?200 mL/hr over  30 Minutes Intravenous Every 24 hours 03/13/22 2133    ? ?  ? ? ? ?Medication:  ? feeding supplement  237 mL Oral BID BM  ? heparin  5,000 Units Subcutaneous Q8H  ? levothyroxine  100 mcg Oral Q0600  ? megestrol  400 mg Oral BID  ? metoprolol succinate  50 mg Oral Daily  ? multivitamin with minerals  1 tablet Oral Daily  ? ? ?acetaminophen **OR** acetaminophen, ondansetron **OR** ondansetron (ZOFRAN) IV, oxyCODONE ? ? ?Objective:  ? ?Vitals:  ? 03/14/22 0630 03/14/22 0700 03/14/22 0752 03/14/22 0928  ?BP: (!) 148/85 (!) 148/94 (!) 142/85 (!) 164/98  ?Pulse: 75 74 76 76  ?Resp: '11 11 16   '$ ?Temp:   98.2 ?F (36.8 ?C)   ?TempSrc:      ?SpO2: 100% 100% 100%   ?Weight:      ?Height:      ? ? ?Intake/Output Summary (Last 24 hours)  at 03/14/2022 1112 ?Last data filed at 03/14/2022 0900 ?Gross per 24 hour  ?Intake 1435.45 ml  ?Output --  ?Net 1435.45 ml  ? ?Filed Weights  ? 03/13/22 1345  ?Weight: 59 kg  ? ? ? ?Examination:  ? ?Physical Exam  ?Constitution:  Alert, cooperative, no distress,  Appears calm and comfortable  ?Psychiatric:   Normal and stable mood and affect, cognition intact,   ?HEENT:        Normocephalic, PERRL, otherwise with in Normal limits  ?Chest:         Chest symmetric ?Cardio vascular:  S1/S2, RRR, No murmure, No Rubs or Gallops  ?pulmonary: Clear to auscultation bilaterally, respirations unlabored, negative wheezes / crackles ?Abdomen: Soft, non-tender, non-distended, bowel sounds,no masses, no organomegaly ?Muscular skeletal: Limited exam -severe generalized weaknesses, unable to ambulate,   ?Neuro: CNII-XII intact. , normal motor and sensation, reflexes intact  ?Extremities: No pitting edema lower extremities, +2 pulses  ?Skin: Dry, warm to touch, negative for any Rashes, No open wounds ?Wounds: per nursing documentation ? ? ?------------------------------------------------------------------------------------------------------------------------------------------ ?  ? ?LABs:  ? ?  Latest Ref Rng & Units  03/14/2022  ?  3:41 AM 03/13/2022  ?  3:21 PM 03/04/2022  ? 12:27 AM  ?CBC  ?WBC 4.0 - 10.5 K/uL 5.1   5.4   7.2    ?Hemoglobin 12.0 - 15.0 g/dL 8.8   9.6   10.4    ?Hematocrit 36.0 - 46.0 % 26.3   28.0   29.7

## 2022-03-14 NOTE — Progress Notes (Cosign Needed)
RE: [name] ?Date Of Birth: 05-27-1946 ?Date: 03/14/2022 ? ?MUST ID: [id number] ? ?To Whom It May Concern:  ? ?Please be advised that the above name patient will require a short-term nursing home stay - anticipated 30 days or less rehabilitation and strengthening. The plan is for return home.  ?

## 2022-03-14 NOTE — TOC Progression Note (Signed)
Pt received [place], [phone] E. Pts name in [place] system is listed as [name].  ?

## 2022-03-14 NOTE — Progress Notes (Signed)
Alert and oriented x 3, thinking it was November. Declined to get up in chair with PT and OT, even knowing that it might mean being recommended for SNF.  Encouraged to eat breakfast but wanted only water at this time.  Stated has lost weight due to not having an appetite, denies nausea.  Lives with son and husband ?

## 2022-03-14 NOTE — Evaluation (Signed)
Physical Therapy Evaluation ?Patient Details ?Name: [name] ?MRN: [id number] ?DOB: 1946/01/23 ?Today's Date: 03/14/2022 ? ?History of Present Illness ? [name] is a 76 y.o. female with medical history significant of with history of acute renal failure superimposed on CKD, recovering alcoholism, B12 deficiency, hyperlipidemia, hypertension, hypothyroid, stroke, and more presents ED with a chief complaint of failure to thrive.  Family story and patient's story very different.  Family reports that patient has not been able to walk.  She has been weaker and weaker.  She is not eating.  She is confused.  Patient reports that she was very weak but she has been getting stronger.  She reports that she was eating 1 time a day but she has been eating 3 times a day lately.  She is alert and oriented during my exam.  Family had also reported the patient had weight loss, first patient says she has not had weight loss, then she thinks she has lost maybe 20 pounds.  She denies being in any pain right now.  She had no nausea or vomiting.  No diarrhea.  Patient does report dysuria that started 3 or 4 weeks ago.  She denies hematuria but reports frequency.  Patient reports that she was having trouble with insomnia, but that is better.  She has taken no intervention to trigger the improvement.  Her insomnia is spontaneously resolved per her report.  Family is concerned that they cannot take care of her at home when she is this week. ?  ?Clinical Impression ? Patient demonstrates slow labored movement for partially sitting up in bed and declined to attempt sit to stands or transferring to chair due to c/o generalized weakness and feeling cold - RN aware.  Patient will benefit from continued skilled physical therapy in hospital and recommended venue below to increase strength, balance, endurance for safe ADLs and gait.    ?   ? ?Recommendations for follow up therapy are one component of a multi-disciplinary discharge planning  process, led by the attending physician.  Recommendations may be updated based on patient status, additional functional criteria and insurance authorization. ? ?Follow Up Recommendations Skilled nursing-short term rehab (<3 hours/day) ? ?  ?Assistance Recommended at Discharge Set up Supervision/Assistance  ?Patient can return home with the following ? A lot of help with bathing/dressing/bathroom;A lot of help with walking and/or transfers;Help with stairs or ramp for entrance;Assistance with cooking/housework ? ?  ?Equipment Recommendations None recommended by PT  ?Recommendations for Other Services ?    ?  ?Functional Status Assessment Patient has had a recent decline in their functional status and demonstrates the ability to make significant improvements in function in a reasonable and predictable amount of time.  ? ?  ?Precautions / Restrictions Precautions ?Precautions: Fall ?Restrictions ?Weight Bearing Restrictions: No  ? ?  ? ?Mobility ? Bed Mobility ?Overal bed mobility: Needs Assistance ?Bed Mobility: Supine to Sit ?  ?  ?Supine to sit: Min assist ?  ?  ?General bed mobility comments: as per OT notes ?  ? ?Transfers ?  ?  ?  ?  ?  ?  ?  ?  ?  ?  ?  ? ?Ambulation/Gait ?  ?  ?  ?  ?  ?  ?  ?  ? ?Stairs ?  ?  ?  ?  ?  ? ?Wheelchair Mobility ?  ? ?Modified Rankin (Stroke Patients Only) ?  ? ?  ? ?Balance Overall balance assessment: Needs assistance ?Sitting-balance support:  Bilateral upper extremity supported, Feet unsupported ?Sitting balance-Leahy Scale: Fair ?Sitting balance - Comments: poor to fair with partial sit in bed ?  ?  ?  ?  ?  ?  ?  ?  ?  ?  ?  ?  ?  ?  ?  ?   ? ? ? ?Pertinent Vitals/Pain Pain Assessment ?Pain Assessment: No/denies pain  ? ? ?Home Living Family/patient expects to be discharged to:: Private residence ?Living Arrangements: Spouse/significant other;Children ?Available Help at Discharge: Family;Available 24 hours/day ?Type of Home: House ?Home Access: Stairs to enter ?Entrance  Stairs-Rails: None ?Entrance Stairs-Number of Steps: 3 ?  ?Home Layout: One level ?Home Equipment: Conservation officer, nature (2 wheels);Cane - single point;Cane - quad;Shower seat;Hand held shower head;Grab bars - tub/shower;Grab bars - toilet ?Additional Comments: Pt reported no change in home living history since recent admission.  ?  ?Prior Function Prior Level of Function : Needs assist ?  ?  ?  ?Physical Assist : Mobility (physical);ADLs (physical) ?Mobility (physical): Transfers ?ADLs (physical): Bathing;Dressing;Toileting;IADLs ?Mobility Comments: uses cane, or RW ; more use of cane per report ?ADLs Comments: PRN assist for bathing, dressing, and toileting; assisted for IADL's. Indepndent for grooming and eating. ?  ? ? ?Hand Dominance  ? Dominant Hand: Left ? ?  ?Extremity/Trunk Assessment  ? Upper Extremity Assessment ?Upper Extremity Assessment: Defer to OT evaluation ?  ? ?Lower Extremity Assessment ?Lower Extremity Assessment: Generalized weakness ?  ? ?Cervical / Trunk Assessment ?Cervical / Trunk Assessment: Normal  ?Communication  ? Communication: HOH  ?Cognition Arousal/Alertness: Awake/alert ?Behavior During Therapy: [facility] for tasks assessed/performed ?Overall Cognitive Status: Within Functional Limits for tasks assessed ?  ?  ?  ?  ?  ?  ?  ?  ?  ?  ?  ?  ?  ?  ?  ?  ?  ?  ?  ? ?  ?General Comments   ? ?  ?Exercises    ? ?Assessment/Plan  ?  ?PT Assessment Patient needs continued PT services  ?PT Problem List Decreased strength;Decreased activity tolerance;Decreased balance;Decreased mobility ? ?   ?  ?PT Treatment Interventions DME instruction;Gait training;Stair training;Functional mobility training;Therapeutic activities;Patient/family education;Balance training   ? ?PT Goals (Current goals can be found in the Care Plan section)  ?Acute Rehab PT Goals ?Patient Stated Goal: return home with family to assist ?PT Goal Formulation: With patient ?Time For Goal Achievement: 03/28/22 ?Potential to Achieve Goals:  Good ? ?  ?Frequency Min 3X/week ?  ? ? ?Co-evaluation PT/OT/SLP Co-Evaluation/Treatment: Yes ?Reason for Co-Treatment: To address functional/ADL transfers ?PT goals addressed during session: Mobility/safety with mobility;Balance;Proper use of DME ?OT goals addressed during session: ADL's and self-care ?  ? ? ?  ?AM-PAC PT "6 Clicks" Mobility  ?Outcome Measure Help needed turning from your back to your side while in a flat bed without using bedrails?: A Little ?Help needed moving from lying on your back to sitting on the side of a flat bed without using bedrails?: A Little ?Help needed moving to and from a bed to a chair (including a wheelchair)?: Total ?Help needed standing up from a chair using your arms (e.g., wheelchair or bedside chair)?: Total ?Help needed to walk in hospital room?: Total ?Help needed climbing 3-5 steps with a railing? : Total ?6 Click Score: 10 ? ?  ?End of Session   ?Activity Tolerance: Patient limited by fatigue ?Patient left: in bed;with call bell/phone within reach ?Nurse Communication: Mobility status ?PT Visit Diagnosis: Unsteadiness  on feet (R26.81);Other abnormalities of gait and mobility (R26.89);Muscle weakness (generalized) (M62.81) ?  ? ?Time: 4383-7793 ?PT Time Calculation (min) (ACUTE ONLY): 20 min ? ? ?Charges:   PT Evaluation ?$PT Eval Moderate Complexity: 1 Mod ?PT Treatments ?$Therapeutic Activity: 8-22 mins ?  ?   ? ? ?12:25 PM, 03/14/22 ?Lonell Grandchild, MPT ?Physical Therapist with [place] ?[facility] ?[phone] office ?[phone] mobile phone ? ? ?

## 2022-03-14 NOTE — Plan of Care (Signed)
?  Problem: Acute Rehab PT Goals(only PT should resolve) ?Goal: Pt Will Go Supine/Side To Sit ?Outcome: Progressing ?Flowsheets (Taken 03/14/2022 1226) ?Pt will go Supine/Side to Sit: with supervision ?Goal: Patient Will Transfer Sit To/From Stand ?Outcome: Progressing ?Flowsheets (Taken 03/14/2022 1226) ?Patient will transfer sit to/from stand: with supervision ?Goal: Pt Will Transfer Bed To Chair/Chair To Bed ?Outcome: Progressing ?Flowsheets (Taken 03/14/2022 1226) ?Pt will Transfer Bed to Chair/Chair to Bed: ? with min assist ? min guard assist ?Goal: Pt Will Ambulate ?Outcome: Progressing ?Flowsheets (Taken 03/14/2022 1226) ?Pt will Ambulate: ? 50 feet ? with min guard assist ? with minimal assist ? with rolling walker ?  ?12:26 PM, 03/14/22 ?Lonell Grandchild, MPT ?Physical Therapist with  ?[facility] ?[phone] office ?[phone] mobile phone ? ?

## 2022-03-14 NOTE — H&P (Signed)
?History and Physical  ? ? ?Patient: [name] YPP:[id number] DOB: 10-07-1946 ?DOA: 03/13/2022 ?DOS: the patient was seen and examined on 03/14/2022 ?PCP: Claretta Fraise, MD  ?Patient coming from: Home ? ?Chief Complaint:  ?Chief Complaint  ?Patient presents with  ? Weakness  ? ?HPI: [name] is a 76 y.o. female with medical history significant of with history of acute renal failure superimposed on CKD, recovering alcoholism, B12 deficiency, hyperlipidemia, hypertension, hypothyroid, stroke, and more presents ED with a chief complaint of failure to thrive.  Family story and patient's story very different.  Family reports that patient has not been able to walk.  She has been weaker and weaker.  She is not eating.  She is confused.  Patient reports that she was very weak but she has been getting stronger.  She reports that she was eating 1 time a day but she has been eating 3 times a day lately.  She is alert and oriented during my exam.  Family had also reported the patient had weight loss, first patient says she has not had weight loss, then she thinks she has lost maybe 20 pounds.  She denies being in any pain right now.  She had no nausea or vomiting.  No diarrhea.  Patient does report dysuria that started 3 or 4 weeks ago.  She denies hematuria but reports frequency.  Patient reports that she was having trouble with insomnia, but that is better.  She has taken no intervention to trigger the improvement.  Her insomnia is spontaneously resolved per her report.  Family is concerned that they cannot take care of her at home when she is this week. ? ?Of note patient recently discharged from the hospital on February 19.  At that time she was also brought in by her husband for altered mental status.  At that time he also complained that she was confused, but he had reported her to have diarrhea at that time as well.  Patient had significant hypokalemia, and was borderline febrile at presentation without  admission.  She had colitis and cholelithiasis on CT.  She was diagnosed with C. difficile colitis.  She has since finished a course of vancomycin.  She reports no further diarrhea. ? ?Patient does not smoke.  She reports she quit drinking about 3 or 4 weeks ago.  She does not use illicit drugs.  She is vaccinated for COVID.  Patient is full code. ?Review of Systems: unable to review all systems due to the inability of the patient to answer questions. ?Past Medical History:  ?Diagnosis Date  ? Acute pyelonephritis 10/15/2019  ? Acute renal failure superimposed on stage 2 chronic kidney disease ([place]) 10/15/2019  ? Alcoholism ([place]) 01/09/2012  ? Anxiety   ? Arthritis   ? Left knee  ? B12 deficiency   ? Carotid artery narrowing 10/03/2012  ? On the right.  ? Chronic kidney disease (CKD), stage II (mild)   ? Depression   ? Headache(784.0)   ? Hyperlipidemia   ? Hypertension   ? Hyponatremia 01/09/2012  ? Hypothyroid   ? Incontinence of urine in female   ? Menopause   ? Mini stroke   ? Orthostatic syncope 10/02/2012  ? SBO (small bowel obstruction) ([place]) 06/28/2016  ? Stroke [facility]) 2010  ? no deficits  ? Thrombocytopenia ([place]) 10/15/2019  ? ?Past Surgical History:  ?Procedure Laterality Date  ? AGILE CAPSULE N/A 02/22/2016  ? dummy capsule remained in the distal ileum  ? APPENDECTOMY    ?  CATARACT EXTRACTION W/PHACO  06/20/2012  ? Procedure: CATARACT EXTRACTION PHACO AND INTRAOCULAR LENS PLACEMENT (IOC);  Surgeon: Tonny Branch, MD;  Location: [facility];  Service: Ophthalmology;  Laterality: Right;  CDE:10.66  ? CATARACT EXTRACTION W/PHACO  07/11/2012  ? Procedure: CATARACT EXTRACTION PHACO AND INTRAOCULAR LENS PLACEMENT (IOC);  Surgeon: Tonny Branch, MD;  Location: [facility];  Service: Ophthalmology;  Laterality: Left;  CDE: 12.69  ? COLONOSCOPY N/A 12/17/2015  ? RMR: Marked circumferential ulceration of the ascending colon/Ileocecal valve ulceration with biopsy most consistent with NSAID injury versus inflammatory bowel disease,  pancolonoc diverticulosis redundant colon  ? ESOPHAGOGASTRODUODENOSCOPY N/A 12/17/2015  ? [id number] gastric polyp, biopsy benign  ? KNEE ARTHROSCOPY Right 09/2016  ? LUMBAR LAMINECTOMY/DECOMPRESSION MICRODISCECTOMY N/A 05/25/2017  ? Procedure: CENTRAL DECOMPRESSIVE LUMBAR LAMINECTOMY FOR SPINAL STENOSIS L3-L4, FORAMINOTOMY FOR THE L4 ROOT AND L5 ROOT;  Surgeon: Latanya Maudlin, MD;  Location: [facility];  Service: Orthopedics;  Laterality: N/A;  ? TONSILLECTOMY    ? TOTAL KNEE ARTHROPLASTY Left 10/31/2021  ? Procedure: TOTAL KNEE ARTHROPLASTY;  Surgeon: Gaynelle Arabian, MD;  Location: [facility];  Service: Orthopedics;  Laterality: Left;  ? ?Social History:  reports that she quit smoking about 29 years ago. Her smoking use included cigarettes. She has a 60.00 pack-year smoking history. She has never used smokeless tobacco. She reports that she does not drink alcohol and does not use drugs. ? ?Allergies  ?Allergen Reactions  ? Ciprofloxacin Other (See Comments)  ?  Caused pt to have delusional thoughts and hallucinations  ? Procardia [Nifedipine] Swelling  ?  Edema  ?  ? Norvasc [Amlodipine Besylate] Hives  ? ? ?Family History  ?Problem Relation Age of Onset  ? GER disease Mother   ? Ulcers Mother   ? Hypertension Maternal Grandmother   ? ? ?Prior to Admission medications   ?Medication Sig Start Date End Date Taking? Authorizing Provider  ?levothyroxine (SYNTHROID) 100 MCG tablet Take 1 tablet (100 mcg total) by mouth daily. 09/21/21  Yes Claretta Fraise, MD  ?metoprolol succinate (TOPROL-XL) 50 MG 24 hr tablet Take 1 tablet (50 mg total) by mouth daily. Take with or immediately following a meal. 01/30/22  Yes Johnson, Clanford L, MD  ?potassium chloride SA (KLOR-CON M) 20 MEQ tablet Take one tablet bid x 3 days, then take one tablet once a day 03/04/22  Yes Lajean Saver, MD  ?amoxicillin-clavulanate (AUGMENTIN) 875-125 MG tablet Take 1 tablet by mouth 2 (two) times daily. One po bid x 7 days ?Patient not taking: Reported on  03/13/2022 03/04/22   Daleen Bo, MD  ?saccharomyces boulardii (FLORASTOR) 250 MG capsule Take 1 capsule (250 mg total) by mouth 2 (two) times daily. ?Patient not taking: Reported on 03/13/2022 01/29/22   Murlean Iba, MD  ? ? ?Physical Exam: ?Vitals:  ? 03/13/22 2100 03/13/22 2130 03/14/22 0130 03/14/22 0230  ?BP: (!) 139/91 (!) 148/99 (!) 144/87 (!) 143/92  ?Pulse: 74 75 68 79  ?Resp: '10 10 11 13  '$ ?Temp:      ?TempSrc:      ?SpO2: 99% 100% 98% 100%  ?Weight:      ?Height:      ? ?1.  General: ?Patient lying supine in bed,  no acute distress ?  ?2. Psychiatric: ?Alert and oriented x 3, mood and behavior normal for situation, pleasant and cooperative with exam ?  ?3. Neurologic: ?Speech and language are normal, face is symmetric, moves all 4 extremities voluntarily, at baseline without acute deficits on limited exam ?  ?  4. HEENMT:  ?Head is atraumatic, normocephalic, pupils reactive to light, neck is cachectic, trachea is midline, mucous membranes are moist ?  ?5. Respiratory : ?Lungs are clear to auscultation bilaterally without wheezing, rhonchi, rales, no cyanosis, no increase in work of breathing or accessory muscle use ?  ?6. Cardiovascular : ?Heart rate normal, rhythm is regular, no murmurs, rubs or gallops, no peripheral edema, peripheral pulses palpated ?  ?7. Gastrointestinal:  ?Abdomen is soft, nondistended, nontender to palpation bowel sounds active, no masses or organomegaly palpated ?  ?8. Skin:  ?Skin is warm, dry and intact without rashes, acute lesions, or ulcers on limited exam ?  ?9.Musculoskeletal:  ?No acute deformities or trauma, no asymmetry in tone, no peripheral edema, peripheral pulses palpated, no tenderness to palpation in the extremities ? ?Data Reviewed: ?In the ED ?Temp 97.4, heart rate 74-85, respiratory rate 10-24, blood pressure 139/91, satting 99% ?No leukocytosis with a white blood cell count of 5.4, hemoglobin stable 9.6, platelets 246 ?Chemistry shows a creatinine of 1.31,  borderline low glucose of 74 ?T. bili elevated 1.7 ?UA is indicative of UTI ?40 mEq potassium given in the ED ?2 L normal saline given in the ED ?Admission requested for failure to thrive ? ?Assessment and Plan: ?* AKI

## 2022-03-14 NOTE — TOC Progression Note (Signed)
CSW spoke with [facility] admissions who states that they will be able to accept pt to their facility. Facility will start auth as pt is not managed by [facility]. TOC to follow.  ?

## 2022-03-14 NOTE — NC FL2 (Signed)
?[place] MEDICAID FL2 LEVEL OF CARE SCREENING TOOL  ?  ? ?IDENTIFICATION  ?Patient Name: ?[name] Birthdate: [birth year] Sex: female Admission Date (Current Location): ?03/13/2022  ?[place] and [place] Number: ? [place] and Address:  ?[place] [address] [place] ?     Provider Number: ?[id number]  ?Attending Physician Name and Address:  ?Deatra James, MD ? Relative Name and Phone Number:  ?  ?   ?Current Level of Care: ?Hospital Recommended Level of Care: ?[place] Prior Approval Number: ?  ? ?Date Approved/Denied: ?  PASRR Number: ?  ? ?Discharge Plan: ?SNF ?  ? ?Current Diagnoses: ?Patient Active Problem List  ? Diagnosis Date Noted  ? AKI (acute kidney injury) ([place]) 03/13/2022  ? Generalized weakness 03/13/2022  ? Failure to thrive in adult 03/13/2022  ? Hypoglycemia 01/28/2022  ? Cholelithiases 01/28/2022  ? C. difficile colitis 01/28/2022  ? Nausea vomiting and diarrhea 01/27/2022  ? Acute metabolic encephalopathy 06/05/9484  ? OA (osteoarthritis) of knee 10/31/2021  ? Primary osteoarthritis of left knee 10/31/2021  ? Ileitis   ? Arthritis   ? Hyponatremia 04/21/2019  ? Spinal stenosis, lumbar region with neurogenic claudication 05/25/2017  ? Sinus tachycardia 06/29/2016  ? Transaminitis 03/10/2016  ? Pelvic mass 01/27/2016  ? Colonic ulcer   ? Diverticulosis of colon without hemorrhage   ? Iron deficiency anemia due to chronic blood loss   ? Lumbar pain 12/14/2015  ? UTI (urinary tract infection) 04/28/2015  ? Chronic kidney disease (CKD), stage II (mild) 10/03/2012  ? Carotid artery narrowing 10/03/2012  ? Hypokalemia 10/02/2012  ? Hypothyroidism 10/02/2012  ? Essential hypertension 01/09/2012  ? Hyperlipidemia 01/09/2012  ? B12 deficiency 01/09/2012  ? ? ?Orientation RESPIRATION BLADDER Height & Weight   ?  ?Self, Time, Place ? Normal Continent, External catheter Weight: 130 lb (59 kg) ?Height:  '5\' 9"'$  (175.3 cm)  ?BEHAVIORAL  SYMPTOMS/MOOD NEUROLOGICAL BOWEL NUTRITION STATUS  ?    Continent Diet (See D/C Summary)  ?AMBULATORY STATUS COMMUNICATION OF NEEDS Skin   ?Extensive Assist Verbally Other (Comment) (Ischial tuberosity right stage 2) ?  ?  ?  ?    ?     ?     ? ? ?Personal Care Assistance Level of Assistance  ?Bathing, Feeding, Dressing Bathing Assistance: Limited assistance ?Feeding assistance: Independent ?Dressing Assistance: Limited assistance ?   ? ?Functional Limitations Info  ?Sight, Hearing, Speech Sight Info: Impaired ?Hearing Info: Adequate ?Speech Info: Adequate  ? ? ?SPECIAL CARE FACTORS FREQUENCY  ?PT (By licensed PT), OT (By licensed OT)   ?  ?PT Frequency: 5 times weekly ?OT Frequency: 5 times weekly ?  ?  ?  ?   ? ? ?Contractures Contractures Info: Not present  ? ? ?Additional Factors Info  ?Code Status, Allergies Code Status Info: FULL ?Allergies Info: Ciprofloxacin, Procardia (Nifedipine), Norvasc (Amlodipine Besylate) ?  ?  ?  ?   ? ?Current Medications (03/14/2022):  This is the current hospital active medication list ?Current Facility-Administered Medications  ?Medication Dose Route Frequency Provider Last Rate Last Admin  ? 0.9 %  sodium chloride infusion   Intravenous Continuous Skipper Cliche A, MD 100 mL/hr at 03/14/22 0851 New Bag at 03/14/22 0851  ? acetaminophen (TYLENOL) tablet 650 mg  650 mg Oral Q6H PRN Zierle-Ghosh, Asia B, DO      ? Or  ? acetaminophen (TYLENOL) suppository 650 mg  650 mg Rectal Q6H PRN Zierle-Ghosh, [place] B,  DO      ? cefTRIAXone (ROCEPHIN) 1 g in sodium chloride 0.9 % 100 mL IVPB  1 g Intravenous Q24H Zierle-Ghosh, Asia B, DO   Stopped at 03/13/22 2220  ? heparin injection 5,000 Units  5,000 Units Subcutaneous Q8H Zierle-Ghosh, Asia B, DO   5,000 Units at 03/14/22 6761  ? levothyroxine (SYNTHROID) tablet 100 mcg  100 mcg Oral Q0600 Zierle-Ghosh, Asia B, DO   100 mcg at 03/14/22 9509  ? megestrol (MEGACE) 400 MG/10ML suspension 400 mg  400 mg Oral BID Skipper Cliche A, MD   400 mg  at 03/14/22 3267  ? metoprolol succinate (TOPROL-XL) 24 hr tablet 50 mg  50 mg Oral Daily Zierle-Ghosh, Asia B, DO   50 mg at 03/14/22 1245  ? multivitamin with minerals tablet 1 tablet  1 tablet Oral Daily Zierle-Ghosh, Asia B, DO   1 tablet at 03/14/22 0929  ? ondansetron (ZOFRAN) tablet 4 mg  4 mg Oral Q6H PRN Zierle-Ghosh, Asia B, DO      ? Or  ? ondansetron (ZOFRAN) injection 4 mg  4 mg Intravenous Q6H PRN Zierle-Ghosh, Asia B, DO      ? oxyCODONE (Oxy IR/ROXICODONE) immediate release tablet 5 mg  5 mg Oral Q4H PRN Zierle-Ghosh, Asia B, DO      ? ? ? ?Discharge Medications: ?Please see discharge summary for a list of discharge medications. ? ?Relevant Imaging Results: ? ?Relevant Lab Results: ? ? ?Additional Information ?SSN: [id number] ? ?Iona Beard, LCSWA ? ? ? ? ?

## 2022-03-14 NOTE — Hospital Course (Addendum)
? ?  [name] is a 76 y.o. female with medical history significant of with history of acute renal failure superimposed on CKD, recovering alcoholism, B12 deficiency, hyperlipidemia, hypertension, hypothyroid, stroke, and more presents ED with a chief complaint of failure to thrive.   ? ?Family story and patient's story very different.  ?Per Family weak, not walking, not eating, confused, weight loss 20 lbs .  Patient reports that she was very weak but she has been getting stronger.  She reports that she was eating 1 time a day but she has been eating 3 times a day lately.  She is alert and oriented during my exam.   ? ?Family is concerned that they cannot take care of her at home when she is this week. ? Patient was recently discharged from the hospital on February 19.  At that time she was also brought in by her husband for altered mental status.  At that time he also complained that she was confused, but he had reported her to have diarrhea at that time as well.  Patient had significant hypokalemia, and was borderline febrile at presentation without admission.  She had colitis and cholelithiasis on CT. She was diagnosed with C. difficile colitis.  She has since finished a course of vancomycin.  She reports no further diarrhea. ?  ?Patient does not smoke.  She reports she quit drinking about 3 or 4 weeks ago.  She does not use illicit drugs.  She is vaccinated for COVID.  Patient is full code. ? ?ED ?Temp 97.4, heart rate 74-85, respiratory rate 10-24, blood pressure 139/91, satting 99% ?No leukocytosis with a white blood cell count of 5.4, hemoglobin stable 9.6, platelets 246 ?Chemistry shows a creatinine of 1.31, borderline low glucose of 74 ?T. bili elevated 1.7 ?UA is indicative of UTI ?40 mEq potassium given in the ED ?2 L normal saline given in the ED ?Admission requested for failure to thrive ?

## 2022-03-14 NOTE — Plan of Care (Signed)
?  Problem: Acute Rehab OT Goals (only OT should resolve) ?Goal: Pt. Will Perform Grooming ?Flowsheets (Taken 03/14/2022 0903) ?Pt Will Perform Grooming: ? sitting ? with supervision ?Goal: Pt. Will Perform Upper Body Dressing ?Flowsheets (Taken 03/14/2022 0903) ?Pt Will Perform Upper Body Dressing: ? with modified independence ? sitting ?Goal: Pt. Will Perform Lower Body Dressing ?Flowsheets (Taken 03/14/2022 0903) ?Pt Will Perform Lower Body Dressing: ? with modified independence ? sitting/lateral leans ?Goal: Pt. Will Transfer To Toilet ?Flowsheets (Taken 03/14/2022 0903) ?Pt Will Transfer to Toilet: ? with min guard assist ? stand pivot transfer ?Goal: Pt. Will Perform Toileting-Clothing Manipulation ?Flowsheets (Taken 03/14/2022 0903) ?Pt Will Perform Toileting - Clothing Manipulation and hygiene: ? with min guard assist ? with min assist ? sitting/lateral leans ?Goal: Pt/Caregiver Will Perform Home Exercise Program ?Flowsheets (Taken 03/14/2022 [phone]) ?Pt/caregiver will Perform Home Exercise Program: ? Increased ROM ? Increased strength ? Both right and left upper extremity ? With Supervision ? Neziah Vogelgesang OT, MOT ? ?

## 2022-03-14 NOTE — TOC Initial Note (Signed)
Transition of Care (TOC) - Initial/Assessment Note  ? ? ?Patient Details  ?Name: [name] ?MRN: [id number] ?Date of Birth: [birth year] ? ?Transition of Care (TOC) CM/SW Contact:    ?Salome Arnt, LCSW ?Phone Number: ?03/14/2022, 10:24 AM ? ?Clinical Narrative:  Pt admitted due to acute kidney injury. Pt reports she lives with her husband and her son. They assist with ADLs. Pt states she has been feeling weak recently. PT evaluated pt and recommend SNF. Discussed placement process and pt is agreeable. She requests [facility] if possible. Pt states she will discuss with her family. CMA to start authorization. TOC will follow up with bed offers when available.                 ? ? ?Expected Discharge Plan: [place] ?Barriers to Discharge: Continued Medical Work up ? ? ?Patient Goals and CMS Choice ?Patient states their goals for this hospitalization and ongoing recovery are:: return home ?  ?Choice offered to / list presented to : Patient ? ?Expected Discharge Plan and Services ?Expected Discharge Plan: [place] ?In-house Referral: Clinical Social Work ?  ?Post Acute Care Choice: [place] ?Living arrangements for the past 2 months: [place] ?                ?  ?  ?  ?  ?  ?  ?  ?  ?  ?  ? ?Prior Living Arrangements/Services ?Living arrangements for the past 2 months: [place] ?Lives with:: Spouse, Adult Children ?Patient language and need for interpreter reviewed:: Yes ?Do you feel safe going back to the place where you live?: Yes      ?Need for Family Participation in Patient Care: Yes (Comment) ?Care giver support system in place?: Yes (comment) ?Current home services: DME (walker, cane, 3N1) ?Criminal Activity/Legal Involvement Pertinent to Current Situation/Hospitalization: No - Comment as needed ? ?Activities of Daily Living ?  ?  ? ?Permission Sought/Granted ?  ?  ?   ?   ?   ?   ? ?Emotional Assessment ?  ?  ?Affect (typically  observed): Appropriate ?Orientation: : Oriented to Self, Oriented to Place, Oriented to Situation ?Alcohol / Substance Use: Not Applicable ?Psych Involvement: No (comment) ? ?Admission diagnosis:  Anorexia [R63.0] ?Dehydration [E86.0] ?Hypokalemia [E87.6] ?Weakness [R53.1] ?Failure to thrive in adult [R62.7] ?AKI (acute kidney injury) ([place]) [N17.9] ?Urinary tract infection without hematuria, site unspecified [N39.0] ?Patient Active Problem List  ? Diagnosis Date Noted  ? AKI (acute kidney injury) ([place]) 03/13/2022  ? Generalized weakness 03/13/2022  ? Failure to thrive in adult 03/13/2022  ? Hypoglycemia 01/28/2022  ? Cholelithiases 01/28/2022  ? C. difficile colitis 01/28/2022  ? Nausea vomiting and diarrhea 01/27/2022  ? Acute metabolic encephalopathy 22/01/5426  ? OA (osteoarthritis) of knee 10/31/2021  ? Primary osteoarthritis of left knee 10/31/2021  ? Ileitis   ? Arthritis   ? Hyponatremia 04/21/2019  ? Spinal stenosis, lumbar region with neurogenic claudication 05/25/2017  ? Sinus tachycardia 06/29/2016  ? Transaminitis 03/10/2016  ? Pelvic mass 01/27/2016  ? Colonic ulcer   ? Diverticulosis of colon without hemorrhage   ? Iron deficiency anemia due to chronic blood loss   ? Lumbar pain 12/14/2015  ? UTI (urinary tract infection) 04/28/2015  ? Chronic kidney disease (CKD), stage II (mild) 10/03/2012  ? Carotid artery narrowing 10/03/2012  ? Hypokalemia 10/02/2012  ? Hypothyroidism 10/02/2012  ? Essential hypertension 01/09/2012  ? Hyperlipidemia 01/09/2012  ?  B12 deficiency 01/09/2012  ? ?PCP:  Claretta Fraise, MD ?Pharmacy:   ?[facility] ?[place] [address] ?Phone: [phone] Fax: [phone] ? ?[facility] [address] ?Phone: [phone] Fax: [phone] ? ? ? ? ?Social Determinants of Health (SDOH) Interventions ?  ? ?Readmission Risk Interventions ?    ? View : No data to display.  ?  ?  ?  ? ? ? ?

## 2022-03-14 NOTE — Evaluation (Signed)
Occupational Therapy Evaluation ?Patient Details ?Name: [name] ?MRN: [id number] ?DOB: 1946/08/23 ?Today's Date: 03/14/2022 ? ? ?History of Present Illness [name] is a 76 y.o. female with medical history significant of with history of acute renal failure superimposed on CKD, recovering alcoholism, B12 deficiency, hyperlipidemia, hypertension, hypothyroid, stroke, and more presents ED with a chief complaint of failure to thrive.  Family story and patient's story very different.  Family reports that patient has not been able to walk.  She has been weaker and weaker.  She is not eating.  She is confused.  Patient reports that she was very weak but she has been getting stronger.  She reports that she was eating 1 time a day but she has been eating 3 times a day lately.  She is alert and oriented during my exam.  Family had also reported the patient had weight loss, Gross patient says she has not had weight loss, then she thinks she has lost maybe 20 pounds.  She denies being in any pain right now.  She had no nausea or vomiting.  No diarrhea.  Patient does report dysuria that started 3 or 4 weeks ago.  She denies hematuria but reports frequency.  Patient reports that she was having trouble with insomnia, but that is better.  She has taken no intervention to trigger the improvement.  Her insomnia is spontaneously resolved per her report.  Family is concerned that they cannot take care of her at home when she is this week. (Per MD note)  ? ?Clinical Impression ?  ?Pt initially agreeable to OT and PT evaluation. Pt reports assist as needed for bathing, dressing, and toileting at baseline by family. Pt able to partially sit at EOB without physical assist but very labored movement. Pt reported being cold and wishing to return to supine. Pt observed to bear weight on B UE during bed mobility but appears to be very weak per the slow labored movement. Pt returned to supine due to request. Pt will benefit from  continued OT in the hospital and recommended venue below to increase strength, balance, and endurance for safe ADL's.  ? ? ?   ? ?Recommendations for follow up therapy are one component of a multi-disciplinary discharge planning process, led by the attending physician.  Recommendations may be updated based on patient status, additional functional criteria and insurance authorization.  ? ?Follow Up Recommendations ? Skilled nursing-short term rehab (<3 hours/day)  ?  ?Assistance Recommended at Discharge Intermittent Supervision/Assistance  ?Patient can return home with the following A lot of help with walking and/or transfers;A lot of help with bathing/dressing/bathroom;Assistance with cooking/housework;Assist for transportation;Help with stairs or ramp for entrance ? ?  ?Functional Status Assessment ? Patient has had a recent decline in their functional status and demonstrates the ability to make significant improvements in function in a reasonable and predictable amount of time.  ?Equipment Recommendations ? None recommended by OT  ?  ?Recommendations for Other Services   ? ? ?  ?Precautions / Restrictions Precautions ?Precautions: Fall ?Restrictions ?Weight Bearing Restrictions: No  ? ?  ? ?Mobility Bed Mobility ?Overal bed mobility: Needs Assistance ?Bed Mobility: Supine to Sit ?  ?  ?Supine to sit: Min assist (Unable to reach full sit due to pt desire to return to supine.) ?  ?  ?General bed mobility comments: Very slow labored movement for partial sit at EOB. ?  ? ? ?  ?  ?  ?  ?  ?  ?  ?  ?  ?  ?  ? ?  ?  Balance Overall balance assessment: Needs assistance ?Sitting-balance support: Bilateral upper extremity supported, Feet unsupported ?Sitting balance-Leahy Scale: Fair ?Sitting balance - Comments: poor to fair with partial sit in bed ?  ?  ?  ?  ?  ?  ?  ?  ?  ?  ?  ?  ?  ?  ?  ?   ? ?ADL either performed or assessed with clinical judgement  ? ?ADL Overall ADL's : Needs assistance/impaired ?  ?  ?Grooming: Min  guard;Minimal assistance;Sitting ?  ?  ?  ?  ?  ?Upper Body Dressing : Minimal assistance;Sitting;Min guard ?  ?Lower Body Dressing: Moderate assistance;Maximal assistance;Sitting/lateral leans ?  ?  ?  ?Toileting- Clothing Manipulation and Hygiene: Moderate assistance;Sitting/lateral lean;Maximal assistance ?  ?  ?  ?  ?General ADL Comments: Pt demosntrates very slow labored movement during bed mobility. Unable to fully sit at EOB with pt seeking to return to supine.  ? ? ? ?Vision Baseline Vision/History: 1 Wears glasses ?Ability to See in Adequate Light: 1 Impaired ?Patient Visual Report: No change from baseline ?Vision Assessment?: No apparent visual deficits ?Additional Comments: Per observation during bed mobility  ?   ?   ?  ?   ?  ? ?Pertinent Vitals/Pain Pain Assessment ?Pain Assessment: No/denies pain  ? ? ? ?Hand Dominance Left ?  ?Extremity/Trunk Assessment Upper Extremity Assessment ?Upper Extremity Assessment: Generalized weakness (Per observation of pt weight bearing and functional use of B UE. Further assessment needed when pt more willing to participate.) ?  ?Lower Extremity Assessment ?Lower Extremity Assessment: Defer to PT evaluation ?  ?  ?  ?Communication Communication ?Communication: HOH ?  ?Cognition Arousal/Alertness: Awake/alert ?Behavior During Therapy: [facility] for tasks assessed/performed ?Overall Cognitive Status: Within Functional Limits for tasks assessed ?  ?  ?  ?  ?  ?  ?  ?  ?  ?  ?  ?  ?  ?  ?  ?  ?  ?  ?  ?   ? ?  ?   ?  ?    ? ? ?Home Living Family/patient expects to be discharged to:: Private residence ?Living Arrangements: Spouse/significant other;Children ?Available Help at Discharge: Family;Available 24 hours/day ?Type of Home: House ?Home Access: Stairs to enter ?Entrance Stairs-Number of Steps: 3 ?Entrance Stairs-Rails: None ?Home Layout: One level ?  ?  ?Bathroom Shower/Tub: Walk-in shower ?  ?Bathroom Toilet: Standard ?Bathroom Accessibility: No ?  ?Home Equipment: Chartered certified accountant (2 wheels);Cane - single point;Cane - quad;Shower seat;Hand held shower head;Grab bars - tub/shower;Grab bars - toilet ?  ?Additional Comments: Pt reported no change in home living history since recent admission. ?  ? ?  ?Prior Functioning/Environment Prior Level of Function : Needs assist ?  ?  ?  ?Physical Assist : Mobility (physical);ADLs (physical) ?Mobility (physical): Transfers ?ADLs (physical): Bathing;Dressing;Toileting;IADLs ?Mobility Comments: uses cane, or RW ; more use of cane per report ?ADLs Comments: PRN assist for bathing, dressing, and toileting; assisted for IADL's. Indepndent for grooming and eating. ?  ? ?  ?  ?OT Problem List: Decreased strength;Decreased range of motion;Decreased activity tolerance;Impaired balance (sitting and/or standing);Decreased coordination ?  ?   ?OT Treatment/Interventions: Self-care/ADL training;Therapeutic exercise;Therapeutic activities;Patient/family education;Energy conservation;Balance training  ?  ?OT Goals(Current goals can be found in the care plan section) Acute Rehab OT Goals ?Patient Stated Goal: return home ?OT Goal Formulation: With patient ?Time For Goal Achievement: 03/28/22 ?Potential to Achieve Goals: Fair  ?OT Frequency: Min 2X/week ?  ? ?  Co-evaluation PT/OT/SLP Co-Evaluation/Treatment: Yes ?Reason for Co-Treatment: To address functional/ADL transfers ?  ?OT goals addressed during session: ADL's and self-care ?  ? ?  ?AM-PAC OT "6 Clicks" Daily Activity     ?Outcome Measure Help from another person eating meals?: A Little ?Help from another person taking care of personal grooming?: A Little ?Help from another person toileting, which includes using toliet, bedpan, or urinal?: A Lot ?Help from another person bathing (including washing, rinsing, drying)?: A Lot ?Help from another person to put on and taking off regular upper body clothing?: A Little ?Help from another person to put on and taking off regular lower body clothing?: A Lot ?6 Click  Score: 15 ?  ?End of Session Nurse Communication: Other (comment) (Nurse present during session.) ? ?Activity Tolerance: Other (comment) (Pt requesting to return to supine in bed.) ?Patient left: in bed;with ca

## 2022-03-15 DIAGNOSIS — N179 Acute kidney failure, unspecified: Secondary | ICD-10-CM | POA: Diagnosis not present

## 2022-03-15 LAB — BASIC METABOLIC PANEL
Anion gap: 9 (ref 5–15)
BUN: 12 mg/dL (ref 8–23)
CO2: 20 mmol/L — ABNORMAL LOW (ref 22–32)
Calcium: 8.4 mg/dL — ABNORMAL LOW (ref 8.9–10.3)
Chloride: 104 mmol/L (ref 98–111)
Creatinine, Ser: 1.12 mg/dL — ABNORMAL HIGH (ref 0.44–1.00)
GFR, Estimated: 52 mL/min — ABNORMAL LOW (ref >60)
Glucose, Bld: 89 mg/dL (ref 70–99)
Potassium: 3.5 mmol/L (ref 3.5–5.1)
Sodium: 133 mmol/L — ABNORMAL LOW (ref 135–145)

## 2022-03-15 LAB — CBC
HCT: 28.7 % — ABNORMAL LOW (ref 36.0–46.0)
Hemoglobin: 9.6 g/dL — ABNORMAL LOW (ref 12.0–15.0)
MCH: 30.9 pg (ref 26.0–34.0)
MCHC: 33.4 g/dL (ref 30.0–36.0)
MCV: 92.3 fL (ref 80.0–100.0)
Platelets: 258 10*3/uL (ref 150–400)
RBC: 3.11 MIL/uL — ABNORMAL LOW (ref 3.87–5.11)
RDW: 15 % (ref 11.5–15.5)
WBC: 7 10*3/uL (ref 4.0–10.5)
nRBC: 0.9 % — ABNORMAL HIGH (ref 0.0–0.2)

## 2022-03-15 LAB — T3, FREE: T3, Free: <0.3 pg/mL — ABNORMAL LOW (ref 2.0–4.4)

## 2022-03-15 MED ORDER — ADULT MULTIVITAMIN W/MINERALS CH: 1.0000 | ORAL_TABLET | Freq: Every day | ORAL | 0 refills | Status: AC

## 2022-03-15 MED ORDER — LEVOTHYROXINE SODIUM 75 MCG PO TABS
150.0000 ug | ORAL_TABLET | Freq: Every day | ORAL | Status: DC
  Administered 2022-03-16 – 2022-03-17 (×2): 150 ug via ORAL
  Filled 2022-03-15 (×2): qty 2

## 2022-03-15 MED ORDER — ENSURE ENLIVE PO LIQD: 237.0000 mL | Freq: Two times a day (BID) | ORAL | 12 refills | Status: DC

## 2022-03-15 MED ORDER — CEFDINIR 300 MG PO CAPS
300.0000 mg | ORAL_CAPSULE | Freq: Two times a day (BID) | ORAL | 0 refills | Status: AC
Start: 2022-03-15 — End: 2022-03-17

## 2022-03-15 MED ORDER — LEVOTHYROXINE SODIUM 150 MCG PO TABS
150.0000 ug | ORAL_TABLET | Freq: Every day | ORAL | 0 refills | Status: DC
Start: 2022-03-16 — End: 2022-04-05

## 2022-03-15 MED ORDER — MEGESTROL ACETATE 400 MG/10ML PO SUSP
400.0000 mg | Freq: Two times a day (BID) | ORAL | 0 refills | Status: DC
Start: 2022-03-15 — End: 2022-07-05

## 2022-03-15 NOTE — Care Management Important Message (Signed)
Important Message ? ?Patient Details  ?Name: [name] ?MRN: [id number] ?Date of Birth: 1946-09-15 ? ? ?Medicare Important Message Given:  Yes ? ? ? ? ?Tommy Medal ?03/15/2022, 11:51 AM ?

## 2022-03-15 NOTE — TOC Progression Note (Signed)
Transition of Care (TOC) - Progression Note  ? ? ?Patient Details  ?Name: [name] ?MRN: [id number] ?Date of Birth: 28-Oct-1946 ? ?Transition of Care (TOC) CM/SW Contact  ?Salome Arnt, LCSW ?Phone Number: ?03/15/2022, 3:39 PM ? ?Clinical Narrative:  Bed [facility] accepted at [facility]. LCSW confirmed authorization was started by SNF. Awaiting auth. Updated pt's son. D/C summary sent to SNF. Son is calling insurance to determine if they want EMS transport. If authorization is completed this afternoon, pt will d/c to SNF today. MD and RN updated.   ? ? ? ?Expected Discharge Plan: [place] ?Barriers to Discharge: Continued Medical Work up ? ?Expected Discharge Plan and Services ?Expected Discharge Plan: [place] ?In-house Referral: Clinical Social Work ?  ?Post Acute Care Choice: [place] ?Living arrangements for the past 2 months: [place] ?Expected Discharge Date: 03/15/22               ?  ?  ?  ?  ?  ?  ?  ?  ?  ?  ? ? ?Social Determinants of Health (SDOH) Interventions ?  ? ?Readmission Risk Interventions ?   ? View : No data to display.  ?  ?  ?  ? ? ?

## 2022-03-15 NOTE — Discharge Summary (Addendum)
Physician Discharge Summary  ?[name] ZOX:[id number] DOB: [birth year] DOA: 03/13/2022 ? ?PCP: Claretta Fraise, MD ? ?Admit date: 03/13/2022 ? ?Discharge date: 03/17/2022 ? ?Admitted From:Home ? ?Disposition:  SNF ? ?Recommendations for Outpatient Follow-up:  ?Follow up with PCP in 1-2 weeks ?Follow-up with endocrinology outpatient to address hypothyroidism.  Synthroid dose increased to 150 mcg daily. ?Continue on Megace for appetite stimulation ?Continue on Omnicef for 2 more days to complete treatment for UTI ?Continue other medications as noted previously ? ?Home Health: None ? ?Equipment/Devices: None ? ?Discharge Condition:Stable ? ?CODE STATUS: Full ? ?Diet recommendation: Heart Healthy ? ?Brief/Interim Summary: ?Per HPI: ?[name] is a 76 y.o. female with medical history significant of with history of acute renal failure superimposed on CKD, recovering alcoholism, B12 deficiency, hyperlipidemia, hypertension, hypothyroid, stroke, and more presents ED with a chief complaint of failure to thrive.   ?  ?Family story and patient's story very different.  ?Per Family weak, not walking, not eating, confused, weight loss 20 lbs .  Patient reports that she was very weak but she has been getting stronger.  She reports that she was eating 1 time a day but she has been eating 3 times a day lately.  She is alert and oriented during my exam.   ?  ?Family is concerned that they cannot take care of her at home when she is this week. ? Patient was recently discharged from the hospital on February 19.  At that time she was also brought in by her husband for altered mental status.  At that time he also complained that she was confused, but he had reported her to have diarrhea at that time as well.  Patient had significant hypokalemia, and was borderline febrile at presentation without admission.  She had colitis and cholelithiasis on CT. She was diagnosed with C. difficile colitis.  She has since finished a course of  vancomycin.  She reports no further diarrhea. ? ?-Patient was admitted with AKI and failure to thrive along with generalized weakness in the setting of poorly controlled hypothyroidism.  She has received IV fluid with improvement in her creatinine levels and has been started on Megace for appetite stimulation.  Levothyroxine dose has been increased as well and she will require endocrinology outpatient follow-up to ensure that these levels continue to improve.  She was noted to have UTI as well and has completed 2 days course of treatment of Rocephin and therefore will remain on Omnicef for 2 more days to ensure adequate duration of treatment.  No other acute events noted throughout the course of this admission and she is stable to discharge to SNF per PT recommendations. ? ?Discharge Diagnoses:  ?Principal Problem: ?  AKI (acute kidney injury) ([place]) ?Active Problems: ?  Essential hypertension ?  Hypokalemia ?  Hypothyroidism ?  UTI (urinary tract infection) ?  Hyponatremia ?  Generalized weakness ?  Failure to thrive in adult ? ?Principal discharge diagnosis: Failure to thrive with associated AKI.  Generalized weakness with poorly controlled hypothyroidism.  UTI. ? ?Discharge Instructions ? ?Discharge Instructions   ? ? Ambulatory referral to Endocrinology   Complete by: As directed ?  ? Diet - low sodium heart healthy   Complete by: As directed ?  ? Increase activity slowly   Complete by: As directed ?  ? No wound care   Complete by: As directed ?  ? ?  ? ?Allergies as of 03/17/2022   ? ?   Reactions  ? Ciprofloxacin Other (  See Comments)  ? Caused pt to have delusional thoughts and hallucinations  ? Procardia [nifedipine] Swelling  ? Edema   ? Norvasc [amlodipine Besylate] Hives  ? ?  ? ?  ?Medication List  ?  ? ?STOP taking these medications   ? ?amoxicillin-clavulanate 875-125 MG tablet ?Commonly known as: Augmentin ?  ? ?  ? ?TAKE these medications   ? ?cefdinir 300 MG capsule ?Commonly known as: OMNICEF ?Take 1  capsule (300 mg total) by mouth 2 (two) times daily for 2 days. ?  ?feeding supplement Liqd ?Take 237 mLs by mouth 2 (two) times daily between meals. ?  ?levothyroxine 150 MCG tablet ?Commonly known as: SYNTHROID ?Take 1 tablet (150 mcg total) by mouth daily at 6 (six) AM. ?What changed:  ?medication strength ?how much to take ?when to take this ?  ?megestrol 400 MG/10ML suspension ?Commonly known as: MEGACE ?Take 10 mLs (400 mg total) by mouth 2 (two) times daily. ?  ?metoprolol succinate 50 MG 24 hr tablet ?Commonly known as: TOPROL-XL ?Take 1 tablet (50 mg total) by mouth daily. Take with or immediately following a meal. ?  ?multivitamin with minerals Tabs tablet ?Take 1 tablet by mouth daily. ?  ?potassium chloride SA 20 MEQ tablet ?Commonly known as: KLOR-CON M ?Take one tablet bid x 3 days, then take one tablet once a day ?  ?saccharomyces boulardii 250 MG capsule ?Commonly known as: FLORASTOR ?Take 1 capsule (250 mg total) by mouth 2 (two) times daily. ?  ? ?  ? ? ? Contact information for follow-up providers   ? ? Claretta Fraise, MD. Schedule an appointment as soon as possible for a visit in 1 week(s).   ?Specialty: Family Medicine ?Contact information: ?[address] ?[place] [address] ?[phone] ? ? ?  ?  ? ? [facility]. Go in 1 week(s).   ?Specialty: Endocrinology ?Contact information: ?[address] ?[place] ?[phone] ? ?  ?  ? ?  ?  ? ? Contact information for after-discharge care   ? ? Destination   ? ? [facility] .   ?Service: Skilled Nursing ?Contact information: ?[place] ?[phone] ? ?  ?  ? ?  ?  ? ?  ?  ? ?  ? ?Allergies  ?Allergen Reactions  ? Ciprofloxacin Other (See Comments)  ?  Caused pt to have delusional thoughts and hallucinations  ? Procardia [Nifedipine] Swelling  ?  Edema  ?  ? Norvasc [Amlodipine Besylate] Hives   ? ? ?Consultations: ?None ? ? ?Procedures/Studies: ?CT Head Wo Contrast ? ?Result Date: 03/03/2022 ?CLINICAL DATA:  Intermittent back pain. EXAM: CT HEAD WITHOUT CONTRAST TECHNIQUE: Contiguous axial images were obtained from the base of the skull through the vertex without intravenous contrast. RADIATION DOSE REDUCTION: This exam was performed according to the departmental dose-optimization program which includes automated exposure control, adjustment of the mA and/or kV according to patient size and/or use of iterative reconstruction technique. COMPARISON:  January 27, 2022 FINDINGS: Brain: There is moderate severity cerebral atrophy with widening of the extra-axial spaces and ventricular dilatation. There are areas of decreased attenuation within the white matter tracts of the supratentorial brain, consistent with microvascular disease changes. Vascular: No hyperdense vessel or unexpected calcification. Skull: Normal. Negative for fracture or focal lesion. Sinuses/Orbits: No acute finding. Other: None. IMPRESSION: 1. No acute intracranial abnormality. 2. 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  Electronically Signed   By: Virgina Norfolk M.D.   On: 03/03/2022 23:44  ? ?US THYROID ? ?Result Date: 03/14/2022 ?CLINICAL DATA:  Hypothyroid. On thyroid hormone replacement therapy. EXAM: THYROID ULTRASOUND TECHNIQUE: Ultrasound examination of the thyroid gland and adjacent soft tissues was performed. COMPARISON:  None. FINDINGS: Parenchymal Echotexture: Markedly heterogenous Isthmus: 0.1 cm Right lobe: 4.1 x 1.8 x 0.9 cm Left lobe: 4.3 x 1.0 x 1.0 cm _________________________________________________________ Estimated total number of nodules >/= 1 cm: 0 Number of spongiform nodules >/=  2 cm not described below (TR1): 0 Number of mixed cystic and solid nodules >/= 1.5 cm not described below (TR2): 0 _________________________________________________________ No  discrete nodules are seen within the thyroid gland. IMPRESSION: Small heterogeneous thyroid gland. Imaging appearance is consistent with chronic thyroid hormone replacement therapy in the setting of hypothyroidism. No thyroid nodules are visualized. Electronically S

## 2022-03-15 NOTE — Progress Notes (Signed)
Patients spouse has came to nurses station several times during shift regarding patient being discharged. Spouse informed that we are waiting on authorization for patient to go to [facility], once approved patient will be transported to facility.  ?

## 2022-03-15 NOTE — Progress Notes (Signed)
Patients Blood pressure 110/74, Pulse 71. Per MD Manuella Ghazi hold Metoprolol 50 mg.  ? ? ?

## 2022-03-16 DIAGNOSIS — N179 Acute kidney failure, unspecified: Secondary | ICD-10-CM | POA: Diagnosis not present

## 2022-03-16 LAB — BASIC METABOLIC PANEL
Anion gap: 11 (ref 5–15)
BUN: 11 mg/dL (ref 8–23)
CO2: 17 mmol/L — ABNORMAL LOW (ref 22–32)
Calcium: 8.8 mg/dL — ABNORMAL LOW (ref 8.9–10.3)
Chloride: 104 mmol/L (ref 98–111)
Creatinine, Ser: 1.19 mg/dL — ABNORMAL HIGH (ref 0.44–1.00)
GFR, Estimated: 48 mL/min — ABNORMAL LOW (ref >60)
Glucose, Bld: 78 mg/dL (ref 70–99)
Potassium: 3.1 mmol/L — ABNORMAL LOW (ref 3.5–5.1)
Sodium: 132 mmol/L — ABNORMAL LOW (ref 135–145)

## 2022-03-16 LAB — CBC
HCT: 26.9 % — ABNORMAL LOW (ref 36.0–46.0)
Hemoglobin: 9.2 g/dL — ABNORMAL LOW (ref 12.0–15.0)
MCH: 31.5 pg (ref 26.0–34.0)
MCHC: 34.2 g/dL (ref 30.0–36.0)
MCV: 92.1 fL (ref 80.0–100.0)
Platelets: 272 10*3/uL (ref 150–400)
RBC: 2.92 MIL/uL — ABNORMAL LOW (ref 3.87–5.11)
RDW: 15.3 % (ref 11.5–15.5)
WBC: 8.2 10*3/uL (ref 4.0–10.5)
nRBC: 0.6 % — ABNORMAL HIGH (ref 0.0–0.2)

## 2022-03-16 MED ORDER — POTASSIUM CHLORIDE CRYS ER 20 MEQ PO TBCR
40.0000 meq | EXTENDED_RELEASE_TABLET | Freq: Once | ORAL | Status: AC
  Administered 2022-03-16: 40 meq via ORAL
  Filled 2022-03-16: qty 2

## 2022-03-16 NOTE — Progress Notes (Signed)
Patient seen and evaluated at bedside this a.m. and husband also present.  No acute overnight events noted.  She is awaiting transfer to SNF this AM.  Please refer to discharge summary dictated 4/5 for full details.  She will receive some potassium supplementation this AM. ? ?Total care time: 15 minutes. ?

## 2022-03-17 DIAGNOSIS — E559 Vitamin D deficiency, unspecified: Secondary | ICD-10-CM | POA: Diagnosis not present

## 2022-03-17 DIAGNOSIS — D518 Other vitamin B12 deficiency anemias: Secondary | ICD-10-CM | POA: Diagnosis not present

## 2022-03-17 DIAGNOSIS — M25532 Pain in left wrist: Secondary | ICD-10-CM | POA: Diagnosis not present

## 2022-03-17 DIAGNOSIS — D649 Anemia, unspecified: Secondary | ICD-10-CM | POA: Diagnosis not present

## 2022-03-17 DIAGNOSIS — I10 Essential (primary) hypertension: Secondary | ICD-10-CM | POA: Diagnosis not present

## 2022-03-17 DIAGNOSIS — I959 Hypotension, unspecified: Secondary | ICD-10-CM | POA: Diagnosis not present

## 2022-03-17 DIAGNOSIS — E119 Type 2 diabetes mellitus without complications: Secondary | ICD-10-CM | POA: Diagnosis not present

## 2022-03-17 DIAGNOSIS — N390 Urinary tract infection, site not specified: Secondary | ICD-10-CM | POA: Diagnosis not present

## 2022-03-17 DIAGNOSIS — Z79899 Other long term (current) drug therapy: Secondary | ICD-10-CM | POA: Diagnosis not present

## 2022-03-17 DIAGNOSIS — M1000 Idiopathic gout, unspecified site: Secondary | ICD-10-CM | POA: Diagnosis not present

## 2022-03-17 DIAGNOSIS — M6281 Muscle weakness (generalized): Secondary | ICD-10-CM | POA: Diagnosis not present

## 2022-03-17 DIAGNOSIS — Z7401 Bed confinement status: Secondary | ICD-10-CM | POA: Diagnosis not present

## 2022-03-17 DIAGNOSIS — M25432 Effusion, left wrist: Secondary | ICD-10-CM | POA: Diagnosis not present

## 2022-03-17 DIAGNOSIS — Z8673 Personal history of transient ischemic attack (TIA), and cerebral infarction without residual deficits: Secondary | ICD-10-CM | POA: Diagnosis not present

## 2022-03-17 DIAGNOSIS — M79642 Pain in left hand: Secondary | ICD-10-CM | POA: Diagnosis not present

## 2022-03-17 DIAGNOSIS — R52 Pain, unspecified: Secondary | ICD-10-CM | POA: Diagnosis not present

## 2022-03-17 DIAGNOSIS — R7989 Other specified abnormal findings of blood chemistry: Secondary | ICD-10-CM | POA: Diagnosis not present

## 2022-03-17 DIAGNOSIS — E7800 Pure hypercholesterolemia, unspecified: Secondary | ICD-10-CM | POA: Diagnosis not present

## 2022-03-17 DIAGNOSIS — R627 Adult failure to thrive: Secondary | ICD-10-CM | POA: Diagnosis not present

## 2022-03-17 DIAGNOSIS — M79622 Pain in left upper arm: Secondary | ICD-10-CM | POA: Diagnosis not present

## 2022-03-17 DIAGNOSIS — E785 Hyperlipidemia, unspecified: Secondary | ICD-10-CM | POA: Diagnosis not present

## 2022-03-17 DIAGNOSIS — N179 Acute kidney failure, unspecified: Secondary | ICD-10-CM | POA: Diagnosis not present

## 2022-03-17 DIAGNOSIS — E039 Hypothyroidism, unspecified: Secondary | ICD-10-CM | POA: Diagnosis not present

## 2022-03-17 DIAGNOSIS — N189 Chronic kidney disease, unspecified: Secondary | ICD-10-CM | POA: Diagnosis not present

## 2022-03-17 DIAGNOSIS — F1021 Alcohol dependence, in remission: Secondary | ICD-10-CM | POA: Diagnosis not present

## 2022-03-17 DIAGNOSIS — R2232 Localized swelling, mass and lump, left upper limb: Secondary | ICD-10-CM | POA: Diagnosis not present

## 2022-03-17 LAB — CBC
HCT: 24.8 % — ABNORMAL LOW (ref 36.0–46.0)
Hemoglobin: 8.3 g/dL — ABNORMAL LOW (ref 12.0–15.0)
MCH: 31.2 pg (ref 26.0–34.0)
MCHC: 33.5 g/dL (ref 30.0–36.0)
MCV: 93.2 fL (ref 80.0–100.0)
Platelets: 262 10*3/uL (ref 150–400)
RBC: 2.66 MIL/uL — ABNORMAL LOW (ref 3.87–5.11)
RDW: 15.9 % — ABNORMAL HIGH (ref 11.5–15.5)
WBC: 6.2 10*3/uL (ref 4.0–10.5)
nRBC: 0.3 % — ABNORMAL HIGH (ref 0.0–0.2)

## 2022-03-17 LAB — BASIC METABOLIC PANEL
Anion gap: 8 (ref 5–15)
BUN: 11 mg/dL (ref 8–23)
CO2: 18 mmol/L — ABNORMAL LOW (ref 22–32)
Calcium: 8.5 mg/dL — ABNORMAL LOW (ref 8.9–10.3)
Chloride: 106 mmol/L (ref 98–111)
Creatinine, Ser: 1.4 mg/dL — ABNORMAL HIGH (ref 0.44–1.00)
GFR, Estimated: 39 mL/min — ABNORMAL LOW (ref >60)
Glucose, Bld: 87 mg/dL (ref 70–99)
Potassium: 3.4 mmol/L — ABNORMAL LOW (ref 3.5–5.1)
Sodium: 132 mmol/L — ABNORMAL LOW (ref 135–145)

## 2022-03-17 LAB — CARBAPENEM RESISTANCE PANEL
Carba Resistance IMP Gene: NOT DETECTED
Carba Resistance KPC Gene: NOT DETECTED
Carba Resistance NDM Gene: NOT DETECTED
Carba Resistance OXA48 Gene: NOT DETECTED
Carba Resistance VIM Gene: NOT DETECTED

## 2022-03-17 LAB — URINE CULTURE: Culture: 50000 — AB

## 2022-03-17 LAB — VITAMIN B1: Vitamin B1 (Thiamine): <20 nmol/L — ABNORMAL LOW (ref 66.5–200.0)

## 2022-03-17 MED ORDER — POTASSIUM CHLORIDE CRYS ER 20 MEQ PO TBCR
40.0000 meq | EXTENDED_RELEASE_TABLET | Freq: Once | ORAL | Status: AC
  Administered 2022-03-17: 40 meq via ORAL
  Filled 2022-03-17: qty 2

## 2022-03-17 MED ORDER — FOSFOMYCIN TROMETHAMINE 3 G PO PACK
3.0000 g | PACK | Freq: Once | ORAL | Status: AC
Start: 2022-03-17 — End: 2022-03-17
  Administered 2022-03-17: 3 g via ORAL
  Filled 2022-03-17: qty 3

## 2022-03-17 NOTE — Progress Notes (Signed)
Attempted to call report to [place] ?

## 2022-03-17 NOTE — TOC Transition Note (Signed)
Transition of Care (TOC) - CM/SW Discharge Note ? ? ?Patient Details  ?Name: [name] ?MRN: [id number] ?Date of Birth: 11/20/1946 ? ?Transition of Care (TOC) CM/SW Contact:  ?Boneta Lucks, RN ?Phone Number: ?03/17/2022, 10:31 AM ? ? ?Clinical Narrative:   Patient medically ready for discharge and INS Auth received today. Melissa with [facility] provided number for report and room number. RN to call report, med necessity printed. TOC will call EMS when RN is ready. DC summary sent in the hub.  ? ? ?Final next level of care: [place] ?Barriers to Discharge: Barriers Resolved ? ?Patient Goals and CMS Choice ?Patient states their goals for this hospitalization and ongoing recovery are:: to go to SNF ?CMS Medicare.gov Compare Post Acute Care list provided to:: Patient ?Choice offered to / list presented to : Patient ? ?Discharge Placement ?            ?  ?Patient to be transferred to facility by: EMS ?  ?Patient and family notified of of transfer: 03/17/22 ? ?Discharge Plan and Services ?In-house Referral: Clinical Social Work ?  ?Post Acute Care Choice: [facility]          ?  ?   ?Readmission Risk Interventions ? ?  03/17/2022  ? 10:31 AM  ?Readmission Risk Prevention Plan  ?Transportation Screening Complete  ?PCP or Specialist Appt within 5-7 Days Complete  ?Home Care Screening Complete  ?Medication Review (RN CM) Complete  ? ? ? ?

## 2022-03-17 NOTE — Progress Notes (Signed)
Report given to [employer].  ?

## 2022-03-17 NOTE — Progress Notes (Addendum)
Patient seen and evaluated at bedside this a.m. with no new complaints or concerns noted.  She is noted to again have mild hypokalemia requiring some repletion.  She will be continued on oral supplementation on discharge.  She was also noted to have urine cultures growing E. coli that was resistant to Rocephin for which she was initially treated.  She will be given 1 dose of fosfomycin today prior to discharge.  She has received authorization from her SNF and is stable for discharge today.  No other acute events noted overnight.  Please refer to discharge summary dictated 4/5 for full details. ? ?Total care time: 15 minutes. ?

## 2022-03-20 DIAGNOSIS — E039 Hypothyroidism, unspecified: Secondary | ICD-10-CM | POA: Diagnosis not present

## 2022-03-20 DIAGNOSIS — R627 Adult failure to thrive: Secondary | ICD-10-CM | POA: Diagnosis not present

## 2022-03-20 DIAGNOSIS — N179 Acute kidney failure, unspecified: Secondary | ICD-10-CM | POA: Diagnosis not present

## 2022-03-20 DIAGNOSIS — N189 Chronic kidney disease, unspecified: Secondary | ICD-10-CM | POA: Diagnosis not present

## 2022-03-21 DIAGNOSIS — R627 Adult failure to thrive: Secondary | ICD-10-CM | POA: Diagnosis not present

## 2022-03-21 DIAGNOSIS — E559 Vitamin D deficiency, unspecified: Secondary | ICD-10-CM | POA: Diagnosis not present

## 2022-03-21 DIAGNOSIS — E785 Hyperlipidemia, unspecified: Secondary | ICD-10-CM | POA: Diagnosis not present

## 2022-03-21 DIAGNOSIS — E119 Type 2 diabetes mellitus without complications: Secondary | ICD-10-CM | POA: Diagnosis not present

## 2022-03-21 DIAGNOSIS — N179 Acute kidney failure, unspecified: Secondary | ICD-10-CM | POA: Diagnosis not present

## 2022-03-21 DIAGNOSIS — E7800 Pure hypercholesterolemia, unspecified: Secondary | ICD-10-CM | POA: Diagnosis not present

## 2022-03-21 DIAGNOSIS — D518 Other vitamin B12 deficiency anemias: Secondary | ICD-10-CM | POA: Diagnosis not present

## 2022-03-21 DIAGNOSIS — N189 Chronic kidney disease, unspecified: Secondary | ICD-10-CM | POA: Diagnosis not present

## 2022-03-21 DIAGNOSIS — D649 Anemia, unspecified: Secondary | ICD-10-CM | POA: Diagnosis not present

## 2022-03-21 DIAGNOSIS — E039 Hypothyroidism, unspecified: Secondary | ICD-10-CM | POA: Diagnosis not present

## 2022-03-22 ENCOUNTER — Encounter: Payer: Self-pay | Admitting: Family Medicine

## 2022-03-22 ENCOUNTER — Ambulatory Visit: Payer: Medicare HMO | Admitting: Family Medicine

## 2022-03-22 DIAGNOSIS — R627 Adult failure to thrive: Secondary | ICD-10-CM | POA: Diagnosis not present

## 2022-03-22 DIAGNOSIS — E039 Hypothyroidism, unspecified: Secondary | ICD-10-CM | POA: Diagnosis not present

## 2022-03-22 DIAGNOSIS — N189 Chronic kidney disease, unspecified: Secondary | ICD-10-CM | POA: Diagnosis not present

## 2022-03-22 DIAGNOSIS — N179 Acute kidney failure, unspecified: Secondary | ICD-10-CM | POA: Diagnosis not present

## 2022-03-23 DIAGNOSIS — Z8673 Personal history of transient ischemic attack (TIA), and cerebral infarction without residual deficits: Secondary | ICD-10-CM | POA: Diagnosis not present

## 2022-03-23 DIAGNOSIS — R627 Adult failure to thrive: Secondary | ICD-10-CM | POA: Diagnosis not present

## 2022-03-23 DIAGNOSIS — R7989 Other specified abnormal findings of blood chemistry: Secondary | ICD-10-CM | POA: Diagnosis not present

## 2022-03-23 DIAGNOSIS — I10 Essential (primary) hypertension: Secondary | ICD-10-CM | POA: Diagnosis not present

## 2022-03-27 DIAGNOSIS — Z79899 Other long term (current) drug therapy: Secondary | ICD-10-CM | POA: Diagnosis not present

## 2022-03-27 DIAGNOSIS — F1021 Alcohol dependence, in remission: Secondary | ICD-10-CM | POA: Diagnosis not present

## 2022-03-28 DIAGNOSIS — R52 Pain, unspecified: Secondary | ICD-10-CM | POA: Diagnosis not present

## 2022-03-28 DIAGNOSIS — Z79899 Other long term (current) drug therapy: Secondary | ICD-10-CM | POA: Diagnosis not present

## 2022-03-29 DIAGNOSIS — N189 Chronic kidney disease, unspecified: Secondary | ICD-10-CM | POA: Diagnosis not present

## 2022-03-29 DIAGNOSIS — D649 Anemia, unspecified: Secondary | ICD-10-CM | POA: Diagnosis not present

## 2022-03-29 DIAGNOSIS — E039 Hypothyroidism, unspecified: Secondary | ICD-10-CM | POA: Diagnosis not present

## 2022-03-29 LAB — TSH: TSH: 100 — AB (ref 0.41–5.90)

## 2022-03-30 DIAGNOSIS — E785 Hyperlipidemia, unspecified: Secondary | ICD-10-CM | POA: Diagnosis not present

## 2022-03-30 DIAGNOSIS — Z79899 Other long term (current) drug therapy: Secondary | ICD-10-CM | POA: Diagnosis not present

## 2022-04-05 ENCOUNTER — Ambulatory Visit (INDEPENDENT_AMBULATORY_CARE_PROVIDER_SITE_OTHER): Payer: Medicare HMO | Admitting: Nurse Practitioner

## 2022-04-05 ENCOUNTER — Encounter: Payer: Self-pay | Admitting: Nurse Practitioner

## 2022-04-05 VITALS — BP 112/67 | HR 79

## 2022-04-05 DIAGNOSIS — E039 Hypothyroidism, unspecified: Secondary | ICD-10-CM | POA: Diagnosis not present

## 2022-04-05 MED ORDER — LEVOTHYROXINE SODIUM 100 MCG PO TABS
100.0000 ug | ORAL_TABLET | Freq: Every day | ORAL | 1 refills | Status: DC
Start: 2022-04-05 — End: 2022-07-05

## 2022-04-05 NOTE — Patient Instructions (Signed)

## 2022-04-05 NOTE — Progress Notes (Signed)
?                                   ?                                Endocrinology Consult Note  ?                                       04/05/2022, 11:18 AM ? ?Subjective:  ? ?Subjective   ? ?[name] is a 76 y.o.-year-old female patient being seen in consultation for hypothyroidism referred by Claretta Fraise, MD. ? ? ?Past Medical History:  ?Diagnosis Date  ? Acute pyelonephritis 10/15/2019  ? Acute renal failure superimposed on stage 2 chronic kidney disease ([place]) 10/15/2019  ? Alcoholism ([place]) 01/09/2012  ? Anxiety   ? Arthritis   ? Left knee  ? B12 deficiency   ? Carotid artery narrowing 10/03/2012  ? On the right.  ? Chronic kidney disease (CKD), stage II (mild)   ? Depression   ? Headache(784.0)   ? Hyperlipidemia   ? Hypertension   ? Hyponatremia 01/09/2012  ? Hypothyroid   ? Incontinence of urine in female   ? Menopause   ? Mini stroke   ? Orthostatic syncope 10/02/2012  ? SBO (small bowel obstruction) ([place]) 06/28/2016  ? Stroke [facility]) 2010  ? no deficits  ? Thrombocytopenia ([place]) 10/15/2019  ? ? ?Past Surgical History:  ?Procedure Laterality Date  ? AGILE CAPSULE N/A 02/22/2016  ? dummy capsule remained in the distal ileum  ? APPENDECTOMY    ? CATARACT EXTRACTION W/PHACO  06/20/2012  ? Procedure: CATARACT EXTRACTION PHACO AND INTRAOCULAR LENS PLACEMENT (IOC);  Surgeon: Tonny Branch, MD;  Location: [facility];  Service: Ophthalmology;  Laterality: Right;  CDE:10.66  ? CATARACT EXTRACTION W/PHACO  07/11/2012  ? Procedure: CATARACT EXTRACTION PHACO AND INTRAOCULAR LENS PLACEMENT (IOC);  Surgeon: Tonny Branch, MD;  Location: [facility];  Service: Ophthalmology;  Laterality: Left;  CDE: 12.69  ? COLONOSCOPY N/A 12/17/2015  ? RMR: Marked circumferential ulceration of the ascending colon/Ileocecal valve ulceration with biopsy most consistent with NSAID injury versus inflammatory bowel disease, pancolonoc diverticulosis redundant colon  ? ESOPHAGOGASTRODUODENOSCOPY N/A 12/17/2015  ? [id number] gastric polyp, biopsy benign  ?  KNEE ARTHROSCOPY Right 09/2016  ? LUMBAR LAMINECTOMY/DECOMPRESSION MICRODISCECTOMY N/A 05/25/2017  ? Procedure: CENTRAL DECOMPRESSIVE LUMBAR LAMINECTOMY FOR SPINAL STENOSIS L3-L4, FORAMINOTOMY FOR THE L4 ROOT AND L5 ROOT;  Surgeon: Latanya Maudlin, MD;  Location: [facility];  Service: Orthopedics;  Laterality: N/A;  ? TONSILLECTOMY    ? TOTAL KNEE ARTHROPLASTY Left 10/31/2021  ? Procedure: TOTAL KNEE ARTHROPLASTY;  Surgeon: Gaynelle Arabian, MD;  Location: [facility];  Service: Orthopedics;  Laterality: Left;  ? ? ?Social History  ? ?Socioeconomic History  ? Marital status: Married  ?  Spouse name: Maricela Curet"  ? Number of children: 2  ? Years of education: 88  ? Highest education level: Some college, no degree  ?Occupational History  ? Occupation: Nature conservation officer rep  ?  Employer: [employer]  ?  Comment: Retired  ?Tobacco Use  ? Smoking status: Former  ?  Packs/day: 2.00  ?  Years: 30.00  ?  Pack years: 60.00  ?  Types: Cigarettes  ?  Quit date:  06/17/1992  ?  Years since quitting: 29.8  ? Smokeless tobacco: Never  ?Vaping Use  ? Vaping Use: Never used  ?Substance and Sexual Activity  ? Alcohol use: No  ? Drug use: No  ? Sexual activity: Not Currently  ?Other Topics Concern  ? Not on file  ?Social History Narrative  ? Lives on one level with her husband, Redmond Pulling  ? Son lives 3 streets over  ? ?Social Determinants of Health  ? ?Financial Resource Strain: Low Risk   ? Difficulty of Paying Living Expenses: Not hard at all  ?Food Insecurity: No Food Insecurity  ? Worried About Charity fundraiser in the Last Year: Never true  ? Ran Out of Food in the Last Year: Never true  ?Transportation Needs: No Transportation Needs  ? Lack of Transportation (Medical): No  ? Lack of Transportation (Non-Medical): No  ?Physical Activity: Insufficiently Active  ? Days of Exercise per Week: 7 days  ? Minutes of Exercise per Session: 10 min  ?Stress: No Stress Concern Present  ? Feeling of Stress : Not at all  ?Social Connections: Moderately  Isolated  ? Frequency of Communication with Friends and Family: More than three times a week  ? Frequency of Social Gatherings with Friends and Family: More than three times a week  ? Attends Religious Services: Never  ? Active Member of Clubs or Organizations: No  ? Attends Archivist Meetings: Never  ? Marital Status: Married  ? ? ?Family History  ?Problem Relation Age of Onset  ? GER disease Mother   ? Ulcers Mother   ? Hypertension Maternal Grandmother   ? ? ?Outpatient Encounter Medications as of 04/05/2022  ?Medication Sig  ? acetaminophen (TYLENOL) 500 MG tablet Take 1,000 mg by mouth every 12 (twelve) hours as needed.  ? atorvastatin (LIPITOR) 20 MG tablet Take 20 mg by mouth daily.  ? feeding supplement (ENSURE ENLIVE / ENSURE PLUS) LIQD Take 237 mLs by mouth 2 (two) times daily between meals.  ? ferrous sulfate 325 (65 FE) MG tablet Take 325 mg by mouth daily with breakfast.  ? folic acid (FOLVITE) 1 MG tablet Take 1 mg by mouth daily.  ? levothyroxine (SYNTHROID) 100 MCG tablet Take 1 tablet (100 mcg total) by mouth daily.  ? megestrol (MEGACE) 400 MG/10ML suspension Take 10 mLs (400 mg total) by mouth 2 (two) times daily.  ? metoprolol succinate (TOPROL-XL) 50 MG 24 hr tablet Take 1 tablet (50 mg total) by mouth daily. Take with or immediately following a meal.  ? Multiple Vitamin (MULTIVITAMIN WITH MINERALS) TABS tablet Take 1 tablet by mouth daily.  ? saccharomyces boulardii (FLORASTOR) 250 MG capsule Take 1 capsule (250 mg total) by mouth 2 (two) times daily.  ? thiamine 100 MG tablet Take 100 mg by mouth daily.  ? vitamin C (ASCORBIC ACID) 500 MG tablet Take 500 mg by mouth daily.  ? [DISCONTINUED] levothyroxine (SYNTHROID) 200 MCG tablet Take 200 mcg by mouth daily before breakfast.  ? potassium chloride SA (KLOR-CON M) 20 MEQ tablet Take one tablet bid x 3 days, then take one tablet once a day (Patient not taking: Reported on 04/05/2022)  ? [DISCONTINUED] levothyroxine (SYNTHROID) 150 MCG  tablet Take 1 tablet (150 mcg total) by mouth daily at 6 (six) AM. (Patient not taking: Reported on 04/05/2022)  ? ?No facility-administered encounter medications on file as of 04/05/2022.  ? ? ?ALLERGIES: ?Allergies  ?Allergen Reactions  ? Ciprofloxacin Other (See Comments)  ?  Caused pt to have delusional thoughts and hallucinations  ? Procardia [Nifedipine] Swelling  ?  Edema  ?  ? Ativan [Lorazepam]   ? Norvasc [Amlodipine Besylate] Hives  ? ?VACCINATION STATUS: ?Immunization History  ?Administered Date(s) Administered  ? Fluad Quad(high Dose 65+) 09/29/2019, 09/07/2020  ? Influenza, High Dose Seasonal PF 09/23/2014, 09/11/2016, 09/05/2017, 09/16/2018  ? Influenza,inj,Quad PF,6+ Mos 10/01/2013, 09/10/2015  ? Moderna SARS-COV2 Booster Vaccination 11/24/2020, 09/07/2021  ? Moderna Sars-Covid-2 Vaccination 02/19/2020, 03/24/2020  ? Pneumococcal Conjugate-13 05/12/2016  ? Pneumococcal Polysaccharide-23 01/10/2012, 04/27/2015  ? Tdap 12/14/2015  ? Zoster Recombinat (Shingrix) 11/28/2019, 04/22/2020  ? ? ? ?HPI  ? ?[name]  is a patient with the above medical history. She resides in a skilled nursing facility and is accompanied by a care attendant from the nursing home.  she was diagnosed with hypothyroidism a long time ago (unable to give me approximate age of diagnosis) which required subsequent initiation of thyroid hormone replacement. she was given various doses of Levothyroxine over the years, currently on 200 micrograms (a significantly high dose given her body weight). she reports compliance to this medication:  Taking it daily on empty stomach  with water.  The nursing home helps her with this, but after inspecting the [facility], she has been taking iron supplement and MVI shortly after taking her thyroid medication. ? ?She was recently hospitalized for renal failure and failure to thrive and later sent to nursing home for rehab.  She also had cdiff colitis.  She is on Megace for appetite stimulant but is  still having significant diarrhea. ? ?I reviewed patient's thyroid tests: ? ?Lab Results  ?Component Value Date  ? TSH 100.00 (A) 03/29/2022  ? TSH 174.548 (H) 03/14/2022  ? TSH 179.378 (H) 03/14/2022  ? T

## 2022-04-17 DIAGNOSIS — M25532 Pain in left wrist: Secondary | ICD-10-CM | POA: Diagnosis not present

## 2022-04-17 DIAGNOSIS — M25432 Effusion, left wrist: Secondary | ICD-10-CM | POA: Diagnosis not present

## 2022-04-17 DIAGNOSIS — Z79899 Other long term (current) drug therapy: Secondary | ICD-10-CM | POA: Diagnosis not present

## 2022-04-17 DIAGNOSIS — E785 Hyperlipidemia, unspecified: Secondary | ICD-10-CM | POA: Diagnosis not present

## 2022-04-21 DIAGNOSIS — M25532 Pain in left wrist: Secondary | ICD-10-CM | POA: Diagnosis not present

## 2022-04-21 DIAGNOSIS — Z79899 Other long term (current) drug therapy: Secondary | ICD-10-CM | POA: Diagnosis not present

## 2022-04-21 DIAGNOSIS — M25432 Effusion, left wrist: Secondary | ICD-10-CM | POA: Diagnosis not present

## 2022-04-26 DIAGNOSIS — E785 Hyperlipidemia, unspecified: Secondary | ICD-10-CM | POA: Diagnosis not present

## 2022-04-26 DIAGNOSIS — Z8673 Personal history of transient ischemic attack (TIA), and cerebral infarction without residual deficits: Secondary | ICD-10-CM | POA: Diagnosis not present

## 2022-04-26 DIAGNOSIS — I10 Essential (primary) hypertension: Secondary | ICD-10-CM | POA: Diagnosis not present

## 2022-04-26 DIAGNOSIS — E039 Hypothyroidism, unspecified: Secondary | ICD-10-CM | POA: Diagnosis not present

## 2022-04-27 DIAGNOSIS — M25432 Effusion, left wrist: Secondary | ICD-10-CM | POA: Diagnosis not present

## 2022-04-27 DIAGNOSIS — Z79899 Other long term (current) drug therapy: Secondary | ICD-10-CM | POA: Diagnosis not present

## 2022-05-08 DIAGNOSIS — M79632 Pain in left forearm: Secondary | ICD-10-CM | POA: Diagnosis not present

## 2022-05-08 DIAGNOSIS — M19032 Primary osteoarthritis, left wrist: Secondary | ICD-10-CM | POA: Diagnosis not present

## 2022-05-08 DIAGNOSIS — E039 Hypothyroidism, unspecified: Secondary | ICD-10-CM | POA: Diagnosis not present

## 2022-05-08 DIAGNOSIS — S5012XA Contusion of left forearm, initial encounter: Secondary | ICD-10-CM | POA: Diagnosis not present

## 2022-05-08 DIAGNOSIS — R233 Spontaneous ecchymoses: Secondary | ICD-10-CM | POA: Diagnosis not present

## 2022-05-08 DIAGNOSIS — E538 Deficiency of other specified B group vitamins: Secondary | ICD-10-CM | POA: Diagnosis not present

## 2022-05-08 DIAGNOSIS — M25432 Effusion, left wrist: Secondary | ICD-10-CM | POA: Diagnosis not present

## 2022-05-08 DIAGNOSIS — M79642 Pain in left hand: Secondary | ICD-10-CM | POA: Diagnosis not present

## 2022-05-08 DIAGNOSIS — S60222A Contusion of left hand, initial encounter: Secondary | ICD-10-CM | POA: Diagnosis not present

## 2022-05-11 DIAGNOSIS — M25432 Effusion, left wrist: Secondary | ICD-10-CM | POA: Diagnosis not present

## 2022-05-11 DIAGNOSIS — Z79899 Other long term (current) drug therapy: Secondary | ICD-10-CM | POA: Diagnosis not present

## 2022-05-11 DIAGNOSIS — R233 Spontaneous ecchymoses: Secondary | ICD-10-CM | POA: Diagnosis not present

## 2022-05-23 DIAGNOSIS — E039 Hypothyroidism, unspecified: Secondary | ICD-10-CM | POA: Diagnosis not present

## 2022-05-23 LAB — TSH: TSH: 122 — AB (ref 0.41–5.90)

## 2022-05-31 DIAGNOSIS — Z8673 Personal history of transient ischemic attack (TIA), and cerebral infarction without residual deficits: Secondary | ICD-10-CM | POA: Diagnosis not present

## 2022-05-31 DIAGNOSIS — I10 Essential (primary) hypertension: Secondary | ICD-10-CM | POA: Diagnosis not present

## 2022-05-31 DIAGNOSIS — E039 Hypothyroidism, unspecified: Secondary | ICD-10-CM | POA: Diagnosis not present

## 2022-05-31 DIAGNOSIS — E785 Hyperlipidemia, unspecified: Secondary | ICD-10-CM | POA: Diagnosis not present

## 2022-06-01 DIAGNOSIS — Z79899 Other long term (current) drug therapy: Secondary | ICD-10-CM | POA: Diagnosis not present

## 2022-06-01 DIAGNOSIS — I517 Cardiomegaly: Secondary | ICD-10-CM | POA: Diagnosis not present

## 2022-06-01 DIAGNOSIS — I700 Atherosclerosis of aorta: Secondary | ICD-10-CM | POA: Diagnosis not present

## 2022-06-01 DIAGNOSIS — R059 Cough, unspecified: Secondary | ICD-10-CM | POA: Diagnosis not present

## 2022-06-01 DIAGNOSIS — R0989 Other specified symptoms and signs involving the circulatory and respiratory systems: Secondary | ICD-10-CM | POA: Diagnosis not present

## 2022-06-01 DIAGNOSIS — R058 Other specified cough: Secondary | ICD-10-CM | POA: Diagnosis not present

## 2022-06-01 DIAGNOSIS — R0981 Nasal congestion: Secondary | ICD-10-CM | POA: Diagnosis not present

## 2022-06-01 DIAGNOSIS — R630 Anorexia: Secondary | ICD-10-CM | POA: Diagnosis not present

## 2022-06-02 DIAGNOSIS — E039 Hypothyroidism, unspecified: Secondary | ICD-10-CM | POA: Diagnosis not present

## 2022-06-02 LAB — TSH: TSH: 100 — AB (ref <=5.90)

## 2022-06-05 ENCOUNTER — Ambulatory Visit: Payer: Medicare HMO | Admitting: Nurse Practitioner

## 2022-06-08 DIAGNOSIS — M79621 Pain in right upper arm: Secondary | ICD-10-CM | POA: Diagnosis not present

## 2022-06-08 DIAGNOSIS — M79641 Pain in right hand: Secondary | ICD-10-CM | POA: Diagnosis not present

## 2022-06-08 DIAGNOSIS — M79631 Pain in right forearm: Secondary | ICD-10-CM | POA: Diagnosis not present

## 2022-06-19 ENCOUNTER — Ambulatory Visit: Payer: Self-pay | Admitting: *Deleted

## 2022-06-19 DIAGNOSIS — N179 Acute kidney failure, unspecified: Secondary | ICD-10-CM | POA: Diagnosis not present

## 2022-06-19 DIAGNOSIS — R41841 Cognitive communication deficit: Secondary | ICD-10-CM | POA: Diagnosis not present

## 2022-06-19 NOTE — Chronic Care Management (AMB) (Signed)
  Chronic Care Management   Note  06/19/2022 Name: [name] MRN: [id number] DOB: October 19, 1946   Patient has not recently engaged with the Chronic Care Management RN Care Manager. Removing RN Care Manager from Care Team and closing [place]. If patient is currently engaged with another CCM team member I will forward this encounter to inform them of my case closure. Patient may be eligible for re-engagement with RN Care Manager in the future if necessary and can discuss this with their PCP.  Chong Sicilian, BSN, RN-BC Embedded Chronic Care Manager [facility] Direct Dial: [phone]

## 2022-06-20 DIAGNOSIS — R41841 Cognitive communication deficit: Secondary | ICD-10-CM | POA: Diagnosis not present

## 2022-06-20 DIAGNOSIS — N179 Acute kidney failure, unspecified: Secondary | ICD-10-CM | POA: Diagnosis not present

## 2022-06-21 DIAGNOSIS — R41841 Cognitive communication deficit: Secondary | ICD-10-CM | POA: Diagnosis not present

## 2022-06-21 DIAGNOSIS — N179 Acute kidney failure, unspecified: Secondary | ICD-10-CM | POA: Diagnosis not present

## 2022-06-22 DIAGNOSIS — N179 Acute kidney failure, unspecified: Secondary | ICD-10-CM | POA: Diagnosis not present

## 2022-06-22 DIAGNOSIS — R41841 Cognitive communication deficit: Secondary | ICD-10-CM | POA: Diagnosis not present

## 2022-06-23 DIAGNOSIS — N179 Acute kidney failure, unspecified: Secondary | ICD-10-CM | POA: Diagnosis not present

## 2022-06-23 DIAGNOSIS — R41841 Cognitive communication deficit: Secondary | ICD-10-CM | POA: Diagnosis not present

## 2022-06-26 DIAGNOSIS — Z79899 Other long term (current) drug therapy: Secondary | ICD-10-CM | POA: Diagnosis not present

## 2022-06-26 DIAGNOSIS — E039 Hypothyroidism, unspecified: Secondary | ICD-10-CM | POA: Diagnosis not present

## 2022-06-27 DIAGNOSIS — N179 Acute kidney failure, unspecified: Secondary | ICD-10-CM | POA: Diagnosis not present

## 2022-06-27 DIAGNOSIS — R41841 Cognitive communication deficit: Secondary | ICD-10-CM | POA: Diagnosis not present

## 2022-06-28 DIAGNOSIS — R41841 Cognitive communication deficit: Secondary | ICD-10-CM | POA: Diagnosis not present

## 2022-06-28 DIAGNOSIS — N179 Acute kidney failure, unspecified: Secondary | ICD-10-CM | POA: Diagnosis not present

## 2022-06-29 DIAGNOSIS — R41841 Cognitive communication deficit: Secondary | ICD-10-CM | POA: Diagnosis not present

## 2022-06-29 DIAGNOSIS — N179 Acute kidney failure, unspecified: Secondary | ICD-10-CM | POA: Diagnosis not present

## 2022-06-30 DIAGNOSIS — N179 Acute kidney failure, unspecified: Secondary | ICD-10-CM | POA: Diagnosis not present

## 2022-06-30 DIAGNOSIS — R41841 Cognitive communication deficit: Secondary | ICD-10-CM | POA: Diagnosis not present

## 2022-07-03 DIAGNOSIS — N179 Acute kidney failure, unspecified: Secondary | ICD-10-CM | POA: Diagnosis not present

## 2022-07-03 DIAGNOSIS — R41841 Cognitive communication deficit: Secondary | ICD-10-CM | POA: Diagnosis not present

## 2022-07-04 DIAGNOSIS — N179 Acute kidney failure, unspecified: Secondary | ICD-10-CM | POA: Diagnosis not present

## 2022-07-04 DIAGNOSIS — R41841 Cognitive communication deficit: Secondary | ICD-10-CM | POA: Diagnosis not present

## 2022-07-05 ENCOUNTER — Ambulatory Visit (INDEPENDENT_AMBULATORY_CARE_PROVIDER_SITE_OTHER): Payer: Medicare HMO | Admitting: Nurse Practitioner

## 2022-07-05 ENCOUNTER — Encounter: Payer: Self-pay | Admitting: Nurse Practitioner

## 2022-07-05 VITALS — BP 134/80 | HR 76 | Ht 68.0 in

## 2022-07-05 DIAGNOSIS — E039 Hypothyroidism, unspecified: Secondary | ICD-10-CM

## 2022-07-05 MED ORDER — LEVOTHYROXINE SODIUM 150 MCG PO TABS: 150.0000 ug | ORAL_TABLET | Freq: Every day | ORAL | 1 refills | Status: DC

## 2022-07-05 NOTE — Patient Instructions (Signed)

## 2022-07-05 NOTE — Progress Notes (Signed)
Endocrinology Follow Up Note                                         07/05/2022, 10:31 AM  Subjective:   Subjective    [name] is a 76 y.o.-year-old female patient being seen in follow up after being seen in consultation for hypothyroidism referred by Claretta Fraise, MD.   Past Medical History:  Diagnosis Date   Acute pyelonephritis 10/15/2019   Acute renal failure superimposed on stage 2 chronic kidney disease ([place]) 10/15/2019   Alcoholism ([place]) 01/09/2012   Anxiety    Arthritis    Left knee   B12 deficiency    Carotid artery narrowing 10/03/2012   On the right.   Chronic kidney disease (CKD), stage II (mild)    Depression    Headache(784.0)    Hyperlipidemia    Hypertension    Hyponatremia 01/09/2012   Hypothyroid    Incontinence of urine in female    Menopause    Mini stroke    Orthostatic syncope 10/02/2012   SBO (small bowel obstruction) ([place]) 06/28/2016   Stroke ([place]) 2010   no deficits   Thrombocytopenia ([place]) 10/15/2019    Past Surgical History:  Procedure Laterality Date   AGILE CAPSULE N/A 02/22/2016   dummy capsule remained in the distal ileum   APPENDECTOMY     CATARACT EXTRACTION W/PHACO  06/20/2012   Procedure: CATARACT EXTRACTION PHACO AND INTRAOCULAR LENS PLACEMENT ([place]);  Surgeon: Tonny Branch, MD;  Location: [facility];  Service: Ophthalmology;  Laterality: Right;  CDE:10.66   CATARACT EXTRACTION W/PHACO  07/11/2012   Procedure: CATARACT EXTRACTION PHACO AND INTRAOCULAR LENS PLACEMENT ([facility]);  Surgeon: Tonny Branch, MD;  Location: [facility];  Service: Ophthalmology;  Laterality: Left;  CDE: 12.69   COLONOSCOPY N/A 12/17/2015   RMR: Marked circumferential ulceration of the ascending colon/Ileocecal valve ulceration with biopsy most consistent with NSAID injury versus inflammatory bowel disease, pancolonoc diverticulosis redundant colon   ESOPHAGOGASTRODUODENOSCOPY N/A 12/17/2015   HER:[id number]  gastric polyp, biopsy benign   KNEE ARTHROSCOPY Right 09/2016   LUMBAR LAMINECTOMY/DECOMPRESSION MICRODISCECTOMY N/A 05/25/2017   Procedure: CENTRAL DECOMPRESSIVE LUMBAR LAMINECTOMY FOR SPINAL STENOSIS L3-L4, FORAMINOTOMY FOR THE L4 ROOT AND L5 ROOT;  Surgeon: Latanya Maudlin, MD;  Location: [facility];  Service: Orthopedics;  Laterality: N/A;   TONSILLECTOMY     TOTAL KNEE ARTHROPLASTY Left 10/31/2021   Procedure: TOTAL KNEE ARTHROPLASTY;  Surgeon: Gaynelle Arabian, MD;  Location: [facility];  Service: Orthopedics;  Laterality: Left;    Social History   Socioeconomic History   Marital status: Married    Spouse name: Maricela Curet"   Number of children: 2   Years of education: 14   Highest education level: Some college, no degree  Occupational History   Occupation: Psychologist, educational: [employer]    Comment: Retired  Tobacco Use   Smoking status: Former    Packs/day: 2.00    Years: 30.00    Total pack years: 60.00  Types: Cigarettes    Quit date: 06/17/1992    Years since quitting: 30.0   Smokeless tobacco: Never  Vaping Use   Vaping Use: Never used  Substance and Sexual Activity   Alcohol use: No   Drug use: No   Sexual activity: Not Currently  Other Topics Concern   Not on file  Social History Narrative   Lives on one level with her husband, Laruth Bouchard lives 3 streets over   Social Determinants of Health   Financial Resource Strain: Low Risk  (11/28/2021)   Overall Financial Resource Strain (CARDIA)    Difficulty of Paying Living Expenses: Not hard at all  Food Insecurity: No Food Insecurity (11/28/2021)   Hunger Vital Sign    Worried About Running Out of Food in the Last Year: Never true    Ran Out of Food in the Last Year: Never true  Transportation Needs: No Transportation Needs (11/28/2021)   PRAPARE - Hydrologist (Medical): No    Lack of Transportation (Non-Medical): No  Physical Activity: Insufficiently Active  (11/28/2021)   Exercise Vital Sign    Days of Exercise per Week: 7 days    Minutes of Exercise per Session: 10 min  Stress: No Stress Concern Present (11/28/2021)   [place]    Feeling of Stress : Not at all  Social Connections: Moderately Isolated (11/28/2021)   Social Connection and Isolation Panel [NHANES]    Frequency of Communication with Friends and Family: More than three times a week    Frequency of Social Gatherings with Friends and Family: More than three times a week    Attends Religious Services: Never    Marine scientist or Organizations: No    Attends Archivist Meetings: Never    Marital Status: Married    Family History  Problem Relation Age of Onset   GER disease Mother    Ulcers Mother    Hypertension Maternal Grandmother     Outpatient Encounter Medications as of 07/05/2022  Medication Sig   acetaminophen (TYLENOL) 500 MG tablet Take 1,000 mg by mouth every 12 (twelve) hours as needed.   atorvastatin (LIPITOR) 20 MG tablet Take 20 mg by mouth daily.   metoprolol succinate (TOPROL-XL) 50 MG 24 hr tablet Take 1 tablet (50 mg total) by mouth daily. Take with or immediately following a meal.   Multiple Vitamin (MULTIVITAMIN) tablet Take 1 tablet by mouth daily.   Omega 3 1000 MG CAPS Take by mouth daily.   saccharomyces boulardii (FLORASTOR) 250 MG capsule Take 1 capsule (250 mg total) by mouth 2 (two) times daily.   vitamin B-12 (CYANOCOBALAMIN) 500 MCG tablet Take 500 mcg by mouth daily.   vitamin C (ASCORBIC ACID) 500 MG tablet Take 500 mg by mouth daily.   [DISCONTINUED] levothyroxine (SYNTHROID) 100 MCG tablet Take 1 tablet (100 mcg total) by mouth daily.   feeding supplement (ENSURE ENLIVE / ENSURE PLUS) LIQD Take 237 mLs by mouth 2 (two) times daily between meals.   levothyroxine (SYNTHROID) 150 MCG tablet Take 1 tablet (150 mcg total) by mouth daily before breakfast.    [DISCONTINUED] ferrous sulfate 325 (65 FE) MG tablet Take 325 mg by mouth daily with breakfast.   [DISCONTINUED] folic acid (FOLVITE) 1 MG tablet Take 1 mg by mouth daily.   [DISCONTINUED] megestrol (MEGACE) 400 MG/10ML suspension Take 10 mLs (400 mg total) by mouth 2 (two) times daily.   [  DISCONTINUED] potassium chloride SA (KLOR-CON M) 20 MEQ tablet Take one tablet bid x 3 days, then take one tablet once a day   [DISCONTINUED] thiamine 100 MG tablet Take 100 mg by mouth daily.   No facility-administered encounter medications on file as of 07/05/2022.    ALLERGIES: Allergies  Allergen Reactions   Ciprofloxacin Other (See Comments)    Caused pt to have delusional thoughts and hallucinations   Procardia [Nifedipine] Swelling    Edema     Ativan [Lorazepam]    Norvasc [Amlodipine Besylate] Hives   VACCINATION STATUS: Immunization History  Administered Date(s) Administered   Fluad Quad(high Dose 65+) 09/29/2019, 09/07/2020   Influenza, High Dose Seasonal PF 09/23/2014, 09/11/2016, 09/05/2017, 09/16/2018   Influenza,inj,Quad PF,6+ Mos 10/01/2013, 09/10/2015   Moderna SARS-COV2 Booster Vaccination 11/24/2020, 09/07/2021   Moderna Sars-Covid-2 Vaccination 02/19/2020, 03/24/2020   Pneumococcal Conjugate-13 05/12/2016   Pneumococcal Polysaccharide-23 01/10/2012, 04/27/2015   Tdap 12/14/2015   Zoster Recombinat (Shingrix) 11/28/2019, 04/22/2020     HPI   Demetrica Zipp  is a patient with the above medical history. She resides in a skilled nursing facility and is accompanied by a care attendant from the nursing home.  she was diagnosed with hypothyroidism a long time ago (unable to give me approximate age of diagnosis) which required subsequent initiation of thyroid hormone replacement. she was given various doses of Levothyroxine over the years, currently on 200 micrograms (a significantly high dose given her body weight). she reports compliance to this medication:  However she reports her  nursing home attendants have not been giving it to her separately as asked at last visit.   I reviewed patient's thyroid tests:  Lab Results  Component Value Date   TSH 100.00 (A) 06/02/2022   TSH 122.00 (A) 05/23/2022   TSH 100.00 (A) 03/29/2022   TSH 174.548 (H) 03/14/2022   TSH 179.378 (H) 03/14/2022   TSH 100.00 (A) 03/14/2022   TSH 0.425 (L) 07/26/2021   TSH <0.005 (L) 04/25/2021   TSH 0.030 (L) 01/26/2021   TSH 0.017 (L) 11/02/2020   FREET4 <0.25 (L) 03/14/2022   FREET4 1.29 07/26/2021   FREET4 4.58 (H) 04/25/2021   FREET4 2.34 (H) 01/26/2021   FREET4 3.04 (H) 11/02/2020   FREET4 2.21 (H) 09/07/2020   FREET4 2.09 (H) 03/03/2020   FREET4 1.30 01/19/2020   FREET4 0.62 (L) 10/29/2019   FREET4 1.42 07/16/2019      Pt denies feeling nodules in neck, hoarseness, dysphagia/odynophagia, SOB with lying down.  she is unaware of any family history of thyroid disorders.  No family history of thyroid cancer.  No history of radiation therapy to head or neck.  No recent use of iodine supplements.  Denies use of Biotin containing supplements.  I reviewed her chart and she also has a history of AKI, encephalopathy, Cdiff, HTN, HLD, failure to thrive, TIA, CVA.   Review of systems  Constitutional: + Minimally fluctuating body weight,  current Body mass index is 19.77 kg/m. , no fatigue, no subjective hyperthermia, no subjective hypothermia Eyes: no blurry vision, no xerophthalmia ENT: no sore throat, no nodules palpated in throat, no dysphagia/odynophagia, no hoarseness Cardiovascular: no chest pain, no shortness of breath, no palpitations, no leg swelling Respiratory: no cough, no shortness of breath Gastrointestinal: no nausea/vomiting/diarrhea Musculoskeletal: no muscle/joint aches Skin: no rashes, no hyperemia Neurological: no tremors, no numbness, no tingling, no dizziness Psychiatric: no depression, no anxiety   Objective:   Objective     BP 134/80   Pulse  76   Ht  '5\' 8"'$  (1.727 m)   LMP 08/21/2013   BMI 19.77 kg/m  Wt Readings from Last 3 Encounters:  03/13/22 130 lb (59 kg)  03/03/22 130 lb (59 kg)  01/28/22 120 lb 5.9 oz (54.6 kg)    BP Readings from Last 3 Encounters:  07/05/22 134/80  04/05/22 112/67  03/17/22 119/73     Constitutional:  Body mass index is 19.77 kg/m., not in acute distress Eyes: PERRLA, EOMI, no exophthalmos ENT: moist mucous membranes, no thyromegaly, no cervical lymphadenopathy Cardiovascular: normal precordial activity, RRR, no murmur/rubs/gallops Respiratory:  adequate breathing efforts, no gross chest deformity, Clear to auscultation bilaterally Gastrointestinal: abdomen soft, non-tender, no distension, bowel sounds present Musculoskeletal: WC bound due to deconditioning Skin: moist, warm, no rashes Neurological: no tremor with outstretched hands, deep tendon reflexes normal in BLE.   CMP ( most recent) CMP     Component Value Date/Time   NA 132 (L) 03/17/2022 0515   NA 139 09/21/2021 1558   K 3.4 (L) 03/17/2022 0515   CL 106 03/17/2022 0515   CO2 18 (L) 03/17/2022 0515   GLUCOSE 87 03/17/2022 0515   BUN 11 03/17/2022 0515   BUN 9 09/21/2021 1558   CREATININE 1.40 (H) 03/17/2022 0515   CREATININE 1.59 (H) 01/27/2016 1135   CALCIUM 8.5 (L) 03/17/2022 0515   PROT 5.8 (L) 03/14/2022 0341   PROT 6.4 09/21/2021 1558   ALBUMIN 2.9 (L) 03/14/2022 0341   ALBUMIN 4.0 09/21/2021 1558   AST 12 (L) 03/14/2022 0341   ALT 8 03/14/2022 0341   ALKPHOS 55 03/14/2022 0341   BILITOT 1.1 03/14/2022 0341   BILITOT 0.7 09/21/2021 1558   GFRNONAA 39 (L) 03/17/2022 0515   GFRAA 54 (L) 01/26/2021 1622     Diabetic Labs (most recent): Lab Results  Component Value Date   HGBA1C 4.7 11/13/2013   HGBA1C 5.0 10/22/2013     Lipid Panel ( most recent) Lipid Panel     Component Value Date/Time   CHOL 145 09/21/2021 1558   TRIG 354 (H) 09/21/2021 1558   HDL 69 09/21/2021 1558   CHOLHDL 2.1 09/21/2021 1558    CHOLHDL 4.5 10/22/2013 0450   VLDL 58 (H) 10/22/2013 0450   LDLCALC 26 09/21/2021 1558   LABVLDL 50 (H) 09/21/2021 1558       Lab Results  Component Value Date   TSH 100.00 (A) 06/02/2022   TSH 122.00 (A) 05/23/2022   TSH 100.00 (A) 03/29/2022   TSH 174.548 (H) 03/14/2022   TSH 179.378 (H) 03/14/2022   TSH 100.00 (A) 03/14/2022   TSH 0.425 (L) 07/26/2021   TSH <0.005 (L) 04/25/2021   TSH 0.030 (L) 01/26/2021   TSH 0.017 (L) 11/02/2020   FREET4 <0.25 (L) 03/14/2022   FREET4 1.29 07/26/2021   FREET4 4.58 (H) 04/25/2021   FREET4 2.34 (H) 01/26/2021   FREET4 3.04 (H) 11/02/2020   FREET4 2.21 (H) 09/07/2020   FREET4 2.09 (H) 03/03/2020   FREET4 1.30 01/19/2020   FREET4 0.62 (L) 10/29/2019   FREET4 1.42 07/16/2019     Thyroid US from 03/14/22 CLINICAL DATA:  Hypothyroid. On thyroid hormone replacement therapy.   EXAM: THYROID ULTRASOUND   TECHNIQUE: Ultrasound examination of the thyroid gland and adjacent soft tissues was performed.   COMPARISON:  None.   FINDINGS: Parenchymal Echotexture: Markedly heterogenous   Isthmus: 0.1 cm   Right lobe: 4.1 x 1.8 x 0.9 cm   Left lobe: 4.3 x 1.0 x 1.0 cm  _________________________________________________________   Estimated total number of nodules >/= 1 cm: 0   Number of spongiform nodules >/=  2 cm not described below (TR1): 0   Number of mixed cystic and solid nodules >/= 1.5 cm not described below ([place]): 0   _________________________________________________________   No discrete nodules are seen within the thyroid gland.   IMPRESSION: Small heterogeneous thyroid gland. Imaging appearance is consistent with chronic thyroid hormone replacement therapy in the setting of hypothyroidism.   No thyroid nodules are visualized.     Electronically Signed   By: Jacqulynn Cadet M.D.   On: 03/14/2022 10:57  06/02/22 0000   Result status: Final  Resulting lab: OTHER  Reference range: 0.41 - 5.90  Value: 100.00  Abnormal    Comment: TSH-122; FT4-0.5; TPO ab- 18; Thyro ab-< 1.0    Assessment & Plan:   ASSESSMENT / PLAN:  1. Hypothyroidism-acquired   Patient with long-standing hypothyroidism, on levothyroxine therapy. On physical exam, patient  does not have gross goiter, thyroid nodules, or neck compression symptoms.  Her antibody testing was negative, ruling out autoimmune etiology.    Her previsit thyroid function tests are consistent with under-replacement, which I suspect is due to malabsorption due to taking it with her other morning meds.  She will benefit from increase in her Levothyroxine to 150 mcg po daily before breakfast.  I again went over specific orders with her care attendant on how it should be taken to maximize absorption.  - We discussed about correct intake of levothyroxine, at fasting, with water, separated by at least 30 minutes from breakfast, and separated by more than 4 hours from calcium, iron, multivitamins, acid reflux medications (PPIs). -Patient is made aware of the fact that thyroid hormone replacement is needed for life, dose to be adjusted by periodic monitoring of thyroid function tests.  - Will check thyroid tests before next visit: TSH, free T4.  -She recently had thyroid ultrasound on 03/14/22 showing small thyroid gland and no suspicious nodularities.     I spent 20 minutes in the care of the patient today including review of labs from Thyroid Function, CMP, and other relevant labs ; imaging/biopsy records (current and previous including abstractions from other facilities); face-to-face time discussing  her lab results and symptoms, medications doses, her options of short and long term treatment based on the latest standards of care / guidelines;   and documenting the encounter.  Marthe Patch  participated in the discussions, expressed understanding, and voiced agreement with the above plans.  All questions were answered to her satisfaction. she is encouraged to  contact clinic should she have any questions or concerns prior to her return visit.   FOLLOW UP PLAN:  Return in about 2 months (around 09/05/2022) for Thyroid follow up, Previsit labs.  Rayetta Pigg, [facility] [address] [place],  [address] Phone: [phone] Fax: [phone]  07/05/2022, 10:31 AM

## 2022-07-11 DIAGNOSIS — E039 Hypothyroidism, unspecified: Secondary | ICD-10-CM | POA: Diagnosis not present

## 2022-07-11 DIAGNOSIS — Z79899 Other long term (current) drug therapy: Secondary | ICD-10-CM | POA: Diagnosis not present

## 2022-08-01 ENCOUNTER — Other Ambulatory Visit: Payer: Self-pay | Admitting: Family Medicine

## 2022-08-01 DIAGNOSIS — Z1231 Encounter for screening mammogram for malignant neoplasm of breast: Secondary | ICD-10-CM

## 2022-08-08 DIAGNOSIS — Z79899 Other long term (current) drug therapy: Secondary | ICD-10-CM | POA: Diagnosis not present

## 2022-08-08 DIAGNOSIS — B3731 Acute candidiasis of vulva and vagina: Secondary | ICD-10-CM | POA: Diagnosis not present

## 2022-08-15 DIAGNOSIS — B3731 Acute candidiasis of vulva and vagina: Secondary | ICD-10-CM | POA: Diagnosis not present

## 2022-08-15 DIAGNOSIS — R300 Dysuria: Secondary | ICD-10-CM | POA: Diagnosis not present

## 2022-08-15 DIAGNOSIS — R399 Unspecified symptoms and signs involving the genitourinary system: Secondary | ICD-10-CM | POA: Diagnosis not present

## 2022-08-15 DIAGNOSIS — D649 Anemia, unspecified: Secondary | ICD-10-CM | POA: Diagnosis not present

## 2022-08-17 DIAGNOSIS — D649 Anemia, unspecified: Secondary | ICD-10-CM | POA: Diagnosis not present

## 2022-08-17 DIAGNOSIS — N390 Urinary tract infection, site not specified: Secondary | ICD-10-CM | POA: Diagnosis not present

## 2022-08-18 DIAGNOSIS — D649 Anemia, unspecified: Secondary | ICD-10-CM | POA: Diagnosis not present

## 2022-08-22 DIAGNOSIS — D649 Anemia, unspecified: Secondary | ICD-10-CM | POA: Diagnosis not present

## 2022-08-23 DIAGNOSIS — D649 Anemia, unspecified: Secondary | ICD-10-CM | POA: Diagnosis not present

## 2022-08-25 DIAGNOSIS — D649 Anemia, unspecified: Secondary | ICD-10-CM | POA: Diagnosis not present

## 2022-08-28 DIAGNOSIS — D649 Anemia, unspecified: Secondary | ICD-10-CM | POA: Diagnosis not present

## 2022-08-29 ENCOUNTER — Ambulatory Visit
Admission: RE | Admit: 2022-08-29 | Discharge: 2022-08-29 | Disposition: A | Discharge status: Home / Self Care | Payer: Medicare HMO | Source: Ambulatory Visit | Attending: Family Medicine | Admitting: Family Medicine

## 2022-08-29 DIAGNOSIS — E039 Hypothyroidism, unspecified: Secondary | ICD-10-CM | POA: Diagnosis not present

## 2022-08-29 DIAGNOSIS — Z1231 Encounter for screening mammogram for malignant neoplasm of breast: Secondary | ICD-10-CM

## 2022-08-30 DIAGNOSIS — E039 Hypothyroidism, unspecified: Secondary | ICD-10-CM | POA: Diagnosis not present

## 2022-08-30 DIAGNOSIS — D649 Anemia, unspecified: Secondary | ICD-10-CM | POA: Diagnosis not present

## 2022-08-30 DIAGNOSIS — I10 Essential (primary) hypertension: Secondary | ICD-10-CM | POA: Diagnosis not present

## 2022-08-30 DIAGNOSIS — Z8673 Personal history of transient ischemic attack (TIA), and cerebral infarction without residual deficits: Secondary | ICD-10-CM | POA: Diagnosis not present

## 2022-08-30 DIAGNOSIS — E785 Hyperlipidemia, unspecified: Secondary | ICD-10-CM | POA: Diagnosis not present

## 2022-08-30 DIAGNOSIS — E538 Deficiency of other specified B group vitamins: Secondary | ICD-10-CM | POA: Diagnosis not present

## 2022-08-30 DIAGNOSIS — F1021 Alcohol dependence, in remission: Secondary | ICD-10-CM | POA: Diagnosis not present

## 2022-08-30 LAB — TSH: TSH: 0.17 — AB (ref 0.41–5.90)

## 2022-08-31 DIAGNOSIS — Z79899 Other long term (current) drug therapy: Secondary | ICD-10-CM | POA: Diagnosis not present

## 2022-08-31 DIAGNOSIS — E039 Hypothyroidism, unspecified: Secondary | ICD-10-CM | POA: Diagnosis not present

## 2022-09-01 DIAGNOSIS — D649 Anemia, unspecified: Secondary | ICD-10-CM | POA: Diagnosis not present

## 2022-09-04 DIAGNOSIS — D649 Anemia, unspecified: Secondary | ICD-10-CM | POA: Diagnosis not present

## 2022-09-05 DIAGNOSIS — D649 Anemia, unspecified: Secondary | ICD-10-CM | POA: Diagnosis not present

## 2022-09-06 ENCOUNTER — Ambulatory Visit: Payer: Medicare HMO | Admitting: Nurse Practitioner

## 2022-09-06 DIAGNOSIS — B351 Tinea unguium: Secondary | ICD-10-CM | POA: Diagnosis not present

## 2022-09-06 DIAGNOSIS — D649 Anemia, unspecified: Secondary | ICD-10-CM | POA: Diagnosis not present

## 2022-09-06 DIAGNOSIS — I739 Peripheral vascular disease, unspecified: Secondary | ICD-10-CM | POA: Diagnosis not present

## 2022-09-07 DIAGNOSIS — D649 Anemia, unspecified: Secondary | ICD-10-CM | POA: Diagnosis not present

## 2022-09-08 DIAGNOSIS — D649 Anemia, unspecified: Secondary | ICD-10-CM | POA: Diagnosis not present

## 2022-09-19 DIAGNOSIS — Z23 Encounter for immunization: Secondary | ICD-10-CM | POA: Diagnosis not present

## 2022-09-21 ENCOUNTER — Ambulatory Visit (INDEPENDENT_AMBULATORY_CARE_PROVIDER_SITE_OTHER): Payer: Medicare HMO | Admitting: Nurse Practitioner

## 2022-09-21 ENCOUNTER — Encounter: Payer: Self-pay | Admitting: Nurse Practitioner

## 2022-09-21 VITALS — BP 114/64 | HR 75 | Ht 68.0 in

## 2022-09-21 DIAGNOSIS — E039 Hypothyroidism, unspecified: Secondary | ICD-10-CM

## 2022-09-21 NOTE — Patient Instructions (Signed)

## 2022-09-21 NOTE — Progress Notes (Signed)
Endocrinology Follow Up Note                                         09/21/2022, 10:03 AM  Subjective:   Subjective    [name] is a 76 y.o.-year-old female patient being seen in follow up after being seen in consultation for hypothyroidism referred by Claretta Fraise, MD.   Past Medical History:  Diagnosis Date   Acute pyelonephritis 10/15/2019   Acute renal failure superimposed on stage 2 chronic kidney disease ([place]) 10/15/2019   Alcoholism ([place]) 01/09/2012   Anxiety    Arthritis    Left knee   B12 deficiency    Carotid artery narrowing 10/03/2012   On the right.   Chronic kidney disease (CKD), stage II (mild)    Depression    Headache(784.0)    Hyperlipidemia    Hypertension    Hyponatremia 01/09/2012   Hypothyroid    Incontinence of urine in female    Menopause    Mini stroke    Orthostatic syncope 10/02/2012   SBO (small bowel obstruction) ([place]) 06/28/2016   Stroke ([place]) 2010   no deficits   Thrombocytopenia ([place]) 10/15/2019    Past Surgical History:  Procedure Laterality Date   AGILE CAPSULE N/A 02/22/2016   dummy capsule remained in the distal ileum   APPENDECTOMY     CATARACT EXTRACTION W/PHACO  06/20/2012   Procedure: CATARACT EXTRACTION PHACO AND INTRAOCULAR LENS PLACEMENT ([place]);  Surgeon: Tonny Branch, MD;  Location: [facility];  Service: Ophthalmology;  Laterality: Right;  CDE:10.66   CATARACT EXTRACTION W/PHACO  07/11/2012   Procedure: CATARACT EXTRACTION PHACO AND INTRAOCULAR LENS PLACEMENT (IOC);  Surgeon: Tonny Branch, MD;  Location: [facility];  Service: Ophthalmology;  Laterality: Left;  CDE: 12.69   COLONOSCOPY N/A 12/17/2015   RMR: Marked circumferential ulceration of the ascending colon/Ileocecal valve ulceration with biopsy most consistent with NSAID injury versus inflammatory bowel disease, pancolonoc diverticulosis redundant colon   ESOPHAGOGASTRODUODENOSCOPY N/A 12/17/2015   [id number]  gastric polyp, biopsy benign   KNEE ARTHROSCOPY Right 09/2016   LUMBAR LAMINECTOMY/DECOMPRESSION MICRODISCECTOMY N/A 05/25/2017   Procedure: CENTRAL DECOMPRESSIVE LUMBAR LAMINECTOMY FOR SPINAL STENOSIS L3-L4, FORAMINOTOMY FOR THE L4 ROOT AND L5 ROOT;  Surgeon: Latanya Maudlin, MD;  Location: [facility];  Service: Orthopedics;  Laterality: N/A;   TONSILLECTOMY     TOTAL KNEE ARTHROPLASTY Left 10/31/2021   Procedure: TOTAL KNEE ARTHROPLASTY;  Surgeon: Gaynelle Arabian, MD;  Location: [facility];  Service: Orthopedics;  Laterality: Left;    Social History   Socioeconomic History   Marital status: Married    Spouse name: Maricela Curet"   Number of children: 2   Years of education: 14   Highest education level: Some college, no degree  Occupational History   Occupation: Psychologist, educational: [employer]    Comment: Retired  Tobacco Use   Smoking status: Former    Packs/day: 2.00    Years: 30.00    Total pack years: 60.00  Types: Cigarettes    Quit date: 06/17/1992    Years since quitting: 30.2   Smokeless tobacco: Never  Vaping Use   Vaping Use: Never used  Substance and Sexual Activity   Alcohol use: No   Drug use: No   Sexual activity: Not Currently  Other Topics Concern   Not on file  Social History Narrative   Lives on one level with her husband, Laruth Bouchard lives 3 streets over   Social Determinants of Health   Financial Resource Strain: Low Risk  (11/28/2021)   Overall Financial Resource Strain (CARDIA)    Difficulty of Paying Living Expenses: Not hard at all  Food Insecurity: No Food Insecurity (11/28/2021)   Hunger Vital Sign    Worried About Running Out of Food in the Last Year: Never true    Ran Out of Food in the Last Year: Never true  Transportation Needs: No Transportation Needs (11/28/2021)   PRAPARE - Hydrologist (Medical): No    Lack of Transportation (Non-Medical): No  Physical Activity: Insufficiently Active  (11/28/2021)   Exercise Vital Sign    Days of Exercise per Week: 7 days    Minutes of Exercise per Session: 10 min  Stress: No Stress Concern Present (11/28/2021)   [place]    Feeling of Stress : Not at all  Social Connections: Moderately Isolated (11/28/2021)   Social Connection and Isolation Panel [NHANES]    Frequency of Communication with Friends and Family: More than three times a week    Frequency of Social Gatherings with Friends and Family: More than three times a week    Attends Religious Services: Never    Marine scientist or Organizations: No    Attends Music therapist: Never    Marital Status: Married    Family History  Problem Relation Age of Onset   GER disease Mother    Ulcers Mother    Hypertension Maternal Grandmother     Outpatient Encounter Medications as of 09/21/2022  Medication Sig   levothyroxine (SYNTHROID) 125 MCG tablet Take 125 mcg by mouth daily before breakfast.   acetaminophen (TYLENOL) 500 MG tablet Take 1,000 mg by mouth every 12 (twelve) hours as needed.   atorvastatin (LIPITOR) 20 MG tablet Take 20 mg by mouth daily.   feeding supplement (ENSURE ENLIVE / ENSURE PLUS) LIQD Take 237 mLs by mouth 2 (two) times daily between meals.   metoprolol succinate (TOPROL-XL) 50 MG 24 hr tablet Take 1 tablet (50 mg total) by mouth daily. Take with or immediately following a meal.   Multiple Vitamin (MULTIVITAMIN) tablet Take 1 tablet by mouth daily.   Omega 3 1000 MG CAPS Take by mouth daily.   saccharomyces boulardii (FLORASTOR) 250 MG capsule Take 1 capsule (250 mg total) by mouth 2 (two) times daily.   vitamin B-12 (CYANOCOBALAMIN) 500 MCG tablet Take 500 mcg by mouth daily.   vitamin C (ASCORBIC ACID) 500 MG tablet Take 500 mg by mouth daily.   [DISCONTINUED] levothyroxine (SYNTHROID) 150 MCG tablet Take 1 tablet (150 mcg total) by mouth daily before breakfast.   No  facility-administered encounter medications on file as of 09/21/2022.    ALLERGIES: Allergies  Allergen Reactions   Ciprofloxacin Other (See Comments)    Caused pt to have delusional thoughts and hallucinations   Procardia [Nifedipine] Swelling    Edema     Ativan [Lorazepam]  Norvasc [Amlodipine Besylate] Hives   VACCINATION STATUS: Immunization History  Administered Date(s) Administered   Fluad Quad(high Dose 65+) 09/29/2019, 09/07/2020   Influenza, High Dose Seasonal PF 09/23/2014, 09/11/2016, 09/05/2017, 09/16/2018   Influenza,inj,Quad PF,6+ Mos 10/01/2013, 09/10/2015   Moderna SARS-COV2 Booster Vaccination 11/24/2020, 09/07/2021   Moderna Sars-Covid-2 Vaccination 02/19/2020, 03/24/2020   Pneumococcal Conjugate-13 05/12/2016   Pneumococcal Polysaccharide-23 01/10/2012, 04/27/2015   Tdap 12/14/2015   Zoster Recombinat (Shingrix) 11/28/2019, 04/22/2020     HPI   Shawnetta Lein  is a patient with the above medical history. She resides in a skilled nursing facility and is accompanied by a care attendant from the nursing home.  she was diagnosed with hypothyroidism a long time ago (unable to give me approximate age of diagnosis) which required subsequent initiation of thyroid hormone replacement. she was given various doses of Levothyroxine over the years, currently on 200 micrograms (a significantly high dose given her body weight). she reports compliance to this medication:  However she reports her nursing home attendants have not been giving it to her separately as asked at last visit.   I reviewed patient's thyroid tests:  Lab Results  Component Value Date   TSH 0.17 (A) 08/29/2022   TSH 100.00 (A) 06/02/2022   TSH 122.00 (A) 05/23/2022   TSH 100.00 (A) 03/29/2022   TSH 174.548 (H) 03/14/2022   TSH 179.378 (H) 03/14/2022   TSH 100.00 (A) 03/14/2022   TSH 0.425 (L) 07/26/2021   TSH <0.005 (L) 04/25/2021   TSH 0.030 (L) 01/26/2021   FREET4 <0.25 (L) 03/14/2022    FREET4 1.29 07/26/2021   FREET4 4.58 (H) 04/25/2021   FREET4 2.34 (H) 01/26/2021   FREET4 3.04 (H) 11/02/2020   FREET4 2.21 (H) 09/07/2020   FREET4 2.09 (H) 03/03/2020   FREET4 1.30 01/19/2020   FREET4 0.62 (L) 10/29/2019   FREET4 1.42 07/16/2019      Pt denies feeling nodules in neck, hoarseness, dysphagia/odynophagia, SOB with lying down.  she is unaware of any family history of thyroid disorders.  No family history of thyroid cancer.  No history of radiation therapy to head or neck.  No recent use of iodine supplements.  Denies use of Biotin containing supplements.  I reviewed her chart and she also has a history of AKI, encephalopathy, Cdiff, HTN, HLD, failure to thrive, TIA, CVA.   Review of systems  Constitutional: + Minimally fluctuating body weight,  current Body mass index is 19.77 kg/m. , no fatigue, no subjective hyperthermia, no subjective hypothermia Eyes: no blurry vision, no xerophthalmia ENT: no sore throat, no nodules palpated in throat, no dysphagia/odynophagia, no hoarseness Cardiovascular: no chest pain, no shortness of breath, no palpitations, no leg swelling Respiratory: no cough, no shortness of breath Gastrointestinal: no nausea/vomiting/diarrhea Musculoskeletal: no muscle/joint aches Skin: no rashes, no hyperemia Neurological: no tremors, no numbness, no tingling, no dizziness Psychiatric: no depression, no anxiety   Objective:   Objective     BP 114/64 (BP Location: Right Arm, Patient Position: Sitting, Cuff Size: Normal)   Pulse 75   Ht '5\' 8"'$  (1.727 m)   LMP 08/21/2013   BMI 19.77 kg/m  Wt Readings from Last 3 Encounters:  03/13/22 130 lb (59 kg)  03/03/22 130 lb (59 kg)  01/28/22 120 lb 5.9 oz (54.6 kg)    BP Readings from Last 3 Encounters:  09/21/22 114/64  07/05/22 134/80  04/05/22 112/67     Constitutional:  Body mass index is 19.77 kg/m., not in acute distress Eyes: PERRLA,  EOMI, no exophthalmos ENT: moist mucous membranes,  no thyromegaly, no cervical lymphadenopathy Cardiovascular: normal precordial activity, RRR, no murmur/rubs/gallops Respiratory:  adequate breathing efforts, no gross chest deformity, Clear to auscultation bilaterally Gastrointestinal: abdomen soft, non-tender, no distension, bowel sounds present Musculoskeletal: WC bound due to deconditioning Skin: moist, warm, no rashes Neurological: no tremor with outstretched hands, deep tendon reflexes normal in BLE.   CMP ( most recent) CMP     Component Value Date/Time   NA 132 (L) 03/17/2022 0515   NA 139 09/21/2021 1558   K 3.4 (L) 03/17/2022 0515   CL 106 03/17/2022 0515   CO2 18 (L) 03/17/2022 0515   GLUCOSE 87 03/17/2022 0515   BUN 11 03/17/2022 0515   BUN 9 09/21/2021 1558   CREATININE 1.40 (H) 03/17/2022 0515   CREATININE 1.59 (H) 01/27/2016 1135   CALCIUM 8.5 (L) 03/17/2022 0515   PROT 5.8 (L) 03/14/2022 0341   PROT 6.4 09/21/2021 1558   ALBUMIN 2.9 (L) 03/14/2022 0341   ALBUMIN 4.0 09/21/2021 1558   AST 12 (L) 03/14/2022 0341   ALT 8 03/14/2022 0341   ALKPHOS 55 03/14/2022 0341   BILITOT 1.1 03/14/2022 0341   BILITOT 0.7 09/21/2021 1558   GFRNONAA 39 (L) 03/17/2022 0515   GFRAA 54 (L) 01/26/2021 1622     Diabetic Labs (most recent): Lab Results  Component Value Date   HGBA1C 4.7 11/13/2013   HGBA1C 5.0 10/22/2013     Lipid Panel ( most recent) Lipid Panel     Component Value Date/Time   CHOL 145 09/21/2021 1558   TRIG 354 (H) 09/21/2021 1558   HDL 69 09/21/2021 1558   CHOLHDL 2.1 09/21/2021 1558   CHOLHDL 4.5 10/22/2013 0450   VLDL 58 (H) 10/22/2013 0450   LDLCALC 26 09/21/2021 1558   LABVLDL 50 (H) 09/21/2021 1558       Lab Results  Component Value Date   TSH 0.17 (A) 08/29/2022   TSH 100.00 (A) 06/02/2022   TSH 122.00 (A) 05/23/2022   TSH 100.00 (A) 03/29/2022   TSH 174.548 (H) 03/14/2022   TSH 179.378 (H) 03/14/2022   TSH 100.00 (A) 03/14/2022   TSH 0.425 (L) 07/26/2021   TSH <0.005 (L)  04/25/2021   TSH 0.030 (L) 01/26/2021   FREET4 <0.25 (L) 03/14/2022   FREET4 1.29 07/26/2021   FREET4 4.58 (H) 04/25/2021   FREET4 2.34 (H) 01/26/2021   FREET4 3.04 (H) 11/02/2020   FREET4 2.21 (H) 09/07/2020   FREET4 2.09 (H) 03/03/2020   FREET4 1.30 01/19/2020   FREET4 0.62 (L) 10/29/2019   FREET4 1.42 07/16/2019     Thyroid US from 03/14/22 CLINICAL DATA:  Hypothyroid. On thyroid hormone replacement therapy.   EXAM: THYROID ULTRASOUND   TECHNIQUE: Ultrasound examination of the thyroid gland and adjacent soft tissues was performed.   COMPARISON:  None.   FINDINGS: Parenchymal Echotexture: Markedly heterogenous   Isthmus: 0.1 cm   Right lobe: 4.1 x 1.8 x 0.9 cm   Left lobe: 4.3 x 1.0 x 1.0 cm   _________________________________________________________   Estimated total number of nodules >/= 1 cm: 0   Number of spongiform nodules >/=  2 cm not described below (TR1): 0   Number of mixed cystic and solid nodules >/= 1.5 cm not described below (TR2): 0   _________________________________________________________   No discrete nodules are seen within the thyroid gland.   IMPRESSION: Small heterogeneous thyroid gland. Imaging appearance is consistent with chronic thyroid hormone replacement therapy in the setting of  hypothyroidism.   No thyroid nodules are visualized.     Electronically Signed   By: Jacqulynn Cadet M.D.   On: 03/14/2022 10:57  06/02/22 0000   Result status: Final  Resulting lab: OTHER  Reference range: 0.41 - 5.90  Value: 100.00 Abnormal    Comment: TSH-122; FT4-0.5; TPO ab- 18; Thyro ab-< 1.0  TSH TSH Resulted: 08/29/22 0000  Result status: Final  Resulting lab: [facility]  Reference range: 0.41 - 5.90  Value: 0.17 Abnormal    Comment: T4,FREE 1.83    Assessment & Plan:   ASSESSMENT / PLAN:  1. Hypothyroidism-acquired   Patient with long-standing hypothyroidism, on levothyroxine therapy. On physical exam, patient  does not have  gross goiter, thyroid nodules, or neck compression symptoms.  Her antibody testing was negative, ruling out autoimmune etiology.    Her previsit thyroid function tests are consistent with slight over-replacement.  Her primary provider already decreased her Levothyroxine to 125 mcg po daily, she is advised to continue.  I suspect her fluctuations are due to malabsorption due to taking it with her other morning meds.    - We discussed about correct intake of levothyroxine, at fasting, with water, separated by at least 30 minutes from breakfast, and separated by more than 4 hours from calcium, iron, multivitamins, acid reflux medications (PPIs). -Patient is made aware of the fact that thyroid hormone replacement is needed for life, dose to be adjusted by periodic monitoring of thyroid function tests.  - Will check thyroid tests before next visit: TSH, free T4.  -She recently had thyroid ultrasound on 03/14/22 showing small thyroid gland and no suspicious nodularities.      I spent 22 minutes in the care of the patient today including review of labs from Thyroid Function, CMP, and other relevant labs ; imaging/biopsy records (current and previous including abstractions from other facilities); face-to-face time discussing  her lab results and symptoms, medications doses, her options of short and long term treatment based on the latest standards of care / guidelines;   and documenting the encounter.  Marthe Patch  participated in the discussions, expressed understanding, and voiced agreement with the above plans.  All questions were answered to her satisfaction. she is encouraged to contact clinic should she have any questions or concerns prior to her return visit.   FOLLOW UP PLAN:  Return in about 4 months (around 01/22/2023) for Thyroid follow up, Previsit labs.  Rayetta Pigg, [facility] [address] [place], [place] [address] Phone: [phone] Fax:  [phone]  09/21/2022, 10:03 AM

## 2022-09-25 DIAGNOSIS — E785 Hyperlipidemia, unspecified: Secondary | ICD-10-CM | POA: Diagnosis not present

## 2022-09-25 DIAGNOSIS — E039 Hypothyroidism, unspecified: Secondary | ICD-10-CM | POA: Diagnosis not present

## 2022-09-25 DIAGNOSIS — Z7189 Other specified counseling: Secondary | ICD-10-CM | POA: Diagnosis not present

## 2022-09-25 DIAGNOSIS — I10 Essential (primary) hypertension: Secondary | ICD-10-CM | POA: Diagnosis not present

## 2022-09-25 DIAGNOSIS — Z79899 Other long term (current) drug therapy: Secondary | ICD-10-CM | POA: Diagnosis not present

## 2022-09-26 DIAGNOSIS — Z79899 Other long term (current) drug therapy: Secondary | ICD-10-CM | POA: Diagnosis not present

## 2022-09-26 DIAGNOSIS — E538 Deficiency of other specified B group vitamins: Secondary | ICD-10-CM | POA: Diagnosis not present

## 2022-10-02 DIAGNOSIS — E038 Other specified hypothyroidism: Secondary | ICD-10-CM | POA: Diagnosis not present

## 2022-10-02 DIAGNOSIS — D518 Other vitamin B12 deficiency anemias: Secondary | ICD-10-CM | POA: Diagnosis not present

## 2022-10-06 DIAGNOSIS — Z79899 Other long term (current) drug therapy: Secondary | ICD-10-CM | POA: Diagnosis not present

## 2022-10-06 DIAGNOSIS — E039 Hypothyroidism, unspecified: Secondary | ICD-10-CM | POA: Diagnosis not present

## 2022-10-09 DIAGNOSIS — U071 COVID-19: Secondary | ICD-10-CM | POA: Diagnosis not present

## 2022-10-16 DIAGNOSIS — D649 Anemia, unspecified: Secondary | ICD-10-CM | POA: Diagnosis not present

## 2022-11-06 DIAGNOSIS — D649 Anemia, unspecified: Secondary | ICD-10-CM | POA: Diagnosis not present

## 2022-11-06 DIAGNOSIS — M6281 Muscle weakness (generalized): Secondary | ICD-10-CM | POA: Diagnosis not present

## 2022-11-07 DIAGNOSIS — D649 Anemia, unspecified: Secondary | ICD-10-CM | POA: Diagnosis not present

## 2022-11-07 DIAGNOSIS — B351 Tinea unguium: Secondary | ICD-10-CM | POA: Diagnosis not present

## 2022-11-07 DIAGNOSIS — L603 Nail dystrophy: Secondary | ICD-10-CM | POA: Diagnosis not present

## 2022-11-07 DIAGNOSIS — I739 Peripheral vascular disease, unspecified: Secondary | ICD-10-CM | POA: Diagnosis not present

## 2022-11-07 DIAGNOSIS — M6281 Muscle weakness (generalized): Secondary | ICD-10-CM | POA: Diagnosis not present

## 2022-11-08 DIAGNOSIS — D649 Anemia, unspecified: Secondary | ICD-10-CM | POA: Diagnosis not present

## 2022-11-08 DIAGNOSIS — M6281 Muscle weakness (generalized): Secondary | ICD-10-CM | POA: Diagnosis not present

## 2022-11-09 DIAGNOSIS — D649 Anemia, unspecified: Secondary | ICD-10-CM | POA: Diagnosis not present

## 2022-11-09 DIAGNOSIS — M6281 Muscle weakness (generalized): Secondary | ICD-10-CM | POA: Diagnosis not present

## 2022-11-10 DIAGNOSIS — M6281 Muscle weakness (generalized): Secondary | ICD-10-CM | POA: Diagnosis not present

## 2022-11-10 DIAGNOSIS — D649 Anemia, unspecified: Secondary | ICD-10-CM | POA: Diagnosis not present

## 2022-11-13 DIAGNOSIS — M6281 Muscle weakness (generalized): Secondary | ICD-10-CM | POA: Diagnosis not present

## 2022-11-13 DIAGNOSIS — D649 Anemia, unspecified: Secondary | ICD-10-CM | POA: Diagnosis not present

## 2022-11-14 DIAGNOSIS — M6281 Muscle weakness (generalized): Secondary | ICD-10-CM | POA: Diagnosis not present

## 2022-11-14 DIAGNOSIS — D649 Anemia, unspecified: Secondary | ICD-10-CM | POA: Diagnosis not present

## 2022-11-15 DIAGNOSIS — M6281 Muscle weakness (generalized): Secondary | ICD-10-CM | POA: Diagnosis not present

## 2022-11-15 DIAGNOSIS — D649 Anemia, unspecified: Secondary | ICD-10-CM | POA: Diagnosis not present

## 2022-11-16 DIAGNOSIS — D649 Anemia, unspecified: Secondary | ICD-10-CM | POA: Diagnosis not present

## 2022-11-16 DIAGNOSIS — M6281 Muscle weakness (generalized): Secondary | ICD-10-CM | POA: Diagnosis not present

## 2022-11-17 DIAGNOSIS — R309 Painful micturition, unspecified: Secondary | ICD-10-CM | POA: Diagnosis not present

## 2022-11-17 DIAGNOSIS — M6281 Muscle weakness (generalized): Secondary | ICD-10-CM | POA: Diagnosis not present

## 2022-11-17 DIAGNOSIS — R3915 Urgency of urination: Secondary | ICD-10-CM | POA: Diagnosis not present

## 2022-11-17 DIAGNOSIS — D649 Anemia, unspecified: Secondary | ICD-10-CM | POA: Diagnosis not present

## 2022-11-19 DIAGNOSIS — D649 Anemia, unspecified: Secondary | ICD-10-CM | POA: Diagnosis not present

## 2022-11-19 DIAGNOSIS — M6281 Muscle weakness (generalized): Secondary | ICD-10-CM | POA: Diagnosis not present

## 2022-11-20 DIAGNOSIS — N390 Urinary tract infection, site not specified: Secondary | ICD-10-CM | POA: Diagnosis not present

## 2022-11-20 DIAGNOSIS — M6281 Muscle weakness (generalized): Secondary | ICD-10-CM | POA: Diagnosis not present

## 2022-11-20 DIAGNOSIS — D649 Anemia, unspecified: Secondary | ICD-10-CM | POA: Diagnosis not present

## 2022-11-20 DIAGNOSIS — R300 Dysuria: Secondary | ICD-10-CM | POA: Diagnosis not present

## 2022-11-22 DIAGNOSIS — E039 Hypothyroidism, unspecified: Secondary | ICD-10-CM | POA: Diagnosis not present

## 2022-11-22 DIAGNOSIS — I10 Essential (primary) hypertension: Secondary | ICD-10-CM | POA: Diagnosis not present

## 2022-11-22 DIAGNOSIS — D649 Anemia, unspecified: Secondary | ICD-10-CM | POA: Diagnosis not present

## 2022-11-22 DIAGNOSIS — R300 Dysuria: Secondary | ICD-10-CM | POA: Diagnosis not present

## 2022-11-22 DIAGNOSIS — N189 Chronic kidney disease, unspecified: Secondary | ICD-10-CM | POA: Diagnosis not present

## 2022-11-22 DIAGNOSIS — R399 Unspecified symptoms and signs involving the genitourinary system: Secondary | ICD-10-CM | POA: Diagnosis not present

## 2022-11-22 DIAGNOSIS — M6281 Muscle weakness (generalized): Secondary | ICD-10-CM | POA: Diagnosis not present

## 2022-11-22 DIAGNOSIS — N390 Urinary tract infection, site not specified: Secondary | ICD-10-CM | POA: Diagnosis not present

## 2022-11-22 DIAGNOSIS — E785 Hyperlipidemia, unspecified: Secondary | ICD-10-CM | POA: Diagnosis not present

## 2022-11-22 IMAGING — US US THYROID
1 series · 14 of 25 positions shown · non-contrast
Comparison: None.

CLINICAL DATA: Hypothyroid. On thyroid hormone replacement therapy.

EXAM:
THYROID ULTRASOUND
TECHNIQUE: Ultrasound examination of the thyroid gland and adjacent soft
tissues was performed.

[Series 1: us thyroid · 14 of 32 slices shown]
[im 1/32]
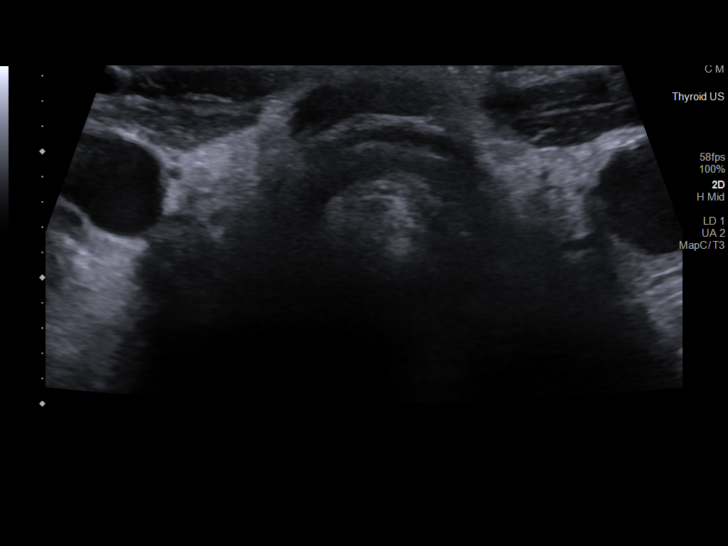
[im 3/32]
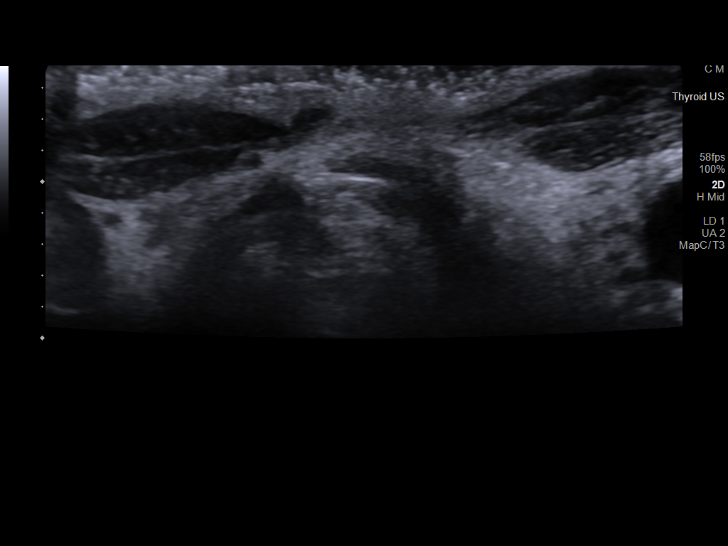
[im 6/32]
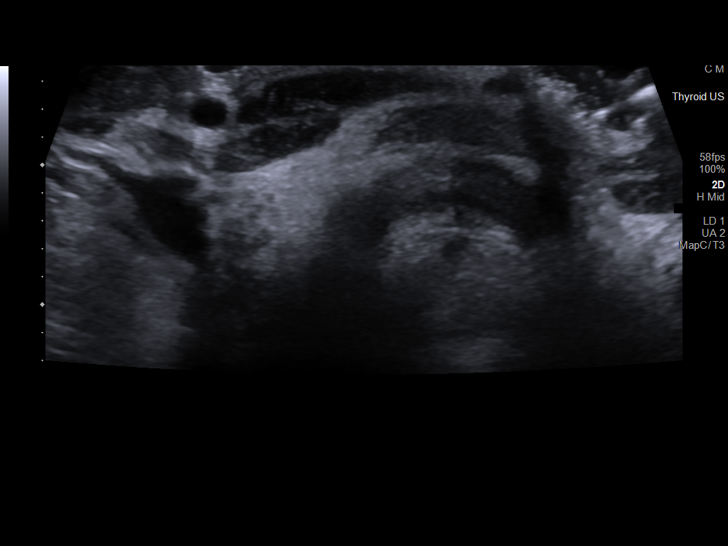
[im 8/32]
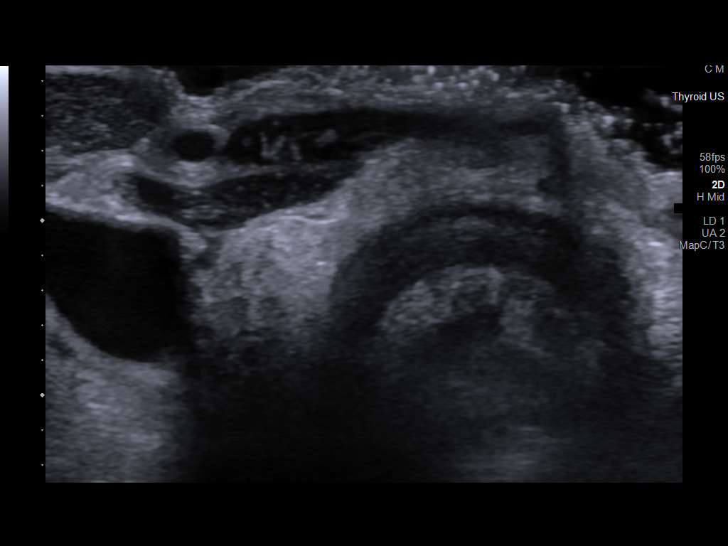
[im 11/32]
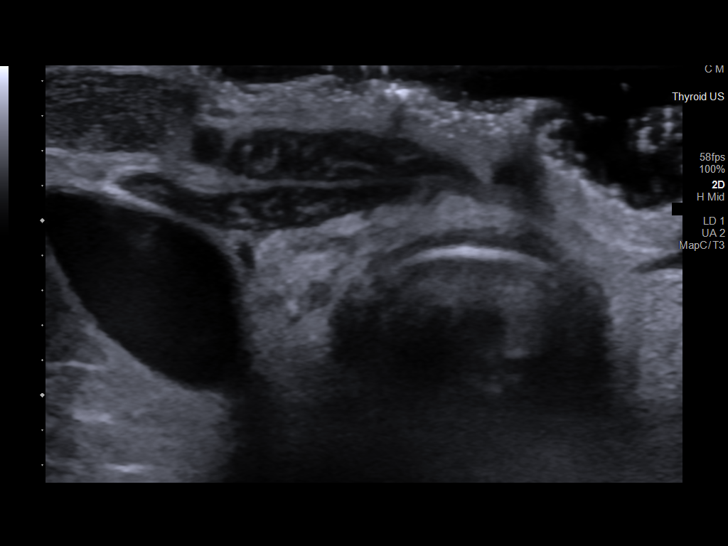
[im 12/32]
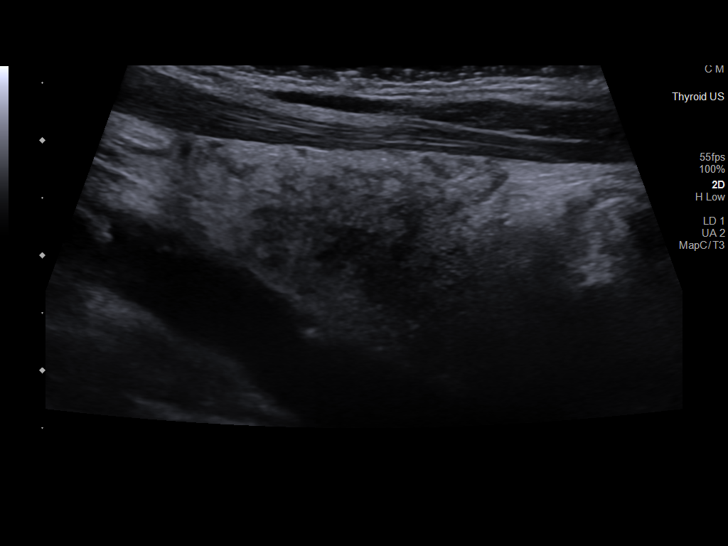
[im 15/32]
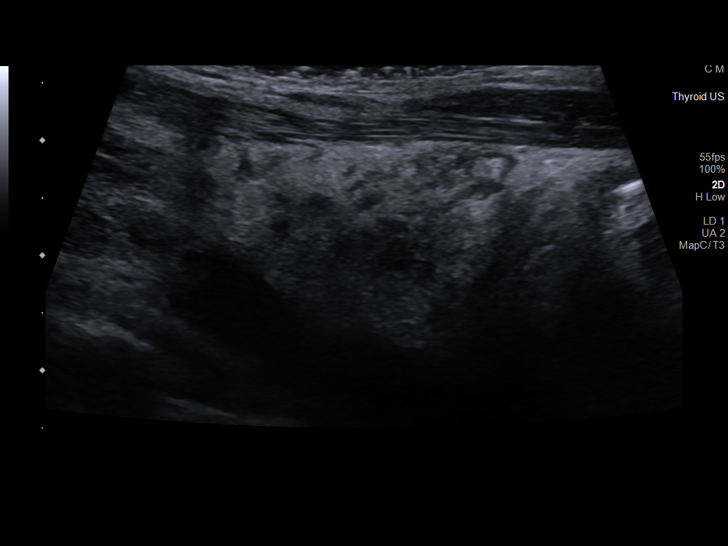
[im 17/32]
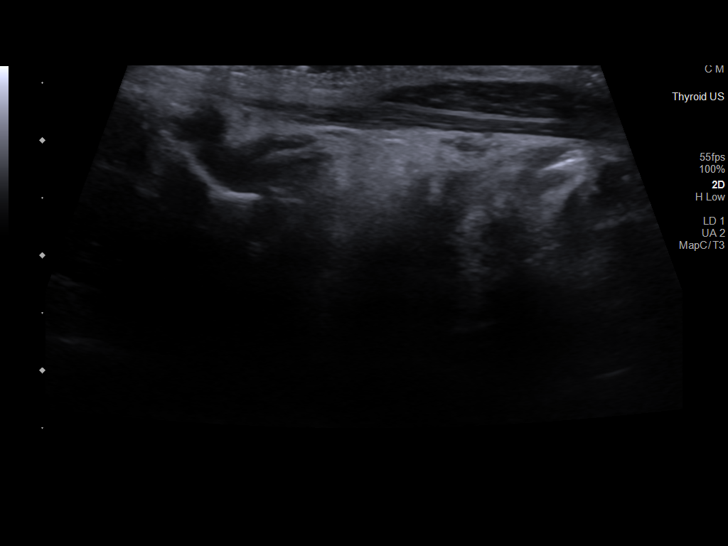
[im 20/32]
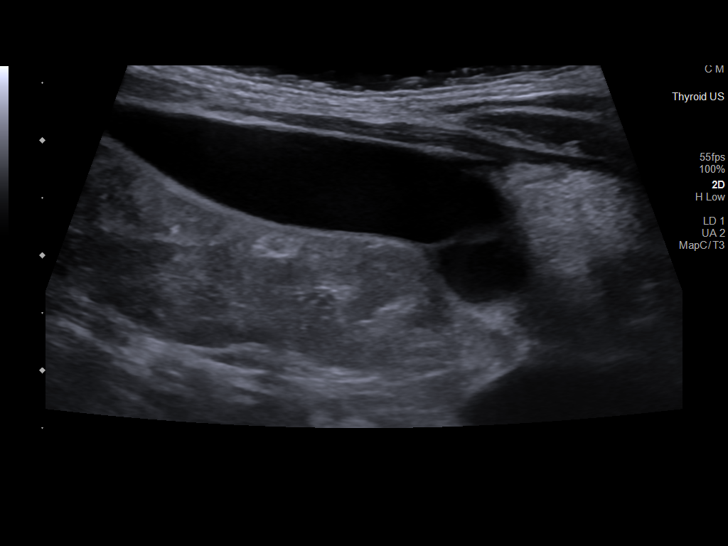
[im 21/32]
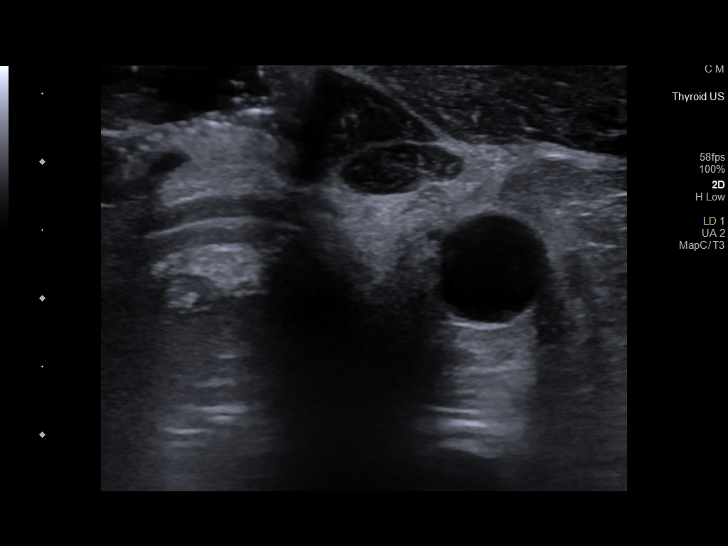
[im 24/32]
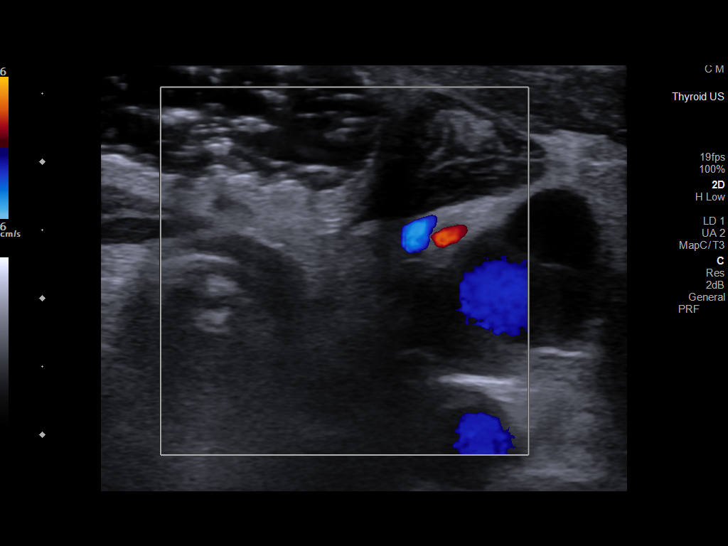
[im 26/32]
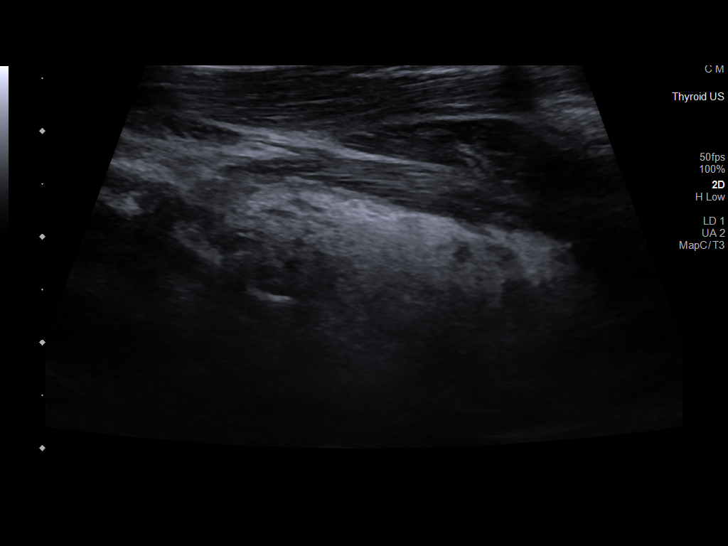
[im 29/32]
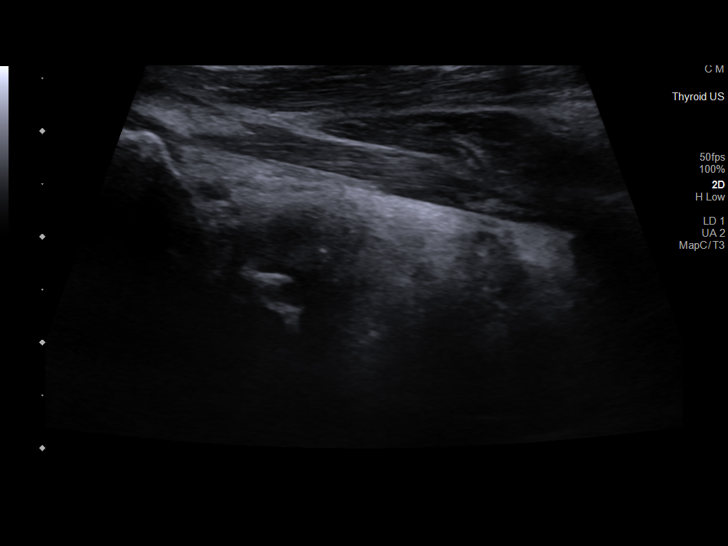
[im 32/32]
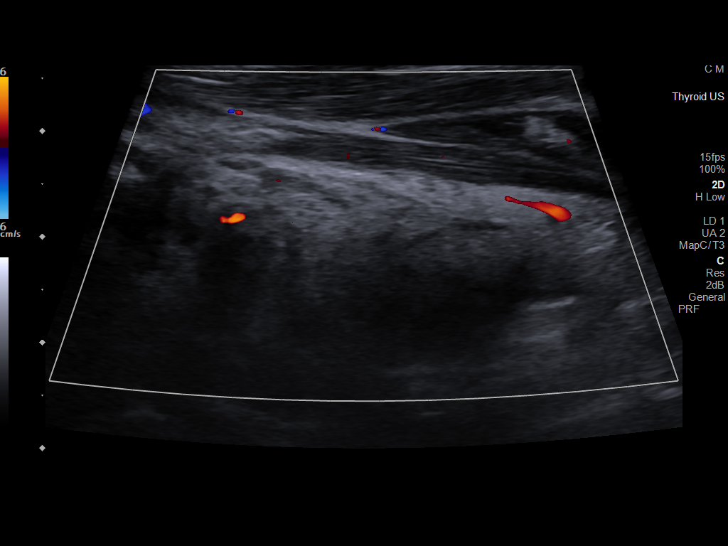

[14 of 25 positions shown; findings below may reference images not displayed]

FINDINGS: Parenchymal Echotexture: Markedly heterogenous

Isthmus: 0.1 cm

Right lobe: 4.1 x 1.8 x 0.9 cm

Left lobe: 4.3 x 1.0 x 1.0 cm

_________________________________________________________

Estimated total number of nodules >/= 1 cm: 0

Number of spongiform nodules >/=  2 cm not described below (TR1): 0

Number of mixed cystic and solid nodules >/= 1.5 cm not described
below (TR2): 0

_________________________________________________________

No discrete nodules are seen within the thyroid gland.
IMPRESSION: Small heterogeneous thyroid gland. Imaging appearance is consistent
with chronic thyroid hormone replacement therapy in the setting of
hypothyroidism.

No thyroid nodules are visualized.

## 2022-11-23 DIAGNOSIS — M6281 Muscle weakness (generalized): Secondary | ICD-10-CM | POA: Diagnosis not present

## 2022-11-23 DIAGNOSIS — D649 Anemia, unspecified: Secondary | ICD-10-CM | POA: Diagnosis not present

## 2022-11-24 DIAGNOSIS — D649 Anemia, unspecified: Secondary | ICD-10-CM | POA: Diagnosis not present

## 2022-11-24 DIAGNOSIS — M6281 Muscle weakness (generalized): Secondary | ICD-10-CM | POA: Diagnosis not present

## 2022-12-13 DIAGNOSIS — E039 Hypothyroidism, unspecified: Secondary | ICD-10-CM | POA: Diagnosis not present

## 2022-12-14 DIAGNOSIS — Z79899 Other long term (current) drug therapy: Secondary | ICD-10-CM | POA: Diagnosis not present

## 2022-12-14 DIAGNOSIS — E039 Hypothyroidism, unspecified: Secondary | ICD-10-CM | POA: Diagnosis not present

## 2022-12-14 LAB — TSH: TSH: 0.07 — AB (ref 0.41–5.90)

## 2022-12-21 DIAGNOSIS — I10 Essential (primary) hypertension: Secondary | ICD-10-CM | POA: Diagnosis not present

## 2022-12-21 DIAGNOSIS — Z8673 Personal history of transient ischemic attack (TIA), and cerebral infarction without residual deficits: Secondary | ICD-10-CM | POA: Diagnosis not present

## 2022-12-21 DIAGNOSIS — R109 Unspecified abdominal pain: Secondary | ICD-10-CM | POA: Diagnosis not present

## 2022-12-21 DIAGNOSIS — D649 Anemia, unspecified: Secondary | ICD-10-CM | POA: Diagnosis not present

## 2022-12-21 DIAGNOSIS — E785 Hyperlipidemia, unspecified: Secondary | ICD-10-CM | POA: Diagnosis not present

## 2022-12-21 DIAGNOSIS — E039 Hypothyroidism, unspecified: Secondary | ICD-10-CM | POA: Diagnosis not present

## 2022-12-21 DIAGNOSIS — N189 Chronic kidney disease, unspecified: Secondary | ICD-10-CM | POA: Diagnosis not present

## 2022-12-22 DIAGNOSIS — R0981 Nasal congestion: Secondary | ICD-10-CM | POA: Diagnosis not present

## 2022-12-22 DIAGNOSIS — R058 Other specified cough: Secondary | ICD-10-CM | POA: Diagnosis not present

## 2022-12-22 DIAGNOSIS — N390 Urinary tract infection, site not specified: Secondary | ICD-10-CM | POA: Diagnosis not present

## 2022-12-22 DIAGNOSIS — R399 Unspecified symptoms and signs involving the genitourinary system: Secondary | ICD-10-CM | POA: Diagnosis not present

## 2022-12-22 DIAGNOSIS — R0989 Other specified symptoms and signs involving the circulatory and respiratory systems: Secondary | ICD-10-CM | POA: Diagnosis not present

## 2022-12-25 DIAGNOSIS — H353130 Nonexudative age-related macular degeneration, bilateral, stage unspecified: Secondary | ICD-10-CM | POA: Diagnosis not present

## 2022-12-25 DIAGNOSIS — H524 Presbyopia: Secondary | ICD-10-CM | POA: Diagnosis not present

## 2022-12-25 DIAGNOSIS — Z961 Presence of intraocular lens: Secondary | ICD-10-CM | POA: Diagnosis not present

## 2022-12-25 DIAGNOSIS — D649 Anemia, unspecified: Secondary | ICD-10-CM | POA: Diagnosis not present

## 2022-12-25 DIAGNOSIS — Z79899 Other long term (current) drug therapy: Secondary | ICD-10-CM | POA: Diagnosis not present

## 2022-12-25 DIAGNOSIS — H52223 Regular astigmatism, bilateral: Secondary | ICD-10-CM | POA: Diagnosis not present

## 2022-12-25 DIAGNOSIS — N390 Urinary tract infection, site not specified: Secondary | ICD-10-CM | POA: Diagnosis not present

## 2022-12-25 DIAGNOSIS — R911 Solitary pulmonary nodule: Secondary | ICD-10-CM | POA: Diagnosis not present

## 2022-12-27 DIAGNOSIS — F419 Anxiety disorder, unspecified: Secondary | ICD-10-CM | POA: Diagnosis not present

## 2022-12-27 DIAGNOSIS — F4320 Adjustment disorder, unspecified: Secondary | ICD-10-CM | POA: Diagnosis not present

## 2022-12-28 DIAGNOSIS — Z79899 Other long term (current) drug therapy: Secondary | ICD-10-CM | POA: Diagnosis not present

## 2022-12-28 DIAGNOSIS — F419 Anxiety disorder, unspecified: Secondary | ICD-10-CM | POA: Diagnosis not present

## 2022-12-29 ENCOUNTER — Other Ambulatory Visit (HOSPITAL_COMMUNITY): Payer: Self-pay | Admitting: Nurse Practitioner

## 2022-12-29 DIAGNOSIS — R911 Solitary pulmonary nodule: Secondary | ICD-10-CM

## 2023-01-04 DIAGNOSIS — F419 Anxiety disorder, unspecified: Secondary | ICD-10-CM | POA: Diagnosis not present

## 2023-01-04 DIAGNOSIS — Z79899 Other long term (current) drug therapy: Secondary | ICD-10-CM | POA: Diagnosis not present

## 2023-01-05 ENCOUNTER — Encounter (HOSPITAL_COMMUNITY): Payer: Self-pay

## 2023-01-05 ENCOUNTER — Ambulatory Visit (HOSPITAL_COMMUNITY): Admission: RE | Admit: 2023-01-05 | Payer: Medicare HMO | Source: Ambulatory Visit

## 2023-01-16 ENCOUNTER — Encounter: Payer: Self-pay | Admitting: Nurse Practitioner

## 2023-01-18 DIAGNOSIS — Z79899 Other long term (current) drug therapy: Secondary | ICD-10-CM | POA: Diagnosis not present

## 2023-01-18 DIAGNOSIS — L2389 Allergic contact dermatitis due to other agents: Secondary | ICD-10-CM | POA: Diagnosis not present

## 2023-01-19 NOTE — Patient Instructions (Incomplete)

## 2023-01-22 ENCOUNTER — Ambulatory Visit: Payer: Medicare HMO | Admitting: Nurse Practitioner

## 2023-01-22 DIAGNOSIS — N760 Acute vaginitis: Secondary | ICD-10-CM | POA: Diagnosis not present

## 2023-01-22 DIAGNOSIS — B9689 Other specified bacterial agents as the cause of diseases classified elsewhere: Secondary | ICD-10-CM | POA: Diagnosis not present

## 2023-01-22 DIAGNOSIS — E039 Hypothyroidism, unspecified: Secondary | ICD-10-CM

## 2023-01-22 NOTE — Progress Notes (Unsigned)
Synopsis: Referred for pulmonary nodule by Claretta Fraise, MD  Subjective:   PATIENT ID: [name] GENDER: female DOB: 76-Apr-1947, MRN: [id number]  No chief complaint on file.  77yF with history of etoh use, carotid stenosis, CKD2, TCP, smoking 60py referred for pulmonary nodule  ***imaging not in canopy  Otherwise pertinent review of systems is negative.  Past Medical History:  Diagnosis Date   Acute pyelonephritis 10/15/2019   Acute renal failure superimposed on stage 2 chronic kidney disease ([place]) 10/15/2019   Alcoholism ([place]) 01/09/2012   Anxiety    Arthritis    Left knee   B12 deficiency    Carotid artery narrowing 10/03/2012   On the right.   Chronic kidney disease (CKD), stage II (mild)    Depression    Headache(784.0)    Hyperlipidemia    Hypertension    Hyponatremia 01/09/2012   Hypothyroid    Incontinence of urine in female    Menopause    Mini stroke    Orthostatic syncope 10/02/2012   SBO (small bowel obstruction) ([place]) 06/28/2016   Stroke ([place]) 2010   no deficits   Thrombocytopenia ([place]) 10/15/2019     Family History  Problem Relation Age of Onset   GER disease Mother    Ulcers Mother    Hypertension Maternal Grandmother      Past Surgical History:  Procedure Laterality Date   AGILE CAPSULE N/A 02/22/2016   dummy capsule remained in the distal ileum   APPENDECTOMY     CATARACT EXTRACTION W/PHACO  06/20/2012   Procedure: CATARACT EXTRACTION PHACO AND INTRAOCULAR LENS PLACEMENT ([place]);  Surgeon: Tonny Branch, MD;  Location: [facility];  Service: Ophthalmology;  Laterality: Right;  CDE:10.66   CATARACT EXTRACTION W/PHACO  07/11/2012   Procedure: CATARACT EXTRACTION PHACO AND INTRAOCULAR LENS PLACEMENT (IOC);  Surgeon: Tonny Branch, MD;  Location: [facility];  Service: Ophthalmology;  Laterality: Left;  CDE: 12.69   COLONOSCOPY N/A 12/17/2015   RMR: Marked circumferential ulceration of the ascending colon/Ileocecal valve ulceration with biopsy most consistent  with NSAID injury versus inflammatory bowel disease, pancolonoc diverticulosis redundant colon   ESOPHAGOGASTRODUODENOSCOPY N/A 12/17/2015   [id number] gastric polyp, biopsy benign   KNEE ARTHROSCOPY Right 09/2016   LUMBAR LAMINECTOMY/DECOMPRESSION MICRODISCECTOMY N/A 05/25/2017   Procedure: CENTRAL DECOMPRESSIVE LUMBAR LAMINECTOMY FOR SPINAL STENOSIS L3-L4, FORAMINOTOMY FOR THE L4 ROOT AND L5 ROOT;  Surgeon: Latanya Maudlin, MD;  Location: [facility];  Service: Orthopedics;  Laterality: N/A;   TONSILLECTOMY     TOTAL KNEE ARTHROPLASTY Left 10/31/2021   Procedure: TOTAL KNEE ARTHROPLASTY;  Surgeon: Gaynelle Arabian, MD;  Location: [facility];  Service: Orthopedics;  Laterality: Left;    Social History   Socioeconomic History   Marital status: Married    Spouse name: Maricela Curet"   Number of children: 2   Years of education: 14   Highest education level: Some college, no degree  Occupational History   Occupation: Psychologist, educational: [employer]    Comment: Retired  Tobacco Use   Smoking status: Former    Packs/day: 2.00    Years: 30.00    Total pack years: 60.00    Types: Cigarettes    Quit date: 06/17/1992    Years since quitting: 30.6   Smokeless tobacco: Never  Vaping Use   Vaping Use: Never used  Substance and Sexual Activity   Alcohol use: No   Drug use: No   Sexual activity: Not Currently  Other Topics Concern   Not  on file  Social History Narrative   Lives on one level with her husband, Laruth Bouchard lives 3 streets over   Social Determinants of Health   Financial Resource Strain: Low Risk  (11/28/2021)   Overall Financial Resource Strain (CARDIA)    Difficulty of Paying Living Expenses: Not hard at all  Food Insecurity: No Food Insecurity (11/28/2021)   Hunger Vital Sign    Worried About Running Out of Food in the Last Year: Never true    Ran Out of Food in the Last Year: Never true  Transportation Needs: No Transportation Needs (11/28/2021)    PRAPARE - Hydrologist (Medical): No    Lack of Transportation (Non-Medical): No  Physical Activity: Insufficiently Active (11/28/2021)   Exercise Vital Sign    Days of Exercise per Week: 7 days    Minutes of Exercise per Session: 10 min  Stress: No Stress Concern Present (11/28/2021)   [place]    Feeling of Stress : Not at all  Social Connections: Moderately Isolated (11/28/2021)   Social Connection and Isolation Panel [NHANES]    Frequency of Communication with Friends and Family: More than three times a week    Frequency of Social Gatherings with Friends and Family: More than three times a week    Attends Religious Services: Never    Marine scientist or Organizations: No    Attends Archivist Meetings: Never    Marital Status: Married  Human resources officer Violence: Not At Risk (11/28/2021)   Humiliation, Afraid, Rape, and Kick questionnaire    Fear of Current or Ex-Partner: No    Emotionally Abused: No    Physically Abused: No    Sexually Abused: No     Allergies  Allergen Reactions   Ciprofloxacin Other (See Comments)    Caused pt to have delusional thoughts and hallucinations   Procardia [Nifedipine] Swelling    Edema     Ativan [Lorazepam]    Norvasc [Amlodipine Besylate] Hives     Outpatient Medications Prior to Visit  Medication Sig Dispense Refill   acetaminophen (TYLENOL) 500 MG tablet Take 1,000 mg by mouth every 12 (twelve) hours as needed.     atorvastatin (LIPITOR) 20 MG tablet Take 20 mg by mouth daily.     feeding supplement (ENSURE ENLIVE / ENSURE PLUS) LIQD Take 237 mLs by mouth 2 (two) times daily between meals. 237 mL 12   levothyroxine (SYNTHROID) 125 MCG tablet Take 125 mcg by mouth daily before breakfast.     metoprolol succinate (TOPROL-XL) 50 MG 24 hr tablet Take 1 tablet (50 mg total) by mouth daily. Take with or immediately following a  meal. 30 tablet 1   Multiple Vitamin (MULTIVITAMIN) tablet Take 1 tablet by mouth daily.     Omega 3 1000 MG CAPS Take by mouth daily.     saccharomyces boulardii (FLORASTOR) 250 MG capsule Take 1 capsule (250 mg total) by mouth 2 (two) times daily. 60 capsule 0   vitamin B-12 (CYANOCOBALAMIN) 500 MCG tablet Take 500 mcg by mouth daily.     vitamin C (ASCORBIC ACID) 500 MG tablet Take 500 mg by mouth daily.     No facility-administered medications prior to visit.       Objective:   Physical Exam:  General appearance: 76 y.o., female, NAD, conversant  Eyes: anicteric sclerae; PERRL, tracking appropriately HENT: NCAT; MMM Neck: Trachea midline; no lymphadenopathy, no  JVD Lungs: CTAB, no crackles, no wheeze, with normal respiratory effort CV: RRR, no murmur  Abdomen: Soft, non-tender; non-distended, BS present  Extremities: No peripheral edema, warm Skin: Normal turgor and texture; no rash Psych: Appropriate affect Neuro: Alert and oriented to person and place, no focal deficit     There were no vitals filed for this visit.   on *** LPM *** RA BMI Readings from Last 3 Encounters:  09/21/22 19.77 kg/m  07/05/22 19.77 kg/m  03/14/22 19.77 kg/m   Wt Readings from Last 3 Encounters:  03/13/22 130 lb (59 kg)  03/03/22 130 lb (59 kg)  01/28/22 120 lb 5.9 oz (54.6 kg)     CBC    Component Value Date/Time   WBC 6.2 03/17/2022 0515   RBC 2.66 (L) 03/17/2022 0515   HGB 8.3 (L) 03/17/2022 0515   HGB 13.1 09/21/2021 1558   HCT 24.8 (L) 03/17/2022 0515   HCT 38.2 09/21/2021 1558   PLT 262 03/17/2022 0515   PLT 203 09/21/2021 1558   MCV 93.2 03/17/2022 0515   MCV 96 09/21/2021 1558   MCH 31.2 03/17/2022 0515   MCHC 33.5 03/17/2022 0515   RDW 15.9 (H) 03/17/2022 0515   RDW 12.2 09/21/2021 1558   LYMPHSABS 0.8 03/13/2022 1521   LYMPHSABS 2.7 09/21/2021 1558   MONOABS 0.2 03/13/2022 1521   EOSABS 0.0 03/13/2022 1521   EOSABS 0.0 09/21/2021 1558   BASOSABS 0.0  03/13/2022 1521   BASOSABS 0.0 09/21/2021 1558    ***  Chest Imaging: ***  Pulmonary Functions Testing Results:     No data to display          FeNO: ***  Pathology: ***  Echocardiogram: ***  Heart Catheterization: ***    Assessment & Plan:    Plan:      Maryjane Hurter, MD [facility] 01/22/2023 7:53 PM

## 2023-01-24 ENCOUNTER — Encounter: Payer: Self-pay | Admitting: Student

## 2023-01-24 ENCOUNTER — Ambulatory Visit (INDEPENDENT_AMBULATORY_CARE_PROVIDER_SITE_OTHER): Payer: Medicare HMO | Admitting: Student

## 2023-01-24 VITALS — BP 128/78 | HR 74 | Temp 97.7°F | Ht 69.0 in | Wt 132.0 lb

## 2023-01-24 DIAGNOSIS — R911 Solitary pulmonary nodule: Secondary | ICD-10-CM

## 2023-01-24 NOTE — Patient Instructions (Signed)
-   you'll be called to schedule CT Chest - I'll see you tentatively in 4 weeks to discuss results

## 2023-01-26 DIAGNOSIS — N760 Acute vaginitis: Secondary | ICD-10-CM | POA: Diagnosis not present

## 2023-01-29 DIAGNOSIS — M6281 Muscle weakness (generalized): Secondary | ICD-10-CM | POA: Diagnosis not present

## 2023-01-29 DIAGNOSIS — I10 Essential (primary) hypertension: Secondary | ICD-10-CM | POA: Diagnosis not present

## 2023-01-29 DIAGNOSIS — E039 Hypothyroidism, unspecified: Secondary | ICD-10-CM | POA: Diagnosis not present

## 2023-01-29 DIAGNOSIS — D649 Anemia, unspecified: Secondary | ICD-10-CM | POA: Diagnosis not present

## 2023-01-29 DIAGNOSIS — F1021 Alcohol dependence, in remission: Secondary | ICD-10-CM | POA: Diagnosis not present

## 2023-01-29 DIAGNOSIS — E785 Hyperlipidemia, unspecified: Secondary | ICD-10-CM | POA: Diagnosis not present

## 2023-01-30 DIAGNOSIS — R21 Rash and other nonspecific skin eruption: Secondary | ICD-10-CM | POA: Diagnosis not present

## 2023-01-30 DIAGNOSIS — M6281 Muscle weakness (generalized): Secondary | ICD-10-CM | POA: Diagnosis not present

## 2023-01-30 DIAGNOSIS — D649 Anemia, unspecified: Secondary | ICD-10-CM | POA: Diagnosis not present

## 2023-01-30 DIAGNOSIS — L239 Allergic contact dermatitis, unspecified cause: Secondary | ICD-10-CM | POA: Diagnosis not present

## 2023-01-31 DIAGNOSIS — D649 Anemia, unspecified: Secondary | ICD-10-CM | POA: Diagnosis not present

## 2023-01-31 DIAGNOSIS — M6281 Muscle weakness (generalized): Secondary | ICD-10-CM | POA: Diagnosis not present

## 2023-01-31 NOTE — Patient Instructions (Signed)

## 2023-02-01 DIAGNOSIS — M6281 Muscle weakness (generalized): Secondary | ICD-10-CM | POA: Diagnosis not present

## 2023-02-01 DIAGNOSIS — D649 Anemia, unspecified: Secondary | ICD-10-CM | POA: Diagnosis not present

## 2023-02-02 ENCOUNTER — Encounter: Payer: Self-pay | Admitting: Nurse Practitioner

## 2023-02-02 ENCOUNTER — Ambulatory Visit (INDEPENDENT_AMBULATORY_CARE_PROVIDER_SITE_OTHER): Payer: Medicare HMO | Admitting: Nurse Practitioner

## 2023-02-02 VITALS — BP 138/80 | HR 74 | Ht 69.0 in

## 2023-02-02 DIAGNOSIS — M6281 Muscle weakness (generalized): Secondary | ICD-10-CM | POA: Diagnosis not present

## 2023-02-02 DIAGNOSIS — E039 Hypothyroidism, unspecified: Secondary | ICD-10-CM | POA: Diagnosis not present

## 2023-02-02 DIAGNOSIS — D649 Anemia, unspecified: Secondary | ICD-10-CM | POA: Diagnosis not present

## 2023-02-02 NOTE — Progress Notes (Signed)
Endocrinology Follow Up Note                                         02/02/2023, 10:16 AM  Subjective:   Subjective    [name] is a 77 y.o.-year-old female patient being seen in follow up after being seen in consultation for hypothyroidism referred by Claretta Fraise, MD.   Past Medical History:  Diagnosis Date   Acute pyelonephritis 10/15/2019   Acute renal failure superimposed on stage 2 chronic kidney disease ([place]) 10/15/2019   Alcoholism ([place]) 01/09/2012   Anxiety    Arthritis    Left knee   B12 deficiency    Carotid artery narrowing 10/03/2012   On the right.   Chronic kidney disease (CKD), stage II (mild)    Depression    Headache(784.0)    Hyperlipidemia    Hypertension    Hyponatremia 01/09/2012   Hypothyroid    Incontinence of urine in female    Menopause    Mini stroke    Orthostatic syncope 10/02/2012   SBO (small bowel obstruction) ([place]) 06/28/2016   Stroke ([place]) 2010   no deficits   Thrombocytopenia ([place]) 10/15/2019    Past Surgical History:  Procedure Laterality Date   AGILE CAPSULE N/A 02/22/2016   dummy capsule remained in the distal ileum   APPENDECTOMY     CATARACT EXTRACTION W/PHACO  06/20/2012   Procedure: CATARACT EXTRACTION PHACO AND INTRAOCULAR LENS PLACEMENT ([place]);  Surgeon: Tonny Branch, MD;  Location: [facility];  Service: Ophthalmology;  Laterality: Right;  CDE:10.66   CATARACT EXTRACTION W/PHACO  07/11/2012   Procedure: CATARACT EXTRACTION PHACO AND INTRAOCULAR LENS PLACEMENT ([facility]);  Surgeon: Tonny Branch, MD;  Location: [facility];  Service: Ophthalmology;  Laterality: Left;  CDE: 12.69   COLONOSCOPY N/A 12/17/2015   RMR: Marked circumferential ulceration of the ascending colon/Ileocecal valve ulceration with biopsy most consistent with NSAID injury versus inflammatory bowel disease, pancolonoc diverticulosis redundant colon   ESOPHAGOGASTRODUODENOSCOPY N/A 12/17/2015   [id number]  gastric polyp, biopsy benign   KNEE ARTHROSCOPY Right 09/2016   LUMBAR LAMINECTOMY/DECOMPRESSION MICRODISCECTOMY N/A 05/25/2017   Procedure: CENTRAL DECOMPRESSIVE LUMBAR LAMINECTOMY FOR SPINAL STENOSIS L3-L4, FORAMINOTOMY FOR THE L4 ROOT AND L5 ROOT;  Surgeon: Latanya Maudlin, MD;  Location[facility];  Service: Orthopedics;  Laterality: N/A;   TONSILLECTOMY     TOTAL KNEE ARTHROPLASTY Left 10/31/2021   Procedure: TOTAL KNEE ARTHROPLASTY;  Surgeon: Gaynelle Arabian, MD;  Location[facility];  Service: Orthopedics;  Laterality: Left;    Social History   Socioeconomic History   Marital status: Married    Spouse name: Maricela Curet"   Number of children: 2   Years of education: 14   Highest education level: Some college, no degree  Occupational History   Occupation: Psychologist, 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  Not currently smoking  Vaping Use   Vaping Use: Never used  Substance and Sexual Activity   Alcohol use: No   Drug use: No   Sexual activity: Not Currently  Other Topics Concern   Not on file  Social History Narrative   Lives on one level with her husband, Laruth Bouchard lives 3 streets over   Social Determinants of Health   Financial Resource Strain: Low Risk  (11/28/2021)   Overall Financial Resource Strain (CARDIA)    Difficulty of Paying Living Expenses: Not hard at all  Food Insecurity: No Food Insecurity (11/28/2021)   Hunger Vital Sign    Worried About Running Out of Food in the Last Year: Never true    Ran Out of Food in the Last Year: Never true  Transportation Needs: No Transportation Needs (11/28/2021)   PRAPARE - Hydrologist (Medical): No     Lack of Transportation (Non-Medical): No  Physical Activity: Insufficiently Active (11/28/2021)   Exercise Vital Sign    Days of Exercise per Week: 7 days    Minutes of Exercise per Session: 10 min  Stress: No Stress Concern Present (11/28/2021)   [place]    Feeling of Stress : Not at all  Social Connections: Moderately Isolated (11/28/2021)   Social Connection and Isolation Panel [NHANES]    Frequency of Communication with Friends and Family: More than three times a week    Frequency of Social Gatherings with Friends and Family: More than three times a week    Attends Religious Services: Never    Marine scientist or Organizations: No    Attends Archivist Meetings: Never    Marital Status: Married    Family History  Problem Relation Age of Onset   GER disease Mother    Ulcers Mother    Hypertension Maternal Grandmother     Outpatient Encounter Medications as of 02/02/2023  Medication Sig   acetaminophen (TYLENOL) 500 MG tablet Take 1,000 mg by mouth every 12 (twelve) hours as needed.   atorvastatin (LIPITOR) 20 MG tablet Take 20 mg by mouth daily.   ferrous sulfate 325 (65 FE) MG tablet Take 325 mg by mouth daily with breakfast.   levothyroxine (SYNTHROID) 100 MCG tablet Take 100 mcg by mouth daily before breakfast.   Menthol-Zinc Oxide 0.44-20.625 % OINT Apply topically.   metoprolol succinate (TOPROL-XL) 50 MG 24 hr tablet Take 1 tablet (50 mg total) by mouth daily. Take with or immediately following a meal.   Multiple Vitamin (MULTIVITAMIN) tablet Take 1 tablet by mouth daily.   Multiple Vitamins-Minerals (OCUVITE ADULT 50+) CAPS Take by mouth.   Omega 3 1000 MG CAPS Take by mouth daily.   ondansetron (ZOFRAN) 8 MG tablet Take by mouth every 8 (eight) hours as needed for nausea or vomiting.   saccharomyces boulardii (FLORASTOR) 250 MG capsule Take 1 capsule (250 mg total) by mouth 2 (two)  times daily.   vitamin B-12 (CYANOCOBALAMIN) 500 MCG tablet Take 500 mcg by mouth daily.   vitamin C (ASCORBIC ACID) 500 MG tablet Take 500 mg by mouth daily.   [DISCONTINUED] levothyroxine (SYNTHROID) 125 MCG tablet Take 100 mcg by mouth daily before breakfast.  feeding supplement (ENSURE ENLIVE / ENSURE PLUS) LIQD Take 237 mLs by mouth 2 (two) times daily between meals. (Patient not taking: Reported on 01/24/2023)   No facility-administered encounter medications on file as of 02/02/2023.    ALLERGIES: Allergies  Allergen Reactions   Ciprofloxacin Other (See Comments)    Caused pt to have delusional thoughts and hallucinations   Procardia [Nifedipine] Swelling    Edema     Ativan [Lorazepam]    Norvasc [Amlodipine Besylate] Hives   VACCINATION STATUS: Immunization History  Administered Date(s) Administered   Fluad Quad(high Dose 65+) 09/29/2019, 09/07/2020   Influenza, High Dose Seasonal PF 09/23/2014, 09/11/2016, 09/05/2017, 09/16/2018   Influenza,inj,Quad PF,6+ Mos 10/01/2013, 09/10/2015   Moderna SARS-COV2 Booster Vaccination 11/24/2020, 09/07/2021   Moderna Sars-Covid-2 Vaccination 02/19/2020, 03/24/2020   Pneumococcal Conjugate-13 05/12/2016   Pneumococcal Polysaccharide-23 01/10/2012, 04/27/2015   Tdap 12/14/2015   Zoster Recombinat (Shingrix) 11/28/2019, 04/22/2020     HPI   Effy Supernaw is a patient with the above medical history. She resides in a skilled nursing facility and is accompanied by a care attendant from the nursing home.   she was diagnosed with hypothyroidism a long time ago (unable to give me approximate age of diagnosis) which required subsequent initiation of thyroid hormone replacement. she was given various doses of Levothyroxine over the years, currently on 100 micrograms . she reports compliance to this medication:  However she reports her nursing home attendants have not been giving it to her separately as asked at last visit.   I reviewed  patient's thyroid tests:  Lab Results  Component Value Date   TSH 0.07 (A) 12/14/2022   TSH 0.17 (A) 08/30/2022   TSH 100.00 (A) 06/02/2022   TSH 122.00 (A) 05/23/2022   TSH 100.00 (A) 03/29/2022   TSH 174.548 (H) 03/14/2022   TSH 179.378 (H) 03/14/2022   TSH 100.00 (A) 03/14/2022   TSH 0.425 (L) 07/26/2021   TSH <0.005 (L) 04/25/2021   FREET4 <0.25 (L) 03/14/2022   FREET4 1.29 07/26/2021   FREET4 4.58 (H) 04/25/2021   FREET4 2.34 (H) 01/26/2021   FREET4 3.04 (H) 11/02/2020   FREET4 2.21 (H) 09/07/2020   FREET4 2.09 (H) 03/03/2020   FREET4 1.30 01/19/2020   FREET4 0.62 (L) 10/29/2019   FREET4 1.42 07/16/2019      Pt denies feeling nodules in neck, hoarseness, dysphagia/odynophagia, SOB with lying down.  she is unaware of any family history of thyroid disorders.  No family history of thyroid cancer.  No history of radiation therapy to head or neck.  No recent use of iodine supplements.  Denies use of Biotin containing supplements.  I reviewed her chart and she also has a history of AKI, encephalopathy, Cdiff, HTN, HLD, failure to thrive, TIA, CVA.   Review of systems  Constitutional: + Minimally fluctuating body weight,  current Body mass index is 19.49 kg/m. , no fatigue, no subjective hyperthermia, no subjective hypothermia Eyes: no blurry vision, no xerophthalmia ENT: no sore throat, no nodules palpated in throat, no dysphagia/odynophagia, no hoarseness Cardiovascular: no chest pain, no shortness of breath, no palpitations, no leg swelling Respiratory: no cough, no shortness of breath Gastrointestinal: no nausea/vomiting/diarrhea Musculoskeletal: no muscle/joint aches Skin: no rashes, no hyperemia Neurological: no tremors, no numbness, no tingling, no dizziness Psychiatric: no depression, no anxiety   Objective:   Objective     BP 138/80 (BP Location: Left Arm, Patient Position: Sitting) Comment: Retake - manuel Cuff  Pulse 74   Ht '5\' 9"'$  (1.753  m)   LMP  08/21/2013   BMI 19.49 kg/m  Wt Readings from Last 3 Encounters:  01/24/23 132 lb (59.9 kg)  03/13/22 130 lb (59 kg)  03/03/22 130 lb (59 kg)    BP Readings from Last 3 Encounters:  02/02/23 138/80  01/24/23 128/78  09/21/22 114/64     Constitutional:  Body mass index is 19.49 kg/m., not in acute distress Eyes: PERRLA, EOMI, no exophthalmos Gastrointestinal: abdomen soft, non-tender, no distension, bowel sounds present Musculoskeletal: WC bound due to deconditioning Skin: moist, warm, no rashes Neurological: no tremor with outstretched hands, deep tendon reflexes normal in BLE.   CMP ( most recent) CMP     Component Value Date/Time   NA 132 (L) 03/17/2022 0515   NA 139 09/21/2021 1558   K 3.4 (L) 03/17/2022 0515   CL 106 03/17/2022 0515   CO2 18 (L) 03/17/2022 0515   GLUCOSE 87 03/17/2022 0515   BUN 11 03/17/2022 0515   BUN 9 09/21/2021 1558   CREATININE 1.40 (H) 03/17/2022 0515   CREATININE 1.59 (H) 01/27/2016 1135   CALCIUM 8.5 (L) 03/17/2022 0515   PROT 5.8 (L) 03/14/2022 0341   PROT 6.4 09/21/2021 1558   ALBUMIN 2.9 (L) 03/14/2022 0341   ALBUMIN 4.0 09/21/2021 1558   AST 12 (L) 03/14/2022 0341   ALT 8 03/14/2022 0341   ALKPHOS 55 03/14/2022 0341   BILITOT 1.1 03/14/2022 0341   BILITOT 0.7 09/21/2021 1558   GFRNONAA 39 (L) 03/17/2022 0515   GFRAA 54 (L) 01/26/2021 1622     Diabetic Labs (most recent): Lab Results  Component Value Date   HGBA1C 4.7 11/13/2013   HGBA1C 5.0 10/22/2013     Lipid Panel ( most recent) Lipid Panel     Component Value Date/Time   CHOL 145 09/21/2021 1558   TRIG 354 (H) 09/21/2021 1558   HDL 69 09/21/2021 1558   CHOLHDL 2.1 09/21/2021 1558   CHOLHDL 4.5 10/22/2013 0450   VLDL 58 (H) 10/22/2013 0450   LDLCALC 26 09/21/2021 1558   LABVLDL 50 (H) 09/21/2021 1558       Lab Results  Component Value Date   TSH 0.07 (A) 12/14/2022   TSH 0.17 (A) 08/30/2022   TSH 100.00 (A) 06/02/2022   TSH 122.00 (A) 05/23/2022    TSH 100.00 (A) 03/29/2022   TSH 174.548 (H) 03/14/2022   TSH 179.378 (H) 03/14/2022   TSH 100.00 (A) 03/14/2022   TSH 0.425 (L) 07/26/2021   TSH <0.005 (L) 04/25/2021   FREET4 <0.25 (L) 03/14/2022   FREET4 1.29 07/26/2021   FREET4 4.58 (H) 04/25/2021   FREET4 2.34 (H) 01/26/2021   FREET4 3.04 (H) 11/02/2020   FREET4 2.21 (H) 09/07/2020   FREET4 2.09 (H) 03/03/2020   FREET4 1.30 01/19/2020   FREET4 0.62 (L) 10/29/2019   FREET4 1.42 07/16/2019     Thyroid US from 03/14/22 CLINICAL DATA:  Hypothyroid. On thyroid hormone replacement therapy.   EXAM: THYROID ULTRASOUND   TECHNIQUE: Ultrasound examination of the thyroid gland and adjacent soft tissues was performed.   COMPARISON:  None.   FINDINGS: Parenchymal Echotexture: Markedly heterogenous   Isthmus: 0.1 cm   Right lobe: 4.1 x 1.8 x 0.9 cm   Left lobe: 4.3 x 1.0 x 1.0 cm   _________________________________________________________   Estimated total number of nodules >/= 1 cm: 0   Number of spongiform nodules >/=  2 cm not described below (TR1): 0   Number of mixed cystic and solid nodules >/=  1.5 cm not described below (TR2): 0   _________________________________________________________   No discrete nodules are seen within the thyroid gland.   IMPRESSION: Small heterogeneous thyroid gland. Imaging appearance is consistent with chronic thyroid hormone replacement therapy in the setting of hypothyroidism.   No thyroid nodules are visualized.     Electronically Signed   By: Jacqulynn Cadet M.D.   On: 03/14/2022 10:57   Latest Reference Range & Units 03/29/22 00:00 05/23/22 00:00 06/02/22 00:00 08/30/22 00:00 12/14/22 00:00  TSH 0.41 - 5.90  100.00 ! (E) 122.00 ! (C) (E) 100.00 ! (E) 0.17 ! (E) 0.07 ! (E)  !: Data is abnormal (C): Corrected (E): External lab result   Assessment & Plan:   ASSESSMENT / PLAN:  1. Hypothyroidism-acquired   Patient with long-standing hypothyroidism, on levothyroxine  therapy. On physical exam, patient  does not have gross goiter, thyroid nodules, or neck compression symptoms.  Her antibody testing was negative, ruling out autoimmune etiology.    Her previsit thyroid function tests are consistent with over-replacement.  Her primary provider already decreased her Levothyroxine to 100 mcg po daily shortly after her labs were completed. It has been nearly 2 months since the decrease in dosage, therefore will go ahead and check TSH and Free T4 now to see how she has responded.   - We discussed about correct intake of levothyroxine, at fasting, with water, separated by at least 30 minutes from breakfast, and separated by more than 4 hours from calcium, iron, multivitamins, acid reflux medications (PPIs). -Patient is made aware of the fact that thyroid hormone replacement is needed for life, dose to be adjusted by periodic monitoring of thyroid function tests.   -She recently had thyroid ultrasound on 03/14/22 showing small thyroid gland and no suspicious nodularities.     I spent  20  minutes in the care of the patient today including review of labs from Thyroid Function, CMP, and other relevant labs ; imaging/biopsy records (current and previous including abstractions from other facilities); face-to-face time discussing  her lab results and symptoms, medications doses, her options of short and long term treatment based on the latest standards of care / guidelines;   and documenting the encounter.  Marthe Patch  participated in the discussions, expressed understanding, and voiced agreement with the above plans.  All questions were answered to her satisfaction. she is encouraged to contact clinic should she have any questions or concerns prior to her return visit.   FOLLOW UP PLAN:  Return checking thyroid labs now and will base follow up needs off of results, for Previsit labs.  Rayetta Pigg, [facility] [address] [place], [place] [address] Phone: [phone] Fax: [phone]  02/02/2023, 10:16 AM

## 2023-02-05 DIAGNOSIS — M6281 Muscle weakness (generalized): Secondary | ICD-10-CM | POA: Diagnosis not present

## 2023-02-05 DIAGNOSIS — D649 Anemia, unspecified: Secondary | ICD-10-CM | POA: Diagnosis not present

## 2023-02-06 DIAGNOSIS — M6281 Muscle weakness (generalized): Secondary | ICD-10-CM | POA: Diagnosis not present

## 2023-02-06 DIAGNOSIS — D649 Anemia, unspecified: Secondary | ICD-10-CM | POA: Diagnosis not present

## 2023-02-07 DIAGNOSIS — D649 Anemia, unspecified: Secondary | ICD-10-CM | POA: Diagnosis not present

## 2023-02-07 DIAGNOSIS — M6281 Muscle weakness (generalized): Secondary | ICD-10-CM | POA: Diagnosis not present

## 2023-02-09 DIAGNOSIS — M6281 Muscle weakness (generalized): Secondary | ICD-10-CM | POA: Diagnosis not present

## 2023-02-09 DIAGNOSIS — E039 Hypothyroidism, unspecified: Secondary | ICD-10-CM | POA: Diagnosis not present

## 2023-02-09 DIAGNOSIS — D649 Anemia, unspecified: Secondary | ICD-10-CM | POA: Diagnosis not present

## 2023-02-10 DIAGNOSIS — M6281 Muscle weakness (generalized): Secondary | ICD-10-CM | POA: Diagnosis not present

## 2023-02-10 DIAGNOSIS — D649 Anemia, unspecified: Secondary | ICD-10-CM | POA: Diagnosis not present

## 2023-02-11 DIAGNOSIS — D649 Anemia, unspecified: Secondary | ICD-10-CM | POA: Diagnosis not present

## 2023-02-11 DIAGNOSIS — M6281 Muscle weakness (generalized): Secondary | ICD-10-CM | POA: Diagnosis not present

## 2023-02-12 DIAGNOSIS — D649 Anemia, unspecified: Secondary | ICD-10-CM | POA: Diagnosis not present

## 2023-02-12 DIAGNOSIS — M6281 Muscle weakness (generalized): Secondary | ICD-10-CM | POA: Diagnosis not present

## 2023-02-13 DIAGNOSIS — D649 Anemia, unspecified: Secondary | ICD-10-CM | POA: Diagnosis not present

## 2023-02-13 DIAGNOSIS — M6281 Muscle weakness (generalized): Secondary | ICD-10-CM | POA: Diagnosis not present

## 2023-02-14 ENCOUNTER — Telehealth: Payer: Self-pay | Admitting: Nurse Practitioner

## 2023-02-14 DIAGNOSIS — D649 Anemia, unspecified: Secondary | ICD-10-CM | POA: Diagnosis not present

## 2023-02-14 DIAGNOSIS — E039 Hypothyroidism, unspecified: Secondary | ICD-10-CM

## 2023-02-14 DIAGNOSIS — M6281 Muscle weakness (generalized): Secondary | ICD-10-CM | POA: Diagnosis not present

## 2023-02-14 NOTE — Telephone Encounter (Signed)
Please review labs under media. Thanks

## 2023-02-14 NOTE — Telephone Encounter (Signed)
I have made an appt and mailed that with the lab order to [facility] if someone could call over there and give the info below

## 2023-02-14 NOTE — Telephone Encounter (Signed)
Please let her facility know that her repeat labs look great!  Her TSH is still a little low but is typically the last to correct, but her Free T4 is now within a safe (and normal) range.  Have her continue the 100 mcg of Levothyroxine daily.  Lets repeat TSH and Free T4 and follow up in 3 months for surveillance.

## 2023-02-15 DIAGNOSIS — M6281 Muscle weakness (generalized): Secondary | ICD-10-CM | POA: Diagnosis not present

## 2023-02-15 DIAGNOSIS — F424 Excoriation (skin-picking) disorder: Secondary | ICD-10-CM | POA: Diagnosis not present

## 2023-02-15 DIAGNOSIS — D649 Anemia, unspecified: Secondary | ICD-10-CM | POA: Diagnosis not present

## 2023-02-19 DIAGNOSIS — D649 Anemia, unspecified: Secondary | ICD-10-CM | POA: Diagnosis not present

## 2023-02-19 DIAGNOSIS — M6281 Muscle weakness (generalized): Secondary | ICD-10-CM | POA: Diagnosis not present

## 2023-02-20 DIAGNOSIS — D649 Anemia, unspecified: Secondary | ICD-10-CM | POA: Diagnosis not present

## 2023-02-20 DIAGNOSIS — M6281 Muscle weakness (generalized): Secondary | ICD-10-CM | POA: Diagnosis not present

## 2023-02-21 DIAGNOSIS — M6281 Muscle weakness (generalized): Secondary | ICD-10-CM | POA: Diagnosis not present

## 2023-02-21 DIAGNOSIS — D649 Anemia, unspecified: Secondary | ICD-10-CM | POA: Diagnosis not present

## 2023-02-21 NOTE — Progress Notes (Deleted)
Synopsis: Referred for pulmonary nodule by Claretta Fraise, MD  Subjective:   PATIENT ID: [name] GENDER: female DOB: 08/13/1946, MRN: [id number]  No chief complaint on file.  76yF with history of etoh use, carotid stenosis, CKD2, TCP, smoking 60py quit 1993 referred for pulmonary nodule  Resident at [facility]. Was told she had irregular nodule on her lung, possibly on chest x ray. She is no no inhalers or breathing treatments. She has no cough. She has no DOE. No CP.   Otherwise pertinent review of systems is negative.  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 She has never lived outside of [place].  Past Medical History:  Diagnosis Date   Acute pyelonephritis 10/15/2019   Acute renal failure superimposed on stage 2 chronic kidney disease ([place]) 10/15/2019   Alcoholism ([place]) 01/09/2012   Anxiety    Arthritis    Left knee   B12 deficiency    Carotid artery narrowing 10/03/2012   On the right.   Chronic kidney disease (CKD), stage II (mild)    Depression    Headache(784.0)    Hyperlipidemia    Hypertension    Hyponatremia 01/09/2012   Hypothyroid    Incontinence of urine in female    Menopause    Mini stroke    Orthostatic syncope 10/02/2012   SBO (small bowel obstruction) ([place]) 06/28/2016   Stroke ([place]) 2010   no deficits   Thrombocytopenia ([place]) 10/15/2019     Family History  Problem Relation Age of Onset   GER disease Mother    Ulcers Mother    Hypertension Maternal Grandmother      Past Surgical History:  Procedure Laterality Date   AGILE CAPSULE N/A 02/22/2016   dummy capsule remained in the distal ileum   APPENDECTOMY     CATARACT EXTRACTION W/PHACO  06/20/2012   Procedure: CATARACT EXTRACTION PHACO AND INTRAOCULAR LENS PLACEMENT ([place]);  Surgeon: Tonny Branch, MD;  Location: [facility];  Service: Ophthalmology;  Laterality: Right;  CDE:10.66   CATARACT EXTRACTION W/PHACO   07/11/2012   Procedure: CATARACT EXTRACTION PHACO AND INTRAOCULAR LENS PLACEMENT ([facility]);  Surgeon: Tonny Branch, MD;  Location: [facility];  Service: Ophthalmology;  Laterality: Left;  CDE: 12.69   COLONOSCOPY N/A 12/17/2015   RMR: Marked circumferential ulceration of the ascending colon/Ileocecal valve ulceration with biopsy most consistent with NSAID injury versus inflammatory bowel disease, pancolonoc diverticulosis redundant colon   ESOPHAGOGASTRODUODENOSCOPY N/A 12/17/2015   [id number] gastric polyp, biopsy benign   KNEE ARTHROSCOPY Right 09/2016   LUMBAR LAMINECTOMY/DECOMPRESSION MICRODISCECTOMY N/A 05/25/2017   Procedure: CENTRAL DECOMPRESSIVE LUMBAR LAMINECTOMY FOR SPINAL STENOSIS L3-L4, FORAMINOTOMY FOR THE L4 ROOT AND L5 ROOT;  Surgeon: Latanya Maudlin, MD;  Location: [facility];  Service: Orthopedics;  Laterality: N/A;   TONSILLECTOMY     TOTAL KNEE ARTHROPLASTY Left 10/31/2021   Procedure: TOTAL KNEE ARTHROPLASTY;  Surgeon: Gaynelle Arabian, MD;  Location: [facility];  Service: Orthopedics;  Laterality: Left;    Social History   Socioeconomic History   Marital status: Married    Spouse name: Maricela Curet"   Number of children: 2   Years of education: 14   Highest education level: Some college, no degree  Occupational History   Occupation: Psychologist, educational: [employer]    Comment: Retired  Tobacco Use   Smoking status: Former    Packs/day: 3.00    Years: 30.00  Total pack years: 90.00    Types: Cigarettes    Quit date: 06/17/1992    Years since quitting: 30.7   Smokeless tobacco: Never   Tobacco comments:    3 packs a day x 30 years former smoker.  Not currently smoking  Vaping Use   Vaping Use: Never used  Substance and Sexual Activity   Alcohol use: No   Drug use: No   Sexual activity: Not Currently  Other Topics Concern   Not on file  Social History Narrative   Lives on one level with her husband, Laruth Bouchard lives 3 streets over   Social Determinants  of Health   Financial Resource Strain: Low Risk  (11/28/2021)   Overall Financial Resource Strain (CARDIA)    Difficulty of Paying Living Expenses: Not hard at all  Food Insecurity: No Food Insecurity (11/28/2021)   Hunger Vital Sign    Worried About Running Out of Food in the Last Year: Never true    Ran Out of Food in the Last Year: Never true  Transportation Needs: No Transportation Needs (11/28/2021)   PRAPARE - Hydrologist (Medical): No    Lack of Transportation (Non-Medical): No  Physical Activity: Insufficiently Active (11/28/2021)   Exercise Vital Sign    Days of Exercise per Week: 7 days    Minutes of Exercise per Session: 10 min  Stress: No Stress Concern Present (11/28/2021)   [place]    Feeling of Stress : Not at all  Social Connections: Moderately Isolated (11/28/2021)   Social Connection and Isolation Panel [NHANES]    Frequency of Communication with Friends and Family: More than three times a week    Frequency of Social Gatherings with Friends and Family: More than three times a week    Attends Religious Services: Never    Marine scientist or Organizations: No    Attends Archivist Meetings: Never    Marital Status: Married  Human resources officer Violence: Not At Risk (11/28/2021)   Humiliation, Afraid, Rape, and Kick questionnaire    Fear of Current or Ex-Partner: No    Emotionally Abused: No    Physically Abused: No    Sexually Abused: No     Allergies  Allergen Reactions   Ciprofloxacin Other (See Comments)    Caused pt to have delusional thoughts and hallucinations   Procardia [Nifedipine] Swelling    Edema     Ativan [Lorazepam]    Norvasc [Amlodipine Besylate] Hives     Outpatient Medications Prior to Visit  Medication Sig Dispense Refill   acetaminophen (TYLENOL) 500 MG tablet Take 1,000 mg by mouth every 12 (twelve) hours as needed.      atorvastatin (LIPITOR) 20 MG tablet Take 20 mg by mouth daily.     feeding supplement (ENSURE ENLIVE / ENSURE PLUS) LIQD Take 237 mLs by mouth 2 (two) times daily between meals. (Patient not taking: Reported on 01/24/2023) 237 mL 12   ferrous sulfate 325 (65 FE) MG tablet Take 325 mg by mouth daily with breakfast.     levothyroxine (SYNTHROID) 100 MCG tablet Take 100 mcg by mouth daily before breakfast.     Menthol-Zinc Oxide 0.44-20.625 % OINT Apply topically.     metoprolol succinate (TOPROL-XL) 50 MG 24 hr tablet Take 1 tablet (50 mg total) by mouth daily. Take with or immediately following a meal. 30 tablet 1   Multiple Vitamin (MULTIVITAMIN) tablet  Take 1 tablet by mouth daily.     Multiple Vitamins-Minerals (OCUVITE ADULT 50+) CAPS Take by mouth.     Omega 3 1000 MG CAPS Take by mouth daily.     ondansetron (ZOFRAN) 8 MG tablet Take by mouth every 8 (eight) hours as needed for nausea or vomiting.     saccharomyces boulardii (FLORASTOR) 250 MG capsule Take 1 capsule (250 mg total) by mouth 2 (two) times daily. 60 capsule 0   vitamin B-12 (CYANOCOBALAMIN) 500 MCG tablet Take 500 mcg by mouth daily.     vitamin C (ASCORBIC ACID) 500 MG tablet Take 500 mg by mouth daily.     No facility-administered medications prior to visit.       Objective:   Physical Exam:  General appearance: 77 y.o., female, NAD, conversant  Eyes: anicteric sclerae; PERRL, tracking appropriately HENT: NCAT; MMM Neck: Trachea midline; no lymphadenopathy, no JVD Lungs: CTAB, no crackles, no wheeze, with normal respiratory effort CV: RRR, no murmur  Abdomen: Soft, non-tender; non-distended, BS present  Extremities: No peripheral edema, warm Skin: Normal turgor and texture; no rash Psych: Appropriate affect Neuro: Alert and oriented to person and place, no focal deficit     There were no vitals filed for this visit.    on RA BMI Readings from Last 3 Encounters:  02/02/23 19.49 kg/m  01/24/23 19.49 kg/m   09/21/22 19.77 kg/m   Wt Readings from Last 3 Encounters:  01/24/23 132 lb (59.9 kg)  03/13/22 130 lb (59 kg)  03/03/22 130 lb (59 kg)     CBC    Component Value Date/Time   WBC 6.2 03/17/2022 0515   RBC 2.66 (L) 03/17/2022 0515   HGB 8.3 (L) 03/17/2022 0515   HGB 13.1 09/21/2021 1558   HCT 24.8 (L) 03/17/2022 0515   HCT 38.2 09/21/2021 1558   PLT 262 03/17/2022 0515   PLT 203 09/21/2021 1558   MCV 93.2 03/17/2022 0515   MCV 96 09/21/2021 1558   MCH 31.2 03/17/2022 0515   MCHC 33.5 03/17/2022 0515   RDW 15.9 (H) 03/17/2022 0515   RDW 12.2 09/21/2021 1558   LYMPHSABS 0.8 03/13/2022 1521   LYMPHSABS 2.7 09/21/2021 1558   MONOABS 0.2 03/13/2022 1521   EOSABS 0.0 03/13/2022 1521   EOSABS 0.0 09/21/2021 1558   BASOSABS 0.0 03/13/2022 1521   BASOSABS 0.0 09/21/2021 1558      Chest Imaging: None available  Pulmonary Functions Testing Results:     No data to display             Assessment & Plan:   # Reported pulmonary nodule # 60 py smoking  Plan: - you'll be called to schedule CT Chest - I'll see you tentatively in 4 weeks to discuss results     Maryjane Hurter, MD [facility] 02/21/2023 6:52 PM

## 2023-02-22 ENCOUNTER — Ambulatory Visit: Payer: Medicare HMO | Admitting: Student

## 2023-02-22 DIAGNOSIS — D649 Anemia, unspecified: Secondary | ICD-10-CM | POA: Diagnosis not present

## 2023-02-22 DIAGNOSIS — M6281 Muscle weakness (generalized): Secondary | ICD-10-CM | POA: Diagnosis not present

## 2023-02-28 DIAGNOSIS — R911 Solitary pulmonary nodule: Secondary | ICD-10-CM | POA: Diagnosis not present

## 2023-02-28 DIAGNOSIS — D649 Anemia, unspecified: Secondary | ICD-10-CM | POA: Diagnosis not present

## 2023-02-28 DIAGNOSIS — E538 Deficiency of other specified B group vitamins: Secondary | ICD-10-CM | POA: Diagnosis not present

## 2023-02-28 DIAGNOSIS — E785 Hyperlipidemia, unspecified: Secondary | ICD-10-CM | POA: Diagnosis not present

## 2023-02-28 DIAGNOSIS — I10 Essential (primary) hypertension: Secondary | ICD-10-CM | POA: Diagnosis not present

## 2023-02-28 DIAGNOSIS — N189 Chronic kidney disease, unspecified: Secondary | ICD-10-CM | POA: Diagnosis not present

## 2023-02-28 DIAGNOSIS — E039 Hypothyroidism, unspecified: Secondary | ICD-10-CM | POA: Diagnosis not present

## 2023-02-28 DIAGNOSIS — Z8673 Personal history of transient ischemic attack (TIA), and cerebral infarction without residual deficits: Secondary | ICD-10-CM | POA: Diagnosis not present

## 2023-03-20 DIAGNOSIS — M21622 Bunionette of left foot: Secondary | ICD-10-CM | POA: Diagnosis not present

## 2023-03-20 DIAGNOSIS — B351 Tinea unguium: Secondary | ICD-10-CM | POA: Diagnosis not present

## 2023-03-20 DIAGNOSIS — I739 Peripheral vascular disease, unspecified: Secondary | ICD-10-CM | POA: Diagnosis not present

## 2023-03-20 DIAGNOSIS — L603 Nail dystrophy: Secondary | ICD-10-CM | POA: Diagnosis not present

## 2023-03-20 DIAGNOSIS — M21621 Bunionette of right foot: Secondary | ICD-10-CM | POA: Diagnosis not present

## 2023-03-21 ENCOUNTER — Ambulatory Visit (HOSPITAL_COMMUNITY)
Admission: RE | Admit: 2023-03-21 | Discharge: 2023-03-21 | Disposition: A | Discharge status: Home / Self Care | Payer: Medicare HMO | Source: Ambulatory Visit | Attending: Student | Admitting: Student

## 2023-03-21 DIAGNOSIS — R911 Solitary pulmonary nodule: Secondary | ICD-10-CM | POA: Diagnosis not present

## 2023-03-21 DIAGNOSIS — D649 Anemia, unspecified: Secondary | ICD-10-CM | POA: Diagnosis not present

## 2023-03-21 DIAGNOSIS — R2689 Other abnormalities of gait and mobility: Secondary | ICD-10-CM | POA: Diagnosis not present

## 2023-03-21 DIAGNOSIS — R918 Other nonspecific abnormal finding of lung field: Secondary | ICD-10-CM | POA: Diagnosis not present

## 2023-03-21 DIAGNOSIS — R262 Difficulty in walking, not elsewhere classified: Secondary | ICD-10-CM | POA: Diagnosis not present

## 2023-03-21 DIAGNOSIS — R488 Other symbolic dysfunctions: Secondary | ICD-10-CM | POA: Diagnosis not present

## 2023-03-21 DIAGNOSIS — M6281 Muscle weakness (generalized): Secondary | ICD-10-CM | POA: Diagnosis not present

## 2023-03-22 DIAGNOSIS — E782 Mixed hyperlipidemia: Secondary | ICD-10-CM | POA: Diagnosis not present

## 2023-03-22 DIAGNOSIS — D649 Anemia, unspecified: Secondary | ICD-10-CM | POA: Diagnosis not present

## 2023-03-22 DIAGNOSIS — R488 Other symbolic dysfunctions: Secondary | ICD-10-CM | POA: Diagnosis not present

## 2023-03-22 DIAGNOSIS — R2689 Other abnormalities of gait and mobility: Secondary | ICD-10-CM | POA: Diagnosis not present

## 2023-03-22 DIAGNOSIS — R262 Difficulty in walking, not elsewhere classified: Secondary | ICD-10-CM | POA: Diagnosis not present

## 2023-03-22 DIAGNOSIS — M6281 Muscle weakness (generalized): Secondary | ICD-10-CM | POA: Diagnosis not present

## 2023-03-23 DIAGNOSIS — R2689 Other abnormalities of gait and mobility: Secondary | ICD-10-CM | POA: Diagnosis not present

## 2023-03-23 DIAGNOSIS — D649 Anemia, unspecified: Secondary | ICD-10-CM | POA: Diagnosis not present

## 2023-03-23 DIAGNOSIS — M6281 Muscle weakness (generalized): Secondary | ICD-10-CM | POA: Diagnosis not present

## 2023-03-23 DIAGNOSIS — R262 Difficulty in walking, not elsewhere classified: Secondary | ICD-10-CM | POA: Diagnosis not present

## 2023-03-23 DIAGNOSIS — R488 Other symbolic dysfunctions: Secondary | ICD-10-CM | POA: Diagnosis not present

## 2023-03-26 DIAGNOSIS — R488 Other symbolic dysfunctions: Secondary | ICD-10-CM | POA: Diagnosis not present

## 2023-03-26 DIAGNOSIS — R262 Difficulty in walking, not elsewhere classified: Secondary | ICD-10-CM | POA: Diagnosis not present

## 2023-03-26 DIAGNOSIS — M6281 Muscle weakness (generalized): Secondary | ICD-10-CM | POA: Diagnosis not present

## 2023-03-26 DIAGNOSIS — R2689 Other abnormalities of gait and mobility: Secondary | ICD-10-CM | POA: Diagnosis not present

## 2023-03-26 DIAGNOSIS — D649 Anemia, unspecified: Secondary | ICD-10-CM | POA: Diagnosis not present

## 2023-03-26 NOTE — Progress Notes (Unsigned)
Synopsis: Referred for pulmonary nodule by Mechele Claude, MD  Subjective:   PATIENT ID: [name] GENDER: female DOB: 1946/06/04, MRN: [id number]  No chief complaint on file.  76yF with history of etoh use, carotid stenosis, CKD2, TCP, smoking 60py quit 1993 referred for pulmonary nodule  Resident at [facility]. Was told she had irregular nodule on her lung, possibly on chest x ray. She is no no inhalers or breathing treatments. She has no cough. She has no DOE. No CP.   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 She has never lived outside of [place].  Interval HPI  Apical scarring, nodular component R apex   Otherwise pertinent review of systems is negative.   Past Medical History:  Diagnosis Date   Acute pyelonephritis 10/15/2019   Acute renal failure superimposed on stage 2 chronic kidney disease ([facility]) 10/15/2019   Alcoholism ([facility]) 01/09/2012   Anxiety    Arthritis    Left knee   B12 deficiency    Carotid artery narrowing 10/03/2012   On the right.   Chronic kidney disease (CKD), stage II (mild)    Depression    Headache(784.0)    Hyperlipidemia    Hypertension    Hyponatremia 01/09/2012   Hypothyroid    Incontinence of urine in female    Menopause    Mini stroke    Orthostatic syncope 10/02/2012   SBO (small bowel obstruction) ([facility]) 06/28/2016   Stroke ([facility]) 2010   no deficits   Thrombocytopenia ([facility]) 10/15/2019     Family History  Problem Relation Age of Onset   GER disease Mother    Ulcers Mother    Hypertension Maternal Grandmother      Past Surgical History:  Procedure Laterality Date   AGILE CAPSULE N/A 02/22/2016   dummy capsule remained in the distal ileum   APPENDECTOMY     CATARACT EXTRACTION W/PHACO  06/20/2012   Procedure: CATARACT EXTRACTION PHACO AND INTRAOCULAR LENS PLACEMENT ([facility]);  Surgeon: Gemma Payor, MD;  Location: [facility];  Service: Ophthalmology;   Laterality: Right;  CDE:10.66   CATARACT EXTRACTION W/PHACO  07/11/2012   Procedure: CATARACT EXTRACTION PHACO AND INTRAOCULAR LENS PLACEMENT ([facility]);  Surgeon: Gemma Payor, MD;  Location: [facility];  Service: Ophthalmology;  Laterality: Left;  CDE: 12.69   COLONOSCOPY N/A 12/17/2015   RMR: Marked circumferential ulceration of the ascending colon/Ileocecal valve ulceration with biopsy most consistent with NSAID injury versus inflammatory bowel disease, pancolonoc diverticulosis redundant colon   ESOPHAGOGASTRODUODENOSCOPY N/A 12/17/2015   [id number] gastric polyp, biopsy benign   KNEE ARTHROSCOPY Right 09/2016   LUMBAR LAMINECTOMY/DECOMPRESSION MICRODISCECTOMY N/A 05/25/2017   Procedure: CENTRAL DECOMPRESSIVE LUMBAR LAMINECTOMY FOR SPINAL STENOSIS L3-L4, FORAMINOTOMY FOR THE L4 ROOT AND L5 ROOT;  Surgeon: Ranee Gosselin, MD;  Location: [facility];  Service: Orthopedics;  Laterality: N/A;   TONSILLECTOMY     TOTAL KNEE ARTHROPLASTY Left 10/31/2021   Procedure: TOTAL KNEE ARTHROPLASTY;  Surgeon: Ollen Gross, MD;  Location: [facility];  Service: Orthopedics;  Laterality: Left;    Social History   Socioeconomic History   Marital status: Married    Spouse name: Beaulah Dinning"   Number of children: 2   Years of education: 14   Highest education level: Some college, no degree  Occupational History   Occupation: Investment banker, operational: [employer]    Comment: Retired  Tobacco Use   Smoking status:  Former    Packs/day: 3.00    Years: 30.00    Additional pack years: 0.00    Total pack years: 90.00    Types: Cigarettes    Quit date: 06/17/1992    Years since quitting: 30.7   Smokeless tobacco: Never   Tobacco comments:    3 packs a day x 30 years former smoker.  Not currently smoking  Vaping Use   Vaping Use: Never used  Substance and Sexual Activity   Alcohol use: No   Drug use: No   Sexual activity: Not Currently  Other Topics Concern   Not on file  Social History Narrative    Lives on one level with her husband, [name] lives 3 streets over   Social Determinants of Health   Financial Resource Strain: Low Risk  (11/28/2021)   Overall Financial Resource Strain (CARDIA)    Difficulty of Paying Living Expenses: Not hard at all  Food Insecurity: No Food Insecurity (11/28/2021)   Hunger Vital Sign    Worried About Running Out of Food in the Last Year: Never true    Ran Out of Food in the Last Year: Never true  Transportation Needs: No Transportation Needs (11/28/2021)   PRAPARE - Administrator, Civil Service (Medical): No    Lack of Transportation (Non-Medical): No  Physical Activity: Insufficiently Active (11/28/2021)   Exercise Vital Sign    Days of Exercise per Week: 7 days    Minutes of Exercise per Session: 10 min  Stress: No Stress Concern Present (11/28/2021)   [employer] of Occupational Health - Occupational Stress Questionnaire    Feeling of Stress : Not at all  Social Connections: Moderately Isolated (11/28/2021)   Social Connection and Isolation Panel [NHANES]    Frequency of Communication with Friends and Family: More than three times a week    Frequency of Social Gatherings with Friends and Family: More than three times a week    Attends Religious Services: Never    Database administrator or Organizations: No    Attends Banker Meetings: Never    Marital Status: Married  Catering manager Violence: Not At Risk (11/28/2021)   Humiliation, Afraid, Rape, and Kick questionnaire    Fear of Current or Ex-Partner: No    Emotionally Abused: No    Physically Abused: No    Sexually Abused: No     Allergies  Allergen Reactions   Ciprofloxacin Other (See Comments)    Caused pt to have delusional thoughts and hallucinations   Procardia [Nifedipine] Swelling    Edema     Ativan [Lorazepam]    Norvasc [Amlodipine Besylate] Hives     Outpatient Medications Prior to Visit  Medication Sig Dispense Refill    acetaminophen (TYLENOL) 500 MG tablet Take 1,000 mg by mouth every 12 (twelve) hours as needed.     atorvastatin (LIPITOR) 20 MG tablet Take 20 mg by mouth daily.     feeding supplement (ENSURE ENLIVE / ENSURE PLUS) LIQD Take 237 mLs by mouth 2 (two) times daily between meals. (Patient not taking: Reported on 01/24/2023) 237 mL 12   ferrous sulfate 325 (65 FE) MG tablet Take 325 mg by mouth daily with breakfast.     levothyroxine (SYNTHROID) 100 MCG tablet Take 100 mcg by mouth daily before breakfast.     Menthol-Zinc Oxide 0.44-20.625 % OINT Apply topically.     metoprolol succinate (TOPROL-XL) 50 MG 24 hr tablet Take 1 tablet (50  mg total) by mouth daily. Take with or immediately following a meal. 30 tablet 1   Multiple Vitamin (MULTIVITAMIN) tablet Take 1 tablet by mouth daily.     Multiple Vitamins-Minerals (OCUVITE ADULT 50+) CAPS Take by mouth.     Omega 3 1000 MG CAPS Take by mouth daily.     ondansetron (ZOFRAN) 8 MG tablet Take by mouth every 8 (eight) hours as needed for nausea or vomiting.     saccharomyces boulardii (FLORASTOR) 250 MG capsule Take 1 capsule (250 mg total) by mouth 2 (two) times daily. 60 capsule 0   vitamin B-12 (CYANOCOBALAMIN) 500 MCG tablet Take 500 mcg by mouth daily.     vitamin C (ASCORBIC ACID) 500 MG tablet Take 500 mg by mouth daily.     No facility-administered medications prior to visit.       Objective:   Physical Exam:  General appearance: 77 y.o., female, NAD, conversant  Eyes: anicteric sclerae; PERRL, tracking appropriately HENT: NCAT; MMM Neck: Trachea midline; no lymphadenopathy, no JVD Lungs: CTAB, no crackles, no wheeze, with normal respiratory effort CV: RRR, no murmur  Abdomen: Soft, non-tender; non-distended, BS present  Extremities: No peripheral edema, warm Skin: Normal turgor and texture; no rash Psych: Appropriate affect Neuro: Alert and oriented to person and place, no focal deficit     There were no vitals filed for this  visit.    on RA BMI Readings from Last 3 Encounters:  02/02/23 19.49 kg/m  01/24/23 19.49 kg/m  09/21/22 19.77 kg/m   Wt Readings from Last 3 Encounters:  01/24/23 132 lb (59.9 kg)  03/13/22 130 lb (59 kg)  03/03/22 130 lb (59 kg)     CBC    Component Value Date/Time   WBC 6.2 03/17/2022 0515   RBC 2.66 (L) 03/17/2022 0515   HGB 8.3 (L) 03/17/2022 0515   HGB 13.1 09/21/2021 1558   HCT 24.8 (L) 03/17/2022 0515   HCT 38.2 09/21/2021 1558   PLT 262 03/17/2022 0515   PLT 203 09/21/2021 1558   MCV 93.2 03/17/2022 0515   MCV 96 09/21/2021 1558   MCH 31.2 03/17/2022 0515   MCHC 33.5 03/17/2022 0515   RDW 15.9 (H) 03/17/2022 0515   RDW 12.2 09/21/2021 1558   LYMPHSABS 0.8 03/13/2022 1521   LYMPHSABS 2.7 09/21/2021 1558   MONOABS 0.2 03/13/2022 1521   EOSABS 0.0 03/13/2022 1521   EOSABS 0.0 09/21/2021 1558   BASOSABS 0.0 03/13/2022 1521   BASOSABS 0.0 09/21/2021 1558      Chest Imaging: CT Super D 03/21/23 apical scarring with inferior nodular component R apex, bilateral small stable pulmonary nodules  Pulmonary Functions Testing Results:     No data to display             Assessment & Plan:   # Reported pulmonary nodule # 60 py smoking  Plan: - you'll be called to schedule CT Chest - I'll see you tentatively in 4 weeks to discuss results     Omar Person, MD [facility] 03/26/2023 4:51 PM

## 2023-03-27 DIAGNOSIS — R262 Difficulty in walking, not elsewhere classified: Secondary | ICD-10-CM | POA: Diagnosis not present

## 2023-03-27 DIAGNOSIS — M6281 Muscle weakness (generalized): Secondary | ICD-10-CM | POA: Diagnosis not present

## 2023-03-27 DIAGNOSIS — D649 Anemia, unspecified: Secondary | ICD-10-CM | POA: Diagnosis not present

## 2023-03-27 DIAGNOSIS — R2689 Other abnormalities of gait and mobility: Secondary | ICD-10-CM | POA: Diagnosis not present

## 2023-03-27 DIAGNOSIS — R488 Other symbolic dysfunctions: Secondary | ICD-10-CM | POA: Diagnosis not present

## 2023-03-28 ENCOUNTER — Encounter: Payer: Self-pay | Admitting: Student

## 2023-03-28 ENCOUNTER — Ambulatory Visit (INDEPENDENT_AMBULATORY_CARE_PROVIDER_SITE_OTHER): Payer: Medicare HMO | Admitting: Student

## 2023-03-28 VITALS — BP 118/72 | HR 84 | Ht 66.5 in | Wt 137.4 lb

## 2023-03-28 DIAGNOSIS — M6281 Muscle weakness (generalized): Secondary | ICD-10-CM | POA: Diagnosis not present

## 2023-03-28 DIAGNOSIS — R911 Solitary pulmonary nodule: Secondary | ICD-10-CM | POA: Diagnosis not present

## 2023-03-28 DIAGNOSIS — R488 Other symbolic dysfunctions: Secondary | ICD-10-CM | POA: Diagnosis not present

## 2023-03-28 DIAGNOSIS — D649 Anemia, unspecified: Secondary | ICD-10-CM | POA: Diagnosis not present

## 2023-03-28 DIAGNOSIS — R2689 Other abnormalities of gait and mobility: Secondary | ICD-10-CM | POA: Diagnosis not present

## 2023-03-28 DIAGNOSIS — R262 Difficulty in walking, not elsewhere classified: Secondary | ICD-10-CM | POA: Diagnosis not present

## 2023-03-28 NOTE — Patient Instructions (Signed)
-   CT Chest in 6 months and clinic visit in 7 months

## 2023-03-29 DIAGNOSIS — R488 Other symbolic dysfunctions: Secondary | ICD-10-CM | POA: Diagnosis not present

## 2023-03-29 DIAGNOSIS — R2689 Other abnormalities of gait and mobility: Secondary | ICD-10-CM | POA: Diagnosis not present

## 2023-03-29 DIAGNOSIS — M6281 Muscle weakness (generalized): Secondary | ICD-10-CM | POA: Diagnosis not present

## 2023-03-29 DIAGNOSIS — R262 Difficulty in walking, not elsewhere classified: Secondary | ICD-10-CM | POA: Diagnosis not present

## 2023-03-29 DIAGNOSIS — D649 Anemia, unspecified: Secondary | ICD-10-CM | POA: Diagnosis not present

## 2023-03-30 DIAGNOSIS — R309 Painful micturition, unspecified: Secondary | ICD-10-CM | POA: Diagnosis not present

## 2023-03-30 DIAGNOSIS — R3915 Urgency of urination: Secondary | ICD-10-CM | POA: Diagnosis not present

## 2023-04-02 DIAGNOSIS — M6281 Muscle weakness (generalized): Secondary | ICD-10-CM | POA: Diagnosis not present

## 2023-04-02 DIAGNOSIS — Z79899 Other long term (current) drug therapy: Secondary | ICD-10-CM | POA: Diagnosis not present

## 2023-04-02 DIAGNOSIS — D649 Anemia, unspecified: Secondary | ICD-10-CM | POA: Diagnosis not present

## 2023-04-02 DIAGNOSIS — R262 Difficulty in walking, not elsewhere classified: Secondary | ICD-10-CM | POA: Diagnosis not present

## 2023-04-02 DIAGNOSIS — N390 Urinary tract infection, site not specified: Secondary | ICD-10-CM | POA: Diagnosis not present

## 2023-04-02 DIAGNOSIS — R488 Other symbolic dysfunctions: Secondary | ICD-10-CM | POA: Diagnosis not present

## 2023-04-02 DIAGNOSIS — R2689 Other abnormalities of gait and mobility: Secondary | ICD-10-CM | POA: Diagnosis not present

## 2023-04-03 DIAGNOSIS — R262 Difficulty in walking, not elsewhere classified: Secondary | ICD-10-CM | POA: Diagnosis not present

## 2023-04-03 DIAGNOSIS — D649 Anemia, unspecified: Secondary | ICD-10-CM | POA: Diagnosis not present

## 2023-04-03 DIAGNOSIS — R2689 Other abnormalities of gait and mobility: Secondary | ICD-10-CM | POA: Diagnosis not present

## 2023-04-03 DIAGNOSIS — M6281 Muscle weakness (generalized): Secondary | ICD-10-CM | POA: Diagnosis not present

## 2023-04-03 DIAGNOSIS — R488 Other symbolic dysfunctions: Secondary | ICD-10-CM | POA: Diagnosis not present

## 2023-04-04 DIAGNOSIS — R262 Difficulty in walking, not elsewhere classified: Secondary | ICD-10-CM | POA: Diagnosis not present

## 2023-04-04 DIAGNOSIS — R2689 Other abnormalities of gait and mobility: Secondary | ICD-10-CM | POA: Diagnosis not present

## 2023-04-04 DIAGNOSIS — M6281 Muscle weakness (generalized): Secondary | ICD-10-CM | POA: Diagnosis not present

## 2023-04-04 DIAGNOSIS — R488 Other symbolic dysfunctions: Secondary | ICD-10-CM | POA: Diagnosis not present

## 2023-04-04 DIAGNOSIS — D649 Anemia, unspecified: Secondary | ICD-10-CM | POA: Diagnosis not present

## 2023-04-05 DIAGNOSIS — M6281 Muscle weakness (generalized): Secondary | ICD-10-CM | POA: Diagnosis not present

## 2023-04-05 DIAGNOSIS — R488 Other symbolic dysfunctions: Secondary | ICD-10-CM | POA: Diagnosis not present

## 2023-04-05 DIAGNOSIS — I10 Essential (primary) hypertension: Secondary | ICD-10-CM | POA: Diagnosis not present

## 2023-04-05 DIAGNOSIS — I739 Peripheral vascular disease, unspecified: Secondary | ICD-10-CM | POA: Diagnosis not present

## 2023-04-05 DIAGNOSIS — D649 Anemia, unspecified: Secondary | ICD-10-CM | POA: Diagnosis not present

## 2023-04-05 DIAGNOSIS — R2689 Other abnormalities of gait and mobility: Secondary | ICD-10-CM | POA: Diagnosis not present

## 2023-04-05 DIAGNOSIS — N189 Chronic kidney disease, unspecified: Secondary | ICD-10-CM | POA: Diagnosis not present

## 2023-04-05 DIAGNOSIS — R262 Difficulty in walking, not elsewhere classified: Secondary | ICD-10-CM | POA: Diagnosis not present

## 2023-04-05 DIAGNOSIS — E039 Hypothyroidism, unspecified: Secondary | ICD-10-CM | POA: Diagnosis not present

## 2023-04-05 DIAGNOSIS — Z79899 Other long term (current) drug therapy: Secondary | ICD-10-CM | POA: Diagnosis not present

## 2023-04-10 DIAGNOSIS — M6281 Muscle weakness (generalized): Secondary | ICD-10-CM | POA: Diagnosis not present

## 2023-04-10 DIAGNOSIS — R262 Difficulty in walking, not elsewhere classified: Secondary | ICD-10-CM | POA: Diagnosis not present

## 2023-04-10 DIAGNOSIS — R488 Other symbolic dysfunctions: Secondary | ICD-10-CM | POA: Diagnosis not present

## 2023-04-10 DIAGNOSIS — R2689 Other abnormalities of gait and mobility: Secondary | ICD-10-CM | POA: Diagnosis not present

## 2023-04-10 DIAGNOSIS — D649 Anemia, unspecified: Secondary | ICD-10-CM | POA: Diagnosis not present

## 2023-04-11 DIAGNOSIS — D649 Anemia, unspecified: Secondary | ICD-10-CM | POA: Diagnosis not present

## 2023-04-11 DIAGNOSIS — R262 Difficulty in walking, not elsewhere classified: Secondary | ICD-10-CM | POA: Diagnosis not present

## 2023-04-11 DIAGNOSIS — R488 Other symbolic dysfunctions: Secondary | ICD-10-CM | POA: Diagnosis not present

## 2023-04-11 DIAGNOSIS — R2689 Other abnormalities of gait and mobility: Secondary | ICD-10-CM | POA: Diagnosis not present

## 2023-04-11 DIAGNOSIS — M6281 Muscle weakness (generalized): Secondary | ICD-10-CM | POA: Diagnosis not present

## 2023-04-12 DIAGNOSIS — R2689 Other abnormalities of gait and mobility: Secondary | ICD-10-CM | POA: Diagnosis not present

## 2023-04-12 DIAGNOSIS — D649 Anemia, unspecified: Secondary | ICD-10-CM | POA: Diagnosis not present

## 2023-04-12 DIAGNOSIS — M6281 Muscle weakness (generalized): Secondary | ICD-10-CM | POA: Diagnosis not present

## 2023-04-12 DIAGNOSIS — R488 Other symbolic dysfunctions: Secondary | ICD-10-CM | POA: Diagnosis not present

## 2023-04-12 DIAGNOSIS — R262 Difficulty in walking, not elsewhere classified: Secondary | ICD-10-CM | POA: Diagnosis not present

## 2023-04-13 DIAGNOSIS — R488 Other symbolic dysfunctions: Secondary | ICD-10-CM | POA: Diagnosis not present

## 2023-04-13 DIAGNOSIS — D649 Anemia, unspecified: Secondary | ICD-10-CM | POA: Diagnosis not present

## 2023-04-13 DIAGNOSIS — M6281 Muscle weakness (generalized): Secondary | ICD-10-CM | POA: Diagnosis not present

## 2023-04-13 DIAGNOSIS — R262 Difficulty in walking, not elsewhere classified: Secondary | ICD-10-CM | POA: Diagnosis not present

## 2023-04-13 DIAGNOSIS — R2689 Other abnormalities of gait and mobility: Secondary | ICD-10-CM | POA: Diagnosis not present

## 2023-04-16 DIAGNOSIS — R488 Other symbolic dysfunctions: Secondary | ICD-10-CM | POA: Diagnosis not present

## 2023-04-16 DIAGNOSIS — M6281 Muscle weakness (generalized): Secondary | ICD-10-CM | POA: Diagnosis not present

## 2023-04-16 DIAGNOSIS — R2689 Other abnormalities of gait and mobility: Secondary | ICD-10-CM | POA: Diagnosis not present

## 2023-04-16 DIAGNOSIS — R262 Difficulty in walking, not elsewhere classified: Secondary | ICD-10-CM | POA: Diagnosis not present

## 2023-04-16 DIAGNOSIS — D649 Anemia, unspecified: Secondary | ICD-10-CM | POA: Diagnosis not present

## 2023-04-17 DIAGNOSIS — R262 Difficulty in walking, not elsewhere classified: Secondary | ICD-10-CM | POA: Diagnosis not present

## 2023-04-17 DIAGNOSIS — M6281 Muscle weakness (generalized): Secondary | ICD-10-CM | POA: Diagnosis not present

## 2023-04-17 DIAGNOSIS — D649 Anemia, unspecified: Secondary | ICD-10-CM | POA: Diagnosis not present

## 2023-04-17 DIAGNOSIS — R488 Other symbolic dysfunctions: Secondary | ICD-10-CM | POA: Diagnosis not present

## 2023-04-17 DIAGNOSIS — R2689 Other abnormalities of gait and mobility: Secondary | ICD-10-CM | POA: Diagnosis not present

## 2023-04-18 DIAGNOSIS — D649 Anemia, unspecified: Secondary | ICD-10-CM | POA: Diagnosis not present

## 2023-04-18 DIAGNOSIS — R2689 Other abnormalities of gait and mobility: Secondary | ICD-10-CM | POA: Diagnosis not present

## 2023-04-18 DIAGNOSIS — R488 Other symbolic dysfunctions: Secondary | ICD-10-CM | POA: Diagnosis not present

## 2023-04-18 DIAGNOSIS — R262 Difficulty in walking, not elsewhere classified: Secondary | ICD-10-CM | POA: Diagnosis not present

## 2023-04-18 DIAGNOSIS — M6281 Muscle weakness (generalized): Secondary | ICD-10-CM | POA: Diagnosis not present

## 2023-04-19 DIAGNOSIS — R2689 Other abnormalities of gait and mobility: Secondary | ICD-10-CM | POA: Diagnosis not present

## 2023-04-19 DIAGNOSIS — R262 Difficulty in walking, not elsewhere classified: Secondary | ICD-10-CM | POA: Diagnosis not present

## 2023-04-19 DIAGNOSIS — M6281 Muscle weakness (generalized): Secondary | ICD-10-CM | POA: Diagnosis not present

## 2023-04-19 DIAGNOSIS — D649 Anemia, unspecified: Secondary | ICD-10-CM | POA: Diagnosis not present

## 2023-04-19 DIAGNOSIS — R488 Other symbolic dysfunctions: Secondary | ICD-10-CM | POA: Diagnosis not present

## 2023-04-20 DIAGNOSIS — R262 Difficulty in walking, not elsewhere classified: Secondary | ICD-10-CM | POA: Diagnosis not present

## 2023-04-20 DIAGNOSIS — M2351 Chronic instability of knee, right knee: Secondary | ICD-10-CM | POA: Diagnosis not present

## 2023-04-20 DIAGNOSIS — M6281 Muscle weakness (generalized): Secondary | ICD-10-CM | POA: Diagnosis not present

## 2023-04-20 DIAGNOSIS — R488 Other symbolic dysfunctions: Secondary | ICD-10-CM | POA: Diagnosis not present

## 2023-04-20 DIAGNOSIS — D649 Anemia, unspecified: Secondary | ICD-10-CM | POA: Diagnosis not present

## 2023-04-20 DIAGNOSIS — R2689 Other abnormalities of gait and mobility: Secondary | ICD-10-CM | POA: Diagnosis not present

## 2023-04-23 DIAGNOSIS — R262 Difficulty in walking, not elsewhere classified: Secondary | ICD-10-CM | POA: Diagnosis not present

## 2023-04-23 DIAGNOSIS — D649 Anemia, unspecified: Secondary | ICD-10-CM | POA: Diagnosis not present

## 2023-04-23 DIAGNOSIS — M6281 Muscle weakness (generalized): Secondary | ICD-10-CM | POA: Diagnosis not present

## 2023-04-23 DIAGNOSIS — R2689 Other abnormalities of gait and mobility: Secondary | ICD-10-CM | POA: Diagnosis not present

## 2023-04-23 DIAGNOSIS — R488 Other symbolic dysfunctions: Secondary | ICD-10-CM | POA: Diagnosis not present

## 2023-04-24 DIAGNOSIS — D649 Anemia, unspecified: Secondary | ICD-10-CM | POA: Diagnosis not present

## 2023-04-24 DIAGNOSIS — R2689 Other abnormalities of gait and mobility: Secondary | ICD-10-CM | POA: Diagnosis not present

## 2023-04-24 DIAGNOSIS — M6281 Muscle weakness (generalized): Secondary | ICD-10-CM | POA: Diagnosis not present

## 2023-04-24 DIAGNOSIS — R262 Difficulty in walking, not elsewhere classified: Secondary | ICD-10-CM | POA: Diagnosis not present

## 2023-04-24 DIAGNOSIS — R488 Other symbolic dysfunctions: Secondary | ICD-10-CM | POA: Diagnosis not present

## 2023-04-25 DIAGNOSIS — M6281 Muscle weakness (generalized): Secondary | ICD-10-CM | POA: Diagnosis not present

## 2023-04-25 DIAGNOSIS — R262 Difficulty in walking, not elsewhere classified: Secondary | ICD-10-CM | POA: Diagnosis not present

## 2023-04-25 DIAGNOSIS — R488 Other symbolic dysfunctions: Secondary | ICD-10-CM | POA: Diagnosis not present

## 2023-04-25 DIAGNOSIS — R2689 Other abnormalities of gait and mobility: Secondary | ICD-10-CM | POA: Diagnosis not present

## 2023-04-25 DIAGNOSIS — D649 Anemia, unspecified: Secondary | ICD-10-CM | POA: Diagnosis not present

## 2023-05-01 DIAGNOSIS — D649 Anemia, unspecified: Secondary | ICD-10-CM | POA: Diagnosis not present

## 2023-05-01 DIAGNOSIS — Z79899 Other long term (current) drug therapy: Secondary | ICD-10-CM | POA: Diagnosis not present

## 2023-05-01 DIAGNOSIS — I739 Peripheral vascular disease, unspecified: Secondary | ICD-10-CM | POA: Diagnosis not present

## 2023-05-01 DIAGNOSIS — I10 Essential (primary) hypertension: Secondary | ICD-10-CM | POA: Diagnosis not present

## 2023-05-10 DIAGNOSIS — E039 Hypothyroidism, unspecified: Secondary | ICD-10-CM | POA: Diagnosis not present

## 2023-05-10 LAB — TSH: TSH: 0.19 — AB (ref 0.41–5.90)

## 2023-05-16 DIAGNOSIS — D649 Anemia, unspecified: Secondary | ICD-10-CM | POA: Diagnosis not present

## 2023-05-16 DIAGNOSIS — I739 Peripheral vascular disease, unspecified: Secondary | ICD-10-CM | POA: Diagnosis not present

## 2023-05-16 DIAGNOSIS — F1021 Alcohol dependence, in remission: Secondary | ICD-10-CM | POA: Diagnosis not present

## 2023-05-16 DIAGNOSIS — E039 Hypothyroidism, unspecified: Secondary | ICD-10-CM | POA: Diagnosis not present

## 2023-05-16 DIAGNOSIS — E785 Hyperlipidemia, unspecified: Secondary | ICD-10-CM | POA: Diagnosis not present

## 2023-05-16 DIAGNOSIS — F419 Anxiety disorder, unspecified: Secondary | ICD-10-CM | POA: Diagnosis not present

## 2023-05-16 DIAGNOSIS — R911 Solitary pulmonary nodule: Secondary | ICD-10-CM | POA: Diagnosis not present

## 2023-05-16 DIAGNOSIS — I10 Essential (primary) hypertension: Secondary | ICD-10-CM | POA: Diagnosis not present

## 2023-05-16 DIAGNOSIS — Z8673 Personal history of transient ischemic attack (TIA), and cerebral infarction without residual deficits: Secondary | ICD-10-CM | POA: Diagnosis not present

## 2023-05-17 ENCOUNTER — Encounter: Payer: Self-pay | Admitting: Nurse Practitioner

## 2023-05-17 ENCOUNTER — Ambulatory Visit (INDEPENDENT_AMBULATORY_CARE_PROVIDER_SITE_OTHER): Payer: Medicare HMO | Admitting: Nurse Practitioner

## 2023-05-17 VITALS — BP 125/69 | HR 74 | Ht 66.5 in | Wt 134.6 lb

## 2023-05-17 DIAGNOSIS — E039 Hypothyroidism, unspecified: Secondary | ICD-10-CM

## 2023-05-17 NOTE — Patient Instructions (Signed)

## 2023-05-17 NOTE — Progress Notes (Signed)
Endocrinology Follow Up Note                                         05/17/2023, 1:35 PM  Subjective:   Subjective    [name] is a 77 y.o.-year-old female patient being seen in follow up after being seen in consultation for hypothyroidism referred by Mechele Claude, MD.   Past Medical History:  Diagnosis Date   Acute pyelonephritis 10/15/2019   Acute renal failure superimposed on stage 2 chronic kidney disease (HCC) 10/15/2019   Alcoholism (HCC) 01/09/2012   Anxiety    Arthritis    Left knee   B12 deficiency    Carotid artery narrowing 10/03/2012   On the right.   Chronic kidney disease (CKD), stage II (mild)    Depression    Headache(784.0)    Hyperlipidemia    Hypertension    Hyponatremia 01/09/2012   Hypothyroid    Incontinence of urine in female    Menopause    Mini stroke    Orthostatic syncope 10/02/2012   SBO (small bowel obstruction) (HCC) 06/28/2016   Stroke (HCC) 2010   no deficits   Thrombocytopenia (HCC) 10/15/2019    Past Surgical History:  Procedure Laterality Date   AGILE CAPSULE N/A 02/22/2016   dummy capsule remained in the distal ileum   APPENDECTOMY     CATARACT EXTRACTION W/PHACO  06/20/2012   Procedure: CATARACT EXTRACTION PHACO AND INTRAOCULAR LENS PLACEMENT (IOC);  Surgeon: Gemma Payor, MD;  Location: [facility];  Service: Ophthalmology;  Laterality: Right;  CDE:10.66   CATARACT EXTRACTION W/PHACO  07/11/2012   Procedure: CATARACT EXTRACTION PHACO AND INTRAOCULAR LENS PLACEMENT (IOC);  Surgeon: Gemma Payor, MD;  Location: [facility];  Service: Ophthalmology;  Laterality: Left;  CDE: 12.69   COLONOSCOPY N/A 12/17/2015   RMR: Marked circumferential ulceration of the ascending colon/Ileocecal valve ulceration with biopsy most consistent with NSAID injury versus inflammatory bowel disease, pancolonoc diverticulosis redundant colon   ESOPHAGOGASTRODUODENOSCOPY N/A 12/17/2015   [id number]  gastric polyp, biopsy benign   KNEE ARTHROSCOPY Right 09/2016   LUMBAR LAMINECTOMY/DECOMPRESSION MICRODISCECTOMY N/A 05/25/2017   Procedure: CENTRAL DECOMPRESSIVE LUMBAR LAMINECTOMY FOR SPINAL STENOSIS L3-L4, FORAMINOTOMY FOR THE L4 ROOT AND L5 ROOT;  Surgeon: Ranee Gosselin, MD;  Location: [facility];  Service: Orthopedics;  Laterality: N/A;   TONSILLECTOMY     TOTAL KNEE ARTHROPLASTY Left 10/31/2021   Procedure: TOTAL KNEE ARTHROPLASTY;  Surgeon: Ollen Gross, MD;  Location: [facility];  Service: Orthopedics;  Laterality: Left;    Social History   Socioeconomic History   Marital status: Married    Spouse name: Beaulah Dinning"   Number of children: 2   Years of education: 14   Highest education level: Some college, no degree  Occupational History   Occupation: Investment banker, operational: [employer]    Comment: Retired  Tobacco Use   Smoking status: Former    Packs/day: 3.00    Years: 30.00    Additional pack years: 0.00  Total pack years: 90.00    Types: Cigarettes    Quit date: 06/17/1992    Years since quitting: 30.9   Smokeless tobacco: Never   Tobacco comments:    3 packs a day x 30 years former smoker.  Not currently smoking  Vaping Use   Vaping Use: Never used  Substance and Sexual Activity   Alcohol use: No   Drug use: No   Sexual activity: Not Currently  Other Topics Concern   Not on file  Social History Narrative   Lives on one level with her husband, [name] lives 3 streets over   Social Determinants of Health   Financial Resource Strain: Low Risk  (11/28/2021)   Overall Financial Resource Strain (CARDIA)    Difficulty of Paying Living Expenses: Not hard at all  Food Insecurity: No Food Insecurity (11/28/2021)   Hunger Vital Sign    Worried About Running Out of Food in the Last Year: Never true    Ran Out of Food in the Last Year: Never true  Transportation Needs: No Transportation Needs (11/28/2021)   PRAPARE - Scientist, research (physical sciences) (Medical): No    Lack of Transportation (Non-Medical): No  Physical Activity: Insufficiently Active (11/28/2021)   Exercise Vital Sign    Days of Exercise per Week: 7 days    Minutes of Exercise per Session: 10 min  Stress: No Stress Concern Present (11/28/2021)   [employer] of Occupational Health - Occupational Stress Questionnaire    Feeling of Stress : Not at all  Social Connections: Moderately Isolated (11/28/2021)   Social Connection and Isolation Panel [NHANES]    Frequency of Communication with Friends and Family: More than three times a week    Frequency of Social Gatherings with Friends and Family: More than three times a week    Attends Religious Services: Never    Database administrator or Organizations: No    Attends Engineer, structural: Never    Marital Status: Married    Family History  Problem Relation Age of Onset   GER disease Mother    Ulcers Mother    Hypertension Maternal Grandmother     Outpatient Encounter Medications as of 05/17/2023  Medication Sig   acetaminophen (TYLENOL) 500 MG tablet Take 1,000 mg by mouth every 12 (twelve) hours as needed.   atorvastatin (LIPITOR) 20 MG tablet Take 20 mg by mouth daily.   feeding supplement (ENSURE ENLIVE / ENSURE PLUS) LIQD Take 237 mLs by mouth 2 (two) times daily between meals.   ferrous sulfate 325 (65 FE) MG tablet Take 325 mg by mouth daily with breakfast.   levothyroxine (SYNTHROID) 100 MCG tablet Take 100 mcg by mouth daily before breakfast.   Menthol-Zinc Oxide 0.44-20.625 % OINT Apply topically.   metoprolol succinate (TOPROL-XL) 50 MG 24 hr tablet Take 1 tablet (50 mg total) by mouth daily. Take with or immediately following a meal.   Multiple Vitamin (MULTIVITAMIN) tablet Take 1 tablet by mouth daily.   Multiple Vitamins-Minerals (OCUVITE ADULT 50+) CAPS Take by mouth.   Omega 3 1000 MG CAPS Take by mouth daily.   vitamin B-12 (CYANOCOBALAMIN) 500 MCG tablet Take 500 mcg by  mouth daily.   vitamin C (ASCORBIC ACID) 500 MG tablet Take 500 mg by mouth daily.   ondansetron (ZOFRAN) 8 MG tablet Take by mouth every 8 (eight) hours as needed for nausea or vomiting. (Patient not taking: Reported on 03/28/2023)   saccharomyces  boulardii (FLORASTOR) 250 MG capsule Take 1 capsule (250 mg total) by mouth 2 (two) times daily. (Patient not taking: Reported on 03/28/2023)   No facility-administered encounter medications on file as of 05/17/2023.    ALLERGIES: Allergies  Allergen Reactions   Ciprofloxacin Other (See Comments)    Caused pt to have delusional thoughts and hallucinations   Procardia [Nifedipine] Swelling    Edema     Ativan [Lorazepam]    Norvasc [Amlodipine Besylate] Hives   VACCINATION STATUS: Immunization History  Administered Date(s) Administered   Fluad Quad(high Dose 65+) 09/29/2019, 09/07/2020   Influenza, High Dose Seasonal PF 09/23/2014, 09/11/2016, 09/05/2017, 09/16/2018   Influenza,inj,Quad PF,6+ Mos 10/01/2013, 09/10/2015   Moderna SARS-COV2 Booster Vaccination 11/24/2020, 09/07/2021   Moderna Sars-Covid-2 Vaccination 02/19/2020, 03/24/2020   Pneumococcal Conjugate-13 05/12/2016   Pneumococcal Polysaccharide-23 01/10/2012, 04/27/2015   Tdap 12/14/2015   Zoster Recombinat (Shingrix) 11/28/2019, 04/22/2020     HPI   Crystallee Mancillas is a patient with the above medical history. She resides in a skilled nursing facility and is accompanied by a care attendant from the nursing home.   she was diagnosed with hypothyroidism a long time ago (unable to give me approximate age of diagnosis) which required subsequent initiation of thyroid hormone replacement. she was given various doses of Levothyroxine over the years, currently on 100 micrograms . she reports compliance to this medication:  However she reports her nursing home attendants have not been giving it to her separately as asked at last visit.   I reviewed patient's thyroid tests:  Lab  Results  Component Value Date   TSH 0.19 (A) 05/10/2023   TSH 0.07 (A) 12/14/2022   TSH 0.17 (A) 08/30/2022   TSH 100.00 (A) 06/02/2022   TSH 122.00 (A) 05/23/2022   TSH 100.00 (A) 03/29/2022   TSH 174.548 (H) 03/14/2022   TSH 179.378 (H) 03/14/2022   TSH 100.00 (A) 03/14/2022   TSH 0.425 (L) 07/26/2021   FREET4 <0.25 (L) 03/14/2022   FREET4 1.29 07/26/2021   FREET4 4.58 (H) 04/25/2021   FREET4 2.34 (H) 01/26/2021   FREET4 3.04 (H) 11/02/2020   FREET4 2.21 (H) 09/07/2020   FREET4 2.09 (H) 03/03/2020   FREET4 1.30 01/19/2020   FREET4 0.62 (L) 10/29/2019   FREET4 1.42 07/16/2019      Pt denies feeling nodules in neck, hoarseness, dysphagia/odynophagia, SOB with lying down.  she is unaware of any family history of thyroid disorders.  No family history of thyroid cancer.  No history of radiation therapy to head or neck.  No recent use of iodine supplements.  Denies use of Biotin containing supplements.  I reviewed her chart and she also has a history of AKI, encephalopathy, Cdiff, HTN, HLD, failure to thrive, TIA, CVA.   Review of systems  Constitutional: + Minimally fluctuating body weight,  current Body mass index is 21.4 kg/m. , no fatigue, no subjective hyperthermia, no subjective hypothermia Eyes: no blurry vision, no xerophthalmia ENT: no sore throat, no nodules palpated in throat, no dysphagia/odynophagia, no hoarseness Cardiovascular: no chest pain, no shortness of breath, no palpitations, no leg swelling Respiratory: no cough, no shortness of breath Gastrointestinal: no nausea/vomiting/diarrhea Musculoskeletal: no muscle/joint aches Skin: no rashes, no hyperemia Neurological: no tremors, no numbness, no tingling, no dizziness Psychiatric: no depression, no anxiety   Objective:   Objective     BP 125/69 (BP Location: Left Arm, Patient Position: Sitting, Cuff Size: Normal)   Pulse 74   Ht 5' 6.5" (1.689 m)  Wt 134 lb 9.6 oz (61.1 kg)   LMP 08/21/2013    BMI 21.40 kg/m  Wt Readings from Last 3 Encounters:  05/17/23 134 lb 9.6 oz (61.1 kg)  03/28/23 137 lb 6.4 oz (62.3 kg)  01/24/23 132 lb (59.9 kg)    BP Readings from Last 3 Encounters:  05/17/23 125/69  03/28/23 118/72  02/02/23 138/80     Constitutional:  Body mass index is 21.4 kg/m., not in acute distress Eyes: PERRLA, EOMI, no exophthalmos Gastrointestinal: abdomen soft, non-tender, no distension, bowel sounds present Musculoskeletal: WC bound due to deconditioning Skin: moist, warm, no rashes Neurological: no tremor with outstretched hands, deep tendon reflexes normal in BLE.   CMP ( most recent) CMP     Component Value Date/Time   NA 132 (L) 03/17/2022 0515   NA 139 09/21/2021 1558   K 3.4 (L) 03/17/2022 0515   CL 106 03/17/2022 0515   CO2 18 (L) 03/17/2022 0515   GLUCOSE 87 03/17/2022 0515   BUN 11 03/17/2022 0515   BUN 9 09/21/2021 1558   CREATININE 1.40 (H) 03/17/2022 0515   CREATININE 1.59 (H) 01/27/2016 1135   CALCIUM 8.5 (L) 03/17/2022 0515   PROT 5.8 (L) 03/14/2022 0341   PROT 6.4 09/21/2021 1558   ALBUMIN 2.9 (L) 03/14/2022 0341   ALBUMIN 4.0 09/21/2021 1558   AST 12 (L) 03/14/2022 0341   ALT 8 03/14/2022 0341   ALKPHOS 55 03/14/2022 0341   BILITOT 1.1 03/14/2022 0341   BILITOT 0.7 09/21/2021 1558   GFRNONAA 39 (L) 03/17/2022 0515   GFRAA 54 (L) 01/26/2021 1622     Diabetic Labs (most recent): Lab Results  Component Value Date   HGBA1C 4.7 11/13/2013   HGBA1C 5.0 10/22/2013     Lipid Panel ( most recent) Lipid Panel     Component Value Date/Time   CHOL 145 09/21/2021 1558   TRIG 354 (H) 09/21/2021 1558   HDL 69 09/21/2021 1558   CHOLHDL 2.1 09/21/2021 1558   CHOLHDL 4.5 10/22/2013 0450   VLDL 58 (H) 10/22/2013 0450   LDLCALC 26 09/21/2021 1558   LABVLDL 50 (H) 09/21/2021 1558       Lab Results  Component Value Date   TSH 0.19 (A) 05/10/2023   TSH 0.07 (A) 12/14/2022   TSH 0.17 (A) 08/30/2022   TSH 100.00 (A) 06/02/2022    TSH 122.00 (A) 05/23/2022   TSH 100.00 (A) 03/29/2022   TSH 174.548 (H) 03/14/2022   TSH 179.378 (H) 03/14/2022   TSH 100.00 (A) 03/14/2022   TSH 0.425 (L) 07/26/2021   FREET4 <0.25 (L) 03/14/2022   FREET4 1.29 07/26/2021   FREET4 4.58 (H) 04/25/2021   FREET4 2.34 (H) 01/26/2021   FREET4 3.04 (H) 11/02/2020   FREET4 2.21 (H) 09/07/2020   FREET4 2.09 (H) 03/03/2020   FREET4 1.30 01/19/2020   FREET4 0.62 (L) 10/29/2019   FREET4 1.42 07/16/2019     Thyroid US from 03/14/22 CLINICAL DATA:  Hypothyroid. On thyroid hormone replacement therapy.   EXAM: THYROID ULTRASOUND   TECHNIQUE: Ultrasound examination of the thyroid gland and adjacent soft tissues was performed.   COMPARISON:  None.   FINDINGS: Parenchymal Echotexture: Markedly heterogenous   Isthmus: 0.1 cm   Right lobe: 4.1 x 1.8 x 0.9 cm   Left lobe: 4.3 x 1.0 x 1.0 cm   _________________________________________________________   Estimated total number of nodules >/= 1 cm: 0   Number of spongiform nodules >/=  2 cm not described below (TR1): 0  Number of mixed cystic and solid nodules >/= 1.5 cm not described below (TR2): 0   _________________________________________________________   No discrete nodules are seen within the thyroid gland.   IMPRESSION: Small heterogeneous thyroid gland. Imaging appearance is consistent with chronic thyroid hormone replacement therapy in the setting of hypothyroidism.   No thyroid nodules are visualized.     Electronically Signed   By: Malachy Moan M.D.   On: 03/14/2022 10:57   Latest Reference Range & Units 03/29/22 00:00 05/23/22 00:00 06/02/22 00:00 08/30/22 00:00 12/14/22 00:00 05/10/23 00:00  TSH 0.41 - 5.90  100.00 ! (E) 122.00 ! (C) (E) 100.00 ! (E) 0.17 ! (E) 0.07 ! (E) 0.19 ! (E)  !: Data is abnormal (C): Corrected (E): External lab result   Assessment & Plan:   ASSESSMENT / PLAN:  1. Hypothyroidism-acquired  Patient with long-standing  hypothyroidism, on levothyroxine therapy. On physical exam, patient  does not have gross goiter, thyroid nodules, or neck compression symptoms.  Her antibody testing was negative, ruling out autoimmune etiology.    Her previsit thyroid function tests are consistent with appropriate hormone replacement.  TSH still suppressed but Free T4 normal and she has no symptoms of over-replacement.  She is advised to continue Levothyroxine 100 mcg po daily before breakfast.  Will recheck TFTs prior to next visit and adjust dose accordingly.  - We discussed about correct intake of levothyroxine, at fasting, with water, separated by at least 30 minutes from breakfast, and separated by more than 4 hours from calcium, iron, multivitamins, acid reflux medications (PPIs). -Patient is made aware of the fact that thyroid hormone replacement is needed for life, dose to be adjusted by periodic monitoring of thyroid function tests.  -She recently had thyroid ultrasound on 03/14/22 showing small thyroid gland and no suspicious nodularities.     I spent  27  minutes in the care of the patient today including review of labs from Thyroid Function, CMP, and other relevant labs ; imaging/biopsy records (current and previous including abstractions from other facilities); face-to-face time discussing  her lab results and symptoms, medications doses, her options of short and long term treatment based on the latest standards of care / guidelines;   and documenting the encounter.  Isaac Bliss  participated in the discussions, expressed understanding, and voiced agreement with the above plans.  All questions were answered to her satisfaction. she is encouraged to contact clinic should she have any questions or concerns prior to her return visit.   FOLLOW UP PLAN:  Return in about 4 months (around 09/16/2023) for Thyroid follow up, Previsit labs.  Ronny Bacon, [facility] [address] [place], [place] [address] Phone: [phone] Fax: [phone]  05/17/2023, 1:35 PM

## 2023-06-20 DIAGNOSIS — L603 Nail dystrophy: Secondary | ICD-10-CM | POA: Diagnosis not present

## 2023-06-20 DIAGNOSIS — I739 Peripheral vascular disease, unspecified: Secondary | ICD-10-CM | POA: Diagnosis not present

## 2023-06-25 DIAGNOSIS — I10 Essential (primary) hypertension: Secondary | ICD-10-CM | POA: Diagnosis not present

## 2023-06-25 DIAGNOSIS — Z79899 Other long term (current) drug therapy: Secondary | ICD-10-CM | POA: Diagnosis not present

## 2023-06-25 DIAGNOSIS — E039 Hypothyroidism, unspecified: Secondary | ICD-10-CM | POA: Diagnosis not present

## 2023-06-25 DIAGNOSIS — I739 Peripheral vascular disease, unspecified: Secondary | ICD-10-CM | POA: Diagnosis not present

## 2023-07-13 DIAGNOSIS — M6281 Muscle weakness (generalized): Secondary | ICD-10-CM | POA: Diagnosis not present

## 2023-07-13 DIAGNOSIS — D649 Anemia, unspecified: Secondary | ICD-10-CM | POA: Diagnosis not present

## 2023-07-14 DIAGNOSIS — M6281 Muscle weakness (generalized): Secondary | ICD-10-CM | POA: Diagnosis not present

## 2023-07-14 DIAGNOSIS — D649 Anemia, unspecified: Secondary | ICD-10-CM | POA: Diagnosis not present

## 2023-07-15 DIAGNOSIS — D649 Anemia, unspecified: Secondary | ICD-10-CM | POA: Diagnosis not present

## 2023-07-15 DIAGNOSIS — M6281 Muscle weakness (generalized): Secondary | ICD-10-CM | POA: Diagnosis not present

## 2023-07-16 DIAGNOSIS — M6281 Muscle weakness (generalized): Secondary | ICD-10-CM | POA: Diagnosis not present

## 2023-07-16 DIAGNOSIS — D649 Anemia, unspecified: Secondary | ICD-10-CM | POA: Diagnosis not present

## 2023-07-17 DIAGNOSIS — M6281 Muscle weakness (generalized): Secondary | ICD-10-CM | POA: Diagnosis not present

## 2023-07-17 DIAGNOSIS — D649 Anemia, unspecified: Secondary | ICD-10-CM | POA: Diagnosis not present

## 2023-07-18 DIAGNOSIS — D649 Anemia, unspecified: Secondary | ICD-10-CM | POA: Diagnosis not present

## 2023-07-18 DIAGNOSIS — M6281 Muscle weakness (generalized): Secondary | ICD-10-CM | POA: Diagnosis not present

## 2023-07-19 DIAGNOSIS — D649 Anemia, unspecified: Secondary | ICD-10-CM | POA: Diagnosis not present

## 2023-07-19 DIAGNOSIS — M6281 Muscle weakness (generalized): Secondary | ICD-10-CM | POA: Diagnosis not present

## 2023-07-20 DIAGNOSIS — D649 Anemia, unspecified: Secondary | ICD-10-CM | POA: Diagnosis not present

## 2023-07-20 DIAGNOSIS — M6281 Muscle weakness (generalized): Secondary | ICD-10-CM | POA: Diagnosis not present

## 2023-07-23 DIAGNOSIS — D649 Anemia, unspecified: Secondary | ICD-10-CM | POA: Diagnosis not present

## 2023-07-23 DIAGNOSIS — M6281 Muscle weakness (generalized): Secondary | ICD-10-CM | POA: Diagnosis not present

## 2023-07-24 DIAGNOSIS — N1832 Chronic kidney disease, stage 3b: Secondary | ICD-10-CM | POA: Diagnosis not present

## 2023-07-24 DIAGNOSIS — M6281 Muscle weakness (generalized): Secondary | ICD-10-CM | POA: Diagnosis not present

## 2023-07-24 DIAGNOSIS — R627 Adult failure to thrive: Secondary | ICD-10-CM | POA: Diagnosis not present

## 2023-07-24 DIAGNOSIS — F419 Anxiety disorder, unspecified: Secondary | ICD-10-CM | POA: Diagnosis not present

## 2023-07-24 DIAGNOSIS — I10 Essential (primary) hypertension: Secondary | ICD-10-CM | POA: Diagnosis not present

## 2023-07-24 DIAGNOSIS — Z79899 Other long term (current) drug therapy: Secondary | ICD-10-CM | POA: Diagnosis not present

## 2023-07-24 DIAGNOSIS — D649 Anemia, unspecified: Secondary | ICD-10-CM | POA: Diagnosis not present

## 2023-07-25 DIAGNOSIS — M6281 Muscle weakness (generalized): Secondary | ICD-10-CM | POA: Diagnosis not present

## 2023-07-25 DIAGNOSIS — D649 Anemia, unspecified: Secondary | ICD-10-CM | POA: Diagnosis not present

## 2023-07-26 DIAGNOSIS — D649 Anemia, unspecified: Secondary | ICD-10-CM | POA: Diagnosis not present

## 2023-07-26 DIAGNOSIS — M6281 Muscle weakness (generalized): Secondary | ICD-10-CM | POA: Diagnosis not present

## 2023-07-27 DIAGNOSIS — D649 Anemia, unspecified: Secondary | ICD-10-CM | POA: Diagnosis not present

## 2023-07-27 DIAGNOSIS — M6281 Muscle weakness (generalized): Secondary | ICD-10-CM | POA: Diagnosis not present

## 2023-07-29 ENCOUNTER — Encounter: Payer: Self-pay | Admitting: Pharmacist

## 2023-07-29 NOTE — Progress Notes (Signed)
Pharmacy Quality Measure Review  This patient is appearing on a report for being at risk of failing the adherence measure for cholesterol (statin) medications this calendar year.   Medication: atorvastatin 20 mg daily Last fill date: 06/04/23 for 30 day supply  Pt resides in a SNF. Insurance report was not up to date. No action needed at this time.   [name], PharmD PGY-1 Pharmacy Resident

## 2023-07-30 DIAGNOSIS — M6281 Muscle weakness (generalized): Secondary | ICD-10-CM | POA: Diagnosis not present

## 2023-07-30 DIAGNOSIS — D649 Anemia, unspecified: Secondary | ICD-10-CM | POA: Diagnosis not present

## 2023-07-31 ENCOUNTER — Other Ambulatory Visit: Payer: Self-pay | Admitting: Family Medicine

## 2023-07-31 DIAGNOSIS — E782 Mixed hyperlipidemia: Secondary | ICD-10-CM | POA: Diagnosis not present

## 2023-07-31 DIAGNOSIS — D649 Anemia, unspecified: Secondary | ICD-10-CM | POA: Diagnosis not present

## 2023-07-31 DIAGNOSIS — M6281 Muscle weakness (generalized): Secondary | ICD-10-CM | POA: Diagnosis not present

## 2023-07-31 DIAGNOSIS — Z79899 Other long term (current) drug therapy: Secondary | ICD-10-CM | POA: Diagnosis not present

## 2023-07-31 DIAGNOSIS — Z1231 Encounter for screening mammogram for malignant neoplasm of breast: Secondary | ICD-10-CM

## 2023-08-01 DIAGNOSIS — D649 Anemia, unspecified: Secondary | ICD-10-CM | POA: Diagnosis not present

## 2023-08-01 DIAGNOSIS — M6281 Muscle weakness (generalized): Secondary | ICD-10-CM | POA: Diagnosis not present

## 2023-08-02 DIAGNOSIS — D649 Anemia, unspecified: Secondary | ICD-10-CM | POA: Diagnosis not present

## 2023-08-02 DIAGNOSIS — M6281 Muscle weakness (generalized): Secondary | ICD-10-CM | POA: Diagnosis not present

## 2023-08-03 DIAGNOSIS — D649 Anemia, unspecified: Secondary | ICD-10-CM | POA: Diagnosis not present

## 2023-08-03 DIAGNOSIS — M6281 Muscle weakness (generalized): Secondary | ICD-10-CM | POA: Diagnosis not present

## 2023-08-06 DIAGNOSIS — D649 Anemia, unspecified: Secondary | ICD-10-CM | POA: Diagnosis not present

## 2023-08-06 DIAGNOSIS — M6281 Muscle weakness (generalized): Secondary | ICD-10-CM | POA: Diagnosis not present

## 2023-08-07 DIAGNOSIS — M6281 Muscle weakness (generalized): Secondary | ICD-10-CM | POA: Diagnosis not present

## 2023-08-07 DIAGNOSIS — D649 Anemia, unspecified: Secondary | ICD-10-CM | POA: Diagnosis not present

## 2023-08-20 DIAGNOSIS — Z79899 Other long term (current) drug therapy: Secondary | ICD-10-CM | POA: Diagnosis not present

## 2023-08-20 DIAGNOSIS — N189 Chronic kidney disease, unspecified: Secondary | ICD-10-CM | POA: Diagnosis not present

## 2023-08-20 DIAGNOSIS — R600 Localized edema: Secondary | ICD-10-CM | POA: Diagnosis not present

## 2023-09-04 ENCOUNTER — Ambulatory Visit
Admission: RE | Admit: 2023-09-04 | Discharge: 2023-09-04 | Disposition: A | Discharge status: Home / Self Care | Payer: Medicare HMO | Source: Ambulatory Visit | Attending: Family Medicine | Admitting: Family Medicine

## 2023-09-04 DIAGNOSIS — Z1231 Encounter for screening mammogram for malignant neoplasm of breast: Secondary | ICD-10-CM

## 2023-09-12 DIAGNOSIS — F1021 Alcohol dependence, in remission: Secondary | ICD-10-CM | POA: Diagnosis not present

## 2023-09-12 DIAGNOSIS — F419 Anxiety disorder, unspecified: Secondary | ICD-10-CM | POA: Diagnosis not present

## 2023-09-12 DIAGNOSIS — E538 Deficiency of other specified B group vitamins: Secondary | ICD-10-CM | POA: Diagnosis not present

## 2023-09-12 DIAGNOSIS — Z8673 Personal history of transient ischemic attack (TIA), and cerebral infarction without residual deficits: Secondary | ICD-10-CM | POA: Diagnosis not present

## 2023-09-12 DIAGNOSIS — E785 Hyperlipidemia, unspecified: Secondary | ICD-10-CM | POA: Diagnosis not present

## 2023-09-12 DIAGNOSIS — I10 Essential (primary) hypertension: Secondary | ICD-10-CM | POA: Diagnosis not present

## 2023-09-12 DIAGNOSIS — D649 Anemia, unspecified: Secondary | ICD-10-CM | POA: Diagnosis not present

## 2023-09-12 DIAGNOSIS — E039 Hypothyroidism, unspecified: Secondary | ICD-10-CM | POA: Diagnosis not present

## 2023-09-12 DIAGNOSIS — R911 Solitary pulmonary nodule: Secondary | ICD-10-CM | POA: Diagnosis not present

## 2023-09-12 LAB — TSH: TSH: 0.22 — AB (ref 0.41–5.90)

## 2023-09-13 NOTE — Patient Instructions (Signed)

## 2023-09-17 ENCOUNTER — Ambulatory Visit (INDEPENDENT_AMBULATORY_CARE_PROVIDER_SITE_OTHER): Payer: Medicare HMO | Admitting: Nurse Practitioner

## 2023-09-17 ENCOUNTER — Encounter: Payer: Self-pay | Admitting: Nurse Practitioner

## 2023-09-17 VITALS — BP 140/82 | HR 63 | Ht 66.5 in

## 2023-09-17 DIAGNOSIS — E039 Hypothyroidism, unspecified: Secondary | ICD-10-CM

## 2023-09-17 MED ORDER — LEVOTHYROXINE SODIUM 100 MCG PO TABS
100.0000 ug | ORAL_TABLET | Freq: Every day | ORAL | 3 refills | Status: DC
Start: 2023-09-17 — End: 2024-06-19

## 2023-09-17 NOTE — Progress Notes (Signed)
Endocrinology Follow Up Note                                         09/17/2023, 1:37 PM  Subjective:   Subjective    [name] is a 77 y.o.-year-old female patient being seen in follow up after being seen in consultation for hypothyroidism referred by Kelsey Claude, MD.   Past Medical History:  Diagnosis Date   Acute pyelonephritis 10/15/2019   Acute renal failure superimposed on stage 2 chronic kidney disease (HCC) 10/15/2019   Alcoholism (HCC) 01/09/2012   Anxiety    Arthritis    Left knee   B12 deficiency    Carotid artery narrowing 10/03/2012   On the right.   Chronic kidney disease (CKD), stage II (mild)    Depression    Headache(784.0)    Hyperlipidemia    Hypertension    Hyponatremia 01/09/2012   Hypothyroid    Incontinence of urine in female    Menopause    Mini stroke    Orthostatic syncope 10/02/2012   SBO (small bowel obstruction) (HCC) 06/28/2016   Stroke (HCC) 2010   no deficits   Thrombocytopenia (HCC) 10/15/2019    Past Surgical History:  Procedure Laterality Date   AGILE CAPSULE N/A 02/22/2016   dummy capsule remained in the distal ileum   APPENDECTOMY     CATARACT EXTRACTION W/PHACO  06/20/2012   Procedure: CATARACT EXTRACTION PHACO AND INTRAOCULAR LENS PLACEMENT (IOC);  Surgeon: Gemma Payor, MD;  Location: [facility];  Service: Ophthalmology;  Laterality: Right;  CDE:10.66   CATARACT EXTRACTION W/PHACO  07/11/2012   Procedure: CATARACT EXTRACTION PHACO AND INTRAOCULAR LENS PLACEMENT (IOC);  Surgeon: Gemma Payor, MD;  Location: [facility];  Service: Ophthalmology;  Laterality: Left;  CDE: 12.69   COLONOSCOPY N/A 12/17/2015   RMR: Marked circumferential ulceration of the ascending colon/Ileocecal valve ulceration with biopsy most consistent with NSAID injury versus inflammatory bowel disease, pancolonoc diverticulosis redundant colon   ESOPHAGOGASTRODUODENOSCOPY N/A 12/17/2015   WUJ:[id number]  gastric polyp, biopsy benign   KNEE ARTHROSCOPY Right 09/2016   LUMBAR LAMINECTOMY/DECOMPRESSION MICRODISCECTOMY N/A 05/25/2017   Procedure: CENTRAL DECOMPRESSIVE LUMBAR LAMINECTOMY FOR SPINAL STENOSIS L3-L4, FORAMINOTOMY FOR THE L4 ROOT AND L5 ROOT;  Surgeon: Ranee Gosselin, MD;  Location: [facility];  Service: Orthopedics;  Laterality: N/A;   TONSILLECTOMY     TOTAL KNEE ARTHROPLASTY Left 10/31/2021   Procedure: TOTAL KNEE ARTHROPLASTY;  Surgeon: Ollen Gross, MD;  Location: [facility];  Service: Orthopedics;  Laterality: Left;    Social History   Socioeconomic History   Marital status: Married    Spouse name: Beaulah Dinning"   Number of children: 2   Years of education: 14   Highest education level: Some college, no degree  Occupational History   Occupation: Investment banker, operational: [employer]    Comment: Retired  Tobacco Use   Smoking status: Former    Current packs/day: 0.00    Average packs/day: 3.0 packs/day for 30.0 years (90.0 ttl pk-yrs)  Types: Cigarettes    Start date: 06/17/1962    Quit date: 06/17/1992    Years since quitting: 31.2   Smokeless tobacco: Never   Tobacco comments:    3 packs a day x 30 years former smoker.  Not currently smoking  Vaping Use   Vaping status: Never Used  Substance and Sexual Activity   Alcohol use: No   Drug use: No   Sexual activity: Not Currently  Other Topics Concern   Not on file  Social History Narrative   Lives on one level with her husband, [name] lives 3 streets over   Social Determinants of Health   Financial Resource Strain: Low Risk  (11/28/2021)   Overall Financial Resource Strain (CARDIA)    Difficulty of Paying Living Expenses: Not hard at all  Food Insecurity: No Food Insecurity (11/28/2021)   Hunger Vital Sign    Worried About Running Out of Food in the Last Year: Never true    Ran Out of Food in the Last Year: Never true  Transportation Needs: No Transportation Needs (11/28/2021)   PRAPARE -  Administrator, Civil Service (Medical): No    Lack of Transportation (Non-Medical): No  Physical Activity: Insufficiently Active (11/28/2021)   Exercise Vital Sign    Days of Exercise per Week: 7 days    Minutes of Exercise per Session: 10 min  Stress: No Stress Concern Present (11/28/2021)   [employer] of Occupational Health - Occupational Stress Questionnaire    Feeling of Stress : Not at all  Social Connections: Moderately Isolated (11/28/2021)   Social Connection and Isolation Panel [NHANES]    Frequency of Communication with Friends and Family: More than three times a week    Frequency of Social Gatherings with Friends and Family: More than three times a week    Attends Religious Services: Never    Database administrator or Organizations: No    Attends Engineer, structural: Never    Marital Status: Married    Family History  Problem Relation Age of Onset   GER disease Mother    Ulcers Mother    Hypertension Maternal Grandmother     Outpatient Encounter Medications as of 09/17/2023  Medication Sig   acetaminophen (TYLENOL) 500 MG tablet Take 1,000 mg by mouth every 12 (twelve) hours as needed.   atorvastatin (LIPITOR) 20 MG tablet Take 20 mg by mouth daily.   ferrous sulfate 325 (65 FE) MG tablet Take 325 mg by mouth daily with breakfast.   Menthol-Zinc Oxide 0.44-20.625 % OINT Apply topically.   metoprolol succinate (TOPROL-XL) 50 MG 24 hr tablet Take 1 tablet (50 mg total) by mouth daily. Take with or immediately following a meal.   Multiple Vitamin (MULTIVITAMIN) tablet Take 1 tablet by mouth daily.   Multiple Vitamins-Minerals (OCUVITE ADULT 50+) CAPS Take by mouth.   Omega 3 1000 MG CAPS Take by mouth daily.   saccharomyces boulardii (FLORASTOR) 250 MG capsule Take 1 capsule (250 mg total) by mouth 2 (two) times daily.   vitamin B-12 (CYANOCOBALAMIN) 500 MCG tablet Take 500 mcg by mouth daily.   vitamin C (ASCORBIC ACID) 500 MG tablet Take  500 mg by mouth daily.   [DISCONTINUED] levothyroxine (SYNTHROID) 100 MCG tablet Take 100 mcg by mouth daily before breakfast.   feeding supplement (ENSURE ENLIVE / ENSURE PLUS) LIQD Take 237 mLs by mouth 2 (two) times daily between meals. (Patient not taking: Reported on 09/17/2023)   levothyroxine (  SYNTHROID) 100 MCG tablet Take 1 tablet (100 mcg total) by mouth daily before breakfast.   ondansetron (ZOFRAN) 8 MG tablet Take by mouth every 8 (eight) hours as needed for nausea or vomiting. (Patient not taking: Reported on 03/28/2023)   No facility-administered encounter medications on file as of 09/17/2023.    ALLERGIES: Allergies  Allergen Reactions   Ciprofloxacin Other (See Comments)    Caused pt to have delusional thoughts and hallucinations   Procardia [Nifedipine] Swelling    Edema     Ativan [Lorazepam]    Norvasc [Amlodipine Besylate] Hives   VACCINATION STATUS: Immunization History  Administered Date(s) Administered   Fluad Quad(high Dose 65+) 09/29/2019, 09/07/2020   Influenza, High Dose Seasonal PF 09/23/2014, 09/11/2016, 09/05/2017, 09/16/2018   Influenza,inj,Quad PF,6+ Mos 10/01/2013, 09/10/2015   Moderna SARS-COV2 Booster Vaccination 11/24/2020, 09/07/2021   Moderna Sars-Covid-2 Vaccination 02/19/2020, 03/24/2020   Pneumococcal Conjugate-13 05/12/2016   Pneumococcal Polysaccharide-23 01/10/2012, 04/27/2015   Tdap 12/14/2015   Zoster Recombinant(Shingrix) 11/28/2019, 04/22/2020     HPI   Takeya Marquis is a patient with the above medical history. She resides in a skilled nursing facility and is accompanied by a care attendant from the nursing home.   she was diagnosed with hypothyroidism a long time ago (unable to give me approximate age of diagnosis) which required subsequent initiation of thyroid hormone replacement. she was given various doses of Levothyroxine over the years, currently on 100 micrograms . she reports compliance to this medication:  However she  reports her nursing home attendants have not been giving it to her separately as asked at last visit.   I reviewed patient's thyroid tests:  Lab Results  Component Value Date   TSH 0.22 (A) 09/12/2023   TSH 0.19 (A) 05/10/2023   TSH 0.07 (A) 12/14/2022   TSH 0.17 (A) 08/30/2022   TSH 100.00 (A) 06/02/2022   TSH 122.00 (A) 05/23/2022   TSH 100.00 (A) 03/29/2022   TSH 174.548 (H) 03/14/2022   TSH 179.378 (H) 03/14/2022   TSH 100.00 (A) 03/14/2022   FREET4 <0.25 (L) 03/14/2022   FREET4 1.29 07/26/2021   FREET4 4.58 (H) 04/25/2021   FREET4 2.34 (H) 01/26/2021   FREET4 3.04 (H) 11/02/2020   FREET4 2.21 (H) 09/07/2020   FREET4 2.09 (H) 03/03/2020   FREET4 1.30 01/19/2020   FREET4 0.62 (L) 10/29/2019   FREET4 1.42 07/16/2019      Pt denies feeling nodules in neck, hoarseness, dysphagia/odynophagia, SOB with lying down.  she is unaware of any family history of thyroid disorders.  No family history of thyroid cancer.  No history of radiation therapy to head or neck.  No recent use of iodine supplements.  Denies use of Biotin containing supplements.  I reviewed her chart and she also has a history of AKI, encephalopathy, Cdiff, HTN, HLD, failure to thrive, TIA, CVA.   Review of systems  Constitutional: + Minimally fluctuating body weight,  current Body mass index is 21.4 kg/m. , no fatigue, no subjective hyperthermia, no subjective hypothermia Eyes: no blurry vision, no xerophthalmia ENT: no sore throat, no nodules palpated in throat, no dysphagia/odynophagia, no hoarseness Cardiovascular: no chest pain, no shortness of breath, + intermittent palpitations (stable-has had them on/off for years), no leg swelling Respiratory: no cough, no shortness of breath Gastrointestinal: no nausea/vomiting/diarrhea Musculoskeletal: no muscle/joint aches Skin: no rashes, no hyperemia Neurological: no tremors, no numbness, no tingling, no dizziness Psychiatric: no depression, no  anxiety   Objective:   Objective  BP (!) 140/82 (BP Location: Right Arm, Patient Position: Sitting, Cuff Size: Large) Comment: Retake with manual cuff- Thanh Mottern made aware Comment (BP Location): Lower arm  Pulse 63   Ht 5' 6.5" (1.689 m)   LMP 08/21/2013   BMI 21.40 kg/m  Wt Readings from Last 3 Encounters:  05/17/23 134 lb 9.6 oz (61.1 kg)  03/28/23 137 lb 6.4 oz (62.3 kg)  01/24/23 132 lb (59.9 kg)    BP Readings from Last 3 Encounters:  09/17/23 (!) 140/82  05/17/23 125/69  03/28/23 118/72       Physical Exam- Limited  Constitutional:  Body mass index is 21.4 kg/m. , not in acute distress, normal state of mind Eyes:  EOMI, no exophthalmos Musculoskeletal: no gross deformities, strength intact in all four extremities, no gross restriction of joint movements Skin:  no rashes, no hyperemia Neurological: no tremor with outstretched hands   CMP ( most recent) CMP     Component Value Date/Time   Kelsey 132 (L) 03/17/2022 0515   Kelsey 139 09/21/2021 1558   K 3.4 (L) 03/17/2022 0515   CL 106 03/17/2022 0515   CO2 18 (L) 03/17/2022 0515   GLUCOSE 87 03/17/2022 0515   BUN 11 03/17/2022 0515   BUN 9 09/21/2021 1558   CREATININE 1.40 (H) 03/17/2022 0515   CREATININE 1.59 (H) 01/27/2016 1135   CALCIUM 8.5 (L) 03/17/2022 0515   PROT 5.8 (L) 03/14/2022 0341   PROT 6.4 09/21/2021 1558   ALBUMIN 2.9 (L) 03/14/2022 0341   ALBUMIN 4.0 09/21/2021 1558   AST 12 (L) 03/14/2022 0341   ALT 8 03/14/2022 0341   ALKPHOS 55 03/14/2022 0341   BILITOT 1.1 03/14/2022 0341   BILITOT 0.7 09/21/2021 1558   GFRNONAA 39 (L) 03/17/2022 0515   GFRAA 54 (L) 01/26/2021 1622     Diabetic Labs (most recent): Lab Results  Component Value Date   HGBA1C 4.7 11/13/2013   HGBA1C 5.0 10/22/2013     Lipid Panel ( most recent) Lipid Panel     Component Value Date/Time   CHOL 145 09/21/2021 1558   TRIG 354 (H) 09/21/2021 1558   HDL 69 09/21/2021 1558   CHOLHDL 2.1 09/21/2021 1558    CHOLHDL 4.5 10/22/2013 0450   VLDL 58 (H) 10/22/2013 0450   LDLCALC 26 09/21/2021 1558   LABVLDL 50 (H) 09/21/2021 1558       Lab Results  Component Value Date   TSH 0.22 (A) 09/12/2023   TSH 0.19 (A) 05/10/2023   TSH 0.07 (A) 12/14/2022   TSH 0.17 (A) 08/30/2022   TSH 100.00 (A) 06/02/2022   TSH 122.00 (A) 05/23/2022   TSH 100.00 (A) 03/29/2022   TSH 174.548 (H) 03/14/2022   TSH 179.378 (H) 03/14/2022   TSH 100.00 (A) 03/14/2022   FREET4 <0.25 (L) 03/14/2022   FREET4 1.29 07/26/2021   FREET4 4.58 (H) 04/25/2021   FREET4 2.34 (H) 01/26/2021   FREET4 3.04 (H) 11/02/2020   FREET4 2.21 (H) 09/07/2020   FREET4 2.09 (H) 03/03/2020   FREET4 1.30 01/19/2020   FREET4 0.62 (L) 10/29/2019   FREET4 1.42 07/16/2019     Thyroid US from 03/14/22 CLINICAL DATA:  Hypothyroid. On thyroid hormone replacement therapy.   EXAM: THYROID ULTRASOUND   TECHNIQUE: Ultrasound examination of the thyroid gland and adjacent soft tissues was performed.   COMPARISON:  None.   FINDINGS: Parenchymal Echotexture: Markedly heterogenous   Isthmus: 0.1 cm   Right lobe: 4.1 x 1.8 x 0.9 cm   Left  lobe: 4.3 x 1.0 x 1.0 cm   _________________________________________________________   Estimated total number of nodules >/= 1 cm: 0   Number of spongiform nodules >/=  2 cm not described below (TR1): 0   Number of mixed cystic and solid nodules >/= 1.5 cm not described below (TR2): 0   _________________________________________________________   No discrete nodules are seen within the thyroid gland.   IMPRESSION: Small heterogeneous thyroid gland. Imaging appearance is consistent with chronic thyroid hormone replacement therapy in the setting of hypothyroidism.   No thyroid nodules are visualized.     Electronically Signed   By: Malachy Moan M.D.   On: 03/14/2022 10:57   Latest Reference Range & Units 05/23/22 00:00 06/02/22 00:00 08/30/22 00:00 12/14/22 00:00 05/10/23 00:00 09/12/23  00:00  TSH 0.41 - 5.90  122.00 ! (C) (E) 100.00 ! (E) 0.17 ! (E) 0.07 ! (E) 0.19 ! (E) 0.22 ! (E)  !: Data is abnormal (C): Corrected (E): External lab result   Assessment & Plan:   ASSESSMENT / PLAN:  1. Hypothyroidism-acquired  Patient with long-standing hypothyroidism, on levothyroxine therapy. On physical exam, patient  does not have gross goiter, thyroid nodules, or neck compression symptoms.  Her antibody testing was negative, ruling out autoimmune etiology.    Her previsit thyroid function tests are consistent with appropriate hormone replacement.  TSH still slightly suppressed but Free T4 normal and she has no symptoms of over-replacement.  She is advised to continue Levothyroxine 100 mcg po daily before breakfast.  Will recheck TFTs prior to next visit and adjust dose accordingly.  - We discussed about correct intake of levothyroxine, at fasting, with water, separated by at least 30 minutes from breakfast, and separated by more than 4 hours from calcium, iron, multivitamins, acid reflux medications (PPIs). -Patient is made aware of the fact that thyroid hormone replacement is needed for life, dose to be adjusted by periodic monitoring of thyroid function tests.  -She recently had thyroid ultrasound on 03/14/22 showing small thyroid gland and no suspicious nodularities.    I spent  30  minutes in the care of the patient today including review of labs from Thyroid Function, CMP, and other relevant labs ; imaging/biopsy records (current and previous including abstractions from other facilities); face-to-face time discussing  her lab results and symptoms, medications doses, her options of short and long term treatment based on the latest standards of care / guidelines;   and documenting the encounter.  Isaac Bliss  participated in the discussions, expressed understanding, and voiced agreement with the above plans.  All questions were answered to her satisfaction. she is encouraged to  contact clinic should she have any questions or concerns prior to her return visit.   FOLLOW UP PLAN:  Return in about 6 months (around 03/17/2024) for Thyroid follow up, Previsit labs.  Ronny Bacon, [facility] [address] [place], [place] [address] Phone: [phone] Fax: [phone]  09/17/2023, 1:37 PM

## 2023-09-21 ENCOUNTER — Ambulatory Visit (HOSPITAL_COMMUNITY)
Admission: RE | Admit: 2023-09-21 | Discharge: 2023-09-21 | Disposition: A | Discharge status: Home / Self Care | Payer: Medicare HMO | Source: Ambulatory Visit | Attending: Student | Admitting: Student

## 2023-09-21 DIAGNOSIS — D1803 Hemangioma of intra-abdominal structures: Secondary | ICD-10-CM | POA: Diagnosis not present

## 2023-09-21 DIAGNOSIS — R911 Solitary pulmonary nodule: Secondary | ICD-10-CM | POA: Diagnosis not present

## 2023-09-21 DIAGNOSIS — R918 Other nonspecific abnormal finding of lung field: Secondary | ICD-10-CM | POA: Diagnosis not present

## 2023-09-21 DIAGNOSIS — J984 Other disorders of lung: Secondary | ICD-10-CM | POA: Diagnosis not present

## 2023-10-12 DIAGNOSIS — R2689 Other abnormalities of gait and mobility: Secondary | ICD-10-CM | POA: Diagnosis not present

## 2023-10-12 DIAGNOSIS — R296 Repeated falls: Secondary | ICD-10-CM | POA: Diagnosis not present

## 2023-10-12 DIAGNOSIS — D649 Anemia, unspecified: Secondary | ICD-10-CM | POA: Diagnosis not present

## 2023-10-13 DIAGNOSIS — D649 Anemia, unspecified: Secondary | ICD-10-CM | POA: Diagnosis not present

## 2023-10-13 DIAGNOSIS — R2689 Other abnormalities of gait and mobility: Secondary | ICD-10-CM | POA: Diagnosis not present

## 2023-10-13 DIAGNOSIS — R296 Repeated falls: Secondary | ICD-10-CM | POA: Diagnosis not present

## 2023-10-15 DIAGNOSIS — L602 Onychogryphosis: Secondary | ICD-10-CM | POA: Diagnosis not present

## 2023-10-15 DIAGNOSIS — D649 Anemia, unspecified: Secondary | ICD-10-CM | POA: Diagnosis not present

## 2023-10-15 DIAGNOSIS — R2689 Other abnormalities of gait and mobility: Secondary | ICD-10-CM | POA: Diagnosis not present

## 2023-10-15 DIAGNOSIS — I739 Peripheral vascular disease, unspecified: Secondary | ICD-10-CM | POA: Diagnosis not present

## 2023-10-15 DIAGNOSIS — R296 Repeated falls: Secondary | ICD-10-CM | POA: Diagnosis not present

## 2023-10-15 DIAGNOSIS — L603 Nail dystrophy: Secondary | ICD-10-CM | POA: Diagnosis not present

## 2023-10-16 DIAGNOSIS — R2689 Other abnormalities of gait and mobility: Secondary | ICD-10-CM | POA: Diagnosis not present

## 2023-10-16 DIAGNOSIS — D649 Anemia, unspecified: Secondary | ICD-10-CM | POA: Diagnosis not present

## 2023-10-16 DIAGNOSIS — R296 Repeated falls: Secondary | ICD-10-CM | POA: Diagnosis not present

## 2023-10-17 DIAGNOSIS — R296 Repeated falls: Secondary | ICD-10-CM | POA: Diagnosis not present

## 2023-10-17 DIAGNOSIS — D649 Anemia, unspecified: Secondary | ICD-10-CM | POA: Diagnosis not present

## 2023-10-17 DIAGNOSIS — R2689 Other abnormalities of gait and mobility: Secondary | ICD-10-CM | POA: Diagnosis not present

## 2023-10-18 DIAGNOSIS — D649 Anemia, unspecified: Secondary | ICD-10-CM | POA: Diagnosis not present

## 2023-10-18 DIAGNOSIS — R296 Repeated falls: Secondary | ICD-10-CM | POA: Diagnosis not present

## 2023-10-18 DIAGNOSIS — R2689 Other abnormalities of gait and mobility: Secondary | ICD-10-CM | POA: Diagnosis not present

## 2023-10-19 DIAGNOSIS — R2689 Other abnormalities of gait and mobility: Secondary | ICD-10-CM | POA: Diagnosis not present

## 2023-10-19 DIAGNOSIS — R296 Repeated falls: Secondary | ICD-10-CM | POA: Diagnosis not present

## 2023-10-19 DIAGNOSIS — D649 Anemia, unspecified: Secondary | ICD-10-CM | POA: Diagnosis not present

## 2023-10-22 DIAGNOSIS — R296 Repeated falls: Secondary | ICD-10-CM | POA: Diagnosis not present

## 2023-10-22 DIAGNOSIS — D649 Anemia, unspecified: Secondary | ICD-10-CM | POA: Diagnosis not present

## 2023-10-22 DIAGNOSIS — R2689 Other abnormalities of gait and mobility: Secondary | ICD-10-CM | POA: Diagnosis not present

## 2023-10-23 DIAGNOSIS — R296 Repeated falls: Secondary | ICD-10-CM | POA: Diagnosis not present

## 2023-10-23 DIAGNOSIS — D649 Anemia, unspecified: Secondary | ICD-10-CM | POA: Diagnosis not present

## 2023-10-23 DIAGNOSIS — R2689 Other abnormalities of gait and mobility: Secondary | ICD-10-CM | POA: Diagnosis not present

## 2023-10-24 DIAGNOSIS — R2689 Other abnormalities of gait and mobility: Secondary | ICD-10-CM | POA: Diagnosis not present

## 2023-10-24 DIAGNOSIS — D649 Anemia, unspecified: Secondary | ICD-10-CM | POA: Diagnosis not present

## 2023-10-24 DIAGNOSIS — R296 Repeated falls: Secondary | ICD-10-CM | POA: Diagnosis not present

## 2023-10-25 DIAGNOSIS — D649 Anemia, unspecified: Secondary | ICD-10-CM | POA: Diagnosis not present

## 2023-10-25 DIAGNOSIS — R2689 Other abnormalities of gait and mobility: Secondary | ICD-10-CM | POA: Diagnosis not present

## 2023-10-25 DIAGNOSIS — R296 Repeated falls: Secondary | ICD-10-CM | POA: Diagnosis not present

## 2023-10-26 DIAGNOSIS — Z7189 Other specified counseling: Secondary | ICD-10-CM | POA: Diagnosis not present

## 2023-10-26 DIAGNOSIS — I739 Peripheral vascular disease, unspecified: Secondary | ICD-10-CM | POA: Diagnosis not present

## 2023-10-26 DIAGNOSIS — N1832 Chronic kidney disease, stage 3b: Secondary | ICD-10-CM | POA: Diagnosis not present

## 2023-10-26 DIAGNOSIS — R2689 Other abnormalities of gait and mobility: Secondary | ICD-10-CM | POA: Diagnosis not present

## 2023-10-26 DIAGNOSIS — R296 Repeated falls: Secondary | ICD-10-CM | POA: Diagnosis not present

## 2023-10-26 DIAGNOSIS — Z79899 Other long term (current) drug therapy: Secondary | ICD-10-CM | POA: Diagnosis not present

## 2023-10-26 DIAGNOSIS — I10 Essential (primary) hypertension: Secondary | ICD-10-CM | POA: Diagnosis not present

## 2023-10-26 DIAGNOSIS — D649 Anemia, unspecified: Secondary | ICD-10-CM | POA: Diagnosis not present

## 2023-10-28 DIAGNOSIS — R2689 Other abnormalities of gait and mobility: Secondary | ICD-10-CM | POA: Diagnosis not present

## 2023-10-28 DIAGNOSIS — R296 Repeated falls: Secondary | ICD-10-CM | POA: Diagnosis not present

## 2023-10-28 DIAGNOSIS — D649 Anemia, unspecified: Secondary | ICD-10-CM | POA: Diagnosis not present

## 2023-10-29 DIAGNOSIS — D649 Anemia, unspecified: Secondary | ICD-10-CM | POA: Diagnosis not present

## 2023-10-29 DIAGNOSIS — R2689 Other abnormalities of gait and mobility: Secondary | ICD-10-CM | POA: Diagnosis not present

## 2023-10-29 DIAGNOSIS — Z79899 Other long term (current) drug therapy: Secondary | ICD-10-CM | POA: Diagnosis not present

## 2023-10-29 DIAGNOSIS — R296 Repeated falls: Secondary | ICD-10-CM | POA: Diagnosis not present

## 2023-10-31 DIAGNOSIS — R2689 Other abnormalities of gait and mobility: Secondary | ICD-10-CM | POA: Diagnosis not present

## 2023-10-31 DIAGNOSIS — R296 Repeated falls: Secondary | ICD-10-CM | POA: Diagnosis not present

## 2023-10-31 DIAGNOSIS — D649 Anemia, unspecified: Secondary | ICD-10-CM | POA: Diagnosis not present

## 2023-11-01 DIAGNOSIS — D649 Anemia, unspecified: Secondary | ICD-10-CM | POA: Diagnosis not present

## 2023-11-01 DIAGNOSIS — R2689 Other abnormalities of gait and mobility: Secondary | ICD-10-CM | POA: Diagnosis not present

## 2023-11-01 DIAGNOSIS — R296 Repeated falls: Secondary | ICD-10-CM | POA: Diagnosis not present

## 2023-11-04 DIAGNOSIS — R296 Repeated falls: Secondary | ICD-10-CM | POA: Diagnosis not present

## 2023-11-04 DIAGNOSIS — R2689 Other abnormalities of gait and mobility: Secondary | ICD-10-CM | POA: Diagnosis not present

## 2023-11-04 DIAGNOSIS — D649 Anemia, unspecified: Secondary | ICD-10-CM | POA: Diagnosis not present

## 2023-11-05 DIAGNOSIS — R296 Repeated falls: Secondary | ICD-10-CM | POA: Diagnosis not present

## 2023-11-05 DIAGNOSIS — R2689 Other abnormalities of gait and mobility: Secondary | ICD-10-CM | POA: Diagnosis not present

## 2023-11-05 DIAGNOSIS — D649 Anemia, unspecified: Secondary | ICD-10-CM | POA: Diagnosis not present

## 2023-11-06 DIAGNOSIS — D649 Anemia, unspecified: Secondary | ICD-10-CM | POA: Diagnosis not present

## 2023-11-06 DIAGNOSIS — R296 Repeated falls: Secondary | ICD-10-CM | POA: Diagnosis not present

## 2023-11-06 DIAGNOSIS — R2689 Other abnormalities of gait and mobility: Secondary | ICD-10-CM | POA: Diagnosis not present

## 2023-11-07 DIAGNOSIS — R2689 Other abnormalities of gait and mobility: Secondary | ICD-10-CM | POA: Diagnosis not present

## 2023-11-07 DIAGNOSIS — R296 Repeated falls: Secondary | ICD-10-CM | POA: Diagnosis not present

## 2023-11-07 DIAGNOSIS — D649 Anemia, unspecified: Secondary | ICD-10-CM | POA: Diagnosis not present

## 2023-11-10 DIAGNOSIS — R296 Repeated falls: Secondary | ICD-10-CM | POA: Diagnosis not present

## 2023-11-10 DIAGNOSIS — R2689 Other abnormalities of gait and mobility: Secondary | ICD-10-CM | POA: Diagnosis not present

## 2023-11-10 DIAGNOSIS — D649 Anemia, unspecified: Secondary | ICD-10-CM | POA: Diagnosis not present

## 2023-11-12 DIAGNOSIS — R2689 Other abnormalities of gait and mobility: Secondary | ICD-10-CM | POA: Diagnosis not present

## 2023-11-12 DIAGNOSIS — D649 Anemia, unspecified: Secondary | ICD-10-CM | POA: Diagnosis not present

## 2023-11-12 DIAGNOSIS — R296 Repeated falls: Secondary | ICD-10-CM | POA: Diagnosis not present

## 2023-11-13 DIAGNOSIS — R2689 Other abnormalities of gait and mobility: Secondary | ICD-10-CM | POA: Diagnosis not present

## 2023-11-13 DIAGNOSIS — D649 Anemia, unspecified: Secondary | ICD-10-CM | POA: Diagnosis not present

## 2023-11-13 DIAGNOSIS — R296 Repeated falls: Secondary | ICD-10-CM | POA: Diagnosis not present

## 2023-11-14 DIAGNOSIS — D649 Anemia, unspecified: Secondary | ICD-10-CM | POA: Diagnosis not present

## 2023-11-14 DIAGNOSIS — R296 Repeated falls: Secondary | ICD-10-CM | POA: Diagnosis not present

## 2023-11-14 DIAGNOSIS — R2689 Other abnormalities of gait and mobility: Secondary | ICD-10-CM | POA: Diagnosis not present

## 2023-11-15 DIAGNOSIS — D649 Anemia, unspecified: Secondary | ICD-10-CM | POA: Diagnosis not present

## 2023-11-15 DIAGNOSIS — R2689 Other abnormalities of gait and mobility: Secondary | ICD-10-CM | POA: Diagnosis not present

## 2023-11-15 DIAGNOSIS — R296 Repeated falls: Secondary | ICD-10-CM | POA: Diagnosis not present

## 2023-11-16 DIAGNOSIS — R296 Repeated falls: Secondary | ICD-10-CM | POA: Diagnosis not present

## 2023-11-16 DIAGNOSIS — D649 Anemia, unspecified: Secondary | ICD-10-CM | POA: Diagnosis not present

## 2023-11-16 DIAGNOSIS — R2689 Other abnormalities of gait and mobility: Secondary | ICD-10-CM | POA: Diagnosis not present

## 2023-11-19 DIAGNOSIS — R296 Repeated falls: Secondary | ICD-10-CM | POA: Diagnosis not present

## 2023-11-19 DIAGNOSIS — D649 Anemia, unspecified: Secondary | ICD-10-CM | POA: Diagnosis not present

## 2023-11-19 DIAGNOSIS — R2689 Other abnormalities of gait and mobility: Secondary | ICD-10-CM | POA: Diagnosis not present

## 2023-11-20 DIAGNOSIS — D649 Anemia, unspecified: Secondary | ICD-10-CM | POA: Diagnosis not present

## 2023-11-20 DIAGNOSIS — R2689 Other abnormalities of gait and mobility: Secondary | ICD-10-CM | POA: Diagnosis not present

## 2023-11-20 DIAGNOSIS — R296 Repeated falls: Secondary | ICD-10-CM | POA: Diagnosis not present

## 2023-11-21 DIAGNOSIS — R2689 Other abnormalities of gait and mobility: Secondary | ICD-10-CM | POA: Diagnosis not present

## 2023-11-21 DIAGNOSIS — D649 Anemia, unspecified: Secondary | ICD-10-CM | POA: Diagnosis not present

## 2023-11-21 DIAGNOSIS — R296 Repeated falls: Secondary | ICD-10-CM | POA: Diagnosis not present

## 2023-11-22 DIAGNOSIS — R296 Repeated falls: Secondary | ICD-10-CM | POA: Diagnosis not present

## 2023-11-22 DIAGNOSIS — D649 Anemia, unspecified: Secondary | ICD-10-CM | POA: Diagnosis not present

## 2023-11-22 DIAGNOSIS — R2689 Other abnormalities of gait and mobility: Secondary | ICD-10-CM | POA: Diagnosis not present

## 2023-11-23 DIAGNOSIS — R296 Repeated falls: Secondary | ICD-10-CM | POA: Diagnosis not present

## 2023-11-23 DIAGNOSIS — R2689 Other abnormalities of gait and mobility: Secondary | ICD-10-CM | POA: Diagnosis not present

## 2023-11-23 DIAGNOSIS — D649 Anemia, unspecified: Secondary | ICD-10-CM | POA: Diagnosis not present

## 2023-11-26 DIAGNOSIS — D649 Anemia, unspecified: Secondary | ICD-10-CM | POA: Diagnosis not present

## 2023-11-26 DIAGNOSIS — R296 Repeated falls: Secondary | ICD-10-CM | POA: Diagnosis not present

## 2023-11-26 DIAGNOSIS — R2689 Other abnormalities of gait and mobility: Secondary | ICD-10-CM | POA: Diagnosis not present

## 2023-11-27 DIAGNOSIS — D649 Anemia, unspecified: Secondary | ICD-10-CM | POA: Diagnosis not present

## 2023-11-27 DIAGNOSIS — R296 Repeated falls: Secondary | ICD-10-CM | POA: Diagnosis not present

## 2023-11-27 DIAGNOSIS — R2689 Other abnormalities of gait and mobility: Secondary | ICD-10-CM | POA: Diagnosis not present

## 2023-11-28 DIAGNOSIS — R296 Repeated falls: Secondary | ICD-10-CM | POA: Diagnosis not present

## 2023-11-28 DIAGNOSIS — Z8673 Personal history of transient ischemic attack (TIA), and cerebral infarction without residual deficits: Secondary | ICD-10-CM | POA: Diagnosis not present

## 2023-11-28 DIAGNOSIS — I10 Essential (primary) hypertension: Secondary | ICD-10-CM | POA: Diagnosis not present

## 2023-11-28 DIAGNOSIS — E785 Hyperlipidemia, unspecified: Secondary | ICD-10-CM | POA: Diagnosis not present

## 2023-11-28 DIAGNOSIS — F1021 Alcohol dependence, in remission: Secondary | ICD-10-CM | POA: Diagnosis not present

## 2023-11-28 DIAGNOSIS — E039 Hypothyroidism, unspecified: Secondary | ICD-10-CM | POA: Diagnosis not present

## 2023-11-28 DIAGNOSIS — I739 Peripheral vascular disease, unspecified: Secondary | ICD-10-CM | POA: Diagnosis not present

## 2023-11-28 DIAGNOSIS — F419 Anxiety disorder, unspecified: Secondary | ICD-10-CM | POA: Diagnosis not present

## 2023-11-28 DIAGNOSIS — R2689 Other abnormalities of gait and mobility: Secondary | ICD-10-CM | POA: Diagnosis not present

## 2023-11-28 DIAGNOSIS — E538 Deficiency of other specified B group vitamins: Secondary | ICD-10-CM | POA: Diagnosis not present

## 2023-11-28 DIAGNOSIS — D649 Anemia, unspecified: Secondary | ICD-10-CM | POA: Diagnosis not present

## 2023-11-29 DIAGNOSIS — D649 Anemia, unspecified: Secondary | ICD-10-CM | POA: Diagnosis not present

## 2023-11-29 DIAGNOSIS — R2689 Other abnormalities of gait and mobility: Secondary | ICD-10-CM | POA: Diagnosis not present

## 2023-11-29 DIAGNOSIS — R296 Repeated falls: Secondary | ICD-10-CM | POA: Diagnosis not present

## 2023-11-30 DIAGNOSIS — R296 Repeated falls: Secondary | ICD-10-CM | POA: Diagnosis not present

## 2023-11-30 DIAGNOSIS — R2689 Other abnormalities of gait and mobility: Secondary | ICD-10-CM | POA: Diagnosis not present

## 2023-11-30 DIAGNOSIS — D649 Anemia, unspecified: Secondary | ICD-10-CM | POA: Diagnosis not present

## 2023-12-20 DIAGNOSIS — E039 Hypothyroidism, unspecified: Secondary | ICD-10-CM | POA: Diagnosis not present

## 2023-12-20 DIAGNOSIS — I10 Essential (primary) hypertension: Secondary | ICD-10-CM | POA: Diagnosis not present

## 2023-12-20 DIAGNOSIS — Z79899 Other long term (current) drug therapy: Secondary | ICD-10-CM | POA: Diagnosis not present

## 2023-12-20 DIAGNOSIS — I739 Peripheral vascular disease, unspecified: Secondary | ICD-10-CM | POA: Diagnosis not present

## 2024-01-01 DIAGNOSIS — D649 Anemia, unspecified: Secondary | ICD-10-CM | POA: Diagnosis not present

## 2024-01-08 DIAGNOSIS — D649 Anemia, unspecified: Secondary | ICD-10-CM | POA: Diagnosis not present

## 2024-01-08 DIAGNOSIS — M6281 Muscle weakness (generalized): Secondary | ICD-10-CM | POA: Diagnosis not present

## 2024-01-09 DIAGNOSIS — D649 Anemia, unspecified: Secondary | ICD-10-CM | POA: Diagnosis not present

## 2024-01-09 DIAGNOSIS — M6281 Muscle weakness (generalized): Secondary | ICD-10-CM | POA: Diagnosis not present

## 2024-01-09 DIAGNOSIS — Z79899 Other long term (current) drug therapy: Secondary | ICD-10-CM | POA: Diagnosis not present

## 2024-01-09 DIAGNOSIS — R058 Other specified cough: Secondary | ICD-10-CM | POA: Diagnosis not present

## 2024-01-09 DIAGNOSIS — R0989 Other specified symptoms and signs involving the circulatory and respiratory systems: Secondary | ICD-10-CM | POA: Diagnosis not present

## 2024-01-10 DIAGNOSIS — M6281 Muscle weakness (generalized): Secondary | ICD-10-CM | POA: Diagnosis not present

## 2024-01-10 DIAGNOSIS — D649 Anemia, unspecified: Secondary | ICD-10-CM | POA: Diagnosis not present

## 2024-01-11 DIAGNOSIS — M6281 Muscle weakness (generalized): Secondary | ICD-10-CM | POA: Diagnosis not present

## 2024-01-11 DIAGNOSIS — D649 Anemia, unspecified: Secondary | ICD-10-CM | POA: Diagnosis not present

## 2024-01-12 DIAGNOSIS — R0989 Other specified symptoms and signs involving the circulatory and respiratory systems: Secondary | ICD-10-CM | POA: Diagnosis not present

## 2024-01-12 DIAGNOSIS — R059 Cough, unspecified: Secondary | ICD-10-CM | POA: Diagnosis not present

## 2024-01-14 DIAGNOSIS — D649 Anemia, unspecified: Secondary | ICD-10-CM | POA: Diagnosis not present

## 2024-01-14 DIAGNOSIS — M6281 Muscle weakness (generalized): Secondary | ICD-10-CM | POA: Diagnosis not present

## 2024-01-16 DIAGNOSIS — M6281 Muscle weakness (generalized): Secondary | ICD-10-CM | POA: Diagnosis not present

## 2024-01-16 DIAGNOSIS — D649 Anemia, unspecified: Secondary | ICD-10-CM | POA: Diagnosis not present

## 2024-01-29 DIAGNOSIS — I10 Essential (primary) hypertension: Secondary | ICD-10-CM | POA: Diagnosis not present

## 2024-01-29 DIAGNOSIS — I739 Peripheral vascular disease, unspecified: Secondary | ICD-10-CM | POA: Diagnosis not present

## 2024-01-29 DIAGNOSIS — E039 Hypothyroidism, unspecified: Secondary | ICD-10-CM | POA: Diagnosis not present

## 2024-01-29 DIAGNOSIS — E785 Hyperlipidemia, unspecified: Secondary | ICD-10-CM | POA: Diagnosis not present

## 2024-01-29 DIAGNOSIS — Z79899 Other long term (current) drug therapy: Secondary | ICD-10-CM | POA: Diagnosis not present

## 2024-02-05 DIAGNOSIS — M6281 Muscle weakness (generalized): Secondary | ICD-10-CM | POA: Diagnosis not present

## 2024-02-05 DIAGNOSIS — E039 Hypothyroidism, unspecified: Secondary | ICD-10-CM | POA: Diagnosis not present

## 2024-02-05 DIAGNOSIS — D649 Anemia, unspecified: Secondary | ICD-10-CM | POA: Diagnosis not present

## 2024-02-05 DIAGNOSIS — R627 Adult failure to thrive: Secondary | ICD-10-CM | POA: Diagnosis not present

## 2024-02-07 DIAGNOSIS — Z79899 Other long term (current) drug therapy: Secondary | ICD-10-CM | POA: Diagnosis not present

## 2024-02-07 DIAGNOSIS — E039 Hypothyroidism, unspecified: Secondary | ICD-10-CM | POA: Diagnosis not present

## 2024-02-13 DIAGNOSIS — I739 Peripheral vascular disease, unspecified: Secondary | ICD-10-CM | POA: Diagnosis not present

## 2024-02-13 DIAGNOSIS — Z8673 Personal history of transient ischemic attack (TIA), and cerebral infarction without residual deficits: Secondary | ICD-10-CM | POA: Diagnosis not present

## 2024-02-13 DIAGNOSIS — E039 Hypothyroidism, unspecified: Secondary | ICD-10-CM | POA: Diagnosis not present

## 2024-02-13 DIAGNOSIS — D649 Anemia, unspecified: Secondary | ICD-10-CM | POA: Diagnosis not present

## 2024-02-13 DIAGNOSIS — E538 Deficiency of other specified B group vitamins: Secondary | ICD-10-CM | POA: Diagnosis not present

## 2024-02-13 DIAGNOSIS — E785 Hyperlipidemia, unspecified: Secondary | ICD-10-CM | POA: Diagnosis not present

## 2024-02-13 DIAGNOSIS — I10 Essential (primary) hypertension: Secondary | ICD-10-CM | POA: Diagnosis not present

## 2024-02-13 DIAGNOSIS — F419 Anxiety disorder, unspecified: Secondary | ICD-10-CM | POA: Diagnosis not present

## 2024-02-13 DIAGNOSIS — N1832 Chronic kidney disease, stage 3b: Secondary | ICD-10-CM | POA: Diagnosis not present

## 2024-03-11 DIAGNOSIS — E782 Mixed hyperlipidemia: Secondary | ICD-10-CM | POA: Diagnosis not present

## 2024-03-11 DIAGNOSIS — E039 Hypothyroidism, unspecified: Secondary | ICD-10-CM | POA: Diagnosis not present

## 2024-03-12 LAB — LAB REPORT - SCANNED
Free T4: 1.65 ng/dL
TSH: 0.87 (ref 0.41–5.90)

## 2024-03-13 DIAGNOSIS — N1832 Chronic kidney disease, stage 3b: Secondary | ICD-10-CM | POA: Diagnosis not present

## 2024-03-13 DIAGNOSIS — E039 Hypothyroidism, unspecified: Secondary | ICD-10-CM | POA: Diagnosis not present

## 2024-03-13 DIAGNOSIS — Z79899 Other long term (current) drug therapy: Secondary | ICD-10-CM | POA: Diagnosis not present

## 2024-03-13 DIAGNOSIS — R627 Adult failure to thrive: Secondary | ICD-10-CM | POA: Diagnosis not present

## 2024-03-13 DIAGNOSIS — F419 Anxiety disorder, unspecified: Secondary | ICD-10-CM | POA: Diagnosis not present

## 2024-03-13 DIAGNOSIS — I10 Essential (primary) hypertension: Secondary | ICD-10-CM | POA: Diagnosis not present

## 2024-03-19 ENCOUNTER — Ambulatory Visit: Payer: Medicare HMO | Admitting: Nurse Practitioner

## 2024-03-19 DIAGNOSIS — E039 Hypothyroidism, unspecified: Secondary | ICD-10-CM

## 2024-04-07 ENCOUNTER — Ambulatory Visit: Admitting: Family Medicine

## 2024-04-11 DIAGNOSIS — D649 Anemia, unspecified: Secondary | ICD-10-CM | POA: Diagnosis not present

## 2024-04-24 DIAGNOSIS — M6281 Muscle weakness (generalized): Secondary | ICD-10-CM | POA: Diagnosis not present

## 2024-04-24 DIAGNOSIS — R627 Adult failure to thrive: Secondary | ICD-10-CM | POA: Diagnosis not present

## 2024-04-24 DIAGNOSIS — D649 Anemia, unspecified: Secondary | ICD-10-CM | POA: Diagnosis not present

## 2024-04-30 DIAGNOSIS — D649 Anemia, unspecified: Secondary | ICD-10-CM | POA: Diagnosis not present

## 2024-04-30 DIAGNOSIS — F1021 Alcohol dependence, in remission: Secondary | ICD-10-CM | POA: Diagnosis not present

## 2024-04-30 DIAGNOSIS — E785 Hyperlipidemia, unspecified: Secondary | ICD-10-CM | POA: Diagnosis not present

## 2024-04-30 DIAGNOSIS — F419 Anxiety disorder, unspecified: Secondary | ICD-10-CM | POA: Diagnosis not present

## 2024-04-30 DIAGNOSIS — E039 Hypothyroidism, unspecified: Secondary | ICD-10-CM | POA: Diagnosis not present

## 2024-04-30 DIAGNOSIS — E538 Deficiency of other specified B group vitamins: Secondary | ICD-10-CM | POA: Diagnosis not present

## 2024-04-30 DIAGNOSIS — I10 Essential (primary) hypertension: Secondary | ICD-10-CM | POA: Diagnosis not present

## 2024-04-30 DIAGNOSIS — N1832 Chronic kidney disease, stage 3b: Secondary | ICD-10-CM | POA: Diagnosis not present

## 2024-04-30 DIAGNOSIS — I739 Peripheral vascular disease, unspecified: Secondary | ICD-10-CM | POA: Diagnosis not present

## 2024-05-08 DIAGNOSIS — M6281 Muscle weakness (generalized): Secondary | ICD-10-CM | POA: Diagnosis not present

## 2024-05-08 DIAGNOSIS — D649 Anemia, unspecified: Secondary | ICD-10-CM | POA: Diagnosis not present

## 2024-05-29 DIAGNOSIS — Z79899 Other long term (current) drug therapy: Secondary | ICD-10-CM | POA: Diagnosis not present

## 2024-05-29 DIAGNOSIS — N1832 Chronic kidney disease, stage 3b: Secondary | ICD-10-CM | POA: Diagnosis not present

## 2024-05-29 DIAGNOSIS — E785 Hyperlipidemia, unspecified: Secondary | ICD-10-CM | POA: Diagnosis not present

## 2024-05-29 DIAGNOSIS — I739 Peripheral vascular disease, unspecified: Secondary | ICD-10-CM | POA: Diagnosis not present

## 2024-05-29 DIAGNOSIS — I10 Essential (primary) hypertension: Secondary | ICD-10-CM | POA: Diagnosis not present

## 2024-05-29 DIAGNOSIS — E039 Hypothyroidism, unspecified: Secondary | ICD-10-CM | POA: Diagnosis not present

## 2024-06-05 DIAGNOSIS — I10 Essential (primary) hypertension: Secondary | ICD-10-CM | POA: Diagnosis not present

## 2024-06-05 DIAGNOSIS — E538 Deficiency of other specified B group vitamins: Secondary | ICD-10-CM | POA: Diagnosis not present

## 2024-06-05 DIAGNOSIS — I739 Peripheral vascular disease, unspecified: Secondary | ICD-10-CM | POA: Diagnosis not present

## 2024-06-05 DIAGNOSIS — F1021 Alcohol dependence, in remission: Secondary | ICD-10-CM | POA: Diagnosis not present

## 2024-06-05 DIAGNOSIS — E039 Hypothyroidism, unspecified: Secondary | ICD-10-CM | POA: Diagnosis not present

## 2024-06-05 DIAGNOSIS — Z8673 Personal history of transient ischemic attack (TIA), and cerebral infarction without residual deficits: Secondary | ICD-10-CM | POA: Diagnosis not present

## 2024-06-05 DIAGNOSIS — Z79899 Other long term (current) drug therapy: Secondary | ICD-10-CM | POA: Diagnosis not present

## 2024-06-05 DIAGNOSIS — E785 Hyperlipidemia, unspecified: Secondary | ICD-10-CM | POA: Diagnosis not present

## 2024-06-05 DIAGNOSIS — F419 Anxiety disorder, unspecified: Secondary | ICD-10-CM | POA: Diagnosis not present

## 2024-06-05 DIAGNOSIS — N1832 Chronic kidney disease, stage 3b: Secondary | ICD-10-CM | POA: Diagnosis not present

## 2024-06-05 DIAGNOSIS — R627 Adult failure to thrive: Secondary | ICD-10-CM | POA: Diagnosis not present

## 2024-06-05 DIAGNOSIS — D649 Anemia, unspecified: Secondary | ICD-10-CM | POA: Diagnosis not present

## 2024-06-06 DIAGNOSIS — Z8673 Personal history of transient ischemic attack (TIA), and cerebral infarction without residual deficits: Secondary | ICD-10-CM | POA: Diagnosis not present

## 2024-06-06 DIAGNOSIS — H3530 Unspecified macular degeneration: Secondary | ICD-10-CM | POA: Diagnosis not present

## 2024-06-06 DIAGNOSIS — M6281 Muscle weakness (generalized): Secondary | ICD-10-CM | POA: Diagnosis not present

## 2024-06-09 ENCOUNTER — Telehealth: Payer: Self-pay

## 2024-06-09 NOTE — Transitions of Care (Post Inpatient/ED Visit) (Signed)
   06/09/2024  Name: [name] MRN: [id number] DOB: 1946-08-08  Today's TOC FU Call Status: Today's TOC FU Call Status:: Unsuccessful Call (1st Attempt) Unsuccessful Call (1st Attempt) Date: 06/09/24  Attempted to reach the patient regarding the most recent Inpatient/ED visit.  Follow Up Plan: Additional outreach attempts will be made to reach the patient to complete the Transitions of Care (Post Inpatient/ED visit) call.   Signature  Charmaine Bloodgood, LPN [facility] Health Advisor [place] [facility] You Are. We Are. One [facility] Direct Dial [phone]

## 2024-06-19 ENCOUNTER — Ambulatory Visit (INDEPENDENT_AMBULATORY_CARE_PROVIDER_SITE_OTHER): Admitting: Family Medicine

## 2024-06-19 VITALS — BP 168/83 | HR 67 | Temp 97.0°F | Ht 66.5 in | Wt 171.0 lb

## 2024-06-19 DIAGNOSIS — I10 Essential (primary) hypertension: Secondary | ICD-10-CM | POA: Diagnosis not present

## 2024-06-19 DIAGNOSIS — E782 Mixed hyperlipidemia: Secondary | ICD-10-CM | POA: Diagnosis not present

## 2024-06-19 DIAGNOSIS — E039 Hypothyroidism, unspecified: Secondary | ICD-10-CM

## 2024-06-19 MED ORDER — ATORVASTATIN CALCIUM 20 MG PO TABS: 20.0000 mg | ORAL_TABLET | Freq: Every day | ORAL | 1 refills | Status: DC

## 2024-06-19 MED ORDER — LEVOTHYROXINE SODIUM 100 MCG PO TABS: 100.0000 ug | ORAL_TABLET | Freq: Every day | ORAL | 3 refills | Status: DC

## 2024-06-19 MED ORDER — OLMESARTAN MEDOXOMIL 20 MG PO TABS: 20.0000 mg | ORAL_TABLET | Freq: Every day | ORAL | 1 refills | Status: AC

## 2024-06-19 NOTE — Progress Notes (Signed)
 Subjective:  Patient ID: [name], female    DOB: 19-Dec-1945  Age: 78 y.o. MRN: [id number]  CC: Medical Management of Chronic Issues (No concerns jut following up after getting out of [facility] two weeks ago. )   HPI [name] presents for recent DC from ALF back to her own apartment. Lost her husband recently while living in the rehab. She was there due to toxic reaction to cipro .    follow-up on  thyroid . The patient has a history of hypothyroidism for many years. It has been stable recently. Pt. denies any change in  voice, loss of hair, heat or cold intolerance. Energy level has been adequate to good. Patient denies constipation and diarrhea. No myxedema. Medication is as noted below. Verified that pt is taking it daily on an empty stomach. Well tolerated.   in for follow-up of elevated cholesterol. Doing well without complaints on current medication. Denies side effects of statin including myalgia and arthralgia and nausea. Currently no chest pain, shortness of breath or other cardiovascular related symptoms noted.   presents for  follow-up of hypertension. Patient has no history of headache chest pain or shortness of breath or recent cough. Patient also denies symptoms of TIA such as focal numbness or weakness. Patient denies side effects from medication. States taking it regularly.       06/19/2024   11:35 AM 11/28/2021    1:26 PM 09/21/2021    3:16 PM  Depression screen PHQ 2/9  Decreased Interest 0 0 0  Down, Depressed, Hopeless 0 0 0  PHQ - 2 Score 0 0 0  Altered sleeping 0    Tired, decreased energy 0    Change in appetite 0    Feeling bad or failure about yourself  0    Trouble concentrating 0    Moving slowly or fidgety/restless 0    Suicidal thoughts 0    PHQ-9 Score 0    Difficult doing work/chores Not difficult at all      History [name] has a past medical history of Acute pyelonephritis (10/15/2019), Acute renal failure superimposed on stage 2  chronic kidney disease (HCC) (10/15/2019), Alcoholism (HCC) (01/09/2012), Anxiety, Arthritis, B12 deficiency, Carotid artery narrowing (10/03/2012), Chronic kidney disease (CKD), stage II (mild), Depression, Headache(784.0), Hyperlipidemia, Hypertension, Hyponatremia (01/09/2012), Hypothyroid, Incontinence of urine in female, Menopause, Mini stroke, Orthostatic syncope (10/02/2012), SBO (small bowel obstruction) (HCC) (06/28/2016), Stroke (HCC) (2010), and Thrombocytopenia (HCC) (10/15/2019).   She has a past surgical history that includes Appendectomy; Tonsillectomy; Cataract extraction w/PHACO (06/20/2012); Cataract extraction w/PHACO (07/11/2012); Colonoscopy (N/A, 12/17/2015); Esophagogastroduodenoscopy (N/A, 12/17/2015); Agile capsule (N/A, 02/22/2016); Knee arthroscopy (Right, 09/2016); Lumbar laminectomy/decompression microdiscectomy (N/A, 05/25/2017); and Total knee arthroplasty (Left, 10/31/2021).   Her family history includes GER disease in her mother; Hypertension in her maternal grandmother; Ulcers in her mother.She reports that she quit smoking about 32 years ago. Her smoking use included cigarettes. She started smoking about 62 years ago. She has a 90 pack-year smoking history. She has never used smokeless tobacco. She reports that she does not drink alcohol and does not use drugs.    ROS Review of Systems  Constitutional: Negative.   HENT: Negative.    Eyes:  Negative for visual disturbance.  Respiratory:  Negative for shortness of breath.   Cardiovascular:  Negative for chest pain.  Gastrointestinal:  Negative for abdominal pain.  Musculoskeletal:  Negative for arthralgias.    Objective:  BP (!) 168/83   Pulse 67   Temp (!) 97  F (36.1 C)   Ht 5' 6.5 (1.689 m)   Wt 171 lb (77.6 kg)   LMP 08/21/2013   SpO2 97%   BMI 27.19 kg/m   BP Readings from Last 3 Encounters:  06/19/24 (!) 168/83  09/17/23 (!) 140/82  05/17/23 125/69    Wt Readings from Last 3 Encounters:  06/19/24  171 lb (77.6 kg)  05/17/23 134 lb 9.6 oz (61.1 kg)  03/28/23 137 lb 6.4 oz (62.3 kg)     Physical Exam Constitutional:      General: She is not in acute distress.    Appearance: She is well-developed.  HENT:     Head: Normocephalic and atraumatic.  Eyes:     Conjunctiva/sclera: Conjunctivae normal.     Pupils: Pupils are equal, round, and reactive to light.  Neck:     Thyroid : No thyromegaly.  Cardiovascular:     Rate and Rhythm: Normal rate and regular rhythm.     Heart sounds: Normal heart sounds. No murmur heard. Pulmonary:     Effort: Pulmonary effort is normal. No respiratory distress.     Breath sounds: Normal breath sounds. No wheezing or rales.  Abdominal:     General: Bowel sounds are normal. There is no distension.     Palpations: Abdomen is soft.     Tenderness: There is no abdominal tenderness.  Musculoskeletal:        General: Normal range of motion.     Cervical back: Normal range of motion and neck supple.  Lymphadenopathy:     Cervical: No cervical adenopathy.  Skin:    General: Skin is warm and dry.  Neurological:     Mental Status: She is alert and oriented to person, place, and time.  Psychiatric:        Behavior: Behavior normal.        Thought Content: Thought content normal.        Judgment: Judgment normal.      Assessment & Plan:  Essential hypertension -     CMP14+EGFR -     Lipid panel -     Olmesartan  Medoxomil; Take 1 tablet (20 mg total) by mouth daily. For blood pressure  Dispense: 90 tablet; Refill: 1  Hypothyroidism, unspecified type -     Levothyroxine  Sodium; Take 1 tablet (100 mcg total) by mouth daily before breakfast.  Dispense: 90 tablet; Refill: 3 -     CBC with Differential/Platelet -     CMP14+EGFR -     Lipid panel -     TSH + free T4  Mixed hyperlipidemia -     Atorvastatin  Calcium ; Take 1 tablet (20 mg total) by mouth daily.  Dispense: 90 tablet; Refill: 1 -     Lipid panel     Follow-up: Return in about 1  month (around 07/20/2024) for hypertension, Hypothyroidism, cholesterol.  Butler Der, M.D.

## 2024-06-20 LAB — CBC WITH DIFFERENTIAL/PLATELET
Basophils Absolute: 0 x10E3/uL (ref 0.0–0.2)
Basos: 0 %
EOS (ABSOLUTE): 0.2 x10E3/uL (ref 0.0–0.4)
Eos: 2 %
Hematocrit: 41.3 % (ref 34.0–46.6)
Hemoglobin: 13.4 g/dL (ref 11.1–15.9)
Immature Grans (Abs): 0 x10E3/uL (ref 0.0–0.1)
Immature Granulocytes: 0 %
Lymphocytes Absolute: 1.5 x10E3/uL (ref 0.7–3.1)
Lymphs: 18 %
MCH: 29.5 pg (ref 26.6–33.0)
MCHC: 32.4 g/dL (ref 31.5–35.7)
MCV: 91 fL (ref 79–97)
Monocytes Absolute: 0.5 x10E3/uL (ref 0.1–0.9)
Monocytes: 6 %
Neutrophils Absolute: 6.1 x10E3/uL (ref 1.4–7.0)
Neutrophils: 74 %
Platelets: 271 x10E3/uL (ref 150–450)
RBC: 4.55 x10E6/uL (ref 3.77–5.28)
RDW: 12.1 % (ref 11.7–15.4)
WBC: 8.3 x10E3/uL (ref 3.4–10.8)

## 2024-06-20 LAB — CMP14+EGFR
ALT: 8 IU/L (ref 0–32)
AST: 15 IU/L (ref 0–40)
Albumin: 4.5 g/dL (ref 3.8–4.8)
Alkaline Phosphatase: 126 IU/L — ABNORMAL HIGH (ref 44–121)
BUN/Creatinine Ratio: 8 — ABNORMAL LOW (ref 12–28)
BUN: 9 mg/dL (ref 8–27)
Bilirubin Total: 0.7 mg/dL (ref 0.0–1.2)
CO2: 19 mmol/L — ABNORMAL LOW (ref 20–29)
Calcium: 9.2 mg/dL (ref 8.7–10.3)
Chloride: 105 mmol/L (ref 96–106)
Creatinine, Ser: 1.16 mg/dL — ABNORMAL HIGH (ref 0.57–1.00)
Globulin, Total: 2.8 g/dL (ref 1.5–4.5)
Glucose: 83 mg/dL (ref 70–99)
Potassium: 4.1 mmol/L (ref 3.5–5.2)
Sodium: 142 mmol/L (ref 134–144)
Total Protein: 7.3 g/dL (ref 6.0–8.5)
eGFR: 49 mL/min/1.73 — ABNORMAL LOW (ref >59)

## 2024-06-20 LAB — LIPID PANEL
Chol/HDL Ratio: 2.6 ratio (ref 0.0–4.4)
Cholesterol, Total: 124 mg/dL (ref 100–199)
HDL: 47 mg/dL (ref >39)
LDL Chol Calc (NIH): 57 mg/dL (ref 0–99)
Triglycerides: 112 mg/dL (ref 0–149)
VLDL Cholesterol Cal: 20 mg/dL (ref 5–40)

## 2024-06-20 LAB — TSH+FREE T4
Free T4: 1.83 ng/dL — ABNORMAL HIGH (ref 0.82–1.77)
TSH: 0.566 u[IU]/mL (ref 0.450–4.500)

## 2024-06-21 ENCOUNTER — Ambulatory Visit: Payer: Self-pay | Admitting: Family Medicine

## 2024-06-22 ENCOUNTER — Encounter: Payer: Self-pay | Admitting: Family Medicine

## 2024-07-24 ENCOUNTER — Ambulatory Visit (INDEPENDENT_AMBULATORY_CARE_PROVIDER_SITE_OTHER): Admitting: Family Medicine

## 2024-07-24 VITALS — BP 121/74 | HR 89 | Temp 97.7°F | Ht 66.0 in | Wt 160.0 lb

## 2024-07-24 DIAGNOSIS — I10 Essential (primary) hypertension: Secondary | ICD-10-CM

## 2024-07-24 DIAGNOSIS — N182 Chronic kidney disease, stage 2 (mild): Secondary | ICD-10-CM | POA: Diagnosis not present

## 2024-07-24 DIAGNOSIS — E038 Other specified hypothyroidism: Secondary | ICD-10-CM | POA: Diagnosis not present

## 2024-07-24 DIAGNOSIS — E782 Mixed hyperlipidemia: Secondary | ICD-10-CM | POA: Diagnosis not present

## 2024-07-27 NOTE — Progress Notes (Signed)
 Subjective:  Patient ID: [name], female    DOB: [birth year]  Age: 78 y.o. MRN: [id number]  CC: 1 MONTH FOLLOW UP   HPI  Discussed the use of AI scribe software for clinical note transcription with the patient, who gave verbal consent to proceed.  History of Present Illness   [name] is a 78 year old female who presents for follow-up after recent thyroid  and blood pressure evaluations.  She recently had a thyroid  blood test that showed a slightly elevated free T4 level, while her TSH remained within the normal range. No symptoms of hyperthyroidism such as diarrhea or heart palpitations, except for palpitations when visiting the doctor's office, which she attributes to nervousness.  She reported feeling nervous during the visit, which she thought might affect her blood pressure. No shortness of breath, coughing, or swelling. She experiences chest discomfort only when upset or irritated.  She has a history of mildly diminished kidney function, which has remained stable since before her time at [facility]. Her sodium levels, previously low, have returned to normal. She has had low sodium levels since childhood.  Socially, she has recently been released from [facility] and is adjusting to life outside. She feels frustrated with friends and acquaintances who offer unsolicited advice about her health and lifestyle. She maintains regular visits with friends at [facility] and values these relationships. She has lost two husbands and is not interested in remarrying, preferring to maintain friendships.             07/24/2024   12:54 PM 06/19/2024   11:35 AM 11/28/2021    1:26 PM  Depression screen PHQ 2/9  Decreased Interest 0 0 0  Down, Depressed, Hopeless 0 0 0  PHQ - 2 Score 0 0 0  Altered sleeping  0   Tired, decreased energy  0   Change in appetite  0   Feeling bad or failure about yourself   0   Trouble concentrating  0   Moving slowly or  fidgety/restless  0   Suicidal thoughts  0   PHQ-9 Score  0   Difficult doing work/chores  Not difficult at all     History [name] has a past medical history of Acute pyelonephritis (10/15/2019), Acute renal failure superimposed on stage 2 chronic kidney disease (HCC) (10/15/2019), Alcoholism (HCC) (01/09/2012), Anxiety, Arthritis, B12 deficiency, Carotid artery narrowing (10/03/2012), Chronic kidney disease (CKD), stage II (mild), Depression, Headache(784.0), Hyperlipidemia, Hypertension, Hyponatremia (01/09/2012), Hypothyroid, Incontinence of urine in female, Menopause, Mini stroke, Orthostatic syncope (10/02/2012), SBO (small bowel obstruction) (HCC) (06/28/2016), Stroke (HCC) (2010), and Thrombocytopenia (HCC) (10/15/2019).   She has a past surgical history that includes Appendectomy; Tonsillectomy; Cataract extraction w/PHACO (06/20/2012); Cataract extraction w/PHACO (07/11/2012); Colonoscopy (N/A, 12/17/2015); Esophagogastroduodenoscopy (N/A, 12/17/2015); Agile capsule (N/A, 02/22/2016); Knee arthroscopy (Right, 09/2016); Lumbar laminectomy/decompression microdiscectomy (N/A, 05/25/2017); and Total knee arthroplasty (Left, 10/31/2021).   Her family history includes GER disease in her mother; Hypertension in her maternal grandmother; Ulcers in her mother.She reports that she quit smoking about 32 years ago. Her smoking use included cigarettes. She started smoking about 62 years ago. She has a 90 pack-year smoking history. She has never used smokeless tobacco. She reports that she does not drink alcohol and does not use drugs.    ROS Review of Systems  Constitutional: Negative.   HENT:  Negative for congestion.   Eyes:  Negative for visual disturbance.  Respiratory:  Negative for shortness of breath.   Cardiovascular:  Negative for  chest pain.  Gastrointestinal:  Negative for abdominal pain, constipation, diarrhea, nausea and vomiting.  Genitourinary:  Negative for difficulty urinating.   Musculoskeletal:  Negative for arthralgias and myalgias.  Neurological:  Negative for headaches.  Psychiatric/Behavioral:  Negative for sleep disturbance.     Objective:  BP 121/74   Pulse 89   Temp 97.7 F (36.5 C)   Ht 5' 6 (1.676 m)   Wt 160 lb (72.6 kg)   LMP 08/21/2013   SpO2 100%   BMI 25.82 kg/m   BP Readings from Last 3 Encounters:  07/24/24 121/74  06/19/24 (!) 168/83  09/17/23 (!) 140/82    Wt Readings from Last 3 Encounters:  07/24/24 160 lb (72.6 kg)  06/19/24 171 lb (77.6 kg)  05/17/23 134 lb 9.6 oz (61.1 kg)     Physical Exam Constitutional:      General: She is not in acute distress.    Appearance: She is well-developed.  HENT:     Head: Normocephalic and atraumatic.  Eyes:     Conjunctiva/sclera: Conjunctivae normal.     Pupils: Pupils are equal, round, and reactive to light.  Neck:     Thyroid : No thyromegaly.  Cardiovascular:     Rate and Rhythm: Normal rate and regular rhythm.     Heart sounds: Normal heart sounds. No murmur heard. Pulmonary:     Effort: Pulmonary effort is normal. No respiratory distress.     Breath sounds: Normal breath sounds. No wheezing or rales.  Abdominal:     General: Bowel sounds are normal. There is no distension.     Palpations: Abdomen is soft.     Tenderness: There is no abdominal tenderness.  Musculoskeletal:        General: Normal range of motion.     Cervical back: Normal range of motion and neck supple.  Lymphadenopathy:     Cervical: No cervical adenopathy.  Skin:    General: Skin is warm and dry.  Neurological:     Mental Status: She is alert and oriented to person, place, and time.  Psychiatric:        Behavior: Behavior normal.        Thought Content: Thought content normal.        Judgment: Judgment normal.      Assessment & Plan:  Mixed hyperlipidemia  Other specified hypothyroidism  Essential hypertension  Chronic kidney disease (CKD), stage II (mild)    Assessment and Plan     Chronic kidney disease, stage 2 (mild) Mildly diminished kidney function, consistent with previous assessments. No acute changes.  Hypertension Blood pressure is 121/74, within normal range. - Continue current antihypertensive regimen.  Hyperlipidemia Cholesterol levels are well-controlled with current management. - Continue current lipid management.  Hypothyroidism Free T4 slightly elevated, TSH within normal range. No symptoms of hyperthyroidism reported. - Continue current thyroid  management.  Depression The patient mentions fighting depression and anxiety and is trying to get a hold of it. Discussed social interactions and feelings of being overwhelmed by others' advice.          Follow-up: Return in about 6 months (around 01/24/2025).  [name], M.D.

## 2024-08-06 DIAGNOSIS — M6281 Muscle weakness (generalized): Secondary | ICD-10-CM | POA: Diagnosis not present

## 2024-08-06 DIAGNOSIS — H3530 Unspecified macular degeneration: Secondary | ICD-10-CM | POA: Diagnosis not present

## 2024-08-06 DIAGNOSIS — Z8673 Personal history of transient ischemic attack (TIA), and cerebral infarction without residual deficits: Secondary | ICD-10-CM | POA: Diagnosis not present

## 2024-08-29 ENCOUNTER — Ambulatory Visit

## 2024-08-29 VITALS — BP 121/74 | HR 89 | Ht 66.0 in | Wt 160.0 lb

## 2024-08-29 DIAGNOSIS — Z0000 Encounter for general adult medical examination without abnormal findings: Secondary | ICD-10-CM | POA: Diagnosis not present

## 2024-08-29 DIAGNOSIS — Z13820 Encounter for screening for osteoporosis: Secondary | ICD-10-CM

## 2024-08-29 DIAGNOSIS — Z1231 Encounter for screening mammogram for malignant neoplasm of breast: Secondary | ICD-10-CM

## 2024-08-29 NOTE — Progress Notes (Signed)
 Subjective:   [name] is a 78 y.o. who presents for a Medicare Wellness preventive visit.  As a reminder, Annual Wellness Visits don't include a physical exam, and some assessments may be limited, especially if this visit is performed virtually. We may recommend an in-person follow-up visit with your provider if needed.  Visit Complete: Virtual I connected with  [name] on 08/29/24 by a audio enabled telemedicine application and verified that I am speaking with the correct person using two identifiers.  Patient Location: Home  Provider Location: Home Office  I discussed the limitations of evaluation and management by telemedicine. The patient expressed understanding and agreed to proceed.  Vital Signs: Because this visit was a virtual/telehealth visit, some criteria may be missing or patient reported. Any vitals not documented were not able to be obtained and vitals that have been documented are patient reported.  VideoDeclined- This patient declined Librarian, academic. Therefore the visit was completed with audio only.  Persons Participating in Visit: Patient.  AWV Questionnaire: No: Patient Medicare AWV questionnaire was not completed prior to this visit.  Cardiac Risk Factors include: advanced age (>64men, >80 women);dyslipidemia;hypertension;smoking/ tobacco exposure     Objective:    Today's Vitals   08/29/24 0922  BP: 121/74  Pulse: 89  Weight: 160 lb (72.6 kg)  Height: 5' 6 (1.676 m)   Body mass index is 25.82 kg/m.     08/29/2024    9:27 AM 03/14/2022   11:00 AM 03/13/2022    1:46 PM 03/03/2022    9:49 PM 01/29/2022   11:00 AM 01/28/2022    1:00 AM 11/28/2021    1:27 PM  Advanced Directives  Does Patient Have a Medical Advance Directive? No No No No No Unable to assess, patient is non-responsive or altered mental status Yes  Type of English as a second language teacher of [place];Living will  Copy of Healthcare Power  of Attorney in Chart?       No - copy requested  Would patient like information on creating a medical advance directive?  No - Patient declined No - Patient declined No - Patient declined       Current Medications (verified) Outpatient Encounter Medications as of 08/29/2024  Medication Sig   acetaminophen  (TYLENOL ) 500 MG tablet Take 1,000 mg by mouth every 12 (twelve) hours as needed.   atorvastatin  (LIPITOR) 20 MG tablet Take 1 tablet (20 mg total) by mouth daily.   levothyroxine  (SYNTHROID ) 100 MCG tablet Take 1 tablet (100 mcg total) by mouth daily before breakfast.   Menthol -Zinc Oxide 0.44-20.625 % OINT Apply topically.   metoprolol  succinate (TOPROL -XL) 50 MG 24 hr tablet Take 1 tablet (50 mg total) by mouth daily. Take with or immediately following a meal.   Multiple Vitamin (MULTIVITAMIN) tablet Take 1 tablet by mouth daily.   Multiple Vitamins-Minerals (OCUVITE ADULT 50+) CAPS Take by mouth.   olmesartan  (BENICAR ) 20 MG tablet Take 1 tablet (20 mg total) by mouth daily. For blood pressure   Omega 3 1000 MG CAPS Take by mouth daily.   No facility-administered encounter medications on file as of 08/29/2024.    Allergies (verified) Ciprofloxacin , Procardia [nifedipine], Ativan  Autry.Birk ], and Norvasc [amlodipine besylate]   History: Past Medical History:  Diagnosis Date   Acute pyelonephritis 10/15/2019   Acute renal failure superimposed on stage 2 chronic kidney disease (HCC) 10/15/2019   Alcoholism (HCC) 01/09/2012   Anxiety    Arthritis    Left  knee   B12 deficiency    Carotid artery narrowing 10/03/2012   On the right.   Chronic kidney disease (CKD), stage II (mild)    Depression    Headache(784.0)    Hyperlipidemia    Hypertension    Hyponatremia 01/09/2012   Hypothyroid    Incontinence of urine in female    Menopause    Mini stroke    Orthostatic syncope 10/02/2012   SBO (small bowel obstruction) (HCC) 06/28/2016   Stroke (HCC) 2010   no deficits    Thrombocytopenia (HCC) 10/15/2019   Past Surgical History:  Procedure Laterality Date   AGILE CAPSULE N/A 02/22/2016   dummy capsule remained in the distal ileum   APPENDECTOMY     CATARACT EXTRACTION W/PHACO  06/20/2012   Procedure: CATARACT EXTRACTION PHACO AND INTRAOCULAR LENS PLACEMENT (IOC);  Surgeon: Cherene Mania, MD;  Location: [facility];  Service: Ophthalmology;  Laterality: Right;  CDE:10.66   CATARACT EXTRACTION W/PHACO  07/11/2012   Procedure: CATARACT EXTRACTION PHACO AND INTRAOCULAR LENS PLACEMENT (IOC);  Surgeon: Cherene Mania, MD;  Location: [facility];  Service: Ophthalmology;  Laterality: Left;  CDE: 12.69   COLONOSCOPY N/A 12/17/2015   RMR: Marked circumferential ulceration of the ascending colon/Ileocecal valve ulceration with biopsy most consistent with NSAID injury versus inflammatory bowel disease, pancolonoc diverticulosis redundant colon   ESOPHAGOGASTRODUODENOSCOPY N/A 12/17/2015   MFM:lorzmjuzi gastric polyp, biopsy benign   KNEE ARTHROSCOPY Right 09/2016   LUMBAR LAMINECTOMY/DECOMPRESSION MICRODISCECTOMY N/A 05/25/2017   Procedure: CENTRAL DECOMPRESSIVE LUMBAR LAMINECTOMY FOR SPINAL STENOSIS L3-L4, FORAMINOTOMY FOR THE L4 ROOT AND L5 ROOT;  Surgeon: Heide Ingle, MD;  Location: [facility];  Service: Orthopedics;  Laterality: N/A;   TONSILLECTOMY     TOTAL KNEE ARTHROPLASTY Left 10/31/2021   Procedure: TOTAL KNEE ARTHROPLASTY;  Surgeon: Melodi Lerner, MD;  Location: [facility];  Service: Orthopedics;  Laterality: Left;   Family History  Problem Relation Age of Onset   GER disease Mother    Ulcers Mother    Hypertension Maternal Grandmother    Social History   Socioeconomic History   Marital status: Married    Spouse name: [name]   Number of children: 2   Years of education: 14   Highest education level: 12th grade  Occupational History   Occupation: Investment banker, operational: [employer]    Comment: Retired  Tobacco Use   Smoking status: Former    Current  packs/day: 0.00    Average packs/day: 3.0 packs/day for 30.0 years (90.0 ttl pk-yrs)    Types: Cigarettes    Start date: 06/17/1962    Quit date: 06/17/1992    Years since quitting: 32.2   Smokeless tobacco: Never   Tobacco comments:    3 packs a day x 30 years former smoker.  Not currently smoking  Vaping Use   Vaping status: Never Used  Substance and Sexual Activity   Alcohol use: No   Drug use: No   Sexual activity: Not Currently  Other Topics Concern   Not on file  Social History Narrative   Lives on one level with her husband, Gross Bl[place] lives 3 streets over   Social Drivers of [employer] Strain: Low Risk  (07/24/2024)   Overall Financial Resource Strain (CARDIA)    Difficulty of Paying Living Expenses: Not hard at all  Food Insecurity: No Food Insecurity (08/29/2024)   Hunger Vital Sign    Worried About Running Out of Food in the  Last Year: Never true    Ran Out of Food in the Last Year: Never true  Transportation Needs: No Transportation Needs (08/29/2024)   PRAPARE - Administrator, Civil Service (Medical): No    Lack of Transportation (Non-Medical): No  Physical Activity: Sufficiently Active (08/29/2024)   Exercise Vital Sign    Days of Exercise per Week: 7 days    Minutes of Exercise per Session: 60 min  Stress: No Stress Concern Present (08/29/2024)   [employer] of Occupational Health - Occupational Stress Questionnaire    Feeling of Stress: Only a little  Social Connections: Moderately Isolated (08/29/2024)   Social Connection and Isolation Panel    Frequency of Communication with Friends and Family: More than three times a week    Frequency of Social Gatherings with Friends and Family: More than three times a week    Attends Religious Services: Never    Database administrator or Organizations: Yes    Attends Banker Meetings: Never    Marital Status: Widowed    Tobacco Counseling Counseling given: Yes Tobacco  comments: 3 packs a day x 30 years former smoker.  Not currently smoking    Clinical Intake:  Pre-visit preparation completed: Yes  Pain : No/denies pain     BMI - recorded: 25.82 Nutritional Status: BMI 25 -29 Overweight Nutritional Risks: None Diabetes: No  Lab Results  Component Value Date   HGBA1C 4.7 11/13/2013   HGBA1C 5.0 10/22/2013     How often do you need to have someone help you when you read instructions, pamphlets, or other written materials from your doctor or pharmacy?: 1 - Never  Interpreter Needed?: No  Information entered by :: alia t/cma   Activities of Daily Living     08/29/2024    9:26 AM  In your present state of health, do you have any difficulty performing the following activities:  Hearing? 0  Vision? 0  Difficulty concentrating or making decisions? 0  Walking or climbing stairs? 0  Dressing or bathing? 0  Doing errands, shopping? 0  Preparing Food and eating ? N  Using the Toilet? N  In the past six months, have you accidently leaked urine? N  Do you have problems with loss of bowel control? N  Managing your Medications? N  Managing your Finances? N  Housekeeping or managing your Housekeeping? N    Patient Care Team: [name], MD as PCP - General (Family Medicine) Shaaron Kelsey HERO, MD as Consulting Physician (Gastroenterology) Heide Ingle, MD as Consulting Physician (Orthopedic Surgery)  I have updated your Care Teams any recent Medical Services you may have received from other providers in the past year.     Assessment:   This is a routine wellness examination for Tyresha.  Hearing/Vision screen Hearing Screening - Comments:: Pt denies hearing dif Vision Screening - Comments:: Pt wear glasses/pt goes to [facility] [place]/last ov /na   Goals Addressed   None    Depression Screen     08/29/2024    9:30 AM 07/24/2024   12:54 PM 06/19/2024   11:35 AM 11/28/2021    1:26 PM 09/21/2021    3:16 PM 07/26/2021    10:28 AM 05/17/2021   11:20 AM  PHQ 2/9 Scores  PHQ - 2 Score 0 0 0 0 0 0 0  PHQ- 9 Score   0    1    Fall Risk     08/29/2024  9:23 AM 07/24/2024   12:54 PM 11/28/2021    1:39 PM 09/21/2021    3:16 PM 07/26/2021   10:28 AM  Fall Risk   Falls in the past year? 0 0 1 1 1   Number falls in past yr: 0  1 1 1   Injury with Fall? 0  0 0 0  Risk for fall due to : No Fall Risks  Impaired balance/gait;Orthopedic patient;Medication side effect History of fall(s);Impaired balance/gait History of fall(s);Impaired balance/gait  Follow up Falls evaluation completed  Education provided;Falls prevention discussed  Falls evaluation completed  Falls evaluation completed      Data saved with a previous flowsheet row definition    MEDICARE RISK AT HOME:  Medicare Risk at Home Any stairs in or around the home?: No If so, are there any without handrails?: No Home free of loose throw rugs in walkways, pet beds, electrical cords, etc?: Yes Adequate lighting in your home to reduce risk of falls?: Yes Life alert?: No Use of a cane, walker or w/c?: No Grab bars in the bathroom?: Yes Shower chair or bench in shower?: Yes Elevated toilet seat or a handicapped toilet?: Yes  TIMED UP AND GO:  Was the test performed?  no  Cognitive Function: 6CIT completed    06/25/2018    2:48 PM 09/18/2017   10:36 AM  MMSE - Mini Mental State Exam  Orientation to time 5 4   Orientation to Place 5 5   Registration 3 3   Attention/ Calculation 5 5   Recall 3 2   Language- name 2 objects 2 2   Language- repeat 1 1  Language- follow 3 step command 2 3   Language- read & follow direction 1 1   Write a sentence 1 1   Copy design 1 0   Total score 29 27      Data saved with a previous flowsheet row definition        08/29/2024    9:27 AM 11/25/2020   12:09 PM 08/01/2019    9:26 AM  6CIT Screen  What Year? 0 points 0 points 0 points  What month? 0 points 0 points 0 points  What time? 0 points 0 points 0  points  Count back from 20 0 points 0 points 0 points  Months in reverse 2 points 0 points 0 points  Repeat phrase 2 points 0 points 2 points  Total Score 4 points 0 points 2 points    Immunizations Immunization History  Administered Date(s) Administered   Fluad Quad(high Dose 65+) 09/29/2019, 09/07/2020   INFLUENZA, HIGH DOSE SEASONAL PF 09/23/2014, 09/11/2016, 09/05/2017, 09/16/2018   Influenza,inj,Quad PF,6+ Mos 10/01/2013, 09/10/2015   Moderna SARS-COV2 Booster Vaccination 11/24/2020, 09/07/2021   Moderna Sars-Covid-2 Vaccination 02/19/2020, 03/24/2020   Pneumococcal Conjugate-13 05/12/2016   Pneumococcal Polysaccharide-23 01/10/2012, 04/27/2015   Tdap 12/14/2015   Zoster Recombinant(Shingrix) 11/28/2019, 04/22/2020    Screening Tests Health Maintenance  Topic Date Due   COVID-19 Vaccine (3 - Moderna risk series) 10/05/2021   DEXA SCAN  09/07/2022   Mammogram  09/03/2024   Influenza Vaccine  03/10/2025 (Originally 07/11/2024)   Medicare Annual Wellness (AWV)  08/29/2025   DTaP/Tdap/Td (2 - Td or Tdap) 12/13/2025   Pneumococcal Vaccine: 50+ Years  Completed   Hepatitis C Screening  Completed   Zoster Vaccines- Shingrix  Completed   HPV VACCINES  Aged Out   Meningococcal B Vaccine  Aged Out   Colonoscopy  Discontinued    Health Maintenance  Items Addressed: DEXA ordered & Mammogram  Additional Screening:  Vision Screening: Recommended annual ophthalmology exams for early detection of glaucoma and other disorders of the eye. Is the patient up to date with their annual eye exam?  No  Who is the provider or what is the name of the office in which the patient attends annual eye exams? [facility]  Dental Screening: Recommended annual dental exams for proper oral hygiene  Community Resource Referral / Chronic Care Management: CRR required this visit?  No   CCM required this visit?  No   Plan:    I have personally reviewed and noted the following in the  patient's chart:   Medical and social history Use of alcohol, tobacco or illicit drugs  Current medications and supplements including opioid prescriptions. Patient is not currently taking opioid prescriptions. Functional ability and status Nutritional status Physical activity Advanced directives List of other physicians Hospitalizations, surgeries, and ER visits in previous 12 months Vitals Screenings to include cognitive, depression, and falls Referrals and appointments  In addition, I have reviewed and discussed with patient certain preventive protocols, quality metrics, and best practice recommendations. A written personalized care plan for preventive services as well as general preventive health recommendations were provided to patient.   Ozie Ned, CMA   08/29/2024   After Visit Summary: (MyChart) Due to this being a telephonic visit, the after visit summary with patients personalized plan was offered to patient via MyChart   Notes: PCP Follow Up Recommendations: Pt is aware and due the following: Dexa scan, mammogram

## 2024-08-29 NOTE — Patient Instructions (Signed)
 [name],  Thank you for taking the time for your Medicare Wellness Visit. I appreciate your continued commitment to your health goals. Please review the care plan we discussed, and feel free to reach out if I can assist you further.  Medicare recommends these wellness visits once per year to help you and your care team stay ahead of potential health issues. These visits are designed to focus on prevention, allowing your provider to concentrate on managing your acute and chronic conditions during your regular appointments.  Please note that Annual Wellness Visits do not include a physical exam. Some assessments may be limited, especially if the visit was conducted virtually. If needed, we may recommend a separate in-person follow-up with your provider.  Ongoing Care Seeing your primary care provider every 3 to 6 months helps us  monitor your health and provide consistent, personalized care.   Referrals If a referral was made during today's visit and you haven't received any updates within two weeks, please contact the referred provider directly to check on the status.  Recommended Screenings:  Health Maintenance  Topic Date Due   COVID-19 Vaccine (3 - Moderna risk series) 10/05/2021   DEXA scan (bone density measurement)  09/07/2022   Medicare Annual Wellness Visit  11/28/2022   Breast Cancer Screening  09/03/2024   Flu Shot  03/10/2025*   DTaP/Tdap/Td vaccine (2 - Td or Tdap) 12/13/2025   Pneumococcal Vaccine for age over 64  Completed   Hepatitis C Screening  Completed   Zoster (Shingles) Vaccine  Completed   HPV Vaccine  Aged Out   Meningitis B Vaccine  Aged Out   Colon Cancer Screening  Discontinued  *Topic was postponed. The date shown is not the original due date.       08/29/2024    9:27 AM  Advanced Directives  Does Patient Have a Medical Advance Directive? No   Advance Care Planning is important because it: Ensures you receive medical care that aligns with your  values, goals, and preferences. Provides guidance to your family and loved ones, reducing the emotional burden of decision-making during critical moments.  Vision: Annual vision screenings are recommended for early detection of glaucoma, cataracts, and diabetic retinopathy. These exams can also reveal signs of chronic conditions such as diabetes and high blood pressure.  Dental: Annual dental screenings help detect early signs of oral cancer, gum disease, and other conditions linked to overall health, including heart disease and diabetes.  Please see the attached documents for additional preventive care recommendations.

## 2024-10-01 ENCOUNTER — Ambulatory Visit (INDEPENDENT_AMBULATORY_CARE_PROVIDER_SITE_OTHER)

## 2024-10-01 ENCOUNTER — Ambulatory Visit
Admission: RE | Admit: 2024-10-01 | Discharge: 2024-10-01 | Disposition: A | Discharge status: Home / Self Care | Source: Ambulatory Visit | Attending: Family Medicine | Admitting: Family Medicine

## 2024-10-01 DIAGNOSIS — Z780 Asymptomatic menopausal state: Secondary | ICD-10-CM

## 2024-10-01 DIAGNOSIS — Z0000 Encounter for general adult medical examination without abnormal findings: Secondary | ICD-10-CM

## 2024-10-01 DIAGNOSIS — Z1231 Encounter for screening mammogram for malignant neoplasm of breast: Secondary | ICD-10-CM | POA: Diagnosis not present

## 2024-10-01 DIAGNOSIS — Z13820 Encounter for screening for osteoporosis: Secondary | ICD-10-CM

## 2024-10-01 DIAGNOSIS — Z23 Encounter for immunization: Secondary | ICD-10-CM

## 2024-10-03 ENCOUNTER — Other Ambulatory Visit: Payer: Self-pay | Admitting: *Deleted

## 2024-10-03 DIAGNOSIS — M810 Age-related osteoporosis without current pathological fracture: Secondary | ICD-10-CM | POA: Diagnosis not present

## 2024-10-03 DIAGNOSIS — E039 Hypothyroidism, unspecified: Secondary | ICD-10-CM

## 2024-10-03 DIAGNOSIS — E782 Mixed hyperlipidemia: Secondary | ICD-10-CM

## 2024-10-03 MED ORDER — LEVOTHYROXINE SODIUM 100 MCG PO TABS: 100.0000 ug | ORAL_TABLET | Freq: Every day | ORAL | 1 refills | Status: DC

## 2024-10-03 MED ORDER — ATORVASTATIN CALCIUM 20 MG PO TABS: 20.0000 mg | ORAL_TABLET | Freq: Every day | ORAL | 1 refills | Status: DC

## 2024-10-06 DIAGNOSIS — M6281 Muscle weakness (generalized): Secondary | ICD-10-CM | POA: Diagnosis not present

## 2024-10-06 DIAGNOSIS — Z8673 Personal history of transient ischemic attack (TIA), and cerebral infarction without residual deficits: Secondary | ICD-10-CM | POA: Diagnosis not present

## 2024-10-06 DIAGNOSIS — H3530 Unspecified macular degeneration: Secondary | ICD-10-CM | POA: Diagnosis not present

## 2024-10-07 ENCOUNTER — Other Ambulatory Visit: Payer: Self-pay | Admitting: Family Medicine

## 2024-10-07 DIAGNOSIS — R928 Other abnormal and inconclusive findings on diagnostic imaging of breast: Secondary | ICD-10-CM

## 2024-10-09 ENCOUNTER — Ambulatory Visit: Payer: Self-pay | Admitting: Family Medicine

## 2024-10-09 NOTE — Progress Notes (Signed)
DEXA shows osteoporosis. I recommend weekly fosamax. Nurse, if pt. Is agreeable, send in Fosamax 70 mg weekly, #13.  Thanks, WS 

## 2024-10-13 ENCOUNTER — Other Ambulatory Visit: Payer: Self-pay

## 2024-10-13 MED ORDER — ALENDRONATE SODIUM 70 MG PO TABS: 70.0000 mg | ORAL_TABLET | ORAL | 0 refills | Status: AC

## 2024-10-20 ENCOUNTER — Encounter

## 2024-10-23 ENCOUNTER — Ambulatory Visit (HOSPITAL_COMMUNITY)
Admission: RE | Admit: 2024-10-23 | Discharge: 2024-10-23 | Disposition: A | Discharge status: Home / Self Care | Source: Ambulatory Visit | Attending: Family Medicine | Admitting: Family Medicine

## 2024-10-23 ENCOUNTER — Encounter (HOSPITAL_COMMUNITY): Payer: Self-pay

## 2024-10-23 ENCOUNTER — Ambulatory Visit (HOSPITAL_COMMUNITY)
Admission: RE | Admit: 2024-10-23 | Discharge: 2024-10-23 | Disposition: A | Source: Ambulatory Visit | Attending: Family Medicine | Admitting: Family Medicine

## 2024-10-23 ENCOUNTER — Other Ambulatory Visit (HOSPITAL_COMMUNITY): Payer: Self-pay | Admitting: Family Medicine

## 2024-10-23 DIAGNOSIS — R928 Other abnormal and inconclusive findings on diagnostic imaging of breast: Secondary | ICD-10-CM | POA: Diagnosis present

## 2024-10-27 ENCOUNTER — Telehealth: Payer: Self-pay

## 2024-10-27 NOTE — Telephone Encounter (Signed)
 Copied from CRM #[id number]. Topic: Clinical - Lab/Test Results >> Oct 27, 2024  1:39 PM [name] wrote: Reason for CRM: Patient had a mammogram at [facility] last week.Results showed two areas of concern, and the patient was advised to return for a biopsy. Patient has not received any follow-up communication and is asking whether Dr. Zollie has been made aware of the findings. The patient is requesting a callback at [phone]

## 2024-10-27 NOTE — Telephone Encounter (Signed)
 Called patient - no answer and voicemail full.  Reviewed chart and  patient has appt already schedule for her bx. - looks like it was scheduled by [facility] following her imaging on Thurs day 10/23/2024. Appt is 11/18/2024 8 AM at [facility] - if patient needs to change appt she needs to call [facility] at [phone]

## 2024-10-27 NOTE — Telephone Encounter (Signed)
 Yes, I am aware. I'm sorry this is going on. They usually call quickly. I will send this to [facility] who helps me with arranging these

## 2024-10-27 NOTE — Telephone Encounter (Signed)
 Correction contact # for [facility] is [phone]

## 2024-10-27 NOTE — Telephone Encounter (Signed)
 Gave clarification to when apt is. LS

## 2024-11-03 ENCOUNTER — Encounter: Payer: Self-pay | Admitting: Family Medicine

## 2024-11-17 ENCOUNTER — Encounter (HOSPITAL_COMMUNITY): Payer: Self-pay | Admitting: Family Medicine

## 2024-11-17 ENCOUNTER — Other Ambulatory Visit (HOSPITAL_COMMUNITY): Payer: Self-pay | Admitting: Family Medicine

## 2024-11-17 DIAGNOSIS — R928 Other abnormal and inconclusive findings on diagnostic imaging of breast: Secondary | ICD-10-CM

## 2024-11-18 ENCOUNTER — Ambulatory Visit (HOSPITAL_COMMUNITY): Admission: RE | Admit: 2024-11-18 | Source: Ambulatory Visit

## 2024-11-18 ENCOUNTER — Inpatient Hospital Stay (HOSPITAL_COMMUNITY): Admission: RE | Admit: 2024-11-18 | Source: Ambulatory Visit

## 2024-11-25 ENCOUNTER — Inpatient Hospital Stay (HOSPITAL_COMMUNITY): Admission: RE | Admit: 2024-11-25 | Discharge: 2024-11-25 | Attending: Family Medicine | Admitting: Family Medicine

## 2024-11-25 ENCOUNTER — Ambulatory Visit (HOSPITAL_COMMUNITY)
Admission: RE | Admit: 2024-11-25 | Discharge: 2024-11-25 | Disposition: A | Source: Ambulatory Visit | Attending: Family Medicine | Admitting: Family Medicine

## 2024-11-25 ENCOUNTER — Encounter (HOSPITAL_COMMUNITY): Payer: Self-pay

## 2024-11-25 DIAGNOSIS — R928 Other abnormal and inconclusive findings on diagnostic imaging of breast: Secondary | ICD-10-CM | POA: Insufficient documentation

## 2024-11-25 HISTORY — PX: BREAST BIOPSY: SHX20

## 2024-11-25 MED ORDER — LIDOCAINE-EPINEPHRINE (PF) 1 %-1:200000 IJ SOLN
INTRAMUSCULAR | Status: AC
  Filled 2024-11-25: qty 30

## 2024-11-25 MED ORDER — LIDOCAINE HCL (PF) 2 % IJ SOLN
INTRAMUSCULAR | Status: AC
  Filled 2024-11-25: qty 20

## 2024-11-27 LAB — SURGICAL PATHOLOGY

## 2024-11-28 ENCOUNTER — Encounter: Payer: Self-pay | Admitting: Family Medicine

## 2024-12-01 ENCOUNTER — Other Ambulatory Visit: Payer: Self-pay | Admitting: Family Medicine

## 2024-12-01 DIAGNOSIS — E782 Mixed hyperlipidemia: Secondary | ICD-10-CM

## 2024-12-01 DIAGNOSIS — E039 Hypothyroidism, unspecified: Secondary | ICD-10-CM

## 2024-12-02 ENCOUNTER — Encounter: Payer: Self-pay | Admitting: *Deleted

## 2024-12-07 ENCOUNTER — Other Ambulatory Visit: Payer: Self-pay | Admitting: Family Medicine

## 2024-12-07 DIAGNOSIS — C50919 Malignant neoplasm of unspecified site of unspecified female breast: Secondary | ICD-10-CM

## 2024-12-15 DIAGNOSIS — C50912 Malignant neoplasm of unspecified site of left female breast: Secondary | ICD-10-CM

## 2024-12-18 ENCOUNTER — Ambulatory Visit: Admitting: General Surgery

## 2024-12-18 ENCOUNTER — Encounter: Payer: Self-pay | Admitting: General Surgery

## 2024-12-18 VITALS — BP 136/82 | HR 106 | Temp 98.2°F | Resp 14 | Ht 66.0 in | Wt 143.0 lb

## 2024-12-18 DIAGNOSIS — C50512 Malignant neoplasm of lower-outer quadrant of left female breast: Secondary | ICD-10-CM

## 2024-12-18 DIAGNOSIS — Z171 Estrogen receptor negative status [ER-]: Secondary | ICD-10-CM

## 2024-12-18 DIAGNOSIS — Z01818 Encounter for other preprocedural examination: Secondary | ICD-10-CM | POA: Diagnosis not present

## 2024-12-19 NOTE — Progress Notes (Signed)
 [name]; [id number]; [birth year]   HPI Patient is a 79 year old white female who is referred to my care by Butler Der for evaluation and treatment of a left breast cancer.  This was recently diagnosed by screening mammography.  A 1.8 cm mass at the 3 o'clock position in the left breast was biopsy positive for invasive mammary carcinoma, ER/PR negative, HER2 positive.  Patient denies any history of breast cancer personally or in her family.  Patient has an appointment with oncology on 12/29/2024. Past Medical History:  Diagnosis Date   Acute pyelonephritis 10/15/2019   Acute renal failure superimposed on stage 2 chronic kidney disease 10/15/2019   Alcoholism (HCC) 01/09/2012   Anxiety    Arthritis    Left knee   B12 deficiency    Carotid artery narrowing 10/03/2012   On the right.   Chronic kidney disease (CKD), stage II (mild)    Depression    Headache(784.0)    Hyperlipidemia    Hypertension    Hyponatremia 01/09/2012   Hypothyroid    Incontinence of urine in female    Menopause    Mini stroke    Orthostatic syncope 10/02/2012   SBO (small bowel obstruction) (HCC) 06/28/2016   Stroke (HCC) 2010   no deficits   Thrombocytopenia 10/15/2019    Past Surgical History:  Procedure Laterality Date   AGILE CAPSULE N/A 02/22/2016   dummy capsule remained in the distal ileum   APPENDECTOMY     BREAST BIOPSY Left 11/25/2024   US  LT BREAST BX W LOC DEV 1ST LESION IMG BX SPEC US  GUIDE 11/25/2024 Mir, Aliene SAUNDERS, MD [facility]-ULTRASOUND   BREAST BIOPSY Left 11/25/2024   US  LT BREAST BX W LOC DEV EA ADD LESION IMG BX SPEC US  GUIDE 11/25/2024 Mir, Aliene SAUNDERS, MD [facility]-ULTRASOUND   CATARACT EXTRACTION W/PHACO  06/20/2012   Procedure: CATARACT EXTRACTION PHACO AND INTRAOCULAR LENS PLACEMENT (IOC);  Surgeon: Cherene Mania, MD;  Location: [facility];  Service: Ophthalmology;  Laterality: Right;  CDE:10.66   CATARACT EXTRACTION W/PHACO  07/11/2012   Procedure: CATARACT EXTRACTION PHACO AND INTRAOCULAR LENS  PLACEMENT (IOC);  Surgeon: Cherene Mania, MD;  Location: [facility];  Service: Ophthalmology;  Laterality: Left;  CDE: 12.69   COLONOSCOPY N/A 12/17/2015   RMR: Marked circumferential ulceration of the ascending colon/Ileocecal valve ulceration with biopsy most consistent with NSAID injury versus inflammatory bowel disease, pancolonoc diverticulosis redundant colon   ESOPHAGOGASTRODUODENOSCOPY N/A 12/17/2015   MFM:lorzmjuzi gastric polyp, biopsy benign   KNEE ARTHROSCOPY Right 09/2016   LUMBAR LAMINECTOMY/DECOMPRESSION MICRODISCECTOMY N/A 05/25/2017   Procedure: CENTRAL DECOMPRESSIVE LUMBAR LAMINECTOMY FOR SPINAL STENOSIS L3-L4, FORAMINOTOMY FOR THE L4 ROOT AND L5 ROOT;  Surgeon: Heide Ingle, MD;  Location: [facility];  Service: Orthopedics;  Laterality: N/A;   TONSILLECTOMY     TOTAL KNEE ARTHROPLASTY Left 10/31/2021   Procedure: TOTAL KNEE ARTHROPLASTY;  Surgeon: Melodi Lerner, MD;  Location: [facility];  Service: Orthopedics;  Laterality: Left;    Family History  Problem Relation Age of Onset   GER disease Mother    Ulcers Mother    Hypertension Maternal Grandmother    Breast cancer Neg Hx     Medications Ordered Prior to Encounter[1]  Allergies[2]  Social History   Substance and Sexual Activity  Alcohol Use No    Tobacco Use History[3]  Review of Systems  Constitutional: Negative.   HENT: Negative.    Eyes: Negative.   Cardiovascular:  Positive for palpitations.  Gastrointestinal: Negative.   Genitourinary: Negative.   Musculoskeletal:  Positive for back pain, joint pain and neck pain.  Skin: Negative.   Neurological: Negative.   Endo/Heme/Allergies: Negative.   Psychiatric/Behavioral: Negative.      Objective   Vitals:   12/18/24 0915  BP: 136/82  Pulse: (!) 106  Resp: 14  Temp: 98.2 F (36.8 C)  SpO2: 97%    Physical Exam Vitals reviewed.  Constitutional:      Appearance: Normal appearance. She is normal weight. She is not ill-appearing.  HENT:     Head:  Normocephalic and atraumatic.  Cardiovascular:     Rate and Rhythm: Normal rate and regular rhythm.     Heart sounds: Normal heart sounds. No murmur heard.    No friction rub. No gallop.  Pulmonary:     Effort: Pulmonary effort is normal. No respiratory distress.     Breath sounds: Normal breath sounds. No stridor. No wheezing, rhonchi or rales.  Skin:    General: Skin is warm and dry.  Neurological:     Mental Status: She is alert and oriented to person, place, and time.   Breast: Dominant 2 cm mass noted at the 3 o'clock position in the left breast.  Axilla negative for palpable nodes.  No dominant mass, nipple discharge, or dimpling is noted in the right breast.  Axilla negative for palpable nodes.   Results Surgical pathology (Order [id number]) MyChart Results Release  MyChart Status: Active (w/ proxy users)  Results Release   Surgical pathology Order: [id number]  Status: Edited Result - FINAL     Next appt: 12/29/2024 at 01:40 PM in Oncology Felicita Dry, MD)     Dx: Abnormal mammogram   Test Result Released: Yes (seen)   0 Result Notes    Component Ref Range & Units (hover) 3 wk ago  SURGICAL PATHOLOGY SURGICAL PATHOLOGY * THIS IS AN ADDENDUM REPORT * CASE: [id number] PATIENT: [name] Surgical Pathology Report *Addendum *  Reason for Addendum #1:  Breast Biomarker Results  Clinical History: Left breast mass- 0.6cm- 4:00 3 CMFN, left breast mass- 1.8cm- 3:00 4 CMFN, 78 YOF with 2 suspicious left breast masses presents for US  guided biopsy pf both masses, abnormal mammogram [R92.8] (las)     FINAL MICROSCOPIC DIAGNOSIS:  A. BREAST MASS, LEFT 4:00 3CM FN (WING), NEEDLE CORE BIOPSY: - Invasive mammary carcinoma, no special type (ductal). - Tubule formation: Score 2 - Nuclear pleomorphism: Score 3 - Mitotic count: Score 3 - Total score: 8 - Overall Grade: 3 - Lymphovascular invasion: Not identified - Cancer Length: 10 mm - Calcifications: Not  identified - Ductal carcinoma in situ: Not identified - See comment.  B. BREAST MASS, LEFT 3:00 4 CM FN (RIBBON), NEEDLE CORE BIOPSY: - Benign lymph node, negative for malignancy.  Comment: These results were communicated to Mliss Molt, RN at [facility] breast center on 11/26/2024. ER, PR, HER2, and Ki67 will be performed on block A1 and reported in an addendum. This case underwent intradepartmental consultation and Dr. Macie concurs with the interpretation.  GROSS DESCRIPTION:  A.  Received in formalin labeled with the patient's name and left 4:00 are 4 tan-pink needle core biopsy fragments ranging from 1.3 to 1.7 cm in length by 0.2 cm in diameter.  The specimen is entirely submitted in A1.  Time in formalin: 8:30 AM on 11/25/2024.  Cold ischemic time: Less than 1 minute.  B.  Received in formalin labeled with the patient's name and left 3:00 are 4 pink-red needle core biopsy fragments ranging from 0.5 to  1.4 cm in length by 0.2 cm in diameter.  The specimen is entirely submitted in B1. [facility] 11/25/2024)   Final Diagnosis performed by Rexene Daily, MD.   Electronically signed 11/26/2024 Technical component performed at [facility], [address], [place], [place] [address].  Professional component performed at [facility]. [address] [place], [place] [address]  Immunohistochemistry Technical component (if applicable) was performed at [employer]. [address], [address], [place], [place] [address].  IMMUNOHISTOCHEMISTRY DISCLAIMER (if applicable): Some of these immunohistochemical stains may have been developed and the performance characteristics determine by [facility] Some may not have been cleared or approved by the U.S. Food and Drug Administration. The FDA has determined that such clearance or approval is not necessary. This test is used for clinical purposes. It should not  be regarded as investigational or for research. This laboratory is certified under the Clinical Laboratory Improvement Amendments of 1988 (CLIA-88) as qualified to perform high complexity clinical laboratory testing.  The controls stained appropriately.  ADDENDUM:  Breast, left, needle core biopsy  PROGNOSTIC INDICATORS  Results: IMMUNOHISTOCHEMICAL AND MORPHOMETRIC ANALYSIS PERFORMED MANUALLY  The tumor cells are positive for Her2 (3+).  Estrogen Receptor:  0%, Negative Progesterone Receptor:  0%, Negative Proliferation Marker Ki67: 40%  COMMENT:  The negative hormone receptor study(ies) in this case has an internal positive control.   REFERENCE RANGE ESTROGEN RECEPTOR        NEGATIVE     0%        POSITIVE       =>1% REFERENCE RANGE PROGESTERONE RECEPTOR        NEGATIVE     0%        POSITIVE        =>1%  All controls stained appropriately          Addendum #1 performed by Pepper Dutton, MD.   Electronically signed 11/27/2024 Technical component performed at [facility], [address] [place], [place] [address].  Professional component performed at [employer]. [facility], [address], [place], [place] [address].  Immunohistochemistry Technical component (if applicable) was performed at [facility]. [address], [place], [place] [address].   IMMUNOHISTOCHEMISTRY DISCLAIMER (if applicable): Some of these immunohistochemical stains may have been developed and the performance characteristics determine by [facility]. Some may not have been cleared or approved by the U.S. Food and Drug Administration. The FDA has determined that such clearance or approval is not necessary. This test is used for clinical purposes. It should not be regarded as investigational or for research. This laboratory is certified under the Clinical Laboratory Improvement Amendments of 1988 (CLIA-88) as qualified to  perform high complexity clinical laboratory testing.  The controls stained appropriately.   IHC stains are performed on formalin fixed, paraffin embedded tissue using a 3,3diaminobenzidine (DAB) chromogen and Leica Bond Autostainer System. The staining intensity of the nucleus is score manually and is reported as the percentage of tumor cell nuclei demonstrating specific nuclear staining. The specimens are fixed in 10% Neutral Formalin for at least 6 hours and up to 72hrs. These tests are validated on decalcified tissue. Results should be interpreted with caution given the possibility of false negative results on decalcified specimens. Antibody Clones are as follows ER-clone 48F, PR-clone 16, Ki67- clone MM1. Some of these immunohistochemical stains may have been developed and the performance characteristics determined by [facility].  Resulting Agency [facility]  [facility]        Specimen Collected: 11/25/24 09:01 Last Resulted: 11/27/24 12:46    Assessment  Left breast carcinoma, ER/PR negative, HER2 positive.  1.8 cm in its greatest diameter.  Lymph node in this region negative by biopsy. Plan  I told the patient her surgical options included a left partial mastectomy with sentinel lymph node biopsy versus a left modified radical mastectomy.  Given her age, she will probably not need radiation therapy.  She will be seeing Dr. Davonna of Oncology on 12/29/2024.  She states that she will make her decision after that visit.  In the meantime, we will get a 2D echo to further evaluate her cardiac status.     [1]  Current Outpatient Medications on File Prior to Visit  Medication Sig Dispense Refill   acetaminophen  (TYLENOL ) 500 MG tablet Take 1,000 mg by mouth every 12 (twelve) hours as needed.     atorvastatin  (LIPITOR) 20 MG tablet TAKE 1 TABLET BY MOUTH DAILY 100 tablet 0   levothyroxine  (SYNTHROID ) 100 MCG tablet TAKE 1 TABLET BY MOUTH DAILY  BEFORE BREAKFAST 100 tablet 0   OVER THE  COUNTER MEDICATION Take 1 tablet by mouth daily. Megaboost     alendronate  (FOSAMAX ) 70 MG tablet Take 1 tablet (70 mg total) by mouth once a week. Take with a full glass of water  on an empty stomach. (Patient not taking: Reported on 12/18/2024) 13 tablet 0   Multiple Vitamin (MULTIVITAMIN) tablet Take 1 tablet by mouth daily. (Patient not taking: Reported on 12/18/2024)     Multiple Vitamins-Minerals (OCUVITE ADULT 50+) CAPS Take by mouth. (Patient not taking: Reported on 12/18/2024)     olmesartan  (BENICAR ) 20 MG tablet Take 1 tablet (20 mg total) by mouth daily. For blood pressure (Patient not taking: Reported on 12/18/2024) 90 tablet 1   No current facility-administered medications on file prior to visit.  [2]  Allergies Allergen Reactions   Ciprofloxacin  Other (See Comments)    Caused pt to have delusional thoughts and hallucinations   Procardia [Nifedipine] Swelling    Edema     Ativan  [Lorazepam ]    Norvasc [Amlodipine Besylate] Hives  [3]  Social History Tobacco Use  Smoking Status Former   Current packs/day: 0.00   Average packs/day: 3.0 packs/day for 30.0 years (90.0 ttl pk-yrs)   Types: Cigarettes   Start date: 06/17/1962   Quit date: 06/17/1992   Years since quitting: 32.5  Smokeless Tobacco Never  Tobacco Comments   3 packs a day x 30 years former smoker.  Not currently smoking

## 2024-12-26 NOTE — Progress Notes (Signed)
Introductory phone call placed to patient today. Introduced myself and explained my role in the patient's care. Provided a reminder of patient's upcoming appt. Patient made aware of clinic's location and visitor policy. No further questions at this time per patient.

## 2024-12-28 NOTE — Progress Notes (Signed)
 "  Hematology-Oncology Clinic Note  [name], MD   Reason for Referral: Invasive ductal carcinoma of left breast  Oncology History: I have reviewed her chart and materials related to her cancer extensively and collaborated history with the patient. Summary of oncologic history is as follows:  Diagnosis: Invasive ductal carcinoma of left breast  -10/01/2024: Bilateral Screening Mammogram: Further evaluation is suggested for a possible mass and possible asymmetry in the left breast. -10/23/2024: Left Diagnostic Mammogram and US :  Suspicious 1.8 cm LEFT breast mass (4 o'clock 3 CMFN), corresponding to the mammographic mass in the lower-outer LEFT breast. Suspicious 0.6 cm LEFT breast mass (3 o'clock 4 CMFN), corresponding to the mammographic mass seen in the outer LEFT breast. -11/25/2024: Biopsies of 4 o'clock and 3 o'clock left breast masses.  -4 o'clock left breast mass pathology: Grade 3 Invasive mammary carcinoma, no special type (ductal), 10 mm in length. No DCIS, lymphovascular invasion, or calcifications identified. Tumor cells are ER/PR negative and Her2 positive (3+). Ki-67 40%.  -3 o'clock left breast mass pathology: Negative for malignancy.    History of Presenting Illness: Discussed the use of AI scribe software for clinical note transcription with the patient, who gave verbal consent to proceed.  History of Present Illness [name] or [name] is a 79 year old woman with newly diagnosed stage I HER2-positive left breast cancer who presents for initial oncology consultation to discuss treatment planning. She is accompanied by her daughter today.   She was diagnosed with stage I HER2-positive left breast cancer following a screening mammogram that identified a 1.8 cm mass in the left breast, confirmed by core biopsy as a 10 mm lesion. An additional suspicious area on imaging was biopsied and found to be benign. She had not noticed a palpable mass prior to imaging  and currently has no breast pain or other localizing symptoms. There is no family history of breast or other malignancies.  Since her diagnosis, she has experienced palpitations, which she and her daughter attribute to stress and anxiety. She denies chest pain or other cardiac symptoms. She notes mild lower extremity edema, which she attributes to age and decreased mobility. She already met with Dr.Jenkins and is getting an ECHO done in planning for surgery.     Medical History: Past Medical History:  Diagnosis Date   Acute pyelonephritis 10/15/2019   Acute renal failure superimposed on stage 2 chronic kidney disease 10/15/2019   Alcoholism (HCC) 01/09/2012   Anxiety    Arthritis    Left knee   B12 deficiency    Carotid artery narrowing 10/03/2012   On the right.   Chronic kidney disease (CKD), stage II (mild)    Depression    Headache(784.0)    Hyperlipidemia    Hypertension    Hyponatremia 01/09/2012   Hypothyroid    Incontinence of urine in female    Menopause    Mini stroke    Orthostatic syncope 10/02/2012   SBO (small bowel obstruction) (HCC) 06/28/2016   Stroke (HCC) 2010   no deficits   Thrombocytopenia 10/15/2019    Surgical history: Past Surgical History:  Procedure Laterality Date   AGILE CAPSULE N/A 02/22/2016   dummy capsule remained in the distal ileum   APPENDECTOMY     BREAST BIOPSY Left 11/25/2024   US  LT BREAST BX W LOC DEV 1ST LESION IMG BX SPEC US  GUIDE 11/25/2024 Mir, Aliene SAUNDERS, MD [facility]   BREAST BIOPSY Left 11/25/2024   US  LT BREAST BX W LOC DEV EA  ADD LESION IMG BX SPEC US  GUIDE 11/25/2024 Mir, Aliene SAUNDERS, MD [facility]   CATARACT EXTRACTION [facility]  06/20/2012   Procedure: CATARACT EXTRACTION PHACO AND INTRAOCULAR LENS PLACEMENT (IOC);  Surgeon: Cherene Mania, MD;  Location: [facility];  Service: Ophthalmology;  Laterality: Right;  CDE:10.66   CATARACT EXTRACTION W/PHACO  07/11/2012   Procedure: CATARACT EXTRACTION PHACO AND INTRAOCULAR LENS  PLACEMENT (IOC);  Surgeon: Cherene Mania, MD;  Location: [facility];  Service: Ophthalmology;  Laterality: Left;  CDE: 12.69   COLONOSCOPY N/A 12/17/2015   RMR: Marked circumferential ulceration of the ascending colon/Ileocecal valve ulceration with biopsy most consistent with NSAID injury versus inflammatory bowel disease, pancolonoc diverticulosis redundant colon   ESOPHAGOGASTRODUODENOSCOPY N/A 12/17/2015   MFM:lorzmjuzi gastric polyp, biopsy benign   KNEE ARTHROSCOPY Right 09/2016   LUMBAR LAMINECTOMY/DECOMPRESSION MICRODISCECTOMY N/A 05/25/2017   Procedure: CENTRAL DECOMPRESSIVE LUMBAR LAMINECTOMY FOR SPINAL STENOSIS L3-L4, FORAMINOTOMY FOR THE L4 ROOT AND L5 ROOT;  Surgeon: Heide Ingle, MD;  Location: [facility];  Service: Orthopedics;  Laterality: N/A;   TONSILLECTOMY     TOTAL KNEE ARTHROPLASTY Left 10/31/2021   Procedure: TOTAL KNEE ARTHROPLASTY;  Surgeon: Melodi Lerner, MD;  Location: [facility];  Service: Orthopedics;  Laterality: Left;     Allergies:  is allergic to ciprofloxacin , procardia [nifedipine], ativan  [lorazepam ], and norvasc [amlodipine besylate].  Medications:  Current Outpatient Medications  Medication Sig Dispense Refill   acetaminophen  (TYLENOL ) 500 MG tablet Take 1,000 mg by mouth every 12 (twelve) hours as needed.     alendronate  (FOSAMAX ) 70 MG tablet Take 1 tablet (70 mg total) by mouth once a week. Take with a full glass of water  on an empty stomach. 13 tablet 0   atorvastatin  (LIPITOR) 20 MG tablet TAKE 1 TABLET BY MOUTH DAILY 100 tablet 0   levothyroxine  (SYNTHROID ) 100 MCG tablet TAKE 1 TABLET BY MOUTH DAILY  BEFORE BREAKFAST 100 tablet 0   olmesartan  (BENICAR ) 20 MG tablet Take 1 tablet (20 mg total) by mouth daily. For blood pressure 90 tablet 1   OVER THE COUNTER MEDICATION Take 1 tablet by mouth daily. Megaboost (Patient taking differently: Take 1 tablet by mouth daily. Megaboost- branched chain amino acids 2:1:1, L-Lleucine, L-Isoleucine, L-Valine, Bacopa Monnieri  Extract, Thodiola RoRosea Root powder (3% [place]), L theanine, Panax Ginseng Extract)     No current facility-administered medications for this visit.     Physical Examination: ECOG PERFORMANCE STATUS: 2 - Symptomatic, <50% confined to bed  Vitals:   12/29/24 1356  BP: 106/65  Pulse: (!) 117  Resp: 16  Temp: 98 F (36.7 C)  SpO2: 98%   Filed Weights   12/29/24 1356  Weight: 143 lb 1.3 oz (64.9 kg)    GENERAL:alert, no distress and comfortable SKIN: skin color, texture, turgor are normal, no rashes or significant lesions BREAST: Left breast: 2 x 1 cms lump in the outer lower quadrant below the nipple, non tender. No lymphoadenopathy palpated. Right breast : no palpable breast lump. LYMPH:  no palpable lymphadenopathy in the cervical, axillary or inguinal LUNGS: clear to auscultation and percussion with normal breathing effort HEART: regular rate & rhythm and no murmurs and B/L pitting pedal edema 1+ ABDOMEN:abdomen soft, non-tender and normal bowel sounds PSYCH: alert & oriented x 3 with fluent speech NEURO: no focal motor/sensory deficits   Laboratory Data: I have reviewed the data as listed Lab Results  Component Value Date   WBC 8.3 06/19/2024   HGB 13.4 06/19/2024   HCT 41.3 06/19/2024   MCV  91 06/19/2024   PLT 271 06/19/2024     Chemistry      Component Value Date/Time   NA 142 06/19/2024 1220   K 4.1 06/19/2024 1220   CL 105 06/19/2024 1220   CO2 19 (L) 06/19/2024 1220   BUN 9 06/19/2024 1220   CREATININE 1.16 (H) 06/19/2024 1220   CREATININE 1.59 (H) 01/27/2016 1135      Component Value Date/Time   CALCIUM  9.2 06/19/2024 1220   ALKPHOS 126 (H) 06/19/2024 1220   AST 15 06/19/2024 1220   ALT 8 06/19/2024 1220   BILITOT 0.7 06/19/2024 1220       Radiographic Studies: I have personally reviewed the radiological images as listed and agreed with the findings in the report.   ASSESSMENT & PLAN:  Patient is a 79 y.o. female presenting for Stage  I ER/PR negative, HER2 positive  Assessment and Plan Assessment & Plan Early stage HER2-positive left breast cancer Stage I HER2-positive left breast cancer, 1.8 cm mass on imaging, 10 mm on biopsy, no nodal involvement. HER2 positivity allows for targeted therapy.  Good health status supports standard treatment approach.  - Confirmed diagnosis and reviewed treatment plan: initial surgery, adjuvant chemotherapy, HER2-targeted therapy. - If lymph nodes negative at surgery: four cycles TC regimen chemotherapy with trastuzumab for one year, including maintenance. - If lymph nodes positive: Will add pertuzumab to regimen. - Reviewed side effects: chemotherapy (cytopenias, alopecia, neuropathy); trastuzumab (cardiac dysfunction risk). - Ordered baseline echocardiogram; repeat every three months during trastuzumab therapy. - Advised dental work completion before chemotherapy if feasible; otherwise, postpone until trastuzumab maintenance. - Continue current medications (levothyroxine , atorvastatin , alendronate , olmesartan ) during chemotherapy. - Arranged chemotherapy education session with nursing staff pre-chemotherapy. - Provided educational materials on diagnosis and treatment plan. - Will request Dr.Jenkins for a port placement at the time of surgery.   Return to clinic after surgery to discuss results and further management   No orders of the defined types were placed in this encounter.   The total time spent in the appointment was 60 minutes encounter with patients including review of chart and various tests results, discussions about plan of care and coordination of care plan   All questions were answered. The patient knows to call the clinic with any problems, questions or concerns. No barriers to learning was detected.  Mickiel Dry, MD 1/19/20262:59 PM "

## 2024-12-29 ENCOUNTER — Inpatient Hospital Stay: Attending: Oncology | Admitting: Oncology

## 2024-12-29 ENCOUNTER — Encounter: Payer: Self-pay | Admitting: Oncology

## 2024-12-29 ENCOUNTER — Inpatient Hospital Stay

## 2024-12-29 VITALS — BP 106/65 | HR 117 | Temp 98.0°F | Resp 16 | Wt 143.1 lb

## 2024-12-29 DIAGNOSIS — Z79890 Hormone replacement therapy (postmenopausal): Secondary | ICD-10-CM | POA: Insufficient documentation

## 2024-12-29 DIAGNOSIS — C50512 Malignant neoplasm of lower-outer quadrant of left female breast: Secondary | ICD-10-CM | POA: Insufficient documentation

## 2024-12-29 DIAGNOSIS — Z79899 Other long term (current) drug therapy: Secondary | ICD-10-CM | POA: Insufficient documentation

## 2024-12-29 DIAGNOSIS — Z1722 Progesterone receptor negative status: Secondary | ICD-10-CM | POA: Diagnosis not present

## 2024-12-29 DIAGNOSIS — E785 Hyperlipidemia, unspecified: Secondary | ICD-10-CM | POA: Diagnosis not present

## 2024-12-29 DIAGNOSIS — Z7983 Long term (current) use of bisphosphonates: Secondary | ICD-10-CM | POA: Insufficient documentation

## 2024-12-29 DIAGNOSIS — Z171 Estrogen receptor negative status [ER-]: Secondary | ICD-10-CM | POA: Diagnosis not present

## 2024-12-29 DIAGNOSIS — N182 Chronic kidney disease, stage 2 (mild): Secondary | ICD-10-CM | POA: Diagnosis not present

## 2024-12-29 DIAGNOSIS — F419 Anxiety disorder, unspecified: Secondary | ICD-10-CM | POA: Diagnosis not present

## 2024-12-29 DIAGNOSIS — I129 Hypertensive chronic kidney disease with stage 1 through stage 4 chronic kidney disease, or unspecified chronic kidney disease: Secondary | ICD-10-CM | POA: Diagnosis not present

## 2024-12-29 DIAGNOSIS — E039 Hypothyroidism, unspecified: Secondary | ICD-10-CM | POA: Insufficient documentation

## 2024-12-29 DIAGNOSIS — Z1731 Human epidermal growth factor receptor 2 positive status: Secondary | ICD-10-CM | POA: Diagnosis not present

## 2024-12-29 DIAGNOSIS — C50912 Malignant neoplasm of unspecified site of left female breast: Secondary | ICD-10-CM | POA: Insufficient documentation

## 2024-12-29 NOTE — Patient Instructions (Addendum)
 You were seen and examined today by Dr. Davonna. Dr. Davonna is a medical oncologist, meaning that she specializes in the treatment of cancer diagnoses. Dr. Davonna discussed your past medical history, family history of cancers, and the events that led to you being here today.  She discussed with you treatment for your breast cancer with a combination of medications. Those drugs are Taxotere, carboplatin, and Herceptin. These are given in the clinic one day every 3 weeks. You will be treated for a total of 6 times.   We will arrange for you to have an education session with one of our nurses to discuss side effects you may experience and how to manage those if they should arise.   We will arrange for you to have a port a cath placed to administer these drugs. We will ask Dr. Mavis to place this when he does your breast surgery.   We will plan to start your treatment 4 weeks after your surgery.   Return as scheduled.

## 2024-12-31 ENCOUNTER — Ambulatory Visit (HOSPITAL_COMMUNITY)
Admission: RE | Admit: 2024-12-31 | Discharge: 2024-12-31 | Disposition: A | Discharge status: Home / Self Care | Source: Ambulatory Visit | Attending: General Surgery | Admitting: General Surgery

## 2024-12-31 DIAGNOSIS — Z01810 Encounter for preprocedural cardiovascular examination: Secondary | ICD-10-CM | POA: Diagnosis not present

## 2024-12-31 DIAGNOSIS — I10 Essential (primary) hypertension: Secondary | ICD-10-CM | POA: Diagnosis not present

## 2024-12-31 DIAGNOSIS — Z01818 Encounter for other preprocedural examination: Secondary | ICD-10-CM

## 2024-12-31 LAB — ECHOCARDIOGRAM COMPLETE
AV Mean grad: 5 mmHg
AV Peak grad: 10.8 mmHg
Ao pk vel: 1.64 m/s
Area-P 1/2: 3.17 cm2
S' Lateral: 2.4 cm
Single Plane A2C EF: 60.9 %

## 2025-01-03 ENCOUNTER — Encounter: Payer: Self-pay | Admitting: Family Medicine

## 2025-01-08 ENCOUNTER — Other Ambulatory Visit

## 2025-01-08 ENCOUNTER — Encounter: Payer: Self-pay | Admitting: General Surgery

## 2025-01-08 ENCOUNTER — Other Ambulatory Visit: Payer: Self-pay

## 2025-01-08 ENCOUNTER — Ambulatory Visit: Admitting: General Surgery

## 2025-01-08 VITALS — BP 137/75 | HR 94 | Temp 98.2°F | Resp 12 | Ht 66.0 in | Wt 140.0 lb

## 2025-01-08 DIAGNOSIS — N182 Chronic kidney disease, stage 2 (mild): Secondary | ICD-10-CM

## 2025-01-08 DIAGNOSIS — Z171 Estrogen receptor negative status [ER-]: Secondary | ICD-10-CM

## 2025-01-08 DIAGNOSIS — C50512 Malignant neoplasm of lower-outer quadrant of left female breast: Secondary | ICD-10-CM

## 2025-01-08 DIAGNOSIS — E782 Mixed hyperlipidemia: Secondary | ICD-10-CM

## 2025-01-08 DIAGNOSIS — E039 Hypothyroidism, unspecified: Secondary | ICD-10-CM

## 2025-01-08 LAB — CBC WITH DIFFERENTIAL/PLATELET
Basophils Absolute: 0 10*3/uL (ref 0.0–0.2)
Basos: 1 %
EOS (ABSOLUTE): 0.1 10*3/uL (ref 0.0–0.4)
Eos: 2 %
Hematocrit: 42 % (ref 34.0–46.6)
Hemoglobin: 14.3 g/dL (ref 11.1–15.9)
Immature Grans (Abs): 0 10*3/uL (ref 0.0–0.1)
Immature Granulocytes: 0 %
Lymphocytes Absolute: 1.2 10*3/uL (ref 0.7–3.1)
Lymphs: 18 %
MCH: 32.4 pg (ref 26.6–33.0)
MCHC: 34 g/dL (ref 31.5–35.7)
MCV: 95 fL (ref 79–97)
Monocytes Absolute: 0.5 10*3/uL (ref 0.1–0.9)
Monocytes: 8 %
Neutrophils Absolute: 4.5 10*3/uL (ref 1.4–7.0)
Neutrophils: 70 %
Platelets: 241 10*3/uL (ref 150–450)
RBC: 4.42 x10E6/uL (ref 3.77–5.28)
RDW: 11.8 % (ref 11.7–15.4)
WBC: 6.3 10*3/uL (ref 3.4–10.8)

## 2025-01-08 LAB — COMPREHENSIVE METABOLIC PANEL WITH GFR
ALT: 9 [IU]/L (ref 0–32)
AST: 17 [IU]/L (ref 0–40)
Albumin: 4.2 g/dL (ref 3.8–4.8)
Alkaline Phosphatase: 124 [IU]/L (ref 49–135)
BUN/Creatinine Ratio: 7 — ABNORMAL LOW (ref 12–28)
BUN: 8 mg/dL (ref 8–27)
Bilirubin Total: 1 mg/dL (ref 0.0–1.2)
CO2: 18 mmol/L — ABNORMAL LOW (ref 20–29)
Calcium: 9.1 mg/dL (ref 8.7–10.3)
Chloride: 99 mmol/L (ref 96–106)
Creatinine, Ser: 1.09 mg/dL — ABNORMAL HIGH (ref 0.57–1.00)
Globulin, Total: 2.5 g/dL (ref 1.5–4.5)
Glucose: 85 mg/dL (ref 70–99)
Potassium: 4.3 mmol/L (ref 3.5–5.2)
Sodium: 135 mmol/L (ref 134–144)
Total Protein: 6.7 g/dL (ref 6.0–8.5)
eGFR: 52 mL/min/{1.73_m2} — ABNORMAL LOW

## 2025-01-08 LAB — LIPID PANEL
Chol/HDL Ratio: 1.9 ratio (ref 0.0–4.4)
Cholesterol, Total: 149 mg/dL (ref 100–199)
HDL: 78 mg/dL
LDL Chol Calc (NIH): 45 mg/dL (ref 0–99)
Triglycerides: 157 mg/dL — ABNORMAL HIGH (ref 0–149)
VLDL Cholesterol Cal: 26 mg/dL (ref 5–40)

## 2025-01-08 LAB — TSH+FREE T4
Free T4: 2.01 ng/dL — ABNORMAL HIGH (ref 0.82–1.77)
TSH: 0.023 u[IU]/mL — ABNORMAL LOW (ref 0.450–4.500)

## 2025-01-09 NOTE — Progress Notes (Signed)
 Patient returns to schedule surgery.  She has been seen by Dr. Davonna of oncology and will undergo chemotherapy after surgery.  She would like to proceed with a left partial mastectomy with sentinel lymph node biopsy, Port-A-Cath placement.  The risks and benefits of the procedure including bleeding, infection, cardiopulmonary difficulties, and the possibility of unclear margins were fully explained to the patient, who gave informed consent.  Surgery is scheduled for 01/15/2025.

## 2025-01-09 NOTE — H&P (Signed)
 [name]; [id number]; 04-28-1946   HPI Patient is a 79 year old white female who is referred to my care by Butler Der for evaluation and treatment of a left breast cancer.  This was recently diagnosed by screening mammography.  A 1.8 cm mass at the 3 o'clock position in the left breast was biopsy positive for invasive mammary carcinoma, ER/PR negative, HER2 positive.  Patient denies any history of breast cancer personally or in her family.  Patient has an appointment with oncology on 12/29/2024. Past Medical History:  Diagnosis Date   Acute pyelonephritis 10/15/2019   Acute renal failure superimposed on stage 2 chronic kidney disease 10/15/2019   Alcoholism (HCC) 01/09/2012   Anxiety    Arthritis    Left knee   B12 deficiency    Carotid artery narrowing 10/03/2012   On the right.   Chronic kidney disease (CKD), stage II (mild)    Depression    Headache(784.0)    Hyperlipidemia    Hypertension    Hyponatremia 01/09/2012   Hypothyroid    Incontinence of urine in female    Menopause    Mini stroke    Orthostatic syncope 10/02/2012   SBO (small bowel obstruction) (HCC) 06/28/2016   Stroke (HCC) 2010   no deficits   Thrombocytopenia 10/15/2019    Past Surgical History:  Procedure Laterality Date   AGILE CAPSULE N/A 02/22/2016   dummy capsule remained in the distal ileum   APPENDECTOMY     BREAST BIOPSY Left 11/25/2024   US  LT BREAST BX W LOC DEV 1ST LESION IMG BX SPEC US  GUIDE 11/25/2024 Mir, Aliene SAUNDERS, MD [facility]   BREAST BIOPSY Left 11/25/2024   US  LT BREAST BX W LOC DEV EA ADD LESION IMG BX SPEC US  GUIDE 11/25/2024 Mir, Aliene SAUNDERS, MD [facility]   CATARACT EXTRACTION W/PHACO  06/20/2012   Procedure: CATARACT EXTRACTION PHACO AND INTRAOCULAR LENS PLACEMENT (IOC);  Surgeon: Cherene Mania, MD;  Location: [facility];  Service: Ophthalmology;  Laterality: Right;  CDE:10.66   CATARACT EXTRACTION W/PHACO  07/11/2012   Procedure: CATARACT EXTRACTION PHACO AND INTRAOCULAR LENS  PLACEMENT (IOC);  Surgeon: Cherene Mania, MD;  Location: [facility];  Service: Ophthalmology;  Laterality: Left;  CDE: 12.69   COLONOSCOPY N/A 12/17/2015   RMR: Marked circumferential ulceration of the ascending colon/Ileocecal valve ulceration with biopsy most consistent with NSAID injury versus inflammatory bowel disease, pancolonoc diverticulosis redundant colon   ESOPHAGOGASTRODUODENOSCOPY N/A 12/17/2015   MFM:lorzmjuzi gastric polyp, biopsy benign   KNEE ARTHROSCOPY Right 09/2016   LUMBAR LAMINECTOMY/DECOMPRESSION MICRODISCECTOMY N/A 05/25/2017   Procedure: CENTRAL DECOMPRESSIVE LUMBAR LAMINECTOMY FOR SPINAL STENOSIS L3-L4, FORAMINOTOMY FOR THE L4 ROOT AND L5 ROOT;  Surgeon: Heide Ingle, MD;  Location: [facility];  Service: Orthopedics;  Laterality: N/A;   TONSILLECTOMY     TOTAL KNEE ARTHROPLASTY Left 10/31/2021   Procedure: TOTAL KNEE ARTHROPLASTY;  Surgeon: Melodi Lerner, MD;  Location: [facility];  Service: Orthopedics;  Laterality: Left;    Family History  Problem Relation Age of Onset   GER disease Mother    Ulcers Mother    Hypertension Maternal Grandmother    Breast cancer Neg Hx     [Medications Ordered Prior to [employer] Ordered Prior to [employer] Current Outpatient Medications on File Prior to Visit  Medication Sig Dispense Refill   acetaminophen  (TYLENOL ) 500 MG tablet Take 1,000 mg by mouth every 12 (twelve) hours as needed.     atorvastatin  (LIPITOR) 20 MG tablet TAKE 1 TABLET BY MOUTH DAILY 100 tablet  0   levothyroxine  (SYNTHROID ) 100 MCG tablet TAKE 1 TABLET BY MOUTH DAILY  BEFORE BREAKFAST 100 tablet 0   OVER THE COUNTER MEDICATION Take 1 tablet by mouth daily. Megaboost     alendronate  (FOSAMAX ) 70 MG tablet Take 1 tablet (70 mg total) by mouth once a week. Take with a full glass of water  on an empty stomach. (Patient not taking: Reported on 12/18/2024) 13 tablet 0   Multiple Vitamin (MULTIVITAMIN) tablet Take 1 tablet by mouth daily. (Patient not taking: Reported  on 12/18/2024)     Multiple Vitamins-Minerals (OCUVITE ADULT 50+) CAPS Take by mouth. (Patient not taking: Reported on 12/18/2024)     olmesartan  (BENICAR ) 20 MG tablet Take 1 tablet (20 mg total) by mouth daily. For blood pressure (Patient not taking: Reported on 12/18/2024) 90 tablet 1   No current facility-administered medications on file prior to visit.    [Allergies]  [Allergies] Allergen Reactions   Ciprofloxacin  Other (See Comments)    Caused pt to have delusional thoughts and hallucinations   Procardia [Nifedipine] Swelling    Edema     Ativan  [Lorazepam ]    Norvasc [Amlodipine Besylate] Hives    Social History   Substance and Sexual Activity  Alcohol Use No    [Tobacco Use History]  [Tobacco Use History] Tobacco Use  Smoking Status Former   Current packs/day: 0.00   Average packs/day: 3.0 packs/day for 30.0 years (90.0 ttl pk-yrs)   Types: Cigarettes   Start date: 06/17/1962   Quit date: 06/17/1992   Years since quitting: 32.5  Smokeless Tobacco Never  Tobacco Comments   3 packs a day x 30 years former smoker.  Not currently smoking    Review of Systems  Constitutional: Negative.   HENT: Negative.    Eyes: Negative.   Cardiovascular:  Positive for palpitations.  Gastrointestinal: Negative.   Genitourinary: Negative.   Musculoskeletal:  Positive for back pain, joint pain and neck pain.  Skin: Negative.   Neurological: Negative.   Endo/Heme/Allergies: Negative.   Psychiatric/Behavioral: Negative.      Objective   Vitals:   12/18/24 0915  BP: 136/82  Pulse: (!) 106  Resp: 14  Temp: 98.2 F (36.8 C)  SpO2: 97%    Physical Exam Vitals reviewed.  Constitutional:      Appearance: Normal appearance. She is normal weight. She is not ill-appearing.  HENT:     Head: Normocephalic and atraumatic.  Cardiovascular:     Rate and Rhythm: Normal rate and regular rhythm.     Heart sounds: Normal heart sounds. No murmur heard.    No friction rub. No gallop.   Pulmonary:     Effort: Pulmonary effort is normal. No respiratory distress.     Breath sounds: Normal breath sounds. No stridor. No wheezing, rhonchi or rales.  Skin:    General: Skin is warm and dry.  Neurological:     Mental Status: She is alert and oriented to person, place, and time.   Breast: Dominant 2 cm mass noted at the 3 o'clock position in the left breast.  Axilla negative for palpable nodes.  No dominant mass, nipple discharge, or dimpling is noted in the right breast.  Axilla negative for palpable nodes.   Results Surgical pathology (Order [id number]) MyChart Results Release  MyChart Status: Active (w/ proxy users)  Results Release   Surgical pathology Order: [id number]  Status: Edited Result - FINAL     Next appt: 12/29/2024 at 01:40 PM in Oncology Felicita Dry, MD)  Dx: Abnormal mammogram   Test Result Released: Yes (seen)   0 Result Notes    Component Ref Range & Units (hover) 3 wk ago  SURGICAL PATHOLOGY SURGICAL PATHOLOGY * THIS IS AN ADDENDUM REPORT * CASE: [id number] PATIENT: Kyliana Rotter Surgical Pathology Report *Addendum *  Reason for Addendum #1:  Breast Biomarker Results  Clinical History: Left breast mass- 0.6cm- 4:00 3 CMFN, left breast mass- 1.8cm- 3:00 4 CMFN, 78 YOF with 2 suspicious left breast masses presents for US  guided biopsy pf both masses, abnormal mammogram [R92.8] (las)     FINAL MICROSCOPIC DIAGNOSIS:  A. BREAST MASS, LEFT 4:00 3CM FN (WING), NEEDLE CORE BIOPSY: - Invasive mammary carcinoma, no special type (ductal). - Tubule formation: Score 2 - Nuclear pleomorphism: Score 3 - Mitotic count: Score 3 - Total score: 8 - Overall Grade: 3 - Lymphovascular invasion: Not identified - Cancer Length: 10 mm - Calcifications: Not identified - Ductal carcinoma in situ: Not identified - See comment.  B. BREAST MASS, LEFT 3:00 4 CM FN (RIBBON), NEEDLE CORE BIOPSY: - Benign lymph node, negative for  malignancy.  Comment: These results were communicated to Mliss Molt, RN at [facility] on 11/26/2024. ER, PR, HER2, and Ki67 will be performed on block A1 and reported in an addendum. This case underwent intradepartmental consultation and Dr. Macie concurs with the interpretation.  GROSS DESCRIPTION:  A.  Received in formalin labeled with the patient's name and left 4:00 are 4 tan-pink needle core biopsy fragments ranging from 1.3 to 1.7 cm in length by 0.2 cm in diameter.  The specimen is entirely submitted in A1.  Time in formalin: 8:30 AM on 11/25/2024.  Cold ischemic time: Less than 1 minute.  B.  Received in formalin labeled with the patient's name and left 3:00 are 4 pink-red needle core biopsy fragments ranging from 0.5 to 1.4 cm in length by 0.2 cm in diameter.  The specimen is entirely submitted in B1. [facility] 11/25/2024)   Final Diagnosis performed by Rexene Daily, MD.   Electronically signed 11/26/2024 Technical component performed at [facility], [address], [place], [place] [address].  Professional component performed at [facility]. [address] [place], [place] [address]  Immunohistochemistry Technical component (if applicable) was performed at [employer]. [address], [place], [place] [address].  IMMUNOHISTOCHEMISTRY DISCLAIMER (if applicable): Some of these immunohistochemical stains may have been developed and the performance characteristics determine by [facility]. Some may not have been cleared or approved by the U.S. Food and Drug Administration. The FDA has determined that such clearance or approval is not necessary. This test is used for clinical purposes. It should not be regarded as investigational or for research. This laboratory is certified under the Clinical Laboratory Improvement Amendments of 1988 (CLIA-88) as qualified to  perform high complexity clinical laboratory testing.  The controls stained appropriately.  ADDENDUM:  Breast, left, needle core biopsy  PROGNOSTIC INDICATORS  Results: IMMUNOHISTOCHEMICAL AND MORPHOMETRIC ANALYSIS PERFORMED MANUALLY  The tumor cells are positive for Her2 (3+).  Estrogen Receptor:  0%, Negative Progesterone Receptor:  0%, Negative Proliferation Marker Ki67: 40%  COMMENT:  The negative hormone receptor study(ies) in this case has an internal positive control.   REFERENCE RANGE ESTROGEN RECEPTOR        NEGATIVE     0%        POSITIVE       =>1% REFERENCE RANGE PROGESTERONE RECEPTOR  NEGATIVE     0%        POSITIVE        =>1%  All controls stained appropriately          Addendum #1 performed by Pepper Dutton, MD.   Electronically signed 11/27/2024 Technical component performed at [facility], [address] [place], [place] [address].  Professional component performed at [employer]. [facility], [address], [place], [place] [address].  Immunohistochemistry Technical component (if applicable) was performed at [facility]. [address], [place], [place] [address].   IMMUNOHISTOCHEMISTRY DISCLAIMER (if applicable): Some of these immunohistochemical stains may have been developed and the performance characteristics determine by [facility]. Some may not have been cleared or approved by the U.S. Food and Drug Administration. The FDA has determined that such clearance or approval is not necessary. This test is used for clinical purposes. It should not be regarded as investigational or for research. This laboratory is certified under the Clinical Laboratory Improvement Amendments of 1988 (CLIA-88) as qualified to perform high complexity clinical laboratory testing.  The controls stained appropriately.   IHC stains are performed on formalin fixed, paraffin embedded tissue using a  3,3diaminobenzidine (DAB) chromogen and Leica Bond Autostainer System. The staining intensity of the nucleus is score manually and is reported as the percentage of tumor cell nuclei demonstrating specific nuclear staining. The specimens are fixed in 10% Neutral Formalin for at least 6 hours and up to 72hrs. These tests are validated on decalcified tissue. Results should be interpreted with caution given the possibility of false negative results on decalcified specimens. Antibody Clones are as follows ER-clone 70F, PR-clone 16, Ki67- clone MM1. Some of these immunohistochemical stains may have been developed and the performance characteristics determined by [facility].  Resulting Agency [facility] PATH LAB        Specimen Collected: 11/25/24 09:01 Last Resulted: 11/27/24 12:46    Assessment  Left breast carcinoma, ER/PR negative, HER2 positive.  1.8 cm in its greatest diameter.  Lymph node in this region negative by biopsy. Plan  I told the patient her surgical options included a left partial mastectomy with sentinel lymph node biopsy versus a left modified radical mastectomy.  Given her age, she will probably not need radiation therapy.  She will be seeing Dr. Davonna of Oncology on 12/29/2024.  She states that she will make her decision after that visit.  In the meantime, we will get a 2D echo to further evaluate her cardiac status. Addendum: Patient has decided undergo a left partial mastectomy with sentinel lymph node biopsy, Port-A-Cath insertion on 01/15/2025.  The risks and benefits of the procedure including bleeding, infection, cardiopulmonary difficulties, pneumothorax, and the possibility of unclear margins were fully explained to the patient, who gave informed consent.

## 2025-01-12 ENCOUNTER — Other Ambulatory Visit: Payer: Self-pay

## 2025-01-13 ENCOUNTER — Encounter (HOSPITAL_COMMUNITY): Payer: Self-pay

## 2025-01-13 ENCOUNTER — Encounter (HOSPITAL_COMMUNITY)
Admission: RE | Admit: 2025-01-13 | Discharge: 2025-01-13 | Disposition: A | Source: Ambulatory Visit | Attending: General Surgery

## 2025-01-15 ENCOUNTER — Ambulatory Visit (HOSPITAL_COMMUNITY): Admitting: Certified Registered Nurse Anesthetist

## 2025-01-15 ENCOUNTER — Ambulatory Visit (HOSPITAL_COMMUNITY)

## 2025-01-15 ENCOUNTER — Other Ambulatory Visit: Payer: Self-pay

## 2025-01-15 ENCOUNTER — Encounter (HOSPITAL_COMMUNITY): Admission: RE | Disposition: A | Payer: Self-pay | Source: Home / Self Care | Attending: General Surgery

## 2025-01-15 ENCOUNTER — Encounter (HOSPITAL_COMMUNITY): Payer: Self-pay | Admitting: General Surgery

## 2025-01-15 ENCOUNTER — Ambulatory Visit (HOSPITAL_COMMUNITY)
Admission: RE | Admit: 2025-01-15 | Discharge: 2025-01-15 | Disposition: A | Source: Home / Self Care | Attending: General Surgery | Admitting: General Surgery

## 2025-01-15 DIAGNOSIS — C50912 Malignant neoplasm of unspecified site of left female breast: Secondary | ICD-10-CM

## 2025-01-15 DIAGNOSIS — Z01818 Encounter for other preprocedural examination: Secondary | ICD-10-CM

## 2025-01-15 MED ORDER — SUCCINYLCHOLINE CHLORIDE 200 MG/10ML IV SOSY
PREFILLED_SYRINGE | INTRAVENOUS | Status: AC
  Filled 2025-01-15: qty 10

## 2025-01-15 MED ORDER — ONDANSETRON HCL 4 MG/2ML IJ SOLN: 4.0000 mg | Freq: Once | INTRAMUSCULAR | Status: DC | PRN

## 2025-01-15 MED ORDER — PHENYLEPHRINE 80 MCG/ML (10ML) SYRINGE FOR IV PUSH (FOR BLOOD PRESSURE SUPPORT)
PREFILLED_SYRINGE | INTRAVENOUS | Status: DC | PRN
  Administered 2025-01-15: 160 ug via INTRAVENOUS
  Administered 2025-01-15: 80 ug via INTRAVENOUS
  Administered 2025-01-15 (×2): 160 ug via INTRAVENOUS
  Administered 2025-01-15 (×3): 80 ug via INTRAVENOUS
  Administered 2025-01-15 (×2): 160 ug via INTRAVENOUS

## 2025-01-15 MED ORDER — SODIUM CHLORIDE (PF) 0.9 % IJ SOLN
INTRAMUSCULAR | Status: DC | PRN
  Administered 2025-01-15: 500 mL

## 2025-01-15 MED ORDER — EPHEDRINE 5 MG/ML INJ
INTRAVENOUS | Status: AC
  Filled 2025-01-15: qty 5

## 2025-01-15 MED ORDER — HEPARIN SOD (PORK) LOCK FLUSH 100 UNIT/ML IV SOLN
INTRAVENOUS | Status: AC
  Filled 2025-01-15: qty 5

## 2025-01-15 MED ORDER — MAGTRACE LYMPHATIC TRACER
INTRAMUSCULAR | Status: DC | PRN
  Administered 2025-01-15: 2 mL via INTRAMUSCULAR

## 2025-01-15 MED ORDER — TRAMADOL HCL 50 MG PO TABS: 50.0000 mg | ORAL_TABLET | Freq: Two times a day (BID) | ORAL | 0 refills | Status: AC | PRN

## 2025-01-15 MED ORDER — CHLORHEXIDINE GLUCONATE 0.12 % MT SOLN
15.0000 mL | Freq: Once | OROMUCOSAL | Status: AC
  Administered 2025-01-15: 15 mL via OROMUCOSAL

## 2025-01-15 MED ORDER — LIDOCAINE 2% (20 MG/ML) 5 ML SYRINGE
INTRAMUSCULAR | Status: DC | PRN
  Administered 2025-01-15: 60 mg via INTRAVENOUS

## 2025-01-15 MED ORDER — FENTANYL CITRATE (PF) 100 MCG/2ML IJ SOLN
INTRAMUSCULAR | Status: DC | PRN
  Administered 2025-01-15: 100 ug via INTRAVENOUS
  Administered 2025-01-15: 50 ug via INTRAVENOUS
  Administered 2025-01-15: 25 ug via INTRAVENOUS
  Administered 2025-01-15: 50 ug via INTRAVENOUS
  Administered 2025-01-15: 25 ug via INTRAVENOUS

## 2025-01-15 MED ORDER — FENTANYL CITRATE (PF) 100 MCG/2ML IJ SOLN
INTRAMUSCULAR | Status: AC
  Filled 2025-01-15: qty 2

## 2025-01-15 MED ORDER — PROPOFOL 10 MG/ML IV BOLUS
INTRAVENOUS | Status: AC
  Filled 2025-01-15: qty 20

## 2025-01-15 MED ORDER — CHLORHEXIDINE GLUCONATE 0.12 % MT SOLN
OROMUCOSAL | Status: AC
  Administered 2025-01-15: 15 mL
  Filled 2025-01-15: qty 15

## 2025-01-15 MED ORDER — BUPIVACAINE HCL (PF) 0.5 % IJ SOLN
INTRAMUSCULAR | Status: DC | PRN
  Administered 2025-01-15: 30 mL

## 2025-01-15 MED ORDER — PROPOFOL 10 MG/ML IV BOLUS
INTRAVENOUS | Status: DC | PRN
  Administered 2025-01-15: 120 mg via INTRAVENOUS
  Administered 2025-01-15: 40 mg via INTRAVENOUS

## 2025-01-15 MED ORDER — DEXAMETHASONE SOD PHOSPHATE PF 10 MG/ML IJ SOLN
INTRAMUSCULAR | Status: DC | PRN
  Administered 2025-01-15: 5 mg via INTRAVENOUS

## 2025-01-15 MED ORDER — ONDANSETRON HCL 4 MG/2ML IJ SOLN
INTRAMUSCULAR | Status: DC | PRN
  Administered 2025-01-15: 4 mg via INTRAVENOUS

## 2025-01-15 MED ORDER — DEXAMETHASONE SOD PHOSPHATE PF 10 MG/ML IJ SOLN
INTRAMUSCULAR | Status: AC
  Filled 2025-01-15: qty 1

## 2025-01-15 MED ORDER — HEPARIN SOD (PORK) LOCK FLUSH 100 UNIT/ML IV SOLN
INTRAVENOUS | Status: DC | PRN
  Administered 2025-01-15: 500 [IU] via INTRAVENOUS

## 2025-01-15 MED ORDER — CHLORHEXIDINE GLUCONATE CLOTH 2 % EX PADS: 6.0000 | MEDICATED_PAD | Freq: Once | CUTANEOUS | Status: DC

## 2025-01-15 MED ORDER — CEFAZOLIN SODIUM-DEXTROSE 2-4 GM/100ML-% IV SOLN
2.0000 g | INTRAVENOUS | Status: AC
  Administered 2025-01-15: 2 g via INTRAVENOUS
  Filled 2025-01-15: qty 100

## 2025-01-15 MED ORDER — LIDOCAINE HCL (PF) 1 % IJ SOLN
INTRAMUSCULAR | Status: AC
  Filled 2025-01-15: qty 30

## 2025-01-15 MED ORDER — SUCCINYLCHOLINE CHLORIDE 200 MG/10ML IV SOSY
PREFILLED_SYRINGE | INTRAVENOUS | Status: DC | PRN
  Administered 2025-01-15: 120 mg via INTRAVENOUS

## 2025-01-15 MED ORDER — EPHEDRINE SULFATE (PRESSORS) 25 MG/5ML IV SOSY
PREFILLED_SYRINGE | INTRAVENOUS | Status: DC | PRN
  Administered 2025-01-15: 10 mg via INTRAVENOUS

## 2025-01-15 MED ORDER — BUPIVACAINE HCL (PF) 0.5 % IJ SOLN
INTRAMUSCULAR | Status: AC
  Filled 2025-01-15: qty 30

## 2025-01-15 MED ORDER — LACTATED RINGERS IV SOLN: INTRAVENOUS | Status: DC

## 2025-01-15 MED ORDER — OXYCODONE HCL 5 MG/5ML PO SOLN: 5.0000 mg | Freq: Once | ORAL | Status: DC | PRN

## 2025-01-15 MED ORDER — FENTANYL CITRATE (PF) 50 MCG/ML IJ SOSY: 25.0000 ug | PREFILLED_SYRINGE | INTRAMUSCULAR | Status: DC | PRN

## 2025-01-15 MED ORDER — OXYCODONE HCL 5 MG PO TABS: 5.0000 mg | ORAL_TABLET | Freq: Once | ORAL | Status: DC | PRN

## 2025-01-15 MED ORDER — ONDANSETRON HCL 4 MG/2ML IJ SOLN
INTRAMUSCULAR | Status: AC
  Filled 2025-01-15: qty 2

## 2025-01-15 MED ORDER — PHENYLEPHRINE 80 MCG/ML (10ML) SYRINGE FOR IV PUSH (FOR BLOOD PRESSURE SUPPORT)
PREFILLED_SYRINGE | INTRAVENOUS | Status: AC
  Filled 2025-01-15: qty 10

## 2025-01-15 MED ORDER — ORAL CARE MOUTH RINSE: 15.0000 mL | Freq: Once | OROMUCOSAL | Status: AC

## 2025-01-15 NOTE — Interval H&P Note (Signed)
 History and Physical Interval Note:  01/15/2025 7:12 AM  [name]  has presented today for surgery, with the diagnosis of MALIGNANT NEOPLSM LEFT BREAST ER NEGATIVE.  The various methods of treatment have been discussed with the patient and family. After consideration of risks, benefits and other options for treatment, the patient has consented to  Procedures: BREAST LUMPECTOMY (Left) BIOPSY, LYMPH NODE, SENTINEL, AXILLARY (Left) INSERTION, TUNNELED CENTRAL VENOUS DEVICE, WITH PORT (Right) as a surgical intervention.  The patient's history has been reviewed, patient examined, no change in status, stable for surgery.  I have reviewed the patient's chart and labs.  Questions were answered to the patient's satisfaction.     Oneil Budge

## 2025-01-15 NOTE — Anesthesia Procedure Notes (Signed)
 Procedure Name: Intubation Date/Time: 01/15/2025 7:41 AM  Performed by: Elaine Delon CROME, CRNAPre-anesthesia Checklist: Patient identified, Emergency Drugs available, Suction available and Patient being monitored Patient Re-evaluated:Patient Re-evaluated prior to induction Oxygen  Delivery Method: Circle system utilized Preoxygenation: Pre-oxygenation with 100% oxygen  Induction Type: IV induction Ventilation: Mask ventilation without difficulty Laryngoscope Size: Mac and 3 Grade View: Grade I Tube type: Oral Tube size: 7.0 mm Number of attempts: 1 Airway Equipment and Method: Stylet and Oral airway Placement Confirmation: ETT inserted through vocal cords under direct vision, positive ETCO2 and breath sounds checked- equal and bilateral Secured at: 20 cm Tube secured with: Tape Dental Injury: Teeth and Oropharynx as per pre-operative assessment

## 2025-01-15 NOTE — Anesthesia Preprocedure Evaluation (Signed)
"                                    Anesthesia Evaluation  Patient identified by MRN, date of birth, ID band Patient awake    Reviewed: Allergy & Precautions, H&P , NPO status , Patient's Chart, lab work & pertinent test results, reviewed documented beta blocker date and time   Airway Mallampati: II  TM Distance: >3 FB Neck ROM: full    Dental no notable dental hx.    Pulmonary neg pulmonary ROS, former smoker   Pulmonary exam normal breath sounds clear to auscultation       Cardiovascular Exercise Tolerance: Good hypertension,  Rhythm:regular Rate:Normal     Neuro/Psych  Headaches PSYCHIATRIC DISORDERS Anxiety Depression    CVA    GI/Hepatic Neg liver ROS, PUD,,,  Endo/Other  Hypothyroidism    Renal/GU Renal disease  negative genitourinary   Musculoskeletal   Abdominal   Peds  Hematology  (+) Blood dyscrasia, anemia   Anesthesia Other Findings   Reproductive/Obstetrics negative OB ROS                              Anesthesia Physical Anesthesia Plan  ASA: 2  Anesthesia Plan: General and General ETT   Post-op Pain Management:    Induction:   PONV Risk Score and Plan: Ondansetron   Airway Management Planned:   Additional Equipment:   Intra-op Plan:   Post-operative Plan:   Informed Consent: I have reviewed the patients History and Physical, chart, labs and discussed the procedure including the risks, benefits and alternatives for the proposed anesthesia with the patient or authorized representative who has indicated his/her understanding and acceptance.     Dental Advisory Given  Plan Discussed with: CRNA  Anesthesia Plan Comments:         Anesthesia Quick Evaluation  "

## 2025-01-15 NOTE — Op Note (Signed)
 Patient:  [name]  DOB:  03/22/1946  MRN:  [id number]   Preop Diagnosis: Left breast carcinoma  Postop Diagnosis: Same  Procedure: Left partial mastectomy with sentinel lymph node biopsy, Port-A-Cath placement  Surgeon: Oneil Budge, MD  Anes: General  Indications: Patient is a 79 year old white female who presents with a left breast carcinoma.  She has seen Dr. Davonna of oncology and is proceeding with a left partial mastectomy with sentinel lymph node biopsy, Port-A-Cath placement.  The risks and benefits of the procedures including bleeding, infection, pneumothorax, and unclear margins were fully explained to the patient, who gave informed consent.  Procedure note: The patient was placed in the supine position.  After general anesthesia was administered.  The upper chest was prepped and draped using the usual sterile technique with ChloraPrep.  Surgical site confirmation was performed.  I first proceeded with placing a Port-A-Cath.  An incision was made below the right clavicle.  A subcutaneous pocket was formed.  The needle was advanced into the right subclavian vein using the Seldinger technique without difficulty.  A guidewire was then advanced into the right atrium and under fluoroscopic guidance.  An introducer and peel-away sheath were placed over the guidewire.  The catheter was inserted through the peel-away sheath and the peel-away sheath was removed.  The catheter was then attached to the port and the port placed in subcutaneous pocket.  Adequate positioning was confirmed by fluoroscopy.  Good backflow of venous blood was noted on aspiration of the port.  The port was flushed with heparin  flush.  The subcutaneous layer was reapproximated using a 3-0 Vicryl interrupted suture.  The skin was closed using a 4-0 Monocryl subcuticular suture.  Dermabond was applied at the end of all the procedures.  Next, MAC trace was then instilled in the subareolar region.  It was then  massaged for 5 minutes.  An incision was made in the lower, outer quadrant of the left breast.  The dominant mass was palpated and normal breast tissue was incised around it.  The dissection was taken down to the chest wall.  There was no involvement of the chest wall by the tumor.  A short suture was placed superiorly and a long suture placed laterally for orientation purposes.  The specimen was sent to radiology.  Specimen radiography revealed the previously placed clips within the specimen removed.  It was then sent to pathology further examination.  Next, a sentinel node of the left axillary dissection was performed using the hand-held LigaSure.  I was able to identify 2 lymph nodes.  The tracer counts were Calot.  I then explored the rest of the left axilla and no other palpable lymph nodes were noted.  Those 2 lymph nodes were sent to pathology for further examination.  Both wounds were irrigated with normal saline.  Both wounds were closed with 3-0 Vicryl subcutaneous sutures.  Both incisions were instilled with 0.5% Sensorcaine .  Both skin incisions were closed using a 4-0 Monocryl subcuticular suture.  Dermabond was applied.  All tape and needle counts were correct at the end of the procedures.  The patient was awakened and transferred to PACU in stable condition.  A chest x-ray will be performed at that time.  Complications: None  EBL: 50 cc  Specimen: Left breast mass, left axillary sentinel lymph nodes

## 2025-01-15 NOTE — Anesthesia Postprocedure Evaluation (Signed)
"   Anesthesia Post Note  Patient: [name]  Procedure(s) Performed: BREAST LUMPECTOMY (Left: Breast) BIOPSY, LYMPH NODE, SENTINEL, AXILLARY (Left: Axilla) INSERTION, TUNNELED CENTRAL VENOUS DEVICE, WITH PORT (Right: Chest)  Patient location during evaluation: Phase II Anesthesia Type: General Level of consciousness: awake Pain management: pain level controlled Vital Signs Assessment: post-procedure vital signs reviewed and stable Respiratory status: spontaneous breathing and respiratory function stable Cardiovascular status: blood pressure returned to baseline and stable Postop Assessment: no headache and no apparent nausea or vomiting Anesthetic complications: no Comments: Late entry   No notable events documented.   Last Vitals:  Vitals:   01/15/25 0945 01/15/25 1002  BP: 104/79 (!) 149/66  Pulse: 95 84  Resp: 16 19  Temp:  36.7 C  SpO2: 98% 98%    Last Pain:  Vitals:   01/15/25 1002  TempSrc: Oral  PainSc: 0-No pain                 Yvonna PARAS Sakeena Teall      "

## 2025-01-15 NOTE — Transfer of Care (Signed)
 Immediate Anesthesia Transfer of Care Note  Patient: [name]  Procedure(s) Performed: BREAST LUMPECTOMY (Left: Breast) BIOPSY, LYMPH NODE, SENTINEL, AXILLARY (Left: Axilla) INSERTION, TUNNELED CENTRAL VENOUS DEVICE, WITH PORT (Right: Chest)  Patient Location: PACU  Anesthesia Type:General  Level of Consciousness: awake  Airway & Oxygen  Therapy: Patient Spontanous Breathing and Patient connected to nasal cannula oxygen   Post-op Assessment: Report given to RN and Post -op Vital signs reviewed and stable  Post vital signs: Reviewed and stable  Last Vitals:  Vitals Value Taken Time  BP 142/64   Temp 97.9   Pulse 96 01/15/25 09:22  Resp 15 01/15/25 09:22  SpO2 100 % 01/15/25 09:22  Vitals shown include unfiled device data.  Last Pain:  Vitals:   01/15/25 0659  TempSrc: Oral  PainSc: 0-No pain         Complications: No notable events documented.

## 2025-01-16 ENCOUNTER — Encounter (HOSPITAL_COMMUNITY): Payer: Self-pay | Admitting: General Surgery

## 2025-01-22 ENCOUNTER — Ambulatory Visit: Admitting: Family Medicine

## 2025-01-22 ENCOUNTER — Encounter: Admitting: General Surgery

## 2025-01-26 ENCOUNTER — Ambulatory Visit: Payer: Self-pay | Admitting: Family Medicine

## 2025-02-17 ENCOUNTER — Inpatient Hospital Stay: Admitting: Oncology

## 2025-02-26 ENCOUNTER — Inpatient Hospital Stay: Attending: Oncology | Admitting: Oncology

## 2025-09-01 ENCOUNTER — Ambulatory Visit: Payer: Self-pay
